# Patient Record
Sex: Female | Born: 1955
Health system: Southern US, Community
[De-identification: ages and names within clinical notes are randomized; demographics above are authoritative.]

## PROBLEM LIST (undated history)

## (undated) DIAGNOSIS — E785 Hyperlipidemia, unspecified: Secondary | ICD-10-CM

## (undated) DIAGNOSIS — Z860100 Personal history of colon polyps, unspecified: Secondary | ICD-10-CM

## (undated) DIAGNOSIS — K219 Gastro-esophageal reflux disease without esophagitis: Secondary | ICD-10-CM

## (undated) DIAGNOSIS — R3915 Urgency of urination: Secondary | ICD-10-CM

## (undated) DIAGNOSIS — F329 Major depressive disorder, single episode, unspecified: Secondary | ICD-10-CM

## (undated) DIAGNOSIS — C801 Malignant (primary) neoplasm, unspecified: Secondary | ICD-10-CM

## (undated) DIAGNOSIS — E079 Disorder of thyroid, unspecified: Secondary | ICD-10-CM

## (undated) DIAGNOSIS — G8929 Other chronic pain: Secondary | ICD-10-CM

## (undated) DIAGNOSIS — I10 Essential (primary) hypertension: Secondary | ICD-10-CM

## (undated) DIAGNOSIS — R351 Nocturia: Secondary | ICD-10-CM

## (undated) DIAGNOSIS — C3491 Malignant neoplasm of unspecified part of right bronchus or lung: Secondary | ICD-10-CM

## (undated) DIAGNOSIS — F32A Depression, unspecified: Secondary | ICD-10-CM

## (undated) DIAGNOSIS — G4700 Insomnia, unspecified: Secondary | ICD-10-CM

## (undated) DIAGNOSIS — R350 Frequency of micturition: Secondary | ICD-10-CM

## (undated) HISTORY — PX: ABDOMINAL HYSTERECTOMY: SHX81

## (undated) HISTORY — DX: Gastro-esophageal reflux disease without esophagitis: K21.9

## (undated) HISTORY — DX: Essential (primary) hypertension: I10

## (undated) HISTORY — PX: COLONOSCOPY: SHX174

## (undated) HISTORY — PX: HALLUX VALGUS CORRECTION: SUR315

## (undated) HISTORY — DX: Malignant neoplasm of unspecified part of right bronchus or lung: C34.91

## (undated) HISTORY — PX: ABDOMINAL SURGERY: SHX537

## (undated) HISTORY — PX: VAGINA RECONSTRUCTION SURGERY: SHX828

## (undated) HISTORY — PX: ESOPHAGOGASTRODUODENOSCOPY: SHX1529

## (undated) HISTORY — DX: Disorder of thyroid, unspecified: E07.9

## (undated) HISTORY — DX: Other chronic pain: G89.29

## (undated) HISTORY — DX: Hyperlipidemia, unspecified: E78.5

---

## 2011-11-07 ENCOUNTER — Other Ambulatory Visit (HOSPITAL_COMMUNITY): Payer: Self-pay | Admitting: Urology

## 2011-11-12 ENCOUNTER — Ambulatory Visit (HOSPITAL_COMMUNITY)
Admission: RE | Admit: 2011-11-12 | Discharge: 2011-11-12 | Disposition: A | Discharge status: Home / Self Care | Payer: Managed Care, Other (non HMO) | Source: Ambulatory Visit | Attending: Urology | Admitting: Urology

## 2011-11-12 ENCOUNTER — Encounter (HOSPITAL_COMMUNITY): Payer: Self-pay

## 2011-11-12 DIAGNOSIS — R319 Hematuria, unspecified: Secondary | ICD-10-CM | POA: Insufficient documentation

## 2011-11-12 DIAGNOSIS — R9389 Abnormal findings on diagnostic imaging of other specified body structures: Secondary | ICD-10-CM | POA: Insufficient documentation

## 2011-11-12 DIAGNOSIS — R109 Unspecified abdominal pain: Secondary | ICD-10-CM | POA: Insufficient documentation

## 2011-11-12 DIAGNOSIS — D3500 Benign neoplasm of unspecified adrenal gland: Secondary | ICD-10-CM | POA: Insufficient documentation

## 2011-11-12 MED ORDER — IOHEXOL 300 MG/ML  SOLN
125.0000 mL | Freq: Once | INTRAMUSCULAR | Status: AC | PRN
  Administered 2011-11-12: 125 mL via INTRAVENOUS

## 2011-11-19 ENCOUNTER — Encounter (HOSPITAL_COMMUNITY): Payer: Self-pay | Admitting: Pharmacy Technician

## 2011-11-19 NOTE — Patient Instructions (Addendum)
[name]  11/19/2011   Your procedure is scheduled on:   11/22/2011  Report to [facility] at  730  AM.  Call this number if you have problems the morning of surgery: [phone]   Remember:   Do not eat food:After Midnight.  May have clear liquids:until Midnight .  Clear liquids include soda, tea, black coffee, apple or grape juice, broth.  Take these medicines the morning of surgery with A SIP OF WATER: lisinopril   Do not wear jewelry, make-up or nail polish.  Do not wear lotions, powders, or perfumes. You may wear deodorant.  Do not shave 48 hours prior to surgery.  Do not bring valuables to the hospital.  Contacts, dentures or bridgework may not be worn into surgery.  Leave suitcase in the car. After surgery it may be brought to your room.  For patients admitted to the hospital, checkout time is 11:00 AM the day of discharge.   Patients discharged the day of surgery will not be allowed to drive home.  Name and phone number of your driver: family  Special Instructions: CHG Shower Use Special Wash: 1/2 bottle night before surgery and 1/2 bottle morning of surgery.   Please read over the following fact sheets that you were given: Coughing and Deep Breathing, MRSA Information, Surgical Site Infection Prevention, Anesthesia Post-op Instructions and Care and Recovery After Surgery PATIENT INSTRUCTIONS POST-ANESTHESIA  IMMEDIATELY FOLLOWING SURGERY:  Do not drive or operate machinery for the first twenty four hours after surgery.  Do not make any important decisions for twenty four hours after surgery or while taking narcotic pain medications or sedatives.  If you develop intractable nausea and vomiting or a severe headache please notify your doctor immediately.  FOLLOW-UP:  Please make an appointment with your surgeon as instructed. You do not need to follow up with anesthesia unless specifically instructed to do so.  WOUND CARE INSTRUCTIONS (if applicable):  Keep a dry clean  dressing on the anesthesia/puncture wound site if there is drainage.  Once the wound has quit draining you may leave it open to air.  Generally you should leave the bandage intact for twenty four hours unless there is drainage.  If the epidural site drains for more than 36-48 hours please call the anesthesia department.  QUESTIONS?:  Please feel free to call your physician or the hospital operator if you have any questions, and they will be happy to assist you.     [facility] Anesthesia Department [address] [place] [phone]

## 2011-11-20 ENCOUNTER — Encounter (HOSPITAL_COMMUNITY): Payer: Self-pay

## 2011-11-20 ENCOUNTER — Other Ambulatory Visit: Payer: Self-pay

## 2011-11-20 ENCOUNTER — Encounter (HOSPITAL_COMMUNITY)
Admission: RE | Admit: 2011-11-20 | Discharge: 2011-11-20 | Disposition: A | Discharge status: Home / Self Care | Payer: Managed Care, Other (non HMO) | Source: Ambulatory Visit | Attending: Urology | Admitting: Urology

## 2011-11-20 LAB — BASIC METABOLIC PANEL WITH GFR
BUN: 20 mg/dL (ref 6–23)
CO2: 25 meq/L (ref 19–32)
Calcium: 9.5 mg/dL (ref 8.4–10.5)
Chloride: 104 meq/L (ref 96–112)
Creatinine, Ser: 0.58 mg/dL (ref 0.50–1.10)
GFR calc Af Amer: >90 mL/min (ref >90)
GFR calc non Af Amer: >90 mL/min (ref >90)
Glucose, Bld: 156 mg/dL — ABNORMAL HIGH (ref 70–99)
Potassium: 4.2 meq/L (ref 3.5–5.1)
Sodium: 139 meq/L (ref 135–145)

## 2011-11-20 LAB — HEMOGLOBIN AND HEMATOCRIT, BLOOD
HCT: 40.8 % (ref 36.0–46.0)
Hemoglobin: 13.3 g/dL (ref 12.0–15.0)

## 2011-11-20 LAB — SURGICAL PCR SCREEN
MRSA, PCR: NEGATIVE
Staphylococcus aureus: NEGATIVE

## 2011-11-20 NOTE — Pre-Procedure Instructions (Signed)
Patient has letter from attorney stating how to process removed tissue for preservation. Letter shown to C. Annabell Howells RN, copy also in chart.

## 2011-11-22 ENCOUNTER — Inpatient Hospital Stay (HOSPITAL_COMMUNITY): Payer: Managed Care, Other (non HMO) | Admitting: Anesthesiology

## 2011-11-22 ENCOUNTER — Encounter (HOSPITAL_COMMUNITY)
Admission: RE | Disposition: A | Discharge status: Home / Self Care | Payer: Self-pay | Source: Ambulatory Visit | Attending: Urology

## 2011-11-22 ENCOUNTER — Ambulatory Visit (HOSPITAL_COMMUNITY)
Admission: RE | Admit: 2011-11-22 | Discharge: 2011-11-23 | Disposition: A | Discharge status: Home / Self Care | Payer: Managed Care, Other (non HMO) | Source: Ambulatory Visit | Attending: Urology | Admitting: Urology

## 2011-11-22 ENCOUNTER — Encounter (HOSPITAL_COMMUNITY): Payer: Self-pay | Admitting: Anesthesiology

## 2011-11-22 ENCOUNTER — Encounter (HOSPITAL_COMMUNITY): Payer: Self-pay | Admitting: *Deleted

## 2011-11-22 DIAGNOSIS — Z01812 Encounter for preprocedural laboratory examination: Secondary | ICD-10-CM | POA: Insufficient documentation

## 2011-11-22 DIAGNOSIS — Z79899 Other long term (current) drug therapy: Secondary | ICD-10-CM | POA: Insufficient documentation

## 2011-11-22 DIAGNOSIS — T8389XA Other specified complication of genitourinary prosthetic devices, implants and grafts, initial encounter: Secondary | ICD-10-CM | POA: Insufficient documentation

## 2011-11-22 DIAGNOSIS — N210 Calculus in bladder: Principal | ICD-10-CM | POA: Insufficient documentation

## 2011-11-22 DIAGNOSIS — I10 Essential (primary) hypertension: Secondary | ICD-10-CM | POA: Insufficient documentation

## 2011-11-22 DIAGNOSIS — E119 Type 2 diabetes mellitus without complications: Secondary | ICD-10-CM | POA: Insufficient documentation

## 2011-11-22 DIAGNOSIS — Y838 Other surgical procedures as the cause of abnormal reaction of the patient, or of later complication, without mention of misadventure at the time of the procedure: Secondary | ICD-10-CM | POA: Insufficient documentation

## 2011-11-22 DIAGNOSIS — Z01810 Encounter for preprocedural cardiovascular examination: Secondary | ICD-10-CM | POA: Insufficient documentation

## 2011-11-22 HISTORY — PX: CYSTOSTOMY: SHX155

## 2011-11-22 LAB — GLUCOSE, CAPILLARY
Glucose-Capillary: 151 mg/dL — ABNORMAL HIGH (ref 70–99)
Glucose-Capillary: 139 mg/dL — ABNORMAL HIGH (ref 70–99)
Glucose-Capillary: 176 mg/dL — ABNORMAL HIGH (ref 70–99)
Glucose-Capillary: 145 mg/dL — ABNORMAL HIGH (ref 70–99)

## 2011-11-22 SURGERY — TRANSVAGINAL TAPE (TVT) REMOVAL
Anesthesia: General | Site: Bladder | Wound class: Clean Contaminated

## 2011-11-22 MED ORDER — GLYCOPYRROLATE 0.2 MG/ML IJ SOLN
INTRAMUSCULAR | Status: AC
  Filled 2011-11-22: qty 1

## 2011-11-22 MED ORDER — STERILE WATER FOR IRRIGATION IR SOLN
Status: DC | PRN
  Administered 2011-11-22: 1000 mL

## 2011-11-22 MED ORDER — HYDROMORPHONE HCL PF 1 MG/ML IJ SOLN
1.0000 mg | INTRAMUSCULAR | Status: DC | PRN
  Administered 2011-11-22: 1 mg via INTRAVENOUS
  Filled 2011-11-22: qty 1

## 2011-11-22 MED ORDER — MIDAZOLAM HCL 2 MG/2ML IJ SOLN
1.0000 mg | INTRAMUSCULAR | Status: DC | PRN
  Administered 2011-11-22: 2 mg via INTRAVENOUS

## 2011-11-22 MED ORDER — CIPROFLOXACIN IN D5W 200 MG/100ML IV SOLN
INTRAVENOUS | Status: AC
  Filled 2011-11-22: qty 100

## 2011-11-22 MED ORDER — CIPROFLOXACIN HCL 250 MG PO TABS
250.0000 mg | ORAL_TABLET | Freq: Two times a day (BID) | ORAL | Status: DC
  Administered 2011-11-22 – 2011-11-23 (×2): 250 mg via ORAL
  Filled 2011-11-22 (×2): qty 1

## 2011-11-22 MED ORDER — 0.9 % SODIUM CHLORIDE (POUR BTL) OPTIME
TOPICAL | Status: DC | PRN
  Administered 2011-11-22: 1000 mL

## 2011-11-22 MED ORDER — METFORMIN HCL 500 MG PO TABS
500.0000 mg | ORAL_TABLET | Freq: Two times a day (BID) | ORAL | Status: DC
  Administered 2011-11-22 – 2011-11-23 (×3): 500 mg via ORAL
  Filled 2011-11-22 (×3): qty 1

## 2011-11-22 MED ORDER — ROCURONIUM BROMIDE 50 MG/5ML IV SOLN
INTRAVENOUS | Status: AC
  Filled 2011-11-22: qty 1

## 2011-11-22 MED ORDER — FENTANYL CITRATE 0.05 MG/ML IJ SOLN
INTRAMUSCULAR | Status: AC
  Filled 2011-11-22: qty 5

## 2011-11-22 MED ORDER — CIPROFLOXACIN IN D5W 200 MG/100ML IV SOLN
200.0000 mg | Freq: Two times a day (BID) | INTRAVENOUS | Status: AC
  Administered 2011-11-22: 200 mg via INTRAVENOUS
  Filled 2011-11-22: qty 100

## 2011-11-22 MED ORDER — BUPIVACAINE IN DEXTROSE 0.75-8.25 % IT SOLN
INTRATHECAL | Status: AC
  Filled 2011-11-22: qty 2

## 2011-11-22 MED ORDER — ONDANSETRON HCL 4 MG/2ML IJ SOLN
INTRAMUSCULAR | Status: AC
  Filled 2011-11-22: qty 2

## 2011-11-22 MED ORDER — LACTATED RINGERS IV SOLN
INTRAVENOUS | Status: DC
  Administered 2011-11-22 (×2): via INTRAVENOUS

## 2011-11-22 MED ORDER — LIDOCAINE HCL 1 % IJ SOLN
INTRAMUSCULAR | Status: DC | PRN
  Administered 2011-11-22: 40 mg via INTRADERMAL

## 2011-11-22 MED ORDER — SODIUM CHLORIDE 0.45 % IV SOLN
INTRAVENOUS | Status: DC
  Administered 2011-11-22 – 2011-11-23 (×3): via INTRAVENOUS

## 2011-11-22 MED ORDER — ONDANSETRON HCL 4 MG/2ML IJ SOLN: 4.0000 mg | Freq: Once | INTRAMUSCULAR | Status: DC | PRN

## 2011-11-22 MED ORDER — ONDANSETRON HCL 4 MG/2ML IJ SOLN
4.0000 mg | Freq: Once | INTRAMUSCULAR | Status: AC
  Administered 2011-11-22: 4 mg via INTRAVENOUS

## 2011-11-22 MED ORDER — CIPROFLOXACIN IN D5W 400 MG/200ML IV SOLN
INTRAVENOUS | Status: DC | PRN
  Administered 2011-11-22: 200 mg via INTRAVENOUS

## 2011-11-22 MED ORDER — BACITRACIN-NEOMYCIN-POLYMYXIN 400-5-5000 EX OINT
TOPICAL_OINTMENT | CUTANEOUS | Status: AC
  Filled 2011-11-22: qty 2

## 2011-11-22 MED ORDER — FENTANYL CITRATE 0.05 MG/ML IJ SOLN: 25.0000 ug | INTRAMUSCULAR | Status: DC | PRN

## 2011-11-22 MED ORDER — FENTANYL CITRATE 0.05 MG/ML IJ SOLN
INTRAMUSCULAR | Status: DC | PRN
  Administered 2011-11-22: 50 ug via INTRAVENOUS
  Administered 2011-11-22: 100 ug via INTRAVENOUS
  Administered 2011-11-22 (×2): 50 ug via INTRAVENOUS
  Administered 2011-11-22 (×2): 100 ug via INTRAVENOUS
  Administered 2011-11-22 (×3): 50 ug via INTRAVENOUS

## 2011-11-22 MED ORDER — NEOSTIGMINE METHYLSULFATE 1 MG/ML IJ SOLN
INTRAMUSCULAR | Status: DC | PRN
  Administered 2011-11-22: 3 mg via INTRAVENOUS

## 2011-11-22 MED ORDER — PROPOFOL 10 MG/ML IV BOLUS
INTRAVENOUS | Status: DC | PRN
  Administered 2011-11-22: 160 mg via INTRAVENOUS

## 2011-11-22 MED ORDER — MIDAZOLAM HCL 5 MG/5ML IJ SOLN
INTRAMUSCULAR | Status: DC | PRN
  Administered 2011-11-22: 2 mg via INTRAVENOUS

## 2011-11-22 MED ORDER — MIDAZOLAM HCL 2 MG/2ML IJ SOLN
INTRAMUSCULAR | Status: AC
  Filled 2011-11-22: qty 2

## 2011-11-22 MED ORDER — LISINOPRIL 5 MG PO TABS
5.0000 mg | ORAL_TABLET | Freq: Every day | ORAL | Status: DC
  Administered 2011-11-22 – 2011-11-23 (×2): 5 mg via ORAL
  Filled 2011-11-22 (×2): qty 1

## 2011-11-22 MED ORDER — PROPOFOL 10 MG/ML IV EMUL
INTRAVENOUS | Status: AC
  Filled 2011-11-22: qty 20

## 2011-11-22 MED ORDER — GLYCOPYRROLATE 0.2 MG/ML IJ SOLN
INTRAMUSCULAR | Status: DC | PRN
  Administered 2011-11-22: 0.6 mg via INTRAVENOUS

## 2011-11-22 MED ORDER — ROCURONIUM BROMIDE 100 MG/10ML IV SOLN
INTRAVENOUS | Status: DC | PRN
  Administered 2011-11-22 (×2): 10 mg via INTRAVENOUS
  Administered 2011-11-22: 20 mg via INTRAVENOUS
  Administered 2011-11-22: 40 mg via INTRAVENOUS
  Administered 2011-11-22: 10 mg via INTRAVENOUS

## 2011-11-22 MED ORDER — SODIUM CHLORIDE 0.9 % IR SOLN
Status: DC | PRN
  Administered 2011-11-22: 3000 mL

## 2011-11-22 MED ORDER — BIOTENE DRY MOUTH MT LIQD
15.0000 mL | Freq: Two times a day (BID) | OROMUCOSAL | Status: DC
  Administered 2011-11-22: 15 mL via OROMUCOSAL

## 2011-11-22 MED ORDER — ONDANSETRON HCL 4 MG/2ML IJ SOLN
4.0000 mg | Freq: Four times a day (QID) | INTRAMUSCULAR | Status: DC | PRN
  Administered 2011-11-22: 4 mg via INTRAVENOUS
  Filled 2011-11-22: qty 2

## 2011-11-22 MED ORDER — FENTANYL CITRATE 0.05 MG/ML IJ SOLN
INTRAMUSCULAR | Status: AC
  Filled 2011-11-22: qty 2

## 2011-11-22 MED ORDER — CIPROFLOXACIN IN D5W 200 MG/100ML IV SOLN: 200.0000 mg | Freq: Once | INTRAVENOUS | Status: DC

## 2011-11-22 SURGICAL SUPPLY — 59 items
BAG DRAIN URO TABLE W/ADPT NS (DRAPE) IMPLANT
BAG URINE DRAINAGE (UROLOGICAL SUPPLIES) ×2 IMPLANT
BLADE SURG SZ10 CARB STEEL (BLADE) ×2 IMPLANT
CATH FOLEY 2WAY SLVR  5CC 18FR (CATHETERS) ×1
CATH FOLEY 2WAY SLVR  5CC 20FR (CATHETERS)
CATH FOLEY 2WAY SLVR 5CC 18FR (CATHETERS) ×1 IMPLANT
CATH FOLEY 2WAY SLVR 5CC 20FR (CATHETERS) IMPLANT
CLOTH BEACON ORANGE TIMEOUT ST (SAFETY) ×2 IMPLANT
CONNECTOR 5 IN 1 STRAIGHT STRL (MISCELLANEOUS) ×4 IMPLANT
COVER SURGICAL LIGHT HANDLE (MISCELLANEOUS) ×4 IMPLANT
DRAIN WOUND SHIRLEY 16F DUAL (DRAIN) ×2 IMPLANT
DRAPE PROXIMA HALF (DRAPES) ×2 IMPLANT
ELECT BLADE 6 FLAT ULTRCLN (ELECTRODE) ×2 IMPLANT
ELECT REM PT RETURN 9FT ADLT (ELECTROSURGICAL) ×2
ELECTRODE REM PT RTRN 9FT ADLT (ELECTROSURGICAL) ×1 IMPLANT
GLOVE BIO SURGEON STRL SZ7 (GLOVE) ×2 IMPLANT
GLOVE BIO SURGEON STRL SZ7.5 (GLOVE) ×2 IMPLANT
GLOVE BIOGEL PI IND STRL 7.0 (GLOVE) ×2 IMPLANT
GLOVE BIOGEL PI IND STRL 7.5 (GLOVE) ×1 IMPLANT
GLOVE BIOGEL PI INDICATOR 7.0 (GLOVE) ×2
GLOVE BIOGEL PI INDICATOR 7.5 (GLOVE) ×1
GLOVE ECLIPSE 6.5 STRL STRAW (GLOVE) ×8 IMPLANT
GLOVE ECLIPSE 7.0 STRL STRAW (GLOVE) ×4 IMPLANT
GLOVE EXAM NITRILE MD LF STRL (GLOVE) ×2 IMPLANT
GOWN STRL REIN XL XLG (GOWN DISPOSABLE) ×8 IMPLANT
INST SET MINOR GENERAL (KITS) ×2 IMPLANT
IV NS IRRIG 3000ML ARTHROMATIC (IV SOLUTION) ×2 IMPLANT
KIT ROOM TURNOVER AP CYSTO (KITS) ×2 IMPLANT
LUBRICANT JELLY 4.5OZ STERILE (MISCELLANEOUS) ×2 IMPLANT
MANIFOLD NEPTUNE II (INSTRUMENTS) ×2 IMPLANT
NEEDLE HYPO 18GX1.5 BLUNT FILL (NEEDLE) ×2 IMPLANT
NEEDLE HYPO 22GX1.5 SAFETY (NEEDLE) IMPLANT
NS IRRIG 1000ML POUR BTL (IV SOLUTION) ×2 IMPLANT
PACK PERI GYN (CUSTOM PROCEDURE TRAY) ×2 IMPLANT
PAD ARMBOARD 7.5X6 YLW CONV (MISCELLANEOUS) ×2 IMPLANT
SET BASIN LINEN APH (SET/KITS/TRAYS/PACK) ×2 IMPLANT
SET IRRIG Y TYPE TUR BLADDER L (SET/KITS/TRAYS/PACK) ×2 IMPLANT
SPONGE GAUZE 4X4 12PLY (GAUZE/BANDAGES/DRESSINGS) ×2 IMPLANT
SPONGE INTESTINAL PEANUT (DISPOSABLE) ×2 IMPLANT
SPONGE LAP 18X18 X RAY DECT (DISPOSABLE) ×6 IMPLANT
STAPLER VISISTAT (STAPLE) ×2 IMPLANT
STAPLER VISISTAT 35W (STAPLE) ×2 IMPLANT
SUT CHROMIC 3 0 SH 27 (SUTURE) ×2 IMPLANT
SUT PLAIN 2 0 XLH (SUTURE) ×2 IMPLANT
SUT SILK 0 FSL (SUTURE) ×2 IMPLANT
SUT VIC AB 0 CT1 27 (SUTURE) ×2
SUT VIC AB 0 CT1 27XCR 8 STRN (SUTURE) ×2 IMPLANT
SUT VIC AB 2-0 CT1 27 (SUTURE) ×6
SUT VIC AB 2-0 CT1 TAPERPNT 27 (SUTURE) ×6 IMPLANT
SUT VIC AB 2-0 CT2 27 (SUTURE) ×12 IMPLANT
SUT VIC AB 3-0 SH 27 (SUTURE) ×2
SUT VIC AB 3-0 SH 27X BRD (SUTURE) ×2 IMPLANT
SYR BULB IRRIGATION 50ML (SYRINGE) ×2 IMPLANT
SYR CONTROL 10ML LL (SYRINGE) IMPLANT
SYRINGE 10CC LL (SYRINGE) ×2 IMPLANT
TAPE CLOTH SURG 4X10 WHT LF (GAUZE/BANDAGES/DRESSINGS) ×2 IMPLANT
WATER STERILE IRR 1000ML POUR (IV SOLUTION) ×2 IMPLANT
YANKAUER SUCT 12FT TUBE ARGYLE (SUCTIONS) ×2 IMPLANT
YANKAUER SUCT BULB TIP NO VENT (SUCTIONS) ×2 IMPLANT

## 2011-11-22 NOTE — H&P (Signed)
[name], [name]              ACCOUNT NO.:  [id number]  MEDICAL RECORD NO.:  [id number]  LOCATION:                                 FACILITY:  PHYSICIAN:  Ky Barban, M.D.DATE OF BIRTH:  02/08/1955  DATE OF ADMISSION:  11/22/2011 DATE OF DISCHARGE:  LH                             HISTORY & PHYSICAL   CHIEF COMPLAINT:  Bladder stone.  HISTORY:  A 56 year old female had tension-free vaginal tape done in [place] in 2006 and it was done for stress incontinence.  She does not have any stress incontinence anymore.  She has been seeing gross blood in her urine and having severe dysuria, feels discomfort in the back and the vaginal area on and off.  It used to be on and off, now it is continuous.  For the last couple of months, she is very miserable.  She feels a lot of pressure in the pelvic.  Her gynecologist have checked her a couple of years ago and that was her last examination.  He told her to see a urologist.  She does not have any stress incontinence anymore.  No fever or chills.  I did a cystoscopy in the office.  There is a stone sticking to her left bladder wall, and I supposed that left limb of the tension-free tape has eroded into the bladder and there is a calcium deposit.  It is a rather large stone.  It is almost 2 cm.  So I have advised her to undergo removal of this side of the limb of the tension-free vaginal tape along with the stone.  Procedure, its limitation, complications are discussed in detail.  I told her that I am going to remove just the side of the tape most likely.  It should not interfere with her in stress incontinence, but there is no guarantee that it will keep working.  Also I have to open the bladder to remove this thing and there can be prolonged leakage of urine from the cystotomy site.  She understands and want me to go ahead and proceed. She is coming as outpatient to have it done under anesthesia, and I will keep her overnight in the  hospital.  PAST MEDICAL HISTORY:  Otherwise negative.  She had a hysterectomy for benign disease done in 2001.  She does have noninsulin-dependent diabetes and hypertension.  PERSONAL HISTORY:  She does not smoke or drink, use illicit drugs.  REVIEW OF SYSTEMS:  Denies any chest pain, orthopnea, PND, nausea, vomiting, fever, chills.  PHYSICAL EXAMINATION:  GENERAL:  Moderately obese female, not in acute distress. VITAL SIGNS:  Blood pressure 130/80, temperature is normal. CENTRAL NERVOUS SYSTEM:  No gross neurological deficit. HEAD, NECK, EYES, ENT:  Negative. CHEST:  Symmetrical.  Normal breath sounds. HEART:  Regular sinus rhythm.  No murmur heard. ABDOMEN:  Soft, flat.  Liver, spleen, kidneys are not palpable.  No CVA tenderness.  PELVIC:  Painful.  IMPRESSION:  Bladder stone with most likely erosion of the tension-free vaginal tape into the bladder with stone formation.  PLAN:  Removal of the left limb of the tape along with the bladder stone.     Ky Barban, M.D.  MIJ/MEDQ  D:  11/21/2011  T:  11/21/2011  Job:  [id number]

## 2011-11-22 NOTE — Transfer of Care (Signed)
Immediate Anesthesia Transfer of Care Note  Patient: [name]  Procedure(s) Performed: Procedure(s) (LRB): TRANSVAGINAL TAPE (TVT) REMOVAL (N/A) CYSTOSTOMY SUPRAPUBIC (N/A)  Patient Location: PACU and NICU  Anesthesia Type: General  Level of Consciousness: awake and patient cooperative  Airway & Oxygen Therapy: Patient Spontanous Breathing and Patient connected to face mask oxygen  Post-op Assessment: Report given to PACU RN, Post -op Vital signs reviewed and stable and Patient moving all extremities  Post vital signs: Reviewed and stable  Complications: No apparent anesthesia complications

## 2011-11-22 NOTE — OR Nursing (Signed)
Pt. Toe rings removed .  She had 2 gold toe rings,  Given to friend Greggory Stallion

## 2011-11-22 NOTE — Op Note (Signed)
NAMELAKEITHIA, RASOR              ACCOUNT NO.:  [id number]  MEDICAL RECORD NO.:  [id number]  LOCATION:  A327                          FACILITY:  [facility]  PHYSICIAN:  Ky Barban, M.D.DATE OF BIRTH:  02/08/1955  DATE OF PROCEDURE: DATE OF DISCHARGE:                              OPERATIVE REPORT   PREOPERATIVE DIAGNOSIS:  Foreign body bladder with stone formation.  PROCEDURE:  Excision of left limb of TVT with cystotomy and removal of the stone along with TVT, left limb.  ANESTHESIA:  General.  DESCRIPTION OF PROCEDURE:  The patient under general endotracheal anesthesia, in supine position, usual prep and drape.  A suprapubic midline incision was made about 3 inches long, carried down through the subcutaneous tissue, and the rectus sheath incised in the line of the incision, recti separated in the midline, and the bladder was filled up with 300 mL of saline through already placed Foley catheter.  After identifying the bladder, the superior part of the perivesical tissue was pushed superiorly to push the peritoneum away from the bladder.  Then with blunt dissection, I was able to feel the left limb when it comes near the back of the pubic symphysis and I held it with a long hemostat and cystotomy was made in the line of that incision between two 2-0 Vicryl stitches with the help of the cautery.  Once the bladder was opened up, inside of the bladder was inspected.  Then I can see there is a large stone sitting.  It is stuck to the left bladder wall.  I carried the cystotomy towards the stone and I had already divided the attachment of the left limb of the TVT to the bladder and it was being held by [facility] clamp.  With blunt and sharp dissection going around the area where the stone is, cystotomy incision was carried around the TVT where it is entering into the bladder.  Once the incision was completed, the stone along the TVT was removed and easily I can see the stone is  formed on the TVT.  At this point, the kidney cystotomy site was closed with and will put interrupted sutures of 2-0 Vicryl, starting from the most inferior part of the cystotomy.  It was carefully closed.  The cystotomy was closed with interrupted 2-0 Vicryl stitches and at the end few stitches, I had to run the stitch to close the cystotomy and the bladder was filled up again to see if there is any leak. There was no major leak from the cystotomy site.  There was some oozing here and there which was closed by the putting some more additional stitches to close it water tight.  Once the water tight closure was obtained, the perivesical tissue was approximated with couple of 3 stitches of 2-0 Vicryl and bladder was drained completely and it was irrigated.  The retropubic space was irrigated with saline.  There was no bleeding and there was no leakage of urine from the bladder.  The Foley catheter was draining fine.  Then I closed the rectus sheath with interrupted sutures of 2-0 Vicryl.  Before closing it, I had placed a Shirley sump in the retropubic space next to  the bladder coming out through a separate stab wound and stabilized to the skin with a silk stitch.  Rectus sheath was closed with interrupted sutures of 2-0 Vicryl.  Subcutaneous tissue was closed with 3-0 plain interrupted sutures.  The wound was thoroughly irrigated.  The subcutaneous tissue was irrigated with saline before closing it.  Skin was approximated with staples.  Sterile gauze dressing applied.  I had applied Neosporin to the incision.  The patient was not bleeding.  The Foley catheter was draining.  Shirley sump was working fine.  All the instrument, needle sponge count was correct.  At this time, sterile gauze dressing was applied.  The patient left the operating room in satisfactory condition.     Ky Barban, M.D.     MIJ/MEDQ  D:  11/22/2011  T:  11/22/2011  Job:  [id number]

## 2011-11-22 NOTE — Anesthesia Postprocedure Evaluation (Signed)
  Anesthesia Post-op Note  Patient: [name]  Procedure(s) Performed: Procedure(s) (LRB): TRANSVAGINAL TAPE (TVT) REMOVAL (N/A) CYSTOSTOMY SUPRAPUBIC (N/A)  Patient Location: PACU  Anesthesia Type: General  Level of Consciousness: awake, alert , oriented and patient cooperative  Airway and Oxygen Therapy: Patient Spontanous Breathing  Post-op Pain: 3 /10, mild  Post-op Assessment: Post-op Vital signs reviewed, Patient's Cardiovascular Status Stable, Respiratory Function Stable, Patent Airway, No signs of Nausea or vomiting and Pain level controlled  Post-op Vital Signs: Reviewed and stable  Complications: No apparent anesthesia complications

## 2011-11-22 NOTE — Progress Notes (Signed)
No change in H&P on reexamination. 

## 2011-11-22 NOTE — Anesthesia Preprocedure Evaluation (Addendum)
Anesthesia Evaluation  Patient identified by MRN, date of birth, ID band Patient awake    Reviewed: Allergy & Precautions, H&P , NPO status , Patient's Chart, lab work & pertinent test results  History of Anesthesia Complications Negative for: history of anesthetic complications  Airway Mallampati: III TM Distance: >3 FB Neck ROM: Full    Dental  (+) Teeth Intact   Pulmonary neg pulmonary ROS, former smoker breath sounds clear to auscultation        Cardiovascular hypertension, Pt. on medications Rhythm:Regular     Neuro/Psych    GI/Hepatic   Endo/Other  Diabetes mellitus-, Well Controlled, Type 2, Oral Hypoglycemic Agents  Renal/GU      Musculoskeletal   Abdominal   Peds  Hematology   Anesthesia Other Findings   Reproductive/Obstetrics                           Anesthesia Physical Anesthesia Plan  ASA: II  Anesthesia Plan: General   Post-op Pain Management:    Induction: Intravenous  Airway Management Planned: Oral ETT  Additional Equipment:   Intra-op Plan:   Post-operative Plan: Extubation in OR  Informed Consent: I have reviewed the patients History and Physical, chart, labs and discussed the procedure including the risks, benefits and alternatives for the proposed anesthesia with the patient or authorized representative who has indicated his/her understanding and acceptance.     Plan Discussed with:   Anesthesia Plan Comments:         Anesthesia Quick Evaluation

## 2011-11-22 NOTE — OR Nursing (Signed)
Procedure comment: Exploration & Removal of stone formation & left limb of Transvaginal tape per MD. Specimen sent to Valene Bors in lab; Lab/[facility] is sending specimen in 10% formalin per instructions from attorney's office to:  [employer] RE: Sherryle Lis [facility] [address] [place] [address]

## 2011-11-22 NOTE — Brief Op Note (Signed)
11/22/2011  11:47 AM  PATIENT:  [name]  56 y.o. female  PRE-OPERATIVE DIAGNOSIS:  erosion TVT   POST-OPERATIVE DIAGNOSIS:  * No post-op diagnosis entered *  PROCEDURE:  Procedure(s) (LRB): TRANSVAGINAL TAPE (TVT) REMOVAL (N/A) CYSTOSTOMY SUPRAPUBIC (N/A)  SURGEON:  Surgeon(s) and Role:    * Ky Barban, MD - Primary  PHYSICIAN ASSISTANT:   ASSISTANTS: none   ANESTHESIA:   general  EBL:  Total I/O In: 1500 [I.V.:1500] Out: -   BLOOD ADMINISTERED:none  DRAINS: Urinary Catheter (Foley)   LOCAL MEDICATIONS USED:  NONE  SPECIMEN:  Source of Specimen:  l limb of tvt with stone  DISPOSITION OF SPECIMEN:  PATHOLOGY  COUNTS:  YES  TOURNIQUET:  * No tourniquets in log *  DICTATION: .Other Dictation: Dictation Number [id number]  PLAN OF CARE: Admit for overnight observation  PATIENT DISPOSITION:  PACU - hemodynamically stable.   Delay start of Pharmacological VTE agent (>24hrs) due to surgical blood loss or risk of bleeding:

## 2011-11-22 NOTE — Anesthesia Procedure Notes (Signed)
Procedure Name: Intubation Date/Time: 11/22/2011 9:24 AM Performed by: Despina Hidden Pre-anesthesia Checklist: Suction available, Patient being monitored, Emergency Drugs available, Patient identified and Timeout performed Patient Re-evaluated:Patient Re-evaluated prior to inductionOxygen Delivery Method: Circle system utilized Preoxygenation: Pre-oxygenation with 100% oxygen Intubation Type: IV induction and Cricoid Pressure applied Ventilation: Mask ventilation without difficulty Laryngoscope Size: Mac and 3 Grade View: Grade II Tube type: Oral Tube size: 7.0 mm Number of attempts: 1 Airway Equipment and Method: Stylet and Patient positioned with wedge pillow Placement Confirmation: ETT inserted through vocal cords under direct vision,  positive ETCO2 and breath sounds checked- equal and bilateral Secured at: 20 cm Tube secured with: Tape Dental Injury: Teeth and Oropharynx as per pre-operative assessment

## 2011-11-23 ENCOUNTER — Encounter (HOSPITAL_COMMUNITY): Payer: Self-pay | Admitting: Urology

## 2011-11-23 LAB — GLUCOSE, CAPILLARY
Glucose-Capillary: 154 mg/dL — ABNORMAL HIGH (ref 70–99)
Glucose-Capillary: 154 mg/dL — ABNORMAL HIGH (ref 70–99)

## 2011-11-23 LAB — BASIC METABOLIC PANEL
BUN: 11 mg/dL (ref 6–23)
CO2: 28 mEq/L (ref 19–32)
Calcium: 9.2 mg/dL (ref 8.4–10.5)
Chloride: 104 mEq/L (ref 96–112)
Creatinine, Ser: 0.71 mg/dL (ref 0.50–1.10)
GFR calc Af Amer: >90 mL/min (ref >90)
GFR calc non Af Amer: >90 mL/min (ref >90)
Glucose, Bld: 145 mg/dL — ABNORMAL HIGH (ref 70–99)
Potassium: 4.2 mEq/L (ref 3.5–5.1)
Sodium: 139 mEq/L (ref 135–145)

## 2011-11-23 LAB — CBC
HCT: 40.5 % (ref 36.0–46.0)
Hemoglobin: 13.1 g/dL (ref 12.0–15.0)
MCH: 26 pg (ref 26.0–34.0)
MCHC: 32.3 g/dL (ref 30.0–36.0)
MCV: 80.5 fL (ref 78.0–100.0)
Platelets: 215 10*3/uL (ref 150–400)
RBC: 5.03 MIL/uL (ref 3.87–5.11)
RDW: 14.2 % (ref 11.5–15.5)
WBC: 10.5 10*3/uL (ref 4.0–10.5)

## 2011-11-23 NOTE — Progress Notes (Signed)
IV removed, site WNL.  Pt given d/c instructions and new prescription.  Discussed home care with patient and discussed home medications, patient verbalizes understanding. F/U appointment in place with Dr Jerre Simon, pt states they will keep appointment. Discharge instructions given about foley catheter care, switching from leg bag to gravity drainage bag, emptying bags, peri care.  Also discussed incision care and care of drain site. Pt is able to return demonstration and discuss proper foley care etc. Teachback done. Pt is stable at this time. Pt taken to main entrance in wheelchair by staff member.

## 2011-11-29 ENCOUNTER — Emergency Department (HOSPITAL_COMMUNITY)
Admission: EM | Admit: 2011-11-29 | Discharge: 2011-11-29 | Disposition: A | Discharge status: Home / Self Care | Payer: Managed Care, Other (non HMO) | Attending: Emergency Medicine | Admitting: Emergency Medicine

## 2011-11-29 ENCOUNTER — Other Ambulatory Visit (HOSPITAL_COMMUNITY): Payer: Self-pay | Admitting: Urology

## 2011-11-29 ENCOUNTER — Encounter (HOSPITAL_COMMUNITY): Payer: Self-pay

## 2011-11-29 DIAGNOSIS — IMO0001 Reserved for inherently not codable concepts without codable children: Secondary | ICD-10-CM

## 2011-11-29 DIAGNOSIS — Z90710 Acquired absence of both cervix and uterus: Secondary | ICD-10-CM | POA: Insufficient documentation

## 2011-11-29 DIAGNOSIS — E119 Type 2 diabetes mellitus without complications: Secondary | ICD-10-CM | POA: Insufficient documentation

## 2011-11-29 DIAGNOSIS — T8389XA Other specified complication of genitourinary prosthetic devices, implants and grafts, initial encounter: Secondary | ICD-10-CM

## 2011-11-29 DIAGNOSIS — Z87891 Personal history of nicotine dependence: Secondary | ICD-10-CM | POA: Insufficient documentation

## 2011-11-29 DIAGNOSIS — Z4801 Encounter for change or removal of surgical wound dressing: Secondary | ICD-10-CM | POA: Insufficient documentation

## 2011-11-29 DIAGNOSIS — Z87442 Personal history of urinary calculi: Secondary | ICD-10-CM | POA: Insufficient documentation

## 2011-11-29 NOTE — Discharge Instructions (Signed)
How to Change Your Dressing Follow your doctor's instructions for how often to change the bandage (dressing). HOME CARE Before you begin:  Wash your hands with soap and water. Dry your hands with a clean towel.   Get your supplies together. Things you may need include:   Salt solution (saline).   Flexible gauze bandage.   Medicated cream.   Tape.   Gloves.   Belly (abdominal) pads.   Gauze squares.   Plastic bags.   Take pain medicine 30 minutes before the bandage change if you need it.   Take a shower before you do the first bandage change of the day.  Changing your bandage:   Take off the old bandage. Put it in a plastic bag.   Wash your hands. If someone else takes off your old bandage, they should wash their hands.   Open the supplies.   Take the cap off the salt solution.   Open the gauze. Put the gauze on the inside of the package.   Put on your gloves.   Clean your wound as told by your doctor.   If you have been told to keep your wound dry, follow those instructions.   Your doctor may tell you to do one or more of the following:   Pick up the gauze. Pour the salt solution over the gauze. Squeeze out the extra salt solution.   Put medicated cream or other medicine on your wound if you have been told to do so.   Put solution soaked gauze only in your wound, not on the skin around it.   Pack your wound loosely as told by your doctor.   Put dry gauze on your wound.   Put belly pads over the dry gauze if your bandages soak through.   Take your gloves off. Put them in the plastic bag with the old bandage. Tie the bag shut and throw it away.   Tape the bandage in place so it will not fall off. Do not wrap the tape completely around your arm or leg.   Wrap the bandage with the flexibe gauze bandage as told by your doctor.   Keep the bandage clean and dry until your next bandage change.  GET HELP RIGHT AWAY IF:   Your wound looks red.   Your wound  feels more tender or sore.   You see yellowish white fluid (pus) in the wound.   The wound smells bad.   You have a fever of 102 F (38.9 C) or higher.   The skin around your wound has a red rash that itches and burns.   You see black or yellow skin in your wound that was not there before.   You feel sick to your stomach (nausea), throw up (vomit), and feel very tired.  Document Released: 10/12/2008 Document Revised: 07/05/2011 Document Reviewed: 05/27/2011 [facility] Patient Information 2012 [place], [place].Wound Check Your wound appears healthy today. Your wound will heal gradually over time. Eventually a scar will form that will fade with time. FACTORS THAT AFFECT SCAR FORMATION:  People differ in the severity in which they scar.   Scar severity varies according to location, size, and the traits you inherited from your parents (genetic predisposition).   Irritation to the wound from infection, rubbing, or chemical exposure will increase the amount of scar formation.  HOME CARE INSTRUCTIONS   If you were given a dressing, you should change it at least once a day or as instructed by your  caregiver. If the bandage sticks, soak it off with a solution of hydrogen peroxide.   If the bandage becomes wet, dirty, or develops a bad smell, change it as soon as possible.   Look for signs of infection.   Only take over-the-counter or prescription medicines for pain, discomfort, or fever as directed by your caregiver.  SEEK IMMEDIATE MEDICAL CARE IF:   You have redness, swelling, or increasing pain in the wound.   You notice pus coming from the wound.   You have a fever.   You notice a bad smell coming from the wound or dressing.  Document Released: 04/21/2004 Document Revised: 07/05/2011 Document Reviewed: 07/16/2005 [facility] Patient Information 2012 [place], [place].

## 2011-11-29 NOTE — ED Provider Notes (Signed)
History     CSN: [id number]  Arrival date & time 11/29/11  2440   First MD Initiated Contact with Patient 11/29/11 1955      Chief Complaint  Patient presents with  . Wound Check    (Consider location/radiation/quality/duration/timing/severity/associated sxs/prior treatment) HPI Comments: Patient comes to emergency department with complaint of bleeding from a surgical incision earlier this evening. She states that she had surgery on 11/22/2011 to remove a defective bladder mesh and a stone that was attached to the bladder wall. She states she was seen by her urologist, Dr. Jerre Simon, earlier today and had the staples removed. This evening, she states she noticed a brief episode of bleeding along the incision after having a bowel movement. She states the bleeding stopped prior to arrival in the ER . She denies fever, abdominal pain, or persistent bleeding. Patient also has a Foley catheter in place but denies any difficulty with urination or leaking from her catheter.  Patient is a 56 y.o. female presenting with wound check. The history is provided by the patient.  Wound Check     Past Medical History  Diagnosis Date  . Diabetes mellitus     Past Surgical History  Procedure Date  . Abdominal hysterectomy   . Vagina reconstruction surgery     tvt 2006- [place]  . Hallux valgus correction     bilateral foot surgery for bone repairs-multiple  . Cystostomy 11/22/2011    Procedure: CYSTOSTOMY SUPRAPUBIC;  Surgeon: Ky Barban, MD;  Location: [facility];  Service: Urology;  Laterality: N/A;  . Abdominal surgery     Family History  Problem Relation Age of Onset  . Anesthesia problems Neg Hx   . Hypotension Neg Hx   . Malignant hyperthermia Neg Hx   . Pseudochol deficiency Neg Hx     History  Substance Use Topics  . Smoking status: Former Smoker -- 1.0 packs/day for 20 years    Types: Cigarettes    Quit date: 11/19/2001  . Smokeless tobacco: Former Neurosurgeon    Quit date:  10/17/2011  . Alcohol Use: No     rarely    OB History    Grav Para Term Preterm Abortions TAB SAB Ect Mult Living                  Review of Systems  Constitutional: Negative for fever and chills.  Gastrointestinal: Negative for vomiting, abdominal pain, blood in stool and abdominal distention.  Genitourinary: Negative for dysuria, hematuria and difficulty urinating.  Skin: Positive for wound.       Bleeding from the surgical wound   Neurological: Negative for weakness.  All other systems reviewed and are negative.    Allergies  Synthroid and Iodine  Home Medications   Current Outpatient Rx  Name Route Sig Dispense Refill  . LISINOPRIL 5 MG PO TABS Oral Take 5 mg by mouth every evening.     Marland Kitchen METFORMIN HCL 500 MG PO TABS Oral Take 500 mg by mouth 2 (two) times daily.    Marland Kitchen CIPROFLOXACIN HCL 250 MG PO TABS Oral Take 250 mg by mouth 2 (two) times daily.      BP 156/64  Pulse 75  Temp(Src) 97.8 F (36.6 C) (Oral)  Resp 20  Ht 5' 6.5" (1.689 m)  Wt 220 lb (99.791 kg)  BMI 34.98 kg/m2  SpO2 97%  Physical Exam  Nursing note and vitals reviewed. Constitutional: She is oriented to person, place, and time. She appears well-developed  and well-nourished. No distress.  HENT:  Head: Normocephalic and atraumatic.  Cardiovascular: Normal rate, regular rhythm and normal heart sounds.   Pulmonary/Chest: Breath sounds normal.  Abdominal: Soft. Bowel sounds are normal. She exhibits no distension. There is no tenderness. There is no rebound and no guarding.         Transverse surgical incision to the lower abdomen/suprapubic region.  Steri-Strips in place. Small , localized area of blood present at the lower portion of the incision. No active bleeding at this time. Incision appears to be healing well, no surrounding erythema, drainage, or odor. The wound does not appear to be open.  Musculoskeletal: Normal range of motion. She exhibits no edema.  Neurological: She is alert and  oriented to person, place, and time.  Skin: Skin is dry.       See abdominal exam    ED Course  Procedures (including critical care time)       MDM    The incision was cleaned with saline by me and additional Steri-Strips were applied to the wound. Bleeding is controlled. No surrounding erythema or drainage. Patient has an appointment to followup with Dr. Jerre Simon on Monday. She agrees to return to the ER for any worsening symptoms.    Patient / Family / Caregiver understand and agree with initial ED impression and plan with expectations set for ED visit. Pt stable in ED with no significant deterioration in condition. Pt feels improved after observation and/or treatment in ED.      Taha Dimond L. [place], [place] 11/29/11 2204

## 2011-11-29 NOTE — ED Provider Notes (Signed)
Medical screening examination/treatment/procedure(s) were performed by non-physician practitioner and as supervising physician I was immediately available for consultation/collaboration.  Donnetta Hutching, MD 11/29/11 2241

## 2011-11-29 NOTE — ED Notes (Signed)
Pt with complaints of bleeding in abd after surgery. Today she had staples removed and now bleeding present. Small amount of bleeding noted controlled with 4x4 held by patient.

## 2011-11-29 NOTE — ED Notes (Signed)
Pt seen and evaluated by EDPa for initial assessment. 

## 2011-12-03 ENCOUNTER — Ambulatory Visit (HOSPITAL_COMMUNITY)
Admission: RE | Admit: 2011-12-03 | Discharge: 2011-12-03 | Disposition: A | Discharge status: Home / Self Care | Payer: Managed Care, Other (non HMO) | Source: Ambulatory Visit | Attending: Urology | Admitting: Urology

## 2011-12-03 DIAGNOSIS — Z09 Encounter for follow-up examination after completed treatment for conditions other than malignant neoplasm: Secondary | ICD-10-CM | POA: Insufficient documentation

## 2011-12-03 DIAGNOSIS — T8389XA Other specified complication of genitourinary prosthetic devices, implants and grafts, initial encounter: Secondary | ICD-10-CM

## 2011-12-03 DIAGNOSIS — Z87448 Personal history of other diseases of urinary system: Secondary | ICD-10-CM | POA: Insufficient documentation

## 2012-04-03 ENCOUNTER — Ambulatory Visit (INDEPENDENT_AMBULATORY_CARE_PROVIDER_SITE_OTHER): Payer: Managed Care, Other (non HMO) | Admitting: Otolaryngology

## 2012-04-03 DIAGNOSIS — R040 Epistaxis: Secondary | ICD-10-CM

## 2012-05-01 ENCOUNTER — Ambulatory Visit (INDEPENDENT_AMBULATORY_CARE_PROVIDER_SITE_OTHER): Payer: Managed Care, Other (non HMO) | Admitting: Otolaryngology

## 2012-05-01 DIAGNOSIS — J310 Chronic rhinitis: Secondary | ICD-10-CM

## 2012-05-01 DIAGNOSIS — R0982 Postnasal drip: Secondary | ICD-10-CM

## 2012-05-01 DIAGNOSIS — R040 Epistaxis: Secondary | ICD-10-CM

## 2012-05-29 ENCOUNTER — Ambulatory Visit (INDEPENDENT_AMBULATORY_CARE_PROVIDER_SITE_OTHER): Payer: Managed Care, Other (non HMO) | Admitting: Otolaryngology

## 2012-05-29 DIAGNOSIS — R040 Epistaxis: Secondary | ICD-10-CM

## 2012-06-09 ENCOUNTER — Other Ambulatory Visit (HOSPITAL_COMMUNITY): Payer: Self-pay | Admitting: Urology

## 2012-06-09 DIAGNOSIS — R310 Gross hematuria: Secondary | ICD-10-CM

## 2012-06-10 ENCOUNTER — Ambulatory Visit (HOSPITAL_COMMUNITY)
Admission: RE | Admit: 2012-06-10 | Discharge: 2012-06-10 | Disposition: A | Discharge status: Home / Self Care | Payer: Managed Care, Other (non HMO) | Source: Ambulatory Visit | Attending: Urology | Admitting: Urology

## 2012-06-10 DIAGNOSIS — R310 Gross hematuria: Secondary | ICD-10-CM

## 2012-06-10 DIAGNOSIS — K7689 Other specified diseases of liver: Secondary | ICD-10-CM | POA: Insufficient documentation

## 2012-06-10 MED ORDER — IOHEXOL 300 MG/ML  SOLN
125.0000 mL | Freq: Once | INTRAMUSCULAR | Status: AC | PRN
  Administered 2012-06-10: 125 mL via INTRAVENOUS

## 2012-08-07 ENCOUNTER — Emergency Department (HOSPITAL_COMMUNITY)
Admission: EM | Admit: 2012-08-07 | Discharge: 2012-08-07 | Disposition: A | Discharge status: Home / Self Care | Payer: Managed Care, Other (non HMO) | Attending: Emergency Medicine | Admitting: Emergency Medicine

## 2012-08-07 ENCOUNTER — Emergency Department (HOSPITAL_COMMUNITY): Payer: Managed Care, Other (non HMO)

## 2012-08-07 ENCOUNTER — Encounter (HOSPITAL_COMMUNITY): Payer: Self-pay | Admitting: *Deleted

## 2012-08-07 DIAGNOSIS — Z79899 Other long term (current) drug therapy: Secondary | ICD-10-CM | POA: Insufficient documentation

## 2012-08-07 DIAGNOSIS — S6980XA Other specified injuries of unspecified wrist, hand and finger(s), initial encounter: Secondary | ICD-10-CM | POA: Insufficient documentation

## 2012-08-07 DIAGNOSIS — IMO0002 Reserved for concepts with insufficient information to code with codable children: Secondary | ICD-10-CM | POA: Insufficient documentation

## 2012-08-07 DIAGNOSIS — Z87891 Personal history of nicotine dependence: Secondary | ICD-10-CM | POA: Insufficient documentation

## 2012-08-07 DIAGNOSIS — Y9301 Activity, walking, marching and hiking: Secondary | ICD-10-CM | POA: Insufficient documentation

## 2012-08-07 DIAGNOSIS — M20019 Mallet finger of unspecified finger(s): Secondary | ICD-10-CM | POA: Insufficient documentation

## 2012-08-07 DIAGNOSIS — M20011 Mallet finger of right finger(s): Secondary | ICD-10-CM

## 2012-08-07 DIAGNOSIS — W010XXA Fall on same level from slipping, tripping and stumbling without subsequent striking against object, initial encounter: Secondary | ICD-10-CM | POA: Insufficient documentation

## 2012-08-07 DIAGNOSIS — E119 Type 2 diabetes mellitus without complications: Secondary | ICD-10-CM | POA: Insufficient documentation

## 2012-08-07 DIAGNOSIS — S6990XA Unspecified injury of unspecified wrist, hand and finger(s), initial encounter: Secondary | ICD-10-CM | POA: Insufficient documentation

## 2012-08-07 DIAGNOSIS — S8392XA Sprain of unspecified site of left knee, initial encounter: Secondary | ICD-10-CM

## 2012-08-07 DIAGNOSIS — Y929 Unspecified place or not applicable: Secondary | ICD-10-CM | POA: Insufficient documentation

## 2012-08-07 NOTE — ED Notes (Signed)
Advised of the importance of follow up for finger

## 2012-08-07 NOTE — ED Notes (Signed)
Pain and deformity of RMF and pain lt knee after fall. On ice.

## 2012-08-07 NOTE — ED Provider Notes (Signed)
History     CSN: [id number]  Arrival date & time 08/07/12  1654   First MD Initiated Contact with Patient 08/07/12 1840      Chief Complaint  Patient presents with  . Fall    (Consider location/radiation/quality/duration/timing/severity/associated sxs/prior treatment) HPI Comments: Pt  Slipped in mud and fell.  Pain to L knee and to R middle finger.  Patient is a 57 y.o. female presenting with fall. The history is provided by the patient. No language interpreter was used.  Fall The accident occurred 3 to 5 hours ago. The fall occurred while walking. She landed on dirt. The point of impact was the left knee. Pain location: R 3rd digit. She was ambulatory at the scene. There was no entrapment after the fall. There was no drug use involved in the accident. There was no alcohol use involved in the accident. Pertinent negatives include no fever and no numbness. The symptoms are aggravated by standing. She has tried nothing for the symptoms.    Past Medical History  Diagnosis Date  . Diabetes mellitus     Past Surgical History  Procedure Date  . Abdominal hysterectomy   . Vagina reconstruction surgery     tvt 2006- [place]  . Hallux valgus correction     bilateral foot surgery for bone repairs-multiple  . Cystostomy 11/22/2011    Procedure: CYSTOSTOMY SUPRAPUBIC;  Surgeon: Ky Barban, MD;  Location: [facility];  Service: Urology;  Laterality: N/A;  . Abdominal surgery     Family History  Problem Relation Age of Onset  . Anesthesia problems Neg Hx   . Hypotension Neg Hx   . Malignant hyperthermia Neg Hx   . Pseudochol deficiency Neg Hx     History  Substance Use Topics  . Smoking status: Former Smoker -- 1.0 packs/day for 20 years    Types: Cigarettes    Quit date: 11/19/2001  . Smokeless tobacco: Former Neurosurgeon    Quit date: 10/17/2011  . Alcohol Use: No     Comment: rarely    OB History    Grav Para Term Preterm Abortions TAB SAB Ect Mult Living        Review of Systems  Constitutional: Negative for fever and chills.  Musculoskeletal: Negative for joint swelling.       Knee and finger injuries.  Neurological: Negative for numbness.  All other systems reviewed and are negative.    Allergies  Synthroid and Iodine  Home Medications   Current Outpatient Rx  Name  Route  Sig  Dispense  Refill  . CIPROFLOXACIN HCL 250 MG PO TABS   Oral   Take 250 mg by mouth 2 (two) times daily.         Marland Kitchen LISINOPRIL 5 MG PO TABS   Oral   Take 5 mg by mouth every evening.          Marland Kitchen METFORMIN HCL 500 MG PO TABS   Oral   Take 500 mg by mouth 2 (two) times daily.           BP 132/51  Pulse 105  Temp 98.2 F (36.8 C) (Oral)  Resp 18  Ht 5\' 7"  (1.702 m)  Wt 220 lb (99.791 kg)  BMI 34.46 kg/m2  SpO2 98%  Physical Exam  Nursing note and vitals reviewed. Constitutional: She is oriented to person, place, and time. She appears well-developed and well-nourished. No distress.  HENT:  Head: Normocephalic and atraumatic.  Eyes: EOM are normal.  Neck: Normal range of motion.  Cardiovascular: Normal rate and regular rhythm.   Pulmonary/Chest: Effort normal.  Abdominal: Soft. She exhibits no distension. There is no tenderness.  Musculoskeletal: She exhibits tenderness.       Left knee: She exhibits decreased range of motion. She exhibits no swelling, no effusion, no ecchymosis, no deformity and no laceration. tenderness found. Lateral joint line tenderness noted.       Right hand: She exhibits decreased range of motion and tenderness. She exhibits no bony tenderness, no deformity, no laceration and no swelling. normal sensation noted. Normal strength noted.       Hands:      Legs: Neurological: She is alert and oriented to person, place, and time.  Skin: Skin is warm and dry.  Psychiatric: She has a normal mood and affect. Judgment normal.    ED Course  Procedures (including critical care time)  Labs Reviewed - No data to  display Dg Knee Complete 4 Views Left  08/07/2012  *RADIOLOGY REPORT*  Clinical Data: Pain post fall  LEFT KNEE - COMPLETE 4+ VIEW  Comparison: None.  Findings: Four views of the left knee submitted.  No acute fracture or subluxation.  Minimal narrowing of medial joint compartment. Mild narrowing of patellofemoral joint space.  Small joint effusion.  IMPRESSION: No acute fracture or subluxation.  Minimal degenerative changes.   Original Report Authenticated By: Natasha Mead, M.D.    Dg Finger Middle Right  08/07/2012  *RADIOLOGY REPORT*  Clinical Data: Fall.  Long finger pain.  Inability to extend the finger.  RIGHT MIDDLE FINGER 2+V  Comparison: None.  Findings: There are calcific fragments along the dorsal aspect of the DIP joint of the long finger.  This suggests extensor mechanism injury.  Overlying soft tissue swelling is present.  There is flexion deformity of the DIP joint.  The PIP joint appears within normal limits.  IMPRESSION: Mallet finger compatible with distal extensor tendon injury.   Original Report Authenticated By: Andreas Newport, M.D.      1. Mallet finger of right hand   2. Left knee sprain       MDM  Finger splinted in hyperextension. Pt refuses knee immobilizer.  Prefers an ace wrap. Ice, ibuprofen F/u with dr. Romeo Apple re knee F/u with dr. Merlyn Lot re finger.        Evalina Field, [place] 08/07/12 1904

## 2012-08-09 NOTE — ED Provider Notes (Signed)
Medical screening examination/treatment/procedure(s) were performed by non-physician practitioner and as supervising physician I was immediately available for consultation/collaboration. Rease Swinson, MD, FACEP   Cassandra Mcmanaman L Patryck Kilgore, MD 08/09/12 0709 

## 2012-08-12 ENCOUNTER — Ambulatory Visit (INDEPENDENT_AMBULATORY_CARE_PROVIDER_SITE_OTHER): Payer: Managed Care, Other (non HMO) | Admitting: Orthopedic Surgery

## 2012-08-12 ENCOUNTER — Ambulatory Visit (INDEPENDENT_AMBULATORY_CARE_PROVIDER_SITE_OTHER): Payer: Managed Care, Other (non HMO)

## 2012-08-12 ENCOUNTER — Encounter: Payer: Self-pay | Admitting: Orthopedic Surgery

## 2012-08-12 VITALS — BP 170/90 | Ht 67.0 in | Wt 257.0 lb

## 2012-08-12 DIAGNOSIS — S8263XA Displaced fracture of lateral malleolus of unspecified fibula, initial encounter for closed fracture: Secondary | ICD-10-CM

## 2012-08-12 DIAGNOSIS — M25473 Effusion, unspecified ankle: Secondary | ICD-10-CM

## 2012-08-12 DIAGNOSIS — S8392XA Sprain of unspecified site of left knee, initial encounter: Secondary | ICD-10-CM

## 2012-08-12 DIAGNOSIS — M25579 Pain in unspecified ankle and joints of unspecified foot: Secondary | ICD-10-CM

## 2012-08-12 DIAGNOSIS — IMO0002 Reserved for concepts with insufficient information to code with codable children: Secondary | ICD-10-CM

## 2012-08-12 MED ORDER — HYDROCODONE-ACETAMINOPHEN 5-325 MG PO TABS: 1.0000 | ORAL_TABLET | ORAL | Status: DC | PRN

## 2012-08-12 MED ORDER — DICLOFENAC SODIUM 50 MG PO TBEC: 50.0000 mg | DELAYED_RELEASE_TABLET | Freq: Two times a day (BID) | ORAL | Status: DC

## 2012-08-12 NOTE — Progress Notes (Signed)
Patient ID: JMYA ULIANO, female   DOB: 02/08/1955, 57 y.o.   MRN: [id number] Chief Complaint  Patient presents with  . Knee Pain    Left knee pain d/t fall 08/07/12 and pt fell again 08/11/12    57 year-old female fell and injured her left knee on January 9. She fell again last night injured her left ankle. She says that the knee was starting to feel a little bit better despite her medial joint line pain which is 6/10 described as sharp dull coming and going. She has pain when she twists or turns but denies locking or catching. Just lateral ankle tenderness with swelling bruising and difficulty weightbearing and decreased range of motion  Review of systems seasonal allergies all other systems were normal  Medical history as recorded  BP 170/90  Ht 5\' 7"  (1.702 m)  Wt 257 lb (116.574 kg)  BMI 40.25 kg/m2 General appearance is normal except for slightly overweight She is oriented x3 Her mood and affect are normal She is angulating with a limp because of the ankle but no assistive devices  Upper extremity exam  The right and left upper extremity:   Inspection and palpation revealed no abnormalities in the upper extremities.   Range of motion is full without contracture.  Motor exam is normal with grade 5 strength.  The joints are fully reduced without subluxation.  There is no atrophy or tremor and muscle tone is normal.  All joints are stable.   Right lower extremity inspection is normal range of motion is full stability tests are normal strength is grade 5 skin is intact and normal  Left lower extremity hip exam normal left knee medial joint line tenderness no effusion pain with McMurray's maneuver ligaments are stable strength and muscle tone grade 5 skin is normal Left ankle tenderness swelling lateral collateral ligament and lateral fibula no medial tenderness Achilles tendon palpable and intact ankle joint stable strength assessment deferred because of pain skin is  intact  Distal pulses and temperature are normal without edema swelling related to ankle sprain most likely Lymph system is negative Sensory exam is normal There are no pathologic reflexes And balance is excellent  Knee x-rays are negative for acute process  Office film left ankle to rule out fracture  Impression  The ankle x-ray shows a nondisplaced fracture of the lateral malleolus  She's placed in a air cast full weightbearing x-ray in 6 weeks   In terms of the knee she most likely has a sprained knee although medial meniscal tear is also possible. Recommend anti-inflammatories for 6 weeks and Norco for pain if no improvement then I recommend MRI left knee. She's also placed in the left knee economy hinged stabilizer brace

## 2012-09-23 ENCOUNTER — Encounter: Payer: Self-pay | Admitting: Orthopedic Surgery

## 2012-09-23 ENCOUNTER — Ambulatory Visit (INDEPENDENT_AMBULATORY_CARE_PROVIDER_SITE_OTHER): Payer: Managed Care, Other (non HMO) | Admitting: Orthopedic Surgery

## 2012-09-23 ENCOUNTER — Ambulatory Visit (INDEPENDENT_AMBULATORY_CARE_PROVIDER_SITE_OTHER): Payer: Managed Care, Other (non HMO)

## 2012-09-23 VITALS — Ht 67.0 in | Wt 257.0 lb

## 2012-09-23 DIAGNOSIS — S82892D Other fracture of left lower leg, subsequent encounter for closed fracture with routine healing: Secondary | ICD-10-CM

## 2012-09-23 DIAGNOSIS — S8262XD Displaced fracture of lateral malleolus of left fibula, subsequent encounter for closed fracture with routine healing: Secondary | ICD-10-CM

## 2012-09-23 DIAGNOSIS — S8290XD Unspecified fracture of unspecified lower leg, subsequent encounter for closed fracture with routine healing: Secondary | ICD-10-CM

## 2012-09-23 DIAGNOSIS — Z5189 Encounter for other specified aftercare: Secondary | ICD-10-CM

## 2012-09-23 DIAGNOSIS — S8392XD Sprain of unspecified site of left knee, subsequent encounter: Secondary | ICD-10-CM

## 2012-09-23 NOTE — Patient Instructions (Addendum)
Wear ankle brace x 2 more weeks    Ankle Fracture with Rehab Two bones in the ankle, the shinbone (tibia) and the bone of the outer ankle and lower leg (fibula), are susceptible to being fractured. The fracture may be a complete or an incomplete break of the bone. It is also common for the ligaments of the ankle joint to be injured at the same time as a fracture. SYMPTOMS   Severe pain in the ankle at the time of injury and/or when trying to move the ankle.  Feeling of popping or tearing in the inner or outer part of the ankle, sometimes as if the ankle joint was temporarily dislocated and popped back into place.  Cracking or other sounds may be heard at the time of fracture.  Severe tenderness in the ankle.  Swelling in the ankle and foot  Blisters around the ankle (uncommon).  Bleeding and bruising (contusion) in the ankle and foot.  Inability to stand or bear weight on the injured foot.  Visible deformity if the fracture is complete and the bone fragments separate enough to distort normal leg contours.  Numbness and coldness in the foot if the blood supply is impaired. CAUSES  Bones break when subjected to a force that is greater than the their strength. Most fractures are due to direct trauma, such as being hit with an object or falling.  Fractured may also be caused by indirect stress, such as twisting, pivoting, or violent muscle contraction . RISK INCREASES WITH:  Sports that require quick changes in direction (football, soccer, or skiing).  Sports that require jumping (basketball, volleyball, distance jumping, or high jumping).  Walking or running on uneven or rough surfaces.  Shoes with inadequate support to prevent the foot and ankle from rolling over when stress occurs.  Bony abnormalities (osteoporosis or bone tumors).  Metabolic disorders, hormone problems, and nutritional deficiencies and disorders.  Poor strength and flexibility  Previous ankle  injury. PREVENTION   Warm up and stretch properly before activity.  Maintain physical fitness:  Leg and ankle strength.  Flexibility and endurance.  Cardiovascular fitness.  Wear properly fitted protective equipment (high-top shoes or when appropriate and ankle bracing, taping, or splinting), especially for the first 12 months after an ankle injury. PROGNOSIS  If treated properly, ankle fractures typically heal well. RELATED COMPLICATIONS   Failure to heal (nonunion).  Healing in poor position (malunion).  Arrest of normal bone growth in children.  Proneness to repeated ankle injury.  Stiff ankle.  Unstable or arthritic ankle.  Infected skin blisters.  Prolonged healing time if activity is resumed too quickly. Risks of surgery, including infection, bleeding, injury to nerves (numbness, weakness, paralysis), and need for further surgery. TREATMENT  Treatment initially consists of ice and medication to help reduce pain and inflammation. The joint must be immobilized to allow for healing. If the fracture is where the bones are out of alignment (displaced), then surgery may be necessary to realign (reduce) them. Surgery usually involves placing pins and screws in the bones to hold them in place while the fracture heals. After surgery the joint is immobilized. Bone growth stimulators may be used to promote bone growth, but this is uncommon, Strengthening and stretching exercises are usually necessary after immobilization in order to regain strength and a full range of motion. These exercises may be completed at home or with a therapist. If pins and screws are placed in the bone, they are not usually removed unless they become a source  of pain. MEDICATION   If pain medication is necessary, then nonsteroidal anti-inflammatory medications, such as aspirin and ibuprofen, or other minor pain relievers, such as acetaminophen, are often recommended.  Do not take pain medication within 7  days before surgery.  Prescription pain relievers may be prescribed if deemed necessary by your caregiver. Use only as directed and only as much as you need. SEEK MEDICAL CARE IF:   Symptoms get worse or do not improve in 2 weeks despite treatment.  The following occur after immobilization or surgery:  Swelling above or below the fracture site.  Severe, persistent pain.  Blue or gray skin below the fracture site, especially under the nails, or numbness or loss of feeling below the fracture site. Report any of these signs immediately.  New, unexplained symptoms develop (drugs used in treatment may produce side effects). EXERCISES  RANGE OF MOTION (ROM) AND STRETCHING EXERCISES - Ankle Fracture These exercises may help you when beginning to rehabilitate your injury. Your symptoms may resolve with or without further involvement from your physician, physical therapist or athletic trainer. While completing these exercises, remember:   Restoring tissue flexibility helps normal motion to return to the joints. This allows healthier, less painful movement and activity.  An effective stretch should be held for at least 30 seconds.  A stretch should never be painful. You should only feel a gentle lengthening or release in the stretched tissue. RANGE OF MOTION - Dorsi/Plantar Flexion  While sitting with your right / left knee straight, draw the top of your foot upwards by flexing your ankle. Then reverse the motion, pointing your toes downward.  Hold each position for __________ seconds.  After completing your first set of exercises, repeat this exercise with your knee bent. Repeat __________ times. Complete this exercise __________ times per day.  RANGE OF MOTION- Ankle Plantar Flexion   Sit with your right / left leg crossed over your opposite knee.  Use your opposite hand to pull the top of your foot and toes toward you.  You should feel a gentle stretch on the top of your foot/ankle.  Hold this position for __________ seconds. Repeat __________ times. Complete __________ times per day.  RANGE OF MOTION - Ankle Eversion  Sit with your right / left ankle crossed over your opposite knee.  Grip your foot with your opposite hand, placing your thumb on the top of your foot and your fingers across the bottom of your foot.  Gently push your foot downward with a slight rotation so your littlest toes rise slightly  You should feel a gentle stretch on the inside of your ankle. Hold the stretch for __________ seconds. Repeat __________ times. Complete this exercise __________ times per day.  RANGE OF MOTION - Ankle Inversion  Sit with your right / left ankle crossed over your opposite knee.  Grip your foot with your opposite hand, placing your thumb on the bottom of your foot and your fingers across the top of your foot.  Gently pull your foot so the smallest toe comes toward you and your thumb pushes the inside of the ball of your foot away from you.  You should feel a gentle stretch on the outside of your ankle. Hold the stretch for __________ seconds. Repeat __________ times. Complete this exercise __________ times per day.  RANGE OF MOTION - Ankle Alphabet  Imagine your right / left big toe is a pen.  Keeping your hip and knee still, write out the entire alphabet with  your "pen." Make the letters as large as you can without increasing any discomfort. Repeat __________ times. Complete this exercise __________ times per day.  RANGE OF MOTION - Ankle Dorsiflexion, Active Assisted   Remove shoes and sit on a chair that is preferably not on a carpeted surface.  Place right / left foot under knee. Extend your opposite leg for support.  Keeping your heel down, slide your right / left foot back toward the chair until you feel a stretch at your ankle or calf. If you do not feel a stretch, slide your bottom forward to the edge of the chair, while still keeping your heel  down.  Hold this stretch for __________ seconds. Repeat __________ times. Complete this stretch __________ times per day.  STRETCH - Gastrocsoleus   Sit with your right / left leg extended. Holding onto both ends of a belt or towel, loop it around the ball of your foot.  Keeping your right / left ankle and foot relaxed and your knee straight, pull your foot and ankle toward you using the belt/towel.  You should feel a gentle stretch behind your calf or knee. Hold this position for __________ seconds. Repeat __________ times. Complete this stretch __________ times per day.  STRENGTHENING EXERCISES - Ankle Fracture These exercises may help you when beginning to rehabilitate your injury. They may resolve your symptoms with or without further involvement from your physician, physical therapist or athletic trainer. While completing these exercises, remember:   Muscles can gain both the endurance and the strength needed for everyday activities through controlled exercises.  Complete these exercises as instructed by your physician, physical therapist or athletic trainer. Progress the resistance and repetitions only as guided.  You may experience muscle soreness or fatigue, but the pain or discomfort you are trying to eliminate should never worsen during these exercises. If this pain does worsen, stop and make certain you are following the directions exactly. If the pain is still present after adjustments, discontinue the exercise until you can discuss the trouble with your clinician. STRENGTH - Dorsiflexors  Secure a rubber exercise band/tubing to a fixed object (table, pole) and loop the other end around your right / left foot.  Sit on the floor facing the fixed object. The band/tubing should be slightly tense when your foot is relaxed.  Slowly draw your foot back toward you using your ankle and toes.  Hold this position for __________ seconds. Slowly release the tension in the band and return  your foot to the starting position. Repeat __________ times. Complete this exercise __________ times per day.  STRENGTH - Plantar-flexors  Sit with your right / left leg extended. Holding onto both ends of a rubber exercise band/tubing, loop it around the ball of your foot. Keep a slight tension in the band.  Slowly push your toes away from you, pointing them downward.  Hold this position for __________ seconds. Return slowly, controlling the tension in the band/tubing. Repeat __________ times. Complete this exercise __________ times per day.  STRENGTH - Ankle Eversion  Secure one end of a rubber exercise band/tubing to a fixed object (table, pole). Loop the other end around your foot just before your toes.  Place your fists between your knees. This will focus your strengthening at your ankle.  Drawing the band/tubing across your opposite foot, slowly, pull your little toe out and up. Make sure the band/tubing is positioned to resist the entire motion.  Hold this position for __________ seconds.  Have your  muscles resist the band/tubing as it slowly pulls your foot back to the starting position. Repeat __________ times. Complete this exercise __________ times per day.  STRENGTH - Ankle Inversion  Secure one end of a rubber exercise band/tubing to a fixed object (table, pole). Loop the other end around your foot just before your toes.  Place your fists between your knees. This will focus your strengthening at your ankle.  Slowly, pull your big toe up and in, making sure the band/tubing is positioned to resist the entire motion.  Hold this position for __________ seconds.  Have your muscles resist the band/tubing as it slowly pulls your foot back to the starting position. Repeat __________ times. Complete this exercises __________ times per day.  STRENGTH - Towel Curls  Sit in a chair positioned on a non-carpeted surface.  Place your foot on a towel, keeping your heel on the  floor.  Pull the towel toward your heel by only curling your toes. Keep your heel on the floor.  If instructed by your physician, physical therapist or athletic trainer, add weight at the end of the towel. Repeat __________ times. Complete this exercise __________ times per day. STRENGTH - Plantar-flexors, Standing   Stand with your feet shoulder width apart. Steady yourself with a wall or table using as little support as needed.  Keeping your weight evenly spread over the width of your feet, rise up on your toes.*  Hold this position for __________ seconds. Repeat __________ times. Complete this exercise __________ times per day.  *If this is too easy, shift your weight toward your right / left leg until you feel challenged. Ultimately, you may be asked to do this exercise with your right / left foot only. Document Released: 02/14/2005 Document Revised: 10/08/2011 Document Reviewed: 10/28/2008 [facility] Patient Information 2013 [place], [place]. Ankle Fracture with Rehab Two bones in the ankle, the shinbone (tibia) and the bone of the outer ankle and lower leg (fibula), are susceptible to being fractured. The fracture may be a complete or an incomplete break of the bone. It is also common for the ligaments of the ankle joint to be injured at the same time as a fracture. SYMPTOMS   Severe pain in the ankle at the time of injury and/or when trying to move the ankle.  Feeling of popping or tearing in the inner or outer part of the ankle, sometimes as if the ankle joint was temporarily dislocated and popped back into place.  Cracking or other sounds may be heard at the time of fracture.  Severe tenderness in the ankle.  Swelling in the ankle and foot  Blisters around the ankle (uncommon).  Bleeding and bruising (contusion) in the ankle and foot.  Inability to stand or bear weight on the injured foot.  Visible deformity if the fracture is complete and the bone fragments separate  enough to distort normal leg contours.  Numbness and coldness in the foot if the blood supply is impaired. CAUSES  Bones break when subjected to a force that is greater than the their strength. Most fractures are due to direct trauma, such as being hit with an object or falling.  Fractured may also be caused by indirect stress, such as twisting, pivoting, or violent muscle contraction . RISK INCREASES WITH:  Sports that require quick changes in direction (football, soccer, or skiing).  Sports that require jumping (basketball, volleyball, distance jumping, or high jumping).  Walking or running on uneven or rough surfaces.  Shoes with inadequate support  to prevent the foot and ankle from rolling over when stress occurs.  Bony abnormalities (osteoporosis or bone tumors).  Metabolic disorders, hormone problems, and nutritional deficiencies and disorders.  Poor strength and flexibility  Previous ankle injury. PREVENTION   Warm up and stretch properly before activity.  Maintain physical fitness:  Leg and ankle strength.  Flexibility and endurance.  Cardiovascular fitness.  Wear properly fitted protective equipment (high-top shoes or when appropriate and ankle bracing, taping, or splinting), especially for the first 12 months after an ankle injury. PROGNOSIS  If treated properly, ankle fractures typically heal well. RELATED COMPLICATIONS   Failure to heal (nonunion).  Healing in poor position (malunion).  Arrest of normal bone growth in children.  Proneness to repeated ankle injury.  Stiff ankle.  Unstable or arthritic ankle.  Infected skin blisters.  Prolonged healing time if activity is resumed too quickly. Risks of surgery, including infection, bleeding, injury to nerves (numbness, weakness, paralysis), and need for further surgery. TREATMENT  Treatment initially consists of ice and medication to help reduce pain and inflammation. The joint must be immobilized  to allow for healing. If the fracture is where the bones are out of alignment (displaced), then surgery may be necessary to realign (reduce) them. Surgery usually involves placing pins and screws in the bones to hold them in place while the fracture heals. After surgery the joint is immobilized. Bone growth stimulators may be used to promote bone growth, but this is uncommon, Strengthening and stretching exercises are usually necessary after immobilization in order to regain strength and a full range of motion. These exercises may be completed at home or with a therapist. If pins and screws are placed in the bone, they are not usually removed unless they become a source of pain. MEDICATION   If pain medication is necessary, then nonsteroidal anti-inflammatory medications, such as aspirin and ibuprofen, or other minor pain relievers, such as acetaminophen, are often recommended.  Do not take pain medication within 7 days before surgery.  Prescription pain relievers may be prescribed if deemed necessary by your caregiver. Use only as directed and only as much as you need. SEEK MEDICAL CARE IF:   Symptoms get worse or do not improve in 2 weeks despite treatment.  The following occur after immobilization or surgery:  Swelling above or below the fracture site.  Severe, persistent pain.  Blue or gray skin below the fracture site, especially under the nails, or numbness or loss of feeling below the fracture site. Report any of these signs immediately.  New, unexplained symptoms develop (drugs used in treatment may produce side effects). EXERCISES  RANGE OF MOTION (ROM) AND STRETCHING EXERCISES - Ankle Fracture These exercises may help you when beginning to rehabilitate your injury. Your symptoms may resolve with or without further involvement from your physician, physical therapist or athletic trainer. While completing these exercises, remember:   Restoring tissue flexibility helps normal motion to  return to the joints. This allows healthier, less painful movement and activity.  An effective stretch should be held for at least 30 seconds.  A stretch should never be painful. You should only feel a gentle lengthening or release in the stretched tissue. RANGE OF MOTION - Dorsi/Plantar Flexion  While sitting with your right / left knee straight, draw the top of your foot upwards by flexing your ankle. Then reverse the motion, pointing your toes downward.  Hold each position for _____2_____ seconds.  After completing your first set of exercises, repeat this exercise  with your knee bent. Repeat ____15______ times. Complete this exercise ____1______ times per day. Follow same for rest of the exercises / bungee cord can be obtained from [facility] hardware or [place] from [employer]   Ankle sleeve after air cast  RANGE OF MOTION- Ankle Plantar Flexion   Sit with your right / left leg crossed over your opposite knee.  Use your opposite hand to pull the top of your foot and toes toward you.  You should feel a gentle stretch on the top of your foot/ankle. Hold this position for __________ seconds. Repeat __________ times. Complete __________ times per day.  RANGE OF MOTION - Ankle Eversion  Sit with your right / left ankle crossed over your opposite knee.  Grip your foot with your opposite hand, placing your thumb on the top of your foot and your fingers across the bottom of your foot.  Gently push your foot downward with a slight rotation so your littlest toes rise slightly  You should feel a gentle stretch on the inside of your ankle. Hold the stretch for __________ seconds. Repeat __________ times. Complete this exercise __________ times per day.  RANGE OF MOTION - Ankle Inversion  Sit with your right / left ankle crossed over your opposite knee.  Grip your foot with your opposite hand, placing your thumb on the bottom of your foot and your fingers across the top of your  foot.  Gently pull your foot so the smallest toe comes toward you and your thumb pushes the inside of the ball of your foot away from you.  You should feel a gentle stretch on the outside of your ankle. Hold the stretch for __________ seconds. Repeat __________ times. Complete this exercise __________ times per day.  RANGE OF MOTION - Ankle Alphabet  Imagine your right / left big toe is a pen.  Keeping your hip and knee still, write out the entire alphabet with your "pen." Make the letters as large as you can without increasing any discomfort. Repeat __________ times. Complete this exercise __________ times per day.  RANGE OF MOTION - Ankle Dorsiflexion, Active Assisted   Remove shoes and sit on a chair that is preferably not on a carpeted surface.  Place right / left foot under knee. Extend your opposite leg for support.  Keeping your heel down, slide your right / left foot back toward the chair until you feel a stretch at your ankle or calf. If you do not feel a stretch, slide your bottom forward to the edge of the chair, while still keeping your heel down.  Hold this stretch for __________ seconds. Repeat __________ times. Complete this stretch __________ times per day.  STRETCH - Gastrocsoleus   Sit with your right / left leg extended. Holding onto both ends of a belt or towel, loop it around the ball of your foot.  Keeping your right / left ankle and foot relaxed and your knee straight, pull your foot and ankle toward you using the belt/towel.  You should feel a gentle stretch behind your calf or knee. Hold this position for __________ seconds. Repeat __________ times. Complete this stretch __________ times per day.  STRENGTHENING EXERCISES - Ankle Fracture These exercises may help you when beginning to rehabilitate your injury. They may resolve your symptoms with or without further involvement from your physician, physical therapist or athletic trainer. While completing these  exercises, remember:   Muscles can gain both the endurance and the strength needed for everyday activities through  controlled exercises.  Complete these exercises as instructed by your physician, physical therapist or athletic trainer. Progress the resistance and repetitions only as guided.  You may experience muscle soreness or fatigue, but the pain or discomfort you are trying to eliminate should never worsen during these exercises. If this pain does worsen, stop and make certain you are following the directions exactly. If the pain is still present after adjustments, discontinue the exercise until you can discuss the trouble with your clinician. STRENGTH - Dorsiflexors  Secure a rubber exercise band/tubing to a fixed object (table, pole) and loop the other end around your right / left foot.  Sit on the floor facing the fixed object. The band/tubing should be slightly tense when your foot is relaxed.  Slowly draw your foot back toward you using your ankle and toes.  Hold this position for __________ seconds. Slowly release the tension in the band and return your foot to the starting position. Repeat __________ times. Complete this exercise __________ times per day.  STRENGTH - Plantar-flexors  Sit with your right / left leg extended. Holding onto both ends of a rubber exercise band/tubing, loop it around the ball of your foot. Keep a slight tension in the band.  Slowly push your toes away from you, pointing them downward.  Hold this position for __________ seconds. Return slowly, controlling the tension in the band/tubing. Repeat __________ times. Complete this exercise __________ times per day.  STRENGTH - Ankle Eversion  Secure one end of a rubber exercise band/tubing to a fixed object (table, pole). Loop the other end around your foot just before your toes.  Place your fists between your knees. This will focus your strengthening at your ankle.  Drawing the band/tubing across your  opposite foot, slowly, pull your little toe out and up. Make sure the band/tubing is positioned to resist the entire motion.  Hold this position for __________ seconds.  Have your muscles resist the band/tubing as it slowly pulls your foot back to the starting position. Repeat __________ times. Complete this exercise __________ times per day.  STRENGTH - Ankle Inversion  Secure one end of a rubber exercise band/tubing to a fixed object (table, pole). Loop the other end around your foot just before your toes.  Place your fists between your knees. This will focus your strengthening at your ankle.  Slowly, pull your big toe up and in, making sure the band/tubing is positioned to resist the entire motion.  Hold this position for __________ seconds.  Have your muscles resist the band/tubing as it slowly pulls your foot back to the starting position. Repeat __________ times. Complete this exercises __________ times per day.  STRENGTH - Towel Curls  Sit in a chair positioned on a non-carpeted surface.  Place your foot on a towel, keeping your heel on the floor.  Pull the towel toward your heel by only curling your toes. Keep your heel on the floor.  If instructed by your physician, physical therapist or athletic trainer, add weight at the end of the towel. Repeat __________ times. Complete this exercise __________ times per day. STRENGTH - Plantar-flexors, Standing   Stand with your feet shoulder width apart. Steady yourself with a wall or table using as little support as needed.  Keeping your weight evenly spread over the width of your feet, rise up on your toes.*  Hold this position for __________ seconds. Repeat __________ times. Complete this exercise __________ times per day.  *If this is too easy, shift your  weight toward your right / left leg until you feel challenged. Ultimately, you may be asked to do this exercise with your right / left foot only. Document Released: 02/14/2005  Document Revised: 10/08/2011 Document Reviewed: 10/28/2008 [facility] Patient Information 2013 [place], [place].

## 2012-09-23 NOTE — Progress Notes (Signed)
Patient ID: [name], female   DOB: 02/08/1955, 57 y.o.   MRN: [id number] Chief Complaint  Patient presents with  . Follow-up    6 week recheck left knee and xray left ankle.    Ht 5\' 7"  (1.702 m)  Wt 257 lb (116.574 kg)  BMI 40.24 kg/m2  Treatment the patient had a left ankle fracture lateral malleolus and a sprain right knee she was treated with an Aircast and a hinged knee brace  She says her knee feels good her ankle still feels sore and she's concerned that when she returns worse after walking on unlevel ground  Review of systems no numbness tingling or vascular symptoms  Exam of the left ankle shows that she does have some soreness and tenderness around the fibula which on x-ray has healed with an intact mortise shows a stable ankle joint normal plantar flexion 15 of dorsiflexion skin is intact pulses good sensation is normal  X-ray shows healed fracture  Impression Ankle fracture, left, closed, with routine healing, subsequent encounter - Plan: DG Ankle Complete Left  Left knee sprain, subsequent encounter  Fractured lateral malleolus, left, closed, with routine healing, subsequent encounter   Plan continue air cast for 2 more weeks then switch to ankle sleeve  Brace on the knee can be removed followup with [place] as needed

## 2013-07-20 ENCOUNTER — Encounter (HOSPITAL_COMMUNITY): Payer: Self-pay | Admitting: Psychiatry

## 2013-07-20 ENCOUNTER — Ambulatory Visit (INDEPENDENT_AMBULATORY_CARE_PROVIDER_SITE_OTHER): Payer: Managed Care, Other (non HMO) | Admitting: Psychiatry

## 2013-07-20 VITALS — BP 160/84 | Ht 67.0 in | Wt 247.0 lb

## 2013-07-20 DIAGNOSIS — F329 Major depressive disorder, single episode, unspecified: Secondary | ICD-10-CM

## 2013-07-20 MED ORDER — ESCITALOPRAM OXALATE 10 MG PO TABS: 10.0000 mg | ORAL_TABLET | ORAL | Status: DC

## 2013-07-20 MED ORDER — TRAZODONE HCL 50 MG PO TABS: 50.0000 mg | ORAL_TABLET | Freq: Every day | ORAL | Status: DC
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
## 2013-08-12 ENCOUNTER — Ambulatory Visit (HOSPITAL_COMMUNITY): Payer: Self-pay | Admitting: Psychiatry

## 2013-08-17 ENCOUNTER — Ambulatory Visit (INDEPENDENT_AMBULATORY_CARE_PROVIDER_SITE_OTHER): Payer: Managed Care, Other (non HMO) | Admitting: Psychiatry

## 2013-08-17 ENCOUNTER — Encounter (HOSPITAL_COMMUNITY): Payer: Self-pay | Admitting: Psychiatry

## 2013-08-17 VITALS — BP 170/90 | Ht 67.0 in | Wt 243.0 lb

## 2013-08-17 DIAGNOSIS — F329 Major depressive disorder, single episode, unspecified: Secondary | ICD-10-CM

## 2013-08-17 MED ORDER — ESCITALOPRAM OXALATE 10 MG PO TABS: 10.0000 mg | ORAL_TABLET | ORAL | Status: DC

## 2013-08-17 MED ORDER — TRAZODONE HCL 50 MG PO TABS: 50.0000 mg | ORAL_TABLET | Freq: Every day | ORAL | Status: DC
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
## 2013-09-02 ENCOUNTER — Ambulatory Visit (INDEPENDENT_AMBULATORY_CARE_PROVIDER_SITE_OTHER): Payer: Managed Care, Other (non HMO) | Admitting: Psychiatry

## 2013-09-02 DIAGNOSIS — F329 Major depressive disorder, single episode, unspecified: Secondary | ICD-10-CM
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
## 2013-09-03 NOTE — Patient Instructions (Signed)
Discussed orally 

## 2013-09-18 ENCOUNTER — Ambulatory Visit (INDEPENDENT_AMBULATORY_CARE_PROVIDER_SITE_OTHER): Payer: Managed Care, Other (non HMO) | Admitting: Psychiatry

## 2013-09-18 DIAGNOSIS — F329 Major depressive disorder, single episode, unspecified: Secondary | ICD-10-CM
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
## 2013-09-21 NOTE — Patient Instructions (Signed)
Discussed orally 

## 2013-10-02 ENCOUNTER — Ambulatory Visit (INDEPENDENT_AMBULATORY_CARE_PROVIDER_SITE_OTHER): Payer: Managed Care, Other (non HMO) | Admitting: Psychiatry

## 2013-10-02 DIAGNOSIS — F329 Major depressive disorder, single episode, unspecified: Secondary | ICD-10-CM

## 2013-10-02 NOTE — Patient Instructions (Signed)
Discussed orally 
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
## 2013-10-13 ENCOUNTER — Ambulatory Visit (INDEPENDENT_AMBULATORY_CARE_PROVIDER_SITE_OTHER): Payer: Managed Care, Other (non HMO) | Admitting: Psychiatry

## 2013-10-13 ENCOUNTER — Encounter (HOSPITAL_COMMUNITY): Payer: Self-pay | Admitting: Psychiatry

## 2013-10-13 VITALS — BP 160/90 | Ht 67.0 in | Wt 239.0 lb

## 2013-10-13 DIAGNOSIS — F32A Depression, unspecified: Secondary | ICD-10-CM

## 2013-10-13 DIAGNOSIS — F329 Major depressive disorder, single episode, unspecified: Secondary | ICD-10-CM

## 2013-10-13 MED ORDER — TRAZODONE HCL 100 MG PO TABS: 100.0000 mg | ORAL_TABLET | Freq: Every day | ORAL | Status: DC

## 2013-10-13 MED ORDER — ESCITALOPRAM OXALATE 10 MG PO TABS: 10.0000 mg | ORAL_TABLET | ORAL | Status: DC
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
## 2013-10-15 ENCOUNTER — Ambulatory Visit (INDEPENDENT_AMBULATORY_CARE_PROVIDER_SITE_OTHER): Payer: Managed Care, Other (non HMO) | Admitting: Psychiatry

## 2013-10-15 DIAGNOSIS — F329 Major depressive disorder, single episode, unspecified: Secondary | ICD-10-CM

## 2013-10-16 NOTE — Patient Instructions (Signed)
Discussed orally 
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
## 2013-11-02 ENCOUNTER — Ambulatory Visit (HOSPITAL_COMMUNITY): Payer: Self-pay | Admitting: Psychiatry

## 2013-11-20 ENCOUNTER — Ambulatory Visit (INDEPENDENT_AMBULATORY_CARE_PROVIDER_SITE_OTHER): Payer: Managed Care, Other (non HMO) | Admitting: Psychiatry

## 2013-11-20 DIAGNOSIS — F329 Major depressive disorder, single episode, unspecified: Secondary | ICD-10-CM
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
## 2013-11-23 NOTE — Patient Instructions (Signed)
Discussed orally 

## 2013-12-09 ENCOUNTER — Ambulatory Visit (HOSPITAL_COMMUNITY): Payer: Self-pay | Admitting: Psychiatry

## 2013-12-11 ENCOUNTER — Ambulatory Visit (INDEPENDENT_AMBULATORY_CARE_PROVIDER_SITE_OTHER): Payer: Managed Care, Other (non HMO) | Admitting: Psychiatry

## 2013-12-11 ENCOUNTER — Encounter (HOSPITAL_COMMUNITY): Payer: Self-pay | Admitting: Psychiatry

## 2013-12-11 VITALS — BP 140/80 | Ht 67.0 in | Wt 231.0 lb

## 2013-12-11 DIAGNOSIS — F329 Major depressive disorder, single episode, unspecified: Secondary | ICD-10-CM

## 2013-12-11 MED ORDER — TRAZODONE HCL 100 MG PO TABS: 100.0000 mg | ORAL_TABLET | Freq: Every day | ORAL | Status: DC

## 2013-12-11 MED ORDER — ESCITALOPRAM OXALATE 10 MG PO TABS: 10.0000 mg | ORAL_TABLET | ORAL | Status: DC
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
## 2014-01-05 ENCOUNTER — Ambulatory Visit (INDEPENDENT_AMBULATORY_CARE_PROVIDER_SITE_OTHER): Payer: Managed Care, Other (non HMO) | Admitting: Psychiatry

## 2014-01-05 DIAGNOSIS — F329 Major depressive disorder, single episode, unspecified: Secondary | ICD-10-CM

## 2014-01-05 NOTE — Patient Instructions (Signed)
Discussed orally 
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
## 2014-01-08 ENCOUNTER — Other Ambulatory Visit (HOSPITAL_COMMUNITY): Payer: Self-pay | Admitting: Psychiatry

## 2014-01-22 ENCOUNTER — Ambulatory Visit (INDEPENDENT_AMBULATORY_CARE_PROVIDER_SITE_OTHER): Payer: Managed Care, Other (non HMO) | Admitting: Psychiatry

## 2014-01-22 DIAGNOSIS — F320 Major depressive disorder, single episode, mild: Secondary | ICD-10-CM
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
## 2014-01-22 NOTE — Patient Instructions (Signed)
Discussed orally 

## 2014-01-26 ENCOUNTER — Other Ambulatory Visit (HOSPITAL_COMMUNITY): Payer: Self-pay | Admitting: Family Medicine

## 2014-01-26 DIAGNOSIS — Z1231 Encounter for screening mammogram for malignant neoplasm of breast: Secondary | ICD-10-CM

## 2014-02-01 ENCOUNTER — Ambulatory Visit (HOSPITAL_COMMUNITY)
Admission: RE | Admit: 2014-02-01 | Discharge: 2014-02-01 | Disposition: A | Discharge status: Home / Self Care | Payer: Managed Care, Other (non HMO) | Source: Ambulatory Visit | Attending: Family Medicine | Admitting: Family Medicine

## 2014-02-01 DIAGNOSIS — Z1231 Encounter for screening mammogram for malignant neoplasm of breast: Secondary | ICD-10-CM | POA: Insufficient documentation

## 2014-02-05 ENCOUNTER — Ambulatory Visit (INDEPENDENT_AMBULATORY_CARE_PROVIDER_SITE_OTHER): Payer: Managed Care, Other (non HMO) | Admitting: Psychiatry

## 2014-02-05 DIAGNOSIS — F320 Major depressive disorder, single episode, mild: Secondary | ICD-10-CM

## 2014-02-05 NOTE — Patient Instructions (Signed)
Discussed orally 
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
## 2014-02-23 ENCOUNTER — Ambulatory Visit (HOSPITAL_COMMUNITY): Payer: Self-pay | Admitting: Psychiatry

## 2014-03-03 ENCOUNTER — Encounter (INDEPENDENT_AMBULATORY_CARE_PROVIDER_SITE_OTHER): Payer: Self-pay

## 2014-03-03 ENCOUNTER — Other Ambulatory Visit: Payer: Self-pay | Admitting: Gastroenterology

## 2014-03-03 ENCOUNTER — Encounter (HOSPITAL_COMMUNITY): Payer: Self-pay | Admitting: Pharmacy Technician

## 2014-03-03 ENCOUNTER — Ambulatory Visit (INDEPENDENT_AMBULATORY_CARE_PROVIDER_SITE_OTHER): Payer: Managed Care, Other (non HMO) | Admitting: Gastroenterology

## 2014-03-03 ENCOUNTER — Encounter: Payer: Self-pay | Admitting: Gastroenterology

## 2014-03-03 VITALS — BP 133/80 | HR 69 | Temp 97.6°F | Ht 67.0 in | Wt 225.8 lb

## 2014-03-03 DIAGNOSIS — R194 Change in bowel habit: Secondary | ICD-10-CM

## 2014-03-03 DIAGNOSIS — R198 Other specified symptoms and signs involving the digestive system and abdomen: Secondary | ICD-10-CM

## 2014-03-03 DIAGNOSIS — K921 Melena: Secondary | ICD-10-CM

## 2014-03-03 MED ORDER — SOD PICOSULFATE-MAG OX-CIT ACD 10-3.5-12 MG-GM-GM PO PACK
1.0000 | PACK | ORAL | Status: DC
Start: 2014-03-03 — End: 2014-03-03

## 2014-03-03 NOTE — Progress Notes (Signed)
Subjective:    Patient ID: [name], female    DOB: 02/08/1955, 58 y.o.   MRN: [id number]  Maricela Curet, MD  HPI SX BEGAN ~4-5 MO AGO-OFF AND LOWER ABDOMINAL PAIN, RECTAL BLEEDING, AND AWAKENED WITH NAUSEA(SEVERE). THEN ~1 MO AGO WHEN HAS A BM THEN HAD RECTAL PAIN. FELT LIKE A TEARING IN HER ANUS. EATS LOTS OF FRUITS/VEGETABLES.  LOST 38 LBS SINCE SHE CHANGED HER DIET. CHANGE IN BOWEL HABITS: #4-->#7(ALL DAY). NO ASPIRIN, BC/GOODY POWDERS, IBUPROFEN/MOTRIN, OR NAPROXEN/ALEVE. RARE ETOH. BLACK STOOL WHEN SHE ATE BLUEBERRIES AND ? ONCE A WEEK. PAIN IN LEFT LOWER ABDOMEN AND IT DOESN'T STOP. SX LASTS: 2-3 DAYS AND DOESN'T GET BETTER AFTER BMs/PASSING GAS. 4-5 MOS AGO;ASTED 1 WEEK AND THIS TIME BEEN OFF AND ON FOR A MONTH. NO TRAVEL OR ABX. WELL WATER. HAD A COMPLICATED BLADDER TAPING. DR. Michela Pitcher FIXED IT.  PT DENIES FEVER, CHILLS, vomiting, CHEST PAIN, SHORTNESS OF BREATH,  constipation,  problems swallowing, problems with sedation, OR heartburn or indigestion.  Past Medical History  Diagnosis Date  . Diabetes mellitus   . Thyroid disease    Past Surgical History  Procedure Laterality Date  . Abdominal hysterectomy    . Vagina reconstruction surgery      tvt 2006- [place]  . Hallux valgus correction      bilateral foot surgery for bone repairs-multiple  . Cystostomy  11/22/2011    Procedure: CYSTOSTOMY SUPRAPUBIC;  Surgeon: Marissa Nestle, MD;  Location: [facility];  Service: Urology;  Laterality: N/A;  . Abdominal surgery     Allergies  Allergen Reactions  . Synthroid [Levothyroxine Sodium] Anaphylaxis  . Iodine Hives    Current Outpatient Prescriptions  Medication Sig Dispense Refill  . escitalopram (LEXAPRO) 10 MG tablet Take 1 tablet (10 mg total) by mouth every morning.    Marland Kitchen LEVEMIR FLEXTOUCH 100 UNIT/ML SOPN 28 UnitsAT BEDTIME    . lisinopril (PRINIVIL,ZESTRIL) 5 MG tablet Take 5 mg by mouth every evening.     . metFORMIN (GLUCOPHAGE) 500 MG tablet Take  500 mg by mouth 2 (two) times daily.    . NESINA 25 MG TABS  IN THE NIGHT    . pantoprazole (PROTONIX) 40 MG tablet Take 40 mg by mouth IN THE AM FOR ~1 YEAR   . traZODone (DESYREL) 100 MG tablet Take 1 tablet (100 mg total) by mouth at bedtime.    Marland Kitchen VICTOZA 18 MG/3ML SOPN 1.2 mg IN THE AM    .      .       Family History  Problem Relation Age of Onset  . Anesthesia problems Neg Hx   . Hypotension Neg Hx   . Malignant hyperthermia Neg Hx   . Pseudochol deficiency Neg Hx   . Colon polyps Neg Hx   . Colon cancer Neg Hx   . Celiac disease Neg Hx   . Pancreatic cancer Neg Hx   . Stomach cancer Neg Hx   . Ulcerative colitis Neg Hx   . Crohn's disease Neg Hx   . Asthma    . Diabetes    . Arthritis     History   Social History  . Marital Status: Single            . 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 Smoking status: Former Smoker -- 1.00 packs/day for 20 years    Types: Cigarettes    Quit date: 11/19/2001  . Smokeless  tobacco: Former Systems developer    Quit date: 10/17/2011  . Alcohol Use: No     Comment: rarely  . Drug Use: No  . Sexual Activity: Yes    Birth Control/ Protection: Surgical   Social History Narrative  . 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     Objective:   Physical Exam  Vitals reviewed. Constitutional: She is oriented to person, place, and time. She appears well-developed and well-nourished. No distress.  HENT:  Head: Normocephalic and atraumatic.  Mouth/Throat: Oropharynx is clear and moist. No oropharyngeal exudate.  Eyes: Pupils are equal, round, and reactive to light. No scleral icterus.  Neck: Normal range of motion. Neck supple.  Cardiovascular: Normal rate, regular rhythm and normal heart sounds.   Pulmonary/Chest: Effort normal and breath sounds normal. No respiratory distress.  Abdominal: Soft. Bowel sounds are normal. She  exhibits no distension. There is no tenderness.  Musculoskeletal: She exhibits no edema.  Lymphadenopathy:    She has no cervical adenopathy.  Neurological: She is alert and oriented to person, place, and time.  NO FOCAL DEFICITS   Psychiatric: She has a normal mood and affect.          Assessment & Plan:

## 2014-03-03 NOTE — Assessment & Plan Note (Addendum)
Since 2015 ASSOCIATED WITH [facility] ABDOMINAL PAIN/BLACK STOOLS. ETIOLOGY UNCLEAR-MILD FRUCTOSE INTOLERANCE/GASTRITIS/AVMs LIKELY IBD, MICROSCOPIC COLITIS, OR CELIAC SPRUE.  CHECK CBC IN PRE-OP ILEOTCS/EGD AUG 10-PREPOPIK.  DISCUSSED PROCEDURES BENEFITS, AND RISKS. BIOPSY COLON./DUODENUM/STOMACH.  OPV TBS AFTER ENDOSCOPY.

## 2014-03-03 NOTE — Patient Instructions (Addendum)
FULL LIQUID DIET AUG 9. SEE INFO BELOW  TAKE PREPOPIK AUG 9.  TAKE 1/2 LEVEMIR DOSE AT BEDTIME AUG 9.  HOLD NESINA, AND VICTOZA ON THE MORNING OF YOUR ENDOSCOPY. YOU MAY CONTINUE GLUCOPHAGE.  UPPER AND LOWER ENDOSCOPY AUG 10. I WILL CHECK YOU BLOOD COUNT IN PRE-OP.  I WILL SCHEDULE YOUR RETURN VISIT AFTER YOUR ENDOSCOPY.   Full Liquid Diet A high-calorie, high-protein supplement should be used to meet your nutritional requirements when the full liquid diet is continued for more than 2 or 3 days. If this diet is to be used for an extended period of time (more than 7 days), a multivitamin should be considered.  Breads and Starches  Allowed: None are allowed except crackers WHOLE OR pureed (made into a thick, smooth soup) in soup. Avoid: Any others.    Potatoes/Pasta/Rice  Allowed: ANY ITEM AS A SOUP OR SMALL PLATE OF MASHED POTATOES.      Vegetables  Allowed: Strained tomato or vegetable juice. Vegetables pureed in soup.   Avoid: Any others.    Fruit  Allowed: Any strained fruit juices and fruit drinks. Include 1 serving of citrus or vitamin C-enriched fruit juice daily.   Avoid: Any others.  Meat and Meat Substitutes  Allowed: Egg  Avoid: Any meat, fish, or fowl. All cheese.  Milk  Allowed: Milk beverages, including milk shakes and instant breakfast mixes. Smooth yogurt.   Avoid: Any others. Avoid dairy products if not tolerated.    Soups and Combination Foods  Allowed: Broth, strained cream soups. Strained, broth-based soups.   Avoid: Any others.    Desserts and Sweets  Allowed: flavored gelatin, tapioca, plain ice cream, sherbet, smooth pudding, junket, fruit ices, frozen ice pops, pudding pops,, frozen fudge pops, chocolate syrup. Sugar, honey, jelly, syrup.   Avoid: Any others.  Fats and Oils  Allowed: Margarine, butter, cream, sour cream, oils.   Avoid: Any others.  Beverages  Allowed: All.   Avoid: None.  Condiments  Allowed: Iodized  salt, pepper, spices, flavorings. Cocoa powder.   Avoid: Any others.    SAMPLE MEAL PLAN Breakfast   cup orange juice.   2 SCRAMBLED EGGD  1 cup  milk.   1 cup beverage (coffee or tea).   Cream or sugar, if desired.    Midmorning Snack  2 SCRAMBLED OR HARD BOILED EGG   Lunch  1 cup cream soup.    cup fruit juice.   1 cup milk.    cup custard.   1 cup beverage (coffee or tea).   Cream or sugar, if desired.    Midafternoon Snack  1 cup milk shake.  Dinner  1 cup cream soup.    cup fruit juice.   1 cup milk.    cup pudding.   1 cup beverage (coffee or tea).   Cream or sugar, if desired.  Evening Snack  1 cup supplement.  To increase calories, add sugar, cream, butter, or margarine if possible. Nutritional supplements will also increase the total calories.

## 2014-03-03 NOTE — Progress Notes (Signed)
Cc to PCP 

## 2014-03-04 ENCOUNTER — Telehealth: Payer: Self-pay

## 2014-03-04 MED ORDER — SOD PICOSULFATE-MAG OX-CIT ACD 10-3.5-12 MG-GM-GM PO PACK: 1.0000 | PACK | ORAL | Status: DC

## 2014-03-04 NOTE — Progress Notes (Signed)
Cc to PCP 

## 2014-03-04 NOTE — Telephone Encounter (Signed)
Pt called and spoke to Ginger and said her prescription for Prep-po-pik was not at the pharmacy. She has her instructions and is aware that the prescription will be around $100.00. I Sent the prescription to the pharmacy for her. Ginger just told her to check with pharmacy in a short time.

## 2014-03-08 ENCOUNTER — Ambulatory Visit (HOSPITAL_COMMUNITY)
Admission: RE | Admit: 2014-03-08 | Discharge: 2014-03-08 | Disposition: A | Discharge status: Home / Self Care | Payer: Managed Care, Other (non HMO) | Source: Ambulatory Visit | Attending: Gastroenterology | Admitting: Gastroenterology

## 2014-03-08 ENCOUNTER — Encounter (HOSPITAL_COMMUNITY)
Admission: RE | Disposition: A | Discharge status: Home / Self Care | Payer: Self-pay | Source: Ambulatory Visit | Attending: Gastroenterology

## 2014-03-08 DIAGNOSIS — R1032 Left lower quadrant pain: Secondary | ICD-10-CM | POA: Diagnosis not present

## 2014-03-08 DIAGNOSIS — K2970 Gastritis, unspecified, without bleeding: Secondary | ICD-10-CM | POA: Diagnosis not present

## 2014-03-08 DIAGNOSIS — K2980 Duodenitis without bleeding: Secondary | ICD-10-CM | POA: Diagnosis not present

## 2014-03-08 DIAGNOSIS — D129 Benign neoplasm of anus and anal canal: Secondary | ICD-10-CM

## 2014-03-08 DIAGNOSIS — D131 Benign neoplasm of stomach: Secondary | ICD-10-CM | POA: Insufficient documentation

## 2014-03-08 DIAGNOSIS — E119 Type 2 diabetes mellitus without complications: Secondary | ICD-10-CM | POA: Diagnosis not present

## 2014-03-08 DIAGNOSIS — K648 Other hemorrhoids: Secondary | ICD-10-CM | POA: Insufficient documentation

## 2014-03-08 DIAGNOSIS — R110 Nausea: Secondary | ICD-10-CM | POA: Insufficient documentation

## 2014-03-08 DIAGNOSIS — D126 Benign neoplasm of colon, unspecified: Secondary | ICD-10-CM | POA: Diagnosis not present

## 2014-03-08 DIAGNOSIS — R198 Other specified symptoms and signs involving the digestive system and abdomen: Secondary | ICD-10-CM | POA: Insufficient documentation

## 2014-03-08 DIAGNOSIS — Z87891 Personal history of nicotine dependence: Secondary | ICD-10-CM | POA: Insufficient documentation

## 2014-03-08 DIAGNOSIS — K625 Hemorrhage of anus and rectum: Secondary | ICD-10-CM | POA: Diagnosis present

## 2014-03-08 DIAGNOSIS — R197 Diarrhea, unspecified: Secondary | ICD-10-CM | POA: Insufficient documentation

## 2014-03-08 DIAGNOSIS — Z79899 Other long term (current) drug therapy: Secondary | ICD-10-CM | POA: Diagnosis not present

## 2014-03-08 DIAGNOSIS — Z90710 Acquired absence of both cervix and uterus: Secondary | ICD-10-CM | POA: Insufficient documentation

## 2014-03-08 DIAGNOSIS — E079 Disorder of thyroid, unspecified: Secondary | ICD-10-CM | POA: Diagnosis not present

## 2014-03-08 DIAGNOSIS — K2990 Gastroduodenitis, unspecified, without bleeding: Secondary | ICD-10-CM

## 2014-03-08 DIAGNOSIS — K921 Melena: Secondary | ICD-10-CM

## 2014-03-08 DIAGNOSIS — D128 Benign neoplasm of rectum: Secondary | ICD-10-CM | POA: Diagnosis not present

## 2014-03-08 DIAGNOSIS — R194 Change in bowel habit: Secondary | ICD-10-CM

## 2014-03-08 DIAGNOSIS — Q438 Other specified congenital malformations of intestine: Secondary | ICD-10-CM | POA: Diagnosis not present

## 2014-03-08 HISTORY — PX: COLONOSCOPY: SHX5424

## 2014-03-08 HISTORY — PX: ESOPHAGOGASTRODUODENOSCOPY: SHX5428

## 2014-03-08 LAB — CBC
HCT: 40.7 % (ref 36.0–46.0)
Hemoglobin: 13.2 g/dL (ref 12.0–15.0)
MCH: 24.8 pg — ABNORMAL LOW (ref 26.0–34.0)
MCHC: 32.4 g/dL (ref 30.0–36.0)
MCV: 76.5 fL — ABNORMAL LOW (ref 78.0–100.0)
Platelets: 227 10*3/uL (ref 150–400)
RBC: 5.32 MIL/uL — ABNORMAL HIGH (ref 3.87–5.11)
RDW: 15.1 % (ref 11.5–15.5)
WBC: 8.5 10*3/uL (ref 4.0–10.5)

## 2014-03-08 LAB — GLUCOSE, CAPILLARY: Glucose-Capillary: 120 mg/dL — ABNORMAL HIGH (ref 70–99)

## 2014-03-08 SURGERY — COLONOSCOPY
Anesthesia: Moderate Sedation

## 2014-03-08 MED ORDER — MEPERIDINE HCL 100 MG/ML IJ SOLN
INTRAMUSCULAR | Status: AC
  Filled 2014-03-08: qty 2

## 2014-03-08 MED ORDER — MIDAZOLAM HCL 5 MG/5ML IJ SOLN
INTRAMUSCULAR | Status: AC
  Filled 2014-03-08: qty 10

## 2014-03-08 MED ORDER — MEPERIDINE HCL 100 MG/ML IJ SOLN
INTRAMUSCULAR | Status: DC | PRN
  Administered 2014-03-08: 50 mg via INTRAVENOUS
  Administered 2014-03-08 (×3): 25 mg via INTRAVENOUS

## 2014-03-08 MED ORDER — STERILE WATER FOR IRRIGATION IR SOLN
Status: DC | PRN
  Administered 2014-03-08: 10:00:00

## 2014-03-08 MED ORDER — LIDOCAINE VISCOUS 2 % MT SOLN
OROMUCOSAL | Status: DC | PRN
  Administered 2014-03-08: 3 mL via OROMUCOSAL

## 2014-03-08 MED ORDER — LIDOCAINE VISCOUS 2 % MT SOLN
OROMUCOSAL | Status: AC
  Filled 2014-03-08: qty 15

## 2014-03-08 MED ORDER — MIDAZOLAM HCL 5 MG/5ML IJ SOLN
INTRAMUSCULAR | Status: DC | PRN
  Administered 2014-03-08 (×5): 2 mg via INTRAVENOUS

## 2014-03-08 MED ORDER — HYOSCYAMINE SULFATE ER 0.375 MG PO TB12: ORAL_TABLET | ORAL | Status: DC

## 2014-03-08 MED ORDER — SODIUM CHLORIDE 0.9 % IV SOLN
INTRAVENOUS | Status: DC
  Administered 2014-03-08: 1000 mL via INTRAVENOUS

## 2014-03-08 NOTE — H&P (View-Only) (Signed)
Subjective:    Patient ID: [name], female    DOB: 02/08/1955, 58 y.o.   MRN: [id number]  Maricela Curet, MD  HPI SX BEGAN ~4-5 MO AGO-OFF AND LOWER ABDOMINAL PAIN, RECTAL BLEEDING, AND AWAKENED WITH NAUSEA(SEVERE). THEN ~1 MO AGO WHEN HAS A BM THEN HAD RECTAL PAIN. FELT LIKE A TEARING IN HER ANUS. EATS LOTS OF FRUITS/VEGETABLES.  LOST 38 LBS SINCE SHE CHANGED HER DIET. CHANGE IN BOWEL HABITS: #4-->#7(ALL DAY). NO ASPIRIN, BC/GOODY POWDERS, IBUPROFEN/MOTRIN, OR NAPROXEN/ALEVE. RARE ETOH. BLACK STOOL WHEN SHE ATE BLUEBERRIES AND ? ONCE A WEEK. PAIN IN LEFT LOWER ABDOMEN AND IT DOESN'T STOP. SX LASTS: 2-3 DAYS AND DOESN'T GET BETTER AFTER BMs/PASSING GAS. 4-5 MOS AGO;ASTED 1 WEEK AND THIS TIME BEEN OFF AND ON FOR A MONTH. NO TRAVEL OR ABX. WELL WATER. HAD A COMPLICATED BLADDER TAPING. DR. Michela Pitcher FIXED IT.  PT DENIES FEVER, CHILLS, vomiting, CHEST PAIN, SHORTNESS OF BREATH,  constipation,  problems swallowing, problems with sedation, OR heartburn or indigestion.  Past Medical History  Diagnosis Date  . Diabetes mellitus   . Thyroid disease    Past Surgical History  Procedure Laterality Date  . Abdominal hysterectomy    . Vagina reconstruction surgery      tvt 2006- [place]  . Hallux valgus correction      bilateral foot surgery for bone repairs-multiple  . Cystostomy  11/22/2011    Procedure: CYSTOSTOMY SUPRAPUBIC;  Surgeon: Marissa Nestle, MD;  Location: [facility];  Service: Urology;  Laterality: N/A;  . Abdominal surgery     Allergies  Allergen Reactions  . Synthroid [Levothyroxine Sodium] Anaphylaxis  . Iodine Hives    Current Outpatient Prescriptions  Medication Sig Dispense Refill  . escitalopram (LEXAPRO) 10 MG tablet Take 1 tablet (10 mg total) by mouth every morning.    Marland Kitchen LEVEMIR FLEXTOUCH 100 UNIT/ML SOPN 28 UnitsAT BEDTIME    . lisinopril (PRINIVIL,ZESTRIL) 5 MG tablet Take 5 mg by mouth every evening.     . metFORMIN (GLUCOPHAGE) 500 MG tablet Take  500 mg by mouth 2 (two) times daily.    . NESINA 25 MG TABS  IN THE NIGHT    . pantoprazole (PROTONIX) 40 MG tablet Take 40 mg by mouth IN THE AM FOR ~1 YEAR   . traZODone (DESYREL) 100 MG tablet Take 1 tablet (100 mg total) by mouth at bedtime.    Marland Kitchen VICTOZA 18 MG/3ML SOPN 1.2 mg IN THE AM    .      .       Family History  Problem Relation Age of Onset  . Anesthesia problems Neg Hx   . Hypotension Neg Hx   . Malignant hyperthermia Neg Hx   . Pseudochol deficiency Neg Hx   . Colon polyps Neg Hx   . Colon cancer Neg Hx   . Celiac disease Neg Hx   . Pancreatic cancer Neg Hx   . Stomach cancer Neg Hx   . Ulcerative colitis Neg Hx   . Crohn's disease Neg Hx   . Asthma    . Diabetes    . Arthritis     History   Social History  . Marital Status: Single            . 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 Smoking status: Former Smoker -- 1.00 packs/day for 20 years    Types: Cigarettes    Quit date: 11/19/2001  . Smokeless  tobacco: Former Systems developer    Quit date: 10/17/2011  . Alcohol Use: No     Comment: rarely  . Drug Use: No  . Sexual Activity: Yes    Birth Control/ Protection: Surgical   Social History Narrative  . 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     Objective:   Physical Exam  Vitals reviewed. Constitutional: She is oriented to person, place, and time. She appears well-developed and well-nourished. No distress.  HENT:  Head: Normocephalic and atraumatic.  Mouth/Throat: Oropharynx is clear and moist. No oropharyngeal exudate.  Eyes: Pupils are equal, round, and reactive to light. No scleral icterus.  Neck: Normal range of motion. Neck supple.  Cardiovascular: Normal rate, regular rhythm and normal heart sounds.   Pulmonary/Chest: Effort normal and breath sounds normal. No respiratory distress.  Abdominal: Soft. Bowel sounds are normal. She  exhibits no distension. There is no tenderness.  Musculoskeletal: She exhibits no edema.  Lymphadenopathy:    She has no cervical adenopathy.  Neurological: She is alert and oriented to person, place, and time.  NO FOCAL DEFICITS   Psychiatric: She has a normal mood and affect.          Assessment & Plan:

## 2014-03-08 NOTE — Op Note (Signed)
[facility] [address] [place], [address]   ENDOSCOPY PROCEDURE REPORT  PATIENT: [name], [name]  MR#: [id number] BIRTHDATE: 02/08/1955 , 59  yrs. old GENDER: Female  ENDOSCOPIST: Barney Drain, MD REFERRED UE:[id number] Cindie Laroche, M.D.  PROCEDURE DATE: 03/08/2014 PROCEDURE:   EGD w/ biopsy  INDICATIONS:Nausea.   Unexplained diarrhea. MEDICATIONS: TCS+ Demerol 25 mg IV and Versed 2 mg IV TOPICAL ANESTHETIC:   Viscous Xylocaine  DESCRIPTION OF PROCEDURE:     Physical exam was performed.  Informed consent was obtained from the patient after explaining the benefits, risks, and alternatives to the procedure.  The patient was connected to the monitor and placed in the left lateral position.  Continuous oxygen was provided by nasal cannula and IV medicine administered through an indwelling cannula.  After administration of sedation, the patients esophagus was intubated and the EG-[id number] ([id number])  endoscope was advanced under direct visualization to the second portion of the duodenum.  The scope was removed slowly by carefully examining the color, texture, anatomy, and integrity of the mucosa on the way out.  The patient was recovered in endoscopy and discharged home in satisfactory condition.   ESOPHAGUS: The mucosa of the esophagus appeared normal.   STOMACH: Multiple small sessile polyps were found in the gastric fundus and gastric body.  Multiple biopsies was performed using cold forceps. Mild non-erosive gastritis (inflammation) was found in the gastric antrum.  Multiple biopsies were performed using cold forceps. DUODENUM: Mild duodenal inflammation was found in the bulb and second portion of the duodenum.     COLD BIOPSIES OBTAINED IN BULB/2ND PART OF DUODENUM. COMPLICATIONS:   None  ENDOSCOPIC IMPRESSION: 1.   NO SOURCE FOR DIARRHEA/NAUSEA IDENTIFIED. 2.   Multiple small sessile polyps in the gastric fundus and gastric body 3.   MILD Non-erosive  gastritis AND DUODENITIS  RECOMMENDATIONS: ADD HYOSCYAMINE 30 MINS PRIOR TO BREAKFAST DAILY TO REDUCE LOOSE STOOLS. TAKE PROTONIX 30 MINUTES PRIOR TO MEALS DAILY. AVOID ITEMS THAT TRIGGER GASTRITIS. FOLLOW A HIGH FIBER/LOW FAT/DAIRY FREE DIET.  AVOID ITEMS THAT CAUSE BLOATING. BIOPSY RESULTS WILL BE BACK IN 7 DAYS. FOLLOW UP IN 3 MOS.  Next colonoscopy IN 3-5 YEARS. CONSIDER OVERTUBE.   REPEAT EXAM: _______________________________ Lorrin MaisBarney Drain, MD 03/08/2014 11:50 AM

## 2014-03-08 NOTE — Discharge Instructions (Signed)
NO DEFINITE SOURCE FOR YOUR DIARRHEA OR NAUSEA WAS IDENTIFIED. YOU HAD 6 POLYPS REMOVED. You have DIVERTICULOSIS IN YOUR RIGHT AND LEFT COLON AND SMALL internal hemorrhoids.  YOU HAVE MILD gastritis, & DUODENITIS  AND SMALL GASTRIC POLYPS. I biopsied your  stomach, SMALL BOWEL, & colon.     ADD HYOSCYAMINE 30 MINS PRIOR TO BREAKFAST DAILY TO REDUCE LOOSE STOOLS.  IT MAY CAUSE DROWSINESS, DRY EYES/MOUTH, BLURRY VISION, OR DIFFICULTY URINATING.   TAKE PROTONIX 30 MINUTES PRIOR TO MEALS DAILY.  AVOID ITEMS THAT TRIGGER GASTRITIS. SEE INFO BELOW.  FOLLOW A HIGH FIBER/LOW FAT/DAIRY FREE DIET. AVOID ITEMS THAT CAUSE BLOATING. SEE INFO BELOW.  YOUR BIOPSY RESULTS WILL BE BACK IN 7 DAYS.  FOLLOW UP IN 3 MOS.  Next colonoscopy IN 3-5 YEARS.   ENDOSCOPY Care After Read the instructions outlined below and refer to this sheet in the next week. These discharge instructions provide you with general information on caring for yourself after you leave the hospital. While your treatment has been planned according to the most current medical practices available, unavoidable complications occasionally occur. If you have any problems or questions after discharge, call DR. Kanon Novosel, [phone].  ACTIVITY  You may resume your regular activity, but move at a slower pace for the next 24 hours.   Take frequent rest periods for the next 24 hours.   Walking will help get rid of the air and reduce the bloated feeling in your belly (abdomen).   No driving for 24 hours (because of the medicine (anesthesia) used during the test).   You may shower.   Do not sign any important legal documents or operate any machinery for 24 hours (because of the anesthesia used during the test).    NUTRITION  Drink plenty of fluids.   You may resume your normal diet as instructed by your doctor.   Begin with a light meal and progress to your normal diet. Heavy or fried foods are harder to digest and may make you feel sick  to your stomach (nauseated).   Avoid alcoholic beverages for 24 hours or as instructed.    MEDICATIONS  You may resume your normal medications.   WHAT YOU CAN EXPECT TODAY  Some feelings of bloating in the abdomen.   Passage of more gas than usual.   Spotting of blood in your stool or on the toilet paper  .  IF YOU HAD POLYPS REMOVED DURING THE ENDOSCOPY:  Eat a soft diet IF YOU HAVE NAUSEA, BLOATING, ABDOMINAL PAIN, OR VOMITING.    FINDING OUT THE RESULTS OF YOUR TEST Not all test results are available during your visit. DR. Oneida Alar WILL CALL YOU WITHIN 7 DAYS OF YOUR PROCEDUE WITH YOUR RESULTS. Do not assume everything is normal if you have not heard from DR. Reginald Weida IN ONE WEEK, CALL HER OFFICE AT [phone].  SEEK IMMEDIATE MEDICAL ATTENTION AND CALL THE OFFICE: [phone] IF:  You have more than a spotting of blood in your stool.   Your belly is swollen (abdominal distention).   You are nauseated or vomiting.   You have a temperature over 101F.   You have abdominal pain or discomfort that is severe or gets worse throughout the day.    Gastritis/DUODENITIS  Gastritis is an inflammation (the body's way of reacting to injury and/or infection) of the stomach. DUODENITIS is an inflammation (the body's way of reacting to injury and/or infection) of the FIRST PART OF THE SMALL INTESTINES. It is often caused by bacterial (germ)  infections. It can also be caused BY ASPIRIN, BC/GOODY POWDER'S, (IBUPROFEN) MOTRIN, OR ALEVE (NAPROXEN), chemicals (including alcohol), SPICY FOODS, and medications. This illness may be associated with generalized malaise (feeling tired, not well), UPPER ABDOMINAL STOMACH cramps, and fever. One common bacterial cause of gastritis is an organism known as H. Pylori. This can be treated with antibiotics.    High-Fiber Diet A high-fiber diet changes your normal diet to include more whole grains, legumes, fruits, and vegetables. Changes in the diet  involve replacing refined carbohydrates with unrefined foods. The calorie level of the diet is essentially unchanged. The Dietary Reference Intake (recommended amount) for adult males is 38 grams per day. For adult females, it is 25 grams per day. Pregnant and lactating women should consume 28 grams of fiber per day. Fiber is the intact part of a plant that is not broken down during digestion. Functional fiber is fiber that has been isolated from the plant to provide a beneficial effect in the body. PURPOSE  Increase stool bulk.   Ease and regulate bowel movements.   Lower cholesterol.  INDICATIONS THAT YOU NEED MORE FIBER  Constipation and hemorrhoids.   Uncomplicated diverticulosis (intestine condition) and irritable bowel syndrome.   Weight management.   As a protective measure against hardening of the arteries (atherosclerosis), diabetes, and cancer.   GUIDELINES FOR INCREASING FIBER IN THE DIET  Start adding fiber to the diet slowly. A gradual increase of about 5 more grams (2 slices of whole-wheat bread, 2 servings of most fruits or vegetables, or 1 bowl of high-fiber cereal) per day is best. Too rapid an increase in fiber may result in constipation, flatulence, and bloating.   Drink enough water and fluids to keep your urine clear or pale yellow. Water, juice, or caffeine-free drinks are recommended. Not drinking enough fluid may cause constipation.   Eat a variety of high-fiber foods rather than one type of fiber.   Try to increase your intake of fiber through using high-fiber foods rather than fiber pills or supplements that contain small amounts of fiber.   The goal is to change the types of food eaten. Do not supplement your present diet with high-fiber foods, but replace foods in your present diet.  INCLUDE A VARIETY OF FIBER SOURCES  Replace refined and processed grains with whole grains, canned fruits with fresh fruits, and incorporate other fiber sources. White rice,  white breads, and most bakery goods contain little or no fiber.   Brown whole-grain rice, buckwheat oats, and many fruits and vegetables are all good sources of fiber. These include: broccoli, Brussels sprouts, cabbage, cauliflower, beets, sweet potatoes, white potatoes (skin on), carrots, tomatoes, eggplant, squash, berries, fresh fruits, and dried fruits.   Cereals appear to be the richest source of fiber. Cereal fiber is found in whole grains and bran. Bran is the fiber-rich outer coat of cereal grain, which is largely removed in refining. In whole-grain cereals, the bran remains. In breakfast cereals, the largest amount of fiber is found in those with "bran" in their names. The fiber content is sometimes indicated on the label.   You may need to include additional fruits and vegetables each day.   In baking, for 1 cup white flour, you may use the following substitutions:   1 cup whole-wheat flour minus 2 tablespoons.   1/2 cup white flour plus 1/2 cup whole-wheat flour.    Lactose Free Diet Lactose is a carbohydrate that is found mainly in milk and milk products, as well  as in foods with added milk or whey. Lactose must be digested by the enzyme in order to be used by the body. Lactose intolerance occurs when there is a shortage of lactase. When your body is not able to digest lactose, you may feel sick to your stomach (nausea), bloating, cramping, gas and diarrhea.  There are many dairy products that may be tolerated better than milk by some people:  The use of cultured dairy products such as yogurt, buttermilk, cottage cheese, and sweet acidophilus milk (Kefir) for lactase-deficient individuals is usually well tolerated. This is because the healthy bacteria help digest lactose.   Lactose-hydrolyzed milk (Lactaid) contains 40-90% less lactose than milk and may also be well tolerated.    SPECIAL NOTES  Lactose is a carbohydrates. The major food source is dairy products. Reading food  labels is important. Many products contain lactose even when they are not made from milk. Look for the following words: whey, milk solids, dry milk solids, nonfat dry milk powder. Typical sources of lactose other than dairy products include breads, candies, cold cuts, prepared and processed foods, and commercial sauces and gravies.   All foods must be prepared without milk, cream, or other dairy foods.   Soy milk and lactose-free supplements (LACTASE) may be used as an alternative to milk.   FOOD GROUP ALLOWED/RECOMMENDED AVOID/USE SPARINGLY  BREADS / STARCHES 4 servings or more* Breads and rolls made without milk. [place], [place], or [place] bread. Breads and rolls that contain milk. Prepared mixes such as muffins, biscuits, waffles, pancakes. Sweet rolls, donuts, [place] toast (if made with milk or lactose).  Crackers: Soda crackers, graham crackers. Any crackers prepared without lactose. Zwieback crackers, corn curls, or any that contain lactose.  Cereals: Cooked or dry cereals prepared without lactose (read labels). Cooked or dry cereals prepared with lactose (read labels). Total, Cocoa Krispies. Special K.  Potatoes / Pasta / Rice: Any prepared without milk or lactose. Popcorn. Instant potatoes, frozen [place] fries, scalloped or au gratin potatoes.  VEGETABLES 2 servings or more Fresh, frozen, and canned vegetables. Creamed or breaded vegetables. Vegetables in a cheese sauce or with lactose-containing margarines.  FRUIT 2 servings or more All fresh, canned, or frozen fruits that are not processed with lactose. Any canned or frozen fruits processed with lactose.  MEAT & SUBSTITUTES 2 servings or more (4 to 6 oz. total per day) Plain beef, chicken, fish, [place], lamb, veal, pork, or ham. Kosher prepared meat products. Strained or junior meats that do not contain milk. Eggs, soy meat substitutes, nuts. Scrambled eggs, omelets, and souffles that contain milk. Creamed or breaded meat, fish, or  fowl. Sausage products such as wieners, liver sausage, or cold cuts that contain milk solids. Cheese, cottage cheese, or cheese spreads.  MILK None. (See BEVERAGES for milk substitutes. See DESSERTS for ice cream and frozen desserts.) Milk (whole, 2%, skim, or chocolate). Evaporated, powdered, or condensed milk; malted milk.  SOUPS & COMBINATION FOODS Bouillon, broth, vegetable soups, clear soups, consomms. Homemade soups made with allowed ingredients. Combination or prepared foods that do not contain milk or milk products (read labels). Cream soups, chowders, commercially prepared soups containing lactose. Macaroni and cheese, pizza. Combination or prepared foods that contain milk or milk products.  DESSERTS & SWEETS In moderation Water and fruit ices; gelatin; angel food cake. Homemade cookies, pies, or cakes made from allowed ingredients. Pudding (if made with water or a milk substitute). Lactose-free tofu desserts. Sugar, honey, corn syrup, jam, jelly; marmalade; molasses (beet sugar);  Pure sugar candy; marshmallows. Ice cream, ice milk, sherbet, custard, pudding, frozen yogurt. Commercial cake and cookie mixes. Desserts that contain chocolate. Pie crust made with milk-containing margarine; reduced-calorie desserts made with a sugar substitute that contains lactose. Toffee, peppermint, butterscotch, chocolate, caramels.  FATS & OILS In moderation Butter (as tolerated; contains very small amounts of lactose). Margarines and dressings that do not contain milk, Vegetable oils, shortening, Miracle Whip, mayonnaise, nondairy cream & whipped toppings without lactose or milk solids added (examples: Coffee Rich, Carnation Coffeemate, Rich's Whipped Topping, PolyRich). Berniece Salines. Margarines and salad dressings containing milk; cream, cream cheese; peanut butter with added milk solids, sour cream, chip dips, made with sour cream.  BEVERAGES Carbonated drinks; tea; coffee and freeze-dried coffee; some instant  coffees (check labels). Fruit drinks; fruit and vegetable juice; Rice or Soy milk. Ovaltine, hot chocolate. Some cocoas; some instant coffees; instant iced teas; powdered fruit drinks (read labels).   CONDIMENTS / MISCELLANEOUS Soy sauce, carob powder, olives, gravy made with water, baker's cocoa, pickles, pure seasonings and spices, wine, pure monosodium glutamate, catsup, mustard. Some chewing gums, chocolate, some cocoas. Certain antibiotics and vitamin / mineral preparations. Spice blends if they contain milk products. MSG extender. Artificial sweeteners that contain lactose such as Equal (Nutra-Sweet) and Sweet 'n Low. Some nondairy creamers (read labels).   SAMPLE MENU*  Breakfast   Orange Juice.  Banana.   Bran flakes.   Nondairy Creamer.  [place] Bread (toasted).   Butter or milk-free margarine.   Coffee or tea.    Noon Meal   Chicken Breast.  Rice.   Green beans.   Butter or milk-free margarine.  Fresh melon.   Coffee or tea.    Evening Meal   Roast Beef.  Baked potato.   Butter or milk-free margarine.   Broccoli.   Lettuce salad with vinegar and oil dressing.  [employer]   Coffee or tea.      Low-Fat Diet BREADS, CEREALS, PASTA, RICE, DRIED PEAS, AND BEANS These products are high in carbohydrates and most are low in fat. Therefore, they can be increased in the diet as substitutes for fatty foods. They too, however, contain calories and should not be eaten in excess. Cereals can be eaten for snacks as well as for breakfast.  Include foods that contain fiber (fruits, vegetables, whole grains, and legumes). Research shows that fiber may lower blood cholesterol levels, especially the water-soluble fiber found in fruits, vegetables, oat products, and legumes. FRUITS AND VEGETABLES It is good to eat fruits and vegetables. Besides being sources of fiber, both are rich in vitamins and some minerals. They help you get the daily allowances of these  nutrients. Fruits and vegetables can be used for snacks and desserts. MEATS Limit lean meat, chicken, [place], and fish to no more than 6 ounces per day. Beef, Pork, and Lamb Use lean cuts of beef, pork, and lamb. Lean cuts include:  Extra-lean ground beef.  Arm roast.  Sirloin tip.  Center-cut ham.  Round steak.  Loin chops.  Rump roast.  Tenderloin.  Trim all fat off the outside of meats before cooking. It is not necessary to severely decrease the intake of red meat, but lean choices should be made. Lean meat is rich in protein and contains a highly absorbable form of iron. Premenopausal women, in particular, should avoid reducing lean red meat because this could increase the risk for low red blood cells (iron-deficiency anemia). The organ meats, such as liver, sweetbreads, kidneys, and brain are  very rich in cholesterol. They should be limited. Chicken and [place] These are good sources of protein. The fat of poultry can be reduced by removing the skin and underlying fat layers before cooking. Chicken and [place] can be substituted for lean red meat in the diet. Poultry should not be fried or covered with high-fat sauces. Fish and Shellfish Fish is a good source of protein. Shellfish contain cholesterol, but they usually are low in saturated fatty acids. The preparation of fish is important. Like chicken and [place], they should not be fried or covered with high-fat sauces. EGGS Egg whites contain no fat or cholesterol. They can be eaten often. Try 1 to 2 egg whites instead of whole eggs in recipes or use egg substitutes that do not contain yolk. MILK AND DAIRY PRODUCTS Use skim or 1% milk instead of 2% or whole milk. Decrease whole milk, natural, and processed cheeses. Use nonfat or low-fat (2%) cottage cheese or low-fat cheeses made from vegetable oils. Choose nonfat or low-fat (1 to 2%) yogurt. Experiment with evaporated skim milk in recipes that call for heavy cream. Substitute low-fat  yogurt or low-fat cottage cheese for sour cream in dips and salad dressings. Have at least 2 servings of low-fat dairy products, such as 2 glasses of skim (or 1%) milk each day to help get your daily calcium intake.  FATS AND OILS Reduce the total intake of fats, especially saturated fat. Butterfat, lard, and beef fats are high in saturated fat and cholesterol. These should be avoided as much as possible. Vegetable fats do not contain cholesterol, but certain vegetable fats, such as coconut oil, palm oil, and palm kernel oil are very high in saturated fats. These should be limited. These fats are often used in bakery goods, processed foods, popcorn, oils, and nondairy creamers. Vegetable shortenings and some peanut butters contain hydrogenated oils, which are also saturated fats. Read the labels on these foods and check for saturated vegetable oils. Unsaturated vegetable oils and fats do not raise blood cholesterol. However, they should be limited because they are fats and are high in calories. Total fat should still be limited to 30% of your daily caloric intake. Desirable liquid vegetable oils are corn oil, cottonseed oil, olive oil, canola oil, safflower oil, soybean oil, and sunflower oil. Peanut oil is not as good, but small amounts are acceptable. Buy a heart-healthy tub margarine that has no partially hydrogenated oils in the ingredients. Mayonnaise and salad dressings often are made from unsaturated fats, but they should also be limited because of their high calorie and fat content. Seeds, nuts, peanut butter, olives, and avocados are high in fat, but the fat is mainly the unsaturated type. These foods should be limited mainly to avoid excess calories and fat. OTHER EATING TIPS Snacks  Most sweets should be limited as snacks. They tend to be rich in calories and fats, and their caloric content outweighs their nutritional value. Some good choices in snacks are graham crackers, melba toast, soda  crackers, bagels (no egg), English muffins, fruits, and vegetables. These snacks are preferable to snack crackers, [place] fries, and chips. Popcorn should be air-popped or cooked in small amounts of liquid vegetable oil. Desserts Eat fruit, low-fat yogurt, and fruit ices. AVOID pastries, cake, and cookies. Sherbet, angel food cake, gelatin dessert, frozen low-fat yogurt, or other frozen products that do not contain saturated fat (pure fruit juice bars, frozen ice pops) are also acceptable.  COOKING METHODS Choose those methods that use little or no  fat. They include: Poaching.  Braising.  Steaming.  Grilling.  Baking.  Stir-frying.  Broiling.  Microwaving.  Foods can be cooked in a nonstick pan without added fat, or use a nonfat cooking spray in regular cookware. Limit fried foods and avoid frying in saturated fat. Add moisture to lean meats by using water, broth, cooking wines, and other nonfat or low-fat sauces along with the cooking methods mentioned above. Soups and stews should be chilled after cooking. The fat that forms on top after a few hours in the refrigerator should be skimmed off. When preparing meals, avoid using excess salt. Salt can contribute to raising blood pressure in some people. EATING AWAY FROM HOME Order entres, potatoes, and vegetables without sauces or butter. When meat exceeds the size of a deck of cards (3 to 4 ounces), the rest can be taken home for another meal. Choose vegetable or fruit salads and ask for low-calorie salad dressings to be served on the side. Use dressings sparingly. Limit high-fat toppings, such as bacon, crumbled eggs, cheese, sunflower seeds, and olives. Ask for heart-healthy tub margarine instead of butter.  Hemorrhoids Hemorrhoids are dilated (enlarged) veins around the rectum. Sometimes clots will form in the veins. This makes them swollen and painful. These are called thrombosed hemorrhoids. Causes of hemorrhoids include:  Constipation.     Straining to have a bowel movement.   HEAVY LIFTING HOME CARE INSTRUCTIONS  Eat a well balanced diet and drink 6 to 8 glasses of water every day to avoid constipation. You may also use a bulk laxative.   Avoid straining to have bowel movements.   Keep anal area dry and clean.   Do not use a donut shaped pillow or sit on the toilet for long periods. This increases blood pooling and pain.   Move your bowels when your body has the urge; this will require less straining and will decrease pain and pressure.

## 2014-03-08 NOTE — Interval H&P Note (Signed)
History and Physical Interval Note:  03/08/2014 9:47 AM  [name]  has presented today for surgery, with the diagnosis of [place]  The various methods of treatment have been discussed with the patient and family. After consideration of risks, benefits and other options for treatment, the patient has consented to  Procedure(s) with comments: COLONOSCOPY (N/A) - 1015 ESOPHAGOGASTRODUODENOSCOPY (EGD) (N/A) as a surgical intervention .  The patient's history has been reviewed, patient examined, no change in status, stable for surgery.  I have reviewed the patient's chart and labs.  Questions were answered to the patient's satisfaction.     [employer]

## 2014-03-08 NOTE — Op Note (Signed)
[facility] [address] [place], [address]   COLONOSCOPY PROCEDURE REPORT  PATIENT: [name], [name]  MR#: [id number] BIRTHDATE: 02/08/1955 , 59  yrs. old GENDER: Female ENDOSCOPIST: Barney Drain, MD REFERRED [id number] Cindie Laroche, M.D. PROCEDURE DATE:  03/08/2014 PROCEDURE:   Colonoscopy with snare polypectomy, with cold biopsy polypectomy, and with biopsy INDICATIONS:Change in bowel habits. MEDICATIONS: Demerol 100 mg IV and Versed 8 mg IV  DESCRIPTION OF PROCEDURE:    Physical exam was performed.  Informed consent was obtained from the patient after explaining the benefits, risks, and alternatives to procedure.  The patient was connected to monitor and placed in left lateral position. Continuous oxygen was provided by nasal cannula and IV medicine administered through an indwelling cannula.  After administration of sedation and rectal exam, the patients rectum was intubated and the EC-[id number] ([id number])  colonoscope was advanced under direct visualization to the ileum.  The scope was removed slowly by carefully examining the color, texture, anatomy, and integrity mucosa on the way out.  The patient was recovered in endoscopy and discharged home in satisfactory condition.    COLON FINDINGS: The mucosa appeared normal in the terminal ileum.  , Three sessile polyps measuring 2-4 mm in size were found in the sigmoid colon and rectum.  A polypectomy was performed with cold forceps and with a cold snare.  The resection was complete and the polyp tissue was completely retrieved, A sessile polyp measuring 6 mm in size was found in the rectum.  A polypectomy was performed using snare cautery.  , POLYPOID LESION IN ASCENDING AND DESECENDING COLON.  COLD BIOPSIES OBTAINED, The LEFT colon IS SLIGHTLY redundant.  Manual abdominal counter-pressure was used to reach the cecum, and Small internal hemorrhoids were found. RANDOM BIOPSIES OBTAINED TO EVALUATE FOR MICROSCOPIC  COLITIS.  PREP QUALITY: good.  CECAL W/D TIME: 30 minutes     COMPLICATIONS: None  ENDOSCOPIC IMPRESSION: 1.   Normal mucosa in the terminal ileum 2.   SIX COLON POLYPS REMOVED 4.   POLYPOID LESION IN ASCENDING AND DESECENDING COLON MOST LIKELY INVERTED DIVERTICULA. 5.   The LEFT colon IS SLIGHTLY redundant 6.   Small internal hemorrhoids  RECOMMENDATIONS: ADD HYOSCYAMINE 30 MINS PRIOR TO BREAKFAST DAILY TO REDUCE LOOSE STOOLS. TAKE PROTONIX 30 MINUTES PRIOR TO MEALS DAILY. AVOID ITEMS THAT TRIGGER GASTRITIS. FOLLOW A HIGH FIBER/LOW FAT/DAIRY FREE DIET.  AVOID ITEMS THAT CAUSE BLOATING. BIOPSY RESULTS WILL BE BACK IN 7 DAYS. FOLLOW UP IN 3 MOS.  Next colonoscopy IN 3-5 YEARS. CONSIDER OVERTUBE.    _______________________________ Lorrin MaisBarney Drain, MD 03/08/2014 11:43 AM

## 2014-03-09 ENCOUNTER — Ambulatory Visit (INDEPENDENT_AMBULATORY_CARE_PROVIDER_SITE_OTHER): Payer: Managed Care, Other (non HMO) | Admitting: Psychiatry

## 2014-03-09 DIAGNOSIS — F320 Major depressive disorder, single episode, mild: Secondary | ICD-10-CM
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
## 2014-03-09 NOTE — Patient Instructions (Signed)
Discussed orally 

## 2014-03-12 ENCOUNTER — Ambulatory Visit (INDEPENDENT_AMBULATORY_CARE_PROVIDER_SITE_OTHER): Payer: Managed Care, Other (non HMO) | Admitting: Psychiatry

## 2014-03-12 ENCOUNTER — Encounter (HOSPITAL_COMMUNITY): Payer: Self-pay | Admitting: Psychiatry

## 2014-03-12 VITALS — BP 134/63 | HR 74 | Ht 67.0 in | Wt 222.2 lb

## 2014-03-12 DIAGNOSIS — F329 Major depressive disorder, single episode, unspecified: Secondary | ICD-10-CM

## 2014-03-12 DIAGNOSIS — F320 Major depressive disorder, single episode, mild: Secondary | ICD-10-CM

## 2014-03-12 MED ORDER — ESCITALOPRAM OXALATE 10 MG PO TABS: 10.0000 mg | ORAL_TABLET | ORAL | Status: DC

## 2014-03-12 MED ORDER — TRAZODONE HCL 100 MG PO TABS: ORAL_TABLET | ORAL | Status: DC
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
## 2014-03-17 ENCOUNTER — Ambulatory Visit: Payer: Self-pay | Admitting: Gastroenterology

## 2014-03-19 ENCOUNTER — Encounter (HOSPITAL_COMMUNITY): Payer: Self-pay | Admitting: Gastroenterology

## 2014-03-20 ENCOUNTER — Telehealth: Payer: Self-pay | Admitting: Gastroenterology

## 2014-03-20 NOTE — Telephone Encounter (Signed)
Please call pt. She had A SERRATED AND HYPERPLASTIC POLYPS removed. HER stomach Bx mild gastritis.  HER SMALL BOWEL BIOPSIES ARE NORMAL. HER BLOOD COUNT IS NORMAL.NO OBVIOUS SOURCE FOR HER DIARRHEA OR BLACK STOOL WAS IDENTIFIED. THE LOOSE STOOL ARE MOST LIKELY DUE TO A CHANGE IN HER DIET.   CONTINUE HER WEIGHT LOSS EFFORTS.  USE HYOSCYAMINE 30 MINS PRIOR TO BREAKFAST DAILY TO REDUCE LOOSE STOOLS.  IT MAY CAUSE DROWSINESS, DRY EYES/MOUTH, BLURRY VISION, OR DIFFICULTY URINATING.   TAKE PROTONIX 30 MINUTES PRIOR TO MEALS DAILY.  AVOID ITEMS THAT TRIGGER GASTRITIS.  FOLLOW A HIGH FIBER/LOW FAT/DAIRY FREE DIET. AVOID ITEMS THAT CAUSE BLOATING.   FOLLOW UP IN OCT 2015 E30 DIARRHEA/ABDOMINAL PAIN/MELENA. IF SHE SEES BLACK STOOL AGAIN SHE NEEDS TO CALL ME AND SHE WILL NEED A CAPSULE STUDY.   Next colonoscopy IN 5 YEARS BECAUSE SHE HAD A SERRATED ADENOMA REMOVED.

## 2014-03-22 NOTE — Telephone Encounter (Signed)
Pt is aware.  

## 2014-03-22 NOTE — Telephone Encounter (Signed)
PLEASE CALL PT. SHE CAN TAKE PROTONIX QD.

## 2014-03-22 NOTE — Telephone Encounter (Signed)
LMOM to call.

## 2014-03-22 NOTE — Telephone Encounter (Signed)
Pt called and was informed of results. She said she is only taking Protonix once a day ( it was given to her by PCP). She feels she is doing well on that, but if Dr. Oneida Alar wants her to take bid , please let her know.

## 2014-03-23 NOTE — Telephone Encounter (Signed)
Pt is aware of OV on 11/11 at 9 with SF and reminder in epic

## 2014-03-26 ENCOUNTER — Ambulatory Visit (INDEPENDENT_AMBULATORY_CARE_PROVIDER_SITE_OTHER): Payer: Managed Care, Other (non HMO) | Admitting: Psychiatry

## 2014-03-26 DIAGNOSIS — F320 Major depressive disorder, single episode, mild: Secondary | ICD-10-CM
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
## 2014-03-26 NOTE — Patient Instructions (Signed)
Discussed orally 

## 2014-04-04 ENCOUNTER — Other Ambulatory Visit (HOSPITAL_COMMUNITY): Payer: Self-pay | Admitting: Psychiatry

## 2014-04-14 ENCOUNTER — Ambulatory Visit (INDEPENDENT_AMBULATORY_CARE_PROVIDER_SITE_OTHER): Payer: Managed Care, Other (non HMO) | Admitting: Psychiatry

## 2014-04-14 DIAGNOSIS — F320 Major depressive disorder, single episode, mild: Secondary | ICD-10-CM

## 2014-04-14 NOTE — Patient Instructions (Signed)
Discussed orally 
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
## 2014-04-22 ENCOUNTER — Encounter: Payer: Self-pay | Admitting: Gastroenterology

## 2014-05-03 ENCOUNTER — Ambulatory Visit (INDEPENDENT_AMBULATORY_CARE_PROVIDER_SITE_OTHER): Payer: Managed Care, Other (non HMO) | Admitting: Psychiatry

## 2014-05-03 DIAGNOSIS — F320 Major depressive disorder, single episode, mild: Secondary | ICD-10-CM
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
## 2014-05-03 NOTE — Patient Instructions (Signed)
Discussed orally 

## 2014-05-24 ENCOUNTER — Ambulatory Visit (INDEPENDENT_AMBULATORY_CARE_PROVIDER_SITE_OTHER): Payer: Managed Care, Other (non HMO) | Admitting: Psychiatry

## 2014-05-24 DIAGNOSIS — F320 Major depressive disorder, single episode, mild: Secondary | ICD-10-CM

## 2014-05-24 NOTE — Patient Instructions (Signed)
Discussed orally 
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
## 2014-06-07 ENCOUNTER — Ambulatory Visit (INDEPENDENT_AMBULATORY_CARE_PROVIDER_SITE_OTHER): Payer: Managed Care, Other (non HMO) | Admitting: Psychiatry

## 2014-06-07 DIAGNOSIS — F320 Major depressive disorder, single episode, mild: Secondary | ICD-10-CM
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
## 2014-06-08 NOTE — Patient Instructions (Signed)
Discussed orally 

## 2014-06-09 ENCOUNTER — Ambulatory Visit: Payer: Self-pay | Admitting: Gastroenterology

## 2014-06-14 ENCOUNTER — Ambulatory Visit (INDEPENDENT_AMBULATORY_CARE_PROVIDER_SITE_OTHER): Payer: Managed Care, Other (non HMO) | Admitting: Psychiatry

## 2014-06-14 ENCOUNTER — Encounter (HOSPITAL_COMMUNITY): Payer: Self-pay | Admitting: Psychiatry

## 2014-06-14 VITALS — BP 126/77 | HR 95 | Ht 67.0 in | Wt 224.4 lb

## 2014-06-14 DIAGNOSIS — F331 Major depressive disorder, recurrent, moderate: Secondary | ICD-10-CM

## 2014-06-14 DIAGNOSIS — F329 Major depressive disorder, single episode, unspecified: Secondary | ICD-10-CM

## 2014-06-14 MED ORDER — TRAZODONE HCL 100 MG PO TABS: ORAL_TABLET | ORAL | Status: DC

## 2014-06-14 MED ORDER — ESCITALOPRAM OXALATE 10 MG PO TABS: 10.0000 mg | ORAL_TABLET | ORAL | Status: DC
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
## 2014-06-17 ENCOUNTER — Encounter: Payer: Self-pay | Admitting: Gastroenterology

## 2014-06-17 ENCOUNTER — Ambulatory Visit (INDEPENDENT_AMBULATORY_CARE_PROVIDER_SITE_OTHER): Payer: Managed Care, Other (non HMO) | Admitting: Gastroenterology

## 2014-06-17 VITALS — BP 142/84 | HR 67 | Temp 98.0°F | Ht 67.0 in | Wt 222.0 lb

## 2014-06-17 DIAGNOSIS — R194 Change in bowel habit: Secondary | ICD-10-CM

## 2014-06-17 MED ORDER — HYOSCYAMINE SULFATE ER 0.375 MG PO TB12: ORAL_TABLET | ORAL | Status: DC

## 2014-06-17 NOTE — Progress Notes (Signed)
Subjective:    Patient ID: [name], female    DOB: 02/08/1955, 58 y.o.   MRN: [id number]  HPI PT LAST SEEN AUG 2015 FOR LOOSE STOOLS/BRBPR/LLQ ABDOMINAL PAIN. HAD QUESTIONS ABOUT SEDATION-FELT AWAKE AT POINTS. THOUGHT SHE WANTED ME TO HURRY. BMs: 2 BOUTS OF EXCRUCIATING PAIN IN LEFT LOWER ABDOMEN. WATCHING DIET. HAS PERIODS OF TIME AT WORK AND LOWERS ACCESS TO WATER DUE TO INABILITY TO GO TO THE TOILET. EVER DAYS SHE HAS A GREEK SALAD AND ON THOSE DAYS NO ABDOMINAL PAIN.  MAY BE CONSTIPATED. ON THE WEEKEND WHEN SHE DOES FIBER IT GETS THINGS BACK TO NL. NOW USING COCONUT ALMOND MILK.  LOST 3 LBS SINCE LAST VISIT.   Past Medical History  Diagnosis Date  . Diabetes mellitus   . Thyroid disease    Past Surgical History  Procedure Laterality Date  . Abdominal hysterectomy    . Vagina reconstruction surgery      tvt 2006- [place]  . Hallux valgus correction      bilateral foot surgery for bone repairs-multiple  . Cystostomy  11/22/2011    Procedure: CYSTOSTOMY SUPRAPUBIC;  Surgeon: Marissa Nestle, MD;  Location: [facility];  Service: Urology;  Laterality: N/A;  . Abdominal surgery    . Colonoscopy  2005 [facility]    TICs, IH  . Colonoscopy N/A 03/08/2014    Procedure: COLONOSCOPY;  Surgeon: Danie Binder, MD;  Location: [facility];  Service: Endoscopy;  Laterality: N/A;  1015  . Esophagogastroduodenoscopy N/A 03/08/2014    Procedure: ESOPHAGOGASTRODUODENOSCOPY (EGD);  Surgeon: Danie Binder, MD;  Location: [facility];  Service: Endoscopy;  Laterality: N/A;   Allergies  Allergen Reactions  . Synthroid [Levothyroxine Sodium] Anaphylaxis  . Iodine Hives   Current Outpatient Prescriptions  Medication Sig Dispense Refill  . escitalopram (LEXAPRO) 10 MG tablet Take 1 tablet (10 mg total) by mouth every morning.    . hyoscyamine (LEVBID) 0.375 MG 12 hr tablet TAKE ONE 30 MINS PRIOR TO BREAKFAST    . LEVEMIR FLEXTOUCH 100 UNIT/ML SOPN Inject 28 Units into the skin daily.       Marland Kitchen lisinopril (PRINIVIL,ZESTRIL) 5 MG tablet Take 10 mg by mouth every evening.     . metFORMIN (GLUCOPHAGE) 500 MG tablet Take 500 mg by mouth 2 (two) times daily.    . NESINA 25 MG TABS Take 25 mg by mouth daily.     . pantoprazole (PROTONIX) 40 MG tablet Take 40 mg by mouth daily.    . traZODone (DESYREL) 100 MG tablet Take 2 at bedtime    . VICTOZA 18 MG/3ML SOPN Inject 1.2 mg into the skin daily.        Review of Systems     Objective:   Physical Exam  Constitutional: She is oriented to person, place, and time. She appears well-developed and well-nourished. No distress.  HENT:  Head: Normocephalic and atraumatic.  Mouth/Throat: Oropharynx is clear and moist. No oropharyngeal exudate.  Eyes: Pupils are equal, round, and reactive to light. No scleral icterus.  Neck: Normal range of motion. Neck supple.  Cardiovascular: Normal rate, regular rhythm and normal heart sounds.   Pulmonary/Chest: Effort normal and breath sounds normal. No respiratory distress.  Abdominal: Soft. Bowel sounds are normal. She exhibits no distension. There is no tenderness.  Musculoskeletal: She exhibits no edema.  Lymphadenopathy:    She has no cervical adenopathy.  Neurological: She is alert and oriented to person, place, and time.  NO FOCAL  DEFICITS   Psychiatric: She has a normal mood and affect.  Vitals reviewed.         Assessment & Plan:

## 2014-06-17 NOTE — Assessment & Plan Note (Signed)
MOST LIKELY DUE TO FOOD INTOLERANCE. SX IMPROVED WITH DIET MODIFICATION..  CONTINUE TO MONITOR SYMPTOMS. CONTINUE WEIGHT LOSS EFFORTS & DIET MODIFICATION LEVBID QAM AND MAY REPEAT IN AFTERNOON FOR ABDOMINAL PAIN OR CRAMPS MAY USE TYLENOL IF NEED FOR DISCOMFORT OR PAIN. PT WILL CALL WITH QUESTIONS OR CONCERNS.

## 2014-06-17 NOTE — Patient Instructions (Signed)
CONTINUE YOUR WEIGHT LOSS EFFORTS & DIET MODIFICATION  USE LEVBID BEFORE BREAKFAST. YOU MAY REPEAT IN AFTERNOON AS NEEDED FOR ABDOMINAL PAIN OR CRAMPS.  USE TYLENOL IF NEED FOR DISCOMFORT OR PAIN.  PLEASE CALL WITH QUESTIONS OR CONCERNS.

## 2014-06-17 NOTE — Progress Notes (Signed)
cc'ed to pcp °

## 2014-06-30 ENCOUNTER — Ambulatory Visit (INDEPENDENT_AMBULATORY_CARE_PROVIDER_SITE_OTHER): Payer: Managed Care, Other (non HMO) | Admitting: Psychiatry

## 2014-06-30 DIAGNOSIS — F320 Major depressive disorder, single episode, mild: Secondary | ICD-10-CM

## 2014-06-30 NOTE — Patient Instructions (Signed)
Discussed orally 
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
## 2014-07-16 DIAGNOSIS — R350 Frequency of micturition: Secondary | ICD-10-CM | POA: Insufficient documentation

## 2014-09-14 ENCOUNTER — Encounter (HOSPITAL_COMMUNITY): Payer: Self-pay | Admitting: Psychiatry

## 2014-09-14 ENCOUNTER — Ambulatory Visit (INDEPENDENT_AMBULATORY_CARE_PROVIDER_SITE_OTHER): Payer: Managed Care, Other (non HMO) | Admitting: Psychiatry

## 2014-09-14 VITALS — BP 167/88 | HR 69 | Ht 67.0 in | Wt 229.0 lb

## 2014-09-14 DIAGNOSIS — F331 Major depressive disorder, recurrent, moderate: Secondary | ICD-10-CM

## 2014-09-14 DIAGNOSIS — F329 Major depressive disorder, single episode, unspecified: Secondary | ICD-10-CM

## 2014-09-14 MED ORDER — ESCITALOPRAM OXALATE 10 MG PO TABS: 10.0000 mg | ORAL_TABLET | ORAL | Status: DC

## 2014-09-14 MED ORDER — TRAZODONE HCL 100 MG PO TABS
ORAL_TABLET | ORAL | Status: DC
Start: 2014-09-14 — End: 2014-12-13
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
## 2014-12-13 ENCOUNTER — Encounter (HOSPITAL_COMMUNITY): Payer: Self-pay | Admitting: Psychiatry

## 2014-12-13 ENCOUNTER — Ambulatory Visit (INDEPENDENT_AMBULATORY_CARE_PROVIDER_SITE_OTHER): Payer: Managed Care, Other (non HMO) | Admitting: Psychiatry

## 2014-12-13 VITALS — BP 154/85 | HR 81 | Wt 233.0 lb

## 2014-12-13 DIAGNOSIS — F329 Major depressive disorder, single episode, unspecified: Secondary | ICD-10-CM | POA: Diagnosis not present

## 2014-12-13 DIAGNOSIS — F331 Major depressive disorder, recurrent, moderate: Secondary | ICD-10-CM

## 2014-12-13 MED ORDER — ESCITALOPRAM OXALATE 10 MG PO TABS: 10.0000 mg | ORAL_TABLET | ORAL | Status: DC

## 2014-12-13 MED ORDER — TRAZODONE HCL 100 MG PO TABS: ORAL_TABLET | ORAL | Status: DC
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
## 2015-03-18 ENCOUNTER — Other Ambulatory Visit (HOSPITAL_COMMUNITY): Payer: Self-pay | Admitting: Psychiatry

## 2015-04-15 ENCOUNTER — Ambulatory Visit (INDEPENDENT_AMBULATORY_CARE_PROVIDER_SITE_OTHER): Payer: Managed Care, Other (non HMO) | Admitting: Psychiatry

## 2015-04-15 ENCOUNTER — Encounter (HOSPITAL_COMMUNITY): Payer: Self-pay | Admitting: Psychiatry

## 2015-04-15 VITALS — BP 157/92 | HR 80 | Ht 67.0 in | Wt 238.0 lb

## 2015-04-15 DIAGNOSIS — F329 Major depressive disorder, single episode, unspecified: Secondary | ICD-10-CM | POA: Diagnosis not present

## 2015-04-15 DIAGNOSIS — F331 Major depressive disorder, recurrent, moderate: Secondary | ICD-10-CM

## 2015-04-15 MED ORDER — ESCITALOPRAM OXALATE 10 MG PO TABS: 10.0000 mg | ORAL_TABLET | ORAL | Status: DC

## 2015-04-15 MED ORDER — TRAZODONE HCL 100 MG PO TABS: ORAL_TABLET | ORAL | Status: DC
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
## 2015-10-09 ENCOUNTER — Other Ambulatory Visit (HOSPITAL_COMMUNITY): Payer: Self-pay | Admitting: Psychiatry

## 2015-10-13 ENCOUNTER — Encounter (HOSPITAL_COMMUNITY): Payer: Self-pay | Admitting: Psychiatry

## 2015-10-13 ENCOUNTER — Ambulatory Visit (INDEPENDENT_AMBULATORY_CARE_PROVIDER_SITE_OTHER): Payer: Managed Care, Other (non HMO) | Admitting: Psychiatry

## 2015-10-13 VITALS — BP 156/93 | HR 80 | Ht 67.0 in | Wt 253.4 lb

## 2015-10-13 DIAGNOSIS — F331 Major depressive disorder, recurrent, moderate: Secondary | ICD-10-CM

## 2015-10-13 MED ORDER — TRAZODONE HCL 100 MG PO TABS: ORAL_TABLET | ORAL | Status: DC

## 2015-10-13 MED ORDER — ESCITALOPRAM OXALATE 10 MG PO TABS: 10.0000 mg | ORAL_TABLET | ORAL | Status: DC
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
## 2016-03-26 ENCOUNTER — Other Ambulatory Visit (HOSPITAL_COMMUNITY): Payer: Self-pay | Admitting: Psychiatry

## 2016-03-28 ENCOUNTER — Telehealth (HOSPITAL_COMMUNITY): Payer: Self-pay | Admitting: *Deleted

## 2016-03-28 NOTE — Telephone Encounter (Signed)
LEFT VOICE MESSAGE, PROVIDER OUT OF OFFICE ON 04/13/16.

## 2016-04-10 ENCOUNTER — Encounter: Payer: Self-pay | Admitting: Orthopedic Surgery

## 2016-04-10 ENCOUNTER — Ambulatory Visit (INDEPENDENT_AMBULATORY_CARE_PROVIDER_SITE_OTHER): Payer: Managed Care, Other (non HMO)

## 2016-04-10 ENCOUNTER — Ambulatory Visit (INDEPENDENT_AMBULATORY_CARE_PROVIDER_SITE_OTHER): Payer: Managed Care, Other (non HMO) | Admitting: Orthopedic Surgery

## 2016-04-10 VITALS — BP 159/86 | HR 99 | Ht 66.0 in | Wt 250.0 lb

## 2016-04-10 DIAGNOSIS — M25561 Pain in right knee: Secondary | ICD-10-CM

## 2016-04-10 DIAGNOSIS — M129 Arthropathy, unspecified: Secondary | ICD-10-CM

## 2016-04-10 DIAGNOSIS — M1711 Unilateral primary osteoarthritis, right knee: Secondary | ICD-10-CM

## 2016-04-10 NOTE — Progress Notes (Signed)
Chief Complaint  Patient presents with  . Knee Pain    right knee pain   HPI   Right knee pain  1/10 Stabbing Felt a pop And feels popping. No prior treatment Aching   Review of Systems  Endo/Heme/Allergies: Positive for environmental allergies.    Past Medical History:  Diagnosis Date  . Diabetes mellitus   . Thyroid disease     Past Surgical History:  Procedure Laterality Date  . ABDOMINAL HYSTERECTOMY    . ABDOMINAL SURGERY    . COLONOSCOPY  2005 MAC   TICs, IH  . COLONOSCOPY N/A 03/08/2014   Procedure: COLONOSCOPY;  Surgeon: Danie Binder, MD;  Location: [facility];  Service: Endoscopy;  Laterality: N/A;  1015  . CYSTOSTOMY  11/22/2011   Procedure: CYSTOSTOMY SUPRAPUBIC;  Surgeon: Marissa Nestle, MD;  Location: [facility];  Service: Urology;  Laterality: N/A;  . ESOPHAGOGASTRODUODENOSCOPY N/A 03/08/2014   Procedure: ESOPHAGOGASTRODUODENOSCOPY (EGD);  Surgeon: Danie Binder, MD;  Location: [facility];  Service: Endoscopy;  Laterality: N/A;  . HALLUX VALGUS CORRECTION     bilateral foot surgery for bone repairs-multiple  . VAGINA RECONSTRUCTION SURGERY     tvt 2006- [place]   Family History  Problem Relation Age of Onset  . Anesthesia problems Neg Hx   . Hypotension Neg Hx   . Malignant hyperthermia Neg Hx   . Pseudochol deficiency Neg Hx   . Colon polyps Neg Hx   . Colon cancer Neg Hx   . Celiac disease Neg Hx   . Pancreatic cancer Neg Hx   . Stomach cancer Neg Hx   . Ulcerative colitis Neg Hx   . Crohn's disease Neg Hx   . Asthma    . Diabetes    . Arthritis     Social History  Substance Use Topics  . Smoking status: Former Smoker    Packs/day: 1.00    Years: 20.00    Types: Cigarettes    Quit date: 11/19/2001  . Smokeless tobacco: Former Systems developer    Quit date: 10/17/2011  . Alcohol use No     Comment: rarely   Current Meds  Medication Sig  . lisinopril (PRINIVIL,ZESTRIL) 5 MG tablet Take 10 mg by mouth every evening.   . metFORMIN  (GLUCOPHAGE) 500 MG tablet Take 500 mg by mouth 2 (two) times daily.    BP (!) 159/86   Pulse 99   Ht '5\' 6"'$  (1.676 m)   Wt 250 lb (113.4 kg)   BMI 40.35 kg/m   Physical Exam  Constitutional: She is oriented to person, place, and time. She appears well-developed and well-nourished. No distress.  Cardiovascular: Normal rate and intact distal pulses.   Neurological: She is alert and oriented to person, place, and time. She has normal reflexes. She exhibits normal muscle tone. Coordination normal.  Skin: Skin is warm and dry. No rash noted. She is not diaphoretic. No erythema. No pallor.  Psychiatric: She has a normal mood and affect. Her behavior is normal. Judgment and thought content normal.    Right Knee Exam   Tenderness  The patient is experiencing tenderness in the medial joint line (Crepitation patellofemoral joint).  Range of Motion  Extension: normal  Flexion: normal   Muscle Strength   The patient has normal right knee strength.  Tests  McMurray:  Medial - positive Lateral - negative Drawer:       Anterior - negative    Posterior - negative Varus: negative Valgus:  negative  Other  Erythema: absent Scars: absent Sensation: normal Pulse: present Swelling: none   Left Knee Exam   Tenderness  The patient is experiencing no tenderness (Crepitation patellofemoral joint).     Range of Motion  Extension: normal  Flexion: normal   Muscle Strength   The patient has normal left knee strength.  Tests  McMurray:  Medial - negative Lateral - negative Drawer:       Anterior - negative     Posterior - negative Varus: negative Valgus: negative  Other  Erythema: absent Scars: absent Sensation: normal Pulse: present Swelling: none       ASSESSMENT: My personal interpretation of the images:  Mild arthritis right knee    PLAN Observation made aware of any meniscal signs that may develop  Arther Abbott, MD 04/10/2016 1:51 PM  .meds

## 2016-04-13 ENCOUNTER — Ambulatory Visit (HOSPITAL_COMMUNITY): Payer: Self-pay | Admitting: Psychiatry

## 2016-04-23 ENCOUNTER — Encounter (HOSPITAL_COMMUNITY): Payer: Self-pay | Admitting: Psychiatry

## 2016-04-23 ENCOUNTER — Ambulatory Visit (INDEPENDENT_AMBULATORY_CARE_PROVIDER_SITE_OTHER): Payer: Managed Care, Other (non HMO) | Admitting: Psychiatry

## 2016-04-23 VITALS — BP 162/96 | HR 83 | Ht 66.0 in | Wt 250.0 lb

## 2016-04-23 DIAGNOSIS — F331 Major depressive disorder, recurrent, moderate: Secondary | ICD-10-CM

## 2016-04-23 MED ORDER — TRAZODONE HCL 100 MG PO TABS: 200.0000 mg | ORAL_TABLET | Freq: Every day | ORAL | 5 refills | Status: DC

## 2016-04-23 MED ORDER — ESCITALOPRAM OXALATE 10 MG PO TABS: 10.0000 mg | ORAL_TABLET | Freq: Every morning | ORAL | 5 refills | Status: DC
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
## 2016-05-29 ENCOUNTER — Encounter: Payer: Self-pay | Admitting: Endocrinology

## 2016-05-29 ENCOUNTER — Ambulatory Visit (INDEPENDENT_AMBULATORY_CARE_PROVIDER_SITE_OTHER): Payer: Managed Care, Other (non HMO) | Admitting: Endocrinology

## 2016-05-29 DIAGNOSIS — I10 Essential (primary) hypertension: Secondary | ICD-10-CM | POA: Diagnosis not present

## 2016-05-29 MED ORDER — GLUCOSE BLOOD VI STRP: 1.0000 | ORAL_STRIP | Freq: Two times a day (BID) | 12 refills | Status: DC

## 2016-05-29 MED ORDER — INSULIN LISPRO 100 UNIT/ML (KWIKPEN): PEN_INJECTOR | SUBCUTANEOUS | 11 refills | Status: DC

## 2016-05-29 NOTE — Patient Instructions (Addendum)
good diet and exercise significantly improve the control of your diabetes.  please let me know if you wish to be referred to a dietician.  high blood sugar is very risky to your health.  you should see an eye doctor and dentist every year.  It is very important to get all recommended vaccinations.  Controlling your blood pressure and cholesterol drastically reduces the damage diabetes does to your body.  Those who smoke should quit.  Please discuss these with your doctor.  check your blood sugar twice a day.  vary the time of day when you check, between before the 3 meals, and at bedtime.  also check if you have symptoms of your blood sugar being too high or too low.  please keep a record of the readings and bring it to your next appointment here (or you can bring the meter itself).  You can write it on any piece of paper.  please call us sooner if your blood sugar goes below 70, or if you have a lot of readings over 200.   For now, Please continue the same metformin, and:  Change levemir to humalog 3 times a day (just before each meal) 05-02-09 units.  If you skip lunch, you should also skip the lunch shot.  Here are 2 new meters.  I have sent a prescription to your pharmacy, for strips.  Please come back for a follow-up appointment in 2-3 weeks/.

## 2016-05-29 NOTE — Progress Notes (Signed)
Subjective:    Patient ID: [name], female    DOB: 02/08/1955, 60 y.o.   MRN: [id number]  HPI pt states DM was dx'ed in 2016; she has mild if any neuropathy of the lower extremities; she is unaware of any associated chronic complications; she has been on insulin since soon after dx; pt says his diet and exercise are; she has never had GDM (G0), pancreatitis, severe hypoglycemia or DKA.  She takes levemir and metformin.  She has been increasing the levemir, based on cbg's.  She stopped victoza, due to lack of effect.  she brings a record of her cbg's which i have reviewed today.  It varies from 137-275.  There is no trend based on day of week.  It is highest at hs, and fasting.   Past Medical History:  Diagnosis Date  . Diabetes mellitus   . Thyroid disease     Past Surgical History:  Procedure Laterality Date  . ABDOMINAL HYSTERECTOMY    . ABDOMINAL SURGERY    . COLONOSCOPY  2005 MAC   TICs, IH  . COLONOSCOPY N/A 03/08/2014   Procedure: COLONOSCOPY;  Surgeon: Danie Binder, MD;  [facility];  Service: Endoscopy;  Laterality: N/A;  1015  . CYSTOSTOMY  11/22/2011   Procedure: CYSTOSTOMY SUPRAPUBIC;  Surgeon: Marissa Nestle, MD;  Location: [facility];  Service: Urology;  Laterality: N/A;  . ESOPHAGOGASTRODUODENOSCOPY N/A 03/08/2014   Procedure: ESOPHAGOGASTRODUODENOSCOPY (EGD);  Surgeon: Danie Binder, MD;  [facility];  Service: Endoscopy;  Laterality: N/A;  . HALLUX VALGUS CORRECTION     bilateral foot surgery for bone repairs-multiple  . VAGINA RECONSTRUCTION SURGERY     tvt 2006- [place] History   Social History  . Marital status: Single    Spouse name: N/A  . Number of children: N/A  . Years of education: N/A   Occupational History  . Not on file.   Social History Main Topics  . Smoking status: Former Smoker    Packs/day: 1.00    Years: 20.00    Types: Cigarettes    Quit date: 11/19/2001  . Smokeless tobacco: Former  Systems developer    Quit date: 10/17/2011  . Alcohol use No     Comment: rarely  . Drug use: No  . Sexual activity: Yes    Birth control/ protection: Surgical   Other Topics Concern  . Not on file   Social History Narrative  . No narrative on file    Current Outpatient Prescriptions on File Prior to Visit  Medication Sig Dispense Refill  . escitalopram (LEXAPRO) 10 MG tablet Take 1 tablet (10 mg total) by mouth every morning. 30 tablet 5  . lisinopril (PRINIVIL,ZESTRIL) 5 MG tablet Take 40 mg by mouth every evening.     . metFORMIN (GLUCOPHAGE) 500 MG tablet Take 1,000 mg by mouth 2 (two) times daily.     . NESINA 25 MG TABS Take 25 mg by mouth daily.     . pantoprazole (PROTONIX) 40 MG tablet Take 40 mg by mouth daily.    . traZODone (DESYREL) 100 MG tablet Take 2 tablets (200 mg total) by mouth at bedtime. 60 tablet 5   No current facility-administered medications on file prior to visit.     Allergies  Allergen Reactions  . Synthroid [Levothyroxine Sodium] Anaphylaxis  . Iodine Hives    Family History  Problem Relation Age of Onset  . Asthma    .  Diabetes    . Arthritis    . Anesthesia problems Neg Hx   . Hypotension Neg Hx   . Malignant hyperthermia Neg Hx   . Pseudochol deficiency Neg Hx   . Colon polyps Neg Hx   . Colon cancer Neg Hx   . Celiac disease Neg Hx   . Pancreatic cancer Neg Hx   . Stomach cancer Neg Hx   . Ulcerative colitis Neg Hx   . Crohn's disease Neg Hx     BP (!) 147/84   Pulse (!) 102   Ht '5\' 6"'$  (1.676 m)   Wt 249 lb (112.9 kg)   BMI 40.19 kg/m     Review of Systems denies blurry vision, chest pain, sob, n/v, excessive diaphoresis, and rhinorrhea.  She has intermittent headache, leg cramps, chronic weight gain, cold intolerance, easy bruising, and urinary frequency.  Depression is well-controlled.       Objective:   Physical Exam VS: see vs page GEN: no distress HEAD: head: no deformity eyes: no periorbital swelling, no  proptosis external nose and ears are normal mouth: no lesion seen NECK: supple, thyroid is not enlarged CHEST WALL: no deformity LUNGS: clear to auscultation CV: reg rate and rhythm, no murmur ABD: abdomen is soft, nontender.  no hepatosplenomegaly.  not distended.  no hernia MUSCULOSKELETAL: muscle bulk and strength are grossly normal.  no obvious joint swelling.  gait is normal and steady EXTEMITIES: no deformity.  no ulcer on the feet.  feet are of normal color and temp.  Trace bilat leg edema PULSES: dorsalis pedis intact bilat.  no carotid bruit NEURO:  cn 2-12 grossly intact.   readily moves all 4's.  sensation is intact to touch on the feet SKIN:  Normal texture and temperature.  No rash or suspicious lesion is visible.   NODES:  None palpable at the neck PSYCH: alert, well-oriented.  Does not appear anxious nor depressed.   A1c=9.9%  I have reviewed outside records, and summarized: Pt was noted to have elevated a1c, and referred here.  Pt was changed from victoza to nesina, but pt did not give reason.  Nutritional goals were addressed.     Assessment & Plan:  Insulin-requiring type 2 DM, new to me: she needs increased rx.  We discussed.  She agrees to take multiple daily injections   Patient is advised the following: Patient Instructions  good diet and exercise significantly improve the control of your diabetes.  please let me know if you wish to be referred to a dietician.  high blood sugar is very risky to your health.  you should see an eye doctor and dentist every year.  It is very important to get all recommended vaccinations.  Controlling your blood pressure and cholesterol drastically reduces the damage diabetes does to your body.  Those who smoke should quit.  Please discuss these with your doctor.  check your blood sugar twice a day.  vary the time of day when you check, between before the 3 meals, and at bedtime.  also check if you have symptoms of your blood sugar  being too high or too low.  please keep a record of the readings and bring it to your next appointment here (or you can bring the meter itself).  You can write it on any piece of paper.  please call us sooner if your blood sugar goes below 70, or if you have a lot of readings over 200.   For now, Please continue  the same metformin, and:  Change levemir to humalog 3 times a day (just before each meal) 05-02-09 units.  If you skip lunch, you should also skip the lunch shot.  Here are 2 new meters.  I have sent a prescription to your pharmacy, for strips.  Please come back for a follow-up appointment in 2-3 weeks/.

## 2016-05-30 ENCOUNTER — Telehealth: Payer: Self-pay | Admitting: Endocrinology

## 2016-05-30 MED ORDER — GLUCOSE BLOOD VI STRP: 1.0000 | ORAL_STRIP | Freq: Two times a day (BID) | 12 refills | Status: DC

## 2016-05-30 NOTE — Telephone Encounter (Signed)
Refill for test strips resubmitted to the [facility].

## 2016-05-30 NOTE — Telephone Encounter (Signed)
Patient stated she has the meter but didn't get the test strips call to the pharmacy  [facility]    [id number] [place] [phone](Phone) [phone](Fax)

## 2016-05-31 ENCOUNTER — Ambulatory Visit: Payer: Managed Care, Other (non HMO) | Attending: Family Medicine | Admitting: Physical Therapy

## 2016-05-31 DIAGNOSIS — M62838 Other muscle spasm: Secondary | ICD-10-CM | POA: Diagnosis present

## 2016-05-31 DIAGNOSIS — G8929 Other chronic pain: Secondary | ICD-10-CM

## 2016-05-31 DIAGNOSIS — I10 Essential (primary) hypertension: Secondary | ICD-10-CM | POA: Insufficient documentation

## 2016-05-31 DIAGNOSIS — M25651 Stiffness of right hip, not elsewhere classified: Secondary | ICD-10-CM | POA: Diagnosis present

## 2016-05-31 DIAGNOSIS — M5440 Lumbago with sciatica, unspecified side: Secondary | ICD-10-CM | POA: Insufficient documentation

## 2016-05-31 DIAGNOSIS — M25652 Stiffness of left hip, not elsewhere classified: Secondary | ICD-10-CM | POA: Diagnosis present

## 2016-05-31 NOTE — Patient Instructions (Addendum)
Perform all exercises below:  Hold _20___ seconds. Repeat _3___ times.  Do __3__ sessions per day. CAUTION: Movement should be gentle, steady and slow.  Knee to Chest  Lying supine, bend involved knee to chest. Perform with each leg.  Copyright  VHI. All rights reserved.  Double Knee to Chest (Flexion)   Gently pull both knees toward chest. Feel stretch in lower back or buttock area. Breathing deeply, Lumbar Rotation: Caudal - Bilateral (Supine)  Feet and knees together, arms outstretched, rotate knees left, turning head in opposite direction, until stretch is felt.      HIP: Hamstrings - Short Sitting   Rest leg on raised surface. Keep knee straight. Lift chest.   [facility] [address], [place], [place] [address] Phone # [phone] Fax [phone]

## 2016-05-31 NOTE — Therapy (Addendum)
[facility] [address], [place], [place], [address] Phone: [phone]   Fax:  [phone]  Physical Therapy Evaluation  Patient Details  Name: [name] MRN: [id number] Date of Birth: 02/08/1955 Referring Provider: Lujean Amel, MD  Encounter Date: 05/31/2016      PT End of Session - 05/31/16 0944    Visit Number 1   Date for PT Re-Evaluation 07/26/16   PT Start Time 0852   PT Stop Time 0928   PT Time Calculation (min) 36 min   Activity Tolerance Patient tolerated treatment well   Behavior During Therapy [facility] for tasks assessed/performed      Past Medical History:  Diagnosis Date  . Diabetes mellitus   . Thyroid disease     Past Surgical History:  Procedure Laterality Date  . ABDOMINAL HYSTERECTOMY    . ABDOMINAL SURGERY    . COLONOSCOPY  2005 MAC   TICs, IH  . COLONOSCOPY N/A 03/08/2014   Procedure: COLONOSCOPY;  Surgeon: Danie Binder, MD;  [facility];  Service: Endoscopy;  Laterality: N/A;  1015  . CYSTOSTOMY  11/22/2011   Procedure: CYSTOSTOMY SUPRAPUBIC;  Surgeon: Marissa Nestle, MD;  [facility];  Service: Urology;  Laterality: N/A;  . ESOPHAGOGASTRODUODENOSCOPY N/A 03/08/2014   Procedure: ESOPHAGOGASTRODUODENOSCOPY (EGD);  Surgeon: Danie Binder, MD;  [facility];  Service: Endoscopy;  Laterality: N/A;  . HALLUX VALGUS CORRECTION     bilateral foot surgery for bone repairs-multiple  . VAGINA RECONSTRUCTION SURGERY     tvt 2006- [place]    There were no vitals filed for this visit.       Subjective Assessment - 05/31/16 0857    Subjective Pt states has had sacroiliac issues for years, states she stretches all the time, but now she is getting sharp pain in "the middle back" and when that occurs the anterior thigh aches.  States this has been progressing over the past 6 months, denies any trauma.  Pain does not radiate past knee, it is a throbbing  ache on R thigh   Pertinent History Pt has had    Limitations Sitting;Standing   How long can you sit comfortably? sleeping face up   Currently in Pain? Yes   Pain Score 0-No pain  4/10 yesterday   Pain Location Back  R leg   Pain Orientation Right   Pain Descriptors / Indicators Aching;Throbbing   Pain Type Chronic pain   Pain Radiating Towards R knee down R thigh   Pain Onset More than a month ago   Pain Frequency Intermittent   Aggravating Factors  sitting, standing   Pain Relieving Factors moving, walking            [facility] PT Assessment - 05/31/16 0001      Assessment   Medical Diagnosis M54.5 Low back pain   Referring Provider Dibas Koirala, MD   Onset Date/Surgical Date 11/29/15  gradual onset   Prior Therapy none recent     Precautions   Precautions None     Restrictions   Weight Bearing Restrictions No     Balance Screen   Has the patient fallen in the past 6 months No   Has the patient had a decrease in activity level because of a fear of falling?  No   Is the patient reluctant to leave their home because of a fear of falling?  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     Cognition   Overall Cognitive Status Within Functional Limits for tasks assessed     Observation/Other Assessments   Observations No gait abnormalities, stiff when getting up from [employer] on Therapeutic Outcomes (FOTO)  29%     ROM / Strength   AROM / PROM / Strength Strength;PROM;AROM     AROM   Overall AROM Comments 50% R lumbar SB     PROM   Overall PROM Comments limited by 25-50% for hip IR/ER     Strength   Overall Strength Within functional limits for tasks performed   Overall Strength Comments 5/5 MMT BLE     Palpation   Spinal  mobility thoracic spine limited, no palpable tenderness in spine, R glutes TTP, palpable trigger points in piriformis and glute med     Special Tests   Lumbar Tests Slump Test     Slump test   Findings Negative   Side Right   Comment negative bilateral                           PT Education - 05/31/16 0922    Education provided Yes   Education Details lumbar/hip flexion exercises   Person(s) Educated Patient   Methods Explanation;Tactile cues;Verbal cues;Handout   Comprehension Verbalized understanding          PT Short Term Goals - 05/31/16 0956      PT SHORT TERM GOAL #1   Title independent with initial HEP   Time 2   Period Weeks   Status New           PT Long Term Goals - 05/31/16 1020      PT LONG TERM GOAL #1   Title independent with advanced HEP   Time 8   Period Weeks   Status New     PT LONG TERM GOAL #2   Title pain reduced to 0/10 throughout work day   Time 8   Period Weeks   Status New     PT LONG TERM GOAL #3   Title increased hip IR/ER to WNL for improved functional movement and decreased pain   Time 8   Period Weeks   Status New     PT LONG TERM GOAL #4   Title --               Plan - 05/31/16 0946    Clinical Impression Statement Pt presents to clinic with chronic low back pain that has recently flared causing radiating pain to anterior R thigh.  Patient was seen for low complexity PT eval today.  Pt has significant stiffness of bilateral hip roation as well as stiffness in lumbar and thoracic spine noted during AROM and palpation.  She also presents with trigger points in glutes and piriformis on R side.  Pt will benefit from skilled PT for techniques to increase joint mobility, lengthen short/tight muscle tissue and re-educate movements with greater ROM.    Rehab Potential Good   PT Frequency 2x / week   PT Duration 8 weeks   PT Treatment/Interventions Moist Heat;Therapeutic exercise;Therapeutic  activities;Functional mobility training;Stair training;Gait training;Neuromuscular re-education;Patient/family education;Manual techniques;Passive range of motion;Dry needling  PT Next Visit Plan PT will begin dry needling to glutes and review initial HEP, progress ROM as tolerated   PT Home Exercise Plan initial HEP given, will progress as tolerated   Consulted and Agree with Plan of Care Patient      Patient will benefit from skilled therapeutic intervention in order to improve the following deficits and impairments:  Pain, Postural dysfunction, Decreased mobility, Hypomobility, Decreased range of motion, Impaired flexibility  Visit Diagnosis: Chronic right-sided low back pain with sciatica, sciatica laterality unspecified - Plan: PT plan of care cert/re-cert  Stiffness of left hip, not elsewhere classified - Plan: PT plan of care cert/re-cert  Stiffness of right hip, not elsewhere classified - Plan: PT plan of care cert/re-cert  Other muscle spasm - Plan: PT plan of care cert/re-cert     Problem List Patient Active Problem List   Diagnosis Date Noted  . Change in bowel habits 03/03/2014  . Depression 10/13/2013  . Fractured lateral malleolus 08/12/2012  . Left knee sprain 08/12/2012   Lovett Calender, PT 05/31/16 10:22 AM Sigurd Sos, PT 05/31/16 10:22 AM  [facility] [address] [place], [place], [address] Phone: [phone]   Fax:  [phone]  Name: [name] MRN: [id number] Date of Birth: 02/08/1955

## 2016-06-06 ENCOUNTER — Ambulatory Visit: Payer: Managed Care, Other (non HMO) | Admitting: Physical Therapy

## 2016-06-06 ENCOUNTER — Encounter: Payer: Self-pay | Admitting: Physical Therapy

## 2016-06-06 DIAGNOSIS — G8929 Other chronic pain: Secondary | ICD-10-CM

## 2016-06-06 DIAGNOSIS — M62838 Other muscle spasm: Secondary | ICD-10-CM

## 2016-06-06 DIAGNOSIS — M25651 Stiffness of right hip, not elsewhere classified: Secondary | ICD-10-CM

## 2016-06-06 DIAGNOSIS — M25652 Stiffness of left hip, not elsewhere classified: Secondary | ICD-10-CM

## 2016-06-06 DIAGNOSIS — M5440 Lumbago with sciatica, unspecified side: Secondary | ICD-10-CM | POA: Diagnosis not present

## 2016-06-06 NOTE — Therapy (Signed)
[facility] [address], [place], [place], [address] Phone: [phone]   Fax:  [phone]  Physical Therapy Treatment  Patient Details  Name: [name] MRN: [id number] Date of Birth: 02/08/1955 Referring Provider: Lujean Amel, MD  Encounter Date: 06/06/2016      PT End of Session - 06/06/16 0856    Visit Number 2   Date for PT Re-Evaluation 07/26/16   PT Start Time 0847   PT Stop Time 0930   PT Time Calculation (min) 43 min   Activity Tolerance Patient tolerated treatment well   Behavior During Therapy [facility] for tasks assessed/performed      Past Medical History:  Diagnosis Date  . Diabetes mellitus   . Thyroid disease     Past Surgical History:  Procedure Laterality Date  . ABDOMINAL HYSTERECTOMY    . ABDOMINAL SURGERY    . COLONOSCOPY  2005 MAC   TICs, IH  . COLONOSCOPY N/A 03/08/2014   Procedure: COLONOSCOPY;  Surgeon: Danie Binder, MD;  [facility];  Service: Endoscopy;  Laterality: N/A;  1015  . CYSTOSTOMY  11/22/2011   Procedure: CYSTOSTOMY SUPRAPUBIC;  Surgeon: Marissa Nestle, MD;  [facility];  Service: Urology;  Laterality: N/A;  . ESOPHAGOGASTRODUODENOSCOPY N/A 03/08/2014   Procedure: ESOPHAGOGASTRODUODENOSCOPY (EGD);  Surgeon: Danie Binder, MD;  [facility];  Service: Endoscopy;  Laterality: N/A;  . HALLUX VALGUS CORRECTION     bilateral foot surgery for bone repairs-multiple  . VAGINA RECONSTRUCTION SURGERY     tvt 2006- [place]    There were no vitals filed for this visit.      Subjective Assessment - 06/06/16 0854    Subjective Doing my HEP and it makes me feel better.    Currently in Pain? No/denies  Just this AM when she woke up.   Multiple Pain Sites No                         [facility] Adult PT Treatment/Exercise - 06/06/16 0001      Therapeutic Activites    Therapeutic Activities --  verbal ed in sleeping  posistions & seated posture with props     Lumbar Exercises: Stretches   Active Hamstring Stretch 3 reps;20 seconds   Single Knee to Chest Stretch 3 reps;20 seconds   Single Knee to Chest Stretch Limitations Then knee to opposite shoulder 3x20 sec   Lower Trunk Rotation --  15 sec rocking, then static holds 3 breaths bil     Lumbar Exercises: Supine   Other Supine Lumbar Exercises Gluteal release with spikey ball in prep for DN                  PT Short Term Goals - 06/06/16 1324      PT SHORT TERM GOAL #1   Title independent with initial HEP   Time 2   Period Weeks   Status Achieved           PT Long Term Goals - 05/31/16 1020      PT LONG TERM GOAL #1   Title independent with advanced HEP   Time 8   Period Weeks   Status New     PT LONG TERM GOAL #2   Title pain reduced to 0/10 throughout work day   Time 8   Period Weeks   Status New     PT LONG TERM GOAL #  3   Title increased hip IR/ER to WNL for improved functional movement and decreased pain   Time 8   Period Weeks   Status New     PT LONG TERM GOAL #4   Title --               Plan - 06/06/16 0857    Clinical Impression Statement Pt independent in initial HEP and finds it is helping reduce her pain. Pt has excellent technique on her stretches, little cuing needed today on her form. Pt was educated in sleeping positions using pillows and hways to support the lumbar spine in sitting. Met STG today.     Rehab Potential Good   PT Frequency 2x / week   PT Duration 8 weeks   PT Treatment/Interventions Moist Heat;Therapeutic exercise;Therapeutic activities;Functional mobility training;Stair training;Gait training;Neuromuscular re-education;Patient/family education;Manual techniques;Passive range of motion;Dry needling   PT Next Visit Plan Flexibility exercises, dry needling next visit.    Consulted and Agree with Plan of Care Patient      Patient will benefit from skilled therapeutic  intervention in order to improve the following deficits and impairments:  Pain, Postural dysfunction, Decreased mobility, Hypomobility, Decreased range of motion, Impaired flexibility  Visit Diagnosis: Chronic right-sided low back pain with sciatica, sciatica laterality unspecified  Stiffness of left hip, not elsewhere classified  Stiffness of right hip, not elsewhere classified  Other muscle spasm     Problem List Patient Active Problem List   Diagnosis Date Noted  . HTN (hypertension) 05/31/2016  . Change in bowel habits 03/03/2014  . Depression 10/13/2013  . Fractured lateral malleolus 08/12/2012  . Left knee sprain 08/12/2012    Ashlley Booher, PTA 06/06/2016, 9:24 AM  [facility] [address], [place], [place], [address] Phone: [phone]   Fax:  [phone]  Name: [name] MRN: [id number] Date of Birth: 02/08/1955

## 2016-06-12 ENCOUNTER — Ambulatory Visit: Payer: Managed Care, Other (non HMO) | Admitting: Physical Therapy

## 2016-06-12 DIAGNOSIS — M5440 Lumbago with sciatica, unspecified side: Secondary | ICD-10-CM | POA: Diagnosis not present

## 2016-06-12 DIAGNOSIS — M25651 Stiffness of right hip, not elsewhere classified: Secondary | ICD-10-CM

## 2016-06-12 DIAGNOSIS — M62838 Other muscle spasm: Secondary | ICD-10-CM

## 2016-06-12 DIAGNOSIS — G8929 Other chronic pain: Secondary | ICD-10-CM

## 2016-06-12 NOTE — Therapy (Signed)
[facility] [address], [place], [place], [address] Phone: [phone]   Fax:  [phone]  Physical Therapy Treatment  Patient Details  Name: [name] MRN: [id number] Date of Birth: 02/08/1955 Referring Provider: Lujean Amel, MD  Encounter Date: 06/12/2016      PT End of Session - 06/12/16 1258    Visit Number 3   Date for PT Re-Evaluation 07/26/16   PT Start Time 1215   PT Stop Time 3810   PT Time Calculation (min) 50 min   Activity Tolerance Patient tolerated treatment well      Past Medical History:  Diagnosis Date  . Diabetes mellitus   . Thyroid disease     Past Surgical History:  Procedure Laterality Date  . ABDOMINAL HYSTERECTOMY    . ABDOMINAL SURGERY    . COLONOSCOPY  2005 MAC   TICs, IH  . COLONOSCOPY N/A 03/08/2014   Procedure: COLONOSCOPY;  Surgeon: Danie Binder, MD;  [facility];  Service: Endoscopy;  Laterality: N/A;  1015  . CYSTOSTOMY  11/22/2011   Procedure: CYSTOSTOMY SUPRAPUBIC;  Surgeon: Marissa Nestle, MD;  Location: [facility];  Service: Urology;  Laterality: N/A;  . ESOPHAGOGASTRODUODENOSCOPY N/A 03/08/2014   Procedure: ESOPHAGOGASTRODUODENOSCOPY (EGD);  Surgeon: Danie Binder, MD;  [facility];  Service: Endoscopy;  Laterality: N/A;  . HALLUX VALGUS CORRECTION     bilateral foot surgery for bone repairs-multiple  . VAGINA RECONSTRUCTION SURGERY     tvt 2006- [place]    There were no vitals filed for this visit.      Subjective Assessment - 06/12/16 1215    Subjective (P)  I'm a little bit better.  The last session we talked about adding a seat cushion to my truck and a spiky ball.     Currently in Pain? (P)  No/denies   Pain Score (P)  0-No pain   Pain Location (P)  Hip   Pain Orientation (P)  Right   Pain Type (P)  Chronic pain   Pain Onset (P)  More than a month ago   Pain Frequency (P)  Intermittent                          [facility] Adult PT Treatment/Exercise - 06/12/16 0001      Knee/Hip Exercises: Stretches   Piriformis Stretch Right;1 rep;20 seconds   Other Knee/Hip Stretches review home stretches:  SKTC, lumbar rotation     Knee/Hip Exercises: Sidelying   Clams 15x right      Modalities   Modalities Moist Heat     Moist Heat Therapy   Number Minutes Moist Heat 10 Minutes   Moist Heat Location Hip     Manual Therapy   Joint Mobilization right hip long axis distraction, inferior, AP in IR; sidelying neutral gapping, pelvic distraction 3x 20 sec each grade 3   Soft tissue mobilization right gluteals, piriformis   Muscle Energy Technique right piriformis contract/relax 3x 5 sec hold          Trigger Point Dry Needling - 06/12/16 1258    Consent Given? Yes   Education Handout Provided Yes   Muscles Treated Lower Body Gluteus minimus;Gluteus maximus;Piriformis   Gluteus Maximus Response Palpable increased muscle length   Gluteus Minimus Response Palpable increased muscle length   Piriformis Response Palpable increased muscle length      Right side only.  PT Education - 06/12/16 1254    Education provided Yes   Education Details clam;  dry needling after care   Person(s) Educated Patient   Methods Explanation;Demonstration;Handout   Comprehension Verbalized understanding;Returned demonstration          PT Short Term Goals - 06/12/16 1303      PT SHORT TERM GOAL #1   Title independent with initial HEP   Status Achieved           PT Long Term Goals - 06/12/16 1303      PT LONG TERM GOAL #1   Title independent with advanced HEP   Time 8   Period Weeks   Status On-going     PT LONG TERM GOAL #2   Title pain reduced to 0/10 throughout work day   Time 8   Period Weeks   Status On-going     PT LONG TERM GOAL #3   Title increased hip IR/ER to WNL for improved functional movement and decreased pain   Time 8   Period Weeks    Status On-going               Plan - 06/12/16 1259    Clinical Impression Statement The patient has tender points in gluteals and piriformis on right and limited hip external rotation.  She is receptive to dry needling and following this and manual techniques she has much improved muscle length and external rotation ROM.  Difficulty activating glut medius muscle, tends to compensate with lumbar rotation.   Therapist closely montoring response with all interventions.     PT Next Visit Plan assess response to DN #1 to right hip only (may need DN to lumbar multifidi and left hip PRN);  progress glut strengthening ex's;  manual techniques as needed;  moist heat       Patient will benefit from skilled therapeutic intervention in order to improve the following deficits and impairments:     Visit Diagnosis: Chronic right-sided low back pain with sciatica, sciatica laterality unspecified  Stiffness of right hip, not elsewhere classified  Other muscle spasm     Problem List Patient Active Problem List   Diagnosis Date Noted  . HTN (hypertension) 05/31/2016  . Change in bowel habits 03/03/2014  . Depression 10/13/2013  . Fractured lateral malleolus 08/12/2012  . Left knee sprain 08/12/2012   Ruben Im, PT 06/12/16 1:06 PM Phone: [phone] Fax: [phone]  Alvera Singh 06/12/2016, 1:05 PM  [facility] [address], [place], [place], [address] Phone: [phone]   Fax:  [phone]  Name: [name] MRN: [id number] Date of Birth: 02/08/1955

## 2016-06-12 NOTE — Patient Instructions (Signed)
Abduction: Clam (Eccentric) - Side-Lying   Lie on side with knees bent.  Tip forward, like you are reaching across the bed.  Lengthen your leg outward then lift top knee, keeping feet together. Keep trunk steady and don't lift your knee too high.   Slowly lower for 3-5 seconds. _10-15_ reps per set, __1_ sets per day, __7_ days per week. Add theraband around thighs when easy.    Copyright  VHI. All rights reserved.    Trigger Point Dry Needling  . What is Trigger Point Dry Needling (DN)? o DN is a physical therapy technique used to treat muscle pain and dysfunction. Specifically, DN helps deactivate muscle trigger points (muscle knots).  o A thin filiform needle is used to penetrate the skin and stimulate the underlying trigger point. The goal is for a local twitch response (LTR) to occur and for the trigger point to relax. No medication of any kind is injected during the procedure.   . What Does Trigger Point Dry Needling Feel Like?  o The procedure feels different for each individual patient. Some patients report that they do not actually feel the needle enter the skin and overall the process is not painful. Very mild bleeding may occur. However, many patients feel a deep cramping in the muscle in which the needle was inserted. This is the local twitch response.   Marland Kitchen How Will I feel after the treatment? o Soreness is normal, and the onset of soreness may not occur for a few hours. Typically this soreness does not last longer than two days.  o Bruising is uncommon, however; ice can be used to decrease any possible bruising.  o In rare cases feeling tired or nauseous after the treatment is normal. In addition, your symptoms may get worse before they get better, this period will typically not last longer than 24 hours.   . What Can I do After My Treatment? o Increase your hydration by drinking more water for the next 24 hours. o You may place ice or heat on the areas treated that have become  sore, however, do not use heat on inflamed or bruised areas. Heat often brings more relief post needling. o You can continue your regular activities, but vigorous activity is not recommended initially after the treatment for 24 hours. o DN is best combined with other physical therapy such as strengthening, stretching, and other therapies.    Ruben Im PT [facility] [address], [place], [place] [address] Phone # [phone] Fax [phone]

## 2016-06-14 ENCOUNTER — Encounter: Payer: Self-pay | Admitting: Physical Therapy

## 2016-06-14 ENCOUNTER — Ambulatory Visit (INDEPENDENT_AMBULATORY_CARE_PROVIDER_SITE_OTHER): Payer: Managed Care, Other (non HMO) | Admitting: Endocrinology

## 2016-06-14 ENCOUNTER — Ambulatory Visit: Payer: Managed Care, Other (non HMO) | Admitting: Physical Therapy

## 2016-06-14 ENCOUNTER — Encounter: Payer: Self-pay | Admitting: Endocrinology

## 2016-06-14 DIAGNOSIS — M62838 Other muscle spasm: Secondary | ICD-10-CM

## 2016-06-14 DIAGNOSIS — M25651 Stiffness of right hip, not elsewhere classified: Secondary | ICD-10-CM

## 2016-06-14 DIAGNOSIS — G8929 Other chronic pain: Secondary | ICD-10-CM

## 2016-06-14 DIAGNOSIS — E119 Type 2 diabetes mellitus without complications: Secondary | ICD-10-CM

## 2016-06-14 DIAGNOSIS — M5440 Lumbago with sciatica, unspecified side: Secondary | ICD-10-CM | POA: Diagnosis not present

## 2016-06-14 DIAGNOSIS — M25652 Stiffness of left hip, not elsewhere classified: Secondary | ICD-10-CM

## 2016-06-14 DIAGNOSIS — Z794 Long term (current) use of insulin: Secondary | ICD-10-CM

## 2016-06-14 MED ORDER — INSULIN DETEMIR 100 UNIT/ML FLEXPEN: 4.0000 [IU] | PEN_INJECTOR | Freq: Every day | SUBCUTANEOUS | 11 refills | Status: DC

## 2016-06-14 MED ORDER — INSULIN LISPRO 100 UNIT/ML (KWIKPEN): PEN_INJECTOR | SUBCUTANEOUS | 11 refills | Status: DC

## 2016-06-14 NOTE — Therapy (Signed)
[facility] [address] [place], [place], [address] Phone: [phone]   Fax:  [phone]  Physical Therapy Treatment  Patient Details  Name: [name] MRN: [id number] Date of Birth: 02/08/1955 Referring Provider: Lujean Amel, MD  Encounter Date: 06/14/2016      PT End of Session - 06/14/16 0837    Visit Number 4   Date for PT Re-Evaluation 07/26/16   PT Start Time 0837   PT Stop Time 0926   PT Time Calculation (min) 49 min   Activity Tolerance Patient tolerated treatment well   Behavior During Therapy [facility] for tasks assessed/performed      Past Medical History:  Diagnosis Date  . Diabetes mellitus   . Thyroid disease     Past Surgical History:  Procedure Laterality Date  . ABDOMINAL HYSTERECTOMY    . ABDOMINAL SURGERY    . COLONOSCOPY  2005 MAC   TICs, IH  . COLONOSCOPY N/A 03/08/2014   Procedure: COLONOSCOPY;  Surgeon: Danie Binder, MD;  [facility];  Service: Endoscopy;  Laterality: N/A;  1015  . CYSTOSTOMY  11/22/2011   Procedure: CYSTOSTOMY SUPRAPUBIC;  Surgeon: Marissa Nestle, MD;  Location: [facility];  Service: Urology;  Laterality: N/A;  . ESOPHAGOGASTRODUODENOSCOPY N/A 03/08/2014   Procedure: ESOPHAGOGASTRODUODENOSCOPY (EGD);  Surgeon: Danie Binder, MD;  [facility];  Service: Endoscopy;  Laterality: N/A;  . HALLUX VALGUS CORRECTION     bilateral foot surgery for bone repairs-multiple  . VAGINA RECONSTRUCTION SURGERY     tvt 2006- [place]    There were no vitals filed for this visit.      Subjective Assessment - 06/14/16 0838    Subjective Pt states she did not have any pain or soreness after dry needling.  Reports she has not had any pain since Tuesday.  States she got a cushion to take pressure off coccyx and has been using it in her truck.   Currently in Pain? No/denies                         [facility] Adult PT  Treatment/Exercise - 06/14/16 0001      Lumbar Exercises: Stretches   Active Hamstring Stretch 3 reps;20 seconds   Single Knee to Chest Stretch 3 reps;20 seconds   Single Knee to Chest Stretch Limitations Then knee to opposite shoulder 3x20 sec   Lower Trunk Rotation --  15 sec rocking, then static holds 3 breaths bil on red ball     Lumbar Exercises: Aerobic   Stationary Bike Level 1; 6 minute     Lumbar Exercises: Supine   Ab Set 10 reps  pelvic tilt   Heel Slides 10 reps  both LE simultaneous on red physioball   Bridge 10 reps  mini bridge with pelvic tilt     Knee/Hip Exercises: Standing   Walking with Sports Cord side step - 20x each way yellow band     Knee/Hip Exercises: Sidelying   Clams 15x right - yellow band     Manual Therapy   Joint Mobilization pelvic distraction, posterior tilt to sacrum 3x20sec grade III   Soft tissue mobilization right gluteals, piriformis, Rt hip rotators attachment to greater trochanter, multifidus palpated with no trigger points located                  PT Short Term Goals - 06/12/16 1303      PT  SHORT TERM GOAL #1   Title independent with initial HEP   Status Achieved           PT Long Term Goals - 06/12/16 1303      PT LONG TERM GOAL #1   Title independent with advanced HEP   Time 8   Period Weeks   Status On-going     PT LONG TERM GOAL #2   Title pain reduced to 0/10 throughout work day   Time 8   Period Weeks   Status On-going     PT LONG TERM GOAL #3   Title increased hip IR/ER to WNL for improved functional movement and decreased pain   Time 8   Period Weeks   Status On-going               Plan - 06/14/16 0912    Clinical Impression Statement Pt had tender points on Rt gluteals and piriformis that were released with trigger point release techniques.  She will continue to use spiky ball for self massage to glutes.  Pt has band and will add to clamshells and add side stepping to HEP.  Pt did  well with new exercises; needed cues to contract lower abdominals and avoid overactivation of lumbar erectors.  Pt will continue to need skilled PT to progress core stability and gluteal strength in order to improve stability in functional movements and avoid recurrence of muscle spasms.   Rehab Potential Good   PT Frequency 2x / week   PT Duration 8 weeks   PT Treatment/Interventions Moist Heat;Therapeutic exercise;Therapeutic activities;Functional mobility training;Stair training;Gait training;Neuromuscular re-education;Patient/family education;Manual techniques;Passive range of motion;Dry needling   PT Next Visit Plan   progress gluteal strengthening ex's;  manual techniques as needed;  moist heat    PT Home Exercise Plan initial HEP given, added clamshells and side stepping with band, will progress as tolerated   Consulted and Agree with Plan of Care Patient      Patient will benefit from skilled therapeutic intervention in order to improve the following deficits and impairments:  Pain, Postural dysfunction, Decreased mobility, Hypomobility, Decreased range of motion, Impaired flexibility  Visit Diagnosis: Chronic right-sided low back pain with sciatica, sciatica laterality unspecified  Stiffness of right hip, not elsewhere classified  Other muscle spasm  Stiffness of left hip, not elsewhere classified     Problem List Patient Active Problem List   Diagnosis Date Noted  . HTN (hypertension) 05/31/2016  . Change in bowel habits 03/03/2014  . Depression 10/13/2013  . Fractured lateral malleolus 08/12/2012  . Left knee sprain 08/12/2012    Zannie Cove, PT 06/14/2016, 11:30 AM  [facility] [address] [place], [place], [address] Phone: [phone]   Fax:  [phone]  Name: [name] MRN: [id number] Date of Birth: 02/08/1955

## 2016-06-14 NOTE — Patient Instructions (Addendum)
check your blood sugar twice a day.  vary the time of day when you check, between before the 3 meals, and at bedtime.  also check if you have symptoms of your blood sugar being too high or too low.  please keep a record of the readings and bring it to your next appointment here (or you can bring the meter itself).  You can write it on any piece of paper.  please call us sooner if your blood sugar goes below 70, or if you have a lot of readings over 200.   Please resume the levemir, at 4 units at bedtime, and: increase humalog 3 times a day (just before each meal) 07-04-09 units.  If you skip lunch, you should also skip the lunch shot.  Please come back for a follow-up appointment in 2 months.

## 2016-06-14 NOTE — Progress Notes (Signed)
Subjective:    Patient ID: [name], female    DOB: 02/08/1955, 60 y.o.   MRN: [id number]  HPI Pt returns for f/u of diabetes mellitus: DM type: Insulin-requiring type 2 Dx'ed: 8546 Complications: none Therapy: insulin since soon after dx GDM: never (G0) DKA: never Severe hypoglycemia: never Pancreatitis: never Other: she takes multiple daily injections; she stopped victoza, due to lack of effect Interval history: she brings a record of her cbg's which i have reviewed today.  It varies from 126 (HS) to 200's (other times).  She says it is higher fasting than at HS, despite not eating at HS.  Past Medical History:  Diagnosis Date  . Diabetes mellitus   . Thyroid disease     Past Surgical History:  Procedure Laterality Date  . ABDOMINAL HYSTERECTOMY    . ABDOMINAL SURGERY    . COLONOSCOPY  2005 MAC   TICs, IH  . COLONOSCOPY N/A 03/08/2014   Procedure: COLONOSCOPY;  Surgeon: Danie Binder, MD;  [facility];  Service: Endoscopy;  Laterality: N/A;  1015  . CYSTOSTOMY  11/22/2011   Procedure: CYSTOSTOMY SUPRAPUBIC;  Surgeon: Marissa Nestle, MD;  Location: [facility];  Service: Urology;  Laterality: N/A;  . ESOPHAGOGASTRODUODENOSCOPY N/A 03/08/2014   Procedure: ESOPHAGOGASTRODUODENOSCOPY (EGD);  Surgeon: Danie Binder, MD;  [facility];  Service: Endoscopy;  Laterality: N/A;  . HALLUX VALGUS CORRECTION     bilateral foot surgery for bone repairs-multiple  . VAGINA RECONSTRUCTION SURGERY     tvt 2006- [place] History   Social History  . Marital status: Single    Spouse name: N/A  . Number of children: N/A  . Years of education: N/A   Occupational History  . Not on file.   Social History Main Topics  . Smoking status: Former Smoker    Packs/day: 1.00    Years: 20.00    Types: Cigarettes    Quit date: 11/19/2001  . Smokeless tobacco: Former Systems developer    Quit date: 10/17/2011  . Alcohol use No     Comment: rarely  . Drug use:  No  . Sexual activity: Yes    Birth control/ protection: Surgical   Other Topics Concern  . Not on file   Social History Narrative  . No narrative on file    Current Outpatient Prescriptions on File Prior to Visit  Medication Sig Dispense Refill  . amLODipine (NORVASC) 5 MG tablet Take 5 mg by mouth daily.    Marland Kitchen escitalopram (LEXAPRO) 10 MG tablet Take 1 tablet (10 mg total) by mouth every morning. 30 tablet 5  . glucose blood (ONETOUCH VERIO) test strip 1 each by Other route 2 (two) times daily. And lancets 2/day 100 each 12  . lisinopril (PRINIVIL,ZESTRIL) 5 MG tablet Take 40 mg by mouth every evening.     . metFORMIN (GLUCOPHAGE) 500 MG tablet Take 1,000 mg by mouth 2 (two) times daily.     . pantoprazole (PROTONIX) 40 MG tablet Take 40 mg by mouth daily.    . traZODone (DESYREL) 100 MG tablet Take 2 tablets (200 mg total) by mouth at bedtime. 60 tablet 5   No current facility-administered medications on file prior to visit.     Allergies  Allergen Reactions  . Synthroid [Levothyroxine Sodium] Anaphylaxis  . Iodine Hives  . Povidone-Iodine Itching and Rash    Family History  Problem Relation Age of Onset  . Asthma    .  Diabetes    . Arthritis    . Anesthesia problems Neg Hx   . Hypotension Neg Hx   . Malignant hyperthermia Neg Hx   . Pseudochol deficiency Neg Hx   . Colon polyps Neg Hx   . Colon cancer Neg Hx   . Celiac disease Neg Hx   . Pancreatic cancer Neg Hx   . Stomach cancer Neg Hx   . Ulcerative colitis Neg Hx   . Crohn's disease Neg Hx     BP 114/78   Pulse (!) 108   Ht '5\' 6"'$  (1.676 m)   Wt 250 lb 8 oz (113.6 kg)   SpO2 97%   BMI 40.43 kg/m   Review of Systems she denies hypoglycemia    Objective:   Physical Exam VITAL SIGNS:  See vs page GENERAL: no distress SKIN:  Insulin injection sites at the triceps areas are normal.        Assessment & Plan:  Insulin-requiring type 2 DM: Based on the pattern of her cbg's, she needs HS long-acting  insulin.   Patient is advised the following: Patient Instructions  check your blood sugar twice a day.  vary the time of day when you check, between before the 3 meals, and at bedtime.  also check if you have symptoms of your blood sugar being too high or too low.  please keep a record of the readings and bring it to your next appointment here (or you can bring the meter itself).  You can write it on any piece of paper.  please call us sooner if your blood sugar goes below 70, or if you have a lot of readings over 200.   Please resume the levemir, at 4 units at bedtime, and: increase humalog 3 times a day (just before each meal) 07-04-09 units.  If you skip lunch, you should also skip the lunch shot.  Please come back for a follow-up appointment in 2 months.

## 2016-06-16 DIAGNOSIS — E119 Type 2 diabetes mellitus without complications: Secondary | ICD-10-CM | POA: Insufficient documentation

## 2016-06-18 ENCOUNTER — Ambulatory Visit: Payer: Managed Care, Other (non HMO) | Admitting: Physical Therapy

## 2016-06-18 ENCOUNTER — Encounter: Payer: Self-pay | Admitting: Physical Therapy

## 2016-06-18 DIAGNOSIS — M25651 Stiffness of right hip, not elsewhere classified: Secondary | ICD-10-CM

## 2016-06-18 DIAGNOSIS — M62838 Other muscle spasm: Secondary | ICD-10-CM

## 2016-06-18 DIAGNOSIS — M25652 Stiffness of left hip, not elsewhere classified: Secondary | ICD-10-CM

## 2016-06-18 DIAGNOSIS — M5440 Lumbago with sciatica, unspecified side: Secondary | ICD-10-CM | POA: Diagnosis not present

## 2016-06-18 DIAGNOSIS — G8929 Other chronic pain: Secondary | ICD-10-CM

## 2016-06-18 NOTE — Therapy (Signed)
[facility] [address], [address] , [place], [address] Phone: [phone]   Fax:  [phone]  Physical Therapy Treatment  Patient Details  Name: [name] MRN: [id number] Date of Birth: 02/08/1955 Referring Provider: Dibas Koirala, MD  Encounter Date: 06/18/2016      PT End of Session - 06/18/16 0830    Visit Number 5   Date for PT Re-Evaluation 07/26/16   PT Start Time 0825   PT Stop Time 0905   PT Time Calculation (min) 40 min   Activity Tolerance Patient tolerated treatment well   Behavior During Therapy [facility] for tasks assessed/performed      Past Medical History:  Diagnosis Date  . Diabetes mellitus   . Thyroid disease     Past Surgical History:  Procedure Laterality Date  . ABDOMINAL HYSTERECTOMY    . ABDOMINAL SURGERY    . COLONOSCOPY  2005 MAC   TICs, IH  . COLONOSCOPY N/A 03/08/2014   Procedure: COLONOSCOPY;  Surgeon: Sandi L Fields, MD;  [facility];  Service: Endoscopy;  Laterality: N/A;  1015  . CYSTOSTOMY  11/22/2011   Procedure: CYSTOSTOMY SUPRAPUBIC;  Surgeon: Mohammad I Javaid, MD;  Location: [facility];  Service: Urology;  Laterality: N/A;  . ESOPHAGOGASTRODUODENOSCOPY N/A 03/08/2014   Procedure: ESOPHAGOGASTRODUODENOSCOPY (EGD);  Surgeon: Sandi L Fields, MD;  [facility];  Service: Endoscopy;  Laterality: N/A;  . HALLUX VALGUS CORRECTION     bilateral foot surgery for bone repairs-multiple  . VAGINA RECONSTRUCTION SURGERY     tvt 2006- [place]    There were no vitals filed for this visit.      Subjective Assessment - 06/18/16 0826    Subjective I am doing well. Overall I am 60% improved since the beginning.    Currently in Pain? No/denies   Multiple Pain Sites No                         [facility] Adult PT Treatment/Exercise - 06/18/16 0001      Lumbar Exercises: Stretches   Active Hamstring Stretch 3 reps;20 seconds   Single Knee to  Chest Stretch 3 reps;20 seconds   Single Knee to Chest Stretch Limitations Then knee to opposite shoulder 3x20 sec   Piriformis Stretch --  Release with ball prior to strength.     Lumbar Exercises: Aerobic   Stationary Bike Nustep L1 x 10 min     Lumbar Exercises: Quadruped   Opposite Arm/Leg Raise Limitations Donkey kick RT 2x 10     Knee/Hip Exercises: Stretches   Piriformis Stretch Right;1 rep;20 seconds     Knee/Hip Exercises: Sidelying   Clams 2 x15 red                 PT Education - 06/18/16 0906    Education provided Yes   Education Details red band clams, donkey kicks, and resisted standing side steps with squat for HEP advancement   Person(s) Educated Patient   Methods Explanation;Demonstration;Tactile cues;Verbal cues   Comprehension Verbalized understanding;Returned demonstration          PT Short Term Goals - 06/12/16 1303      PT SHORT TERM GOAL #1   Title independent with initial HEP   Status Achieved           PT Long Term Goals - 06/18/16 0831      PT LONG TERM GOAL #1   Title independent   with advanced HEP   Time 8   Period Weeks   Status On-going     PT LONG TERM GOAL #2   Title pain reduced to 0/10 throughout work day   Time 8   Period Weeks   Status Partially Met  Depends on how much sitting I do     PT LONG TERM GOAL #3   Title increased hip IR/ER to WNL for improved functional movement and decreased pain   Time 8   Period Weeks   Status On-going               Plan - 06/18/16 0830    Clinical Impression Statement Pt reports she feels at least 60% improved since eval. Her evening pain/ache is essentially abolished at this time, the only limitations is alot of sitting can increase piriformis pain.  Pt had excellent techniqu when performing her gluteal strengthening exercises.    Rehab Potential Good   PT Frequency 2x / week   PT Duration 8 weeks   PT Next Visit Plan   progress glut strengthening ex's;  flexibility  for hips, manual if needed.   Consulted and Agree with Plan of Care Patient      Patient will benefit from skilled therapeutic intervention in order to improve the following deficits and impairments:  Pain, Postural dysfunction, Decreased mobility, Hypomobility, Decreased range of motion, Impaired flexibility  Visit Diagnosis: Chronic right-sided low back pain with sciatica, sciatica laterality unspecified  Stiffness of right hip, not elsewhere classified  Other muscle spasm  Stiffness of left hip, not elsewhere classified     Problem List Patient Active Problem List   Diagnosis Date Noted  . Diabetes ([place]) 06/16/2016  . HTN (hypertension) 05/31/2016  . Change in bowel habits 03/03/2014  . Depression 10/13/2013  . Fractured lateral malleolus 08/12/2012  . Left knee sprain 08/12/2012    Karrissa Parchment, PTA 06/18/2016, 9:07 AM  [facility] [address] [place], [place], [address] Phone: [phone]   Fax:  [phone]  Name: [name] MRN: [id number] Date of Birth: 02/08/1955

## 2016-06-20 ENCOUNTER — Ambulatory Visit: Payer: Managed Care, Other (non HMO)

## 2016-06-20 DIAGNOSIS — G8929 Other chronic pain: Secondary | ICD-10-CM

## 2016-06-20 DIAGNOSIS — M5440 Lumbago with sciatica, unspecified side: Secondary | ICD-10-CM

## 2016-06-20 DIAGNOSIS — M25651 Stiffness of right hip, not elsewhere classified: Secondary | ICD-10-CM

## 2016-06-20 DIAGNOSIS — M62838 Other muscle spasm: Secondary | ICD-10-CM

## 2016-06-20 DIAGNOSIS — M25652 Stiffness of left hip, not elsewhere classified: Secondary | ICD-10-CM

## 2016-06-20 NOTE — Therapy (Signed)
[facility] [address] [place], [place], [address] Phone: [phone]   Fax:  [phone]  Physical Therapy Treatment  Patient Details  Name: [name] MRN: [id number] Date of Birth: 02/08/1955 Referring Provider: Lujean Amel, MD  Encounter Date: 06/20/2016      PT End of Session - 06/20/16 0922    Visit Number 6   Date for PT Re-Evaluation 07/26/16   PT Start Time 0838   PT Stop Time 0922   PT Time Calculation (min) 44 min   Activity Tolerance Patient tolerated treatment well   Behavior During Therapy [facility] for tasks assessed/performed      Past Medical History:  Diagnosis Date  . Diabetes mellitus   . Thyroid disease     Past Surgical History:  Procedure Laterality Date  . ABDOMINAL HYSTERECTOMY    . ABDOMINAL SURGERY    . COLONOSCOPY  2005 MAC   TICs, IH  . COLONOSCOPY N/A 03/08/2014   Procedure: COLONOSCOPY;  Surgeon: Danie Binder, MD;  [facility];  Service: Endoscopy;  Laterality: N/A;  1015  . CYSTOSTOMY  11/22/2011   Procedure: CYSTOSTOMY SUPRAPUBIC;  Surgeon: Marissa Nestle, MD;  Location: [facility];  Service: Urology;  Laterality: N/A;  . ESOPHAGOGASTRODUODENOSCOPY N/A 03/08/2014   Procedure: ESOPHAGOGASTRODUODENOSCOPY (EGD);  Surgeon: Danie Binder, MD;  [facility];  Service: Endoscopy;  Laterality: N/A;  . HALLUX VALGUS CORRECTION     bilateral foot surgery for bone repairs-multiple  . VAGINA RECONSTRUCTION SURGERY     tvt 2006- [place]    There were no vitals filed for this visit.      Subjective Assessment - 06/20/16 0844    Subjective Doing really good.     Currently in Pain? Yes   Pain Score 1    Pain Location Back   Pain Orientation Right   Pain Descriptors / Indicators Aching;Throbbing   Pain Type Chronic pain   Pain Onset More than a month ago   Pain Frequency Intermittent   Aggravating Factors  sitting, standing   Pain Relieving  Factors moving, walking                         [facility] Adult PT Treatment/Exercise - 06/20/16 0001      Lumbar Exercises: Stretches   Active Hamstring Stretch 3 reps;20 seconds   Single Knee to Chest Stretch 3 reps;20 seconds   Single Knee to Chest Stretch Limitations  knee to opposite shoulder 3x20 sec   Piriformis Stretch 3 reps;20 seconds  seated figure 4     Lumbar Exercises: Aerobic   Stationary Bike Nustep L2 x 10 min     Lumbar Exercises: Supine   Ab Set 10 reps  pelvic tilt     Knee/Hip Exercises: Sidelying   Clams 2 x15 red      Manual Therapy   Manual Therapy Soft tissue mobilization;Myofascial release   Manual therapy comments soft tissue elongation and trigger point release to Rt gluteal with trigger point release in Lt sidelying;          Trigger Point Dry Needling - 06/20/16 0851    Consent Given? Yes   Muscles Treated Lower Body Gluteus minimus;Gluteus maximus;Piriformis  Rt only   Gluteus Maximus Response Palpable increased muscle length   Gluteus Minimus Response Palpable increased muscle length   Piriformis Response Twitch response elicited;Palpable increased muscle length  PT Short Term Goals - 06/12/16 1303      PT SHORT TERM GOAL #1   Title independent with initial HEP   Status Achieved           PT Long Term Goals - 06/18/16 0831      PT LONG TERM GOAL #1   Title independent with advanced HEP   Time 8   Period Weeks   Status On-going     PT LONG TERM GOAL #2   Title pain reduced to 0/10 throughout work day   Time 8   Period Weeks   Status Partially Met  Depends on how much sitting I do     PT LONG TERM GOAL #3   Title increased hip IR/ER to WNL for improved functional movement and decreased pain   Time 8   Period Weeks   Status On-going               Plan - 06/20/16 0845    Clinical Impression Statement Pt reports 60% overall improvement since the start of care.  Pt with  tension and trigger points in Rt gluteals and demonstrated improved mobiltiy and reduced tension after dry needling today.  Pt with good technique and compliance with gluteal and abdominal strengthening.  Pt will continue to benefit from skilled PT for trigger point release to gluteals, strength and flexiblity exercises.     Rehab Potential Good   PT Frequency 2x / week   PT Duration 8 weeks   PT Treatment/Interventions Moist Heat;Therapeutic exercise;Therapeutic activities;Functional mobility training;Stair training;Gait training;Neuromuscular re-education;Patient/family education;Manual techniques;Passive range of motion;Dry needling   PT Next Visit Plan Assess response to dry needling, progress gluteal strengthening ex's;  flexibility for hips, manual if needed.   Consulted and Agree with Plan of Care Patient      Patient will benefit from skilled therapeutic intervention in order to improve the following deficits and impairments:  Pain, Postural dysfunction, Decreased mobility, Hypomobility, Decreased range of motion, Impaired flexibility  Visit Diagnosis: Chronic right-sided low back pain with sciatica, sciatica laterality unspecified  Stiffness of right hip, not elsewhere classified  Other muscle spasm  Stiffness of left hip, not elsewhere classified     Problem List Patient Active Problem List   Diagnosis Date Noted  . Diabetes ([place]) 06/16/2016  . HTN (hypertension) 05/31/2016  . Change in bowel habits 03/03/2014  . Depression 10/13/2013  . Fractured lateral malleolus 08/12/2012  . Left knee sprain 08/12/2012     Sigurd Sos, PT 06/20/16 9:26 AM  [facility] [address] [place], [place], [address] Phone: [phone]   Fax:  [phone]  Name: [name] MRN: [id number] Date of Birth: 02/08/1955

## 2016-07-02 ENCOUNTER — Ambulatory Visit: Payer: Managed Care, Other (non HMO) | Attending: Family Medicine

## 2016-07-02 DIAGNOSIS — M25651 Stiffness of right hip, not elsewhere classified: Secondary | ICD-10-CM

## 2016-07-02 DIAGNOSIS — G8929 Other chronic pain: Secondary | ICD-10-CM | POA: Diagnosis present

## 2016-07-02 DIAGNOSIS — M62838 Other muscle spasm: Secondary | ICD-10-CM | POA: Diagnosis present

## 2016-07-02 DIAGNOSIS — M25652 Stiffness of left hip, not elsewhere classified: Secondary | ICD-10-CM

## 2016-07-02 DIAGNOSIS — M5440 Lumbago with sciatica, unspecified side: Secondary | ICD-10-CM | POA: Diagnosis present

## 2016-07-02 NOTE — Patient Instructions (Addendum)
    ABDUCTION: Standing (Active)   Stand, feet flat. Lift right leg out to side. Use _0__ lbs. Complete __2x10_ repetitions. Perform __2_ sessions per day.     EXTENSION: Standing (Active)  Stand, both feet flat. Draw right leg behind body as far as possible. Use 0___ lbs. Complete 2x 10 repetitions. Perform __2_ sessions per day.  Copyright  VHI. All rights reserved.   [place] [address], [place], [place] [address] Phone # [phone] Fax [phone]

## 2016-07-02 NOTE — Therapy (Signed)
[facility] [address] [place], [place], [address] Phone: [phone]   Fax:  [phone]  Physical Therapy Treatment  Patient Details  Name: [name] MRN: [id number] Date of Birth: 02/08/1955 Referring Provider: Lujean Amel, MD  Encounter Date: 07/02/2016      PT End of Session - 07/02/16 0920    Visit Number 7   Date for PT Re-Evaluation 07/26/16   PT Start Time 0845   PT Stop Time 0923   PT Time Calculation (min) 38 min   Activity Tolerance Patient tolerated treatment well      Past Medical History:  Diagnosis Date  . Diabetes mellitus   . Thyroid disease     Past Surgical History:  Procedure Laterality Date  . ABDOMINAL HYSTERECTOMY    . ABDOMINAL SURGERY    . COLONOSCOPY  2005 MAC   TICs, IH  . COLONOSCOPY N/A 03/08/2014   Procedure: COLONOSCOPY;  Surgeon: Danie Binder, MD;  [facility];  Service: Endoscopy;  Laterality: N/A;  1015  . CYSTOSTOMY  11/22/2011   Procedure: CYSTOSTOMY SUPRAPUBIC;  Surgeon: Marissa Nestle, MD;  Location: [facility];  Service: Urology;  Laterality: N/A;  . ESOPHAGOGASTRODUODENOSCOPY N/A 03/08/2014   Procedure: ESOPHAGOGASTRODUODENOSCOPY (EGD);  Surgeon: Danie Binder, MD;  [facility];  Service: Endoscopy;  Laterality: N/A;  . HALLUX VALGUS CORRECTION     bilateral foot surgery for bone repairs-multiple  . VAGINA RECONSTRUCTION SURGERY     tvt 2006- [place]    There were no vitals filed for this visit.      Subjective Assessment - 07/02/16 0848    Subjective 75% overall improvement in symptoms since the start of care.     Currently in Pain? Yes   Pain Score 0-No pain   Pain Location Back   Pain Orientation Right   Pain Descriptors / Indicators Aching;Throbbing   Pain Type Chronic pain   Pain Onset More than a month ago                         [facility] Adult PT Treatment/Exercise - 07/02/16 0001      Lumbar Exercises: Stretches   Active Hamstring Stretch 3 reps;20 seconds   Piriformis Stretch 3 reps;20 seconds  seated figure 4     Lumbar Exercises: Aerobic   Stationary Bike Nustep L2 x 10 min     Lumbar Exercises: Supine   Ab Set 10 reps  pelvic tilt     Knee/Hip Exercises: Standing   Hip Abduction Stengthening;Both;2 sets;10 reps   Hip Extension Stengthening;Both;2 sets;10 reps     Knee/Hip Exercises: Sidelying   Clams 2 x15 red      Manual Therapy   Manual Therapy --                PT Education - 07/02/16 0905    Education provided Yes   Education Details standing hip extension and abduction   Person(s) Educated Patient   Methods Explanation;Demonstration;Handout   Comprehension Verbalized understanding;Returned demonstration          PT Short Term Goals - 06/12/16 1303      PT SHORT TERM GOAL #1   Title independent with initial HEP   Status Achieved           PT Long Term Goals - 07/02/16 1517      PT LONG TERM GOAL #1   Title independent with  advanced HEP   Time 8   Period Weeks   Status On-going     PT LONG TERM GOAL #2   Title pain reduced to 0/10 throughout work day   Status Achieved     PT LONG TERM GOAL #3   Title increased hip IR/ER to WNL for improved functional movement and decreased pain   Time 8   Period Weeks   Status On-going               Plan - 07/02/16 5520    Clinical Impression Statement Pt reports 75% overall improvement in symptoms since the start of care.  Pt without pain today.  Pt is consistent and independent in HEP for flexiblity and strength.  Pt will attend 1 more session for advancement of HEP.  Likely put on hold next session until 07/18/16.     Rehab Potential Good   PT Frequency 2x / week   PT Duration 8 weeks   PT Treatment/Interventions Moist Heat;Therapeutic exercise;Therapeutic activities;Functional mobility training;Stair training;Gait training;Neuromuscular re-education;Patient/family  education;Manual techniques;Passive range of motion;Dry needling   PT Next Visit Plan advance HEP next session, give FOTO, measure hip IR/ER for goal assessment.  Place on hold until 07/18/16 after next session.     Consulted and Agree with Plan of Care Patient      Patient will benefit from skilled therapeutic intervention in order to improve the following deficits and impairments:  Pain, Postural dysfunction, Decreased mobility, Hypomobility, Decreased range of motion, Impaired flexibility  Visit Diagnosis: Chronic right-sided low back pain with sciatica, sciatica laterality unspecified  Stiffness of right hip, not elsewhere classified  Other muscle spasm  Stiffness of left hip, not elsewhere classified     Problem List Patient Active Problem List   Diagnosis Date Noted  . Diabetes ([place]) 06/16/2016  . HTN (hypertension) 05/31/2016  . Change in bowel habits 03/03/2014  . Depression 10/13/2013  . Fractured lateral malleolus 08/12/2012  . Left knee sprain 08/12/2012     Sigurd Sos, PT 07/02/16 9:23 AM  [facility] [address], [place], [place], [address] Phone: [phone]   Fax:  [phone]  Name: [name] MRN: [id number] Date of Birth: 02/08/1955

## 2016-07-04 ENCOUNTER — Ambulatory Visit: Payer: Managed Care, Other (non HMO) | Admitting: Physical Therapy

## 2016-07-04 ENCOUNTER — Encounter: Payer: Self-pay | Admitting: Physical Therapy

## 2016-07-04 DIAGNOSIS — M25651 Stiffness of right hip, not elsewhere classified: Secondary | ICD-10-CM

## 2016-07-04 DIAGNOSIS — M5440 Lumbago with sciatica, unspecified side: Secondary | ICD-10-CM | POA: Diagnosis not present

## 2016-07-04 DIAGNOSIS — G8929 Other chronic pain: Secondary | ICD-10-CM

## 2016-07-04 NOTE — Therapy (Addendum)
[facility] [address], [place], [place], [address] Phone: [phone]   Fax:  [phone]  Physical Therapy Treatment  Patient Details  Name: [name] MRN: [id number] Date of Birth: 02/08/1955 Referring Provider: Lujean Amel, MD  Encounter Date: 07/04/2016      PT End of Session - 07/04/16 0854    Visit Number 8   Date for PT Re-Evaluation 07/26/16   PT Start Time 0840   PT Stop Time 0915   PT Time Calculation (min) 35 min   Activity Tolerance Patient tolerated treatment well   Behavior During Therapy [facility] for tasks assessed/performed      Past Medical History:  Diagnosis Date  . Diabetes mellitus   . Thyroid disease     Past Surgical History:  Procedure Laterality Date  . ABDOMINAL HYSTERECTOMY    . ABDOMINAL SURGERY    . COLONOSCOPY  2005 MAC   TICs, IH  . COLONOSCOPY N/A 03/08/2014   Procedure: COLONOSCOPY;  Surgeon: Danie Binder, MD;  [facility];  Service: Endoscopy;  Laterality: N/A;  1015  . CYSTOSTOMY  11/22/2011   Procedure: CYSTOSTOMY SUPRAPUBIC;  Surgeon: Marissa Nestle, MD;  Location: [facility];  Service: Urology;  Laterality: N/A;  . ESOPHAGOGASTRODUODENOSCOPY N/A 03/08/2014   Procedure: ESOPHAGOGASTRODUODENOSCOPY (EGD);  Surgeon: Danie Binder, MD;  [facility];  Service: Endoscopy;  Laterality: N/A;  . HALLUX VALGUS CORRECTION     bilateral foot surgery for bone repairs-multiple  . VAGINA RECONSTRUCTION SURGERY     tvt 2006- [place]    There were no vitals filed for this visit.      Subjective Assessment - 07/04/16 0855    Subjective Today is potentially my last day, but I feel closer to 80% better today.    Currently in Pain? No/denies   Multiple Pain Sites No            [facility] PT Assessment - 07/04/16 0001      Observation/Other Assessments   Focus on Therapeutic Outcomes (FOTO)  6% limited     AROM   Overall AROM Comments  Sidebend Bil WNL     PROM   Overall PROM Comments Bil IR/ER 25% limited                     [facility] Adult PT Treatment/Exercise - 07/04/16 0001      Lumbar Exercises: Aerobic   Stationary Bike Nustep L2 x 10 min     Lumbar Exercises: Quadruped   [place] 5 reps     Knee/Hip Exercises: Stretches   Active Hamstring Stretch Both;3 reps;20 seconds  Seated and supine   Piriformis Stretch Right;1 rep;20 seconds                PT Education - 07/04/16 0913    Education provided Yes   Education Details Quadruped cat/camel   Person(s) Educated Patient   Methods Explanation;Demonstration;Tactile cues;Verbal cues   Comprehension Returned demonstration          PT Short Term Goals - 06/12/16 1303      PT SHORT TERM GOAL #1   Title independent with initial HEP   Status Achieved           PT Long Term Goals - 07/04/16 0856      PT LONG TERM GOAL #1   Title independent with advanced HEP   Time 8   Period Weeks   Status  Achieved     PT LONG TERM GOAL #2   Title pain reduced to 0/10 throughout work day   Time 8   Status Achieved     PT LONG TERM GOAL #3   Title increased hip IR/ER to WNL for improved functional movement and decreased pain   Time 8   Period Weeks   Status Achieved               Plan - 07/04/16 0916    Clinical Impression Statement Pt feels she is anywhere from 80%-90% improved since the eval.  She wants to be put on hold until she sees the MD after the first of the year. She is independent in her final HEP and signifiicantly reduced her FOTO score. Pt's hip and spine AROM has also improved since eval.    Rehab Potential Good   PT Frequency 2x / week   PT Duration 8 weeks   PT Treatment/Interventions Moist Heat;Therapeutic exercise;Therapeutic activities;Functional mobility training;Stair training;Gait training;Neuromuscular re-education;Patient/family education;Manual techniques;Passive range of motion;Dry needling    PT Next Visit Plan Pt on hold at this time.    Consulted and Agree with Plan of Care Patient      Patient will benefit from skilled therapeutic intervention in order to improve the following deficits and impairments:  Pain, Postural dysfunction, Decreased mobility, Hypomobility, Decreased range of motion, Impaired flexibility  Visit Diagnosis: Chronic right-sided low back pain with sciatica, sciatica laterality unspecified  Stiffness of right hip, not elsewhere classified     Problem List Patient Active Problem List   Diagnosis Date Noted  . Diabetes ([place]) 06/16/2016  . HTN (hypertension) 05/31/2016  . Change in bowel habits 03/03/2014  . Depression 10/13/2013  . Fractured lateral malleolus 08/12/2012  . Left knee sprain 08/12/2012    Jarrad Mclees, PTA 07/04/2016, 9:25 AM PHYSICAL THERAPY DISCHARGE SUMMARY  Visits from Start of Care: 8  Current functional level related to goals / functional outcomes: See above for current status.  All goals met.     Remaining deficits: See above for most current status.  Pt has HEP for continued gains.   Education / Equipment: HEP, posture/body mechanics  Plan: Patient agrees to discharge.  Patient goals were met. Patient is being discharged due to meeting the stated rehab goals.  ?????        Sigurd Sos, PT 07/25/16 10:59 AM   [facility] [address], [place], [place], [address] Phone: [phone]   Fax:  [phone]  Name: [name] MRN: [id number] Date of Birth: 02/08/1955

## 2016-07-11 ENCOUNTER — Ambulatory Visit (INDEPENDENT_AMBULATORY_CARE_PROVIDER_SITE_OTHER): Payer: Managed Care, Other (non HMO) | Admitting: Gastroenterology

## 2016-07-11 ENCOUNTER — Encounter: Payer: Self-pay | Admitting: Gastroenterology

## 2016-07-11 DIAGNOSIS — R194 Change in bowel habit: Secondary | ICD-10-CM

## 2016-07-11 NOTE — Progress Notes (Signed)
CC'ED TO PCP 

## 2016-07-11 NOTE — Progress Notes (Signed)
Subjective:    Patient ID: [name], female    DOB: 02/08/1955, 60 y.o.   MRN: [id number]  Lujean Amel, MD  HPI Bowels been running off since beginning of NOV. HAPPENED ALL OF A SUDDEN. HAVING AT LEAST 7 X/DAY(SOFT , LOOSE, WATERY). RARE NOCTURNAL STOOLS. MILK: RARE. ICE CREAM: NEVER. CHEESE: EVERY DAY. APPETITE: GOOD. WEIGHT: 222 LBS 2015 AND 249 LBS IN 2017. LAST TSH: ~ 1 YEAR AGO. FLU AND PNA SHOT UP TO DATE. NO SICK CONTACTS. STRESS: LESS. ON LEXAPRO FOR A COUPLE OF YEARS. METFORMIN: 3 YEARS UP IT TO 1000 MG BID 3 WEEKS AGO-NO CHANGE IN DIARRHEA. PROTONIX FOR 5 YEARS. TRAVEL: NO WELL WATER: YES, FILTER SINCE THIS SUMMER, LIVED THERE 5 YRS. ABX: NO. NH/HOSPITAL VISITS: NO. COMMUNITY BATHROOM: YES.   PT DENIES FEVER, CHILLS, HEMATOCHEZIA, HEMATEMESIS, nausea, vomiting, melena, CHEST PAIN, SHORTNESS OF BREATH,  CHANGE IN BOWEL IN HABITS, constipation, abdominal pain, problems swallowing, OR heartburn or indigestion.  Past Medical History:  Diagnosis Date  . Diabetes mellitus   . Thyroid disease    Past Surgical History:  Procedure Laterality Date  . ABDOMINAL HYSTERECTOMY    . ABDOMINAL SURGERY    . COLONOSCOPY  2005 MAC   TICs, IH  . COLONOSCOPY N/A 03/08/2014   Procedure: COLONOSCOPY;  Surgeon: Danie Binder, MD;  [facility];  Service: Endoscopy;  Laterality: N/A;  1015  . CYSTOSTOMY  11/22/2011   Procedure: CYSTOSTOMY SUPRAPUBIC;  Surgeon: Marissa Nestle, MD;  Location: [facility];  Service: Urology;  Laterality: N/A;  . ESOPHAGOGASTRODUODENOSCOPY N/A 03/08/2014   Procedure: ESOPHAGOGASTRODUODENOSCOPY (EGD);  Surgeon: Danie Binder, MD;  [facility];  Service: Endoscopy;  Laterality: N/A;  . HALLUX VALGUS CORRECTION     bilateral foot surgery for bone repairs-multiple  . VAGINA RECONSTRUCTION SURGERY     tvt 2006- [place]   Allergies  Allergen Reactions  . Synthroid [Levothyroxine Sodium] Anaphylaxis  . Iodine Hives  .  Povidone-Iodine Itching and Rash   Current Outpatient Prescriptions on File Prior to Visit  Medication Sig Dispense Refill  . amLODipine (NORVASC) 5 MG tablet Take 5 mg by mouth daily.    Marland Kitchen escitalopram (LEXAPRO) 10 MG tablet Take 1 tablet (10 mg total) by mouth every morning.    Marland Kitchen glucose blood (ONETOUCH VERIO) test strip 1 each by Other route 2 (two) times daily. And lancets 2/day    . insulin lispro (HUMALOG KWIKPEN) 100 UNIT/ML KiwkPen 3 times a day (just before each meal) 07-04-09 units, and pen needles 4/day    . lisinopril (PRINIVIL,ZESTRIL) 5 MG tablet Take 40 mg by mouth every evening.     . metFORMIN (GLUCOPHAGE) 500 MG tablet Take 1,000 mg by mouth 2 (two) times daily.     . pantoprazole (PROTONIX) 40 MG tablet Take 40 mg by mouth daily.    . traZODone (DESYREL) 100 MG tablet Take 2 tablets (200 mg total) by mouth at bedtime.    . Insulin Detemir (LEVEMIR FLEXTOUCH) 100 UNIT/ML Pen Inject 4 Units into the skin at bedtime. (Patient not taking: Reported on 07/11/2016)     Review of Systems PER HPI OTHERWISE ALL SYSTEMS ARE NEGATIVE.    Objective:   Physical Exam  Constitutional: She is oriented to person, place, and time. She appears well-developed and well-nourished. No distress.  HENT:  Head: Normocephalic and atraumatic.  Mouth/Throat: Oropharynx is clear and moist. No oropharyngeal exudate.  Eyes: Pupils are equal, round, and reactive to  light. No scleral icterus.  Neck: Normal range of motion. Neck supple.  Cardiovascular: Normal rate, regular rhythm and normal heart sounds.   Pulmonary/Chest: Effort normal and breath sounds normal. No respiratory distress.  Abdominal: Soft. Bowel sounds are normal. She exhibits no distension. There is no tenderness.  Musculoskeletal: She exhibits no edema.  Lymphadenopathy:    She has no cervical adenopathy.  Neurological: She is alert and oriented to person, place, and time.  NO FOCAL DEFICITS  Psychiatric: She has a normal mood and  affect.  Vitals reviewed.     Assessment & Plan:

## 2016-07-11 NOTE — Patient Instructions (Addendum)
DRINK WATER TO KEEP YOUR URINE LIGHT YELLOW.  TAKE 3 LACTASE PILLS WITH MEALS UP TO THREE TIMES A DAY.  Avoid lactose. See INFO BELOW.  USE IMODIUM 30 MINS BEFORE BREAKFAST AND LUNCH.  SUBMIT BLOOD SAMPLE AND STOOL STUDIES.  PLEASE CALL ME DEC 21 IF YOUR STOOLS ARE NOT IMPROVED.  FOLLOW UP IN 2 MOS.    Lactose Free Diet Lactose is a carbohydrate that is found mainly in milk and milk products, as well as in foods with added milk or whey. Lactose must be digested by the enzyme in order to be used by the body. Lactose intolerance occurs when there is a shortage of lactase. When your body is not able to digest lactose, you may feel sick to your stomach (nausea), bloating, cramping, gas and diarrhea.  There are many dairy products that may be tolerated better than milk by some people:  The use of cultured dairy products such as yogurt, buttermilk, cottage cheese, and sweet acidophilus milk (Kefir) for lactase-deficient individuals is usually well tolerated. This is because the healthy bacteria help digest lactose.   Lactose-hydrolyzed milk (Lactaid) contains 40-90% less lactose than milk and may also be well tolerated.    SPECIAL NOTES  Lactose is a carbohydrates. The major food source is dairy products. Reading food labels is important. Many products contain lactose even when they are not made from milk. Look for the following words: whey, milk solids, dry milk solids, nonfat dry milk powder. Typical sources of lactose other than dairy products include breads, candies, cold cuts, prepared and processed foods, and commercial sauces and gravies.   All foods must be prepared without milk, cream, or other dairy foods.   Soy milk and lactose-free supplements (LACTASE) may be used as an alternative to milk.   FOOD GROUP ALLOWED/RECOMMENDED AVOID/USE SPARINGLY  BREADS / STARCHES 4 servings or more* Breads and rolls made without milk. [place], [place], or [place] bread. Breads and rolls that  contain milk. Prepared mixes such as muffins, biscuits, waffles, pancakes. Sweet rolls, donuts, [place] toast (if made with milk or lactose).  Crackers: Soda crackers, graham crackers. Any crackers prepared without lactose. Zwieback crackers, corn curls, or any that contain lactose.  Cereals: Cooked or dry cereals prepared without lactose (read labels). Cooked or dry cereals prepared with lactose (read labels). Total, Cocoa Krispies. Special K.  Potatoes / Pasta / Rice: Any prepared without milk or lactose. Popcorn. Instant potatoes, frozen [place] fries, scalloped or au gratin potatoes.  VEGETABLES 2 servings or more Fresh, frozen, and canned vegetables. Creamed or breaded vegetables. Vegetables in a cheese sauce or with lactose-containing margarines.  FRUIT 2 servings or more All fresh, canned, or frozen fruits that are not processed with lactose. Any canned or frozen fruits processed with lactose.  MEAT & SUBSTITUTES 2 servings or more (4 to 6 oz. total per day) Plain beef, chicken, fish, [place], lamb, veal, pork, or ham. Kosher prepared meat products. Strained or junior meats that do not contain milk. Eggs, soy meat substitutes, nuts. Scrambled eggs, omelets, and souffles that contain milk. Creamed or breaded meat, fish, or fowl. Sausage products such as wieners, liver sausage, or cold cuts that contain milk solids. Cheese, cottage cheese, or cheese spreads.  MILK None. (See "BEVERAGES" for milk substitutes. See "DESSERTS" for ice cream and frozen desserts.) Milk (whole, 2%, skim, or chocolate). Evaporated, powdered, or condensed milk; malted milk.  SOUPS & COMBINATION FOODS Bouillon, broth, vegetable soups, clear soups, consomms. Homemade soups made with allowed ingredients.  Combination or prepared foods that do not contain milk or milk products (read labels). Cream soups, chowders, commercially prepared soups containing lactose. Macaroni and cheese, pizza. Combination or prepared foods that  contain milk or milk products.  DESSERTS & SWEETS In moderation Water and fruit ices; gelatin; angel food cake. Homemade cookies, pies, or cakes made from allowed ingredients. Pudding (if made with water or a milk substitute). Lactose-free tofu desserts. Sugar, honey, corn syrup, jam, jelly; marmalade; molasses (beet sugar); Pure sugar candy; marshmallows. Ice cream, ice milk, sherbet, custard, pudding, frozen yogurt. Commercial cake and cookie mixes. Desserts that contain chocolate. Pie crust made with milk-containing margarine; reduced-calorie desserts made with a sugar substitute that contains lactose. Toffee, peppermint, butterscotch, chocolate, caramels.  FATS & OILS In moderation Butter (as tolerated; contains very small amounts of lactose). Margarines and dressings that do not contain milk, Vegetable oils, shortening, Miracle Whip, mayonnaise, nondairy cream & whipped toppings without lactose or milk solids added (examples: Coffee Rich, Carnation Coffeemate, Rich's Whipped Topping, PolyRich). Berniece Salines. Margarines and salad dressings containing milk; cream, cream cheese; peanut butter with added milk solids, sour cream, chip dips, made with sour cream.  BEVERAGES Carbonated drinks; tea; coffee and freeze-dried coffee; some instant coffees (check labels). Fruit drinks; fruit and vegetable juice; Rice or Soy milk. Ovaltine, hot chocolate. Some cocoas; some instant coffees; instant iced teas; powdered fruit drinks (read labels).   CONDIMENTS / MISCELLANEOUS Soy sauce, carob powder, olives, gravy made with water, baker's cocoa, pickles, pure seasonings and spices, wine, pure monosodium glutamate, catsup, mustard. Some chewing gums, chocolate, some cocoas. Certain antibiotics and vitamin / mineral preparations. Spice blends if they contain milk products. MSG extender. Artificial sweeteners that contain lactose such as Equal (Nutra-Sweet) and Sweet 'n Low. Some nondairy creamers (read labels).   SAMPLE  MENU*  Breakfast   Orange Juice.  Banana.   Bran flakes.   Nondairy Creamer.  [place] Bread (toasted).   Butter or milk-free margarine.   Coffee or tea.    Noon Meal   Chicken Breast.  Rice.   Green beans.   Butter or milk-free margarine.  Fresh melon.   Coffee or tea.    Evening Meal   Roast Beef.  Baked potato.   Butter or milk-free margarine.   Broccoli.   Lettuce salad with vinegar and oil dressing.  [employer]   Coffee or tea.

## 2016-07-11 NOTE — Assessment & Plan Note (Addendum)
SYMPTOMS NOT CONTROLLED. DIFFERENTIAL DIAGNOSIS INCLUDES: LACTOSE INTOLERANCE, GIARDIASIS, C DIFF COLITIS, SMALL INTESTINE BACTERIAL OVERGROWTH, MEDS (METFORMIN, LEXAPRO, OR PROTONIX), THYROID DISTURBANCE, LESS LIKELY  MICROSCOPIC COLITIS, OR IBD. LAST TCS 2015 REVIEWED: NL COLON AND DUODENAL Bx.  DRINK WATER TO KEEP YOUR URINE LIGHT YELLOW. TAKE 3 LACTASE PILLS WITH MEALS UP TO THREE TIMES A DAY. Avoid lactose.  HANDOUT GIVEN. USE IMODIUM 30 MINS BEFORE BREAKFAST AND LUNCH. SUBMIT BLOOD SAMPLE AND STOOL STUDIES. CALL ON DEC 21 IF  STOOLS ARE NOT IMPROVED THEN WILL NEED 24 HR STOOL COLLECTION OF OSMOLALITY AND NA/K.  FOLLOW UP IN 2 MOS.

## 2016-07-12 LAB — TSH: TSH: 2.93 mIU/L

## 2016-07-12 NOTE — Progress Notes (Signed)
On recall  °

## 2016-07-12 NOTE — Progress Notes (Signed)
cc'ed to pcp °

## 2016-07-13 LAB — TISSUE TRANSGLUTAMINASE, IGA: Tissue Transglutaminase Ab, IgA: 1 U/mL (ref <4)

## 2016-07-25 ENCOUNTER — Encounter: Payer: Self-pay | Admitting: Gastroenterology

## 2016-08-13 NOTE — Progress Notes (Signed)
Subjective:    Patient ID: [name], female    DOB: 02/08/1955, 61 y.o.   MRN: [id number]  HPI Pt returns for f/u of diabetes mellitus: DM type: Insulin-requiring type 2 Dx'ed: 4650 Complications: none Therapy: insulin since soon after dx GDM: never (G0) DKA: never Severe hypoglycemia: never Pancreatitis: never Other: she takes multiple daily injections; she stopped victoza, due to lack of effect.   Interval history: no cbg record, but states it varies from 89-200's.  She says it is still higher fasting than at HS, despite not eating at HS.  It is lowest with exertion.  She says she misses approx 4-5 doses per week.  She did not start levemir.   Past Medical History:  Diagnosis Date  . Diabetes mellitus   . Thyroid disease     Past Surgical History:  Procedure Laterality Date  . ABDOMINAL HYSTERECTOMY    . ABDOMINAL SURGERY    . COLONOSCOPY  2005 MAC   TICs, IH  . COLONOSCOPY N/A 03/08/2014   Procedure: COLONOSCOPY;  Surgeon: Danie Binder, MD;  Location: [facility];  Service: Endoscopy;  Laterality: N/A;  1015  . CYSTOSTOMY  11/22/2011   Procedure: CYSTOSTOMY SUPRAPUBIC;  Surgeon: Marissa Nestle, MD;  Location: [facility];  Service: Urology;  Laterality: N/A;  . ESOPHAGOGASTRODUODENOSCOPY N/A 03/08/2014   Procedure: ESOPHAGOGASTRODUODENOSCOPY (EGD);  Surgeon: Danie Binder, MD;  Location: [facility];  Service: Endoscopy;  Laterality: N/A;  . HALLUX VALGUS CORRECTION     bilateral foot surgery for bone repairs-multiple  . VAGINA RECONSTRUCTION SURGERY     tvt 2006- [place] History   Social History  . Marital status: Single    Spouse name: N/A  . Number of children: N/A  . Years of education: N/A   Occupational History  . Not on file.   Social History Main Topics  . Smoking status: Former Smoker    Packs/day: 1.00    Years: 20.00    Types: Cigarettes    Quit date: 11/19/2001  . Smokeless tobacco: Former Systems developer    Quit date:  10/17/2011  . Alcohol use No     Comment: rarely  . Drug use: No  . Sexual activity: Yes    Birth control/ protection: Surgical   Other Topics Concern  . Not on file   Social History Narrative  . No narrative on file    Current Outpatient Prescriptions on File Prior to Visit  Medication Sig Dispense Refill  . amLODipine (NORVASC) 5 MG tablet Take 5 mg by mouth daily.    Marland Kitchen escitalopram (LEXAPRO) 10 MG tablet Take 1 tablet (10 mg total) by mouth every morning. 30 tablet 5  . glucose blood (ONETOUCH VERIO) test strip 1 each by Other route 2 (two) times daily. And lancets 2/day 100 each 12  . Insulin Detemir (LEVEMIR FLEXTOUCH) 100 UNIT/ML Pen Inject 4 Units into the skin at bedtime. 15 mL 11  . insulin lispro (HUMALOG KWIKPEN) 100 UNIT/ML KiwkPen 3 times a day (just before each meal) 07-04-09 units, and pen needles 4/day 15 mL 11  . lisinopril (PRINIVIL,ZESTRIL) 5 MG tablet Take 40 mg by mouth every evening.     . metFORMIN (GLUCOPHAGE) 500 MG tablet Take 1,000 mg by mouth 2 (two) times daily.     . pantoprazole (PROTONIX) 40 MG tablet Take 40 mg by mouth daily.    . traZODone (DESYREL) 100 MG tablet Take 2 tablets (200 mg total)  by mouth at bedtime. 60 tablet 5   No current facility-administered medications on file prior to visit.     Allergies  Allergen Reactions  . Synthroid [Levothyroxine Sodium] Anaphylaxis  . Iodine Hives  . Povidone-Iodine Itching and Rash    Family History  Problem Relation Age of Onset  . Asthma    . Diabetes    . Arthritis    . Anesthesia problems Neg Hx   . Hypotension Neg Hx   . Malignant hyperthermia Neg Hx   . Pseudochol deficiency Neg Hx   . Colon polyps Neg Hx   . Colon cancer Neg Hx   . Celiac disease Neg Hx   . Pancreatic cancer Neg Hx   . Stomach cancer Neg Hx   . Ulcerative colitis Neg Hx   . Crohn's disease Neg Hx     BP (!) 142/86   Pulse 92   Ht 5' 6"  (1.676 m)   Wt 248 lb (112.5 kg)   SpO2 95%   BMI 40.03 kg/m     Review of Systems She denies hypoglycemia.     Objective:   Physical Exam VITAL SIGNS:  See vs page GENERAL: no distress Pulses: dorsalis pedis intact bilat.   MSK: no deformity of the feet CV: trace bilat leg edema Skin:  no ulcer on the feet.  normal color and temp on the feet. Neuro: sensation is intact to touch on the feet  A1c=8.4%     Assessment & Plan:  Insulin-requiring type 2 DM: Based on the pattern of her cbg's, she needs HS long-acting insulin.  She may do better with  V-GO device.   Patient is advised the following: Patient Instructions  Please continue the same insulin for now. I have sent a prescription to your pharmacy, for the "V-GO."  If you use this, you will need to get a humalog prescription in a bottle, which I'll send for you check your blood sugar twice a day.  vary the time of day when you check, between before the 3 meals, and at bedtime.  also check if you have symptoms of your blood sugar being too high or too low.  please keep a record of the readings and bring it to your next appointment here (or you can bring the meter itself).  You can write it on any piece of paper.  please call us sooner if your blood sugar goes below 70, or if you have a lot of readings over 200.   Please come back for a follow-up appointment in 1-2 weeks.  If you decide no to do the V-GO, please start taking the levemir at night.

## 2016-08-14 ENCOUNTER — Ambulatory Visit (INDEPENDENT_AMBULATORY_CARE_PROVIDER_SITE_OTHER): Payer: Managed Care, Other (non HMO) | Admitting: Endocrinology

## 2016-08-14 ENCOUNTER — Encounter: Payer: Self-pay | Admitting: Endocrinology

## 2016-08-14 VITALS — BP 142/86 | HR 92 | Ht 66.0 in | Wt 248.0 lb

## 2016-08-14 DIAGNOSIS — Z794 Long term (current) use of insulin: Secondary | ICD-10-CM

## 2016-08-14 DIAGNOSIS — E119 Type 2 diabetes mellitus without complications: Secondary | ICD-10-CM | POA: Diagnosis not present

## 2016-08-14 LAB — POCT GLYCOSYLATED HEMOGLOBIN (HGB A1C): Hemoglobin A1C: 8.4

## 2016-08-14 MED ORDER — V-GO 20 KIT: 1.0000 | PACK | Freq: Every day | 11 refills | Status: DC

## 2016-08-14 NOTE — Patient Instructions (Addendum)
Please continue the same insulin for now. I have sent a prescription to your pharmacy, for the "V-GO."  If you use this, you will need to get a humalog prescription in a bottle, which I'll send for you check your blood sugar twice a day.  vary the time of day when you check, between before the 3 meals, and at bedtime.  also check if you have symptoms of your blood sugar being too high or too low.  please keep a record of the readings and bring it to your next appointment here (or you can bring the meter itself).  You can write it on any piece of paper.  please call us sooner if your blood sugar goes below 70, or if you have a lot of readings over 200.   Please come back for a follow-up appointment in 1-2 weeks.  If you decide no to do the V-GO, please start taking the levemir at night.

## 2016-08-22 ENCOUNTER — Encounter: Payer: Managed Care, Other (non HMO) | Attending: Endocrinology | Admitting: Nutrition

## 2016-08-22 DIAGNOSIS — E119 Type 2 diabetes mellitus without complications: Secondary | ICD-10-CM | POA: Insufficient documentation

## 2016-08-22 DIAGNOSIS — Z794 Long term (current) use of insulin: Secondary | ICD-10-CM | POA: Insufficient documentation

## 2016-08-23 NOTE — Progress Notes (Signed)
Pt. Is here to start the V-go, but reports that she has heard nothing from [facility] concerning her cost.  They were called, and they reported that they did not receive any information from [place] about her.  Information was given over the phone, and they promised to call her in 1-2 days.   She was trained on how to fill/apply and use the V-go-20.  She filled it with Humalog insulin as directed, and applied it to her upper mid abdomen with out any difficulty.  She reports that she has not taken her long acting insulin.  She tested her blood sugar, and it was 268 fasting today.  She did one button press for the high readings. She reports taking 12u of Humlog acB, 6-10u acL and 10-16u acS.  She was told to take 2 button presses (4u ac meals, until she returns to see Dr. Loanne Drilling in 7 days.   She was given a starter kit of V-Go 20s with one extra one in it, since she is not returning for 7 days. The starter kit contains directions for use, and the 800 telephone number to call if questions. .   She was given a log sheet to record her blood sugars, and told to test ac and HS, and to bring the sheet with her on her return in 1 week.  She agreed to do this.  She was also told to call if blood sugars drop low, before 1 week return.   She had no final questions

## 2016-08-23 NOTE — Patient Instructions (Addendum)
Fill and appy a new V-Go with Humalog insulin every 24 hours. Stop Levemir insulin. Test blood sugar before each meal and at bedtime for one week. Bring readings to visit next week.

## 2016-08-28 ENCOUNTER — Telehealth: Payer: Self-pay | Admitting: Endocrinology

## 2016-08-28 ENCOUNTER — Encounter: Payer: Self-pay | Admitting: Endocrinology

## 2016-08-28 ENCOUNTER — Ambulatory Visit (INDEPENDENT_AMBULATORY_CARE_PROVIDER_SITE_OTHER): Payer: Managed Care, Other (non HMO) | Admitting: Endocrinology

## 2016-08-28 VITALS — BP 146/84 | HR 92 | Ht 66.0 in | Wt 247.0 lb

## 2016-08-28 DIAGNOSIS — Z794 Long term (current) use of insulin: Secondary | ICD-10-CM

## 2016-08-28 DIAGNOSIS — E119 Type 2 diabetes mellitus without complications: Secondary | ICD-10-CM | POA: Diagnosis not present

## 2016-08-28 MED ORDER — INSULIN LISPRO 100 UNIT/ML ~~LOC~~ SOLN: SUBCUTANEOUS | 11 refills | Status: DC

## 2016-08-28 MED ORDER — V-GO 20 KIT: 1.0000 | PACK | Freq: Every day | 11 refills | Status: DC

## 2016-08-28 NOTE — Patient Instructions (Addendum)
Please take 1 click with breakfast, 2 with lunch, and 5 with supper.  check your blood sugar twice a day.  vary the time of day when you check, between before the 3 meals, and at bedtime.  also check if you have symptoms of your blood sugar being too high or too low.  please keep a record of the readings and bring it to your next appointment here (or you can bring the meter itself).  You can write it on any piece of paper.  please call us sooner if your blood sugar goes below 70, or if you have a lot of readings over 200.   Please come back for a follow-up appointment in 2 weeks.

## 2016-08-28 NOTE — Progress Notes (Signed)
Subjective:    Patient ID: [name], female    DOB: 02/08/1955, 61 y.o.   MRN: [id number]  HPI Pt returns for f/u of diabetes mellitus: DM type: Insulin-requiring type 2 Dx'ed: 4128 Complications: none Therapy: insulin since soon after dx.  GDM: never (G0) DKA: never Severe hypoglycemia: never Pancreatitis: never Other: she takes multiple daily injections; she stopped victoza, due to lack of effect.   Interval history: She has been on V-GO-20 x 1 week.  She takes 1-4 clicks per meal.  she brings a record of her cbg's which i have reviewed today.  It varies from 126-203.  It is lowest at lunch, and highest at HS. pt states she feels well in general.   Past Medical History:  Diagnosis Date  . Diabetes mellitus   . Thyroid disease     Past Surgical History:  Procedure Laterality Date  . ABDOMINAL HYSTERECTOMY    . ABDOMINAL SURGERY    . COLONOSCOPY  2005 MAC   TICs, IH  . COLONOSCOPY N/A 03/08/2014   Procedure: COLONOSCOPY;  Surgeon: Danie Binder, MD;  [facility];  Service: Endoscopy;  Laterality: N/A;  1015  . CYSTOSTOMY  11/22/2011   Procedure: CYSTOSTOMY SUPRAPUBIC;  Surgeon: Marissa Nestle, MD;  Location: [facility];  Service: Urology;  Laterality: N/A;  . ESOPHAGOGASTRODUODENOSCOPY N/A 03/08/2014   Procedure: ESOPHAGOGASTRODUODENOSCOPY (EGD);  Surgeon: Danie Binder, MD;  [facility]: [facility];  Service: Endoscopy;  Laterality: N/A;  . HALLUX VALGUS CORRECTION     bilateral foot surgery for bone repairs-multiple  . VAGINA RECONSTRUCTION SURGERY     tvt 2006- [place] History   Social History  . Marital status: Single    Spouse name: N/A  . Number of children: N/A  . Years of education: N/A   Occupational History  . Not on file.   Social History Main Topics  . Smoking status: Former Smoker    Packs/day: 1.00    Years: 20.00    Types: Cigarettes    Quit date: 11/19/2001  . Smokeless tobacco: Former Systems developer    Quit date:  10/17/2011  . Alcohol use No     Comment: rarely  . Drug use: No  . Sexual activity: Yes    Birth control/ protection: Surgical   Other Topics Concern  . Not on file   Social History Narrative  . No narrative on file    Current Outpatient Prescriptions on File Prior to Visit  Medication Sig Dispense Refill  . amLODipine (NORVASC) 5 MG tablet Take 5 mg by mouth daily.    Marland Kitchen atorvastatin (LIPITOR) 10 MG tablet     . escitalopram (LEXAPRO) 10 MG tablet Take 1 tablet (10 mg total) by mouth every morning. 30 tablet 5  . glucose blood (ONETOUCH VERIO) test strip 1 each by Other route 2 (two) times daily. And lancets 2/day 100 each 12  . lisinopril (PRINIVIL,ZESTRIL) 5 MG tablet Take 40 mg by mouth every evening.     . metFORMIN (GLUCOPHAGE) 500 MG tablet Take 1,000 mg by mouth 2 (two) times daily.     . pantoprazole (PROTONIX) 40 MG tablet Take 40 mg by mouth daily.    . traZODone (DESYREL) 100 MG tablet Take 2 tablets (200 mg total) by mouth at bedtime. 60 tablet 5   No current facility-administered medications on file prior to visit.     Allergies  Allergen Reactions  . Synthroid [Levothyroxine Sodium] Anaphylaxis  .  Iodine Hives  . Povidone-Iodine Itching and Rash    Family History  Problem Relation Age of Onset  . Asthma    . Diabetes    . Arthritis    . Anesthesia problems Neg Hx   . Hypotension Neg Hx   . Malignant hyperthermia Neg Hx   . Pseudochol deficiency Neg Hx   . Colon polyps Neg Hx   . Colon cancer Neg Hx   . Celiac disease Neg Hx   . Pancreatic cancer Neg Hx   . Stomach cancer Neg Hx   . Ulcerative colitis Neg Hx   . Crohn's disease Neg Hx     BP (!) 146/84   Pulse 92   Ht _0  (1.676 m)   Wt 247 lb (112 kg)   SpO2 93%   BMI 39.87 kg/m   Review of Systems She denies hypoglycemia.      Objective:   Physical Exam VITAL SIGNS:  See vs page GENERAL: no distress Pulses: dorsalis pedis intact bilat.   MSK: no deformity of the feet CV: trace  bilat leg edema Skin:  no ulcer on the feet.  normal color and temp on the feet. Neuro: sensation is intact to touch on the feet.        Assessment & Plan:  Insulin-requiring type 2 DM: good progress on V-GO so far.   Patient is advised the following: Patient Instructions  Please take 1 click with breakfast, 2 with lunch, and 5 with supper.  check your blood sugar twice a day.  vary the time of day when you check, between before the 3 meals, and at bedtime.  also check if you have symptoms of your blood sugar being too high or too low.  please keep a record of the readings and bring it to your next appointment here (or you can bring the meter itself).  You can write it on any piece of paper.  please call us sooner if your blood sugar goes below 70, or if you have a lot of readings over 200.   Please come back for a follow-up appointment in 2 weeks.

## 2016-08-30 ENCOUNTER — Other Ambulatory Visit: Payer: Self-pay | Admitting: Acute Care

## 2016-08-30 DIAGNOSIS — Z87891 Personal history of nicotine dependence: Secondary | ICD-10-CM

## 2016-09-07 ENCOUNTER — Encounter: Payer: Self-pay | Admitting: Acute Care

## 2016-09-07 ENCOUNTER — Ambulatory Visit (HOSPITAL_COMMUNITY): Payer: Managed Care, Other (non HMO)

## 2016-09-07 ENCOUNTER — Telehealth: Payer: Self-pay | Admitting: Acute Care

## 2016-09-07 ENCOUNTER — Telehealth: Payer: Self-pay | Admitting: Gastroenterology

## 2016-09-07 NOTE — Telephone Encounter (Signed)
Pt called to see what she could take or do with the problems she's been having with vomiting, diarrhea. Please advise and call [phone]

## 2016-09-07 NOTE — Telephone Encounter (Signed)
Pt said she had been doing well since her visit here with Dr. Oneida Alar in January. She got a little sick on last Sunday with headache and later started having diarrhea. She has had it all week with at least 12 episodes daily, just watery.  She has not been eating much at all, but has kept hydrated with liquids. She has not had any fever or chills and no vomiting, just some nausea. Please advise!

## 2016-09-08 ENCOUNTER — Emergency Department (HOSPITAL_COMMUNITY)
Admission: EM | Admit: 2016-09-08 | Discharge: 2016-09-08 | Disposition: A | Discharge status: Home / Self Care | Payer: Managed Care, Other (non HMO) | Attending: Emergency Medicine | Admitting: Emergency Medicine

## 2016-09-08 ENCOUNTER — Encounter (HOSPITAL_COMMUNITY): Payer: Self-pay | Admitting: Emergency Medicine

## 2016-09-08 DIAGNOSIS — E119 Type 2 diabetes mellitus without complications: Secondary | ICD-10-CM | POA: Insufficient documentation

## 2016-09-08 DIAGNOSIS — I10 Essential (primary) hypertension: Secondary | ICD-10-CM | POA: Diagnosis not present

## 2016-09-08 DIAGNOSIS — A09 Infectious gastroenteritis and colitis, unspecified: Secondary | ICD-10-CM | POA: Diagnosis not present

## 2016-09-08 DIAGNOSIS — Z794 Long term (current) use of insulin: Secondary | ICD-10-CM | POA: Insufficient documentation

## 2016-09-08 DIAGNOSIS — Z87891 Personal history of nicotine dependence: Secondary | ICD-10-CM | POA: Diagnosis not present

## 2016-09-08 DIAGNOSIS — R197 Diarrhea, unspecified: Secondary | ICD-10-CM | POA: Diagnosis present

## 2016-09-08 DIAGNOSIS — Z79899 Other long term (current) drug therapy: Secondary | ICD-10-CM | POA: Diagnosis not present

## 2016-09-08 LAB — COMPREHENSIVE METABOLIC PANEL
ALT: 19 U/L (ref 14–54)
AST: 17 U/L (ref 15–41)
Albumin: 3.2 g/dL — ABNORMAL LOW (ref 3.5–5.0)
Alkaline Phosphatase: 71 U/L (ref 38–126)
Anion gap: 8 (ref 5–15)
BUN: 10 mg/dL (ref 6–20)
CO2: 27 mmol/L (ref 22–32)
Calcium: 8.9 mg/dL (ref 8.9–10.3)
Chloride: 103 mmol/L (ref 101–111)
Creatinine, Ser: 0.83 mg/dL (ref 0.44–1.00)
GFR calc Af Amer: >60 mL/min (ref >60)
GFR calc non Af Amer: >60 mL/min (ref >60)
Glucose, Bld: 182 mg/dL — ABNORMAL HIGH (ref 65–99)
Potassium: 4 mmol/L (ref 3.5–5.1)
Sodium: 138 mmol/L (ref 135–145)
Total Bilirubin: 0.4 mg/dL (ref 0.3–1.2)
Total Protein: 6.6 g/dL (ref 6.5–8.1)

## 2016-09-08 LAB — C DIFFICILE QUICK SCREEN W PCR REFLEX
C Diff antigen: NEGATIVE
C Diff interpretation: NOT DETECTED
C Diff toxin: NEGATIVE

## 2016-09-08 LAB — URINALYSIS, ROUTINE W REFLEX MICROSCOPIC
Bilirubin Urine: NEGATIVE
Glucose, UA: NEGATIVE mg/dL
Hgb urine dipstick: NEGATIVE
Ketones, ur: NEGATIVE mg/dL
Leukocytes, UA: NEGATIVE
Nitrite: NEGATIVE
Protein, ur: NEGATIVE mg/dL
Specific Gravity, Urine: 1.02 (ref 1.005–1.030)
pH: 5 (ref 5.0–8.0)

## 2016-09-08 LAB — CBC
HCT: 35.9 % — ABNORMAL LOW (ref 36.0–46.0)
Hemoglobin: 11.6 g/dL — ABNORMAL LOW (ref 12.0–15.0)
MCH: 24.7 pg — ABNORMAL LOW (ref 26.0–34.0)
MCHC: 32.3 g/dL (ref 30.0–36.0)
MCV: 76.4 fL — ABNORMAL LOW (ref 78.0–100.0)
Platelets: 276 10*3/uL (ref 150–400)
RBC: 4.7 MIL/uL (ref 3.87–5.11)
RDW: 13.8 % (ref 11.5–15.5)
WBC: 8.4 10*3/uL (ref 4.0–10.5)

## 2016-09-08 LAB — LIPASE, BLOOD: Lipase: 12 U/L (ref 11–51)

## 2016-09-08 MED ORDER — SODIUM CHLORIDE 0.9 % IV BOLUS (SEPSIS)
1000.0000 mL | Freq: Once | INTRAVENOUS | Status: AC
  Administered 2016-09-08: 1000 mL via INTRAVENOUS

## 2016-09-08 MED ORDER — DIPHENOXYLATE-ATROPINE 2.5-0.025 MG PO TABS
2.0000 | ORAL_TABLET | Freq: Once | ORAL | Status: AC
  Administered 2016-09-08: 2 via ORAL
  Filled 2016-09-08: qty 2

## 2016-09-08 MED ORDER — DIPHENOXYLATE-ATROPINE 2.5-0.025 MG PO TABS: 1.0000 | ORAL_TABLET | Freq: Four times a day (QID) | ORAL | 0 refills | Status: DC | PRN

## 2016-09-08 NOTE — Discharge Instructions (Signed)
Follow up with dr. Oneida Alar if not improving

## 2016-09-08 NOTE — ED Triage Notes (Signed)
Patient c/o diarrhea x7 days. Denies any other symptoms. Per patient watery stools. x14 stools in past 24 hours.

## 2016-09-08 NOTE — Telephone Encounter (Signed)
PT SEEN IN ED SAT FEB 10. WILL AWAIT EVALUATION AND CALL PT WITH RECOMMENDATIONS.

## 2016-09-08 NOTE — ED Provider Notes (Signed)
[facility] DEPT Provider Note   CSN: [id number] Arrival date & time: 09/08/16  6578   By signing my name below, I, Hilbert Odor, attest that this documentation has been prepared under the direction and in the presence of Milton Ferguson, MD. Electronically Signed: Hilbert Odor, Scribe. 09/08/16. 1:13 PM. History   Chief Complaint Chief Complaint  Patient presents with  . Diarrhea    HPI Comments: [name] is a 61 y.o. female who presents to the Emergency Department complaining of diarrhea for the past 7 days. She also reports having an associated headache which began 6 days ago and lasted for about 3 days. She denies any headaches currently. She does report some abdominal cramping. She states that she has not used any antibiotics in the past month. She denies any vomiting. She also denies any sick contact recently. She reports no alleviating factors.      The history is provided by the patient. No language interpreter was used.  Diarrhea   This is a new problem. The current episode started more than 2 days ago. The problem occurs 5 to 10 times per day. The problem has been gradually worsening. The stool consistency is described as watery. Associated symptoms include headaches. Pertinent negatives include no vomiting.    Past Medical History:  Diagnosis Date  . Diabetes mellitus   . Thyroid disease     Patient Active Problem List   Diagnosis Date Noted  . Diabetes ([place]) 06/16/2016  . HTN (hypertension) 05/31/2016  . Change in bowel habits 03/03/2014  . Depression 10/13/2013  . Fractured lateral malleolus 08/12/2012  . Left knee sprain 08/12/2012    Past Surgical History:  Procedure Laterality Date  . ABDOMINAL HYSTERECTOMY    . ABDOMINAL SURGERY    . COLONOSCOPY  2005 MAC   TICs, IH  . COLONOSCOPY N/A 03/08/2014   Procedure: COLONOSCOPY;  Surgeon: Danie Binder, MD;  [facility];  Service: Endoscopy;  Laterality: N/A;  1015  . CYSTOSTOMY   11/22/2011   Procedure: CYSTOSTOMY SUPRAPUBIC;  Surgeon: Marissa Nestle, MD;  Location: [facility];  Service: Urology;  Laterality: N/A;  . ESOPHAGOGASTRODUODENOSCOPY N/A 03/08/2014   Procedure: ESOPHAGOGASTRODUODENOSCOPY (EGD);  Surgeon: Danie Binder, MD;  Location: [facility];  Service: Endoscopy;  Laterality: N/A;  . HALLUX VALGUS CORRECTION     bilateral foot surgery for bone repairs-multiple  . VAGINA RECONSTRUCTION SURGERY     tvt 2006- [place]    OB History    Gravida Para Term Preterm AB Living             0   SAB TAB Ectopic Multiple Live Births                   Home Medications    Prior to Admission medications   Medication Sig Start Date End Date Taking? Authorizing Provider  amLODipine (NORVASC) 5 MG tablet Take 5 mg by mouth daily.    Historical Provider, MD  atorvastatin (LIPITOR) 10 MG tablet  07/31/16   Historical Provider, MD  escitalopram (LEXAPRO) 10 MG tablet Take 1 tablet (10 mg total) by mouth every morning. 04/23/16   Cloria Spring, MD  glucose blood (ONETOUCH VERIO) test strip 1 each by Other route 2 (two) times daily. And lancets 2/day 05/30/16   Renato Shin, MD  Insulin Disposable Pump (V-GO 20) KIT 1 Device by Does not apply route daily. 08/28/16   Renato Shin, MD  insulin lispro (HUMALOG) 100  UNIT/ML injection For use in pump, total of 60 units per day 08/28/16   Renato Shin, MD  lisinopril (PRINIVIL,ZESTRIL) 5 MG tablet Take 40 mg by mouth every evening.     Historical Provider, MD  metFORMIN (GLUCOPHAGE) 500 MG tablet Take 1,000 mg by mouth 2 (two) times daily.     Historical Provider, MD  pantoprazole (PROTONIX) 40 MG tablet Take 40 mg by mouth daily.    Historical Provider, MD  traZODone (DESYREL) 100 MG tablet Take 2 tablets (200 mg total) by mouth at bedtime. 04/23/16   Cloria Spring, MD    Family History Family History  Problem Relation Age of Onset  . Asthma    . Diabetes    . Arthritis    . Anesthesia problems Neg Hx   .  Hypotension Neg Hx   . Malignant hyperthermia Neg Hx   . Pseudochol deficiency Neg Hx   . Colon polyps Neg Hx   . Colon cancer Neg Hx   . Celiac disease Neg Hx   . Pancreatic cancer Neg Hx   . Stomach cancer Neg Hx   . Ulcerative colitis Neg Hx   . Crohn's disease Neg Hx     Social History Social History  Substance Use Topics  . Smoking status: Former Smoker    Packs/day: 1.00    Years: 20.00    Types: Cigarettes    Quit date: 11/19/2001  . Smokeless tobacco: Former Systems developer    Quit date: 10/17/2011  . Alcohol use No     Comment: rarely     Allergies   Synthroid [levothyroxine sodium]; Iodine; and Povidone-iodine   Review of Systems Review of Systems  Constitutional: Negative for appetite change and fatigue.  HENT: Negative for congestion, ear discharge and sinus pressure.   Eyes: Negative for discharge.  Cardiovascular: Negative for chest pain.  Gastrointestinal: Positive for diarrhea. Negative for vomiting.  Genitourinary: Negative for frequency and hematuria.  Musculoskeletal: Negative for back pain.  Skin: Negative for rash.  Neurological: Positive for headaches. Negative for seizures.  Psychiatric/Behavioral: Negative for hallucinations.  All other systems reviewed and are negative.    Physical Exam Updated Vital Signs BP 132/66 (BP Location: Right Arm)   Pulse 80   Temp 97.7 F (36.5 C) (Oral)   Resp 18   Ht 5' 6"  (1.676 m)   Wt 245 lb (111.1 kg)   SpO2 98%   BMI 39.54 kg/m   Physical Exam  Constitutional: She is oriented to person, place, and time. She appears well-developed.  HENT:  Head: Normocephalic.  Eyes: Conjunctivae and EOM are normal. No scleral icterus.  Neck: Neck supple. No thyromegaly present.  Cardiovascular: Normal rate and regular rhythm.  Exam reveals no gallop and no friction rub.   No murmur heard. Pulmonary/Chest: No stridor. She has no wheezes. She has no rales. She exhibits no tenderness.  Abdominal: She exhibits no  distension. There is no tenderness. There is no rebound.  Musculoskeletal: Normal range of motion. She exhibits no edema.  Lymphadenopathy:    She has no cervical adenopathy.  Neurological: She is oriented to person, place, and time. She exhibits normal muscle tone. Coordination normal.  Skin: No rash noted. No erythema.  Psychiatric: She has a normal mood and affect. Her behavior is normal.  Nursing note and vitals reviewed.    ED Treatments / Results  DIAGNOSTIC STUDIES: Oxygen Saturation is 98% on RA, normal by my interpretation.    COORDINATION OF CARE: 1:03 PM Discussed  treatment plan with pt at bedside and pt agreed to plan. I will give the patient IV fluids and check her labs.  Labs (all labs ordered are listed, but only abnormal results are displayed) Labs Reviewed  COMPREHENSIVE METABOLIC PANEL - Abnormal; Notable for the following:       Result Value   Glucose, Bld 182 (*)    Albumin 3.2 (*)    All other components within normal limits  CBC - Abnormal; Notable for the following:    Hemoglobin 11.6 (*)    HCT 35.9 (*)    MCV 76.4 (*)    MCH 24.7 (*)    All other components within normal limits  C DIFFICILE QUICK SCREEN W PCR REFLEX  LIPASE, BLOOD  URINALYSIS, ROUTINE W REFLEX MICROSCOPIC    EKG  EKG Interpretation None       Radiology No results found.  Procedures Procedures (including critical care time)  Medications Ordered in ED Medications  sodium chloride 0.9 % bolus 1,000 mL (not administered)     Initial Impression / Assessment and Plan / ED Course  I have reviewed the triage vital signs and the nursing notes.  Pertinent labs & imaging results that were available during my care of the patient were reviewed by me and considered in my medical decision making (see chart for details).     Patient with diarrhea. C. difficile negative. Patient is instructed to drink plenty of fluids and she is given some Lomotil and will follow-up with her GI  doctor next week if not improving  Final Clinical Impressions(s) / ED Diagnoses   Final diagnoses:  None    New Prescriptions New Prescriptions   No medications on file   The chart was scribed for me under my direct supervision.  I personally performed the history, physical, and medical decision making and all procedures in the evaluation of this patient.Milton Ferguson, MD 09/08/16 [phone]

## 2016-09-11 ENCOUNTER — Ambulatory Visit: Payer: Self-pay | Admitting: Endocrinology

## 2016-09-11 ENCOUNTER — Encounter: Payer: Self-pay | Admitting: Gastroenterology

## 2016-09-11 NOTE — Telephone Encounter (Addendum)
Called patient TO DISCUSS CONCERNS. WENT LACTOSE FREE AND THAT WORKED. SYMPTOMS FLARED FEB 4. WOKE UP WITH A HEADACHE.  Friday HAVING VERY LOOSE STOOLS & SAT WAS IN ED 7 HRS AND GOT IV STUFF. STOOL AND LABS UNREMARKBLE. WAS SPENDING 7 HRS A DAY IN THE BATHROOM. STILL TAKING LOMOTIL BID BUT CAN BE UP TO QID. TRIED PEDIALYTE BUT RAN SUGAR UP. NO RESPIRATORY Sx.   ON LEXAPRO A LONG TIME AND IT WORKS. METFORMIN MAY BE CULPRIT. PT AWARE.

## 2016-09-11 NOTE — Telephone Encounter (Signed)
Will route to lung nodule pool

## 2016-09-12 NOTE — Telephone Encounter (Signed)
Per referral notes 09/11/16 pt was re scheduled for [facility] and CT on 09/14/16. Nothing further needed.

## 2016-09-12 NOTE — Telephone Encounter (Signed)
Noted  

## 2016-09-13 ENCOUNTER — Ambulatory Visit (INDEPENDENT_AMBULATORY_CARE_PROVIDER_SITE_OTHER): Payer: Managed Care, Other (non HMO) | Admitting: Gastroenterology

## 2016-09-13 ENCOUNTER — Encounter: Payer: Self-pay | Admitting: Gastroenterology

## 2016-09-13 ENCOUNTER — Telehealth: Payer: Self-pay

## 2016-09-13 DIAGNOSIS — R194 Change in bowel habit: Secondary | ICD-10-CM | POA: Diagnosis not present

## 2016-09-13 MED ORDER — DIPHENOXYLATE-ATROPINE 2.5-0.025 MG PO TABS: 1.0000 | ORAL_TABLET | Freq: Four times a day (QID) | ORAL | 0 refills | Status: DC | PRN

## 2016-09-13 NOTE — Progress Notes (Signed)
ON RECALL  °

## 2016-09-13 NOTE — Addendum Note (Signed)
Addended by: Danie Binder on: 09/13/2016 10:38 AM   Modules accepted: Orders

## 2016-09-13 NOTE — Telephone Encounter (Signed)
  FYI: Dr. Oneida Alar from [facility] called to give an update on the patient. Patient recently had to go to the ER due to diarrhea. Dr. Oneida Alar stated the diarrhea could have been coming from the Metformin 1000 mg bid. Due to this episode Dr. Oneida Alar has decreased the metformin down to 500 mg bid. Patient has an appointment on 09/18/2016 to discuss further.

## 2016-09-13 NOTE — Assessment & Plan Note (Addendum)
MOST LIKELY DUE TO INCREASE IN METFORMIN. CLINICALLY IMPROVED AFTER REDUCING LACTOSE AND METFORMIN DOSE.  REDUCE METFORMIN TO 500 MG TWICE DAILY. SEE DR. ELLISON NEXT WEEK. I PERSONALLY CONTACTED DR. ELLISON. HE'S OUT SICK. SPOKE TO MEGAN AD SHE IS WARE OF DOSE ADJUSTMENT. CONTINUE LACTOSE FREE DIET FOR THE NEXT 3 MOS. USE LOMOTIL IF NEED TO CONTROL LOOSE STOOLS/DIARRHEA. CALL WITH QUESTIONS OR CONCERNS OR USE MY CHART TO SEND ME A MESSAGE DIRECTLY.  FOLLOW UP IN 4 MOS.   GREATER THAN 50% WAS SPENT IN COUNSELING & COORDINATION OF CARE WITH THE PATIENT: DISCUSSED DIFFERENTIAL DIAGNOSIS, MEDICATIONS/SIDE EFFECTS, BENEFITS, AND MANAGEMENT OF DIARRHEA/LOOSE STOOLS. TOTAL ENCOUNTER TIME: 25 MINS.

## 2016-09-13 NOTE — Progress Notes (Signed)
   Subjective:    Patient ID: [name], female    DOB: 02/08/1955, 61 y.o.   MRN: [id number] HPI Felt better after SHE CUT BACK ON lactose. SEEN IN ED ON SAT DUE TO SEVERE LOWER ABDOMINAL PAIN AND RSEVRE DIARRHEA. GIVEN LOMOTIL AND IT HELPS.NEEDS A REFILL.STOPPED METFORMIN AND NOW HAVING #6 STOOL AND FEELING LIKE IT'S GETTING BACK TO #5.  INSULIN PUMP HELPING HER CONTROL HER BLOOD SUGARS NOW 100-150 INSTEAD OF 300s.   NO CHILDREN. HOST OF NIECES AND NEPHEWS. LIKES TO GO TO [place] AND [place] TO PLAY SLOTS/3 CARD POKER. PT DENIES FEVER, CHILLS, HEMATOCHEZIA, HEMATEMESIS, nausea, vomiting, melena, diarrhea, CHEST PAIN, SHORTNESS OF BREATH,  CHANGE IN BOWEL IN HABITS, constipation, abdominal pain, problems swallowing, OR  heartburn or indigestion.  Past Medical History:  Diagnosis Date  . Diabetes mellitus   . Thyroid disease    Past Surgical History:  Procedure Laterality Date  . ABDOMINAL HYSTERECTOMY    . ABDOMINAL SURGERY    . COLONOSCOPY  2005 MAC   TICs, IH  . COLONOSCOPY N/A 03/08/2014     . CYSTOSTOMY  11/22/2011     . ESOPHAGOGASTRODUODENOSCOPY N/A 03/08/2014     . HALLUX VALGUS CORRECTION     bilateral foot surgery for bone repairs-multiple  . VAGINA RECONSTRUCTION SURGERY     tvt 2006- [place]   Allergies  Allergen Reactions  . Synthroid [Levothyroxine Sodium] Anaphylaxis  . Iodine Hives  . Povidone-Iodine Itching and Rash   Current Outpatient Prescriptions  Medication Sig Dispense Refill  . amLODipine (NORVASC) 5 MG tablet Take 5 mg by mouth daily.    Marland Kitchen atorvastatin (LIPITOR) 10 MG tablet Take 10 mg by mouth daily at 6 PM.     . LOMOTIL 2.5-0.025 MG tablet Take 1 tablet BID for diarrhea or loose stools. bid   . escitalopram (LEXAPRO) 10 MG tablet Take 1 tablet QAM.    .      .      . insulin lispro (HUMALOG) 100 UNIT/ML injection For use in pump, total of 60 units per day    . lisinopril (PRINIVIL,ZESTRIL) 40 MG tablet Take 40 mg by mouth daily.    .  pantoprazole (PROTONIX) 40 MG tablet Take 40 mg by mouth daily.    . traZODone (DESYREL) 100 MG tablet Take 2 tablets (200 mg total) by mouth at bedtime.    .       Review of Systems PER HPI OTHERWISE ALL SYSTEMS ARE NEGATIVE.    Objective:   Physical Exam  Constitutional: She is oriented to person, place, and time. She appears well-developed and well-nourished. No distress.  HENT:  Head: Normocephalic and atraumatic.  Mouth/Throat: Oropharynx is clear and moist. No oropharyngeal exudate.  Eyes: Pupils are equal, round, and reactive to light. No scleral icterus.  Neck: Normal range of motion. Neck supple.  Cardiovascular: Normal rate, regular rhythm and normal heart sounds.   Pulmonary/Chest: Effort normal and breath sounds normal. No respiratory distress.  Abdominal: Soft. Bowel sounds are normal. She exhibits no distension. There is no tenderness.  Musculoskeletal: She exhibits no edema.  INSULIN PUMP ON R ARM  Lymphadenopathy:    She has no cervical adenopathy.  Neurological: She is alert and oriented to person, place, and time.  Psychiatric: She has a normal mood and affect.  Vitals reviewed.     Assessment & Plan:

## 2016-09-13 NOTE — Progress Notes (Signed)
cc'ed to pcp °

## 2016-09-13 NOTE — Patient Instructions (Addendum)
REDUCE METFORMIN TO 500 MG TWICE DAILY.   CONTINUE LACTOSE FREE DIET FOR THE NEXT 3 MOS.  USE LOMOTIL IF NEED TO CONTROL LOOSE STOOLS/DIARRHEA.  PLEASE CALL WITH QUESTIONS OR CONCERNS. YOU CAN USE MY CHART TO SEND ME A MESSAGE DIRECTLY.  FOLLOW UP IN 4 MOS.

## 2016-09-14 ENCOUNTER — Encounter: Payer: Self-pay | Admitting: Acute Care

## 2016-09-14 ENCOUNTER — Ambulatory Visit (INDEPENDENT_AMBULATORY_CARE_PROVIDER_SITE_OTHER)
Admission: RE | Admit: 2016-09-14 | Discharge: 2016-09-14 | Disposition: A | Discharge status: Home / Self Care | Payer: Managed Care, Other (non HMO) | Source: Ambulatory Visit | Attending: Acute Care | Admitting: Acute Care

## 2016-09-14 ENCOUNTER — Ambulatory Visit (INDEPENDENT_AMBULATORY_CARE_PROVIDER_SITE_OTHER): Payer: Managed Care, Other (non HMO) | Admitting: Acute Care

## 2016-09-14 DIAGNOSIS — Z87891 Personal history of nicotine dependence: Secondary | ICD-10-CM

## 2016-09-14 NOTE — Progress Notes (Signed)
Shared Decision Making Visit Lung Cancer Screening Program [phone])   Eligibility:  Age 61 y.o.  Pack Years Smoking History Calculation 70-pack-year smoking history (# packs/per year x # years smoked)  Recent History of coughing up blood  no  Unexplained weight loss? no ( >Than 15 pounds within the last 6 months )  Prior History Lung / other cancer no (Diagnosis within the last 5 years already requiring surveillance chest CT Scans).  Smoking Status Former Smoker  Former Smokers: Years since quit: 86 years  Quit Date:2004  Visit Components:  Discussion included one or more decision making aids. yes  Discussion included risk/benefits of screening. yes  Discussion included potential follow up diagnostic testing for abnormal scans. yes  Discussion included meaning and risk of over diagnosis. yes  Discussion included meaning and risk of False Positives. yes  Discussion included meaning of total radiation exposure. yes  Counseling Included:  Importance of adherence to annual lung cancer LDCT screening. yes  Impact of comorbidities on ability to participate in the program. yes  Ability and willingness to under diagnostic treatment. yes  Smoking Cessation Counseling:  Current Smokers:   Discussed importance of smoking cessation. NA  Information about tobacco cessation classes and interventions provided to patient. yes  Patient provided with "ticket" for LDCT Scan. yes  Symptomatic Patient. no  Counseling  Diagnosis Code: Tobacco Use Z72.0  Asymptomatic Patient yes  Counseling (Intermediate counseling: > three minutes counseling) Z1696  Former Smokers:   Discussed the importance of maintaining cigarette abstinence. yes  Diagnosis Code: Personal History of Nicotine Dependence. V89.381  Information about tobacco cessation classes and interventions provided to patient. Yes  Patient provided with "ticket" for LDCT Scan. yes  Written Order for Lung Cancer  Screening with LDCT placed in Epic. Yes (CT Chest Lung Cancer Screening Low Dose W/O CM) [id number] Z12.2-Screening of respiratory organs Z87.891-Personal history of nicotine dependence  I spent 25 minutes of face to face time with Ms. Rowen discussing the risks and benefits of lung cancer screening. We viewed a power point together that explained in detail the above noted topics. We took the time to pause the power point at intervals to allow for questions to be asked and answered to ensure understanding. We discussed that she had taken the single most powerful action possible to decrease her risk of developing lung cancer when she quit smoking. I counseled her to remain smoke free, and to contact me if she ever had the desire to smoke again so that I can provide resources and tools to help support the effort to remain smoke free. We discussed the time and location of the scan, and that either [facility] or I will call with the results within  24-48 hours of receiving them. She has my card and contact information in the event she needs to speak with me, in addition to a copy of the power point we reviewed as a resource. Ms. Hal Morales verbalized understanding of all of the above and had no further questions upon leaving the office.   We discussed that there is a high incidence of the finding of coronary artery disease on these scans. I explained that this is a non-gated exam and therefore degree or severity cannot be determined. She is currently taking statins. She verbalized understanding of the above and had no further questions.   I spent between 4 and 5 minutes counseling patients on remaining smoke free.   Magdalen Spatz, NP 09/14/2016

## 2016-09-18 ENCOUNTER — Telehealth: Payer: Self-pay | Admitting: Acute Care

## 2016-09-18 ENCOUNTER — Ambulatory Visit (INDEPENDENT_AMBULATORY_CARE_PROVIDER_SITE_OTHER): Payer: Managed Care, Other (non HMO) | Admitting: Endocrinology

## 2016-09-18 ENCOUNTER — Encounter: Payer: Self-pay | Admitting: Endocrinology

## 2016-09-18 VITALS — BP 132/82 | HR 94 | Ht 66.0 in | Wt 245.0 lb

## 2016-09-18 DIAGNOSIS — E119 Type 2 diabetes mellitus without complications: Secondary | ICD-10-CM | POA: Diagnosis not present

## 2016-09-18 DIAGNOSIS — Z794 Long term (current) use of insulin: Secondary | ICD-10-CM | POA: Diagnosis not present

## 2016-09-18 DIAGNOSIS — R9389 Abnormal findings on diagnostic imaging of other specified body structures: Secondary | ICD-10-CM

## 2016-09-18 MED ORDER — METFORMIN HCL ER 500 MG PO TB24: 500.0000 mg | ORAL_TABLET | Freq: Two times a day (BID) | ORAL | 3 refills | Status: DC

## 2016-09-18 NOTE — Progress Notes (Signed)
Subjective:    Patient ID: [name], female    DOB: 02/08/1955, 61 y.o.   MRN: [id number]  HPI Pt returns for f/u of diabetes mellitus: DM type: Insulin-requiring type 2 Dx'ed: 6789 Complications: none Therapy: insulin since soon after dx.  GDM: never (G0) DKA: never Severe hypoglycemia: never Pancreatitis: never Other: she takes multiple daily injections; she stopped victoza, due to lack of effect; she uses V-GO-20.  Interval history: She reduced metformin, due to nausea.  no cbg record, but states cbg's vary from 115-200's.  It is highest at hs.  She takes take 1 click with breakfast, 2 with lunch, and 5 with supper. Past Medical History:  Diagnosis Date  . Diabetes mellitus   . Thyroid disease     Past Surgical History:  Procedure Laterality Date  . ABDOMINAL HYSTERECTOMY    . ABDOMINAL SURGERY    . COLONOSCOPY  2005 MAC   TICs, IH  . COLONOSCOPY N/A 03/08/2014   Procedure: COLONOSCOPY;  Surgeon: Danie Binder, MD;  [facility];  Service: Endoscopy;  Laterality: N/A;  1015  . CYSTOSTOMY  11/22/2011   Procedure: CYSTOSTOMY SUPRAPUBIC;  Surgeon: Marissa Nestle, MD;  Location: [facility];  Service: Urology;  Laterality: N/A;  . ESOPHAGOGASTRODUODENOSCOPY N/A 03/08/2014   Procedure: ESOPHAGOGASTRODUODENOSCOPY (EGD);  Surgeon: Danie Binder, MD;  [facility];  Service: Endoscopy;  Laterality: N/A;  . HALLUX VALGUS CORRECTION     bilateral foot surgery for bone repairs-multiple  . VAGINA RECONSTRUCTION SURGERY     tvt 2006- [place] History   Social History  . Marital status: Single    Spouse name: N/A  . Number of children: N/A  . Years of education: N/A   Occupational History  . Not on file.   Social History Main Topics  . Smoking status: Former Smoker    Packs/day: 1.00    Years: 20.00    Types: Cigarettes    Quit date: 11/19/2001  . Smokeless tobacco: Former Systems developer    Quit date: 10/17/2011  . Alcohol use No   Comment: rarely  . Drug use: No  . Sexual activity: Yes    Birth control/ protection: Surgical   Other Topics Concern  . Not on file   Social History Narrative  . No narrative on file    Current Outpatient Prescriptions on File Prior to Visit  Medication Sig Dispense Refill  . amLODipine (NORVASC) 5 MG tablet Take 5 mg by mouth daily.    Marland Kitchen atorvastatin (LIPITOR) 10 MG tablet Take 10 mg by mouth daily at 6 PM.     . diphenoxylate-atropine (LOMOTIL) 2.5-0.025 MG tablet Take 1 tablet by mouth 4 (four) times daily as needed for diarrhea or loose stools. 30 tablet 0  . escitalopram (LEXAPRO) 10 MG tablet Take 1 tablet (10 mg total) by mouth every morning. 30 tablet 5  . glucose blood (ONETOUCH VERIO) test strip 1 each by Other route 2 (two) times daily. And lancets 2/day 100 each 12  . Insulin Disposable Pump (V-GO 20) KIT 1 Device by Does not apply route daily. 30 kit 11  . insulin lispro (HUMALOG) 100 UNIT/ML injection For use in pump, total of 60 units per day 20 mL 11  . lisinopril (PRINIVIL,ZESTRIL) 40 MG tablet Take 40 mg by mouth daily.    . pantoprazole (PROTONIX) 40 MG tablet Take 40 mg by mouth daily.    . traZODone (DESYREL) 100 MG tablet  Take 2 tablets (200 mg total) by mouth at bedtime. 60 tablet 5   No current facility-administered medications on file prior to visit.     Allergies  Allergen Reactions  . Synthroid [Levothyroxine Sodium] Anaphylaxis  . Iodine Hives  . Povidone-Iodine Itching and Rash    Family History  Problem Relation Age of Onset  . Asthma    . Diabetes    . Arthritis    . Anesthesia problems Neg Hx   . Hypotension Neg Hx   . Malignant hyperthermia Neg Hx   . Pseudochol deficiency Neg Hx   . Colon polyps Neg Hx   . Colon cancer Neg Hx   . Celiac disease Neg Hx   . Pancreatic cancer Neg Hx   . Stomach cancer Neg Hx   . Ulcerative colitis Neg Hx   . Crohn's disease Neg Hx     BP 132/82   Pulse 94   Ht _0  (1.676 m)   Wt 245 lb (111.1  kg)   SpO2 94%   BMI 39.54 kg/m    Review of Systems She denies hypoglycemia    Objective:   Physical Exam VITAL SIGNS:  See vs page GENERAL: no distress Pulses: dorsalis pedis intact bilat.   MSK: no deformity of the feet CV: no leg edema Skin:  no ulcer on the feet.  normal color and temp on the feet. Neuro: sensation is intact to touch on the feet.        Assessment & Plan:  Nausea, new, prob due to metformin Insulin-requiring type 2 DM: she needs increased rx, especially at supper.   Patient is advised the following: Patient Instructions  I have sent a prescription to your pharmacy, to change metformin to extended-release. Please take 1 click with breakfast, 2 with lunch, and 7 with supper.  check your blood sugar twice a day.  vary the time of day when you check, between before the 3 meals, and at bedtime.  also check if you have symptoms of your blood sugar being too high or too low.  please keep a record of the readings and bring it to your next appointment here (or you can bring the meter itself).  You can write it on any piece of paper.  please call us sooner if your blood sugar goes below 70, or if you have a lot of readings over 200.   Please come back for a follow-up appointment in 2 months.

## 2016-09-18 NOTE — Telephone Encounter (Signed)
I have called [name] with the results of her low-dose screening CT. Her scan was read as a lung RADS 4B, Lung RADS 4 B indicates suspicious findings for which additional diagnostic testing and or tissue sampling is recommended. I explained to her that the next step would be to order a PET scan. We had discussed this in the shared decision-making visit, and she verbalized understanding. I explained to her that I had discussed her scan with Dr. Baltazar Apo, and he agrees that a PET scan is the next best step for her care. I will order the PET scan, and I told her I would call her with results as soon as I have them. I also let her know that I would send a copy of this report to her primary care provider Dr. Renato Shin. She verbalized understanding of the above and had no further questions at completion of the call. She does have my contact information in the event she has questions in the future.

## 2016-09-18 NOTE — Patient Instructions (Addendum)
I have sent a prescription to your pharmacy, to change metformin to extended-release. Please take 1 click with breakfast, 2 with lunch, and 7 with supper.  check your blood sugar twice a day.  vary the time of day when you check, between before the 3 meals, and at bedtime.  also check if you have symptoms of your blood sugar being too high or too low.  please keep a record of the readings and bring it to your next appointment here (or you can bring the meter itself).  You can write it on any piece of paper.  please call us sooner if your blood sugar goes below 70, or if you have a lot of readings over 200.   Please come back for a follow-up appointment in 2 months.

## 2016-09-24 ENCOUNTER — Other Ambulatory Visit: Payer: Self-pay | Admitting: *Deleted

## 2016-09-28 ENCOUNTER — Ambulatory Visit (HOSPITAL_COMMUNITY)
Admission: RE | Admit: 2016-09-28 | Discharge: 2016-09-28 | Disposition: A | Discharge status: Home / Self Care | Payer: Managed Care, Other (non HMO) | Source: Ambulatory Visit | Attending: Acute Care | Admitting: Acute Care

## 2016-09-28 DIAGNOSIS — R911 Solitary pulmonary nodule: Secondary | ICD-10-CM | POA: Diagnosis not present

## 2016-09-28 DIAGNOSIS — R938 Abnormal findings on diagnostic imaging of other specified body structures: Secondary | ICD-10-CM | POA: Diagnosis present

## 2016-09-28 DIAGNOSIS — E279 Disorder of adrenal gland, unspecified: Secondary | ICD-10-CM | POA: Insufficient documentation

## 2016-09-28 DIAGNOSIS — R9389 Abnormal findings on diagnostic imaging of other specified body structures: Secondary | ICD-10-CM

## 2016-09-28 LAB — GLUCOSE, CAPILLARY: Glucose-Capillary: 244 mg/dL — ABNORMAL HIGH (ref 65–99)

## 2016-09-28 MED ORDER — FLUDEOXYGLUCOSE F - 18 (FDG) INJECTION: 12.5000 | Freq: Once | INTRAVENOUS | Status: DC | PRN

## 2016-10-02 ENCOUNTER — Encounter: Payer: Self-pay | Admitting: Thoracic Surgery (Cardiothoracic Vascular Surgery)

## 2016-10-02 ENCOUNTER — Other Ambulatory Visit: Payer: Self-pay | Admitting: *Deleted

## 2016-10-02 ENCOUNTER — Institutional Professional Consult (permissible substitution) (INDEPENDENT_AMBULATORY_CARE_PROVIDER_SITE_OTHER): Payer: Managed Care, Other (non HMO) | Admitting: Thoracic Surgery (Cardiothoracic Vascular Surgery)

## 2016-10-02 VITALS — BP 175/89 | Resp 16 | Ht 66.0 in | Wt 250.0 lb

## 2016-10-02 DIAGNOSIS — R911 Solitary pulmonary nodule: Secondary | ICD-10-CM

## 2016-10-02 DIAGNOSIS — E785 Hyperlipidemia, unspecified: Secondary | ICD-10-CM | POA: Insufficient documentation

## 2016-10-02 DIAGNOSIS — E782 Mixed hyperlipidemia: Secondary | ICD-10-CM | POA: Insufficient documentation

## 2016-10-02 DIAGNOSIS — D381 Neoplasm of uncertain behavior of trachea, bronchus and lung: Secondary | ICD-10-CM | POA: Diagnosis not present

## 2016-10-02 DIAGNOSIS — D3501 Benign neoplasm of right adrenal gland: Secondary | ICD-10-CM | POA: Insufficient documentation

## 2016-10-02 NOTE — Progress Notes (Signed)
[address]       [place] [address]             [phone]      PCP is Lujean Amel, MD Referring Provider is Lujean Amel, MD  Chief Complaint  Patient presents with  . Lung Lesion    RULOBE...detected on [facility] CT CHEST 09/17/16    HPI: [name] is a 61 year old woman sent for consultation regarding a right upper lobe nodule.  [name] is a 61 year old woman with a history of tobacco abuse (quit in 2004), insulin-dependent diabetes, hypertension, hyperlipidemia, gastroesophageal reflux, and thyroid disease. She started smoking at about 61 years old and smoked about a pack a day until age 55. She has no history of COPD, asthma or wheezing.  She recently saw Dr. Dorthy Cooler for an annual visit and asked about a low-dose screening CT. She met criteria and was referred. That was done on 09/14/2016. It showed a 1.5 cm spiculated nodule in the right upper lobe. A PET CT showed the nodule is mildly hypermetabolic with an SUV of 3. There was no hypermetabolic adenopathy or evidence of distant metastases. She did have an adrenal nodule consistent with a benign adenoma.  She works as a Recruitment consultant. She lives by herself. She quit smoking 14 years ago. She denies chest pain, pressure, or tightness. She denies shortness of breath, cough, hemoptysis, and wheezing. She has not had a recent change in appetite or weight loss. She denies any unusual headaches or visual changes.  Zubrod Score: At the time of surgery this patient's most appropriate activity status/level should be described as: _0     0    Normal activity, no symptoms _1     1    Restricted in physical strenuous activity but ambulatory, able to do out light work _2     2    Ambulatory and capable of self care, unable to do work activities, up and about >50 % of waking hours                              _3     3    Only limited self care, in bed greater than 50% of waking hours _4     4    Completely  disabled, no self care, confined to bed or chair _5     5    Moribund  Past Medical History:  Diagnosis Date  . Chronic pain   . Diabetes mellitus   . GERD (gastroesophageal reflux disease)   . Hyperlipidemia   . Hypertension   . Thyroid disease     Past Surgical History:  Procedure Laterality Date  . ABDOMINAL HYSTERECTOMY    . ABDOMINAL SURGERY    . COLONOSCOPY  2005 MAC   TICs, IH  . COLONOSCOPY N/A 03/08/2014   Procedure: COLONOSCOPY;  Surgeon: Danie Binder, MD;  Location[facility];  Service: Endoscopy;  Laterality: N/A;  1015  . CYSTOSTOMY  11/22/2011   Procedure: CYSTOSTOMY SUPRAPUBIC;  Surgeon: Marissa Nestle, MD;  Location: [facility];  Service: Urology;  Laterality: N/A;  . ESOPHAGOGASTRODUODENOSCOPY N/A 03/08/2014   Procedure: ESOPHAGOGASTRODUODENOSCOPY (EGD);  Surgeon: Danie Binder, MD;  Location: [facility];  Service: Endoscopy;  Laterality: N/A;  . HALLUX VALGUS CORRECTION     bilateral foot surgery for bone repairs-multiple  . VAGINA RECONSTRUCTION SURGERY     tvt 2006- [place]  Family History  Problem Relation Age of Onset  . Asthma    . Diabetes    . Arthritis    . Anesthesia problems Neg Hx   . Hypotension Neg Hx   . Malignant hyperthermia Neg Hx   . Pseudochol deficiency Neg Hx   . Colon polyps Neg Hx   . Colon cancer Neg Hx   . Celiac disease Neg Hx   . Pancreatic cancer Neg Hx   . Stomach cancer Neg Hx   . Ulcerative colitis Neg Hx   . Crohn's disease Neg Hx     Social History Social History  Substance Use Topics  . Smoking status: Former Smoker    Packs/day: 1.00    Years: 20.00    Types: Cigarettes    Quit date: 11/19/2001  . Smokeless tobacco: Former Systems developer    Quit date: 10/17/2011  . Alcohol use No     Comment: rarely    Current Outpatient Prescriptions  Medication Sig Dispense Refill  . amLODipine (NORVASC) 5 MG tablet Take 5 mg by mouth daily.    Marland Kitchen atorvastatin (LIPITOR) 10 MG tablet Take 10 mg by mouth daily at 6  PM.     . diphenoxylate-atropine (LOMOTIL) 2.5-0.025 MG tablet Take 1 tablet by mouth 4 (four) times daily as needed for diarrhea or loose stools. 30 tablet 0  . escitalopram (LEXAPRO) 10 MG tablet Take 1 tablet (10 mg total) by mouth every morning. 30 tablet 5  . glucose blood (ONETOUCH VERIO) test strip 1 each by Other route 2 (two) times daily. And lancets 2/day 100 each 12  . Insulin Disposable Pump (V-GO 20) KIT 1 Device by Does not apply route daily. 30 kit 11  . insulin lispro (HUMALOG) 100 UNIT/ML injection For use in pump, total of 60 units per day 20 mL 11  . lisinopril (PRINIVIL,ZESTRIL) 40 MG tablet Take 40 mg by mouth daily.    . metFORMIN (GLUCOPHAGE-XR) 500 MG 24 hr tablet Take 1 tablet (500 mg total) by mouth 2 (two) times daily. 180 tablet 3  . pantoprazole (PROTONIX) 40 MG tablet Take 40 mg by mouth daily.    . traZODone (DESYREL) 100 MG tablet Take 2 tablets (200 mg total) by mouth at bedtime. 60 tablet 5   No current facility-administered medications for this visit.    Facility-Administered Medications Ordered in Other Visits  Medication Dose Route Frequency Provider Last Rate Last Dose  . fludeoxyglucose F - 18 (FDG) injection 85.2 millicurie  77.8 millicurie Intravenous Once PRN Rolm Baptise, MD        Allergies  Allergen Reactions  . Synthroid [Levothyroxine Sodium] Anaphylaxis  . Iodine Hives  . Povidone-Iodine Itching and Rash    Review of Systems  Constitutional: Negative for activity change, appetite change, chills, fever and unexpected weight change.  HENT: Negative for dental problem, trouble swallowing and voice change.   Eyes: Negative for visual disturbance.  Respiratory: Negative for cough, shortness of breath and wheezing.   Cardiovascular: Negative for chest pain.  Gastrointestinal: Negative for abdominal pain and blood in stool.  Endocrine:       Uses insulin pump  Genitourinary: Negative for difficulty urinating and dysuria.  Musculoskeletal:  Positive for arthralgias and back pain. Negative for gait problem.  Neurological: Negative for syncope, weakness and headaches.  Hematological: Negative for adenopathy. Does not bruise/bleed easily.  All other systems reviewed and are negative.   BP (!) 175/89 (BP Location: Left Arm, Patient Position: Sitting, Cuff Size: Large)  Resp 16   Ht _0  (1.676 m)   Wt 250 lb (113.4 kg)   SpO2 95% Comment: ON RA  BMI 40.35 kg/m  Physical Exam  Constitutional: She is oriented to person, place, and time. She appears well-developed and well-nourished. No distress.  Obese  HENT:  Head: Normocephalic and atraumatic.  Mouth/Throat: No oropharyngeal exudate.  Eyes: Conjunctivae and EOM are normal. No scleral icterus.  Neck: Neck supple. No thyromegaly present.  Cardiovascular: Normal rate, regular rhythm, normal heart sounds and intact distal pulses.  Exam reveals no gallop and no friction rub.   No murmur heard. Pulmonary/Chest: Effort normal and breath sounds normal. No respiratory distress. She has no wheezes. She has no rales.  Abdominal: Soft. She exhibits no distension. There is no tenderness.  Musculoskeletal: Normal range of motion. She exhibits no edema.  Lymphadenopathy:    She has no cervical adenopathy.  Neurological: She is alert and oriented to person, place, and time. No cranial nerve deficit.  No focal motor deficit  Skin: Skin is warm and dry.  Vitals reviewed.    Diagnostic Tests: CT CHEST WITHOUT CONTRAST LOW-DOSE FOR LUNG CANCER SCREENING  TECHNIQUE: Multidetector CT imaging of the chest was performed following the standard protocol without IV contrast.  COMPARISON:  Partial comparison to [facility] abdomen/pelvis dated 06/10/2012  FINDINGS: Cardiovascular: The heart is normal in size. No pericardial effusion.  Mild atherosclerotic calcifications of the aortic arch.  Mediastinum/Nodes: Small mediastinal lymph nodes, including a 10 mm short axis node  in the right paratracheal region (series 2/ image 20).  Bilateral hilar regions are poorly evaluated in the absence of intravenous contrast administration, although a 9 mm right hilar node may be present (series 2/ image 23), equivocal.  Visualized thyroid is unremarkable.  Lungs/Pleura: Evaluation lung parenchyma is mildly constrained by respiratory motion.  15.1 mm (volumetric mean) spiculated nodule in the posterior right upper lobe (series 3/ image 117). Linear pleural tail.  Mild subpleural atelectasis in the lingula.  No focal consolidation.  Mild centrilobular emphysematous changes.  No pleural effusion or pneumothorax.  Upper Abdomen: 1.6 cm low-density right adrenal nodule (series 2/ image 71), unchanged since 2013, likely reflecting a benign adrenal adenoma.  Musculoskeletal: Mild degenerative changes of the visualized thoracolumbar spine.  IMPRESSION: 15.1 mm spiculated posterior right upper lobe nodule, suspicious for primary bronchogenic neoplasm. Lung-RADS Category 4B, suspicious. Additional imaging evaluation or consultation with pulmonary medicine or thoracic surgery recommended. Discussion at multidisciplinary tumor board is suggested for consideration of sampling, surgical intervention, and/or PET-CT.  Possible mildly prominent right hilar and right paratracheal nodes, indeterminate, poorly evaluated on unenhanced CT.  1.6 cm right adrenal nodule is unchanged since 2013, compatible with a benign adrenal adenoma.   Electronically Signed   By: Julian Hy M.D.   On: 09/17/2016 08:29 NUCLEAR MEDICINE PET SKULL BASE TO THIGH  TECHNIQUE: 12.5 mCi F-18 FDG was injected intravenously. Full-ring PET imaging was performed from the skull base to thigh after the radiotracer. CT data was obtained and used for attenuation correction and anatomic localization.  FASTING BLOOD GLUCOSE:  Value: 244 mg/dl  COMPARISON:  CT chest  09/14/2016  FINDINGS: NECK  No hypermetabolic lymph nodes in the neck.  CHEST  The 15 mm spiculated posterior right upper lobe pulmonary nodule is hypermetabolic with SUV max = 3. No evidence for hypermetabolic activity and mediastinal or hilar lymph nodes. No evidence for lymphadenopathy in the mediastinum or right hilum by CT imaging.  Atherosclerotic calcification noted  in the thoracic aorta without aneurysm.  ABDOMEN/PELVIS  No abnormal hypermetabolic activity within the liver, pancreas, adrenal glands, or spleen. No hypermetabolic lymph nodes in the abdomen or pelvis.  16 mm right adrenal nodule, reportedly stable since 2013 shows no evidence for hypermetabolism on PET imaging. Imaging features remain compatible with adenoma.  Atherosclerotic calcification noted abdominal aorta without aneurysm. Diverticular changes are noted diffusely in the colon without diverticulitis. Uterus surgically absent.  SKELETON  No focal hypermetabolic activity to suggest skeletal metastasis.  IMPRESSION: 15 mm spiculated posterior right upper lobe pulmonary nodule is hypermetabolic, consistent with primary bronchogenic neoplasm. No evidence for metastatic disease in the chest, abdomen, or pelvis.  No evidence for FDG accumulation in the 16 mm right adrenal nodule. This is been stable since 2013, by report, and remains compatible with adenoma.   Electronically Signed   By: Misty Stanley M.D.   On: 09/28/2016 09:34 I personally reviewed the CT and PET CT and concur with the findings noted above.  Impression: Ms. Shankle is a 61 year old former smoker who was found to have a 1.5 cm spiculated nodule in the right upper lobe on a low-dose screening CT. PET/CT shows no evidence of regional or distant metastases. The differential diagnosis includes new primary bronchogenic carcinoma, granulomatous disease, and inflammatory disease. Of these, a new primary bronchogenic  carcinoma is most likely. However she does work around Architect sites and could have been exposed to a fungus or Mycobacterium as well.  We discussed 3 options. One was continued radiographic follow-up. I discouraged that and she has no interest in that. The second option would be to attempt to biopsy bronchoscopically or with a CT-guided biopsy. Either one of those would be subject to a pretty high rate of a false negative due to the relatively small size of the lesion and its location. I would not believe a negative biopsy with either approach. The final option was to proceed with surgical resection for definitive diagnosis and treatment at the same setting.  I described the proposed operation of right VATS, wedge resection and possible right upper lobectomy to Ms. Liwanag and her friend/boss. She understands be done in the operating room under general anesthesia, the incisions to be used, the use of drainage tubes postoperatively, and the expected hospital stay and overall recovery. I informed her the indications, risks, benefits, and alternatives. She understands the risk include, but are not limited to death, MI, DVT, PE, bleeding, possible need for transfusion, infection, prolonged air leak, cardiac arrhythmias, as well as the possibility of other unforeseeable complications.  She accepts the risks and wishes to proceed.  Plan: Pulmonary function testing  Right VATS, wedge resection, possible right upper lobectomy on Monday, 10/15/2016.  [name], MD [facility] [phone]

## 2016-10-04 ENCOUNTER — Encounter (HOSPITAL_COMMUNITY): Payer: Self-pay | Admitting: Psychiatry

## 2016-10-04 ENCOUNTER — Ambulatory Visit (INDEPENDENT_AMBULATORY_CARE_PROVIDER_SITE_OTHER): Payer: Managed Care, Other (non HMO) | Admitting: Psychiatry

## 2016-10-04 VITALS — BP 150/73 | HR 80 | Ht 66.0 in | Wt 247.0 lb

## 2016-10-04 DIAGNOSIS — Z888 Allergy status to other drugs, medicaments and biological substances status: Secondary | ICD-10-CM

## 2016-10-04 DIAGNOSIS — F331 Major depressive disorder, recurrent, moderate: Secondary | ICD-10-CM

## 2016-10-04 DIAGNOSIS — Z79899 Other long term (current) drug therapy: Secondary | ICD-10-CM | POA: Diagnosis not present

## 2016-10-04 MED ORDER — TRAZODONE HCL 100 MG PO TABS: 200.0000 mg | ORAL_TABLET | Freq: Every day | ORAL | 5 refills | Status: DC

## 2016-10-04 MED ORDER — ESCITALOPRAM OXALATE 10 MG PO TABS: 10.0000 mg | ORAL_TABLET | Freq: Every morning | ORAL | 5 refills | Status: DC
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
## 2016-10-11 ENCOUNTER — Encounter (HOSPITAL_COMMUNITY)
Admission: RE | Admit: 2016-10-11 | Discharge: 2016-10-11 | Disposition: A | Discharge status: Home / Self Care | Payer: Managed Care, Other (non HMO) | Source: Ambulatory Visit | Attending: Thoracic Surgery (Cardiothoracic Vascular Surgery) | Admitting: Thoracic Surgery (Cardiothoracic Vascular Surgery)

## 2016-10-11 ENCOUNTER — Encounter (HOSPITAL_COMMUNITY): Payer: Self-pay

## 2016-10-11 DIAGNOSIS — R911 Solitary pulmonary nodule: Secondary | ICD-10-CM

## 2016-10-11 HISTORY — DX: Personal history of colonic polyps: Z86.010

## 2016-10-11 HISTORY — DX: Personal history of colon polyps, unspecified: Z86.0100

## 2016-10-11 HISTORY — DX: Depression, unspecified: F32.A

## 2016-10-11 HISTORY — DX: Nocturia: R35.1

## 2016-10-11 HISTORY — DX: Urgency of urination: R39.15

## 2016-10-11 HISTORY — DX: Insomnia, unspecified: G47.00

## 2016-10-11 HISTORY — DX: Major depressive disorder, single episode, unspecified: F32.9

## 2016-10-11 HISTORY — DX: Frequency of micturition: R35.0

## 2016-10-11 LAB — COMPREHENSIVE METABOLIC PANEL
ALT: 17 U/L (ref 14–54)
AST: 27 U/L (ref 15–41)
Albumin: 3.6 g/dL (ref 3.5–5.0)
Alkaline Phosphatase: 80 U/L (ref 38–126)
Anion gap: 11 (ref 5–15)
BUN: 20 mg/dL (ref 6–20)
CO2: 21 mmol/L — ABNORMAL LOW (ref 22–32)
Calcium: 8.8 mg/dL — ABNORMAL LOW (ref 8.9–10.3)
Chloride: 103 mmol/L (ref 101–111)
Creatinine, Ser: 0.71 mg/dL (ref 0.44–1.00)
GFR calc Af Amer: >60 mL/min (ref >60)
GFR calc non Af Amer: >60 mL/min (ref >60)
Glucose, Bld: 251 mg/dL — ABNORMAL HIGH (ref 65–99)
Potassium: 4.3 mmol/L (ref 3.5–5.1)
Sodium: 135 mmol/L (ref 135–145)
Total Bilirubin: 0.3 mg/dL (ref 0.3–1.2)
Total Protein: 6.5 g/dL (ref 6.5–8.1)

## 2016-10-11 LAB — PROTIME-INR
INR: 1.02
Prothrombin Time: 13.4 seconds (ref 11.4–15.2)

## 2016-10-11 LAB — TYPE AND SCREEN
ABO/RH(D): A NEG
Antibody Screen: NEGATIVE
Unit division: 0
Unit division: 0

## 2016-10-11 LAB — BLOOD GAS, ARTERIAL
Acid-Base Excess: 0.8 mmol/L (ref 0.0–2.0)
Bicarbonate: 24.7 mmol/L (ref 20.0–28.0)
Drawn by: 470591
FIO2: 0.21
O2 Saturation: 96.4 %
Patient temperature: 98.6
pCO2 arterial: 38 mmHg (ref 32.0–48.0)
pH, Arterial: 7.428 (ref 7.350–7.450)
pO2, Arterial: 85 mmHg (ref 83.0–108.0)

## 2016-10-11 LAB — BPAM RBC
Blood Product Expiration Date: 201803222359
Blood Product Expiration Date: 201803222359
Unit Type and Rh: 600
Unit Type and Rh: 600

## 2016-10-11 LAB — GLUCOSE, CAPILLARY: Glucose-Capillary: 277 mg/dL — ABNORMAL HIGH (ref 65–99)

## 2016-10-11 LAB — CBC
HCT: 35.8 % — ABNORMAL LOW (ref 36.0–46.0)
Hemoglobin: 11.2 g/dL — ABNORMAL LOW (ref 12.0–15.0)
MCH: 24 pg — ABNORMAL LOW (ref 26.0–34.0)
MCHC: 31.3 g/dL (ref 30.0–36.0)
MCV: 76.7 fL — ABNORMAL LOW (ref 78.0–100.0)
Platelets: 203 10*3/uL (ref 150–400)
RBC: 4.67 MIL/uL (ref 3.87–5.11)
RDW: 14.7 % (ref 11.5–15.5)
WBC: 9.5 10*3/uL (ref 4.0–10.5)

## 2016-10-11 LAB — APTT: aPTT: 31 seconds (ref 24–36)

## 2016-10-11 LAB — ABO/RH: ABO/RH(D): A NEG

## 2016-10-11 LAB — SURGICAL PCR SCREEN
MRSA, PCR: NEGATIVE
Staphylococcus aureus: POSITIVE — AB

## 2016-10-11 NOTE — Progress Notes (Signed)
Mupirocin Ointment Rx called into [facility] in [place] for positive PCR of Staph. Left message on pt's voicemail informing her of results and need to pick up Rx.

## 2016-10-11 NOTE — Progress Notes (Signed)
Cardiologist denies  Medical Md is Dr.Dibas Koirala  Echo denies  Stress test around 2000,was done d/t being on Synthroid   Heart cath denies  EKG denies in past yr

## 2016-10-11 NOTE — Progress Notes (Signed)
Lab called stating that urine was contaminated. Pt had put the wipe in the cup. Will need new sample DOS.

## 2016-10-11 NOTE — Pre-Procedure Instructions (Signed)
[name]  10/11/2016      [id number] [address] [address]Phone: [phone]: [phone]   Your procedure is scheduled on Mon, Mar 19 @ 7:30 AM  [facility]  Call this number if you have problems the morning of surgery:  [phone]  Remember:  Do not eat food or drink liquids after midnight.  Take these medicines the morning of surgery with A SIP OF WATER Lexapro(Escitalopram) and Pantoprazole(Protonix)            Stop taking your Fish Oil along with any Vitamins or Herbal Medications. No Goody's,BC's,Aleve,Advil,Motrin,or Ibuprofen.        How to Manage Your Diabetes Before and After Surgery  Why is it important to control my blood sugar before and after surgery? . Improving blood sugar levels before and after surgery helps healing and can limit problems. . A way of improving blood sugar control is eating a healthy diet by: o  Eating less sugar and carbohydrates o  Increasing activity/exercise o  Talking with your doctor about reaching your blood sugar goals . High blood sugars (greater than 180 mg/dL) can raise your risk of infections and slow your recovery, so you will need to focus on controlling your diabetes during the weeks before surgery. . Make sure that the doctor who takes care of your diabetes knows about your planned surgery including the date and location.  How do I manage my blood sugar before surgery? . Check your blood sugar at least 4 times a day, starting 2 days before surgery, to make sure that the level is not too high or low. o Check your blood sugar the morning of your surgery when you wake up and every 2 hours until you get to the Short Stay unit. . If your blood sugar is less than 70 mg/dL, you will need to treat for low blood sugar: o Do not take insulin. o Treat a low blood sugar (less than 70 mg/dL) with   cup of clear juice (cranberry or apple), 4 glucose tablets, OR glucose gel. o Recheck blood sugar in 15 minutes after treatment (to make sure it is greater than 70 mg/dL). If your blood sugar is not greater than 70 mg/dL on recheck, call [phone] further instructions. . Report your blood sugar to the short stay nurse when you get to Short Stay.  . If you are admitted to the hospital after surgery: o Your blood sugar will be checked by the staff and you will probably be given insulin after surgery (instead of oral diabetes medicines) to make sure you have good blood sugar levels. o The goal for blood sugar control after surgery is 80-180 mg/dL.              WHAT DO I DO ABOUT MY DIABETES MEDICATION?   .Marland KitchenDo not take oral diabetes medicines (pills) the morning of surgery.   Reviewed and Endorsed by [facility], August 2015   Do not wear jewelry, make-up or nail polish.  Do not wear lotions, powders, perfumes, or deoderant.  Do not shave 48 hours prior to surgery.    Do not bring valuables to the hospital.  [facility] belongings or [facility].  Contacts, dentures or bridgework may not be worn into surgery.  Leave your suitcase in  the car.  After surgery it may be brought to your room.  For patients admitted to the hospital, discharge time will be determined by your treatment team.  Patients discharged the day of surgery will not be allowed to drive home.    [facility] - Preparing for [facility]  Before surgery, you can play an important role.  Because skin is not sterile, your skin needs to be as free of germs as possible.  You can reduce the number of germs on you skin by washing with CHG (chlorahexidine gluconate) soap before surgery.  CHG is an antiseptic cleaner which kills germs and bonds with the skin to continue killing germs even after washing.  Please DO NOT use if you have an allergy to CHG  or antibacterial soaps.  If your skin becomes reddened/irritated stop using the CHG and inform your nurse when you arrive at Short Stay.  Do not shave (including legs and underarms) for at least 48 hours prior to the first CHG shower.  You may shave your face.  Please follow these instructions carefully:   1.  Shower with CHG Soap the night before surgery and the                                morning of Surgery.  2.  If you choose to wash your hair, wash your hair first as usual with your       normal shampoo.  3.  After you shampoo, rinse your hair and body thoroughly to remove the                      Shampoo.  4.  Use CHG as you would any other liquid soap.  You can apply chg directly       to the skin and wash gently with scrungie or a clean washcloth.  5.  Apply the CHG Soap to your body ONLY FROM THE NECK DOWN.        Do not use on open wounds or open sores.  Avoid contact with your eyes,       ears, mouth and genitals (private parts).  Wash genitals (private parts)       with your normal soap.  6.  Wash thoroughly, paying special attention to the area where your surgery        will be performed.  7.  Thoroughly rinse your body with warm water from the neck down.  8.  DO NOT shower/wash with your normal soap after using and rinsing off       the CHG Soap.  9.  Pat yourself dry with a clean towel.            10.  Wear clean pajamas.            11.  Place clean sheets on your bed the night of your first shower and do not        sleep with pets.  Day of Surgery  Do not apply any lotions/deoderants the morning of surgery.  Please wear clean clothes to the hospital/surgery center.    Please read over the following fact sheets that you were given. Pain Booklet, Coughing and Deep Breathing, MRSA Information and Surgical Site Infection Prevention

## 2016-10-11 NOTE — Progress Notes (Signed)
   10/11/16 1430  OBSTRUCTIVE SLEEP APNEA  Have you ever been diagnosed with sleep apnea through a sleep study? No  Do you snore loudly (loud enough to be heard through closed doors)?  1  Do you often feel tired, fatigued, or sleepy during the daytime (such as falling asleep during driving or talking to someone)? 0  Has anyone observed you stop breathing during your sleep? 0  Do you have, or are you being treated for high blood pressure? 1  BMI more than 35 kg/m2? 1  Age > 50 (1-yes) 1  Neck circumference greater than:Female 16 inches or larger, Female 17inches or larger? 1 (71)  Female Gender (Yes=1) 0  Obstructive Sleep Apnea Score 5  Score 5 or greater  Results sent to PCP

## 2016-10-12 ENCOUNTER — Ambulatory Visit (HOSPITAL_COMMUNITY)
Admission: RE | Admit: 2016-10-12 | Discharge: 2016-10-12 | Disposition: A | Discharge status: Home / Self Care | Payer: Managed Care, Other (non HMO) | Source: Ambulatory Visit | Attending: Thoracic Surgery (Cardiothoracic Vascular Surgery) | Admitting: Thoracic Surgery (Cardiothoracic Vascular Surgery)

## 2016-10-12 ENCOUNTER — Encounter (HOSPITAL_COMMUNITY): Payer: Self-pay

## 2016-10-12 DIAGNOSIS — F329 Major depressive disorder, single episode, unspecified: Secondary | ICD-10-CM

## 2016-10-12 DIAGNOSIS — Z87891 Personal history of nicotine dependence: Secondary | ICD-10-CM | POA: Insufficient documentation

## 2016-10-12 DIAGNOSIS — K219 Gastro-esophageal reflux disease without esophagitis: Secondary | ICD-10-CM

## 2016-10-12 DIAGNOSIS — R911 Solitary pulmonary nodule: Secondary | ICD-10-CM

## 2016-10-12 DIAGNOSIS — G8929 Other chronic pain: Secondary | ICD-10-CM | POA: Insufficient documentation

## 2016-10-12 DIAGNOSIS — Z01818 Encounter for other preprocedural examination: Secondary | ICD-10-CM | POA: Insufficient documentation

## 2016-10-12 DIAGNOSIS — Z0183 Encounter for blood typing: Secondary | ICD-10-CM

## 2016-10-12 DIAGNOSIS — G4700 Insomnia, unspecified: Secondary | ICD-10-CM

## 2016-10-12 DIAGNOSIS — E785 Hyperlipidemia, unspecified: Secondary | ICD-10-CM

## 2016-10-12 DIAGNOSIS — E079 Disorder of thyroid, unspecified: Secondary | ICD-10-CM

## 2016-10-12 DIAGNOSIS — Z9641 Presence of insulin pump (external) (internal): Secondary | ICD-10-CM

## 2016-10-12 DIAGNOSIS — Z79899 Other long term (current) drug therapy: Secondary | ICD-10-CM

## 2016-10-12 DIAGNOSIS — E119 Type 2 diabetes mellitus without complications: Secondary | ICD-10-CM | POA: Insufficient documentation

## 2016-10-12 DIAGNOSIS — Z01812 Encounter for preprocedural laboratory examination: Secondary | ICD-10-CM | POA: Insufficient documentation

## 2016-10-12 DIAGNOSIS — I10 Essential (primary) hypertension: Secondary | ICD-10-CM

## 2016-10-12 DIAGNOSIS — Z794 Long term (current) use of insulin: Secondary | ICD-10-CM | POA: Insufficient documentation

## 2016-10-12 LAB — PULMONARY FUNCTION TEST
DL/VA % pred: 87 %
DL/VA: 4.42 ml/min/mmHg/L
DLCO unc % pred: 71 %
DLCO unc: 19.35 ml/min/mmHg
FEF 25-75 Post: 1.35 L/sec
FEF 25-75 Pre: 1.95 L/sec
FEF2575-%Change-Post: -30 %
FEF2575-%Pred-Post: 58 %
FEF2575-%Pred-Pre: 84 %
FEV1-%Change-Post: -9 %
FEV1-%Pred-Post: 68 %
FEV1-%Pred-Pre: 75 %
FEV1-Post: 1.83 L
FEV1-Pre: 2.01 L
FEV1FVC-%Change-Post: -11 %
FEV1FVC-%Pred-Pre: 102 %
FEV6-%Change-Post: 3 %
FEV6-%Pred-Post: 79 %
FEV6-%Pred-Pre: 76 %
FEV6-Post: 2.63 L
FEV6-Pre: 2.55 L
FEV6FVC-%Pred-Post: 104 %
FEV6FVC-%Pred-Pre: 104 %
FVC-%Change-Post: 3 %
FVC-%Pred-Post: 76 %
FVC-%Pred-Pre: 74 %
FVC-Post: 2.63 L
FVC-Pre: 2.55 L
Post FEV1/FVC ratio: 69 %
Post FEV6/FVC ratio: 100 %
Pre FEV1/FVC ratio: 79 %
Pre FEV6/FVC Ratio: 100 %
RV % pred: 104 %
RV: 2.25 L
TLC % pred: 93 %
TLC: 4.99 L

## 2016-10-12 LAB — HEMOGLOBIN A1C
Hgb A1c MFr Bld: 8.6 % — ABNORMAL HIGH (ref 4.8–5.6)
Mean Plasma Glucose: 200 mg/dL

## 2016-10-12 MED ORDER — ALBUTEROL SULFATE (2.5 MG/3ML) 0.083% IN NEBU
2.5000 mg | INHALATION_SOLUTION | Freq: Once | RESPIRATORY_TRACT | Status: AC
  Administered 2016-10-12: 2.5 mg via RESPIRATORY_TRACT

## 2016-10-12 NOTE — Progress Notes (Signed)
Anesthesia Chart Review: Patient is a 61 year old female scheduled for right VATS, wedge resection, possible lobectomy on 10/15/2016 by Dr. Roxan Hockey. Diagnosis: Right upper lobe nodule (hypermetabolic on PET scan). (Patient goes by "[name].")  History includes former smoker, hyperlipidemia, depression, chronic pain, GERD, thyroid disease (not specified, but has been on Synthroid in the past but developed an anaphylactic reaction; normal TSH 07/11/16) insomnia, hypertension, diabetes mellitus type 2, hysterectomy. BMI is consistent with morbid obesity. OSA screening score was 5.   PCP is Dr. Lujean Amel.  Endocrinologist is Dr. Renato Shin.   Meds include alogliptin benzoate, amlodipine, atorvastatin, Lexapro, insulin pump (V-Go 20; Humalog), lisinopril, metformin, fish oil, pantoprazole, trazodone.   Pulse 81   Temp 36.7 C   Resp (P) 18   Ht (P) '5\' 6"'$  (1.676 m)   Wt (P) 249 lb 9.6 oz (113.2 kg)   SpO2 (P) 95%   BMI (P) 40.29 kg/m  BP was not recorded from PAT, although it was elevated at her visit at [facility] on 10[facility]/16 (175/89). BP was 132/82 at 09/18/16 endocrinology visit.  EKG 10/11/16: SR with occasional PVCs. Possible left atrial enlargement. She reported a stress test > 15 years ago.  CT Chest (lung cancer screen) 09/14/16: IMPRESSION: - 15.1 mm spiculated posterior right upper lobe nodule, suspicious for primary bronchogenic neoplasm. Lung-RADS Category 4B, suspicious. Additional imaging evaluation or consultation with pulmonary medicine or thoracic surgery recommended. Discussion at multidisciplinary tumor board is suggested for consideration of sampling, surgical intervention, and/or PET-CT. - Possible mildly prominent right hilar and right paratracheal nodes, indeterminate, poorly evaluated on unenhanced CT. - 1.6 cm right adrenal nodule is unchanged since 2013, compatible with a benign adrenal adenoma.  Preoperative labs noted. Cr 0.71. H/H 11.2/35.8. PT/PTT. Glucose 251.  A1c 8.6, consistent with mean plasma glucose of 200. T&S done. UA needs to be recollected as it was contaminated (patient left wipe in specimen cup). She will need CXR as well on the day of surgery.  If BP and glucose results acceptable on the day of surgery and otherwise no acute changes then I would anticipate that she can proceed as planned.  George Hugh Shands [facility] Short Stay Center/Anesthesiology Phone [phone] 10/12/2016 10:18 AM

## 2016-10-15 ENCOUNTER — Inpatient Hospital Stay (HOSPITAL_COMMUNITY): Payer: Managed Care, Other (non HMO)

## 2016-10-15 ENCOUNTER — Encounter (HOSPITAL_COMMUNITY)
Admission: RE | Disposition: A | Discharge status: Home / Self Care | Payer: Self-pay | Source: Ambulatory Visit | Attending: Thoracic Surgery (Cardiothoracic Vascular Surgery)

## 2016-10-15 ENCOUNTER — Inpatient Hospital Stay (HOSPITAL_COMMUNITY): Payer: Managed Care, Other (non HMO) | Admitting: Certified Registered Nurse Anesthetist

## 2016-10-15 ENCOUNTER — Encounter (HOSPITAL_COMMUNITY): Payer: Self-pay | Admitting: *Deleted

## 2016-10-15 ENCOUNTER — Inpatient Hospital Stay (HOSPITAL_COMMUNITY): Payer: Managed Care, Other (non HMO) | Admitting: Vascular Surgery

## 2016-10-15 ENCOUNTER — Inpatient Hospital Stay (HOSPITAL_COMMUNITY)
Admission: RE | Admit: 2016-10-15 | Discharge: 2016-10-19 | DRG: 164 | Disposition: A | Discharge status: Home / Self Care | Payer: Managed Care, Other (non HMO) | Source: Ambulatory Visit | Attending: Thoracic Surgery (Cardiothoracic Vascular Surgery) | Admitting: Thoracic Surgery (Cardiothoracic Vascular Surgery)

## 2016-10-15 DIAGNOSIS — Z0183 Encounter for blood typing: Secondary | ICD-10-CM | POA: Diagnosis not present

## 2016-10-15 DIAGNOSIS — Z794 Long term (current) use of insulin: Secondary | ICD-10-CM

## 2016-10-15 DIAGNOSIS — Z01812 Encounter for preprocedural laboratory examination: Secondary | ICD-10-CM

## 2016-10-15 DIAGNOSIS — D62 Acute posthemorrhagic anemia: Secondary | ICD-10-CM | POA: Diagnosis not present

## 2016-10-15 DIAGNOSIS — F329 Major depressive disorder, single episode, unspecified: Secondary | ICD-10-CM | POA: Diagnosis present

## 2016-10-15 DIAGNOSIS — Z902 Acquired absence of lung [part of]: Secondary | ICD-10-CM

## 2016-10-15 DIAGNOSIS — Z9641 Presence of insulin pump (external) (internal): Secondary | ICD-10-CM | POA: Diagnosis present

## 2016-10-15 DIAGNOSIS — Z87891 Personal history of nicotine dependence: Secondary | ICD-10-CM | POA: Diagnosis not present

## 2016-10-15 DIAGNOSIS — Z01811 Encounter for preprocedural respiratory examination: Secondary | ICD-10-CM

## 2016-10-15 DIAGNOSIS — R911 Solitary pulmonary nodule: Secondary | ICD-10-CM | POA: Diagnosis not present

## 2016-10-15 DIAGNOSIS — I10 Essential (primary) hypertension: Secondary | ICD-10-CM | POA: Diagnosis present

## 2016-10-15 DIAGNOSIS — K219 Gastro-esophageal reflux disease without esophagitis: Secondary | ICD-10-CM | POA: Diagnosis present

## 2016-10-15 DIAGNOSIS — E119 Type 2 diabetes mellitus without complications: Secondary | ICD-10-CM | POA: Diagnosis present

## 2016-10-15 DIAGNOSIS — Z888 Allergy status to other drugs, medicaments and biological substances status: Secondary | ICD-10-CM

## 2016-10-15 DIAGNOSIS — E785 Hyperlipidemia, unspecified: Secondary | ICD-10-CM | POA: Diagnosis present

## 2016-10-15 DIAGNOSIS — Z01818 Encounter for other preprocedural examination: Secondary | ICD-10-CM

## 2016-10-15 DIAGNOSIS — Z6841 Body Mass Index (BMI) 40.0 and over, adult: Secondary | ICD-10-CM | POA: Diagnosis not present

## 2016-10-15 DIAGNOSIS — Z4682 Encounter for fitting and adjustment of non-vascular catheter: Secondary | ICD-10-CM

## 2016-10-15 DIAGNOSIS — C3411 Malignant neoplasm of upper lobe, right bronchus or lung: Principal | ICD-10-CM | POA: Diagnosis present

## 2016-10-15 HISTORY — PX: VIDEO ASSISTED THORACOSCOPY (VATS)/WEDGE RESECTION: SHX6174

## 2016-10-15 HISTORY — PX: LYMPH NODE DISSECTION: SHX5087

## 2016-10-15 HISTORY — PX: LOBECTOMY: SHX5089

## 2016-10-15 LAB — GLUCOSE, CAPILLARY
Glucose-Capillary: 221 mg/dL — ABNORMAL HIGH (ref 65–99)
Glucose-Capillary: 244 mg/dL — ABNORMAL HIGH (ref 65–99)
Glucose-Capillary: 284 mg/dL — ABNORMAL HIGH (ref 65–99)
Glucose-Capillary: 265 mg/dL — ABNORMAL HIGH (ref 65–99)

## 2016-10-15 SURGERY — VIDEO ASSISTED THORACOSCOPY (VATS)/WEDGE RESECTION
Anesthesia: General | Site: Chest | Laterality: Right

## 2016-10-15 MED ORDER — SUGAMMADEX SODIUM 500 MG/5ML IV SOLN
INTRAVENOUS | Status: AC
  Filled 2016-10-15: qty 5

## 2016-10-15 MED ORDER — SODIUM CHLORIDE 0.9 % IJ SOLN
INTRAMUSCULAR | Status: AC
  Filled 2016-10-15: qty 10

## 2016-10-15 MED ORDER — VECURONIUM BROMIDE 10 MG IV SOLR
INTRAVENOUS | Status: AC
  Filled 2016-10-15: qty 10

## 2016-10-15 MED ORDER — LEVALBUTEROL HCL 0.63 MG/3ML IN NEBU
0.6300 mg | INHALATION_SOLUTION | Freq: Four times a day (QID) | RESPIRATORY_TRACT | Status: DC
  Administered 2016-10-15 – 2016-10-16 (×4): 0.63 mg via RESPIRATORY_TRACT
  Filled 2016-10-15 (×4): qty 3

## 2016-10-15 MED ORDER — ROCURONIUM BROMIDE 100 MG/10ML IV SOLN
INTRAVENOUS | Status: DC | PRN
  Administered 2016-10-15: 50 mg via INTRAVENOUS

## 2016-10-15 MED ORDER — AMLODIPINE BESYLATE 5 MG PO TABS
5.0000 mg | ORAL_TABLET | Freq: Every day | ORAL | Status: DC
  Administered 2016-10-16 – 2016-10-18 (×3): 5 mg via ORAL
  Filled 2016-10-15 (×3): qty 1

## 2016-10-15 MED ORDER — FENTANYL CITRATE (PF) 100 MCG/2ML IJ SOLN
INTRAMUSCULAR | Status: AC
  Filled 2016-10-15: qty 2

## 2016-10-15 MED ORDER — MIDAZOLAM HCL 2 MG/2ML IJ SOLN
INTRAMUSCULAR | Status: AC
  Filled 2016-10-15: qty 2

## 2016-10-15 MED ORDER — ROCURONIUM BROMIDE 50 MG/5ML IV SOSY
PREFILLED_SYRINGE | INTRAVENOUS | Status: AC
  Filled 2016-10-15: qty 5

## 2016-10-15 MED ORDER — BUPIVACAINE HCL (PF) 0.5 % IJ SOLN
INTRAMUSCULAR | Status: DC | PRN
  Administered 2016-10-15: 10 mL

## 2016-10-15 MED ORDER — HEMOSTATIC AGENTS (NO CHARGE) OPTIME
TOPICAL | Status: DC | PRN
Start: 2016-10-15 — End: 2016-10-15
  Administered 2016-10-15: 1 via TOPICAL

## 2016-10-15 MED ORDER — PROPOFOL 10 MG/ML IV BOLUS
INTRAVENOUS | Status: DC | PRN
  Administered 2016-10-15: 160 mg via INTRAVENOUS
  Administered 2016-10-15: 40 mg via INTRAVENOUS
  Administered 2016-10-15: 50 mg via INTRAVENOUS
  Administered 2016-10-15: 100 mg via INTRAVENOUS

## 2016-10-15 MED ORDER — SUGAMMADEX SODIUM 500 MG/5ML IV SOLN
INTRAVENOUS | Status: DC | PRN
  Administered 2016-10-15: 300 mg via INTRAVENOUS

## 2016-10-15 MED ORDER — BUPIVACAINE ON-Q PAIN PUMP (FOR ORDER SET NO CHG)
INJECTION | Status: DC
  Filled 2016-10-15: qty 1

## 2016-10-15 MED ORDER — SODIUM CHLORIDE 0.9% FLUSH: 9.0000 mL | INTRAVENOUS | Status: DC | PRN

## 2016-10-15 MED ORDER — DEXTROSE 5 % IV SOLN
1.5000 g | Freq: Two times a day (BID) | INTRAVENOUS | Status: AC
  Administered 2016-10-15 – 2016-10-16 (×2): 1.5 g via INTRAVENOUS
  Filled 2016-10-15 (×3): qty 1.5

## 2016-10-15 MED ORDER — DEXTROSE-NACL 5-0.45 % IV SOLN
INTRAVENOUS | Status: DC
  Administered 2016-10-15 – 2016-10-16 (×2): via INTRAVENOUS

## 2016-10-15 MED ORDER — NALOXONE HCL 0.4 MG/ML IJ SOLN: 0.4000 mg | INTRAMUSCULAR | Status: DC | PRN

## 2016-10-15 MED ORDER — SODIUM CHLORIDE 0.9 % IV SOLN
30.0000 meq | Freq: Every day | INTRAVENOUS | Status: DC | PRN
  Filled 2016-10-15: qty 15

## 2016-10-15 MED ORDER — LIDOCAINE HCL (CARDIAC) 20 MG/ML IV SOLN
INTRAVENOUS | Status: DC | PRN
  Administered 2016-10-15: 80 mg via INTRAVENOUS

## 2016-10-15 MED ORDER — LACTATED RINGERS IV SOLN
INTRAVENOUS | Status: DC | PRN
  Administered 2016-10-15 (×2): via INTRAVENOUS

## 2016-10-15 MED ORDER — ALOGLIPTIN BENZOATE 25 MG PO TABS: 25.0000 mg | ORAL_TABLET | Freq: Every day | ORAL | Status: DC

## 2016-10-15 MED ORDER — PROPOFOL 10 MG/ML IV BOLUS
INTRAVENOUS | Status: AC
  Filled 2016-10-15: qty 40

## 2016-10-15 MED ORDER — OXYCODONE HCL 5 MG/5ML PO SOLN: 5.0000 mg | Freq: Once | ORAL | Status: DC | PRN

## 2016-10-15 MED ORDER — HYDROMORPHONE HCL 1 MG/ML IJ SOLN
0.2500 mg | INTRAMUSCULAR | Status: DC | PRN
  Administered 2016-10-15 (×2): 0.25 mg via INTRAVENOUS

## 2016-10-15 MED ORDER — PHENYLEPHRINE HCL 10 MG/ML IJ SOLN
INTRAMUSCULAR | Status: DC | PRN
  Administered 2016-10-15: 40 ug via INTRAVENOUS
  Administered 2016-10-15: 80 ug via INTRAVENOUS
  Administered 2016-10-15 (×2): 40 ug via INTRAVENOUS

## 2016-10-15 MED ORDER — SODIUM CHLORIDE 0.9 % IV SOLN: INTRAVENOUS | Status: DC

## 2016-10-15 MED ORDER — BUPIVACAINE HCL (PF) 0.5 % IJ SOLN
INTRAMUSCULAR | Status: AC
  Filled 2016-10-15: qty 10

## 2016-10-15 MED ORDER — INSULIN REGULAR BOLUS VIA INFUSION
0.0000 [IU] | Freq: Three times a day (TID) | INTRAVENOUS | Status: DC
Start: 2016-10-15 — End: 2016-10-15
  Filled 2016-10-15: qty 10

## 2016-10-15 MED ORDER — INSULIN ASPART 100 UNIT/ML ~~LOC~~ SOLN
0.0000 [IU] | SUBCUTANEOUS | Status: DC
  Administered 2016-10-15 (×2): 12 [IU] via SUBCUTANEOUS
  Administered 2016-10-16: 4 [IU] via SUBCUTANEOUS
  Administered 2016-10-16: 8 [IU] via SUBCUTANEOUS

## 2016-10-15 MED ORDER — PANTOPRAZOLE SODIUM 40 MG PO TBEC
40.0000 mg | DELAYED_RELEASE_TABLET | Freq: Every day | ORAL | Status: DC
  Administered 2016-10-16 – 2016-10-19 (×4): 40 mg via ORAL
  Filled 2016-10-15 (×5): qty 1

## 2016-10-15 MED ORDER — TRAMADOL HCL 50 MG PO TABS: 50.0000 mg | ORAL_TABLET | Freq: Four times a day (QID) | ORAL | Status: DC | PRN

## 2016-10-15 MED ORDER — ACETAMINOPHEN 160 MG/5ML PO SOLN: 1000.0000 mg | Freq: Four times a day (QID) | ORAL | Status: DC

## 2016-10-15 MED ORDER — VECURONIUM BROMIDE 10 MG IV SOLR
INTRAVENOUS | Status: DC | PRN
  Administered 2016-10-15: 1 mg via INTRAVENOUS
  Administered 2016-10-15 (×2): 2 mg via INTRAVENOUS
  Administered 2016-10-15: 1 mg via INTRAVENOUS
  Administered 2016-10-15 (×2): 2 mg via INTRAVENOUS
  Administered 2016-10-15: 4 mg via INTRAVENOUS
  Administered 2016-10-15: 1 mg via INTRAVENOUS

## 2016-10-15 MED ORDER — TRAZODONE HCL 100 MG PO TABS
200.0000 mg | ORAL_TABLET | Freq: Every day | ORAL | Status: DC
  Administered 2016-10-16 – 2016-10-18 (×3): 200 mg via ORAL
  Filled 2016-10-15: qty 2
  Filled 2016-10-15 (×2): qty 1

## 2016-10-15 MED ORDER — BUPIVACAINE 0.5 % ON-Q PUMP SINGLE CATH 400 ML
400.0000 mL | INJECTION | Status: DC
  Filled 2016-10-15: qty 400

## 2016-10-15 MED ORDER — SENNOSIDES-DOCUSATE SODIUM 8.6-50 MG PO TABS
1.0000 | ORAL_TABLET | Freq: Every day | ORAL | Status: DC
  Administered 2016-10-15 – 2016-10-18 (×4): 1 via ORAL
  Filled 2016-10-15 (×4): qty 1

## 2016-10-15 MED ORDER — MIDAZOLAM HCL 5 MG/5ML IJ SOLN
INTRAMUSCULAR | Status: DC | PRN
  Administered 2016-10-15: 2 mg via INTRAVENOUS

## 2016-10-15 MED ORDER — FENTANYL 40 MCG/ML IV SOLN
INTRAVENOUS | Status: DC
  Administered 2016-10-15: 1000 ug via INTRAVENOUS
  Administered 2016-10-15: 60 ug via INTRAVENOUS
  Administered 2016-10-15: 120 ug via INTRAVENOUS
  Administered 2016-10-16: 45 ug via INTRAVENOUS
  Administered 2016-10-16: 60 ug via INTRAVENOUS
  Administered 2016-10-16: 90 ug via INTRAVENOUS
  Administered 2016-10-16: 45 ug via INTRAVENOUS
  Administered 2016-10-16: 90 ug via INTRAVENOUS
  Administered 2016-10-16: 75 ug via INTRAVENOUS
  Administered 2016-10-17: 105 ug via INTRAVENOUS
  Administered 2016-10-17: 45 ug via INTRAVENOUS
  Administered 2016-10-17: 75 ug via INTRAVENOUS
  Administered 2016-10-17: 1000 ug via INTRAVENOUS
  Administered 2016-10-17 (×2): 45 ug via INTRAVENOUS
  Administered 2016-10-18: 90 ug via INTRAVENOUS
  Administered 2016-10-18: 45 ug via INTRAVENOUS
  Administered 2016-10-18: 150 ug via INTRAVENOUS
  Filled 2016-10-15 (×2): qty 25

## 2016-10-15 MED ORDER — METFORMIN HCL ER 500 MG PO TB24
500.0000 mg | ORAL_TABLET | Freq: Two times a day (BID) | ORAL | Status: DC
  Administered 2016-10-15 – 2016-10-19 (×8): 500 mg via ORAL
  Filled 2016-10-15 (×8): qty 1

## 2016-10-15 MED ORDER — FENTANYL CITRATE (PF) 100 MCG/2ML IJ SOLN
INTRAMUSCULAR | Status: AC
  Filled 2016-10-15: qty 4

## 2016-10-15 MED ORDER — ONDANSETRON HCL 4 MG/2ML IJ SOLN
INTRAMUSCULAR | Status: AC
  Filled 2016-10-15: qty 2

## 2016-10-15 MED ORDER — ONDANSETRON HCL 4 MG/2ML IJ SOLN: 4.0000 mg | Freq: Four times a day (QID) | INTRAMUSCULAR | Status: DC | PRN

## 2016-10-15 MED ORDER — ONDANSETRON HCL 4 MG/2ML IJ SOLN
INTRAMUSCULAR | Status: DC | PRN
Start: 2016-10-15 — End: 2016-10-15
  Administered 2016-10-15: 4 mg via INTRAVENOUS

## 2016-10-15 MED ORDER — OXYCODONE HCL 5 MG PO TABS: 5.0000 mg | ORAL_TABLET | Freq: Once | ORAL | Status: DC | PRN

## 2016-10-15 MED ORDER — LINAGLIPTIN 5 MG PO TABS
5.0000 mg | ORAL_TABLET | Freq: Every day | ORAL | Status: DC
  Administered 2016-10-16 – 2016-10-19 (×4): 5 mg via ORAL
  Filled 2016-10-15 (×4): qty 1

## 2016-10-15 MED ORDER — SODIUM CHLORIDE 0.9 % IV SOLN
INTRAVENOUS | Status: DC
  Filled 2016-10-15: qty 2.5

## 2016-10-15 MED ORDER — DIPHENHYDRAMINE HCL 12.5 MG/5ML PO ELIX: 12.5000 mg | ORAL_SOLUTION | Freq: Four times a day (QID) | ORAL | Status: DC | PRN

## 2016-10-15 MED ORDER — DEXTROSE 50 % IV SOLN: 25.0000 mL | INTRAVENOUS | Status: DC | PRN

## 2016-10-15 MED ORDER — BISACODYL 5 MG PO TBEC
10.0000 mg | DELAYED_RELEASE_TABLET | Freq: Every day | ORAL | Status: DC
  Administered 2016-10-16 – 2016-10-19 (×3): 10 mg via ORAL
  Filled 2016-10-15 (×3): qty 2

## 2016-10-15 MED ORDER — PHENYLEPHRINE 40 MCG/ML (10ML) SYRINGE FOR IV PUSH (FOR BLOOD PRESSURE SUPPORT)
PREFILLED_SYRINGE | INTRAVENOUS | Status: AC
  Filled 2016-10-15: qty 10

## 2016-10-15 MED ORDER — HYDROMORPHONE HCL 1 MG/ML IJ SOLN
INTRAMUSCULAR | Status: AC
  Administered 2016-10-15: 0.25 mg via INTRAVENOUS
  Filled 2016-10-15: qty 1

## 2016-10-15 MED ORDER — ACETAMINOPHEN 500 MG PO TABS
1000.0000 mg | ORAL_TABLET | Freq: Four times a day (QID) | ORAL | Status: DC
  Administered 2016-10-15 – 2016-10-19 (×13): 1000 mg via ORAL
  Filled 2016-10-15 (×13): qty 2

## 2016-10-15 MED ORDER — DIPHENHYDRAMINE HCL 50 MG/ML IJ SOLN: 12.5000 mg | Freq: Four times a day (QID) | INTRAMUSCULAR | Status: DC | PRN

## 2016-10-15 MED ORDER — ATORVASTATIN CALCIUM 10 MG PO TABS
10.0000 mg | ORAL_TABLET | Freq: Every day | ORAL | Status: DC
  Administered 2016-10-15 – 2016-10-18 (×4): 10 mg via ORAL
  Filled 2016-10-15 (×4): qty 1

## 2016-10-15 MED ORDER — LISINOPRIL 5 MG PO TABS
5.0000 mg | ORAL_TABLET | Freq: Every day | ORAL | Status: DC
  Administered 2016-10-16 – 2016-10-19 (×4): 5 mg via ORAL
  Filled 2016-10-15 (×5): qty 1

## 2016-10-15 MED ORDER — LIDOCAINE 2% (20 MG/ML) 5 ML SYRINGE
INTRAMUSCULAR | Status: AC
  Filled 2016-10-15: qty 5

## 2016-10-15 MED ORDER — BUPIVACAINE 0.5 % ON-Q PUMP SINGLE CATH 400 ML
INJECTION | Status: DC | PRN
  Administered 2016-10-15: 400 mL

## 2016-10-15 MED ORDER — LACTATED RINGERS IV SOLN
INTRAVENOUS | Status: DC | PRN
  Administered 2016-10-15: 08:00:00 via INTRAVENOUS

## 2016-10-15 MED ORDER — OXYCODONE HCL 5 MG PO TABS
5.0000 mg | ORAL_TABLET | ORAL | Status: DC | PRN
  Administered 2016-10-16: 5 mg via ORAL
  Administered 2016-10-17 – 2016-10-19 (×8): 10 mg via ORAL
  Filled 2016-10-15 (×4): qty 2
  Filled 2016-10-15: qty 1
  Filled 2016-10-15 (×4): qty 2

## 2016-10-15 MED ORDER — ALBUMIN HUMAN 5 % IV SOLN
INTRAVENOUS | Status: DC | PRN
  Administered 2016-10-15 (×2): via INTRAVENOUS

## 2016-10-15 MED ORDER — FENTANYL CITRATE (PF) 100 MCG/2ML IJ SOLN
INTRAMUSCULAR | Status: DC | PRN
  Administered 2016-10-15 (×2): 50 ug via INTRAVENOUS
  Administered 2016-10-15: 100 ug via INTRAVENOUS
  Administered 2016-10-15: 50 ug via INTRAVENOUS
  Administered 2016-10-15: 100 ug via INTRAVENOUS
  Administered 2016-10-15 (×3): 50 ug via INTRAVENOUS

## 2016-10-15 MED ORDER — OMEGA-3-ACID ETHYL ESTERS 1 G PO CAPS
1.0000 g | ORAL_CAPSULE | Freq: Every day | ORAL | Status: DC
  Administered 2016-10-16 – 2016-10-18 (×3): 1 g via ORAL
  Filled 2016-10-15 (×3): qty 1

## 2016-10-15 MED ORDER — DEXTROSE 5 % IV SOLN
1.5000 g | INTRAVENOUS | Status: AC
  Administered 2016-10-15: 1.5 g via INTRAVENOUS
  Filled 2016-10-15: qty 1.5

## 2016-10-15 MED ORDER — ESCITALOPRAM OXALATE 10 MG PO TABS
10.0000 mg | ORAL_TABLET | Freq: Every morning | ORAL | Status: DC
  Administered 2016-10-16 – 2016-10-19 (×4): 10 mg via ORAL
  Filled 2016-10-15 (×4): qty 1

## 2016-10-15 MED ORDER — PHENYLEPHRINE HCL 10 MG/ML IJ SOLN
INTRAVENOUS | Status: DC | PRN
  Administered 2016-10-15: 25 ug/min via INTRAVENOUS

## 2016-10-15 SURGICAL SUPPLY — 107 items
APPLICATOR COTTON TIP 6IN STRL (MISCELLANEOUS) ×2 IMPLANT
BENZOIN TINCTURE PRP APPL 2/3 (GAUZE/BANDAGES/DRESSINGS) ×2 IMPLANT
CANISTER SUCT 3000ML PPV (MISCELLANEOUS) ×4 IMPLANT
CATH KIT ON Q 5IN SLV (PAIN MANAGEMENT) IMPLANT
CATH KIT ON-Q SILVERSOAK 5IN (CATHETERS) ×2 IMPLANT
CATH THORACIC 28FR (CATHETERS) ×2 IMPLANT
CATH THORACIC 36FR (CATHETERS) IMPLANT
CATH THORACIC 36FR RT ANG (CATHETERS) IMPLANT
CLIP TI MEDIUM 6 (CLIP) ×2 IMPLANT
CLSR STERI-STRIP ANTIMIC 1/2X4 (GAUZE/BANDAGES/DRESSINGS) ×2 IMPLANT
CONN ST 1/4X3/8  BEN (MISCELLANEOUS)
CONN ST 1/4X3/8 BEN (MISCELLANEOUS) IMPLANT
CONN Y 3/8X3/8X3/8  BEN (MISCELLANEOUS)
CONN Y 3/8X3/8X3/8 BEN (MISCELLANEOUS) IMPLANT
CONT SPEC 4OZ CLIKSEAL STRL BL (MISCELLANEOUS) ×24 IMPLANT
COVER SURGICAL LIGHT HANDLE (MISCELLANEOUS) ×2 IMPLANT
CUTTER ECHEON FLEX ENDO 45 340 (ENDOMECHANICALS) ×2 IMPLANT
DERMABOND ADVANCED (GAUZE/BANDAGES/DRESSINGS) ×1
DERMABOND ADVANCED .7 DNX12 (GAUZE/BANDAGES/DRESSINGS) ×1 IMPLANT
DRAIN CHANNEL 28F RND 3/8 FF (WOUND CARE) IMPLANT
DRAIN CHANNEL 32F RND 10.7 FF (WOUND CARE) IMPLANT
DRAPE LAPAROSCOPIC ABDOMINAL (DRAPES) ×2 IMPLANT
DRAPE WARM FLUID 44X44 (DRAPE) ×2 IMPLANT
ELECT BLADE 6.5 EXT (BLADE) ×2 IMPLANT
ELECT REM PT RETURN 9FT ADLT (ELECTROSURGICAL) ×2
ELECTRODE REM PT RTRN 9FT ADLT (ELECTROSURGICAL) ×1 IMPLANT
GAUZE SPONGE 4X4 12PLY STRL (GAUZE/BANDAGES/DRESSINGS) ×2 IMPLANT
GAUZE SPONGE 4X4 12PLY STRL LF (GAUZE/BANDAGES/DRESSINGS) ×4 IMPLANT
GLOVE BIO SURGEON STRL SZ 6.5 (GLOVE) ×2 IMPLANT
GLOVE BIO SURGEON STRL SZ7 (GLOVE) ×4 IMPLANT
GLOVE BIOGEL M 8.0 STRL (GLOVE) ×4 IMPLANT
GLOVE BIOGEL M STER SZ 6 (GLOVE) ×2 IMPLANT
GLOVE BIOGEL PI IND STRL 6.5 (GLOVE) ×2 IMPLANT
GLOVE BIOGEL PI IND STRL 7.0 (GLOVE) ×2 IMPLANT
GLOVE BIOGEL PI INDICATOR 6.5 (GLOVE) ×2
GLOVE BIOGEL PI INDICATOR 7.0 (GLOVE) ×2
GLOVE ECLIPSE 6.5 STRL STRAW (GLOVE) ×2 IMPLANT
GLOVE SURG SIGNA 7.5 PF LTX (GLOVE) ×4 IMPLANT
GOWN STRL REUS W/ TWL LRG LVL3 (GOWN DISPOSABLE) ×5 IMPLANT
GOWN STRL REUS W/ TWL XL LVL3 (GOWN DISPOSABLE) ×2 IMPLANT
GOWN STRL REUS W/TWL LRG LVL3 (GOWN DISPOSABLE) ×5
GOWN STRL REUS W/TWL XL LVL3 (GOWN DISPOSABLE) ×2
HANDLE STAPLE ENDO GIA SHORT (STAPLE)
HEMOSTAT SURGICEL 2X14 (HEMOSTASIS) IMPLANT
KIT BASIN OR (CUSTOM PROCEDURE TRAY) ×2 IMPLANT
KIT ROOM TURNOVER OR (KITS) ×2 IMPLANT
KIT SUCTION CATH 14FR (SUCTIONS) ×2 IMPLANT
NS IRRIG 1000ML POUR BTL (IV SOLUTION) ×6 IMPLANT
PACK CHEST (CUSTOM PROCEDURE TRAY) ×2 IMPLANT
PAD ARMBOARD 7.5X6 YLW CONV (MISCELLANEOUS) ×4 IMPLANT
POUCH ENDO CATCH II 15MM (MISCELLANEOUS) ×2 IMPLANT
POUCH SPECIMEN RETRIEVAL 10MM (ENDOMECHANICALS) ×2 IMPLANT
RELOAD GREEN ECHELON 45 (STAPLE) ×2 IMPLANT
RELOAD STAPLER GOLD 60MM (STAPLE) ×7 IMPLANT
RELOAD STAPLER GREEN 60MM (STAPLE) ×2 IMPLANT
SCISSORS ENDO CVD 5DCS (MISCELLANEOUS) IMPLANT
SEALANT PROGEL (MISCELLANEOUS) IMPLANT
SEALANT SURG COSEAL 4ML (VASCULAR PRODUCTS) IMPLANT
SEALANT SURG COSEAL 8ML (VASCULAR PRODUCTS) IMPLANT
SHEARS HARMONIC HDI 36CM (ELECTROSURGICAL) ×2 IMPLANT
SOLUTION ANTI FOG 6CC (MISCELLANEOUS) ×2 IMPLANT
SPECIMEN JAR MEDIUM (MISCELLANEOUS) ×2 IMPLANT
SPONGE INTESTINAL PEANUT (DISPOSABLE) ×24 IMPLANT
SPONGE TONSIL 1 RF SGL (DISPOSABLE) ×2 IMPLANT
STAPLE ECHEON FLEX 60 POW ENDO (STAPLE) ×2 IMPLANT
STAPLE RELOAD 2.5MM WHITE (STAPLE) ×14 IMPLANT
STAPLER ENDO GIA 12MM SHORT (STAPLE) IMPLANT
STAPLER RELOAD GOLD 60MM (STAPLE) ×14
STAPLER RELOAD GREEN 60MM (STAPLE) ×4
STAPLER VASCULAR ECHELON 35 (CUTTER) ×2 IMPLANT
STOPCOCK 4 WAY LG BORE MALE ST (IV SETS) ×2 IMPLANT
SUT PROLENE 4 0 RB 1 (SUTURE) ×2
SUT PROLENE 4-0 RB1 .5 CRCL 36 (SUTURE) ×2 IMPLANT
SUT SILK  1 MH (SUTURE) ×2
SUT SILK 1 MH (SUTURE) ×2 IMPLANT
SUT SILK 1 TIES 10X30 (SUTURE) ×2 IMPLANT
SUT SILK 2 0 SH (SUTURE) ×2 IMPLANT
SUT SILK 2 0SH CR/8 30 (SUTURE) ×2 IMPLANT
SUT SILK 3 0 SH 30 (SUTURE) IMPLANT
SUT SILK 3 0SH CR/8 30 (SUTURE) IMPLANT
SUT VIC AB 0 CTX 27 (SUTURE) IMPLANT
SUT VIC AB 1 CTX 27 (SUTURE) IMPLANT
SUT VIC AB 2-0 CT1 27 (SUTURE)
SUT VIC AB 2-0 CT1 TAPERPNT 27 (SUTURE) IMPLANT
SUT VIC AB 2-0 CTX 36 (SUTURE) IMPLANT
SUT VIC AB 3-0 MH 27 (SUTURE) IMPLANT
SUT VIC AB 3-0 SH 27 (SUTURE)
SUT VIC AB 3-0 SH 27X BRD (SUTURE) IMPLANT
SUT VIC AB 3-0 X1 27 (SUTURE) ×2 IMPLANT
SUT VICRYL 0 UR6 27IN ABS (SUTURE) ×4 IMPLANT
SUT VICRYL 2 TP 1 (SUTURE) IMPLANT
SWAB COLLECTION DEVICE MRSA (MISCELLANEOUS) IMPLANT
SYR 10ML LL (SYRINGE) ×2 IMPLANT
SYSTEM SAHARA CHEST DRAIN ATS (WOUND CARE) ×2 IMPLANT
TAPE CLOTH SURG 6X10 WHT LF (GAUZE/BANDAGES/DRESSINGS) ×2 IMPLANT
TIP APPLICATOR SPRAY EXTEND 16 (VASCULAR PRODUCTS) IMPLANT
TOWEL GREEN STERILE (TOWEL DISPOSABLE) ×8 IMPLANT
TOWEL GREEN STERILE FF (TOWEL DISPOSABLE) ×4 IMPLANT
TOWEL OR 17X24 6PK STRL BLUE (TOWEL DISPOSABLE) ×2 IMPLANT
TOWEL OR 17X26 10 PK STRL BLUE (TOWEL DISPOSABLE) ×4 IMPLANT
TRAP SPECIMEN MUCOUS 40CC (MISCELLANEOUS) IMPLANT
TRAY FOLEY W/METER SILVER 16FR (SET/KITS/TRAYS/PACK) ×2 IMPLANT
TROCAR XCEL BLADELESS 5X75MML (TROCAR) ×2 IMPLANT
TROCAR XCEL NON-BLD 5MMX100MML (ENDOMECHANICALS) IMPLANT
TUBE ANAEROBIC SPECIMEN COL (MISCELLANEOUS) IMPLANT
TUNNELER SHEATH ON-Q 11GX8 DSP (PAIN MANAGEMENT) ×2 IMPLANT
WATER STERILE IRR 1000ML POUR (IV SOLUTION) ×4 IMPLANT

## 2016-10-15 NOTE — Anesthesia Procedure Notes (Signed)
Procedure Name: Intubation Date/Time: 10/15/2016 7:50 AM Performed by: Everlean Cherry A Pre-anesthesia Checklist: Patient identified, Emergency Drugs available, Suction available and Patient being monitored Patient Re-evaluated:Patient Re-evaluated prior to inductionOxygen Delivery Method: Circle system utilized Preoxygenation: Pre-oxygenation with 100% oxygen Intubation Type: IV induction Ventilation: Mask ventilation without difficulty Laryngoscope Size: Miller and 2 Grade View: Grade I Endobronchial tube: Left, Double lumen EBT, EBT position confirmed by auscultation and EBT position confirmed by fiberoptic bronchoscope and 37 Fr Number of attempts: 1 Airway Equipment and Method: Stylet and Fiberoptic brochoscope Placement Confirmation: ETT inserted through vocal cords under direct vision,  positive ETCO2 and breath sounds checked- equal and bilateral Secured at: 29 cm Tube secured with: Tape Dental Injury: Teeth and Oropharynx as per pre-operative assessment

## 2016-10-15 NOTE — Progress Notes (Signed)
EVENING ROUNDS NOTE :     [address]       [place] [address]             [phone]                 Day of Surgery Procedure(s) (LRB): RIGHT VIDEO ASSISTED THORACOSCOPY (VATS)/WEDGE RESECTION (Right) RIGHT UPPER LOBECTOMY (Right) LYMPH NODE DISSECTION (Right)  Total Length of Stay:  LOS: 0 days  BP 105/82   Pulse 72   Temp 98.9 F (37.2 C) (Oral)   Resp 17   Ht '5\' 6"'$  (1.676 m)   Wt 250 lb (113.4 kg)   SpO2 92%   BMI 40.35 kg/m   .Intake/Output      03/19 0701 - 03/20 0700   P.O. 620   I.V. (mL/kg) 2883.3 (25.4)   IV Piggyback 600   Total Intake(mL/kg) 4103.3 (36.2)   Urine (mL/kg/hr) 1055 (0.6)   Blood 500 (0.3)   Chest Tube 330 (0.2)   Total Output 1885   Net +2218.3         . sodium chloride Stopped (10/15/16 1404)  . bupivacaine ON-Q pain pump    . dextrose 5 % and 0.45% NaCl 100 mL/hr at 10/15/16 1900     Lab Results  Component Value Date   WBC 9.5 10/11/2016   HGB 11.2 (L) 10/11/2016   HCT 35.8 (L) 10/11/2016   PLT 203 10/11/2016   GLUCOSE 251 (H) 10/11/2016   ALT 17 10/11/2016   AST 27 10/11/2016   NA 135 10/11/2016   K 4.3 10/11/2016   CL 103 10/11/2016   CREATININE 0.71 10/11/2016   BUN 20 10/11/2016   CO2 21 (L) 10/11/2016   TSH 2.93 07/11/2016   INR 1.02 10/11/2016   HGBA1C 8.6 (H) 10/11/2016   Stable after lobectomy  Grace Isaac MD  Beeper [phone] Office [phone] 10/15/2016 11:02 PM

## 2016-10-15 NOTE — Plan of Care (Signed)
Cannot maintain patient's sats on nasal cannula, switched to 50% face mask

## 2016-10-15 NOTE — Brief Op Note (Addendum)
10/15/2016  11:36 AM  PATIENT:  [name]  61 y.o. female  PRE-OPERATIVE DIAGNOSIS:  Right Upper Lobe lung NODULE  POST-OPERATIVE DIAGNOSIS:  Right Upper Lobe lung NODULE  PROCEDURE:  Procedure(s):  RIGHT VIDEO ASSISTED THORACOSCOPY  -Wedge Resection Right Upper Lobe -Thoracoscopic Right Upper Lobectomy -Mediastinal Lymph Node Dissection -On-Q Local Anesthetic catheter placement   SURGEON:  Surgeon(s) and Role:    * [name], MD - Primary  PHYSICIAN ASSISTANT: Ellwood Handler PA-C  ANESTHESIA:   general  EBL:  Total I/O In: 1500 [I.V.:1000; IV Piggyback:500] Out: 8335 [Urine:565; Blood:500]  BLOOD ADMINISTERED:none  DRAINS: 28 Straight Chest Tube   LOCAL MEDICATIONS USED:  MARCAINE     SPECIMEN:  Source of Specimen:  Wedge Resection RUL, Completion of RUL, Lymph Nodes  DISPOSITION OF SPECIMEN:  PATHOLOGY  COUNTS:  YES   PLAN OF CARE: Admit to inpatient   PATIENT DISPOSITION:  PACU - hemodynamically stable.   Delay start of Pharmacological VTE agent (>24hrs) due to surgical blood loss or risk of bleeding: yes  FINDINGS: 1.5 cm nodule at lateral aspect of minor fissure with invagination of the visceral pleura. Frozen showed adenocarcinoma

## 2016-10-15 NOTE — Op Note (Signed)
NAMESLYVIA, LARTIGUE              ACCOUNT NO.:  [id number]  MEDICAL RECORD NO.:  [id number]  LOCATION:  [facility]                         FACILITY:  [facility]  PHYSICIAN:  Revonda Standard. Roxan Hockey, M.D.DATE OF BIRTH:  02/08/1955  DATE OF PROCEDURE:  10/15/2016 DATE OF DISCHARGE:                              OPERATIVE REPORT   PREOPERATIVE DIAGNOSIS:  Right upper lobe nodule.  POSTOPERATIVE DIAGNOSIS:  Adenocarcinoma, right upper lobe.  PROCEDURE: 1. Right video-assisted thoracoscopy. 2. Wedge resection, right upper lobe nodule. 3. Thoracoscopic right upper lobectomy. 4. Mediastinal lymph node dissection. 5. On-Q local anesthetic catheter placement.  SURGEON:  Revonda Standard. Roxan Hockey, M.D.  ASSISTANT:  Ellwood Handler, PA.  ANESTHESIA:  General.  FINDINGS:  A 1.5 cm nodule in the right upper lobe at the lateral aspect of the minor fissure with some adhesions to the middle lobe, small portion of middle lobe taken en bloc with wedge resection to ensure adequate margin.  Frozen section revealed adenocarcinoma.  CLINICAL NOTE:  [name] is a 61 year old woman with a remote history of tobacco abuse, who recently had a low-dose screening CT which showed a 1.5-cm spiculated nodule in the right upper lobe.  This was mildly hypermetabolic with an SUV of 3 on PET-CT.  She was offered several options as to how to proceed with a suggestion of proceeding with right VATS for wedge resection for definitive diagnosis, to be followed by lobectomy if the frozen section was positive.  She wished to be aggressive and have surgery.  The indications, risks, benefits, and alternatives were discussed in detail with her.  She understood and accepted the risks and agreed to proceed.  OPERATIVE NOTE:  [name] was brought to the preoperative holding area on October 15, 2016.  Anesthesia placed an arterial blood pressure monitoring line and established intravenous access.  She was taken to the operating room,  anesthetized, and intubated with a double-lumen endotracheal tube.  Intravenous antibiotics were administered.  A Foley catheter was placed.  Sequential compression devices were placed on the calves for DVT prophylaxis.  She was placed in a left lateral decubitus position and the right chest was prepped and draped in the usual sterile fashion.  Single lung ventilation of the left lung was initiated.  An incision was made in the seventh interspace in the midaxillary line.  A 5 mm port was inserted into the right pleural space.  There was good isolation of the right lung.  A working incision was made in the fourth interspace anterolaterally.  This was 5 cm in length and no rib spreading was performed during the procedure.  The nodule was easily visible on the lateral aspect of the inferior margin of the right upper lobe right at the confluence of the minor fissure.  A wedge resection was performed using an Echelon powered stapler.  Gold cartridges were used.  A 2 cm gross margin was maintained.  There was good hemostasis at the staple lines.  The specimen was placed into an endoscopic retrieval bag, removed and sent for frozen section.  While awaiting the results of the frozen section, a stab incision was made posteriorly and an On-Q local anesthetic catheter was tunneled into a  subpleural location.  It was secured to the skin with a 3-0 silk and primed with 5 mL of 0.5% Marcaine.  Frozen section returned showing adenocarcinoma, and decision was made to proceed with a right upper lobectomy as discussed with the patient preoperatively.  The inferior ligament was divided.  Two level 9 nodes were identified.  All lymph nodes that were encountered during the dissection were removed and sent for permanent pathology as separate specimens.  The pleural reflection was divided at the hilum posteriorly. Moving back anteriorly, the pleural reflection was divided at the hilum anteriorly.  The fissure  then was inspected.  The fissure between the superior segment and the upper lobe was relatively complete.  The minor fissure was incomplete.  The pulmonary artery was identified and dissected out in the major fissure.  The fissure then was completed working anteriorly to posteriorly.  Additional nodes were removed during this portion of the procedure.  The posterior ascending branch of the pulmonary artery was identified.  It was encircled and divided with the endoscopic vascular stapler.  Next, the dissection was carried anteriorly.  Again, the middle lobe vein was identified and preserved. The upper lobe vein branches were encircled and divided with the endoscopic vascular stapler.  The anterior and apical branches to the upper lobe arose from the main pulmonary artery as a common trunk. There were several nodes in this area that were dissected off.  The arteries then were encircled and divided with the endoscopic vascular stapler.  The minor fissure was completed with sequential firings of the Echelon stapler using green cartridges.  The stapler then was placed across the base of the right upper lobe bronchus again with a green cartridge and closed.  A test inflation showed good aeration of the lower and middle lobes.  The stapler was fired transecting the bronchus. The right upper lobe was placed into an endoscopic retrieval bag, removed, and sent for frozen section of the bronchial margin which returned negative.  The azygos vein then was elevated and dissection was begun on the right paratracheal nodes.  During this, there was bleeding from the azygos vein, which was controlled with direct pressure.  The azygos vein was divided with a vascular stapler which controlled the bleeding.  The right paratracheal nodes were removed using a Harmonic Scalpel.  These nodes were enlarged, but otherwise relatively benign in appearance.  The chest was copiously irrigated with warm saline.  A  test inflation to 30 cm of water revealed no leakage from the bronchial stump.  The middle lobe was tacked to the lower lobe using the yellow cartridges for the Echelon stapler.  A 28-French chest tube was placed through one of the port incisions.  The second port incision was closed in standard fashion.  The working incision was closed in 3 layers.  The chest tube was placed to suction.  The patient was placed back in a supine position.  She was extubated in the operating room and taken to postanesthetic care unit in good condition.     Revonda Standard Roxan Hockey, M.D.     SCH/MEDQ  D:  10/15/2016  T:  10/15/2016  Job:  [id number]

## 2016-10-15 NOTE — Anesthesia Preprocedure Evaluation (Addendum)
Anesthesia Evaluation  Patient identified by MRN, date of birth, ID band Patient awake    Reviewed: Allergy & Precautions, H&P , NPO status , Patient's Chart, lab work & pertinent test results  History of Anesthesia Complications Negative for: history of anesthetic complications  Airway Mallampati: I  TM Distance: >3 FB Neck ROM: full    Dental  (+) Teeth Intact, Dental Advidsory Given   Pulmonary former smoker,    breath sounds clear to auscultation       Cardiovascular hypertension, Pt. on medications (-) angina(-) Past MI and (-) CHF  Rhythm:Regular     Neuro/Psych PSYCHIATRIC DISORDERS Depression    GI/Hepatic Neg liver ROS, GERD  Medicated,  Endo/Other  diabetes, Well Controlled, Type 2, Oral Hypoglycemic Agents, Insulin DependentMorbid obesity  Renal/GU negative Renal ROS     Musculoskeletal   Abdominal   Peds  Hematology  (+) anemia ,   Anesthesia Other Findings   Reproductive/Obstetrics                           Anesthesia Physical Anesthesia Plan  ASA: III  Anesthesia Plan: General ETT   Post-op Pain Management:    Induction: Intravenous  Airway Management Planned: Double Lumen EBT  Additional Equipment: Arterial line  Intra-op Plan:   Post-operative Plan: Extubation in OR  Informed Consent: I have reviewed the patients History and Physical, chart, labs and discussed the procedure including the risks, benefits and alternatives for the proposed anesthesia with the patient or authorized representative who has indicated his/her understanding and acceptance.   Dental advisory given  Plan Discussed with: CRNA, Anesthesiologist and Surgeon  Anesthesia Plan Comments:         Anesthesia Quick Evaluation

## 2016-10-15 NOTE — Transfer of Care (Signed)
Immediate Anesthesia Transfer of Care Note  Patient: [name]  Procedure(s) Performed: Procedure(s): RIGHT VIDEO ASSISTED THORACOSCOPY (VATS)/WEDGE RESECTION (Right) RIGHT UPPER LOBECTOMY (Right) LYMPH NODE DISSECTION (Right)  Patient Location: PACU  Anesthesia Type:General  Level of Consciousness: awake, alert , oriented and patient cooperative  Airway & Oxygen Therapy: Patient Spontanous Breathing and Patient connected to nasal cannula oxygen  Post-op Assessment: Report given to RN, Post -op Vital signs reviewed and stable and Patient moving all extremities X 4  Post vital signs: Reviewed and stable  Last Vitals:  Vitals:   10/15/16 0555 10/15/16 1211  BP: 140/69   Pulse: 73   Resp: 16   Temp: 36.7 C (P) 36.8 C    Last Pain:  Vitals:   10/15/16 1211  TempSrc:   PainSc: (P) 6       Patients Stated Pain Goal: 2 (91/22/58 3462)  Complications: No apparent anesthesia complications

## 2016-10-15 NOTE — Interval H&P Note (Signed)
History and Physical Interval Note:  10/15/2016 7:27 AM  [name]  has presented today for surgery, with the diagnosis of RUL NODULE  The various methods of treatment have been discussed with the patient and family. After consideration of risks, benefits and other options for treatment, the patient has consented to  Procedure(s): VIDEO ASSISTED THORACOSCOPY (VATS)/WEDGE RESECTION (Right) POSSIBLE LOBECTOMY (Right) as a surgical intervention .  The patient's history has been reviewed, patient examined, no change in status, stable for surgery.  I have reviewed the patient's chart and labs.  Questions were answered to the patient's satisfaction.     [name]

## 2016-10-15 NOTE — H&P (View-Only) (Signed)
    [address]       [place] [address]             [phone]      PCP is KOIRALA,DIBAS, MD Referring Provider is Koirala, Dibas, MD  Chief Complaint  Patient presents with  . Lung Lesion    RULOBE...detected on [facility] CT CHEST 09/17/16    HPI: [name] is a 61-year-old woman sent for consultation regarding a right upper lobe nodule.  [name] is a 61-year-old woman with a history of tobacco abuse (quit in 2004), insulin-dependent diabetes, hypertension, hyperlipidemia, gastroesophageal reflux, and thyroid disease. She started smoking at about 61 years old and smoked about a pack a day until age 48. She has no history of COPD, asthma or wheezing.  She recently saw Dr. Koirala for an annual visit and asked about a low-dose screening CT. She met criteria and was referred. That was done on 09/14/2016. It showed a 1.5 cm spiculated nodule in the right upper lobe. A PET CT showed the nodule is mildly hypermetabolic with an SUV of 3. There was no hypermetabolic adenopathy or evidence of distant metastases. She did have an adrenal nodule consistent with a benign adenoma.  She works as a construction inspector. She lives by herself. She quit smoking 14 years ago. She denies chest pain, pressure, or tightness. She denies shortness of breath, cough, hemoptysis, and wheezing. She has not had a recent change in appetite or weight loss. She denies any unusual headaches or visual changes.  Zubrod Score: At the time of surgery this patient's most appropriate activity status/level should be described as: [x]    0    Normal activity, no symptoms []    1    Restricted in physical strenuous activity but ambulatory, able to do out light work []    2    Ambulatory and capable of self care, unable to do work activities, up and about >50 % of waking hours                              []    3    Only limited self care, in bed greater than 50% of waking hours []    4    Completely  disabled, no self care, confined to bed or chair []    5    Moribund  Past Medical History:  Diagnosis Date  . Chronic pain   . Diabetes mellitus   . GERD (gastroesophageal reflux disease)   . Hyperlipidemia   . Hypertension   . Thyroid disease     Past Surgical History:  Procedure Laterality Date  . ABDOMINAL HYSTERECTOMY    . ABDOMINAL SURGERY    . COLONOSCOPY  2005 MAC   TICs, IH  . COLONOSCOPY N/A 03/08/2014   Procedure: COLONOSCOPY;  Surgeon: Sandi L Fields, MD;  Location: [facility];  Service: Endoscopy;  Laterality: N/A;  1015  . CYSTOSTOMY  11/22/2011   Procedure: CYSTOSTOMY SUPRAPUBIC;  Surgeon: Mohammad I Javaid, MD;  Location: [facility];  Service: Urology;  Laterality: N/A;  . ESOPHAGOGASTRODUODENOSCOPY N/A 03/08/2014   Procedure: ESOPHAGOGASTRODUODENOSCOPY (EGD);  Surgeon: Sandi L Fields, MD;  Location: [facility];  Service: Endoscopy;  Laterality: N/A;  . HALLUX VALGUS CORRECTION     bilateral foot surgery for bone repairs-multiple  . VAGINA RECONSTRUCTION SURGERY     tvt 2006-       Family History  Problem Relation Age of Onset  . Asthma    . Diabetes    . Arthritis    . Anesthesia problems Neg Hx   . Hypotension Neg Hx   . Malignant hyperthermia Neg Hx   . Pseudochol deficiency Neg Hx   . Colon polyps Neg Hx   . Colon cancer Neg Hx   . Celiac disease Neg Hx   . Pancreatic cancer Neg Hx   . Stomach cancer Neg Hx   . Ulcerative colitis Neg Hx   . Crohn's disease Neg Hx     Social History Social History  Substance Use Topics  . Smoking status: Former Smoker    Packs/day: 1.00    Years: 20.00    Types: Cigarettes    Quit date: 11/19/2001  . Smokeless tobacco: Former Systems developer    Quit date: 10/17/2011  . Alcohol use No     Comment: rarely    Current Outpatient Prescriptions  Medication Sig Dispense Refill  . amLODipine (NORVASC) 5 MG tablet Take 5 mg by mouth daily.    Marland Kitchen atorvastatin (LIPITOR) 10 MG tablet Take 10 mg by mouth daily at 6  PM.     . diphenoxylate-atropine (LOMOTIL) 2.5-0.025 MG tablet Take 1 tablet by mouth 4 (four) times daily as needed for diarrhea or loose stools. 30 tablet 0  . escitalopram (LEXAPRO) 10 MG tablet Take 1 tablet (10 mg total) by mouth every morning. 30 tablet 5  . glucose blood (ONETOUCH VERIO) test strip 1 each by Other route 2 (two) times daily. And lancets 2/day 100 each 12  . Insulin Disposable Pump (V-GO 20) KIT 1 Device by Does not apply route daily. 30 kit 11  . insulin lispro (HUMALOG) 100 UNIT/ML injection For use in pump, total of 60 units per day 20 mL 11  . lisinopril (PRINIVIL,ZESTRIL) 40 MG tablet Take 40 mg by mouth daily.    . metFORMIN (GLUCOPHAGE-XR) 500 MG 24 hr tablet Take 1 tablet (500 mg total) by mouth 2 (two) times daily. 180 tablet 3  . pantoprazole (PROTONIX) 40 MG tablet Take 40 mg by mouth daily.    . traZODone (DESYREL) 100 MG tablet Take 2 tablets (200 mg total) by mouth at bedtime. 60 tablet 5   No current facility-administered medications for this visit.    Facility-Administered Medications Ordered in Other Visits  Medication Dose Route Frequency Provider Last Rate Last Dose  . fludeoxyglucose F - 18 (FDG) injection 85.2 millicurie  77.8 millicurie Intravenous Once PRN Rolm Baptise, MD        Allergies  Allergen Reactions  . Synthroid [Levothyroxine Sodium] Anaphylaxis  . Iodine Hives  . Povidone-Iodine Itching and Rash    Review of Systems  Constitutional: Negative for activity change, appetite change, chills, fever and unexpected weight change.  HENT: Negative for dental problem, trouble swallowing and voice change.   Eyes: Negative for visual disturbance.  Respiratory: Negative for cough, shortness of breath and wheezing.   Cardiovascular: Negative for chest pain.  Gastrointestinal: Negative for abdominal pain and blood in stool.  Endocrine:       Uses insulin pump  Genitourinary: Negative for difficulty urinating and dysuria.  Musculoskeletal:  Positive for arthralgias and back pain. Negative for gait problem.  Neurological: Negative for syncope, weakness and headaches.  Hematological: Negative for adenopathy. Does not bruise/bleed easily.  All other systems reviewed and are negative.   BP (!) 175/89 (BP Location: Left Arm, Patient Position: Sitting, Cuff Size: Large)  Resp 16   Ht 5' 6" (1.676 m)   Wt 250 lb (113.4 kg)   SpO2 95% Comment: ON RA  BMI 40.35 kg/m  Physical Exam  Constitutional: She is oriented to person, place, and time. She appears well-developed and well-nourished. No distress.  Obese  HENT:  Head: Normocephalic and atraumatic.  Mouth/Throat: No oropharyngeal exudate.  Eyes: Conjunctivae and EOM are normal. No scleral icterus.  Neck: Neck supple. No thyromegaly present.  Cardiovascular: Normal rate, regular rhythm, normal heart sounds and intact distal pulses.  Exam reveals no gallop and no friction rub.   No murmur heard. Pulmonary/Chest: Effort normal and breath sounds normal. No respiratory distress. She has no wheezes. She has no rales.  Abdominal: Soft. She exhibits no distension. There is no tenderness.  Musculoskeletal: Normal range of motion. She exhibits no edema.  Lymphadenopathy:    She has no cervical adenopathy.  Neurological: She is alert and oriented to person, place, and time. No cranial nerve deficit.  No focal motor deficit  Skin: Skin is warm and dry.  Vitals reviewed.    Diagnostic Tests: CT CHEST WITHOUT CONTRAST LOW-DOSE FOR LUNG CANCER SCREENING  TECHNIQUE: Multidetector CT imaging of the chest was performed following the standard protocol without IV contrast.  COMPARISON:  Partial comparison to [place] CT abdomen/pelvis dated 06/10/2012  FINDINGS: Cardiovascular: The heart is normal in size. No pericardial effusion.  Mild atherosclerotic calcifications of the aortic arch.  Mediastinum/Nodes: Small mediastinal lymph nodes, including a 10 mm short axis node  in the right paratracheal region (series 2/ image 20).  Bilateral hilar regions are poorly evaluated in the absence of intravenous contrast administration, although a 9 mm right hilar node may be present (series 2/ image 23), equivocal.  Visualized thyroid is unremarkable.  Lungs/Pleura: Evaluation lung parenchyma is mildly constrained by respiratory motion.  15.1 mm (volumetric mean) spiculated nodule in the posterior right upper lobe (series 3/ image 117). Linear pleural tail.  Mild subpleural atelectasis in the lingula.  No focal consolidation.  Mild centrilobular emphysematous changes.  No pleural effusion or pneumothorax.  Upper Abdomen: 1.6 cm low-density right adrenal nodule (series 2/ image 58), unchanged since 2013, likely reflecting a benign adrenal adenoma.  Musculoskeletal: Mild degenerative changes of the visualized thoracolumbar spine.  IMPRESSION: 15.1 mm spiculated posterior right upper lobe nodule, suspicious for primary bronchogenic neoplasm. Lung-RADS Category 4B, suspicious. Additional imaging evaluation or consultation with pulmonary medicine or thoracic surgery recommended. Discussion at multidisciplinary tumor board is suggested for consideration of sampling, surgical intervention, and/or PET-CT.  Possible mildly prominent right hilar and right paratracheal nodes, indeterminate, poorly evaluated on unenhanced CT.  1.6 cm right adrenal nodule is unchanged since 2013, compatible with a benign adrenal adenoma.   Electronically Signed   By: Sriyesh  Krishnan M.D.   On: 09/17/2016 08:29 NUCLEAR MEDICINE PET SKULL BASE TO THIGH  TECHNIQUE: 12.5 mCi F-18 FDG was injected intravenously. Full-ring PET imaging was performed from the skull base to thigh after the radiotracer. CT data was obtained and used for attenuation correction and anatomic localization.  FASTING BLOOD GLUCOSE:  Value: 244 mg/dl  COMPARISON:  CT chest  09/14/2016  FINDINGS: NECK  No hypermetabolic lymph nodes in the neck.  CHEST  The 15 mm spiculated posterior right upper lobe pulmonary nodule is hypermetabolic with SUV max = 3. No evidence for hypermetabolic activity and mediastinal or hilar lymph nodes. No evidence for lymphadenopathy in the mediastinum or right hilum by CT imaging.  Atherosclerotic calcification noted   in the thoracic aorta without aneurysm.  ABDOMEN/PELVIS  No abnormal hypermetabolic activity within the liver, pancreas, adrenal glands, or spleen. No hypermetabolic lymph nodes in the abdomen or pelvis.  16 mm right adrenal nodule, reportedly stable since 2013 shows no evidence for hypermetabolism on PET imaging. Imaging features remain compatible with adenoma.  Atherosclerotic calcification noted abdominal aorta without aneurysm. Diverticular changes are noted diffusely in the colon without diverticulitis. Uterus surgically absent.  SKELETON  No focal hypermetabolic activity to suggest skeletal metastasis.  IMPRESSION: 15 mm spiculated posterior right upper lobe pulmonary nodule is hypermetabolic, consistent with primary bronchogenic neoplasm. No evidence for metastatic disease in the chest, abdomen, or pelvis.  No evidence for FDG accumulation in the 16 mm right adrenal nodule. This is been stable since 2013, by report, and remains compatible with adenoma.   Electronically Signed   By: Eric  Mansell M.D.   On: 09/28/2016 09:34 I personally reviewed the CT and PET CT and concur with the findings noted above.  Impression: [name] is a 61-year-old former smoker who was found to have a 1.5 cm spiculated nodule in the right upper lobe on a low-dose screening CT. PET/CT shows no evidence of regional or distant metastases. The differential diagnosis includes new primary bronchogenic carcinoma, granulomatous disease, and inflammatory disease. Of these, a new primary bronchogenic  carcinoma is most likely. However she does work around construction sites and could have been exposed to a fungus or Mycobacterium as well.  We discussed 3 options. One was continued radiographic follow-up. I discouraged that and she has no interest in that. The second option would be to attempt to biopsy bronchoscopically or with a CT-guided biopsy. Either one of those would be subject to a pretty high rate of a false negative due to the relatively small size of the lesion and its location. I would not believe a negative biopsy with either approach. The final option was to proceed with surgical resection for definitive diagnosis and treatment at the same setting.  I described the proposed operation of right VATS, wedge resection and possible right upper lobectomy to [name] and her friend/boss. She understands be done in the operating room under general anesthesia, the incisions to be used, the use of drainage tubes postoperatively, and the expected hospital stay and overall recovery. I informed her the indications, risks, benefits, and alternatives. She understands the risk include, but are not limited to death, MI, DVT, PE, bleeding, possible need for transfusion, infection, prolonged air leak, cardiac arrhythmias, as well as the possibility of other unforeseeable complications.  She accepts the risks and wishes to proceed.  Plan: Pulmonary function testing  Right VATS, wedge resection, possible right upper lobectomy on Monday, 10/15/2016.  Husna Krone C Geovanie Winnett, MD [facility] [phone] 

## 2016-10-16 ENCOUNTER — Encounter (HOSPITAL_COMMUNITY): Payer: Self-pay | Admitting: Thoracic Surgery (Cardiothoracic Vascular Surgery)

## 2016-10-16 ENCOUNTER — Inpatient Hospital Stay (HOSPITAL_COMMUNITY): Payer: Managed Care, Other (non HMO)

## 2016-10-16 LAB — BASIC METABOLIC PANEL
Anion gap: 11 (ref 5–15)
BUN: 11 mg/dL (ref 6–20)
CO2: 26 mmol/L (ref 22–32)
Calcium: 8.8 mg/dL — ABNORMAL LOW (ref 8.9–10.3)
Chloride: 99 mmol/L — ABNORMAL LOW (ref 101–111)
Creatinine, Ser: 0.72 mg/dL (ref 0.44–1.00)
GFR calc Af Amer: >60 mL/min (ref >60)
GFR calc non Af Amer: >60 mL/min (ref >60)
Glucose, Bld: 190 mg/dL — ABNORMAL HIGH (ref 65–99)
Potassium: 4.2 mmol/L (ref 3.5–5.1)
Sodium: 136 mmol/L (ref 135–145)

## 2016-10-16 LAB — GLUCOSE, CAPILLARY
Glucose-Capillary: 140 mg/dL — ABNORMAL HIGH (ref 65–99)
Glucose-Capillary: 175 mg/dL — ABNORMAL HIGH (ref 65–99)
Glucose-Capillary: 234 mg/dL — ABNORMAL HIGH (ref 65–99)
Glucose-Capillary: 248 mg/dL — ABNORMAL HIGH (ref 65–99)
Glucose-Capillary: 248 mg/dL — ABNORMAL HIGH (ref 65–99)
Glucose-Capillary: 253 mg/dL — ABNORMAL HIGH (ref 65–99)

## 2016-10-16 LAB — CBC
HCT: 31.7 % — ABNORMAL LOW (ref 36.0–46.0)
Hemoglobin: 9.8 g/dL — ABNORMAL LOW (ref 12.0–15.0)
MCH: 24.1 pg — ABNORMAL LOW (ref 26.0–34.0)
MCHC: 30.9 g/dL (ref 30.0–36.0)
MCV: 78.1 fL (ref 78.0–100.0)
Platelets: 211 10*3/uL (ref 150–400)
RBC: 4.06 MIL/uL (ref 3.87–5.11)
RDW: 15.5 % (ref 11.5–15.5)
WBC: 14.2 10*3/uL — ABNORMAL HIGH (ref 4.0–10.5)

## 2016-10-16 LAB — POCT I-STAT 3, ART BLOOD GAS (G3+)
Acid-Base Excess: 1 mmol/L (ref 0.0–2.0)
Bicarbonate: 26.6 mmol/L (ref 20.0–28.0)
O2 Saturation: 95 %
TCO2: 28 mmol/L (ref 0–100)
pCO2 arterial: 45.2 mmHg (ref 32.0–48.0)
pH, Arterial: 7.377 (ref 7.350–7.450)
pO2, Arterial: 75 mmHg — ABNORMAL LOW (ref 83.0–108.0)

## 2016-10-16 MED ORDER — LEVALBUTEROL HCL 0.63 MG/3ML IN NEBU: 0.6300 mg | INHALATION_SOLUTION | Freq: Four times a day (QID) | RESPIRATORY_TRACT | Status: DC | PRN

## 2016-10-16 MED ORDER — MUPIROCIN 2 % EX OINT
1.0000 "application " | TOPICAL_OINTMENT | Freq: Two times a day (BID) | CUTANEOUS | Status: DC
  Administered 2016-10-16 – 2016-10-19 (×7): 1 via NASAL
  Filled 2016-10-16: qty 22

## 2016-10-16 MED ORDER — ENOXAPARIN SODIUM 40 MG/0.4ML ~~LOC~~ SOLN
40.0000 mg | SUBCUTANEOUS | Status: DC
  Administered 2016-10-16 – 2016-10-18 (×3): 40 mg via SUBCUTANEOUS
  Filled 2016-10-16 (×4): qty 0.4

## 2016-10-16 MED ORDER — INSULIN DETEMIR 100 UNIT/ML ~~LOC~~ SOLN
30.0000 [IU] | Freq: Two times a day (BID) | SUBCUTANEOUS | Status: DC
  Administered 2016-10-16 – 2016-10-19 (×7): 30 [IU] via SUBCUTANEOUS
  Filled 2016-10-16 (×8): qty 0.3

## 2016-10-16 MED ORDER — INSULIN ASPART 100 UNIT/ML ~~LOC~~ SOLN
0.0000 [IU] | Freq: Three times a day (TID) | SUBCUTANEOUS | Status: DC
  Administered 2016-10-16: 8 [IU] via SUBCUTANEOUS
  Administered 2016-10-16 (×2): 5 [IU] via SUBCUTANEOUS
  Administered 2016-10-17 (×3): 2 [IU] via SUBCUTANEOUS
  Administered 2016-10-18: 4 [IU] via SUBCUTANEOUS

## 2016-10-16 MED ORDER — CHLORHEXIDINE GLUCONATE CLOTH 2 % EX PADS
6.0000 | MEDICATED_PAD | Freq: Every day | CUTANEOUS | Status: DC
  Administered 2016-10-16 – 2016-10-18 (×3): 6 via TOPICAL

## 2016-10-16 NOTE — Progress Notes (Signed)
1 Day Post-Op Procedure(s) (LRB): RIGHT VIDEO ASSISTED THORACOSCOPY (VATS)/WEDGE RESECTION (Right) RIGHT UPPER LOBECTOMY (Right) LYMPH NODE DISSECTION (Right) Subjective: Feels well, some pain, denies nausea  Objective: Vital signs in last 24 hours: Temp:  [98 F (36.7 C)-99.3 F (37.4 C)] 98.4 F (36.9 C) (03/20 0400) Pulse Rate:  [58-72] 67 (03/20 0700) Cardiac Rhythm: Normal sinus rhythm (03/20 0400) Resp:  [11-22] 18 (03/20 0745) BP: (98-165)/(55-82) 117/57 (03/20 0700) SpO2:  [91 %-98 %] 92 % (03/20 0745) Arterial Line BP: (76-174)/(46-136) 76/68 (03/20 0500) FiO2 (%):  [50 %] 50 % (03/19 1600)  Hemodynamic parameters for last 24 hours:    Intake/Output from previous day: 03/19 0701 - 03/20 0700 In: 5053.3 [P.O.:620; I.V.:3783.3; IV Piggyback:650] Out: 1594 [Urine:1350; Blood:500; Chest Tube:580] Intake/Output this shift: No intake/output data recorded.  General appearance: alert, cooperative and no distress Neurologic: intact Heart: regular rate and rhythm Lungs: diminished breath sounds bibasilar Abdomen: normal findings: soft, non-tender some tidal movement but no air leak  Lab Results:  Recent Labs  10/16/16 0411  WBC 14.2*  HGB 9.8*  HCT 31.7*  PLT 211   BMET:  Recent Labs  10/16/16 0411  NA 136  K 4.2  CL 99*  CO2 26  GLUCOSE 190*  BUN 11  CREATININE 0.72  CALCIUM 8.8*    PT/INR: No results for input(s): LABPROT, INR in the last 72 hours. ABG    Component Value Date/Time   PHART 7.377 10/16/2016 0415   HCO3 26.6 10/16/2016 0415   TCO2 28 10/16/2016 0415   O2SAT 95.0 10/16/2016 0415   CBG (last 3)   Recent Labs  10/15/16 2001 10/16/16 0003 10/16/16 0400  GLUCAP 265* 248* 175*    Assessment/Plan: S/P Procedure(s) (LRB): RIGHT VIDEO ASSISTED THORACOSCOPY (VATS)/WEDGE RESECTION (Right) RIGHT UPPER LOBECTOMY (Right) LYMPH NODE DISSECTION (Right) Plan for transfer to step-down: see transfer orders  CV- stable  RESP- s/p  lobectomy  IS hourly, nebs PRN  Keep CT to suction today  RENAL- creatinine and lytes OK  GI- advance diet  ENDO- CBG markedly elevated last night- insulin drip was never started  Will start levemir, SSI AC and HS  Restart tradjenta and metformin  Anemia secondary to ABL- mild, follow  SCD + enoxaparin for DVT prophylaxis   LOS: 1 day    [name] 10/16/2016

## 2016-10-16 NOTE — Care Management Note (Addendum)
Case Management Note  Patient Details  Name: CHERESA SIERS MRN: [id number] Date of Birth: 02/08/1955  Subjective/Objective:    POD 1 Vats wedge resection, lobectomy, chest tube to suction, will advance diet, she is up ambulating, she lives alone, she states she will be staying with her Fredrich Birks and his wife at discharge and then on the 26th she will be staying with her girlfriend.  She has medication coverage and she has PCP - Dr. Raliegh Ip at [facility].  NCM will cont to follow for dc needs.    3/22 [place], BSN - dc chest tube today, dc pca, transition to po meds, weaing oxygen, NCM will cont to follow patient's progress.               Action/Plan:   Expected Discharge Date:                  Expected Discharge Plan:  Home/Self Care  In-House Referral:     Discharge planning Services  CM Consult  Post Acute Care Choice:    Choice offered to:     DME Arranged:    DME Agency:     HH Arranged:    HH Agency:     Status of Service:  Completed, signed off  If discussed at [employer] of Stay Meetings, dates discussed:    Additional Comments:  Zenon Mayo, RN 10/16/2016, 12:44 PM

## 2016-10-16 NOTE — Progress Notes (Signed)
[facility] EVENING ICU PROGRESS NOTE                   [address]            [place] [address]          [phone]   1 Day Post-Op Procedure(s) (LRB): RIGHT VIDEO ASSISTED THORACOSCOPY (VATS)/WEDGE RESECTION (Right) RIGHT UPPER LOBECTOMY (Right) LYMPH NODE DISSECTION (Right)  Total Length of Stay:  LOS: 1 day   Subjective: Feels good, did some walking earlier  Objective: Vital signs in last 24 hours: Temp:  [98.1 F (36.7 C)-99.1 F (37.3 C)] 98.5 F (36.9 C) (03/20 1935) Pulse Rate:  [56-77] 77 (03/20 2100) Cardiac Rhythm: Normal sinus rhythm (03/20 2000) Resp:  [11-21] 11 (03/20 2100) BP: (105-164)/(55-82) 139/68 (03/20 2000) SpO2:  [89 %-96 %] 94 % (03/20 2100) Arterial Line BP: (76-159)/(67-100) 76/68 (03/20 0500)  Filed Weights   10/15/16 0555  Weight: 113.4 kg (250 lb)    Weight change:    Hemodynamic parameters for last 24 hours:    Intake/Output from previous day: 03/19 0701 - 03/20 0700 In: 5053.3 [P.O.:620; I.V.:3783.3; IV Piggyback:650] Out: 7026 [Urine:1350; Blood:500; Chest Tube:580]  Intake/Output this shift: Total I/O In: 140 [P.O.:120; I.V.:20] Out: 275 [Urine:75; Chest Tube:200]  Current Meds: Scheduled Meds: . acetaminophen  1,000 mg Oral Q6H   Or  . acetaminophen (TYLENOL) oral liquid 160 mg/5 mL  1,000 mg Oral Q6H  . amLODipine  5 mg Oral q1800  . atorvastatin  10 mg Oral q1800  . bisacodyl  10 mg Oral Daily  . Chlorhexidine Gluconate Cloth  6 each Topical Daily  . enoxaparin (LOVENOX) injection  40 mg Subcutaneous Q24H  . escitalopram  10 mg Oral q morning - 10a  . fentaNYL   Intravenous Q4H  . insulin aspart  0-15 Units Subcutaneous TID WC  . insulin detemir  30 Units Subcutaneous BID  . linagliptin  5 mg Oral Daily  . lisinopril  5 mg Oral Daily  . metFORMIN  500 mg Oral BID WC  . mupirocin ointment  1 application Nasal BID  . omega-3 acid ethyl esters  1 g Oral QHS  . pantoprazole  40 mg Oral Daily  .  senna-docusate  1 tablet Oral QHS  . traZODone  200 mg Oral QHS   Continuous Infusions: . sodium chloride 10 mL/hr (10/16/16 2000)  . bupivacaine ON-Q pain pump     PRN Meds:.dextrose, diphenhydrAMINE **OR** diphenhydrAMINE, levalbuterol, naloxone **AND** sodium chloride flush, ondansetron (ZOFRAN) IV, oxyCODONE, potassium chloride (KCL MULTIRUN) 30 mEq in 265 mL IVPB, traMADol   Lab Results: CBC: Recent Labs  10/16/16 0411  WBC 14.2*  HGB 9.8*  HCT 31.7*  PLT 211   BMET:  Recent Labs  10/16/16 0411  NA 136  K 4.2  CL 99*  CO2 26  GLUCOSE 190*  BUN 11  CREATININE 0.72  CALCIUM 8.8*    CMET: Lab Results  Component Value Date   WBC 14.2 (H) 10/16/2016   HGB 9.8 (L) 10/16/2016   HCT 31.7 (L) 10/16/2016   PLT 211 10/16/2016   GLUCOSE 190 (H) 10/16/2016   ALT 17 10/11/2016   AST 27 10/11/2016   NA 136 10/16/2016   K 4.2 10/16/2016   CL 99 (L) 10/16/2016   CREATININE 0.72 10/16/2016   BUN 11 10/16/2016   CO2 26 10/16/2016   TSH 2.93 07/11/2016   INR 1.02 10/11/2016   HGBA1C 8.6 (H) 10/11/2016  PT/INR: No results for input(s): LABPROT, INR in the last 72 hours. Radiology: Dg Chest Port 1 View  Result Date: 10/16/2016 CLINICAL DATA:  Status post right lobectomy EXAM: PORTABLE CHEST 1 VIEW COMPARISON:  10/15/2016 FINDINGS: Cardiac shadow is stable. Postsurgical changes are noted on the right with prominence of the right hilar region. Right chest tube is noted in place but slightly withdrawn when compared with the prior exam. No pneumothorax is seen. Mild left basilar atelectasis is noted new from the prior exam. IMPRESSION: Postsurgical changes on the right.  No pneumothorax is seen. New left basilar atelectasis. Electronically Signed   By: Inez Catalina M.D.   On: 10/16/2016 08:05     Assessment/Plan: S/P Procedure(s) (LRB): RIGHT VIDEO ASSISTED THORACOSCOPY (VATS)/WEDGE RESECTION (Right) RIGHT UPPER LOBECTOMY (Right) LYMPH NODE DISSECTION  (Right)   Stable s/p lobectomy. No drips. Tolerating 4L [place]. H and H 9.8/31.7. HR NSR in the 60s, stable BP.     [name] 10/16/2016 9:56 PM

## 2016-10-16 NOTE — Anesthesia Postprocedure Evaluation (Addendum)
Anesthesia Post Note  Patient: CARIANNA LAGUE  Procedure(s) Performed: Procedure(s) (LRB): RIGHT VIDEO ASSISTED THORACOSCOPY (VATS)/WEDGE RESECTION (Right) RIGHT UPPER LOBECTOMY (Right) LYMPH NODE DISSECTION (Right)  Patient location during evaluation: PACU Anesthesia Type: General Level of consciousness: awake and alert Pain management: pain level controlled Vital Signs Assessment: post-procedure vital signs reviewed and stable Respiratory status: spontaneous breathing, nonlabored ventilation, respiratory function stable and patient connected to nasal cannula oxygen Cardiovascular status: blood pressure returned to baseline and stable Postop Assessment: no signs of nausea or vomiting Anesthetic complications: no       Last Vitals:  Vitals:   10/16/16 1100 10/16/16 1204  BP: 128/67   Pulse: 69   Resp: 20   Temp:  36.7 C    Last Pain:  Vitals:   10/16/16 1204  TempSrc: Oral  PainSc:                  Janielle Mittelstadt

## 2016-10-17 ENCOUNTER — Inpatient Hospital Stay (HOSPITAL_COMMUNITY): Payer: Managed Care, Other (non HMO)

## 2016-10-17 LAB — COMPREHENSIVE METABOLIC PANEL
ALT: 17 U/L (ref 14–54)
AST: 15 U/L (ref 15–41)
Albumin: 3.2 g/dL — ABNORMAL LOW (ref 3.5–5.0)
Alkaline Phosphatase: 66 U/L (ref 38–126)
Anion gap: 6 (ref 5–15)
BUN: 10 mg/dL (ref 6–20)
CO2: 30 mmol/L (ref 22–32)
Calcium: 8.9 mg/dL (ref 8.9–10.3)
Chloride: 102 mmol/L (ref 101–111)
Creatinine, Ser: 0.59 mg/dL (ref 0.44–1.00)
GFR calc Af Amer: >60 mL/min (ref >60)
GFR calc non Af Amer: >60 mL/min (ref >60)
Glucose, Bld: 108 mg/dL — ABNORMAL HIGH (ref 65–99)
Potassium: 4.1 mmol/L (ref 3.5–5.1)
Sodium: 138 mmol/L (ref 135–145)
Total Bilirubin: 0.4 mg/dL (ref 0.3–1.2)
Total Protein: 6.2 g/dL — ABNORMAL LOW (ref 6.5–8.1)

## 2016-10-17 LAB — GLUCOSE, CAPILLARY
Glucose-Capillary: 123 mg/dL — ABNORMAL HIGH (ref 65–99)
Glucose-Capillary: 149 mg/dL — ABNORMAL HIGH (ref 65–99)
Glucose-Capillary: 138 mg/dL — ABNORMAL HIGH (ref 65–99)
Glucose-Capillary: 130 mg/dL — ABNORMAL HIGH (ref 65–99)

## 2016-10-17 LAB — CBC
HCT: 32 % — ABNORMAL LOW (ref 36.0–46.0)
Hemoglobin: 9.7 g/dL — ABNORMAL LOW (ref 12.0–15.0)
MCH: 23.9 pg — ABNORMAL LOW (ref 26.0–34.0)
MCHC: 30.3 g/dL (ref 30.0–36.0)
MCV: 78.8 fL (ref 78.0–100.0)
Platelets: 214 10*3/uL (ref 150–400)
RBC: 4.06 MIL/uL (ref 3.87–5.11)
RDW: 15 % (ref 11.5–15.5)
WBC: 12.8 10*3/uL — ABNORMAL HIGH (ref 4.0–10.5)

## 2016-10-17 MED ORDER — INSULIN ASPART 100 UNIT/ML ~~LOC~~ SOLN
4.0000 [IU] | Freq: Three times a day (TID) | SUBCUTANEOUS | Status: DC
  Administered 2016-10-17 (×2): 4 [IU] via SUBCUTANEOUS

## 2016-10-17 MED ORDER — ORAL CARE MOUTH RINSE: 15.0000 mL | Freq: Two times a day (BID) | OROMUCOSAL | Status: DC

## 2016-10-17 NOTE — Progress Notes (Signed)
Patient ID: [name], female   DOB: 02/08/1955, 61 y.o.   MRN: [id number] EVENING ROUNDS NOTE :     [address]       [place] [address]             [phone]                 2 Days Post-Op Procedure(s) (LRB): RIGHT VIDEO ASSISTED THORACOSCOPY (VATS)/WEDGE RESECTION (Right) RIGHT UPPER LOBECTOMY (Right) LYMPH NODE DISSECTION (Right)  Total Length of Stay:  LOS: 2 days  BP (!) 101/56   Pulse 63   Temp 97.8 F (36.6 C) (Oral)   Resp (!) 21   Ht '5\' 6"'$  (1.676 m)   Wt 250 lb (113.4 kg)   SpO2 97%   BMI 40.35 kg/m   .Intake/Output      03/20 0701 - 03/21 0700 03/21 0701 - 03/22 0700   P.O. 660    I.V. (mL/kg) 210 (1.9) 40 (0.4)   IV Piggyback     Total Intake(mL/kg) 870 (7.7) 40 (0.4)   Urine (mL/kg/hr) 1335 (0.5) 180 (0.1)   Blood     Chest Tube 330 (0.1) 240 (0.2)   Total Output 1665 420   Net -795 -380          . sodium chloride 10 mL/hr at 10/17/16 1100  . bupivacaine ON-Q pain pump       Lab Results  Component Value Date   WBC 12.8 (H) 10/17/2016   HGB 9.7 (L) 10/17/2016   HCT 32.0 (L) 10/17/2016   PLT 214 10/17/2016   GLUCOSE 108 (H) 10/17/2016   ALT 17 10/17/2016   AST 15 10/17/2016   NA 138 10/17/2016   K 4.1 10/17/2016   CL 102 10/17/2016   CREATININE 0.59 10/17/2016   BUN 10 10/17/2016   CO2 30 10/17/2016   TSH 2.93 07/11/2016   INR 1.02 10/11/2016   HGBA1C 8.6 (H) 10/11/2016   Stable, waiting for step down bed No air leak   Grace Isaac MD  Beeper [phone] Office [phone] 10/17/2016 6:03 PM

## 2016-10-17 NOTE — Progress Notes (Signed)
2 Days Post-Op Procedure(s) (LRB): RIGHT VIDEO ASSISTED THORACOSCOPY (VATS)/WEDGE RESECTION (Right) RIGHT UPPER LOBECTOMY (Right) LYMPH NODE DISSECTION (Right) Subjective: Some pain this AM  Objective: Vital signs in last 24 hours: Temp:  [97.3 F (36.3 C)-98.7 F (37.1 C)] 98.7 F (37.1 C) (03/21 0826) Pulse Rate:  [52-77] 56 (03/21 0700) Cardiac Rhythm: Sinus bradycardia (03/21 0400) Resp:  [10-21] 19 (03/21 0842) BP: (104-164)/(59-76) 149/75 (03/21 0600) SpO2:  [89 %-97 %] 91 % (03/21 0842)  Hemodynamic parameters for last 24 hours:    Intake/Output from previous day: 03/20 0701 - 03/21 0700 In: 870 [P.O.:660; I.V.:210] Out: 1665 [Urine:1335; Chest Tube:330] Intake/Output this shift: No intake/output data recorded.  General appearance: alert, cooperative and no distress Neurologic: intact Heart: regular rate and rhythm Lungs: clear to auscultation bilaterally no air leak, serous drainage  Lab Results:  Recent Labs  10/16/16 0411 10/17/16 0330  WBC 14.2* 12.8*  HGB 9.8* 9.7*  HCT 31.7* 32.0*  PLT 211 214   BMET:  Recent Labs  10/16/16 0411 10/17/16 0330  NA 136 138  K 4.2 4.1  CL 99* 102  CO2 26 30  GLUCOSE 190* 108*  BUN 11 10  CREATININE 0.72 0.59  CALCIUM 8.8* 8.9    PT/INR: No results for input(s): LABPROT, INR in the last 72 hours. ABG    Component Value Date/Time   PHART 7.377 10/16/2016 0415   HCO3 26.6 10/16/2016 0415   TCO2 28 10/16/2016 0415   O2SAT 95.0 10/16/2016 0415   CBG (last 3)   Recent Labs  10/16/16 1640 10/16/16 2143 10/17/16 0820  GLUCAP 234* 140* 123*    Assessment/Plan: S/P Procedure(s) (LRB): RIGHT VIDEO ASSISTED THORACOSCOPY (VATS)/WEDGE RESECTION (Right) RIGHT UPPER LOBECTOMY (Right) LYMPH NODE DISSECTION (Right) Plan for transfer to step-down: see transfer orders  Awaiting bed on step down CV stable REAP-- no air leak, CT to water seal, continue IS RENAL- lytes and creatinine OK ENDO- CBG better this  AM, will add meal coberage SCD + enoxaparin when bed available Continue ambulation   LOS: 2 days    [name] 10/17/2016

## 2016-10-18 ENCOUNTER — Inpatient Hospital Stay (HOSPITAL_COMMUNITY): Payer: Managed Care, Other (non HMO)

## 2016-10-18 LAB — GLUCOSE, CAPILLARY
Glucose-Capillary: 112 mg/dL — ABNORMAL HIGH (ref 65–99)
Glucose-Capillary: 64 mg/dL — ABNORMAL LOW (ref 65–99)
Glucose-Capillary: 80 mg/dL (ref 65–99)
Glucose-Capillary: 117 mg/dL — ABNORMAL HIGH (ref 65–99)
Glucose-Capillary: 139 mg/dL — ABNORMAL HIGH (ref 65–99)

## 2016-10-18 NOTE — Plan of Care (Signed)
Wasted 16 cc fentanyl in sink witnessed by [employer]

## 2016-10-18 NOTE — Progress Notes (Signed)
Transferred to 2W 07 via [place] and monitor and O2, placed in bed, SCD's with pt, RN to receive in room.

## 2016-10-18 NOTE — Progress Notes (Signed)
Report to 2 W RN 

## 2016-10-18 NOTE — Plan of Care (Signed)
Hypoglycemic Event  CBG: 65  Treatment: crackers and juice  Symptoms: none   Follow-up CBG: Time:1240 CBG Result:80  Possible Reasons for Event:   Comments/MD notified:  no    [name] P

## 2016-10-18 NOTE — Progress Notes (Signed)
3 Days Post-Op Procedure(s) (LRB): RIGHT VIDEO ASSISTED THORACOSCOPY (VATS)/WEDGE RESECTION (Right) RIGHT UPPER LOBECTOMY (Right) LYMPH NODE DISSECTION (Right) Subjective: Some discomfort from tube  Objective: Vital signs in last 24 hours: Temp:  [97.5 F (36.4 C)-98.6 F (37 C)] 98.1 F (36.7 C) (03/22 0823) Pulse Rate:  [50-80] 59 (03/22 0800) Cardiac Rhythm: Sinus bradycardia (03/22 0754) Resp:  [8-29] 23 (03/22 0800) BP: (101-129)/(54-72) 124/54 (03/22 0800) SpO2:  [86 %-100 %] 97 % (03/22 0800) Weight:  [249 lb 12.5 oz (113.3 kg)] 249 lb 12.5 oz (113.3 kg) (03/22 0500)  Hemodynamic parameters for last 24 hours:    Intake/Output from previous day: 03/21 0701 - 03/22 0700 In: 1380 [P.O.:1140; I.V.:240] Out: 1390 [Urine:1080; Chest Tube:310] Intake/Output this shift: Total I/O In: 250 [P.O.:240; I.V.:10] Out: -   General appearance: alert, cooperative and no distress Neurologic: intact Heart: regular rate and rhythm Lungs: clear to auscultation bilaterally Abdomen: normal findings: soft, non-tender no air leak, serous drainage from tube  Lab Results:  Recent Labs  10/16/16 0411 10/17/16 0330  WBC 14.2* 12.8*  HGB 9.8* 9.7*  HCT 31.7* 32.0*  PLT 211 214   BMET:  Recent Labs  10/16/16 0411 10/17/16 0330  NA 136 138  K 4.2 4.1  CL 99* 102  CO2 26 30  GLUCOSE 190* 108*  BUN 11 10  CREATININE 0.72 0.59  CALCIUM 8.8* 8.9    PT/INR: No results for input(s): LABPROT, INR in the last 72 hours. ABG    Component Value Date/Time   PHART 7.377 10/16/2016 0415   HCO3 26.6 10/16/2016 0415   TCO2 28 10/16/2016 0415   O2SAT 95.0 10/16/2016 0415   CBG (last 3)   Recent Labs  10/17/16 1621 10/17/16 2142 10/18/16 0821  GLUCAP 138* 130* 112*    Assessment/Plan: S/P Procedure(s) (LRB): RIGHT VIDEO ASSISTED THORACOSCOPY (VATS)/WEDGE RESECTION (Right) RIGHT UPPER LOBECTOMY (Right) LYMPH NODE DISSECTION (Right) -  No air leak and drainage from chest  tube dropped off overnight- will dc chest tube Dc PCA- use PO pain meds Continue to wean O2 Continue IS Continue ambulation SCD + enoxaparin for DVT prophylaxis Awaiting bed on stepdown    LOS: 3 days    [name] 10/18/2016

## 2016-10-19 ENCOUNTER — Inpatient Hospital Stay (HOSPITAL_COMMUNITY): Payer: Managed Care, Other (non HMO)

## 2016-10-19 ENCOUNTER — Ambulatory Visit (HOSPITAL_COMMUNITY): Payer: Self-pay | Admitting: Psychiatry

## 2016-10-19 LAB — GLUCOSE, CAPILLARY
Glucose-Capillary: 137 mg/dL — ABNORMAL HIGH (ref 65–99)
Glucose-Capillary: 108 mg/dL — ABNORMAL HIGH (ref 65–99)

## 2016-10-19 MED ORDER — OXYCODONE HCL 5 MG PO TABS: 5.0000 mg | ORAL_TABLET | Freq: Four times a day (QID) | ORAL | 0 refills | Status: DC | PRN

## 2016-10-19 NOTE — Discharge Instructions (Signed)
Video-Assisted Thoracic Surgery, Care After °This sheet gives you information about how to care for yourself after your procedure. Your doctor may also give you more specific instructions. If you have problems or questions, contact your doctor. °What can I expect after the procedure? °After the procedure, it is common to have: °· Some pain and soreness in your chest. °· Pain when you breathe in (inhale) and cough. °· Trouble pooping (constipation). °· Tiredness (fatigue). °· Trouble sleeping. ° °Follow these instructions at home: °Preventing lung infection ( °pneumonia) °· Take deep breaths or do breathing exercises as told by your doctor. °· Cough often. Coughing is important to clear thick spit (phlegm) and open your lungs. °· You can make coughing hurt less if you try supporting (splinting) your chest. Try one of these when you cough: °? Hold a pillow against your chest. °? Place both hands flat on top of your cut. °· Use an incentive spirometer as told by your doctor. This is a tool that measures how well you can fill your lungs with each breath. °· Do lung therapy (pulmonary rehabilitation) as told. °Medicines °· Take over-the-counter or prescription medicines only as told by your doctor. °· If you have pain, take pain-relieving medicine before your pain gets very bad. Doing this will help you breathe and cough more comfortably. °· If you were prescribed an antibiotic medicine, take it as told by your doctor. Do not stop taking the antibiotic even if you start to feel better. °Activity °· Ask your doctor what activities are safe for you. °· Avoid activities that use your chest muscles for 3-4 weeks or longer. °· Do not lift anything that is heavier than 10 lb (4.5 kg), or the limit that your doctor tells you, until he or she says that it is safe. °Cut ( °incision) care °· Follow instructions from your doctor about how to take care of your cut(s) from surgery. Make sure you: °? Wash your hands with soap and  water before you change your bandage (dressing). If you cannot use soap and water, use hand sanitizer. °? Change your bandage as told by your doctor. °? Leave stitches (sutures), skin glue, or skin tape (adhesive) strips in place. They may need to stay in place for 2 weeks or longer. If tape strips get loose and curl up, you may trim the loose edges. Do not remove tape strips completely unless your doctor says it is okay. °· Keep your bandage dry until it has been removed. °· Every day, check the area around your cut(s) for signs of infection. Check for: °? Redness, swelling, or pain. °? Fluid or blood. °? Warmth. °? Pus or a bad smell. °Bathing °· Do not take baths, swim, or use a hot tub until your doctor approves. You may take showers. °· After your bandage is removed, use soap and water to gently wash the your cut(s) from surgery. Do not use anything else to clean your cut(s) unless your doctor tells you to do this. °Driving °· Do not drive until your doctor approves. °· Do not drive or use heavy machinery while taking prescription pain medicine. °Eating and drinking °· Eat a healthy diet as told by your doctor. A healthy diet includes: °? Lots of fresh fruits and vegetables. °? Whole grains. °? Low-fat (lean) proteins. °· Limit foods that are high in fat and processed sugars. These include fried and sweet foods. °· Drink enough fluid to keep your pee (urine) clear or light yellow. °General instructions °·   To prevent or treat trouble pooping while you are taking prescription pain medicine, your doctor may recommend that you: °? Take over-the-counter or prescription medicines. °? Eat foods that have a lot of fiber. These include beans, fresh fruits and vegetables, and whole grains. °· Do not use any products that contain nicotine or tobacco. These include cigarettes and e-cigarettes. If you need help quitting, ask your doctor. °· Avoid being where people are smoking (avoid secondhand smoke). °· Wear compression  stockings as told by your doctor. These stockings help you: °? Not get blood clots in your legs. °? Have less swelling in your legs. °· If you have a chest tube, care for it as told by your doctor. °· Do not travel by airplane during the 2 weeks after your chest tube is removed, or until your doctor says that this is safe. °· Keep all follow-up visits as told by your doctor. This is important. °Contact a doctor if: °· You have redness, swelling, or pain around a cut from surgery. °· You have fluid or blood coming from a cut from surgery. °· Your cut(s) from surgery feel warm to the touch. °· You have pus or a bad smell coming from a cut from surgery. °· You have a fever or chills. °· You feel sick to your stomach (nauseous). °· You throw up (vomit). °· You have pain that does not get better with medicine. °Get help right away if: °· You have chest pain. °· Your heart is fluttering or beating fast. °· You start to have a rash. °· You have shortness of breath. °· You have trouble breathing. °· You are confused. °· You have trouble talking or understanding. °· You feel weak, light-headed, or dizzy. °· You faint. °Summary °· To help prevent lung infection (pneumonia), take deep breaths or do breathing exercises as told by your doctor. °· Cough often. This is important for clearing chest fluid (phlegm). You can make coughing hurt less if you hold a pillow to your chest or put your hands flat on the cut(s) when you cough (do splinting). °· Do not drive until your doctor approves. °· Every day, check your cut(s) for signs of infection. These signs can be redness, swelling, pain, fluid, blood, warmth, pus, or a bad smell. °· Eat a healthy diet. This includes lots of fresh fruits and vegetables, whole grains, and low-fat (lean) proteins. °This information is not intended to replace advice given to you by your health care provider. Make sure you discuss any questions you have with your health care provider. °Document  Released: 11/10/2012 Document Revised: 06/25/2016 Document Reviewed: 06/25/2016 °Elsevier Interactive Patient Education © 2017 [employer] °Lung Cancer °Lung cancer occurs when abnormal cells in the lung grow out of control and form a mass (tumor). There are several types of lung cancer. The two most common types are: °· Non-small cell. In this type of lung cancer, abnormal cells are larger and grow more slowly than those of small cell lung cancer. °· Small cell. In this type of lung cancer, abnormal cells are smaller than those of non-small cell lung cancer. Small cell lung cancer gets worse faster than non-small cell lung cancer. ° °What are the causes? °The leading cause of lung cancer is smoking tobacco. The second leading cause is radon exposure. °What increases the risk? °· Smoking tobacco. °· Exposure to secondhand tobacco smoke. °· Exposure to radon gas. °· Exposure to asbestos. °· Exposure to arsenic in drinking water. °· Air pollution. °·   Family or personal history of lung cancer. °· Lung radiation therapy. °· Being older than 65 years. °What are the signs or symptoms? °In the early stages, symptoms may not be present. As the cancer progresses, symptoms may include: °· A lasting cough, possibly with blood. °· Fatigue. °· Unexplained weight loss. °· Shortness of breath. °· Wheezing. °· Chest pain. °· Loss of appetite. ° °Symptoms of advanced lung cancer include: °· Hoarseness. °· Bone or joint pain. °· Weakness. °· Nail problems. °· Face or arm swelling. °· Paralysis of the face. °· Drooping eyelids. ° °How is this diagnosed? °Lung cancer can be identified with a physical exam and with tests such as: °· A chest X-ray. °· A CT scan. °· Blood tests. °· A biopsy. ° °After a diagnosis is made, you will have more tests to determine the stage of the cancer. The stages of non-small cell lung cancer are: °· Stage 0, also called carcinoma in situ. At this stage, abnormal cells are found in the inner lining of your  lung or lungs. °· Stage I. At this stage, abnormal cells have grown into a tumor that is no larger than 5 cm across. The cancer has entered the deeper lung tissue but has not yet entered the lymph nodes or other parts of the body. °· Stage II. At this stage, the tumor is 7 cm across or smaller and has entered nearby lymph nodes. Or, the tumor is 5 cm across or smaller and has invaded surrounding tissue but is not found in nearby lymph nodes. There may be more than one tumor present. °· Stage III. At this stage, the tumor may be any size. There may be more than one tumor in the lungs. The cancer cells have spread to the lymph nodes and possibly to other organs. °· Stage IV. At this stage, there are tumors in both lungs and the cancer has spread to other areas of the body. ° °The stages of small cell lung cancer are: °· Limited. At this stage, the cancer is found only on one side of the chest. °· Extensive. At this stage, the cancer is in the lungs and in tissues on the other side of the chest. The cancer has spread to other organs or is found in the fluid between the layers of your lungs. ° °How is this treated? °Depending on the type and stage of your lung cancer, you may be treated with: °· Surgery. This is done to remove a tumor. °· Radiation therapy. This treatment destroys cancer cells using X-rays or other types of radiation. °· Chemotherapy. This treatment uses medicines to destroy cancer cells. °· Targeted therapy. This treatment aims to destroy only cancer cells instead of all cells as other therapies do. ° °You may also have a combination of treatments. °Follow these instructions at home: °· Do not use any tobacco products. This includes cigarettes, chewing tobacco, and electronic cigarettes. If you need help quitting, ask your health care provider. °· Take medicines only as directed by your health care provider. °· Eat a healthy diet. Work with a dietitian to make sure you are getting the nutrition you  need. °· Consider joining a support group or seeking counseling to help you cope with the stress of having lung cancer. °· Let your cancer specialist (oncologist) know if you are admitted to the hospital. °· Keep all follow-up visits as directed by your health care provider. This is important. °Contact a health care provider if: °· You lose weight   without trying. °· You have a persistent cough and wheezing. °· You feel short of breath. °· You tire easily. °· You experience bone or joint pain. °· You have difficulty swallowing. °· You feel hoarse or notice your voice changing. °· Your pain medicine is not helping. °Get help right away if: °· You cough up blood. °· You have new breathing problems. °· You develop chest pain. °· You develop swelling in: °? One or both ankles or legs. °? Your face, neck, or arms. °· You are confused. °· You experience paralysis in your face or a drooping eyelid. °This information is not intended to replace advice given to you by your health care provider. Make sure you discuss any questions you have with your health care provider. °Document Released: 10/22/2000 Document Revised: 12/22/2015 Document Reviewed: 11/19/2013 °Elsevier Interactive Patient Education © 2017 [employer] ° °

## 2016-10-19 NOTE — Care Management Note (Signed)
Case Management Note Previous CM note initiated by Zenon Mayo, RN--10/16/2016, 12:44 PM   Patient Details  Name: [name] MRN: [id number] Date of Birth: 02/08/1955  Subjective/Objective:    POD 1 Vats wedge resection, lobectomy, chest tube to suction, will advance diet, she is up ambulating, she lives alone, she states she will be staying with her Fredrich Birks and his wife at discharge and then on the 26th she will be staying with her girlfriend.  She has medication coverage and she has PCP - Dr. Raliegh Ip at [facility].  NCM will cont to follow for dc needs.    3/22 [place], BSN - dc chest tube today, dc pca, transition to po meds, weaing oxygen, NCM will cont to follow patient's progress.               Action/Plan: 10/19/16- pt tx from 2S to 2W on 10/18/16-   Expected Discharge Date:  10/19/16               Expected Discharge Plan:  Home/Self Care  In-House Referral:     Discharge planning Services  CM Consult  Post Acute Care Choice:    Choice offered to:     DME Arranged:    DME Agency:     HH Arranged:    [place] Agency:     Status of Service:  Completed, signed off  If discussed at [employer] of Stay Meetings, dates discussed:    Discharge Disposition: home/self care   Additional Comments:  10/19/16- 1030- Danyah Guastella RN, CM- pt for d/c home today- no CM needs noted for discharge.   Dahlia Client Damar, RN 10/19/2016, 10:29 AM [phone]

## 2016-10-19 NOTE — Progress Notes (Addendum)
[address]       [place] [address]             [phone]      4 Days Post-Op Procedure(s) (LRB): RIGHT VIDEO ASSISTED THORACOSCOPY (VATS)/WEDGE RESECTION (Right) RIGHT UPPER LOBECTOMY (Right) LYMPH NODE DISSECTION (Right) Subjective: Looks and feels well   Objective: Vital signs in last 24 hours: Temp:  [97.4 F (36.3 C)-98.1 F (36.7 C)] 97.9 F (36.6 C) (03/23 0605) Pulse Rate:  [59-72] 72 (03/23 0605) Cardiac Rhythm: Sinus bradycardia (03/22 2130) Resp:  [15-23] 18 (03/23 0605) BP: (104-155)/(52-96) 142/71 (03/23 0605) SpO2:  [87 %-97 %] 95 % (03/23 0605)  Hemodynamic parameters for last 24 hours:    Intake/Output from previous day: 03/22 0701 - 03/23 0700 In: 970 [P.O.:960; I.V.:10] Out: 250 [Urine:250] Intake/Output this shift: No intake/output data recorded.  General appearance: alert, cooperative and no distress Heart: regular rate and rhythm Lungs: clear to auscultation bilaterally Abdomen: benign Extremities: no edema Wound: incis healing well  Lab Results:  Recent Labs  10/17/16 0330  WBC 12.8*  HGB 9.7*  HCT 32.0*  PLT 214   BMET:  Recent Labs  10/17/16 0330  NA 138  K 4.1  CL 102  CO2 30  GLUCOSE 108*  BUN 10  CREATININE 0.59  CALCIUM 8.9    PT/INR: No results for input(s): LABPROT, INR in the last 72 hours. ABG    Component Value Date/Time   PHART 7.377 10/16/2016 0415   HCO3 26.6 10/16/2016 0415   TCO2 28 10/16/2016 0415   O2SAT 95.0 10/16/2016 0415   CBG (last 3)   Recent Labs  10/18/16 1645 10/18/16 2126 10/19/16 0608  GLUCAP 117* 139* 137*    Meds Scheduled Meds: . acetaminophen  1,000 mg Oral Q6H   Or  . acetaminophen (TYLENOL) oral liquid 160 mg/5 mL  1,000 mg Oral Q6H  . amLODipine  5 mg Oral q1800  . atorvastatin  10 mg Oral q1800  . bisacodyl  10 mg Oral Daily  . Chlorhexidine Gluconate Cloth  6 each Topical Daily  . enoxaparin (LOVENOX) injection  40 mg Subcutaneous Q24H    . escitalopram  10 mg Oral q morning - 10a  . insulin aspart  0-15 Units Subcutaneous TID WC  . insulin aspart  4 Units Subcutaneous TID WC  . insulin detemir  30 Units Subcutaneous BID  . linagliptin  5 mg Oral Daily  . lisinopril  5 mg Oral Daily  . metFORMIN  500 mg Oral BID WC  . mupirocin ointment  1 application Nasal BID  . omega-3 acid ethyl esters  1 g Oral QHS  . pantoprazole  40 mg Oral Daily  . senna-docusate  1 tablet Oral QHS  . traZODone  200 mg Oral QHS   Continuous Infusions: . sodium chloride Stopped (10/18/16 0942)   PRN Meds:.dextrose, levalbuterol, ondansetron (ZOFRAN) IV, oxyCODONE, potassium chloride (KCL MULTIRUN) 30 mEq in 265 mL IVPB, traMADol  Xrays Dg Chest Port 1 View  Result Date: 10/18/2016 CLINICAL DATA:  Status post lobectomy.  Chest tube removal. EXAM: PORTABLE CHEST 1 VIEW COMPARISON:  10/18/2016 FINDINGS: Interval removal of right chest tube. No pneumothorax. Postoperative changes on the right. There is volume loss on the right with elevation of the right hemidiaphragm. Right perihilar and right basilar opacities, likely atelectasis or postoperative changes. No confluent opacity on the left. Heart is borderline in size. IMPRESSION: Interval removal of right chest tube. No visible  right pneumothorax. Volume loss on the right with increasing perihilar and right base atelectasis or postoperative changes. Electronically Signed   By: Rolm Baptise M.D.   On: 10/18/2016 12:32   Dg Chest Port 1 View  Result Date: 10/18/2016 CLINICAL DATA:  Follow-up chest tube EXAM: PORTABLE CHEST 1 VIEW COMPARISON:  10/17/2016 FINDINGS: Cardiac shadow is stable. Postsurgical changes are noted on the right. Right-sided chest tube is again identified and stable. No definitive pneumothorax is seen. Improved aeration is noted in the left base. No new focal abnormality is seen. IMPRESSION: Stable right chest tube. Improved aeration in the left base. Electronically Signed   By: Inez Catalina M.D.   On: 10/18/2016 07:17    Assessment/Plan: S/P Procedure(s) (LRB): RIGHT VIDEO ASSISTED THORACOSCOPY (VATS)/WEDGE RESECTION (Right) RIGHT UPPER LOBECTOMY (Right) LYMPH NODE DISSECTION (Right)  1 doing well, appears stable for discharge today   LOS: 4 days    GOLD,WAYNE E 10/19/2016 Patient seen and examined, agree with above  Remo Lipps C. Roxan Hockey, MD [facility] [phone]

## 2016-10-19 NOTE — Discharge Summary (Signed)
Physician Discharge Summary  Patient ID: [name] MRN: [id number] DOB/AGE: 63/13/1956 63 y.o.  Admit date: 10/15/2016 Discharge date: 10/19/2016  Admission Diagnoses: Right lung lesion  Discharge Diagnoses:  Adenocarcinoma right upper lobe. Clinical and pathologic stage IA  Patient Active Problem List   Diagnosis Date Noted   S/P lobectomy of lung 10/15/2016   Hyperlipidemia 10/02/2016   Lung nodule 10/02/2016   Adrenal adenoma, right 10/02/2016   Diabetes ([place]) 06/16/2016   HTN (hypertension) 05/31/2016   Change in bowel habits 03/03/2014   Depression 10/13/2013   Fractured lateral malleolus 08/12/2012   Left knee sprain 08/12/2012    HPI: [name] is a 61 year old woman sent for consultation regarding a right upper lobe nodule.  [name] is a 61 year old woman with a history of tobacco abuse (quit in 2004), insulin-dependent diabetes, hypertension, hyperlipidemia, gastroesophageal reflux, and thyroid disease. She started smoking at about 61 years old and smoked about a pack a day until age 20. She has no history of COPD, asthma or wheezing.  She recently saw Dr. Dorthy Cooler for an annual visit and asked about a low-dose screening CT. She met criteria and was referred. That was done on 09/14/2016. It showed a 1.5 cm spiculated nodule in the right upper lobe. A PET CT showed the nodule is mildly hypermetabolic with an SUV of 3. There was no hypermetabolic adenopathy or evidence of distant metastases. She did have an adrenal nodule consistent with a benign adenoma.  She works as a Recruitment consultant. She lives by herself. She quit smoking 14 years ago. She denies chest pain, pressure, or tightness. She denies shortness of breath, cough, hemoptysis, and wheezing. She has not had a recent change in appetite or weight loss. She denies any unusual headaches or visual changes.  She was admitted for resection  Discharged Condition: good  Hospital Course: The  patient was admitted electively for the procedure and on 10/15/2016 she was taken to the operating room where she underwent resection as described below. Initial pathology was adenocarcinoma and the full pathology report confirmed this as described below. Following the procedure she was taken to the postanesthesia care unit in stable condition.  Postoperative hospital course:   The patient has done quite well. Chest tubes were removed in a routine manner following usual protocols and serial chest x-ray examinations. Oxygen has been weaned and she maintains good saturations on room air. Blood sugars have been under adequate control after initially being somewhat high with medication adjustment. She does have an expected acute blood loss anemia and values have stabilized with most recent hematocrit 32. Incisions are noted to be healing well without evidence of infection. She is tolerating diet. She is tolerating gradually increasing activities using standard protocols and is ambulating quite well in the halls. At time of discharge she is felt to be quite stable.  Consults: None  Significant Diagnostic Studies: routine CXR/Labs  Pathology:  Visit #: [id number].[place] Chart #: Phone:  Fax: CC: REPORT OF SURGICAL PATHOLOGY FINAL DIAGNOSIS Diagnosis 1. Lung, wedge biopsy/resection, Right upper - INVASIVE ADENOCARCINOMA, WELL DIFFERENTIATED, SPANNING 1.6 CM. - THE SURGICAL RESECTION MARGINS ARE NEGATIVE FOR CARCINOMA. - SEE ONCOLOGY TABLE BELOW. 2. Lung, resection (segmental or lobe), Right upper - BENIGN LUNG PARENCHYMA WITH INTRAALVEOLAR HEMORRHAGE. - THERE IS NO EVIDENCE OF CARCINOMA IN 3 OF 3 LYMPH NODES (0/3). - THERE IS NO EVIDENCE OF MALIGNANCY. 3. Lymph node, biopsy, #9 RUL - THERE IS NO EVIDENCE OF CARCINOM IN 1 OF 1 LYMPH NODE (0/1).  4. Lymph node, biopsy, 9 #2 RUL - THERE IS NO EVIDENCE OF CARCINOM IN 1 OF 1 LYMPH NODE (0/1). 5. Lymph node, biopsy, 12 RUL - THERE IS NO EVIDENCE  OF CARCINOM IN 1 OF 1 LYMPH NODE (0/1). 6. Lymph node, biopsy, 12 RUL #2 - THERE IS NO EVIDENCE OF CARCINOM IN 1 OF 1 LYMPH NODE (0/1). 7. Lymph node, biopsy, 12 RUL #3 - THERE IS NO EVIDENCE OF CARCINOM IN 1 OF 1 LYMPH NODE (0/1). 8. Lymph node, biopsy, 11 RUL - THERE IS NO EVIDENCE OF CARCINOM IN 1 OF 1 LYMPH NODE (0/1). 9. Lymph node, biopsy, 10 RUL - THERE IS NO EVIDENCE OF CARCINOM IN 1 OF 1 LYMPH NODE (0/1). 10. Lymph node, biopsy, 11 RUL #2 - THERE IS NO EVIDENCE OF CARCINOM IN 1 OF 1 LYMPH NODE (0/1). 11. Lymph node, biopsy, 4 RUL - THERE IS NO EVIDENCE OF CARCINOM IN 1 OF 1 LYMPH NODE (0/1). - SEE COMMENT. 1 of 4 Duplicate copy FINAL for [name], [name] ([id number]) Microscopic Comment 1. LUNG Specimen, including laterality: Right upper lobe. Procedure: Lobectomy. Specimen integrity (intact/disrupted): Intact with staple lines. Tumor site: Right upper lobe. Tumor focality: Unifocal Maximum tumor size (cm): 1.6 cm (gross measurement). Histologic type: Adenocarcinoma. Grade: Well differentiated. Margins: Negative for carcinoma. Distance to closest margin (cm): At least 2.6 cm to the vascular and bronchial margins of the completion lobectomy specimen (specimen # 2). Visceral pleura invasion: Not identified. Tumor extension: Confined to lung parenchyma. Treatment effect (if treated with neoadjuvant therapy): N/A Lymph -Vascular invasion: Not identified. Lymph nodes: Number examined - At least 12; Number N1 nodes positive - 0; Number N2 nodes positive - 0 TNM code: pT1a, pN0, pMX Ancillary studies: Can be performed upon clinician request. Non-neoplastic lung: No significant findings. (JBK:gt, 10/16/16) 11. The specimen was received as a 4.9 cm aggregate of soft tissue. There may in fact be more than one lymph node present in this tissue, although that is difficult to determine. Nonetheless, metastatic carcinoma is not identified. (JBK:gt, 10/16/16) Enid Cutter  MD Pathologist, Electronic Signature (Case signed 10/16/2016) Intraoperative Diagnosis 1. FROZEN SECTION: RIGHT UPPER LOBE NODULE: ADENOCARCINOMA (JBK) 2. FROZEN SECTION: RIGHT UPPER LOBE: MARGIN NEGATIVE FOR CARCINOMA. (JBK) Specimen Gross and Clinical Information Specimen(s) Obtained: 1. Lung, wedge biopsy/resection, Right upper 2. Lung, resection (segmental or lobe), Right upper 3. Lymph node, biopsy, #9 RUL 4. Lymph node, biopsy, 9 #2 RUL 5. Lymph node, biopsy, 12 RUL 6. Lymph node, biopsy, 12 RUL #2 7. Lymph node, biopsy, 12 RUL #3 8. Lymph node, biopsy, 11 RUL 9. Lymph node, biopsy, 10 RUL 10. Lymph node, biopsy, 11 RUL #2 11. Lymph node, biopsy, 4 RUL 2 of 4 Duplicate copy FINAL for [name], [name] ([id number]) Specimen Clinical Information 1. Right upper lobe lung nodule (tl) Gross 1. The specimen is received fresh for rapid intraoperative consultation and consists of a 10 gram, 6.5 x 3.4 x 2.0 cm wedge of lung parenchyma, with a stapled resection line. The pleural surface is tan pink to purple with mild anthracosis, and there is a 1.2 x 0.8 cm area of puckering noted. The surface overlying the puckered area is inked black, and the specimen is serially sectioned. Sectioning reveals tan pink spongy parenchyma, with a 1.6 x 1.5 x 1.2 cm tan gray, firm, ill defined lesion identified. The lesion abuts the inked, puckered pleural surface and measures approximately 0.6 cm from the stapled resection line. A representative section of the lesion is  submitted for frozen section analysis. Representative sections are submitted in four cassettes. A = frozen section remnant B, C = additional sections of lesion D = grossly uninvolved parenchyma. 2. Specimen: Received fresh for rapid intraoperative consultation labeled right upper lobe Specimen integrity (intact/incised/disrupted): Intact, with multiple staple lines Size, weight: 16.2 x 7.5 x 2.6 cm and 120 grams Pleura: Pink purple,  smooth, with mild anthracosis. Parenchyma: Removing the staple line along the length of the specimen reveals an 11.0 cm area of red brown, hemorrhagic parenchyma at the staple site suggesting previous wedge biopsy site. Sectioning reveals spongy red brown parenchyma, and the lesion is not grossly identified. Margin(s): The hemorrhagic tissue measures approximately 2.5 cm from the bronchial resection margins. The bronchial margins are submitted for frozen section analysis. Hilar vessels: Grossly unremarkable Lymph nodes, level 12: Within the hilum, two tan gray possible lymph nodes are identified measuring 0.3 and 0.4 cm in greatest dimension. Block Summary: Twelve blocks submitted A = bronchial margin, frozen section remnant B = vascular resection margins C = section of hilum closest to hemorrhagic tissue D-I = representative sections of hemorrhagic tissue taken along staple line J = section of hilum with one possible lymph node K = grossly uninvolved parenchyma L = one possible lymph node 3. The specimen is received in saline and consists of a 0.6 x 0.5 x 0.3 cm tan gray, anthracotic possible lymph node. The specimen is entirely submitted in one cassette. 4. The specimen is received fresh and consists of a 0.6 x 0.5 x 0.3 cm tan gray, anthracotic possible lymph node. The specimen is entirely submitted in one cassette. 5. The specimen is received fresh and consists of a 1.0 x 0.9 x 0.3 cm disrupted piece of tan gray, anthracotic soft tissue. The specimen is entirely submitted in one cassette. 6. The specimen is received in saline and consists of a 0.9 x 0.6 x 0.3 cm piece of tan gray, anthracotic soft tissue. The specimen is entirely submitted in one cassette. 7. The specimen is received fresh and consists of a 1.1 x 0.9 x 0.3 cm tan gray, anthracotic possible lymph node. The specimen is entirely submitted in one cassette. 8. The specimen is received in saline and consists of a 1.2 x 0.8 x  0.4 cm tan gray, anthracotic possible lymph node. The specimen is entirely submitted in one cassette. 9. The specimen is received in saline and consists of a 0.6 x 0.5 x 0.2 cm piece of tan gray, anthracotic soft tissue. The specimen is entirely submitted in one cassette. 10. The specimen is received in saline and consists of a 1.1 x 0.6 x 0.3 cm aggregate of tan gray, anthracotic soft tissue. The specimen is entirely submitted in one cassette. 3 of 4 Duplicate copy FINAL for [name], [name] ([id number]) Gross(continued) 11. The specimen is received in saline and consists of a 4.9 x 3.4 x 1.0 cm aggregate of tan yellow adipose tissue and disrupted tan gray, anthracotic soft tissue. The specimen is sectioned, and entirely submitted in nine cassettes. (KL:kh 10-16-16) Report signed out from the following location(s) Technical component and interpretation was performed at [facility], [place], [place] [address]. CLIA #: [id number], 4 of   Treatments: surgery:  PHYSICIAN:  Revonda Standard. Roxan Hockey, M.D.DATE OF BIRTH:  02/08/1955  DATE OF PROCEDURE:  10/15/2016 DATE OF DISCHARGE:  OPERATIVE REPORT   PREOPERATIVE DIAGNOSIS:  Right upper lobe nodule.  POSTOPERATIVE DIAGNOSIS:  Adenocarcinoma, right upper lobe.  PROCEDURE: 1. Right video-assisted thoracoscopy. 2. Wedge resection, right upper lobe nodule. 3. Thoracoscopic right upper lobectomy. 4. Mediastinal lymph node dissection. 5. On-Q local anesthetic catheter placement.  SURGEON:  Revonda Standard. Roxan Hockey, M.D.  ASSISTANT:  Ellwood Handler, PA.  ANESTHESIA:  General.  FINDINGS:  A 1.5 cm nodule in the right upper lobe at the lateral aspect of the minor fissure with some adhesions to the middle lobe, small portion of middle lobe taken en bloc with wedge resection to ensure adequate margin.  Frozen section revealed adenocarcinoma. Discharge Exam: Blood pressure  (!) 142/71, pulse 72, temperature 97.9 F (36.6 C), temperature source Oral, resp. rate 18, height '5\' 6"'  (1.676 m), weight 249 lb 12.5 oz (113.3 kg), SpO2 95 %.   General appearance: alert, cooperative and no distress Heart: regular rate and rhythm Lungs: clear to auscultation bilaterally Abdomen: benign Extremities: no edema Wound: incis healing well   Disposition: 01-Home or Self Care  Discharge Instructions    Discharge patient    Complete by:  As directed    Discharge disposition:  01-Home or Self Care   Discharge patient date:  10/19/2016     Allergies as of 10/19/2016      Reactions   Synthroid [levothyroxine Sodium] Anaphylaxis   Iodine Hives   Povidone-iodine Itching, Rash      Medication List    STOP taking these medications   diphenoxylate-atropine 2.5-0.025 MG tablet Commonly known as:  LOMOTIL   glucose blood test strip Commonly known as:  ONETOUCH VERIO     TAKE these medications   Alogliptin Benzoate 25 MG Tabs Take 25 mg by mouth daily.   amLODipine 5 MG tablet Commonly known as:  NORVASC Take 5 mg by mouth daily at 6 PM.   atorvastatin 10 MG tablet Commonly known as:  LIPITOR Take 10 mg by mouth daily at 6 PM.   CALCIUM 600+D3 PO Take 1 tablet by mouth 2 (two) times a week.   escitalopram 10 MG tablet Commonly known as:  LEXAPRO Take 1 tablet (10 mg total) by mouth every morning.   Fish Oil 600 MG Caps Take 1,200 mg by mouth at bedtime.   insulin lispro 100 UNIT/ML injection Commonly known as:  HUMALOG For use in pump, total of 60 units per day   lisinopril 40 MG tablet Commonly known as:  PRINIVIL,ZESTRIL Take 40 mg by mouth daily.   metFORMIN 500 MG 24 hr tablet Commonly known as:  GLUCOPHAGE-XR Take 1 tablet (500 mg total) by mouth 2 (two) times daily.   oxyCODONE 5 MG immediate release tablet Commonly known as:  Oxy IR/ROXICODONE Take 1-2 tablets (5-10 mg total) by mouth every 6 (six) hours as needed for severe pain.    pantoprazole 40 MG tablet Commonly known as:  PROTONIX Take 40 mg by mouth daily.   traZODone 100 MG tablet Commonly known as:  DESYREL Take 2 tablets (200 mg total) by mouth at bedtime.   V-GO 20 Kit 1 Device by Does not apply route daily. What changed:  additional instructions   YUVAFEM 10 MCG Tabs vaginal tablet Generic drug:  Estradiol Place 10 mcg vaginally See admin instructions. Twice a week as needed for vaginal dryness      Follow-up Information    [name], MD Follow up.   Specialty:  Cardiothoracic Surgery Why:  Appointment to see Dr. Roxan Hockey on  April 10 at 10:30 AM with a chest x-ray from [facility] imaging at 10:00 which is located in the same office complex. Also, suture removal on March 29 at 11 AM. Contact information: [address] [place] [address] [phone]           Signed: John Giovanni 10/19/2016, 10:46 AM

## 2016-10-25 ENCOUNTER — Encounter (INDEPENDENT_AMBULATORY_CARE_PROVIDER_SITE_OTHER): Payer: Self-pay

## 2016-10-25 DIAGNOSIS — Z902 Acquired absence of lung [part of]: Secondary | ICD-10-CM

## 2016-10-25 DIAGNOSIS — Z4802 Encounter for removal of sutures: Secondary | ICD-10-CM

## 2016-10-30 ENCOUNTER — Encounter: Payer: Self-pay | Admitting: Gastroenterology

## 2016-11-02 ENCOUNTER — Other Ambulatory Visit: Payer: Self-pay | Admitting: Thoracic Surgery (Cardiothoracic Vascular Surgery)

## 2016-11-02 DIAGNOSIS — Z902 Acquired absence of lung [part of]: Secondary | ICD-10-CM

## 2016-11-06 ENCOUNTER — Ambulatory Visit
Admission: RE | Admit: 2016-11-06 | Discharge: 2016-11-06 | Disposition: A | Discharge status: Home / Self Care | Payer: 59 | Source: Ambulatory Visit | Attending: Thoracic Surgery (Cardiothoracic Vascular Surgery) | Admitting: Thoracic Surgery (Cardiothoracic Vascular Surgery)

## 2016-11-06 ENCOUNTER — Ambulatory Visit (INDEPENDENT_AMBULATORY_CARE_PROVIDER_SITE_OTHER): Payer: Self-pay | Admitting: Thoracic Surgery (Cardiothoracic Vascular Surgery)

## 2016-11-06 ENCOUNTER — Encounter: Payer: Self-pay | Admitting: Thoracic Surgery (Cardiothoracic Vascular Surgery)

## 2016-11-06 ENCOUNTER — Other Ambulatory Visit: Payer: Self-pay | Admitting: Family Medicine

## 2016-11-06 VITALS — BP 148/85 | HR 86 | Resp 18 | Ht 66.0 in | Wt 249.0 lb

## 2016-11-06 DIAGNOSIS — Z1231 Encounter for screening mammogram for malignant neoplasm of breast: Secondary | ICD-10-CM

## 2016-11-06 DIAGNOSIS — Z902 Acquired absence of lung [part of]: Secondary | ICD-10-CM

## 2016-11-06 DIAGNOSIS — C3411 Malignant neoplasm of upper lobe, right bronchus or lung: Secondary | ICD-10-CM

## 2016-11-06 NOTE — Progress Notes (Signed)
[address]       [place] [address]             [phone]       HPI: [name] returns for a scheduled postoperative follow-up visit  [name] is a 61 year old former smoker who was found to have a right upper lobe nodule on a low-dose screening CT. It was hypermetabolic by PET CT. She underwent a right VATS, wedge resection, and right upper lobectomy for stage IA non-small cell carcinoma on 10/15/2016.  Her postoperative course was uncomplicated and she went home on postoperative day #4.  Since discharge she has continued to do well. She does still have some incisional pain, but it is not severe. She has not taking narcotics. She has not had any problems with her breathing. She has been walking on a regular basis.  Past Medical History:  Diagnosis Date  . Chronic pain   . Depression    takes Lexapro daily  . Diabetes mellitus    takes Metformin daily and has an insulin pump.Fasting blood sugar runs250  . GERD (gastroesophageal reflux disease)    takes Pantoprazole daily  . History of colon polyps    benign  . Hyperlipidemia    takes Atorvastatin daily  . Hypertension    takes Amlodipine and Lisinopril daily  . Insomnia    takes Trazodone nightly  . Nocturia   . Thyroid disease   . Urinary frequency   . Urinary urgency     Current Outpatient Prescriptions  Medication Sig Dispense Refill  . Alogliptin Benzoate 25 MG TABS Take 25 mg by mouth daily.  3  . amLODipine (NORVASC) 5 MG tablet Take 5 mg by mouth daily at 6 PM.     . atorvastatin (LIPITOR) 10 MG tablet Take 10 mg by mouth daily at 6 PM.     . Calcium Carb-Cholecalciferol (CALCIUM 600+D3 PO) Take 1 tablet by mouth 2 (two) times a week.    . escitalopram (LEXAPRO) 10 MG tablet Take 1 tablet (10 mg total) by mouth every morning. 30 tablet 5  . Insulin Disposable Pump (V-GO 20) KIT 1 Device by Does not apply route daily. (Patient taking differently: 1 Device by Does not apply route  daily. humalog) 30 kit 11  . insulin lispro (HUMALOG) 100 UNIT/ML injection For use in pump, total of 60 units per day 20 mL 11  . lisinopril (PRINIVIL,ZESTRIL) 40 MG tablet Take 40 mg by mouth daily.    . metFORMIN (GLUCOPHAGE-XR) 500 MG 24 hr tablet Take 1 tablet (500 mg total) by mouth 2 (two) times daily. 180 tablet 3  . Omega-3 Fatty Acids (FISH OIL) 600 MG CAPS Take 1,200 mg by mouth at bedtime.    . pantoprazole (PROTONIX) 40 MG tablet Take 40 mg by mouth daily.    . traZODone (DESYREL) 100 MG tablet Take 2 tablets (200 mg total) by mouth at bedtime. 60 tablet 5  . YUVAFEM 10 MCG TABS vaginal tablet Place 10 mcg vaginally See admin instructions. Twice a week as needed for vaginal dryness  11  . oxyCODONE (OXY IR/ROXICODONE) 5 MG immediate release tablet Take 1-2 tablets (5-10 mg total) by mouth every 6 (six) hours as needed for severe pain. (Patient not taking: Reported on 11/06/2016) 30 tablet 0   No current facility-administered medications for this visit.     Physical Exam BP (!) 148/85 (BP Location: Right Arm, Patient Position: Sitting, Cuff Size: Large)  Pulse 86   Resp 18   Ht 5' 6" (1.676 m)   Wt 249 lb (112.9 kg)   SpO2 96% Comment: RA  BMI 40.19 kg/m  [name] in no acute distress Alert and oriented 3 no focal deficits Diminished breath sounds right base, otherwise clear Incisions healing well No peripheral edema  Diagnostic Tests: CHEST  2 VIEW  COMPARISON:  10/19/2016  FINDINGS: Cardiomediastinal silhouette is stable. Again noted volume loss and elevation of the right hemidiaphragm post right upper lobectomy. Stable postsurgical changes right hilum. No infiltrate or pulmonary edema. Stable mild pleural scarring right base. Bony thorax is unremarkable.  IMPRESSION: Again noted volume loss and elevation of the right hemidiaphragm post right upper lobectomy. Stable postsurgical changes right hilum. No infiltrate or pulmonary edema. Stable  mild pleural scarring right base.   Electronically Signed   By: Liviu  Pop M.D.   On: 11/06/2016 10:08 I personally reviewed the chest x-ray and concur with the findings noted above  Impression: 61-year-old woman who is about 3 weeks out from a thoracoscopic right upper lobectomy for stage IA non-small cell carcinoma. She is doing extremely well at this time with only mild postoperative discomfort and is not requiring any narcotic pain medication. She has not had any respiratory issues.  She has stage IA disease and will not need adjuvant chemotherapy. She does need a referral to oncology for long-term follow-up. We will get her in for multidisciplinary thoracic oncology clinic to see Dr. Mohamed in the next week or 2.  She may begin driving. Appropriate precautions were discussed. There are no other restrictions on her activities, but she was cautioned to build into new activities gradually to avoid undue discomfort.  She quit smoking about 14 years ago.  Plan: I will see her back in 2 months to check on her progress.  Referral to [facility] to see Dr. Mohamed.  Steven C Hendrickson, MD [facility] [phone]    

## 2016-11-08 ENCOUNTER — Ambulatory Visit
Admission: RE | Admit: 2016-11-08 | Discharge: 2016-11-08 | Disposition: A | Discharge status: Home / Self Care | Payer: 59 | Source: Ambulatory Visit | Attending: Family Medicine | Admitting: Family Medicine

## 2016-11-08 ENCOUNTER — Telehealth: Payer: Self-pay | Admitting: *Deleted

## 2016-11-08 DIAGNOSIS — Z1231 Encounter for screening mammogram for malignant neoplasm of breast: Secondary | ICD-10-CM

## 2016-11-08 DIAGNOSIS — R911 Solitary pulmonary nodule: Secondary | ICD-10-CM

## 2016-11-08 HISTORY — DX: Malignant (primary) neoplasm, unspecified: C80.1

## 2016-11-08 NOTE — Telephone Encounter (Signed)
Oncology Nurse Navigator Documentation  Oncology Nurse Navigator Flowsheets 11/08/2016  Navigator Location [facility]  Referral date to RadOnc/MedOnc 11/06/2016  Navigator Encounter Type Telephone/I received referral on [name].  I called and spoke with her today.  I gave her an appt to be seen on 11/29/16 arrive at 12:30.  She verbalized understanding of appt time and place.   Telephone Outgoing Call  Treatment Phase Post-Tx Follow-up  Barriers/Navigation Needs Coordination of Care  Interventions Coordination of Care  Coordination of Care Appts  Acuity Level 2  Acuity Level 2 Assistance expediting appointments  Time Spent with Patient 15

## 2016-11-16 ENCOUNTER — Ambulatory Visit (INDEPENDENT_AMBULATORY_CARE_PROVIDER_SITE_OTHER): Payer: 59 | Admitting: Endocrinology

## 2016-11-16 ENCOUNTER — Encounter: Payer: Self-pay | Admitting: Endocrinology

## 2016-11-16 VITALS — BP 140/82 | HR 94 | Temp 97.9°F | Ht 66.0 in | Wt 244.0 lb

## 2016-11-16 DIAGNOSIS — E119 Type 2 diabetes mellitus without complications: Secondary | ICD-10-CM

## 2016-11-16 DIAGNOSIS — Z794 Long term (current) use of insulin: Secondary | ICD-10-CM | POA: Diagnosis not present

## 2016-11-16 MED ORDER — INSULIN LISPRO 100 UNIT/ML ~~LOC~~ SOLN: SUBCUTANEOUS | 11 refills | Status: DC

## 2016-11-16 NOTE — Patient Instructions (Addendum)
Please continue the same metformin Please take 1 click with breakfast, 2 with lunch, and 8 with supper.  Please do a blood test in 2-3 weeks, here at the office.   check your blood sugar twice a day.  vary the time of day when you check, between before the 3 meals, and at bedtime.  also check if you have symptoms of your blood sugar being too high or too low.  please keep a record of the readings and bring it to your next appointment here (or you can bring the meter itself).  You can write it on any piece of paper.  please call us sooner if your blood sugar goes below 70, or if you have a lot of readings over 200.   Please come back for a follow-up appointment in 3 months.

## 2016-11-16 NOTE — Progress Notes (Signed)
Subjective:    Patient ID: [name], female    DOB: 02/08/1955, 61 y.o.   MRN: [id number]  HPI Pt returns for f/u of diabetes mellitus: DM type: Insulin-requiring type 2 Dx'ed: 8338 Complications: none Therapy: insulin since soon after dx.  GDM: never (G0) DKA: never Severe hypoglycemia: never Pancreatitis: never Other: she takes multiple daily injections; she stopped victoza, due to lack of effect; she uses V-GO-20; she  Did not tolerat  Interval history: She reduced metformin, due to nausea.  no cbg record, but states when she is on the V-GO device, cbg's are in the low to mid-100's.  It is still highest at hs.  She takes take 1 click with breakfast, 2 with lunch, and 7 with supper.  Pt says the V-Go device is using more than the prescribed insulin, so she has been skipping it some days, due to running out.   Past Medical History:  Diagnosis Date  . Cancer ([place])   . Chronic pain   . Depression    takes Lexapro daily  . Diabetes mellitus    takes Metformin daily and has an insulin pump.Fasting blood sugar runs250  . GERD (gastroesophageal reflux disease)    takes Pantoprazole daily  . History of colon polyps    benign  . Hyperlipidemia    takes Atorvastatin daily  . Hypertension    takes Amlodipine and Lisinopril daily  . Insomnia    takes Trazodone nightly  . Nocturia   . Thyroid disease   . Urinary frequency   . Urinary urgency     Past Surgical History:  Procedure Laterality Date  . ABDOMINAL HYSTERECTOMY    . ABDOMINAL SURGERY    . COLONOSCOPY  2005 MAC   TICs, IH  . COLONOSCOPY N/A 03/08/2014   Procedure: COLONOSCOPY;  Surgeon: Danie Binder, MD;  [facility];  Service: Endoscopy;  Laterality: N/A;  1015  . CYSTOSTOMY  11/22/2011   Procedure: CYSTOSTOMY SUPRAPUBIC;  Surgeon: Marissa Nestle, MD;  [facility];  Service: Urology;  Laterality: N/A;  . ESOPHAGOGASTRODUODENOSCOPY N/A 03/08/2014   Procedure: ESOPHAGOGASTRODUODENOSCOPY  (EGD);  Surgeon: Danie Binder, MD;  [facility];  Service: Endoscopy;  Laterality: N/A;  . ESOPHAGOGASTRODUODENOSCOPY    . HALLUX VALGUS CORRECTION     bilateral foot surgery for bone repairs-multiple  . LOBECTOMY Right 10/15/2016   Procedure: RIGHT UPPER LOBECTOMY;  Surgeon: [name], MD;  Location: [facility];  Service: Thoracic;  Laterality: Right;  . LYMPH NODE DISSECTION Right 10/15/2016   Procedure: LYMPH NODE DISSECTION;  Surgeon: [name], MD;  Location: [place];  Service: Thoracic;  Laterality: Right;  Marland Kitchen VAGINA RECONSTRUCTION SURGERY     tvt 2006- [place]  . VIDEO ASSISTED THORACOSCOPY (VATS)/WEDGE RESECTION Right 10/15/2016   Procedure: RIGHT VIDEO ASSISTED THORACOSCOPY (VATS)/WEDGE RESECTION;  Surgeon: [name], MD;  Location: [facility];  Service: Thoracic;  Laterality: Right;    Social History   Social History  . Marital status: Single    Spouse name: N/A  . Number of children: N/A  . Years of education: N/A   Occupational History  . Not on file.   Social History Main Topics  . Smoking status: Former Smoker    Packs/day: 1.00    Years: 32.00    Types: Cigarettes  . Smokeless tobacco: Never Used     Comment: quit smoking 15 yrs ago  . Alcohol use No  Comment: rarely  . Drug use: No  . Sexual activity: Not Currently    Birth control/ protection: Surgical   Other Topics Concern  . Not on file   Social History Narrative  . No narrative on file    Current Outpatient Prescriptions on File Prior to Visit  Medication Sig Dispense Refill  . Alogliptin Benzoate 25 MG TABS Take 25 mg by mouth daily.  3  . amLODipine (NORVASC) 5 MG tablet Take 5 mg by mouth daily at 6 PM.     . atorvastatin (LIPITOR) 10 MG tablet Take 10 mg by mouth daily at 6 PM.     . Calcium Carb-Cholecalciferol (CALCIUM 600+D3 PO) Take 1 tablet by mouth 2 (two) times a week.    . escitalopram (LEXAPRO) 10 MG tablet Take 1 tablet (10 mg total) by  mouth every morning. 30 tablet 5  . Insulin Disposable Pump (V-GO 20) KIT 1 Device by Does not apply route daily. (Patient taking differently: 1 Device by Does not apply route daily. humalog) 30 kit 11  . lisinopril (PRINIVIL,ZESTRIL) 40 MG tablet Take 40 mg by mouth daily.    . metFORMIN (GLUCOPHAGE-XR) 500 MG 24 hr tablet Take 1 tablet (500 mg total) by mouth 2 (two) times daily. 180 tablet 3  . Omega-3 Fatty Acids (FISH OIL) 600 MG CAPS Take 1,200 mg by mouth at bedtime.    Marland Kitchen oxyCODONE (OXY IR/ROXICODONE) 5 MG immediate release tablet Take 1-2 tablets (5-10 mg total) by mouth every 6 (six) hours as needed for severe pain. (Patient not taking: Reported on 11/06/2016) 30 tablet 0  . pantoprazole (PROTONIX) 40 MG tablet Take 40 mg by mouth daily.    . traZODone (DESYREL) 100 MG tablet Take 2 tablets (200 mg total) by mouth at bedtime. 60 tablet 5  . YUVAFEM 10 MCG TABS vaginal tablet Place 10 mcg vaginally See admin instructions. Twice a week as needed for vaginal dryness  11   No current facility-administered medications on file prior to visit.     Allergies  Allergen Reactions  . Synthroid [Levothyroxine Sodium] Anaphylaxis  . Iodine Hives  . Povidone-Iodine Itching and Rash    Family History  Problem Relation Age of Onset  . Asthma    . Diabetes    . Arthritis    . Anesthesia problems Neg Hx   . Hypotension Neg Hx   . Malignant hyperthermia Neg Hx   . Pseudochol deficiency Neg Hx   . Colon polyps Neg Hx   . Colon cancer Neg Hx   . Celiac disease Neg Hx   . Pancreatic cancer Neg Hx   . Stomach cancer Neg Hx   . Ulcerative colitis Neg Hx   . Crohn's disease Neg Hx     BP 140/82 (BP Location: Left Arm, Patient Position: Sitting, Cuff Size: Large)   Pulse 94   Temp 97.9 F (36.6 C) (Oral)   Ht 5' 6"  (1.676 m)   Wt 244 lb (110.7 kg)   SpO2 95%   BMI 39.38 kg/m   Review of Systems No weight change.      Objective:   Physical Exam VITAL SIGNS:  See vs page GENERAL: no  distress Pulses: dorsalis pedis intact bilat.   MSK: no deformity of the feet CV: no leg edema Skin:  no ulcer on the feet.  normal color and temp on the feet. Neuro: sensation is intact to touch on the feet.   Lab Results  Component Value Date  HGBA1C 8.6 (H) 10/11/2016      Assessment & Plan:  Insulin-requiring type 2 DM: she needs increased rx.  I have sent a prescription to your pharmacy, to increase the insulin quantity.   Patient Instructions  Please continue the same metformin Please take 1 click with breakfast, 2 with lunch, and 8 with supper.  Please do a blood test in 2-3 weeks, here at the office.   check your blood sugar twice a day.  vary the time of day when you check, between before the 3 meals, and at bedtime.  also check if you have symptoms of your blood sugar being too high or too low.  please keep a record of the readings and bring it to your next appointment here (or you can bring the meter itself).  You can write it on any piece of paper.  please call us sooner if your blood sugar goes below 70, or if you have a lot of readings over 200.   Please come back for a follow-up appointment in 3 months.

## 2016-11-22 ENCOUNTER — Encounter: Payer: Self-pay | Admitting: *Deleted

## 2016-11-22 NOTE — Progress Notes (Signed)
Oncology Nurse Navigator Documentation  Oncology Nurse Navigator Flowsheets 11/22/2016  Navigator Location [facility]  Navigator Encounter Type Letter/Fax/Email/MTOC letter mailed   Barriers/Navigation Needs Education  Education Other  Interventions Education  Education Method Written  Acuity Level 1  Time Spent with Patient 15

## 2016-11-29 ENCOUNTER — Encounter: Payer: Self-pay | Admitting: *Deleted

## 2016-11-29 ENCOUNTER — Ambulatory Visit: Payer: 59 | Attending: Internal Medicine | Admitting: Physical Therapy

## 2016-11-29 ENCOUNTER — Telehealth: Payer: Self-pay | Admitting: Internal Medicine

## 2016-11-29 ENCOUNTER — Ambulatory Visit (HOSPITAL_BASED_OUTPATIENT_CLINIC_OR_DEPARTMENT_OTHER): Payer: 59 | Admitting: Internal Medicine

## 2016-11-29 ENCOUNTER — Other Ambulatory Visit (HOSPITAL_BASED_OUTPATIENT_CLINIC_OR_DEPARTMENT_OTHER): Payer: 59

## 2016-11-29 ENCOUNTER — Other Ambulatory Visit: Payer: Self-pay | Admitting: *Deleted

## 2016-11-29 ENCOUNTER — Encounter: Payer: Self-pay | Admitting: Internal Medicine

## 2016-11-29 DIAGNOSIS — M25611 Stiffness of right shoulder, not elsewhere classified: Secondary | ICD-10-CM | POA: Diagnosis not present

## 2016-11-29 DIAGNOSIS — M25511 Pain in right shoulder: Secondary | ICD-10-CM | POA: Diagnosis present

## 2016-11-29 DIAGNOSIS — C3411 Malignant neoplasm of upper lobe, right bronchus or lung: Secondary | ICD-10-CM

## 2016-11-29 DIAGNOSIS — R911 Solitary pulmonary nodule: Secondary | ICD-10-CM

## 2016-11-29 DIAGNOSIS — Z902 Acquired absence of lung [part of]: Secondary | ICD-10-CM

## 2016-11-29 DIAGNOSIS — C3491 Malignant neoplasm of unspecified part of right bronchus or lung: Secondary | ICD-10-CM | POA: Insufficient documentation

## 2016-11-29 HISTORY — DX: Malignant neoplasm of unspecified part of right bronchus or lung: C34.91

## 2016-11-29 LAB — CBC WITH DIFFERENTIAL/PLATELET
BASO%: 0.2 % (ref 0.0–2.0)
Basophils Absolute: 0 10*3/uL (ref 0.0–0.1)
EOS%: 3.9 % (ref 0.0–7.0)
Eosinophils Absolute: 0.4 10*3/uL (ref 0.0–0.5)
HCT: 34.5 % — ABNORMAL LOW (ref 34.8–46.6)
HGB: 11 g/dL — ABNORMAL LOW (ref 11.6–15.9)
LYMPH%: 21.3 % (ref 14.0–49.7)
MCH: 23.4 pg — ABNORMAL LOW (ref 25.1–34.0)
MCHC: 31.9 g/dL (ref 31.5–36.0)
MCV: 73.4 fL — ABNORMAL LOW (ref 79.5–101.0)
MONO#: 0.5 10*3/uL (ref 0.1–0.9)
MONO%: 4.8 % (ref 0.0–14.0)
NEUT#: 7 10*3/uL — ABNORMAL HIGH (ref 1.5–6.5)
NEUT%: 69.8 % (ref 38.4–76.8)
Platelets: 242 10*3/uL (ref 145–400)
RBC: 4.7 10*6/uL (ref 3.70–5.45)
RDW: 14.7 % — ABNORMAL HIGH (ref 11.2–14.5)
WBC: 10 10*3/uL (ref 3.9–10.3)
lymph#: 2.1 10*3/uL (ref 0.9–3.3)

## 2016-11-29 LAB — COMPREHENSIVE METABOLIC PANEL
ALT: 15 U/L (ref 0–55)
AST: 14 U/L (ref 5–34)
Albumin: 3.6 g/dL (ref 3.5–5.0)
Alkaline Phosphatase: 105 U/L (ref 40–150)
Anion Gap: 8 mEq/L (ref 3–11)
BUN: 20 mg/dL (ref 7.0–26.0)
CO2: 28 mEq/L (ref 22–29)
Calcium: 9.5 mg/dL (ref 8.4–10.4)
Chloride: 103 mEq/L (ref 98–109)
Creatinine: 0.8 mg/dL (ref 0.6–1.1)
EGFR: 80 mL/min/{1.73_m2} — ABNORMAL LOW (ref >90)
Glucose: 183 mg/dl — ABNORMAL HIGH (ref 70–140)
Potassium: 4.3 mEq/L (ref 3.5–5.1)
Sodium: 139 mEq/L (ref 136–145)
Total Bilirubin: 0.28 mg/dL (ref 0.20–1.20)
Total Protein: 7.1 g/dL (ref 6.4–8.3)

## 2016-11-29 NOTE — Progress Notes (Signed)
[facility] Clinical Social Work  Clinical Social Work met with patient and Futures trader at [employer] appointment to offer support and assess for psychosocial needs.  Medical oncologist reviewed patient's diagnosis and recommended plan for 6 month scan/follow up.  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  The patient has no concerns after surgery.  She shared she has a good support system including many close friends.  Clinical Social Work briefly discussed Clinical Social Work role and [employer] support programs/services.  Clinical Social Work encouraged patient to call with any additional questions or concerns.   Maryjean Morn, MSW, LCSW, OSW-C Clinical Social Worker [facility] [phone]

## 2016-11-29 NOTE — Progress Notes (Signed)
Oncology Nurse Navigator Documentation  Oncology Nurse Navigator Flowsheets 11/29/2016  Navigator Location [facility]  Navigator Encounter Type Clinic/MDC/spoke with patient today at thoracic clinic.  She is doing well after surgery and without complaints today.  I gave and explained information on lung cancer, resources and next steps.    Patient Visit Type MedOnc  Treatment Phase Follow-up  Barriers/Navigation Needs Education  Education Other  Interventions Education  Education Method Verbal;Written  Support Groups/Services Other  Acuity Level 2  Acuity Level 2 Educational needs  Time Spent with Patient 30

## 2016-11-29 NOTE — Progress Notes (Signed)
[place] Telephone:(336) [phone]   Fax:(336) [phone] Multidisciplinary thoracic oncology clinic  CONSULT NOTE  REFERRING PHYSICIAN: Dr. Modesto Charon.  REASON FOR CONSULTATION:  61 years old white female recently diagnosed with lung cancer.  HPI [name] is a 60 y.o. female was past medical history significant for hypertension, diabetes mellitus, GERD, depression and long history of smoking but quit 14 years ago. The patient saw a commercial about CT scan for lung cancer and she was seeing her primary care physician for routine evaluation and requested a screening scan. The scan was performed on 09/14/2016 and it showed 1.51 cm spiculated posterior right upper lobe nodule suspicious for primary bronchogenic neoplasm. This was followed by a PET scan on 09/28/2016 and it showed hypermetabolic 1.5 cm spiculated posterior right upper lobe pulmonary nodule consistent with primary bronchogenic neoplasm. There was no evidence for metastatic disease in the chest, abdomen or pelvis. There was also no evidence for FDG accumulation in the 1.6 cm right adrenal nodule. This has been stable since 2013 and compatible with adenoma. The patient was referred to Dr. Roxan Hockey and on 10/15/2016 she underwent right VATS with right upper lobectomy and mediastinal lymph node dissection. The final pathology ([id number]) showed well-differentiated invasive adenocarcinoma spanning 1.6 cm. The resection margin and dissected lymph nodes were negative for malignancy. There was no evidence for involvement of the visceral pleura or lymphovascular invasion. Dr. Roxan Hockey kindly referred the patient to me today for evaluation and recommendation regarding her condition. When seen today the patient is feeling fine but continues to have mild soreness in the right side of the chest at the surgical scar. She denied having any cough or hemoptysis but has shortness breath with exertion. She denied  having any weight loss or night sweats. She has no nausea, vomiting, diarrhea or constipation. She denied having any headache or visual changes. Family history significant for mother with brain cancer and father died from old age at age 36. The patient is single and has no children. She worked on roads and Nurse, mental health. She is originally from [place] but she is an [place] for work for several months. She has a history of smoking more than one pack per day for around 35 years but quit 14 years ago. She also drinks alcohol occasionally and no history of drug abuse.   HPI  Past Medical History:  Diagnosis Date  . Cancer ([place])   . Chronic pain   . Depression    takes Lexapro daily  . Diabetes mellitus    takes Metformin daily and has an insulin pump.Fasting blood sugar runs250  . GERD (gastroesophageal reflux disease)    takes Pantoprazole daily  . History of colon polyps    benign  . Hyperlipidemia    takes Atorvastatin daily  . Hypertension    takes Amlodipine and Lisinopril daily  . Insomnia    takes Trazodone nightly  . Nocturia   . Thyroid disease   . Urinary frequency   . Urinary urgency     Past Surgical History:  Procedure Laterality Date  . ABDOMINAL HYSTERECTOMY    . ABDOMINAL SURGERY    . COLONOSCOPY  2005 MAC   TICs, IH  . COLONOSCOPY N/A 03/08/2014   Procedure: COLONOSCOPY;  Surgeon: Danie Binder, MD;  [facility];  Service: Endoscopy;  Laterality: N/A;  1015  . CYSTOSTOMY  11/22/2011   Procedure: CYSTOSTOMY SUPRAPUBIC;  Surgeon: Marissa Nestle, MD;  [facility];  Service: Urology;  Laterality: N/A;  . ESOPHAGOGASTRODUODENOSCOPY N/A 03/08/2014   Procedure: ESOPHAGOGASTRODUODENOSCOPY (EGD);  Surgeon: Danie Binder, MD;  [facility];  Service: Endoscopy;  Laterality: N/A;  . ESOPHAGOGASTRODUODENOSCOPY    . HALLUX VALGUS CORRECTION     bilateral foot surgery for bone repairs-multiple  . LOBECTOMY Right 10/15/2016    Procedure: RIGHT UPPER LOBECTOMY;  Surgeon: [name], MD;  Location: [facility];  Service: Thoracic;  Laterality: Right;  . LYMPH NODE DISSECTION Right 10/15/2016   Procedure: LYMPH NODE DISSECTION;  Surgeon: [name], MD;  Location: [facility];  Service: Thoracic;  Laterality: Right;  Marland Kitchen VAGINA RECONSTRUCTION SURGERY     tvt 2006- [place]  . VIDEO ASSISTED THORACOSCOPY (VATS)/WEDGE RESECTION Right 10/15/2016   Procedure: RIGHT VIDEO ASSISTED THORACOSCOPY (VATS)/WEDGE RESECTION;  Surgeon: [name], MD;  Location: [facility];  Service: Thoracic;  Laterality: Right;    Family History  Problem Relation Age of Onset  . Asthma    . Diabetes    . Arthritis    . Anesthesia problems Neg Hx   . Hypotension Neg Hx   . Malignant hyperthermia Neg Hx   . Pseudochol deficiency Neg Hx   . Colon polyps Neg Hx   . Colon cancer Neg Hx   . Celiac disease Neg Hx   . Pancreatic cancer Neg Hx   . Stomach cancer Neg Hx   . Ulcerative colitis Neg Hx   . Crohn's disease Neg Hx     Social History Social History  Substance Use Topics  . Smoking status: Former Smoker    Packs/day: 1.00    Years: 32.00    Types: Cigarettes  . Smokeless tobacco: Never Used     Comment: quit smoking 15 yrs ago  . Alcohol use No     Comment: rarely    Allergies  Allergen Reactions  . Synthroid [Levothyroxine Sodium] Anaphylaxis  . Iodine Hives  . Povidone-Iodine Itching and Rash    Current Outpatient Prescriptions  Medication Sig Dispense Refill  . Alogliptin Benzoate 25 MG TABS Take 25 mg by mouth daily.  3  . amLODipine (NORVASC) 5 MG tablet Take 5 mg by mouth daily at 6 PM.     . atorvastatin (LIPITOR) 10 MG tablet Take 10 mg by mouth daily at 6 PM.     . Calcium Carb-Cholecalciferol (CALCIUM 600+D3 PO) Take 1 tablet by mouth 2 (two) times a week.    . escitalopram (LEXAPRO) 10 MG tablet Take 1 tablet (10 mg total) by mouth every morning. 30 tablet 5  . Insulin Disposable Pump (V-GO  20) KIT 1 Device by Does not apply route daily. (Patient taking differently: 1 Device by Does not apply route daily. humalog) 30 kit 11  . insulin lispro (HUMALOG) 100 UNIT/ML injection For use in pump, total of 70 units per day 30 mL 11  . lisinopril (PRINIVIL,ZESTRIL) 40 MG tablet Take 40 mg by mouth daily.    . metFORMIN (GLUCOPHAGE-XR) 500 MG 24 hr tablet Take 1 tablet (500 mg total) by mouth 2 (two) times daily. 180 tablet 3  . Omega-3 Fatty Acids (FISH OIL) 600 MG CAPS Take 1,200 mg by mouth at bedtime.    Marland Kitchen oxyCODONE (OXY IR/ROXICODONE) 5 MG immediate release tablet Take 1-2 tablets (5-10 mg total) by mouth every 6 (six) hours as needed for severe pain. (Patient not taking: Reported on 11/06/2016) 30 tablet 0  . pantoprazole (PROTONIX) 40 MG tablet Take 40 mg  by mouth daily.    . traZODone (DESYREL) 100 MG tablet Take 2 tablets (200 mg total) by mouth at bedtime. 60 tablet 5  . YUVAFEM 10 MCG TABS vaginal tablet Place 10 mcg vaginally See admin instructions. Twice a week as needed for vaginal dryness  11   No current facility-administered medications for this visit.     Review of Systems  Constitutional: negative Eyes: negative Ears, nose, mouth, throat, and face: negative Respiratory: positive for dyspnea on exertion and pleurisy/chest pain Cardiovascular: negative Gastrointestinal: negative Genitourinary:negative Integument/breast: negative Hematologic/lymphatic: negative Musculoskeletal:negative Neurological: negative Behavioral/Psych: negative Endocrine: negative Allergic/Immunologic: negative  Physical Exam  HOZ:YYQMG, healthy, no distress, well nourished and well developed SKIN: skin color, texture, turgor are normal, no rashes or significant lesions HEAD: Normocephalic, No masses, lesions, tenderness or abnormalities EYES: normal, PERRLA, Conjunctiva are pink and non-injected EARS: External ears normal, Canals clear OROPHARYNX:no exudate, no erythema and lips, buccal  mucosa, and tongue normal  NECK: supple, no adenopathy, no JVD LYMPH:  no palpable lymphadenopathy BREAST:not examined LUNGS: clear to auscultation , and palpation HEART: regular rate & rhythm, no murmurs and no gallops ABDOMEN:abdomen soft, non-tender, obese, normal bowel sounds and no masses or organomegaly BACK: Back symmetric, no curvature., No CVA tenderness EXTREMITIES:no joint deformities, effusion, or inflammation, no edema, no skin discoloration  NEURO: alert & oriented x 3 with fluent speech, no focal motor/sensory deficits  PERFORMANCE STATUS: ECOG 1  LABORATORY DATA: Lab Results  Component Value Date   WBC 10.0 11/29/2016   HGB 11.0 (L) 11/29/2016   HCT 34.5 (L) 11/29/2016   MCV 73.4 (L) 11/29/2016   PLT 242 11/29/2016      Chemistry      Component Value Date/Time   NA 139 11/29/2016 1233   K 4.3 11/29/2016 1233   CL 102 10/17/2016 0330   CO2 28 11/29/2016 1233   BUN 20.0 11/29/2016 1233   CREATININE 0.8 11/29/2016 1233      Component Value Date/Time   CALCIUM 9.5 11/29/2016 1233   ALKPHOS 105 11/29/2016 1233   AST 14 11/29/2016 1233   ALT 15 11/29/2016 1233   BILITOT 0.28 11/29/2016 1233       RADIOGRAPHIC STUDIES: Dg Chest 2 View  Result Date: 11/06/2016 CLINICAL DATA:  Post VATS and right upper lobectomy EXAM: CHEST  2 VIEW COMPARISON:  10/19/2016 FINDINGS: Cardiomediastinal silhouette is stable. Again noted volume loss and elevation of the right hemidiaphragm post right upper lobectomy. Stable postsurgical changes right hilum. No infiltrate or pulmonary edema. Stable mild pleural scarring right base. Bony thorax is unremarkable. IMPRESSION: Again noted volume loss and elevation of the right hemidiaphragm post right upper lobectomy. Stable postsurgical changes right hilum. No infiltrate or pulmonary edema. Stable mild pleural scarring right base. Electronically Signed   By: Lahoma Crocker M.D.   On: 11/06/2016 10:08   Mm Screening Breast Tomo  Bilateral  Result Date: 11/08/2016 CLINICAL DATA:  Screening. EXAM: 2D DIGITAL SCREENING BILATERAL MAMMOGRAM WITH CAD AND ADJUNCT TOMO COMPARISON:  Previous exam(s). ACR Breast Density Category b: There are scattered areas of fibroglandular density. FINDINGS: There are no findings suspicious for malignancy. Images were processed with CAD. IMPRESSION: No mammographic evidence of malignancy. A result letter of this screening mammogram will be mailed directly to the patient. RECOMMENDATION: Screening mammogram in one year. (Code:SM-B-01Y) BI-RADS CATEGORY  1: Negative. Electronically Signed   By: Abelardo Diesel M.D.   On: 11/08/2016 15:20    ASSESSMENT: This is a very pleasant 61 years  old white female recently diagnosed with a stage IA (T1a, N0, M0) non-small cell lung cancer, adenocarcinoma presented with right upper lobe pulmonary nodule is status post right upper lobectomy with lymph node dissection under the care of Dr. Roxan Hockey on 10/15/2016.  PLAN: I had a lengthy discussion with the patient today about her current disease stage, prognosis and treatment options. I explained to the patient that there is no survival benefit for adjuvant systemic chemotherapy for patient with a stage IA non-small cell lung cancer. I also explained to the patient that the median 5 year survival is around 80% for stage IA non-small cell lung cancer. I recommended for the patient to continue on observation and close monitoring. I will arrange for the patient to come back for follow-up visit in 6 months for reevaluation with repeat CT scan of the chest. The patient was seen during the multidisciplinary thoracic oncology clinic today by medical oncology, thoracic navigator, social worker and physical therapist. For Hypertension the patient will continue his current treatment with Norvasc and lisinopril. For diabetes mellitus she is currently on metformin as well as insulin. She was advised to call immediately if she has  any concerning symptoms in the interval. The patient voices understanding of current disease status and treatment options and is in agreement with the current care plan. All questions were answered. The patient knows to call the clinic with any problems, questions or concerns. We can certainly see the patient much sooner if necessary. Thank you so much for allowing me to participate in the care of [name]. I will continue to follow up the patient with you and assist in her care. I spent 40 minutes counseling the patient face to face. The total time spent in the appointment was 60 minutes.  Disclaimer: This note was dictated with voice recognition software. Similar sounding words can inadvertently be transcribed and may not be corrected upon review.   [name] K. Nov 29, 2016, 1:22 PM

## 2016-11-29 NOTE — Telephone Encounter (Signed)
Appointments scheduled per 11/29/16 los. Patient was given a copy of the AVS report and appointment schedule, per 11/29/16 los.

## 2016-11-29 NOTE — Therapy (Signed)
[place], [place], [address] Phone: [phone]   Fax:  [phone]  Physical Therapy Evaluation  Patient Details  Name: [name] MRN: [id number] Date of Birth: 02/08/1955 Referring Provider: Dr. Curt Bears  Encounter Date: 11/29/2016      PT End of Session - 11/29/16 1410    Visit Number 1   Number of Visits 1   PT Start Time 6962   PT Stop Time 1352   PT Time Calculation (min) 15 min   Activity Tolerance Patient tolerated treatment well   Behavior During Therapy [facility] for tasks assessed/performed      Past Medical History:  Diagnosis Date  . Adenocarcinoma of right lung, stage 1 ([place]) 11/29/2016  . Cancer ([place])   . Chronic pain   . Depression    takes Lexapro daily  . Diabetes mellitus    takes Metformin daily and has an insulin pump.Fasting blood sugar runs250  . GERD (gastroesophageal reflux disease)    takes Pantoprazole daily  . History of colon polyps    benign  . Hyperlipidemia    takes Atorvastatin daily  . Hypertension    takes Amlodipine and Lisinopril daily  . Insomnia    takes Trazodone nightly  . Nocturia   . Thyroid disease   . Urinary frequency   . Urinary urgency     Past Surgical History:  Procedure Laterality Date  . ABDOMINAL HYSTERECTOMY    . ABDOMINAL SURGERY    . COLONOSCOPY  2005 MAC   TICs, IH  . COLONOSCOPY N/A 03/08/2014   Procedure: COLONOSCOPY;  Surgeon: Danie Binder, MD;  [facility];  Service: Endoscopy;  Laterality: N/A;  1015  . CYSTOSTOMY  11/22/2011   Procedure: CYSTOSTOMY SUPRAPUBIC;  Surgeon: Marissa Nestle, MD;  Location: [facility];  Service: Urology;  Laterality: N/A;  . ESOPHAGOGASTRODUODENOSCOPY N/A 03/08/2014   Procedure: ESOPHAGOGASTRODUODENOSCOPY (EGD);  Surgeon: Danie Binder, MD;  [facility];  Service: Endoscopy;  Laterality: N/A;  . ESOPHAGOGASTRODUODENOSCOPY    . HALLUX VALGUS CORRECTION     bilateral foot surgery for bone repairs-multiple  . LOBECTOMY Right 10/15/2016   Procedure: RIGHT UPPER LOBECTOMY;  Surgeon: [name], MD;  Location: [place];  Service: Thoracic;  Laterality: Right;  . LYMPH NODE DISSECTION Right 10/15/2016   Procedure: LYMPH NODE DISSECTION;  Surgeon: [name], MD;  Location: [place];  Service: Thoracic;  Laterality: Right;  Marland Kitchen VAGINA RECONSTRUCTION SURGERY     tvt 2006- [place]  . VIDEO ASSISTED THORACOSCOPY (VATS)/WEDGE RESECTION Right 10/15/2016   Procedure: RIGHT VIDEO ASSISTED THORACOSCOPY (VATS)/WEDGE RESECTION;  Surgeon: [name], MD;  Location: [place];  Service: Thoracic;  Laterality: Right;    There were no vitals filed for this visit.       Subjective Assessment - 11/29/16 1400    Subjective "Something just kept telling me to get the low dose CT screening, because I had been a smoker a long time ago."   Pertinent History Abnormal findings on low dose CT screening 09/17/16; had PET and then VATS lobectomy 10/15/16.  Ex-smoker (32 pack-years).  DM and on insulin; HTN.   Patient Stated Goals get info from all lung clinic providers   Currently in Pain? No/denies            [facility] PT Assessment - 11/29/16 0001      Assessment   Medical Diagnosis right upper lobe invasive adenocarcinoma, 1.6  cm. s/p VATS lobectomy   Referring Provider Dr. Curt Bears   Onset Date/Surgical Date 10/15/16   Prior Therapy none     Precautions   Precautions Other (comment);None     Restrictions   Weight Bearing Restrictions No     Balance Screen   Has the patient fallen in the past 6 months No   Has the patient had a decrease in activity level because of a fear of falling?  No   Is the patient reluctant to leave their home because of a fear of falling?  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; she plans to wear a mask   Leisure has been doing treadmill, rowing machine, stationary bike x 45 minutes 3 days a week; also does some upper body machines     Cognition   Overall Cognitive Status Within Functional Limits for tasks assessed     Observation/Other Assessments   Observations pleasant, gregarious woman in no distress     Coordination   Gross Motor Movements are Fluid and Coordinated Yes     Functional Tests   Functional tests Sit to Stand     Sit to Stand   Comments 12 times in 30 seconds, at average for her age     Posture/Postural Control   Posture/Postural Control No significant limitations     ROM / Strength   AROM / PROM / Strength AROM     AROM   Overall AROM Comments trunk AROM in standing is WFL in all directions; for right shoulder, function has been limited since her surgery and she has difficulty reaching her hair   AROM Assessment Site Shoulder   Right/Left Shoulder Right   Right Shoulder Flexion 140 Degrees  approx.; with some difficulty and pain; compensates   Right Shoulder ABduction 140 Degrees  approx.; pain felt at upper flank incision--dull ache   Right Shoulder Internal Rotation --  slightly limited   Right Shoulder External Rotation --  slightly limited     Ambulation/Gait   Ambulation/Gait Yes   Ambulation/Gait Assistance 7: Independent     Balance   Balance Assessed Yes     Dynamic Standing Balance   Dynamic Standing - Comments reaches 11 inches forward in standing, below average for her age                           PT Education - 11/29/16 1409    Education provided Yes   Education Details walking or other cardio exercise, Cure article on staying active, posture, breathing exercise   Person(s) Educated  Patient   Methods Explanation;Handout   Comprehension Verbalized understanding               Lung Clinic Goals - 11/29/16 1420      Patient will be able to verbalize understanding of the benefit of exercise to decrease fatigue.   Status Achieved     Patient will be able to verbalize the importance of posture.   Status Achieved     Patient will be able to demonstrate diaphragmatic breathing for improved lung function.  Status Achieved     Patient will be able to verbalize understanding of the role of physical therapy to prevent functional decline and who to contact if physical therapy is needed.   Status Achieved             Plan - 11/29/16 1410    Clinical Impression Statement This is a very pleasant and gregarious woman who looks slightly younger than her age.  She had a VATS right upper lobectomy about six weeks ago and has a well-healed four-inch long incision at right upper flank.  She has had some impaired function of her right shoulder since surgery making it difficult for her to reach on top of her head.  Her active shoulder flexion and abduction are a bit limited, painful, and difficult for her to reach up normally.  She has been trying to work this out herself by doing upper body machines at the gym.  Eval is moderate with comorbidities of IDDM and HTN. Status is evolving with recent surgery and currently limited right shoulder function from it.  She is an ex-smoker.   Rehab Potential Good   PT Frequency One time visit   PT Treatment/Interventions Patient/family education   PT Next Visit Plan None planned at this time, but patient does have impaired function of her right UE.  She is working to strengthen this herself, but may benefit from therapy if she does not satisfactorily improve.   PT Home Exercise Plan walking or other cardio exercise, breathing exercise, UE strengthening   Consulted and Agree with Plan of Care Patient      Patient will benefit from  skilled therapeutic intervention in order to improve the following deficits and impairments:  Impaired UE functional use, Decreased range of motion, Decreased strength, Pain  Visit Diagnosis: Stiffness of right shoulder, not elsewhere classified - Plan: PT plan of care cert/re-cert  Acute pain of right shoulder - Plan: PT plan of care cert/re-cert     Problem List Patient Active Problem List   Diagnosis Date Noted  . Adenocarcinoma of right lung, stage 1 ([place]) 11/29/2016  . S/P lobectomy of lung 10/15/2016  . Hyperlipidemia 10/02/2016  . Lung nodule 10/02/2016  . Adrenal adenoma, right 10/02/2016  . Diabetes ([place]) 06/16/2016  . HTN (hypertension) 05/31/2016  . Change in bowel habits 03/03/2014  . Depression 10/13/2013  . Fractured lateral malleolus 08/12/2012  . Left knee sprain 08/12/2012    [name] 11/29/2016, 2:22 PM  [place], [place], [address] Phone: [phone]   Fax:  [phone]  Name: [name] MRN: [id number] Date of Birth: 02/08/1955  Serafina Royals, PT 11/29/16 2:22 PM

## 2016-11-30 ENCOUNTER — Other Ambulatory Visit: Payer: 59

## 2016-11-30 DIAGNOSIS — E119 Type 2 diabetes mellitus without complications: Secondary | ICD-10-CM

## 2016-11-30 DIAGNOSIS — Z794 Long term (current) use of insulin: Principal | ICD-10-CM

## 2016-12-03 ENCOUNTER — Other Ambulatory Visit: Payer: Self-pay | Admitting: Nutrition

## 2016-12-03 LAB — FRUCTOSAMINE: Fructosamine: 263 umol/L (ref 190–270)

## 2016-12-04 ENCOUNTER — Other Ambulatory Visit: Payer: Self-pay

## 2016-12-04 MED ORDER — V-GO 20 KIT: PACK | 2 refills | Status: DC

## 2016-12-04 MED ORDER — INSULIN LISPRO 100 UNIT/ML ~~LOC~~ SOLN: SUBCUTANEOUS | 3 refills | Status: DC

## 2016-12-05 ENCOUNTER — Telehealth: Payer: Self-pay | Admitting: *Deleted

## 2016-12-05 NOTE — Telephone Encounter (Signed)
Oncology Nurse Navigator Documentation  Oncology Nurse Navigator Flowsheets 12/05/2016  Navigator Location [facility]  Navigator Encounter Type Telephone/I called to follow up on [name] today.  She stated she was doing well without complaints.  I listened as she explained.  I updated her on upcoming appts.  I asked that she call me if she had any questions or concerns.  She was thankful for the call.   Telephone Outgoing Call  Treatment Phase Follow-up  Barriers/Navigation Needs Education  Education Other  Interventions Education  Education Method Verbal  Acuity Level 1  Time Spent with Patient 30

## 2016-12-11 ENCOUNTER — Telehealth: Payer: Self-pay | Admitting: Endocrinology

## 2016-12-19 ENCOUNTER — Ambulatory Visit: Payer: 59 | Admitting: Gastroenterology

## 2016-12-26 ENCOUNTER — Encounter: Payer: Self-pay | Admitting: *Deleted

## 2016-12-26 ENCOUNTER — Telehealth: Payer: Self-pay | Admitting: *Deleted

## 2016-12-26 NOTE — Progress Notes (Signed)
Patient called back. I asked how she was feeling and she states great.  I listened as she explained. She has gone back to work and is feeling better everyday.  It was great to hear.  I asked that she call me if needed. She stated she would.

## 2016-12-26 NOTE — Telephone Encounter (Signed)
Oncology Nurse Navigator Documentation  Oncology Nurse Navigator Flowsheets 12/26/2016  Navigator Location [facility]  Navigator Encounter Type Telephone/I called today to check on [name] to see how she was doing.  I was unable to reach her but left vm message to call if needed.   Telephone Outgoing Call  Abnormal Finding Date 09/17/2016  Confirmed Diagnosis Date 10/15/2016  Surgery Date 10/15/2016  Multidisiplinary Clinic Date 11/29/2016  Treatment Initiated Date 10/15/2016  Treatment Phase Follow-up  Barriers/Navigation Needs Education  Education Other  Interventions Education  Education Method Verbal  Acuity Level 1  Acuity Level 1 Minimal follow up required  Time Spent with Patient 15

## 2016-12-31 NOTE — Addendum Note (Signed)
Addendum  created 12/31/16 1223 by Oleta Mouse, MD   Sign clinical note

## 2017-01-04 ENCOUNTER — Ambulatory Visit (INDEPENDENT_AMBULATORY_CARE_PROVIDER_SITE_OTHER): Payer: 59 | Admitting: Psychiatry

## 2017-01-04 ENCOUNTER — Encounter (HOSPITAL_COMMUNITY): Payer: Self-pay | Admitting: Psychiatry

## 2017-01-04 VITALS — BP 146/88 | HR 88 | Ht 66.0 in | Wt 254.0 lb

## 2017-01-04 DIAGNOSIS — F331 Major depressive disorder, recurrent, moderate: Secondary | ICD-10-CM | POA: Diagnosis not present

## 2017-01-04 MED ORDER — ESCITALOPRAM OXALATE 10 MG PO TABS: 10.0000 mg | ORAL_TABLET | Freq: Every morning | ORAL | 5 refills | Status: DC

## 2017-01-04 MED ORDER — TRAZODONE HCL 100 MG PO TABS: 200.0000 mg | ORAL_TABLET | Freq: Every day | ORAL | 5 refills | Status: DC
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
## 2017-01-07 ENCOUNTER — Other Ambulatory Visit: Payer: Self-pay | Admitting: Thoracic Surgery (Cardiothoracic Vascular Surgery)

## 2017-01-07 DIAGNOSIS — C3490 Malignant neoplasm of unspecified part of unspecified bronchus or lung: Secondary | ICD-10-CM

## 2017-01-08 ENCOUNTER — Ambulatory Visit (INDEPENDENT_AMBULATORY_CARE_PROVIDER_SITE_OTHER): Payer: Self-pay | Admitting: Thoracic Surgery (Cardiothoracic Vascular Surgery)

## 2017-01-08 ENCOUNTER — Encounter: Payer: Self-pay | Admitting: Thoracic Surgery (Cardiothoracic Vascular Surgery)

## 2017-01-08 ENCOUNTER — Ambulatory Visit
Admission: RE | Admit: 2017-01-08 | Discharge: 2017-01-08 | Disposition: A | Discharge status: Home / Self Care | Payer: 59 | Source: Ambulatory Visit | Attending: Thoracic Surgery (Cardiothoracic Vascular Surgery) | Admitting: Thoracic Surgery (Cardiothoracic Vascular Surgery)

## 2017-01-08 VITALS — BP 134/76 | HR 93 | Resp 16 | Ht 66.0 in | Wt 244.0 lb

## 2017-01-08 DIAGNOSIS — C3411 Malignant neoplasm of upper lobe, right bronchus or lung: Secondary | ICD-10-CM

## 2017-01-08 DIAGNOSIS — Z902 Acquired absence of lung [part of]: Secondary | ICD-10-CM

## 2017-01-08 DIAGNOSIS — C3490 Malignant neoplasm of unspecified part of unspecified bronchus or lung: Secondary | ICD-10-CM

## 2017-01-08 NOTE — Progress Notes (Signed)
[address]       [place] [address]             [phone]       HPI: Mrs. Santino returns for a scheduled postoperative follow-up visit  She is a 61 year old former smoker (quit 15 years ago) was found to have a right upper lobe nodule on a low-dose screening CT. She underwent right VATS, wedge resection and right upper lobectomy for stage IA non-small cell carcinoma on 10/15/2016. She went home on postoperative day #4.  I last saw in the office on 11/06/2016. She was doing well at that time with no pain.  She saw Dr. Julien Nordmann. She does not need adjuvant therapy.  She continues to do well. She does not have any incisional pain. She has resumed her normal activities. She does not have any significant shortness of breath.  Past Medical History:  Diagnosis Date  . Adenocarcinoma of right lung, stage 1 ([place]) 11/29/2016  . Cancer ([place])   . Chronic pain   . Depression    takes Lexapro daily  . Diabetes mellitus    takes Metformin daily and has an insulin pump.Fasting blood sugar runs250  . GERD (gastroesophageal reflux disease)    takes Pantoprazole daily  . History of colon polyps    benign  . Hyperlipidemia    takes Atorvastatin daily  . Hypertension    takes Amlodipine and Lisinopril daily  . Insomnia    takes Trazodone nightly  . Nocturia   . Thyroid disease   . Urinary frequency   . Urinary urgency   '   Current Outpatient Prescriptions  Medication Sig Dispense Refill  . Alogliptin Benzoate 25 MG TABS Take 25 mg by mouth daily.  3  . amLODipine (NORVASC) 5 MG tablet Take 5 mg by mouth daily at 6 PM.     . atorvastatin (LIPITOR) 10 MG tablet Take 10 mg by mouth daily at 6 PM.     . Calcium Carb-Cholecalciferol (CALCIUM 600+D3 PO) Take 1 tablet by mouth 2 (two) times a week.    . chlorthalidone (HYGROTON) 25 MG tablet Take 25 mg by mouth as directed.    . escitalopram (LEXAPRO) 10 MG tablet Take 1 tablet (10 mg total) by mouth every morning. 30  tablet 5  . Insulin Disposable Pump (V-GO 20) KIT humalog 90 kit 2  . insulin lispro (HUMALOG) 100 UNIT/ML injection For use in pump, total of 70 units per day 90 mL 3  . lisinopril (PRINIVIL,ZESTRIL) 40 MG tablet Take 40 mg by mouth daily.    . metFORMIN (GLUCOPHAGE-XR) 500 MG 24 hr tablet Take 1 tablet (500 mg total) by mouth 2 (two) times daily. 180 tablet 3  . Omega-3 Fatty Acids (FISH OIL) 600 MG CAPS Take 1,200 mg by mouth at bedtime.    . pantoprazole (PROTONIX) 40 MG tablet Take 40 mg by mouth daily.    . traZODone (DESYREL) 100 MG tablet Take 2 tablets (200 mg total) by mouth at bedtime. 60 tablet 5  . YUVAFEM 10 MCG TABS vaginal tablet Place 10 mcg vaginally See admin instructions. Twice a week as needed for vaginal dryness  11   No current facility-administered medications for this visit.     Physical Exam BP 134/76 (BP Location: Left Arm, Patient Position: Sitting, Cuff Size: Large)   Pulse 93   Resp 16   Ht '5\' 6"'  (1.676 m)   Wt 244 lb (110.7 kg)  SpO2 96% Comment: RA  BMI 39.59 kg/m  61 year old woman in no acute distress Alert and oriented 3 with no focal deficits Lungs diminished right base, otherwise clear Cardiac regular rate and rhythm normal S1 and S2  Diagnostic Tests: CHEST  2 VIEW  COMPARISON:  11/06/2016  FINDINGS: Cardiac shadow is within normal limits. The lungs are well aerated bilaterally. Postsurgical changes on the right are again noted with mild volume loss. No acute infiltrate or sizable effusion is noted. No recurrent mass lesion is seen.  IMPRESSION: Postoperative changes on the right.  No acute abnormality seen.   Electronically Signed   By: Inez Catalina M.D.   On: 01/08/2017 09:37 I personally reviewed the chest x-ray and concur with the findings noted above  Impression: Ms. Shankland is a 61 year old woman who had a right upper lobectomy for a stage Ia non-small cell carcinoma in March 2018. She is now about 3 months out from  surgery and having no issues. She denies any pain or shortness of breath.  Her activities are unrestricted.  She will see Dr. Julien Nordmann every 6 months with a chest CT for the first 2 years and then annually after that. I will see her back in about 10 months for one year follow-up. I will just do a chest x-ray at that time.  Plan: Return in 10 months with chest x-ray  [name], MD [facility] [phone]

## 2017-02-15 ENCOUNTER — Encounter: Payer: Self-pay | Admitting: Endocrinology

## 2017-02-15 ENCOUNTER — Ambulatory Visit (INDEPENDENT_AMBULATORY_CARE_PROVIDER_SITE_OTHER): Payer: 59 | Admitting: Endocrinology

## 2017-02-15 VITALS — BP 144/86 | HR 90 | Ht 66.0 in | Wt 260.0 lb

## 2017-02-15 DIAGNOSIS — Z794 Long term (current) use of insulin: Secondary | ICD-10-CM

## 2017-02-15 DIAGNOSIS — E119 Type 2 diabetes mellitus without complications: Secondary | ICD-10-CM

## 2017-02-15 LAB — POCT GLYCOSYLATED HEMOGLOBIN (HGB A1C): Hemoglobin A1C: 10

## 2017-02-15 MED ORDER — V-GO 40 KIT: 1.0000 | PACK | Freq: Every day | 11 refills | Status: DC

## 2017-02-15 NOTE — Patient Instructions (Addendum)
Please continue the same metformin, and: Increase to a V-GO-40.  Please take 2 clicks with breakfast, 3 with lunch, and 8 with supper.   Please call or message [place] next week, to tell us how the blood sugar is doing.   We can add victoza or trulicity if necessary.   check your blood sugar twice a day.  vary the time of day when you check, between before the 3 meals, and at bedtime.  also check if you have symptoms of your blood sugar being too high or too low.  please keep a record of the readings and bring it to your next appointment here (or you can bring the meter itself).  You can write it on any piece of paper.  please call us sooner if your blood sugar goes below 70, or if you have a lot of readings over 200.   Please come back for a follow-up appointment in 2 months.

## 2017-02-15 NOTE — Telephone Encounter (Signed)
error 

## 2017-02-15 NOTE — Progress Notes (Signed)
Subjective:    Patient ID: [name], female    DOB: 02/08/1955, 61 y.o.   MRN: [id number]  HPI Pt returns for f/u of diabetes mellitus: DM type: Insulin-requiring type 2 Dx'ed: 1655 Complications: none Therapy: insulin since soon after dx.  GDM: never (G0) DKA: never Severe hypoglycemia: never Pancreatitis: never Other: she takes multiple daily injections; she stopped victoza, due to lack of effect; she uses V-GO-20; metformin dosage is limited by nausea.   Interval history: no cbg record, but states cbg's are in the 200's.  There is no trend throughout the day.  pt states she feels well in general. Past Medical History:  Diagnosis Date  . Adenocarcinoma of right lung, stage 1 ([place]) 11/29/2016  . Cancer ([place])   . Chronic pain   . Depression    takes Lexapro daily  . Diabetes mellitus    takes Metformin daily and has an insulin pump.Fasting blood sugar runs250  . GERD (gastroesophageal reflux disease)    takes Pantoprazole daily  . History of colon polyps    benign  . Hyperlipidemia    takes Atorvastatin daily  . Hypertension    takes Amlodipine and Lisinopril daily  . Insomnia    takes Trazodone nightly  . Nocturia   . Thyroid disease   . Urinary frequency   . Urinary urgency     Past Surgical History:  Procedure Laterality Date  . ABDOMINAL HYSTERECTOMY    . ABDOMINAL SURGERY    . COLONOSCOPY  2005 MAC   TICs, IH  . COLONOSCOPY N/A 03/08/2014   Procedure: COLONOSCOPY;  Surgeon: Danie Binder, MD;  [facility];  Service: Endoscopy;  Laterality: N/A;  1015  . CYSTOSTOMY  11/22/2011   Procedure: CYSTOSTOMY SUPRAPUBIC;  Surgeon: Marissa Nestle, MD;  [facility];  Service: Urology;  Laterality: N/A;  . ESOPHAGOGASTRODUODENOSCOPY N/A 03/08/2014   Procedure: ESOPHAGOGASTRODUODENOSCOPY (EGD);  Surgeon: Danie Binder, MD;  [facility];  Service: Endoscopy;  Laterality: N/A;  . ESOPHAGOGASTRODUODENOSCOPY    . HALLUX VALGUS CORRECTION      bilateral foot surgery for bone repairs-multiple  . LOBECTOMY Right 10/15/2016   Procedure: RIGHT UPPER LOBECTOMY;  Surgeon: [name], MD;  Location: [facility];  Service: Thoracic;  Laterality: Right;  . LYMPH NODE DISSECTION Right 10/15/2016   Procedure: LYMPH NODE DISSECTION;  Surgeon: [name], MD;  Location: [facility];  Service: Thoracic;  Laterality: Right;  Marland Kitchen VAGINA RECONSTRUCTION SURGERY     tvt 2006- [place]  . VIDEO ASSISTED THORACOSCOPY (VATS)/WEDGE RESECTION Right 10/15/2016   Procedure: RIGHT VIDEO ASSISTED THORACOSCOPY (VATS)/WEDGE RESECTION;  Surgeon: [name], MD;  Location: [facility];  Service: Thoracic;  Laterality: Right;    Social History   Social History  . Marital status: Single    Spouse name: N/A  . Number of children: N/A  . Years of education: N/A   Occupational History  . Not on file.   Social History Main Topics  . Smoking status: Former Smoker    Packs/day: 1.00    Years: 32.00    Types: Cigarettes  . Smokeless tobacco: Never Used     Comment: quit smoking 15 yrs ago  . Alcohol use No     Comment: rarely  . Drug use: No  . Sexual activity: Not Currently    Birth control/ protection: Surgical   Other Topics Concern  . Not on file   Social History Narrative  .  No narrative on file    Current Outpatient Prescriptions on File Prior to Visit  Medication Sig Dispense Refill  . Alogliptin Benzoate 25 MG TABS Take 25 mg by mouth daily.  3  . amLODipine (NORVASC) 5 MG tablet Take 5 mg by mouth daily at 6 PM.     . atorvastatin (LIPITOR) 10 MG tablet Take 10 mg by mouth daily at 6 PM.     . Calcium Carb-Cholecalciferol (CALCIUM 600+D3 PO) Take 1 tablet by mouth 2 (two) times a week.    . chlorthalidone (HYGROTON) 25 MG tablet Take 25 mg by mouth as directed.    . escitalopram (LEXAPRO) 10 MG tablet Take 1 tablet (10 mg total) by mouth every morning. 30 tablet 5  . insulin lispro (HUMALOG) 100 UNIT/ML injection For  use in pump, total of 70 units per day 90 mL 3  . lisinopril (PRINIVIL,ZESTRIL) 40 MG tablet Take 40 mg by mouth daily.    . metFORMIN (GLUCOPHAGE-XR) 500 MG 24 hr tablet Take 1 tablet (500 mg total) by mouth 2 (two) times daily. 180 tablet 3  . Omega-3 Fatty Acids (FISH OIL) 600 MG CAPS Take 1,200 mg by mouth at bedtime.    . pantoprazole (PROTONIX) 40 MG tablet Take 40 mg by mouth daily.    . traZODone (DESYREL) 100 MG tablet Take 2 tablets (200 mg total) by mouth at bedtime. 60 tablet 5  . YUVAFEM 10 MCG TABS vaginal tablet Place 10 mcg vaginally See admin instructions. Twice a week as needed for vaginal dryness  11   No current facility-administered medications on file prior to visit.     Allergies  Allergen Reactions  . Synthroid [Levothyroxine Sodium] Anaphylaxis  . Iodine Hives  . Povidone-Iodine Itching and Rash    Family History  Problem Relation Age of Onset  . Asthma Unknown   . Diabetes Unknown   . Arthritis Unknown   . Anesthesia problems Neg Hx   . Hypotension Neg Hx   . Malignant hyperthermia Neg Hx   . Pseudochol deficiency Neg Hx   . Colon polyps Neg Hx   . Colon cancer Neg Hx   . Celiac disease Neg Hx   . Pancreatic cancer Neg Hx   . Stomach cancer Neg Hx   . Ulcerative colitis Neg Hx   . Crohn's disease Neg Hx     BP (!) 144/86   Pulse 90   Ht _0  (1.676 m)   Wt 260 lb (117.9 kg)   SpO2 96%   BMI 41.97 kg/m   Review of Systems She denies hypoglycemia.  Nausea is resolved    Objective:   Physical Exam VITAL SIGNS:  See vs page GENERAL: no distress Pulses: foot pulses are intact bilaterally.   MSK: no deformity of the feet or ankles.  CV: no edema of the legs or ankles Skin:  no ulcer on the feet or ankles.  normal color and temp on the feet and ankles Neuro: sensation is intact to touch on the feet and ankles.     A1c=10.0%    Assessment & Plan:  Nausea, better.  We can add victoza if necessary. Insulin-requiring type 2 DM: worse.    Patient Instructions  Please continue the same metformin, and: Increase to a V-GO-40.  Please take 2 clicks with breakfast, 3 with lunch, and 8 with supper.   Please call or message [place] next week, to tell us how the blood sugar is doing.   We can  add victoza or trulicity if necessary.   check your blood sugar twice a day.  vary the time of day when you check, between before the 3 meals, and at bedtime.  also check if you have symptoms of your blood sugar being too high or too low.  please keep a record of the readings and bring it to your next appointment here (or you can bring the meter itself).  You can write it on any piece of paper.  please call us sooner if your blood sugar goes below 70, or if you have a lot of readings over 200.   Please come back for a follow-up appointment in 2 months.

## 2017-04-18 ENCOUNTER — Ambulatory Visit: Payer: Self-pay | Admitting: Endocrinology

## 2017-04-18 DIAGNOSIS — Z0289 Encounter for other administrative examinations: Secondary | ICD-10-CM

## 2017-04-22 ENCOUNTER — Encounter: Payer: Self-pay | Admitting: Orthopedic Surgery

## 2017-04-22 ENCOUNTER — Ambulatory Visit (INDEPENDENT_AMBULATORY_CARE_PROVIDER_SITE_OTHER): Payer: 59 | Admitting: Orthopedic Surgery

## 2017-04-22 ENCOUNTER — Ambulatory Visit (INDEPENDENT_AMBULATORY_CARE_PROVIDER_SITE_OTHER): Payer: 59

## 2017-04-22 VITALS — BP 130/80 | HR 87 | Ht 66.0 in | Wt 260.0 lb

## 2017-04-22 DIAGNOSIS — M25562 Pain in left knee: Secondary | ICD-10-CM

## 2017-04-22 NOTE — Progress Notes (Signed)
Patient ID: [name], female   DOB: 02/08/1955, 61 y.o.   MRN: [id number]  Chief Complaint  Patient presents with  . Knee Pain    left side    HPI [name] is a 61 y.o. female.  61 year old female seen by [place] last year was doing her normal 4-5 mile walk and later that night she noticed increased pain which is worsened next day. This was approximately 2-1/2 weeks ago. She has taken some over-the-counter medication use a knee sleeve which is helped  She now complains of pain over the medial joint line pain when she pivots or twists on her knee. She describes her pain is mild to moderate dull aching primarily of the medial compartment it's constant but increases and decreases depending on position of the knee and she has noticed some mild giving out symptoms  Review of Systems Review of Systems  Respiratory: Negative for shortness of breath.   Cardiovascular: Negative for chest pain.   (2 MINIMUM)  Past Medical History:  Diagnosis Date  . Adenocarcinoma of right lung, stage 1 ([place]) 11/29/2016  . Cancer ([place])   . Chronic pain   . Depression    takes Lexapro daily  . Diabetes mellitus    takes Metformin daily and has an insulin pump.Fasting blood sugar runs250  . GERD (gastroesophageal reflux disease)    takes Pantoprazole daily  . History of colon polyps    benign  . Hyperlipidemia    takes Atorvastatin daily  . Hypertension    takes Amlodipine and Lisinopril daily  . Insomnia    takes Trazodone nightly  . Nocturia   . Thyroid disease   . Urinary frequency   . Urinary urgency     Past Surgical History:  Procedure Laterality Date  . ABDOMINAL HYSTERECTOMY    . ABDOMINAL SURGERY    . COLONOSCOPY  2005 MAC   TICs, IH  . COLONOSCOPY N/A 03/08/2014   Procedure: COLONOSCOPY;  Surgeon: Danie Binder, MD;  [facility];  Service: Endoscopy;  Laterality: N/A;  1015  . CYSTOSTOMY  11/22/2011   Procedure: CYSTOSTOMY SUPRAPUBIC;  Surgeon: Marissa Nestle,  MD;  [facility];  Service: Urology;  Laterality: N/A;  . ESOPHAGOGASTRODUODENOSCOPY N/A 03/08/2014   Procedure: ESOPHAGOGASTRODUODENOSCOPY (EGD);  Surgeon: Danie Binder, MD;  [facility];  Service: Endoscopy;  Laterality: N/A;  . ESOPHAGOGASTRODUODENOSCOPY    . HALLUX VALGUS CORRECTION     bilateral foot surgery for bone repairs-multiple  . LOBECTOMY Right 10/15/2016   Procedure: RIGHT UPPER LOBECTOMY;  Surgeon: [name], MD;  Location: [facility];  Service: Thoracic;  Laterality: Right;  . LYMPH NODE DISSECTION Right 10/15/2016   Procedure: LYMPH NODE DISSECTION;  Surgeon: [name], MD;  Location: [facility];  Service: Thoracic;  Laterality: Right;  Marland Kitchen VAGINA RECONSTRUCTION SURGERY     tvt 2006- [place]  . VIDEO ASSISTED THORACOSCOPY (VATS)/WEDGE RESECTION Right 10/15/2016   Procedure: RIGHT VIDEO ASSISTED THORACOSCOPY (VATS)/WEDGE RESECTION;  Surgeon: [name], MD;  Location: [facility];  Service: Thoracic;  Laterality: Right;    Social History Social History  Substance Use Topics  . Smoking status: Former Smoker    Packs/day: 1.00    Years: 32.00    Types: Cigarettes  . Smokeless tobacco: Never Used     Comment: quit smoking 15 yrs ago  . Alcohol use No     Comment: rarely    Allergies  Allergen Reactions  .  Synthroid [Levothyroxine Sodium] Anaphylaxis  . Iodine Hives  . Povidone-Iodine Itching and Rash    Current Meds  Medication Sig  . amLODipine (NORVASC) 5 MG tablet Take 5 mg by mouth daily at 6 PM.   . atorvastatin (LIPITOR) 10 MG tablet Take 10 mg by mouth daily at 6 PM.   . Calcium Carb-Cholecalciferol (CALCIUM 600+D3 PO) Take 1 tablet by mouth 2 (two) times a week.  . chlorthalidone (HYGROTON) 25 MG tablet Take 25 mg by mouth as directed.  . escitalopram (LEXAPRO) 10 MG tablet Take 1 tablet (10 mg total) by mouth every morning.  . Insulin Disposable Pump (V-GO 40) KIT 1 Device by Does not apply route daily.  . Insulin  Disposable Pump (V-GO 40) KIT by Does not apply route.  . insulin lispro (HUMALOG) 100 UNIT/ML injection For use in pump, total of 70 units per day  . lisinopril (PRINIVIL,ZESTRIL) 40 MG tablet Take 40 mg by mouth daily.  . metFORMIN (GLUCOPHAGE-XR) 500 MG 24 hr tablet Take 1 tablet (500 mg total) by mouth 2 (two) times daily.  . Omega-3 Fatty Acids (FISH OIL) 600 MG CAPS Take 1,200 mg by mouth at bedtime.  . pantoprazole (PROTONIX) 40 MG tablet Take 40 mg by mouth daily.  . traZODone (DESYREL) 100 MG tablet Take 2 tablets (200 mg total) by mouth at bedtime.  Merril Abbe 10 MCG TABS vaginal tablet Place 10 mcg vaginally See admin instructions. Twice a week as needed for vaginal dryness  . [DISCONTINUED] Alogliptin Benzoate 25 MG TABS Take 25 mg by mouth daily.      Physical Exam Physical Exam 1.BP 130/80   Pulse 87   Ht 5' 6"  (1.676 m)   Wt 260 lb (117.9 kg)   BMI 41.97 kg/m   2. Gen. appearance. The patient is well-developed and well-nourished, grooming and hygiene are normal. There are no gross congenital abnormalities  3. The patient is alert and oriented to person place and time  4. Mood and affect are normal  5. Ambulation remains fairly normal  Examination reveals the following: 6. On inspection we find left knee medial joint pain  7. With the range of motion of  125  8. Stability tests were normal  in all ligaments McMurray sign was positive 9. Strength tests revealed grade 5 motor strength  10. Skin we find no rash ulceration or erythema  11. Sensation remains intact  12 Vascular system shows no peripheral edema  Right knee normal range of motion stability and strength  MEDICAL DECISION MAKING:    Data Reviewed X-rays show arthritis no fracture see report  Assessment Encounter Diagnosis  Name Primary?  . Left knee pain, unspecified chronicity Yes    Plan Recommend 4 weeks of bracing Tylenol ibuprofen or Aleve relative rest with gradual resumption to  normal activity follow-up in 4 weeks if no improvement consider MRI  Arther Abbott 04/22/2017, 11:33 AM

## 2017-04-23 ENCOUNTER — Ambulatory Visit: Payer: 59 | Admitting: Orthopedic Surgery

## 2017-04-30 ENCOUNTER — Ambulatory Visit: Payer: Self-pay | Admitting: Endocrinology

## 2017-05-13 ENCOUNTER — Encounter: Payer: Self-pay | Admitting: Endocrinology

## 2017-05-13 ENCOUNTER — Ambulatory Visit (INDEPENDENT_AMBULATORY_CARE_PROVIDER_SITE_OTHER): Payer: 59 | Admitting: Endocrinology

## 2017-05-13 VITALS — BP 160/90 | HR 89 | Wt 267.8 lb

## 2017-05-13 DIAGNOSIS — E119 Type 2 diabetes mellitus without complications: Secondary | ICD-10-CM

## 2017-05-13 DIAGNOSIS — Z794 Long term (current) use of insulin: Secondary | ICD-10-CM | POA: Diagnosis not present

## 2017-05-13 LAB — POCT GLYCOSYLATED HEMOGLOBIN (HGB A1C): Hemoglobin A1C: 9.6

## 2017-05-13 MED ORDER — DULAGLUTIDE 0.75 MG/0.5ML ~~LOC~~ SOAJ: 0.7500 [IU] | SUBCUTANEOUS | 11 refills | Status: DC

## 2017-05-13 NOTE — Patient Instructions (Addendum)
Please continue the same metformin, and V-GO-40.  Also, I have sent a prescription to your pharmacy, to add "trulicity." this is a half dose size.  The side effect you might have is nausea.   Please call or message [place] next week, to tell us how the blood sugar is doing.  check your blood sugar twice a day.  vary the time of day when you check, between before the 3 meals, and at bedtime.  also check if you have symptoms of your blood sugar being too high or too low.  please keep a record of the readings and bring it to your next appointment here (or you can bring the meter itself).  You can write it on any piece of paper.  please call us sooner if your blood sugar goes below 70, or if you have a lot of readings over 200.   Please come back for a follow-up appointment in 3 months.

## 2017-05-13 NOTE — Progress Notes (Signed)
Subjective:    Patient ID: [name], female    DOB: 02/08/1955, 61 y.o.   MRN: [id number]  HPI Pt returns for f/u of diabetes mellitus: DM type: Insulin-requiring type 2 Dx'ed: 7616 Complications: none Therapy: insulin since soon after dx.  GDM: never (G0) DKA: never Severe hypoglycemia: never.  Pancreatitis: never Other: she stopped victoza, due to lack of effect; she uses V-GO-40; metformin dosage is limited by nausea.   Interval history: no cbg record, but states cbg's vary from 156-250.  There is no trend throughout the day.  pt states she feels well in general.  She takes a total of 15 clicks per day, via the V-GO-40.    Past Medical History:  Diagnosis Date  . Adenocarcinoma of right lung, stage 1 ([place]) 11/29/2016  . Cancer ([place])   . Chronic pain   . Depression    takes Lexapro daily  . Diabetes mellitus    takes Metformin daily and has an insulin pump.Fasting blood sugar runs250  . GERD (gastroesophageal reflux disease)    takes Pantoprazole daily  . History of colon polyps    benign  . Hyperlipidemia    takes Atorvastatin daily  . Hypertension    takes Amlodipine and Lisinopril daily  . Insomnia    takes Trazodone nightly  . Nocturia   . Thyroid disease   . Urinary frequency   . Urinary urgency     Past Surgical History:  Procedure Laterality Date  . ABDOMINAL HYSTERECTOMY    . ABDOMINAL SURGERY    . COLONOSCOPY  2005 MAC   TICs, IH  . COLONOSCOPY N/A 03/08/2014   Procedure: COLONOSCOPY;  Surgeon: Danie Binder, MD;  [facility];  Service: Endoscopy;  Laterality: N/A;  1015  . CYSTOSTOMY  11/22/2011   Procedure: CYSTOSTOMY SUPRAPUBIC;  Surgeon: Marissa Nestle, MD;  Location: [facility];  Service: Urology;  Laterality: N/A;  . ESOPHAGOGASTRODUODENOSCOPY N/A 03/08/2014   Procedure: ESOPHAGOGASTRODUODENOSCOPY (EGD);  Surgeon: Danie Binder, MD;  [facility];  Service: Endoscopy;  Laterality: N/A;  . ESOPHAGOGASTRODUODENOSCOPY      . HALLUX VALGUS CORRECTION     bilateral foot surgery for bone repairs-multiple  . LOBECTOMY Right 10/15/2016   Procedure: RIGHT UPPER LOBECTOMY;  Surgeon: [name], MD;  Location: [facility];  Service: Thoracic;  Laterality: Right;  . LYMPH NODE DISSECTION Right 10/15/2016   Procedure: LYMPH NODE DISSECTION;  Surgeon: [name], MD;  Location: [facility];  Service: Thoracic;  Laterality: Right;  Marland Kitchen VAGINA RECONSTRUCTION SURGERY     tvt 2006- [place]  . VIDEO ASSISTED THORACOSCOPY (VATS)/WEDGE RESECTION Right 10/15/2016   Procedure: RIGHT VIDEO ASSISTED THORACOSCOPY (VATS)/WEDGE RESECTION;  Surgeon: [name], MD;  Location: [facility];  Service: Thoracic;  Laterality: Right;    Social History   Social History  . Marital status: Single    Spouse name: N/A  . Number of children: N/A  . Years of education: N/A   Occupational History  . Not on file.   Social History Main Topics  . Smoking status: Former Smoker    Packs/day: 1.00    Years: 32.00    Types: Cigarettes  . Smokeless tobacco: Never Used     Comment: quit smoking 15 yrs ago  . Alcohol use No     Comment: rarely  . Drug use: No  . Sexual activity: Not Currently    Birth control/ protection: Surgical   Other Topics Concern  .  Not on file   Social History Narrative  . No narrative on file    Current Outpatient Prescriptions on File Prior to Visit  Medication Sig Dispense Refill  . atorvastatin (LIPITOR) 10 MG tablet Take 10 mg by mouth daily at 6 PM.     . Calcium Carb-Cholecalciferol (CALCIUM 600+D3 PO) Take 1 tablet by mouth 2 (two) times a week.    . chlorthalidone (HYGROTON) 25 MG tablet Take 25 mg by mouth as directed. Takes 1/2 tablet daily    . escitalopram (LEXAPRO) 10 MG tablet Take 1 tablet (10 mg total) by mouth every morning. 30 tablet 5  . Insulin Disposable Pump (V-GO 40) KIT 1 Device by Does not apply route daily. 30 kit 11  . insulin lispro (HUMALOG) 100 UNIT/ML injection  For use in pump, total of 70 units per day 90 mL 3  . lisinopril (PRINIVIL,ZESTRIL) 40 MG tablet Take 40 mg by mouth daily.    . metFORMIN (GLUCOPHAGE-XR) 500 MG 24 hr tablet Take 1 tablet (500 mg total) by mouth 2 (two) times daily. 180 tablet 3  . Omega-3 Fatty Acids (FISH OIL) 600 MG CAPS Take 1,200 mg by mouth at bedtime.    . pantoprazole (PROTONIX) 40 MG tablet Take 40 mg by mouth daily.    . traZODone (DESYREL) 100 MG tablet Take 2 tablets (200 mg total) by mouth at bedtime. 60 tablet 5  . YUVAFEM 10 MCG TABS vaginal tablet Place 10 mcg vaginally See admin instructions. Twice a week as needed for vaginal dryness  11  . amLODipine (NORVASC) 5 MG tablet Take 5 mg by mouth daily at 6 PM.      No current facility-administered medications on file prior to visit.     Allergies  Allergen Reactions  . Synthroid [Levothyroxine Sodium] Anaphylaxis  . Iodine Hives  . Povidone-Iodine Itching and Rash    Family History  Problem Relation Age of Onset  . Asthma Unknown   . Diabetes Unknown   . Arthritis Unknown   . Cancer Mother   . Cancer Brother   . Anesthesia problems Neg Hx   . Hypotension Neg Hx   . Malignant hyperthermia Neg Hx   . Pseudochol deficiency Neg Hx   . Colon polyps Neg Hx   . Colon cancer Neg Hx   . Celiac disease Neg Hx   . Pancreatic cancer Neg Hx   . Stomach cancer Neg Hx   . Ulcerative colitis Neg Hx   . Crohn's disease Neg Hx     BP (!) 160/90   Pulse 89   Wt 267 lb 12.8 oz (121.5 kg)   SpO2 96%   BMI 43.22 kg/m    Review of Systems She denies hypoglycemia    Objective:   Physical Exam VITAL SIGNS:  See vs page GENERAL: no distress Pulses: foot pulses are intact bilaterally.   MSK: no deformity of the feet or ankles.  CV: 1+ bilat edema of the legs.  Skin:  no ulcer on the feet or ankles.  normal color and temp on the feet and ankles Neuro: sensation is intact to touch on the feet and ankles.     A1c=9.6%    Assessment & Plan:    Insulin-requiring type 2 DM: she needs increased rx   Patient Instructions  Please continue the same metformin, and V-GO-40.  Also, I have sent a prescription to your pharmacy, to add "trulicity." this is a half dose size.  The side effect you  might have is nausea.   Please call or message [place] next week, to tell us how the blood sugar is doing.  check your blood sugar twice a day.  vary the time of day when you check, between before the 3 meals, and at bedtime.  also check if you have symptoms of your blood sugar being too high or too low.  please keep a record of the readings and bring it to your next appointment here (or you can bring the meter itself).  You can write it on any piece of paper.  please call us sooner if your blood sugar goes below 70, or if you have a lot of readings over 200.   Please come back for a follow-up appointment in 3 months.

## 2017-05-20 ENCOUNTER — Ambulatory Visit (INDEPENDENT_AMBULATORY_CARE_PROVIDER_SITE_OTHER): Payer: 59 | Admitting: Orthopedic Surgery

## 2017-05-20 ENCOUNTER — Encounter: Payer: Self-pay | Admitting: Orthopedic Surgery

## 2017-05-20 VITALS — BP 138/75 | HR 106 | Ht 66.0 in | Wt 268.0 lb

## 2017-05-20 DIAGNOSIS — M25562 Pain in left knee: Secondary | ICD-10-CM

## 2017-05-20 DIAGNOSIS — M23322 Other meniscus derangements, posterior horn of medial meniscus, left knee: Secondary | ICD-10-CM | POA: Diagnosis not present

## 2017-05-20 NOTE — Addendum Note (Signed)
Addended byCandice Camp on: 05/20/2017 05:09 PM   Modules accepted: Orders

## 2017-05-20 NOTE — Patient Instructions (Addendum)
Meniscus Tear A meniscus tear is a knee injury in which a piece of the meniscus is torn. The meniscus is a thick, rubbery, wedge-shaped cartilage in the knee. Two menisci are located in each knee. They sit between the upper bone (femur) and lower bone (tibia) that make up the knee joint. Each meniscus acts as a shock absorber for the knee. A torn meniscus is one of the most common types of knee injuries. This injury can range from mild to severe. Surgery may be needed for a severe tear. What are the causes? This injury may be caused by any squatting, twisting, or pivoting movement. Sports-related injuries are the most common cause. These often occur from:  Running and stopping suddenly.  Changing direction.  Being tackled or knocked off your feet.  As people get older, their meniscus gets thinner and weaker. In these people, tears can happen more easily, such as from climbing stairs. What increases the risk? This injury is more likely to happen to:  People who play contact sports.  Males.  People who are 30?63 years of age.  What are the signs or symptoms? Symptoms of this injury include:  Knee pain, especially at the side of the knee joint. You may feel pain when the injury occurs, or you may only hear a pop and feel pain later.  A feeling that your knee is clicking, catching, locking, or giving way.  Not being able to fully bend or extend your knee.  Bruising or swelling in your knee.  How is this diagnosed? This injury may be diagnosed based on your symptoms and a physical exam. The physical exam may include:  Moving your knee in different ways.  Feeling for tenderness.  Listening for a clicking sound.  Checking if your knee locks or catches.  You may also have tests, such as:  X-rays.  MRI.  A procedure to look inside your knee with a narrow surgical telescope (arthroscopy).  You may be referred to a knee specialist (orthopedic surgeon). How is this  treated? Treatment for this injury depends on the severity of the tear. Treatment for a mild tear may include:  Rest.  Medicine to reduce pain and swelling. This is usually a nonsteroidal anti-inflammatory drug (NSAID).  A knee brace or an elastic sleeve or wrap.  Using crutches or a walker to keep weight off your knee and to help you walk.  Exercises to strengthen your knee (physical therapy).  You may need surgery if you have a severe tear or if other treatments are not working. Follow these instructions at home: Managing pain and swelling  Take over-the-counter and prescription medicines only as told by your health care provider.  If directed, apply ice to the injured area: ? Put ice in a plastic bag. ? Place a towel between your skin and the bag. ? Leave the ice on for 20 minutes, 2-3 times per day.  Raise (elevate) the injured area above the level of your heart while you are sitting or lying down. Activity  Do not use the injured limb to support your body weight until your health care provider says that you can. Use crutches or a walker as told by your health care provider.  Return to your normal activities as told by your health care provider. Ask your health care provider what activities are safe for you.  Perform range-of-motion exercises only as told by your health care provider.  Begin doing exercises to strengthen your knee and leg muscles only   as told by your health care provider. After you recover, your health care provider may recommend these exercises to help prevent another injury. General instructions  Use a knee brace or elastic wrap as told by your health care provider.  Keep all follow-up visits as told by your health care provider. This is important. Contact a health care provider if:  You have a fever.  Your knee becomes red, tender, or swollen.  Your pain medicine is not helping.  Your symptoms get worse or do not improve after 2 weeks of home  care. This information is not intended to replace advice given to you by your health care provider. Make sure you discuss any questions you have with your health care provider. Document Released: 10/06/2002 Document Revised: 12/22/2015 Document Reviewed: 11/08/2014 Elsevier Interactive Patient Education  2018 Elsevier Inc.  

## 2017-05-20 NOTE — Progress Notes (Signed)
Progress Note   Patient ID: ESSA WENK, female   DOB: 02/08/1955, 61 y.o.   MRN: [id number]  Chief Complaint  Patient presents with  . Knee Pain    posterior knee pain at times/ states good days/ bad days     61 year old female seen by [place] last year was doing her normal 4-5 mile walk and later that night she noticed increased pain which is worsened next day. This was approximately 2-1/2 weeks ago. She has taken some over-the-counter medication use a knee sleeve which is helped   She now complains of pain over the medial joint line pain when she pivots or twists on her knee. She describes her pain is mild to moderate dull aching primarily of the medial compartment it's constant but increases and decreases depending on position of the knee and she has noticed some mild giving out symptoms  Initial history as noted above.  Patient still complains of popping and locking and catching. She has now had aOne-month course of anti-inflammatory medication with Advil and has no major improvement in her symptoms. The pain is medial she has occasional locking episodes no giving way but does have intermittent swelling     Review of Systems  Musculoskeletal: Negative for back pain.  Skin: Negative for rash.  Neurological: Negative for tingling and sensory change.   Current Meds  Medication Sig  . amLODipine (NORVASC) 5 MG tablet Take 5 mg by mouth daily at 6 PM.   . atorvastatin (LIPITOR) 20 MG tablet Take 20 mg by mouth daily.  . Calcium Carb-Cholecalciferol (CALCIUM 600+D3 PO) Take 1 tablet by mouth 2 (two) times a week.  . chlorthalidone (HYGROTON) 25 MG tablet Take 25 mg by mouth as directed. Takes 1/2 tablet daily  . Dulaglutide (TRULICITY) 6.64 QI/3.4VQ SOPN Inject 0.75 Units into the skin once a week.  . escitalopram (LEXAPRO) 10 MG tablet Take 1 tablet (10 mg total) by mouth every morning.  . Insulin Disposable Pump (V-GO 40) KIT 1 Device by Does not apply route daily.  . insulin lispro  (HUMALOG) 100 UNIT/ML injection For use in pump, total of 70 units per day  . lisinopril (PRINIVIL,ZESTRIL) 40 MG tablet Take 40 mg by mouth daily.  . metFORMIN (GLUCOPHAGE-XR) 500 MG 24 hr tablet Take 1 tablet (500 mg total) by mouth 2 (two) times daily.  . Omega-3 Fatty Acids (FISH OIL) 600 MG CAPS Take 1,200 mg by mouth at bedtime.  . pantoprazole (PROTONIX) 40 MG tablet Take 40 mg by mouth daily.  . traZODone (DESYREL) 100 MG tablet Take 2 tablets (200 mg total) by mouth at bedtime.  Merril Abbe 10 MCG TABS vaginal tablet Place 10 mcg vaginally See admin instructions. Twice a week as needed for vaginal dryness    Allergies  Allergen Reactions  . Synthroid [Levothyroxine Sodium] Anaphylaxis  . Iodine Hives  . Povidone-Iodine Itching and Rash     BP 138/75   Pulse (!) 106   Ht '5\' 6"'  (1.676 m)   Wt 268 lb (121.6 kg)   BMI 43.26 kg/m   Physical Exam   Gen. appearance the patient's appearance is normal with normal grooming and  hygiene The patient is oriented to person place and time Mood and affect are normal  BP 138/75   Pulse (!) 106   Ht '5\' 6"'  (1.676 m)   Wt 268 lb (121.6 kg)   BMI 43.26 kg/m  Ortho Exam   Ambulatory status is normal with no assistive devices  Repeat examination reveals tenderness in the popliteal fossa with a fullness suggesting posterior joint effusion with possible popliteal cyst, medial joint line tenderness with positive McMurray sign. Ligament stable and the coronal and sagittal plane motor exam normal. Sensation is intact in skin shows no lesions.  Medical decision-making Encounter Diagnoses  Name Primary?  . Left knee pain, unspecified chronicity   . Derangement of posterior horn of medial meniscus of left knee Yes   Recommend MRI left knee and she is not improved after 4 week course of anti-inflammatories and home exercises  Probable meniscal tear medial meniscus and will need surgical intervention   Meds ordered this encounter   Medications  . atorvastatin (LIPITOR) 20 MG tablet    Sig: Take 20 mg by mouth daily.    Refill:  [place], MD 05/20/2017 9:21 AM

## 2017-05-21 ENCOUNTER — Telehealth: Payer: Self-pay | Admitting: Radiology

## 2017-05-21 NOTE — Telephone Encounter (Signed)
I spoke to patient today regarding her MRI scan it has been approved by [facility] to be done at [facility] imaging, she will get a call to schedule, if she does not get a call she will let me know. She will also be in touch to make a follow up visit with [place] after the scan

## 2017-05-29 ENCOUNTER — Other Ambulatory Visit: Payer: Self-pay

## 2017-05-29 ENCOUNTER — Other Ambulatory Visit (HOSPITAL_BASED_OUTPATIENT_CLINIC_OR_DEPARTMENT_OTHER): Payer: 59

## 2017-05-29 ENCOUNTER — Ambulatory Visit (HOSPITAL_COMMUNITY)
Admission: RE | Admit: 2017-05-29 | Discharge: 2017-05-29 | Disposition: A | Discharge status: Home / Self Care | Payer: 59 | Source: Ambulatory Visit | Attending: Internal Medicine | Admitting: Internal Medicine

## 2017-05-29 ENCOUNTER — Encounter (HOSPITAL_COMMUNITY): Payer: Self-pay

## 2017-05-29 DIAGNOSIS — C3491 Malignant neoplasm of unspecified part of right bronchus or lung: Secondary | ICD-10-CM

## 2017-05-29 DIAGNOSIS — I700 Atherosclerosis of aorta: Secondary | ICD-10-CM | POA: Diagnosis not present

## 2017-05-29 DIAGNOSIS — Z902 Acquired absence of lung [part of]: Secondary | ICD-10-CM | POA: Insufficient documentation

## 2017-05-29 DIAGNOSIS — C3411 Malignant neoplasm of upper lobe, right bronchus or lung: Secondary | ICD-10-CM

## 2017-05-29 LAB — COMPREHENSIVE METABOLIC PANEL
ALT: 20 U/L (ref 0–55)
AST: 17 U/L (ref 5–34)
Albumin: 3.6 g/dL (ref 3.5–5.0)
Alkaline Phosphatase: 104 U/L (ref 40–150)
Anion Gap: 10 mEq/L (ref 3–11)
BUN: 13.5 mg/dL (ref 7.0–26.0)
CO2: 28 mEq/L (ref 22–29)
Calcium: 9.5 mg/dL (ref 8.4–10.4)
Chloride: 102 mEq/L (ref 98–109)
Creatinine: 0.8 mg/dL (ref 0.6–1.1)
EGFR: >60 mL/min/{1.73_m2} (ref >60)
Glucose: 184 mg/dl — ABNORMAL HIGH (ref 70–140)
Potassium: 4.8 mEq/L (ref 3.5–5.1)
Sodium: 141 mEq/L (ref 136–145)
Total Bilirubin: 0.4 mg/dL (ref 0.20–1.20)
Total Protein: 7.1 g/dL (ref 6.4–8.3)

## 2017-05-29 LAB — CBC WITH DIFFERENTIAL/PLATELET
BASO%: 0.3 % (ref 0.0–2.0)
Basophils Absolute: 0 10*3/uL (ref 0.0–0.1)
EOS%: 4.2 % (ref 0.0–7.0)
Eosinophils Absolute: 0.4 10*3/uL (ref 0.0–0.5)
HCT: 40.4 % (ref 34.8–46.6)
HGB: 12.8 g/dL (ref 11.6–15.9)
LYMPH%: 20.3 % (ref 14.0–49.7)
MCH: 24.7 pg — ABNORMAL LOW (ref 25.1–34.0)
MCHC: 31.7 g/dL (ref 31.5–36.0)
MCV: 78 fL — ABNORMAL LOW (ref 79.5–101.0)
MONO#: 0.5 10*3/uL (ref 0.1–0.9)
MONO%: 4.8 % (ref 0.0–14.0)
NEUT#: 6.6 10*3/uL — ABNORMAL HIGH (ref 1.5–6.5)
NEUT%: 70.4 % (ref 38.4–76.8)
Platelets: 236 10*3/uL (ref 145–400)
RBC: 5.18 10*6/uL (ref 3.70–5.45)
RDW: 14.5 % (ref 11.2–14.5)
WBC: 9.4 10*3/uL (ref 3.9–10.3)
lymph#: 1.9 10*3/uL (ref 0.9–3.3)

## 2017-05-29 MED ORDER — IOPAMIDOL (ISOVUE-300) INJECTION 61%
75.0000 mL | Freq: Once | INTRAVENOUS | Status: AC | PRN
  Administered 2017-05-29: 75 mL via INTRAVENOUS

## 2017-05-29 MED ORDER — IOPAMIDOL (ISOVUE-300) INJECTION 61%
INTRAVENOUS | Status: AC
  Filled 2017-05-29: qty 75

## 2017-06-03 ENCOUNTER — Telehealth: Payer: Self-pay | Admitting: Internal Medicine

## 2017-06-03 ENCOUNTER — Ambulatory Visit: Payer: 59 | Admitting: Internal Medicine

## 2017-06-03 ENCOUNTER — Encounter: Payer: Self-pay | Admitting: Internal Medicine

## 2017-06-03 VITALS — BP 140/66 | HR 88 | Temp 97.8°F | Resp 20 | Ht 66.0 in | Wt 269.5 lb

## 2017-06-03 DIAGNOSIS — I10 Essential (primary) hypertension: Secondary | ICD-10-CM

## 2017-06-03 DIAGNOSIS — R413 Other amnesia: Secondary | ICD-10-CM | POA: Diagnosis not present

## 2017-06-03 DIAGNOSIS — H538 Other visual disturbances: Secondary | ICD-10-CM

## 2017-06-03 DIAGNOSIS — C3411 Malignant neoplasm of upper lobe, right bronchus or lung: Secondary | ICD-10-CM | POA: Diagnosis not present

## 2017-06-03 DIAGNOSIS — C3490 Malignant neoplasm of unspecified part of unspecified bronchus or lung: Secondary | ICD-10-CM

## 2017-06-03 NOTE — Progress Notes (Signed)
[facility] [phone]   Fax:([phone]) [phone]  OFFICE PROGRESS NOTE  Koirala, Dibas, MD [place] [address] [place] [address]  DIAGNOSIS: Stage IA (T1a, N0, M0) non-small cell lung cancer, adenocarcinoma presented with right upper lobe pulmonary nodule   PRIOR THERAPY: S/P right VATS with right upper lobectomy and mediastinal lymph node dissection under the care of Dr. Roxan Hockey on October 15, 2016.  CURRENT THERAPY: Observation.  INTERVAL HISTORY: [name] 61 y.o. female returns to the clinic today for six-month follow-up visit.  The patient is feeling fine except for memory loss that has been getting worse recently.  She also has blurry vision and she is concerned about the possibility of brain metastasis.  She denied having any current chest pain, shortness of breath, cough or hemoptysis.  She has no nausea, vomiting, diarrhea or constipation.  The patient denied having any significant weight loss or night sweats.  She had repeat CT scan of the chest performed recently and she is here for evaluation and discussion of her scan results.  MEDICAL HISTORY: Past Medical History:  Diagnosis Date  . Adenocarcinoma of right lung, stage 1 ([place]) 11/29/2016  . Cancer ([place])   . Chronic pain   . Depression    takes Lexapro daily  . Diabetes mellitus    takes Metformin daily and has an insulin pump.Fasting blood sugar runs250  . GERD (gastroesophageal reflux disease)    takes Pantoprazole daily  . History of colon polyps    benign  . Hyperlipidemia    takes Atorvastatin daily  . Hypertension    takes Amlodipine and Lisinopril daily  . Insomnia    takes Trazodone nightly  . Nocturia   . Thyroid disease   . Urinary frequency   . Urinary urgency     ALLERGIES:  is allergic to synthroid [levothyroxine sodium]; iodine; and povidone-iodine.  MEDICATIONS:  Current Outpatient Medications  Medication Sig Dispense Refill  . amLODipine  (NORVASC) 5 MG tablet Take 5 mg by mouth daily at 6 PM.     . atorvastatin (LIPITOR) 20 MG tablet Take 20 mg by mouth daily.  11  . Calcium Carb-Cholecalciferol (CALCIUM 600+D3 PO) Take 1 tablet by mouth 2 (two) times a week.    . chlorthalidone (HYGROTON) 25 MG tablet Take 25 mg by mouth as directed. Takes 1/2 tablet daily    . Dulaglutide (TRULICITY) 1.66 AY/3.0ZS SOPN Inject 0.75 Units into the skin once a week. 1 pen 11  . escitalopram (LEXAPRO) 10 MG tablet Take 1 tablet (10 mg total) by mouth every morning. 30 tablet 5  . Insulin Disposable Pump (V-GO 40) KIT 1 Device by Does not apply route daily. 30 kit 11  . insulin lispro (HUMALOG) 100 UNIT/ML injection For use in pump, total of 70 units per day 90 mL 3  . lisinopril (PRINIVIL,ZESTRIL) 40 MG tablet Take 40 mg by mouth daily.    . metFORMIN (GLUCOPHAGE-XR) 500 MG 24 hr tablet Take 1 tablet (500 mg total) by mouth 2 (two) times daily. 180 tablet 3  . Omega-3 Fatty Acids (FISH OIL) 600 MG CAPS Take 1,200 mg by mouth at bedtime.    Glory Rosebush VERIO test strip USE TO TEST BLOOD SUGAR TWICE A DAY  12  . pantoprazole (PROTONIX) 40 MG tablet Take 40 mg by mouth daily.    . traZODone (DESYREL) 100 MG tablet Take 2 tablets (200 mg total) by mouth at bedtime. 60 tablet  5  . YUVAFEM 10 MCG TABS vaginal tablet Place 10 mcg vaginally See admin instructions. Twice a week as needed for vaginal dryness  11   No current facility-administered medications for this visit.     SURGICAL HISTORY:  Past Surgical History:  Procedure Laterality Date  . ABDOMINAL HYSTERECTOMY    . ABDOMINAL SURGERY    . COLONOSCOPY  2005 MAC   TICs, IH  . ESOPHAGOGASTRODUODENOSCOPY    . HALLUX VALGUS CORRECTION     bilateral foot surgery for bone repairs-multiple  . VAGINA RECONSTRUCTION SURGERY     tvt 2006- [place]    REVIEW OF SYSTEMS:  A comprehensive review of systems was negative except for: Neurological: positive for memory problems   PHYSICAL  EXAMINATION: General appearance: alert, cooperative and no distress Head: Normocephalic, without obvious abnormality, atraumatic Neck: no adenopathy, no JVD, supple, symmetrical, trachea midline and thyroid not enlarged, symmetric, no tenderness/mass/nodules Lymph nodes: Cervical, supraclavicular, and axillary nodes normal. Resp: clear to auscultation bilaterally Back: symmetric, no curvature. ROM normal. No CVA tenderness. Cardio: regular rate and rhythm, S1, S2 normal, no murmur, click, rub or gallop GI: soft, non-tender; bowel sounds normal; no masses,  no organomegaly Extremities: extremities normal, atraumatic, no cyanosis or edema  ECOG PERFORMANCE STATUS: 1 - Symptomatic but completely ambulatory  Blood pressure 140/66, pulse 88, temperature 97.8 F (36.6 C), temperature source Oral, resp. rate 20, height _0  (1.676 m), weight 269 lb 8 oz (122.2 kg), SpO2 98 %.  LABORATORY DATA: Lab Results  Component Value Date   WBC 9.4 05/29/2017   HGB 12.8 05/29/2017   HCT 40.4 05/29/2017   MCV 78.0 (L) 05/29/2017   PLT 236 05/29/2017      Chemistry      Component Value Date/Time   NA 141 05/29/2017 0921   K 4.8 05/29/2017 0921   CL 102 10/17/2016 0330   CO2 28 05/29/2017 0921   BUN 13.5 05/29/2017 0921   CREATININE 0.8 05/29/2017 0921      Component Value Date/Time   CALCIUM 9.5 05/29/2017 0921   ALKPHOS 104 05/29/2017 0921   AST 17 05/29/2017 0921   ALT 20 05/29/2017 0921   BILITOT 0.40 05/29/2017 0921       RADIOGRAPHIC STUDIES: Ct Chest W Contrast  Result Date: 05/29/2017 CLINICAL DATA:  Followup lung cancer. EXAM: CT CHEST WITH CONTRAST TECHNIQUE: Multidetector CT imaging of the chest was performed during intravenous contrast administration. CONTRAST:  33m ISOVUE-300 IOPAMIDOL (ISOVUE-300) INJECTION 61% COMPARISON:  None. FINDINGS: Cardiovascular: The heart size appears within normal limits. There is no pericardial effusion. Aortic atherosclerosis noted.  Mediastinum/Nodes: The trachea appears patent and is midline. Normal appearance of the esophagus. No enlarged mediastinal or hilar adenopathy. No axillary or supraclavicular adenopathy. Lungs/Pleura: Postoperative changes from right upper lobectomy identified. No complications. No findings to suggest local tumor recurrence. Upper Abdomen: Stable 1.5 cm right adrenal nodule. Left adrenal nodule measures 1.4 cm and is also unchanged in the interval. No acute abnormality identified. Musculoskeletal: No aggressive lytic or sclerotic bone lesions. IMPRESSION: 1. Status post right upper lobectomy. No evidence for recurrence of tumor or metastatic disease within the chest. 2.  Aortic Atherosclerosis (ICD10-I70.0). Electronically Signed   By: TKerby MoorsM.D.   On: 05/29/2017 14:41    ASSESSMENT AND PLAN: This is a very pleasant 61years old white female with stage Ia non-small cell lung cancer status post right upper lobectomy with lymph node dissection and currently on observation. The patient is feeling  fine today with no specific complaints except for intermittent confusion and memory loss as well as blurry vision. She had repeat CT scan of the chest performed recently that showed no evidence for disease recurrence. I discussed the scan results with the patient today and recommended for her to continue on observation with repeat CT scan of the chest in 6 months. For the memory loss and visual changes, I would order MRI of the brain to be performed next week to rule out the possibility of metastatic disease to the brain. The patient was advised to call immediately if she has any concerning symptoms in the interval. The patient voices understanding of current disease status and treatment options and is in agreement with the current care plan.  All questions were answered. The patient knows to call the clinic with any problems, questions or concerns. We can certainly see the patient much sooner if  necessary.  I spent 10 minutes counseling the patient face to face. The total time spent in the appointment was 15 minutes.  Disclaimer: This note was dictated with voice recognition software. Similar sounding words can inadvertently be transcribed and may not be corrected upon review.

## 2017-06-03 NOTE — Telephone Encounter (Signed)
Gave avs and calendar for may 2019

## 2017-06-05 ENCOUNTER — Ambulatory Visit
Admission: RE | Admit: 2017-06-05 | Discharge: 2017-06-05 | Disposition: A | Discharge status: Home / Self Care | Payer: 59 | Source: Ambulatory Visit | Attending: Orthopedic Surgery | Admitting: Orthopedic Surgery

## 2017-06-05 DIAGNOSIS — M25562 Pain in left knee: Secondary | ICD-10-CM

## 2017-06-05 DIAGNOSIS — M23322 Other meniscus derangements, posterior horn of medial meniscus, left knee: Secondary | ICD-10-CM

## 2017-06-10 ENCOUNTER — Ambulatory Visit (HOSPITAL_COMMUNITY)
Admission: RE | Admit: 2017-06-10 | Discharge: 2017-06-10 | Disposition: A | Discharge status: Home / Self Care | Payer: 59 | Source: Ambulatory Visit | Attending: Internal Medicine | Admitting: Internal Medicine

## 2017-06-10 DIAGNOSIS — C3490 Malignant neoplasm of unspecified part of unspecified bronchus or lung: Secondary | ICD-10-CM

## 2017-06-10 MED ORDER — GADOBENATE DIMEGLUMINE 529 MG/ML IV SOLN
20.0000 mL | Freq: Once | INTRAVENOUS | Status: AC | PRN
  Administered 2017-06-10: 20 mL via INTRAVENOUS

## 2017-06-12 ENCOUNTER — Telehealth: Payer: Self-pay | Admitting: Medical Oncology

## 2017-06-12 NOTE — Telephone Encounter (Signed)
Wants MRI results 

## 2017-06-12 NOTE — Telephone Encounter (Signed)
Per [facility] MRI good , no spread to her brain. Pt notified.

## 2017-06-12 NOTE — Telephone Encounter (Signed)
It was good.  No spread of the cancer to her brain.

## 2017-06-25 ENCOUNTER — Ambulatory Visit (HOSPITAL_COMMUNITY): Payer: 59 | Admitting: Psychiatry

## 2017-06-25 ENCOUNTER — Encounter (HOSPITAL_COMMUNITY): Payer: Self-pay | Admitting: Psychiatry

## 2017-06-25 VITALS — BP 136/74 | HR 100 | Ht 66.0 in | Wt 263.0 lb

## 2017-06-25 DIAGNOSIS — F419 Anxiety disorder, unspecified: Secondary | ICD-10-CM

## 2017-06-25 DIAGNOSIS — Z87891 Personal history of nicotine dependence: Secondary | ICD-10-CM | POA: Diagnosis not present

## 2017-06-25 DIAGNOSIS — F331 Major depressive disorder, recurrent, moderate: Secondary | ICD-10-CM

## 2017-06-25 DIAGNOSIS — M2550 Pain in unspecified joint: Secondary | ICD-10-CM

## 2017-06-25 MED ORDER — ESCITALOPRAM OXALATE 10 MG PO TABS: 10.0000 mg | ORAL_TABLET | Freq: Every morning | ORAL | 5 refills | Status: DC

## 2017-06-25 MED ORDER — TRAZODONE HCL 100 MG PO TABS: 200.0000 mg | ORAL_TABLET | Freq: Every day | ORAL | 5 refills | Status: DC
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
## 2017-06-28 ENCOUNTER — Ambulatory Visit: Payer: 59 | Admitting: Orthopedic Surgery

## 2017-06-28 VITALS — BP 147/90 | HR 90 | Ht 66.0 in | Wt 268.0 lb

## 2017-06-28 DIAGNOSIS — M1711 Unilateral primary osteoarthritis, right knee: Secondary | ICD-10-CM

## 2017-06-28 DIAGNOSIS — M23322 Other meniscus derangements, posterior horn of medial meniscus, left knee: Secondary | ICD-10-CM

## 2017-06-28 NOTE — Progress Notes (Signed)
Progress Note   Patient ID: [name], female   DOB: 02/08/1955, 61 y.o.   MRN: [id number]  Chief Complaint  Patient presents with  . Follow-up    MRI results of left knee.    61 year old female presents for reevaluation of left knee with MRI.  She continues to complain of left knee pain no improvement     Review of Systems  Musculoskeletal:       Exostosis noted on MRI with contrast patient with history of cancer.  She will see her oncologist to discuss further it did show non-enhancement indicating benign exostosis   No outpatient medications have been marked as taking for the 06/28/17 encounter (Office Visit) with Carole Civil, MD.    Allergies  Allergen Reactions  . Synthroid [Levothyroxine Sodium] Anaphylaxis  . Iodine Hives  . Povidone-Iodine Itching and Rash     BP (!) 147/90   Pulse 90   Ht 5\' 6"  (1.676 m)   Wt 268 lb (121.6 kg)   BMI 43.26 kg/m   Physical Exam  Constitutional: She is oriented to person, place, and time. She appears well-developed and well-nourished.  Neurological: She is alert and oriented to person, place, and time.  Psychiatric: She has a normal mood and affect. Judgment normal.  Vitals reviewed.  Physical exam findings include positive McMurray sign medial joint line tenderness with no ligamentous instability and her neurovascular exam is intact  Medical decision-making Encounter Diagnoses  Name Primary?  . Derangement of posterior horn of medial meniscus of left knee Yes  . Arthritis of right knee     I read her MRI she has multiple areas of osteoarthritis including patellofemoral joint with end-stage disease there a moderate amount of chondral thinning on the medial side with an oblique tear of the posterior horn the medial meniscus   The MRI report was read as follows IMPRESSION:  Patellofemoral: Essentially full-thickness loss of articular cartilage along portions of the posterior patellar ridge and adjacent  medial patellar facet, but with preservation of the lateral patellar facet articular cartilage. Mild chondral thinning in the femoral trochlear groove.   Medial: Moderate degenerative chondral thinning with marginal spurring and mild chondral irregularity.   Lateral: Partial thickness focal chondral defect centrally along the lateral femoral condyles on images 12-13 series 5.  1. Rate 3 oblique tear posterior horn medial meniscus with inferior surface extension. 2. Variable degrees of degenerative chondral thinning and chondral defects, as detailed above, with associated marginal spurring. 3. Small knee effusion with thickened medial plica. 4. Thickened MCL possibly from an old injury. 5. Minimal semimembranosus - tibial collateral ligament bursitis.     Electronically Signed   By: Van Clines M.D.   On: 06/05/2017 16:32   Recommend surgical arthroscopy medial meniscectomy.  I pulled her x-ray she does have cartilage left in between the bone so I think that a total knee can be held for later date and I indicated to her that this may be an issue.  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

## 2017-07-05 ENCOUNTER — Ambulatory Visit (HOSPITAL_COMMUNITY): Payer: Self-pay | Admitting: Psychiatry

## 2017-08-07 ENCOUNTER — Other Ambulatory Visit: Payer: Self-pay

## 2017-08-07 ENCOUNTER — Emergency Department (HOSPITAL_COMMUNITY)
Admission: EM | Admit: 2017-08-07 | Discharge: 2017-08-07 | Disposition: A | Discharge status: Home / Self Care | Payer: 59 | Attending: Emergency Medicine | Admitting: Emergency Medicine

## 2017-08-07 ENCOUNTER — Encounter (HOSPITAL_COMMUNITY): Payer: Self-pay | Admitting: Emergency Medicine

## 2017-08-07 DIAGNOSIS — M7918 Myalgia, other site: Secondary | ICD-10-CM | POA: Insufficient documentation

## 2017-08-07 DIAGNOSIS — J029 Acute pharyngitis, unspecified: Secondary | ICD-10-CM | POA: Diagnosis not present

## 2017-08-07 DIAGNOSIS — E119 Type 2 diabetes mellitus without complications: Secondary | ICD-10-CM | POA: Insufficient documentation

## 2017-08-07 DIAGNOSIS — R6889 Other general symptoms and signs: Secondary | ICD-10-CM

## 2017-08-07 DIAGNOSIS — Z79899 Other long term (current) drug therapy: Secondary | ICD-10-CM | POA: Diagnosis not present

## 2017-08-07 DIAGNOSIS — Z20818 Contact with and (suspected) exposure to other bacterial communicable diseases: Secondary | ICD-10-CM | POA: Insufficient documentation

## 2017-08-07 DIAGNOSIS — Z794 Long term (current) use of insulin: Secondary | ICD-10-CM | POA: Diagnosis not present

## 2017-08-07 DIAGNOSIS — R05 Cough: Secondary | ICD-10-CM | POA: Insufficient documentation

## 2017-08-07 DIAGNOSIS — R51 Headache: Secondary | ICD-10-CM | POA: Diagnosis not present

## 2017-08-07 DIAGNOSIS — I10 Essential (primary) hypertension: Secondary | ICD-10-CM | POA: Diagnosis not present

## 2017-08-07 DIAGNOSIS — Z85118 Personal history of other malignant neoplasm of bronchus and lung: Secondary | ICD-10-CM | POA: Diagnosis not present

## 2017-08-07 DIAGNOSIS — Z87891 Personal history of nicotine dependence: Secondary | ICD-10-CM | POA: Insufficient documentation

## 2017-08-07 DIAGNOSIS — R6883 Chills (without fever): Secondary | ICD-10-CM | POA: Diagnosis not present

## 2017-08-07 LAB — RAPID STREP SCREEN (MED CTR MEBANE ONLY): Streptococcus, Group A Screen (Direct): NEGATIVE

## 2017-08-07 MED ORDER — ACETAMINOPHEN 500 MG PO TABS: 500.0000 mg | ORAL_TABLET | Freq: Four times a day (QID) | ORAL | 0 refills | Status: DC | PRN

## 2017-08-07 NOTE — ED Provider Notes (Signed)
[facility] EMERGENCY DEPARTMENT Provider Note   CSN: [id number] Arrival date & time: 08/07/17  0455     History   Chief Complaint Chief Complaint  Patient presents with  . Sore Throat    HPI [name] is a 62 y.o. female.  HPI  This is a 62 year old female with a history of lung cancer, diabetes who presents with generalized body aches and sore throat.  Onset of symptoms yesterday morning.  Patient reports that she woke up with pain with swallowing.  She reports exposure to strep throat at work.  She reports chills without documented fevers.  Reports headache.  No neck pain or stiffness.  Reports dry cough.  No nausea, vomiting, chest pain, shortness of breath, abdominal pain.  She did receive a flu shot this year.  Past Medical History:  Diagnosis Date  . Adenocarcinoma of right lung, stage 1 ([place]) 11/29/2016  . Cancer ([place])   . Chronic pain   . Depression    takes Lexapro daily  . Diabetes mellitus    takes Metformin daily and has an insulin pump.Fasting blood sugar runs250  . GERD (gastroesophageal reflux disease)    takes Pantoprazole daily  . History of colon polyps    benign  . Hyperlipidemia    takes Atorvastatin daily  . Hypertension    takes Amlodipine and Lisinopril daily  . Insomnia    takes Trazodone nightly  . Nocturia   . Thyroid disease   . Urinary frequency   . Urinary urgency     Patient Active Problem List   Diagnosis Date Noted  . Adenocarcinoma of right lung, stage 1 ([place]) 11/29/2016  . S/P lobectomy of lung 10/15/2016  . Hyperlipidemia 10/02/2016  . Lung nodule 10/02/2016  . Adrenal adenoma, right 10/02/2016  . Diabetes ([place]) 06/16/2016  . HTN (hypertension) 05/31/2016  . Change in bowel habits 03/03/2014  . Depression 10/13/2013  . Fractured lateral malleolus 08/12/2012  . Left knee sprain 08/12/2012    Past Surgical History:  Procedure Laterality Date  . ABDOMINAL HYSTERECTOMY    . ABDOMINAL SURGERY    . COLONOSCOPY  2005  [facility]   TICs, IH  . COLONOSCOPY N/A 03/08/2014   Procedure: COLONOSCOPY;  Surgeon: Danie Binder, MD;  [facility];  Service: Endoscopy;  Laterality: N/A;  1015  . CYSTOSTOMY  11/22/2011   Procedure: CYSTOSTOMY SUPRAPUBIC;  Surgeon: Marissa Nestle, MD;  Location: [facility];  Service: Urology;  Laterality: N/A;  . ESOPHAGOGASTRODUODENOSCOPY N/A 03/08/2014   Procedure: ESOPHAGOGASTRODUODENOSCOPY (EGD);  Surgeon: Danie Binder, MD;  [facility];  Service: Endoscopy;  Laterality: N/A;  . ESOPHAGOGASTRODUODENOSCOPY    . HALLUX VALGUS CORRECTION     bilateral foot surgery for bone repairs-multiple  . LOBECTOMY Right 10/15/2016   Procedure: RIGHT UPPER LOBECTOMY;  Surgeon: [name], MD;  Location: [facility];  Service: Thoracic;  Laterality: Right;  . LYMPH NODE DISSECTION Right 10/15/2016   Procedure: LYMPH NODE DISSECTION;  Surgeon: [name], MD;  Location: [place];  Service: Thoracic;  Laterality: Right;  Marland Kitchen VAGINA RECONSTRUCTION SURGERY     tvt 2006- [place]  . VIDEO ASSISTED THORACOSCOPY (VATS)/WEDGE RESECTION Right 10/15/2016   Procedure: RIGHT VIDEO ASSISTED THORACOSCOPY (VATS)/WEDGE RESECTION;  Surgeon: [name], MD;  Location: [facility];  Service: Thoracic;  Laterality: Right;    OB History    Gravida Para Term Preterm AB Living  0   SAB TAB Ectopic Multiple Live Births                   Home Medications    Prior to Admission medications   Medication Sig Start Date End Date Taking? Authorizing Provider  amLODipine (NORVASC) 5 MG tablet Take 5 mg by mouth daily at 6 PM.    Yes [provider]  atorvastatin (LIPITOR) 20 MG tablet Take 20 mg by mouth daily. 04/27/17  Yes [provider]  Calcium Carb-Cholecalciferol (CALCIUM 600+D3 PO) Take 1 tablet by mouth 2 (two) times a week.   Yes [provider]  chlorthalidone (HYGROTON) 25 MG tablet Take 25 mg by mouth as directed. Takes 1/2 tablet daily  11/26/16  Yes [provider]  Dulaglutide (TRULICITY) 0.24 OX/7.3ZH SOPN Inject 0.75 Units into the skin once a week. 05/13/17  Yes Renato Shin, MD  escitalopram (LEXAPRO) 10 MG tablet Take 1 tablet (10 mg total) by mouth every morning. 06/25/17  Yes Cloria Spring, MD  Insulin Disposable Pump (V-GO 40) KIT 1 Device by Does not apply route daily. 02/15/17  Yes Renato Shin, MD  insulin lispro (HUMALOG) 100 UNIT/ML injection For use in pump, total of 70 units per day 12/04/16  Yes Renato Shin, MD  lisinopril (PRINIVIL,ZESTRIL) 40 MG tablet Take 40 mg by mouth daily.   Yes [provider]  metFORMIN (GLUCOPHAGE-XR) 500 MG 24 hr tablet Take 1 tablet (500 mg total) by mouth 2 (two) times daily. 09/18/16  Yes Renato Shin, MD  Omega-3 Fatty Acids (FISH OIL) 600 MG CAPS Take 1,200 mg by mouth at bedtime.   Yes [provider]  ONETOUCH VERIO test strip USE TO TEST BLOOD SUGAR TWICE A DAY 05/01/17  Yes [provider]  pantoprazole (PROTONIX) 40 MG tablet Take 40 mg by mouth daily.   Yes [provider]  traZODone (DESYREL) 100 MG tablet Take 2 tablets (200 mg total) by mouth at bedtime. 06/25/17  Yes Cloria Spring, MD  YUVAFEM 10 MCG TABS vaginal tablet Place 10 mcg vaginally See admin instructions. Twice a week as needed for vaginal dryness 09/29/16  Yes [provider]  acetaminophen (TYLENOL) 500 MG tablet Take 1 tablet (500 mg total) by mouth every 6 (six) hours as needed. 08/07/17   Kenyan Karnes, Barbette Hair, MD    Family History Family History  Problem Relation Age of Onset  . Asthma Unknown   . Diabetes Unknown   . Arthritis Unknown   . Cancer Mother   . Cancer Brother   . Anesthesia problems Neg Hx   . Hypotension Neg Hx   . Malignant hyperthermia Neg Hx   . Pseudochol deficiency Neg Hx   . Colon polyps Neg Hx   . Colon cancer Neg Hx   . Celiac disease Neg Hx   . Pancreatic cancer Neg Hx   . Stomach cancer Neg Hx   . Ulcerative colitis  Neg Hx   . Crohn's disease Neg Hx     Social History Social History   Tobacco Use  . Smoking status: Former Smoker    Packs/day: 1.00    Years: 32.00    Pack years: 32.00    Types: Cigarettes  . Smokeless tobacco: Never Used  . Tobacco comment: quit smoking 15 yrs ago  Substance Use Topics  . Alcohol use: No    Alcohol/week: 0.0 oz    Comment: rarely  . Drug use: No     Allergies  Synthroid [levothyroxine sodium]; Iodine; and Povidone-iodine   Review of Systems Review of Systems  Constitutional: Positive for chills. Negative for fever.  HENT: Positive for sore throat. Negative for congestion and trouble swallowing.   Respiratory: Positive for cough. Negative for shortness of breath.   Cardiovascular: Negative for chest pain.  Gastrointestinal: Negative for abdominal pain, nausea and vomiting.  All other systems reviewed and are negative.    Physical Exam Updated Vital Signs BP 124/76 (BP Location: Left Arm)   Pulse 98   Temp 98.7 F (37.1 C) (Oral)   Resp 18   Ht _0  (1.702 m)   Wt 117.9 kg (260 lb)   SpO2 97%   BMI 40.72 kg/m   Physical Exam  Constitutional: She is oriented to person, place, and time. She appears well-developed and well-nourished.  Overweight, no acute distress  HENT:  Head: Normocephalic and atraumatic.  Mouth/Throat: Mucous membranes are normal. No tonsillar exudate.  Uvula midline, no significant tonsillar swelling, no tonsillar exudate, mild erythema  Eyes: Pupils are equal, round, and reactive to light.  Neck: Neck supple.  Cardiovascular: Normal rate, regular rhythm and normal heart sounds.  Pulmonary/Chest: Effort normal. No respiratory distress. She has no wheezes.  Abdominal: Soft. Bowel sounds are normal.  Lymphadenopathy:    She has no cervical adenopathy.  Neurological: She is alert and oriented to person, place, and time.  Skin: Skin is warm and dry.  Psychiatric: She has a normal mood and affect.  Nursing note and  vitals reviewed.    ED Treatments / Results  Labs (all labs ordered are listed, but only abnormal results are displayed) Labs Reviewed  RAPID STREP SCREEN (NOT AT [facility])  CULTURE, GROUP A STREP [facility])    EKG  EKG Interpretation None       Radiology No results found.  Procedures Procedures (including critical care time)  Medications Ordered in ED Medications - No data to display   Initial Impression / Assessment and Plan / ED Course  I have reviewed the triage vital signs and the nursing notes.  Pertinent labs & imaging results that were available during my care of the patient were reviewed by me and considered in my medical decision making (see chart for details).     Patient presents with 1 day history of sore throat and chills.  She is overall nontoxic appearing.  Vital signs initially notable for tachycardia; however, on my evaluation, she has a normal heart rate.  Recheck of heart rate is 95.  She is afebrile.  Center criteria 1/4.  Strep screen is negative.  Breath sounds are clear.  Would not get imaging at this time as she is clinically well-appearing.  Discussed with patient that this is likely viral in nature.  Supportive measures recommended including hydration, Tylenol for body aches or pain, and salt water gargles.  Patient stated understanding.  After history, exam, and medical workup I feel the patient has been appropriately medically screened and is safe for discharge home. Pertinent diagnoses were discussed with the patient. Patient was given return precautions.   Final Clinical Impressions(s) / ED Diagnoses   Final diagnoses:  Sore throat  Flu-like symptoms    ED Discharge Orders        Ordered    acetaminophen (TYLENOL) 500 MG tablet  Every 6 hours PRN     08/07/17 0539       Merryl Hacker, MD 08/07/17 [phone]

## 2017-08-07 NOTE — ED Triage Notes (Signed)
Patient has generalized body, headache and sore throat starting Tuesday am.

## 2017-08-07 NOTE — Discharge Instructions (Signed)
You were seen today for sore throat and flulike symptoms.  Your strep screen is negative.  Make sure to stay hydrated.  Tylenol as needed for body aches or fevers.  If you develop worsening cough, respiratory symptoms, nausea, vomiting or cannot stay hydrated, you need to be reevaluated.

## 2017-08-09 LAB — CULTURE, GROUP A STREP (THRC)

## 2017-08-13 ENCOUNTER — Ambulatory Visit: Payer: 59 | Admitting: Endocrinology

## 2017-08-13 ENCOUNTER — Encounter: Payer: Self-pay | Admitting: Endocrinology

## 2017-08-13 VITALS — BP 160/86 | HR 101 | Wt 262.8 lb

## 2017-08-13 DIAGNOSIS — Z794 Long term (current) use of insulin: Secondary | ICD-10-CM

## 2017-08-13 DIAGNOSIS — E119 Type 2 diabetes mellitus without complications: Secondary | ICD-10-CM

## 2017-08-13 LAB — POCT GLYCOSYLATED HEMOGLOBIN (HGB A1C): Hemoglobin A1C: 9.1

## 2017-08-13 MED ORDER — DULAGLUTIDE 1.5 MG/0.5ML ~~LOC~~ SOAJ: 1.5000 mg | SUBCUTANEOUS | 11 refills | Status: DC

## 2017-08-13 MED ORDER — DAPAGLIFLOZIN PROPANEDIOL 5 MG PO TABS: 5.0000 mg | ORAL_TABLET | Freq: Every day | ORAL | 11 refills | Status: DC

## 2017-08-13 NOTE — Patient Instructions (Addendum)
Please continue the same metformin, and: increase the X-NA-35 to 6 clicks with each meal, and:  Increase the trulicity, and: Add "farxiga."  check your blood sugar twice a day.  vary the time of day when you check, between before the 3 meals, and at bedtime.  also check if you have symptoms of your blood sugar being too high or too low.  please keep a record of the readings and bring it to your next appointment here (or you can bring the meter itself).  You can write it on any piece of paper.  please call us sooner if your blood sugar goes below 70, or if you have a lot of readings over 200.   Please come back for a follow-up appointment in 2 months.

## 2017-08-13 NOTE — Progress Notes (Signed)
   Subjective:    Patient ID: [name], female    DOB: 02/08/1955, 62 y.o.   MRN: [id number]  HPI Pt returns for f/u of diabetes mellitus: DM type: Insulin-requiring type 2 Dx'ed: 4650 Complications: none Therapy: insulin since soon after dx, metformin, and trulicity.   GDM: never (G0) DKA: never Severe hypoglycemia: never.  Pancreatitis: never Other: she stopped victoza, due to lack of effect; she uses V-GO-40; metformin dosage is limited by nausea.   Interval history: She takes a total of 15 clicks per day, via the V-GO-40.  no cbg record, but states cbg's are over 200, since URI started 8 days ago.  There is no trend throughout the day.    Review of Systems She denies hypoglycemia.      Objective:   Physical Exam VITAL SIGNS:  See vs page GENERAL: no distress Pulses: foot pulses are intact bilaterally.   MSK: no deformity of the feet or ankles.  CV: trace bilat edema of the legs.  Skin:  no ulcer on the feet or ankles.  normal color and temp on the feet and ankles Neuro: sensation is intact to touch on the feet and ankles.   Lab Results  Component Value Date   HGBA1C 9.1 08/13/2017   Lab Results  Component Value Date   CREATININE 0.8 05/29/2017   BUN 13.5 05/29/2017   NA 141 05/29/2017   K 4.8 05/29/2017   CL 102 10/17/2016   CO2 28 05/29/2017       Assessment & Plan:  Insulin-requiring type 2 DM: she needs increased rx, but she is near max dosage of V-GO-40   Patient Instructions  Please continue the same metformin, and: increase the P-TW-65 to 6 clicks with each meal, and:  Increase the trulicity, and: Add "farxiga."  check your blood sugar twice a day.  vary the time of day when you check, between before the 3 meals, and at bedtime.  also check if you have symptoms of your blood sugar being too high or too low.  please keep a record of the readings and bring it to your next appointment here (or you can bring the meter itself).  You can write it on  any piece of paper.  please call us sooner if your blood sugar goes below 70, or if you have a lot of readings over 200.   Please come back for a follow-up appointment in 2 months.

## 2017-08-20 ENCOUNTER — Other Ambulatory Visit: Payer: Self-pay | Admitting: Endocrinology

## 2017-08-27 ENCOUNTER — Other Ambulatory Visit: Payer: Self-pay | Admitting: Endocrinology

## 2017-10-01 ENCOUNTER — Other Ambulatory Visit: Payer: Self-pay | Admitting: Endocrinology

## 2017-10-01 MED ORDER — V-GO 40 KIT: 1.0000 | PACK | Freq: Every day | 3 refills | Status: DC

## 2017-10-11 ENCOUNTER — Ambulatory Visit: Payer: Self-pay | Admitting: Endocrinology

## 2017-10-14 ENCOUNTER — Encounter: Payer: Self-pay | Admitting: Endocrinology

## 2017-10-14 ENCOUNTER — Ambulatory Visit: Payer: 59 | Admitting: Endocrinology

## 2017-10-14 VITALS — BP 136/84 | HR 91 | Ht 66.0 in | Wt 261.0 lb

## 2017-10-14 DIAGNOSIS — E119 Type 2 diabetes mellitus without complications: Secondary | ICD-10-CM

## 2017-10-14 DIAGNOSIS — Z794 Long term (current) use of insulin: Secondary | ICD-10-CM

## 2017-10-14 LAB — HEMOGLOBIN A1C: Hgb A1c MFr Bld: 8.3 % — ABNORMAL HIGH (ref 4.6–6.5)

## 2017-10-14 LAB — BASIC METABOLIC PANEL
BUN: 24 mg/dL — ABNORMAL HIGH (ref 6–23)
CO2: 28 mEq/L (ref 19–32)
Calcium: 9.6 mg/dL (ref 8.4–10.5)
Chloride: 101 mEq/L (ref 96–112)
Creatinine, Ser: 0.88 mg/dL (ref 0.40–1.20)
GFR: 68.61 mL/min (ref >60.00)
Glucose, Bld: 145 mg/dL — ABNORMAL HIGH (ref 70–99)
Potassium: 4.3 mEq/L (ref 3.5–5.1)
Sodium: 137 mEq/L (ref 135–145)

## 2017-10-14 MED ORDER — BROMOCRIPTINE MESYLATE 2.5 MG PO TABS: ORAL_TABLET | ORAL | 3 refills | Status: DC

## 2017-10-14 NOTE — Patient Instructions (Addendum)
Please continue the same medications blood tests are requested for you today.  We'll let you know about the results. check your blood sugar twice a day.  vary the time of day when you check, between before the 3 meals, and at bedtime.  also check if you have symptoms of your blood sugar being too high or too low.  please keep a record of the readings and bring it to your next appointment here (or you can bring the meter itself).  You can write it on any piece of paper.  please call us sooner if your blood sugar goes below 70, or if you have a lot of readings over 200.   Please come back for a follow-up appointment in 2 months.

## 2017-10-14 NOTE — Progress Notes (Signed)
Subjective:    Patient ID: [name], female    DOB: 02/08/1955, 62 y.o.   MRN: [id number]  HPI Pt returns for f/u of diabetes mellitus: DM type: Insulin-requiring type 2.   Dx'ed: 9741 Complications: none Therapy: insulin since soon after dx, metformin, farxiga, and trulicity.     GDM: never (G0) DKA: never Severe hypoglycemia: never.  Pancreatitis: never.  Other: she stopped victoza, due to lack of effect; she uses V-GO-40; metformin dosage has been limited by nausea.  Interval history: She takes a total of 18 clicks per day, via the V-GO-40.  no cbg record, but states cbg's are well-controlled. There is no trend throughout the day.  Nausea is much better recently Past Medical History:  Diagnosis Date  . Adenocarcinoma of right lung, stage 1 ([place]) 11/29/2016  . Cancer ([place])   . Chronic pain   . Depression    takes Lexapro daily  . Diabetes mellitus    takes Metformin daily and has an insulin pump.Fasting blood sugar runs250  . GERD (gastroesophageal reflux disease)    takes Pantoprazole daily  . History of colon polyps    benign  . Hyperlipidemia    takes Atorvastatin daily  . Hypertension    takes Amlodipine and Lisinopril daily  . Insomnia    takes Trazodone nightly  . Nocturia   . Thyroid disease   . Urinary frequency   . Urinary urgency     Past Surgical History:  Procedure Laterality Date  . ABDOMINAL HYSTERECTOMY    . ABDOMINAL SURGERY    . COLONOSCOPY  2005 MAC   TICs, IH  . COLONOSCOPY N/A 03/08/2014   Procedure: COLONOSCOPY;  Surgeon: Danie Binder, MD;  [facility];  Service: Endoscopy;  Laterality: N/A;  1015  . CYSTOSTOMY  11/22/2011   Procedure: CYSTOSTOMY SUPRAPUBIC;  Surgeon: Marissa Nestle, MD;  Location: [facility];  Service: Urology;  Laterality: N/A;  . ESOPHAGOGASTRODUODENOSCOPY N/A 03/08/2014   Procedure: ESOPHAGOGASTRODUODENOSCOPY (EGD);  Surgeon: Danie Binder, MD;  [facility];  Service: Endoscopy;  Laterality:  N/A;  . ESOPHAGOGASTRODUODENOSCOPY    . HALLUX VALGUS CORRECTION     bilateral foot surgery for bone repairs-multiple  . LOBECTOMY Right 10/15/2016   Procedure: RIGHT UPPER LOBECTOMY;  Surgeon: [name], MD;  Location: [facility];  Service: Thoracic;  Laterality: Right;  . LYMPH NODE DISSECTION Right 10/15/2016   Procedure: LYMPH NODE DISSECTION;  Surgeon: [name], MD;  Location: [facility];  Service: Thoracic;  Laterality: Right;  Marland Kitchen VAGINA RECONSTRUCTION SURGERY     tvt 2006- [place]  . VIDEO ASSISTED THORACOSCOPY (VATS)/WEDGE RESECTION Right 10/15/2016   Procedure: RIGHT VIDEO ASSISTED THORACOSCOPY (VATS)/WEDGE RESECTION;  Surgeon: [name], MD;  Location: [facility];  Service: Thoracic;  Laterality: Right;    Social History   Socioeconomic History  . Marital status: Single    Spouse name: Not on file  . Number of children: Not on file  . Years of education: Not on file  . Highest education level: Not on file  Social Needs  . Financial resource strain: Not on file  . Food insecurity - worry: Not on file  . Food insecurity - inability: Not on file  . Transportation needs - medical: Not on file  . Transportation needs - non-medical: Not on file  Occupational History  . Not on file  Tobacco Use  . Smoking status: Former Smoker    Packs/day: 1.00  Years: 32.00    Pack years: 32.00    Types: Cigarettes  . Smokeless tobacco: Never Used  . Tobacco comment: quit smoking 15 yrs ago  Substance and Sexual Activity  . Alcohol use: No    Alcohol/week: 0.0 oz    Comment: rarely  . Drug use: No  . Sexual activity: Not Currently    Birth control/protection: Surgical  Other Topics Concern  . Not on file  Social History Narrative  . Not on file    Current Outpatient Medications on File Prior to Visit  Medication Sig Dispense Refill  . acetaminophen (TYLENOL) 500 MG tablet Take 1 tablet (500 mg total) by mouth every 6 (six) hours as needed. 30 tablet 0    . amLODipine (NORVASC) 5 MG tablet Take 5 mg by mouth daily at 6 PM.     . atorvastatin (LIPITOR) 20 MG tablet Take 20 mg by mouth daily.  11  . Calcium Carb-Cholecalciferol (CALCIUM 600+D3 PO) Take 1 tablet by mouth 2 (two) times a week.    . chlorthalidone (HYGROTON) 25 MG tablet Take 25 mg by mouth as directed. Takes 1/2 tablet daily    . dapagliflozin propanediol (FARXIGA) 5 MG TABS tablet Take 5 mg by mouth daily. 30 tablet 11  . Dulaglutide (TRULICITY) 1.5 YC/1.4GY SOPN Inject 1.5 mg into the skin once a week. 4 pen 11  . escitalopram (LEXAPRO) 10 MG tablet Take 1 tablet (10 mg total) by mouth every morning. 30 tablet 5  . Insulin Disposable Pump (V-GO 40) KIT 1 Device by Does not apply route daily. 90 kit 3  . insulin lispro (HUMALOG) 100 UNIT/ML injection For use in pump, total of 70 units per day 90 mL 3  . lisinopril (PRINIVIL,ZESTRIL) 40 MG tablet Take 40 mg by mouth daily.    . metFORMIN (GLUCOPHAGE-XR) 500 MG 24 hr tablet TAKE 1 TABLET (500 MG TOTAL) BY MOUTH 2 (TWO) TIMES DAILY. 180 tablet 3  . Omega-3 Fatty Acids (FISH OIL) 600 MG CAPS Take 1,200 mg by mouth at bedtime.    Glory Rosebush VERIO test strip USE TO TEST BLOOD SUGAR TWICE A DAY  12  . ONETOUCH VERIO test strip USE TO TEST BLOOD SUGAR TWICE A DAY 100 each 5  . pantoprazole (PROTONIX) 40 MG tablet Take 40 mg by mouth daily.    . traZODone (DESYREL) 100 MG tablet Take 2 tablets (200 mg total) by mouth at bedtime. 60 tablet 5  . YUVAFEM 10 MCG TABS vaginal tablet Place 10 mcg vaginally See admin instructions. Twice a week as needed for vaginal dryness  11   No current facility-administered medications on file prior to visit.     Allergies  Allergen Reactions  . Synthroid [Levothyroxine Sodium] Anaphylaxis  . Iodine Hives  . Povidone-Iodine Itching and Rash    Family History  Problem Relation Age of Onset  . Asthma Unknown   . Diabetes Unknown   . Arthritis Unknown   . Cancer Mother   . Cancer Brother   .  Anesthesia problems Neg Hx   . Hypotension Neg Hx   . Malignant hyperthermia Neg Hx   . Pseudochol deficiency Neg Hx   . Colon polyps Neg Hx   . Colon cancer Neg Hx   . Celiac disease Neg Hx   . Pancreatic cancer Neg Hx   . Stomach cancer Neg Hx   . Ulcerative colitis Neg Hx   . Crohn's disease Neg Hx     BP 136/84  Pulse 91   Ht 5' 6"  (1.676 m)   Wt 261 lb (118.4 kg)   SpO2 94%   BMI 42.13 kg/m    Review of Systems She denies hypoglycemia    Objective:   Physical Exam VITAL SIGNS:  See vs page GENERAL: no distress Pulses: dorsalis pedis intact bilat.   MSK: no deformity of the feet CV: trace bilat leg edema Skin:  no ulcer on the feet.  normal color and temp on the feet. Neuro: sensation is intact to touch on the feet.     Lab Results  Component Value Date   HGBA1C 8.3 (H) 10/14/2017       Assessment & Plan:  Insulin-requiring type 2 DM: she needs increased rx.  I have sent a prescription to your pharmacy, to add bromocriptine

## 2017-11-04 ENCOUNTER — Other Ambulatory Visit: Payer: Self-pay | Admitting: Thoracic Surgery (Cardiothoracic Vascular Surgery)

## 2017-11-04 DIAGNOSIS — C3491 Malignant neoplasm of unspecified part of right bronchus or lung: Secondary | ICD-10-CM

## 2017-11-05 ENCOUNTER — Ambulatory Visit: Payer: 59 | Admitting: Thoracic Surgery (Cardiothoracic Vascular Surgery)

## 2017-11-05 ENCOUNTER — Encounter: Payer: Self-pay | Admitting: Thoracic Surgery (Cardiothoracic Vascular Surgery)

## 2017-11-05 ENCOUNTER — Other Ambulatory Visit: Payer: Self-pay

## 2017-11-05 ENCOUNTER — Ambulatory Visit
Admission: RE | Admit: 2017-11-05 | Discharge: 2017-11-05 | Disposition: A | Discharge status: Home / Self Care | Payer: 59 | Source: Ambulatory Visit | Attending: Thoracic Surgery (Cardiothoracic Vascular Surgery) | Admitting: Thoracic Surgery (Cardiothoracic Vascular Surgery)

## 2017-11-05 VITALS — BP 112/68 | HR 104 | Ht 66.0 in | Wt 261.0 lb

## 2017-11-05 DIAGNOSIS — C3491 Malignant neoplasm of unspecified part of right bronchus or lung: Secondary | ICD-10-CM

## 2017-11-05 DIAGNOSIS — C3411 Malignant neoplasm of upper lobe, right bronchus or lung: Secondary | ICD-10-CM

## 2017-11-05 DIAGNOSIS — Z902 Acquired absence of lung [part of]: Secondary | ICD-10-CM

## 2017-11-05 NOTE — Progress Notes (Signed)
[name] [address]       [name]     HPI: [name] returns for a scheduled one-year follow-up visit  [name] is a 62 year old former smoker with a history of remote tobacco abuse (quit in 2004), obesity, hypertension, hyperlipidemia, insulin dependent diabetes, reflux, and thyroid disease.  She had a low-dose screening scan in February 2018 which showed a 1.5 cm spiculated nodule in the right upper lobe.  On PET CT the nodule was hypermetabolic.  She underwent a thoracoscopic right upper lobectomy and node dissection on 10/15/2016.  It was a stage IA non-small cell carcinoma.  She did well postoperatively.  She saw Dr. Julien Hodge and did not require any adjuvant therapy.  He did a CT in October which showed no evidence of recurrent disease.  She is scheduled to have another CT in May.  She is doing well.  She does not have any pain.  She has not had any respiratory issues.  Appetite is good.  No significant weight loss.  Past Medical History:  Diagnosis Date  . Adenocarcinoma of right lung, stage 1 ([place]) 11/29/2016  . Cancer ([place])   . Chronic pain   . Depression    takes Lexapro daily  . Diabetes mellitus    takes Metformin daily and has an insulin pump.Fasting blood sugar runs250  . GERD (gastroesophageal reflux disease)    takes Pantoprazole daily  . History of colon polyps    benign  . Hyperlipidemia    takes Atorvastatin daily  . Hypertension    takes Amlodipine and Lisinopril daily  . Insomnia    takes Trazodone nightly  . Nocturia   . Thyroid disease   . Urinary frequency   . Urinary urgency     Current Outpatient Medications  Medication Sig Dispense Refill  . acetaminophen (TYLENOL) 500 MG tablet Take 1 tablet (500 mg total) by mouth every 6 (six) hours as needed. 30 tablet 0  . amLODipine (NORVASC) 5 MG tablet Take 5 mg by mouth daily at 6 PM.     . atorvastatin (LIPITOR) 20 MG tablet Take 20 mg by mouth daily.   11  . bromocriptine (PARLODEL) 2.5 MG tablet 1/4 tab daily 25 tablet 3  . Calcium Carb-Cholecalciferol (CALCIUM 600+D3 PO) Take 1 tablet by mouth 2 (two) times a week.    . chlorthalidone (HYGROTON) 25 MG tablet Take 25 mg by mouth as directed. Takes 1/2 tablet daily    . dapagliflozin propanediol (FARXIGA) 5 MG TABS tablet Take 5 mg by mouth daily. 30 tablet 11  . Dulaglutide (TRULICITY) 1.5 HB/7.1IR SOPN Inject 1.5 mg into the skin once a week. 4 pen 11  . escitalopram (LEXAPRO) 10 MG tablet Take 1 tablet (10 mg total) by mouth every morning. 30 tablet 5  . Insulin Disposable Pump (V-GO 40) KIT 1 Device by Does not apply route daily. 90 kit 3  . insulin lispro (HUMALOG) 100 UNIT/ML injection For use in pump, total of 70 units per day 90 mL 3  . lisinopril (PRINIVIL,ZESTRIL) 40 MG tablet Take 40 mg by mouth daily.    . metFORMIN (GLUCOPHAGE-XR) 500 MG 24 hr tablet TAKE 1 TABLET (500 MG TOTAL) BY MOUTH 2 (TWO) TIMES DAILY. 180 tablet 3  . Omega-3 Fatty Acids (FISH OIL) 600 MG CAPS Take 1,200 mg by mouth at bedtime.    [name] VERIO test  strip USE TO TEST BLOOD SUGAR TWICE A DAY 100 each 5  . pantoprazole (PROTONIX) 40 MG tablet Take 40 mg by mouth daily.    . traZODone (DESYREL) 100 MG tablet Take 2 tablets (200 mg total) by mouth at bedtime. 60 tablet 5  . YUVAFEM 10 MCG TABS vaginal tablet Place 10 mcg vaginally See admin instructions. Twice a week as needed for vaginal dryness  11   No current facility-administered medications for this visit.     Physical Exam BP 112/68 (BP Location: Right Arm, Patient Position: Sitting, Cuff Size: Large)   Pulse (!) 104   Ht '5\' 6"'  (1.676 m)   Wt 261 lb (118.4 kg)   SpO2 97% Comment: ON RA  BMI 42.48 kg/m  62 year old woman in no acute distress Alert and oriented x3 with no focal deficits Lungs clear with equal breath sounds bilaterally Incisions well-healed Cardiac regular rate and rhythm normal S1-S2 No cervical or supraclavicular  adenopathy  Diagnostic Tests: CHEST - 2 VIEW  COMPARISON:  CT chest of 05/29/2017 and chest x-ray 01/08/2017  FINDINGS: Changes of prior right upper lobectomy again are noted. The lungs are relatively well aerated. No evidence of metastatic involvement of the lungs is seen and no recurrence is noted. Mediastinal and hilar contours are unremarkable. The heart is within normal limits in size. No acute bony abnormality is seen.  IMPRESSION: 1. No evidence of recurrence of lung carcinoma. 2. The lungs appear well aerated. Changes of prior right upper lobectomy are noted.   Electronically Signed   By: [name] M.D.   On: 11/05/2017 10:00 I personally reviewed the chest x-ray images and concur with the findings noted above  Impression: [name] is a 62 year old former smoker who had a thoracoscopic right upper lobectomy for a stage IA non-small cell carcinoma about a year ago.  He did not require adjuvant therapy.  She is doing well with no evidence of recurrent disease.  She quit smoking back in 2004.  Plan: Follow-up as scheduled with Dr. Julien Hodge.  I would be happy to see [name] back at anytime in the future if I can be of any further assistance with her care  Molly Nakayama, MD [facility] [phone]

## 2017-11-29 ENCOUNTER — Ambulatory Visit (HOSPITAL_COMMUNITY)
Admission: RE | Admit: 2017-11-29 | Discharge: 2017-11-29 | Disposition: A | Discharge status: Home / Self Care | Payer: 59 | Source: Ambulatory Visit | Attending: Internal Medicine | Admitting: Internal Medicine

## 2017-11-29 ENCOUNTER — Inpatient Hospital Stay: Payer: 59 | Attending: Internal Medicine

## 2017-11-29 DIAGNOSIS — E079 Disorder of thyroid, unspecified: Secondary | ICD-10-CM | POA: Insufficient documentation

## 2017-11-29 DIAGNOSIS — C3490 Malignant neoplasm of unspecified part of unspecified bronchus or lung: Secondary | ICD-10-CM

## 2017-11-29 DIAGNOSIS — E119 Type 2 diabetes mellitus without complications: Secondary | ICD-10-CM | POA: Diagnosis not present

## 2017-11-29 DIAGNOSIS — I10 Essential (primary) hypertension: Secondary | ICD-10-CM | POA: Diagnosis not present

## 2017-11-29 DIAGNOSIS — F329 Major depressive disorder, single episode, unspecified: Secondary | ICD-10-CM | POA: Insufficient documentation

## 2017-11-29 DIAGNOSIS — Z79899 Other long term (current) drug therapy: Secondary | ICD-10-CM | POA: Diagnosis not present

## 2017-11-29 DIAGNOSIS — K219 Gastro-esophageal reflux disease without esophagitis: Secondary | ICD-10-CM | POA: Insufficient documentation

## 2017-11-29 DIAGNOSIS — Z902 Acquired absence of lung [part of]: Secondary | ICD-10-CM | POA: Diagnosis not present

## 2017-11-29 DIAGNOSIS — Z86010 Personal history of colonic polyps: Secondary | ICD-10-CM | POA: Insufficient documentation

## 2017-11-29 DIAGNOSIS — G8929 Other chronic pain: Secondary | ICD-10-CM | POA: Insufficient documentation

## 2017-11-29 DIAGNOSIS — Z87891 Personal history of nicotine dependence: Secondary | ICD-10-CM | POA: Insufficient documentation

## 2017-11-29 DIAGNOSIS — I700 Atherosclerosis of aorta: Secondary | ICD-10-CM | POA: Insufficient documentation

## 2017-11-29 DIAGNOSIS — E785 Hyperlipidemia, unspecified: Secondary | ICD-10-CM | POA: Insufficient documentation

## 2017-11-29 DIAGNOSIS — Z794 Long term (current) use of insulin: Secondary | ICD-10-CM | POA: Insufficient documentation

## 2017-11-29 DIAGNOSIS — Z85118 Personal history of other malignant neoplasm of bronchus and lung: Secondary | ICD-10-CM | POA: Insufficient documentation

## 2017-11-29 DIAGNOSIS — G4700 Insomnia, unspecified: Secondary | ICD-10-CM | POA: Diagnosis not present

## 2017-11-29 LAB — CBC WITH DIFFERENTIAL/PLATELET
Basophils Absolute: 0.1 10*3/uL (ref 0.0–0.1)
Basophils Relative: 1 %
Eosinophils Absolute: 0.5 10*3/uL (ref 0.0–0.5)
Eosinophils Relative: 6 %
HCT: 40.2 % (ref 34.8–46.6)
Hemoglobin: 13 g/dL (ref 11.6–15.9)
Lymphocytes Relative: 18 %
Lymphs Abs: 1.7 10*3/uL (ref 0.9–3.3)
MCH: 25.1 pg (ref 25.1–34.0)
MCHC: 32.5 g/dL (ref 31.5–36.0)
MCV: 77.3 fL — ABNORMAL LOW (ref 79.5–101.0)
Monocytes Absolute: 0.4 10*3/uL (ref 0.1–0.9)
Monocytes Relative: 4 %
Neutro Abs: 6.6 10*3/uL — ABNORMAL HIGH (ref 1.5–6.5)
Neutrophils Relative %: 71 %
Platelets: 224 10*3/uL (ref 145–400)
RBC: 5.2 MIL/uL (ref 3.70–5.45)
RDW: 14.9 % — ABNORMAL HIGH (ref 11.2–14.5)
WBC: 9.2 10*3/uL (ref 3.9–10.3)

## 2017-11-29 LAB — COMPREHENSIVE METABOLIC PANEL
ALT: 27 U/L (ref 0–55)
AST: 18 U/L (ref 5–34)
Albumin: 3.7 g/dL (ref 3.5–5.0)
Alkaline Phosphatase: 97 U/L (ref 40–150)
Anion gap: 7 (ref 3–11)
BUN: 21 mg/dL (ref 7–26)
CO2: 26 mmol/L (ref 22–29)
Calcium: 9.6 mg/dL (ref 8.4–10.4)
Chloride: 106 mmol/L (ref 98–109)
Creatinine, Ser: 0.84 mg/dL (ref 0.60–1.10)
GFR calc Af Amer: >60 mL/min (ref >60)
GFR calc non Af Amer: >60 mL/min (ref >60)
Glucose, Bld: 127 mg/dL (ref 70–140)
Potassium: 4.3 mmol/L (ref 3.5–5.1)
Sodium: 139 mmol/L (ref 136–145)
Total Bilirubin: 0.3 mg/dL (ref 0.2–1.2)
Total Protein: 7 g/dL (ref 6.4–8.3)

## 2017-11-29 MED ORDER — IOHEXOL 300 MG/ML  SOLN
75.0000 mL | Freq: Once | INTRAMUSCULAR | Status: AC | PRN
  Administered 2017-11-29: 75 mL via INTRAVENOUS

## 2017-11-30 ENCOUNTER — Other Ambulatory Visit: Payer: Self-pay | Admitting: Endocrinology

## 2017-12-03 ENCOUNTER — Other Ambulatory Visit: Payer: Self-pay | Admitting: Endocrinology

## 2017-12-05 ENCOUNTER — Encounter: Payer: Self-pay | Admitting: Internal Medicine

## 2017-12-05 ENCOUNTER — Inpatient Hospital Stay: Payer: 59 | Admitting: Internal Medicine

## 2017-12-05 ENCOUNTER — Telehealth: Payer: Self-pay | Admitting: Internal Medicine

## 2017-12-05 DIAGNOSIS — I10 Essential (primary) hypertension: Secondary | ICD-10-CM

## 2017-12-05 DIAGNOSIS — F329 Major depressive disorder, single episode, unspecified: Secondary | ICD-10-CM | POA: Diagnosis not present

## 2017-12-05 DIAGNOSIS — K219 Gastro-esophageal reflux disease without esophagitis: Secondary | ICD-10-CM

## 2017-12-05 DIAGNOSIS — Z87891 Personal history of nicotine dependence: Secondary | ICD-10-CM

## 2017-12-05 DIAGNOSIS — Z86010 Personal history of colonic polyps: Secondary | ICD-10-CM | POA: Diagnosis not present

## 2017-12-05 DIAGNOSIS — Z794 Long term (current) use of insulin: Secondary | ICD-10-CM | POA: Diagnosis not present

## 2017-12-05 DIAGNOSIS — E119 Type 2 diabetes mellitus without complications: Secondary | ICD-10-CM | POA: Diagnosis not present

## 2017-12-05 DIAGNOSIS — G4700 Insomnia, unspecified: Secondary | ICD-10-CM | POA: Diagnosis not present

## 2017-12-05 DIAGNOSIS — E785 Hyperlipidemia, unspecified: Secondary | ICD-10-CM | POA: Diagnosis not present

## 2017-12-05 DIAGNOSIS — E079 Disorder of thyroid, unspecified: Secondary | ICD-10-CM | POA: Diagnosis not present

## 2017-12-05 DIAGNOSIS — G8929 Other chronic pain: Secondary | ICD-10-CM | POA: Diagnosis not present

## 2017-12-05 DIAGNOSIS — Z85118 Personal history of other malignant neoplasm of bronchus and lung: Secondary | ICD-10-CM | POA: Diagnosis not present

## 2017-12-05 DIAGNOSIS — Z79899 Other long term (current) drug therapy: Secondary | ICD-10-CM

## 2017-12-05 DIAGNOSIS — I700 Atherosclerosis of aorta: Secondary | ICD-10-CM

## 2017-12-05 DIAGNOSIS — C3490 Malignant neoplasm of unspecified part of unspecified bronchus or lung: Secondary | ICD-10-CM

## 2017-12-05 NOTE — Telephone Encounter (Signed)
Appts scheduled AVS/Calendar printed per 5/9 los

## 2017-12-05 NOTE — Progress Notes (Signed)
[place] [phone]) [phone]   Fax:(336) [phone]  OFFICE PROGRESS NOTE  [place]oir[place], D[place], MD [address] [place] [address]  DIAGNOSIS: Stage IA (T1a, N0, M0) non-small cell lung cancer, adenocarcinoma presented with right upper lobe pulmonary nodule   PRIOR THERAPY: S/P right VATS with right upper lobectomy and mediastinal lymph node dissection under the care of Dr. Roxan Hockey on October 15, 2016.  CURRENT THERAPY: Observation.  INTERVAL HISTORY: [name] 62 y.o. female returns to the clinic today for six-month follow-up visit.  The patient has no complaints.  She denied having any chest pain, shortness of breath, cough or hemoptysis.  She denied having any fever or chills.  She has no nausea, vomiting, diarrhea or constipation.  She had a repeat CT scan of the chest performed recently and she is here for evaluation and discussion of her scan results.  MEDICAL HISTORY: Past Medical History:  Diagnosis Date  . Adenocarcinoma of right lung, stage 1 ([place]) 11/29/2016  . Cancer ([place])   . Chronic pain   . Depression    takes Lexapro daily  . Diabetes mellitus    takes Metformin daily and has an insulin pump.Fasting blood sugar runs250  . GERD (gastroesophageal reflux disease)    takes Pantoprazole daily  . History of colon polyps    benign  . Hyperlipidemia    takes Atorvastatin daily  . Hypertension    takes Amlodipine and Lisinopril daily  . Insomnia    takes Trazodone nightly  . Nocturia   . Thyroid disease   . Urinary frequency   . Urinary urgency     ALLERGIES:  is allergic to synthroid [levothyroxine sodium]; iodine; and povidone-iodine.  MEDICATIONS:  Current Outpatient Medications  Medication Sig Dispense Refill  . acetaminophen (TYLENOL) 500 MG tablet Take 1 tablet (500 mg total) by mouth every 6 (six) hours as needed. 30 tablet 0  . amLODipine (NORVASC) 5 MG tablet Take 5 mg by mouth daily at 6 PM.     .  atorvastatin (LIPITOR) 20 MG tablet Take 20 mg by mouth daily.  11  . bromocriptine (PARLODEL) 2.5 MG tablet 1/4 tab daily 25 tablet 3  . Calcium Carb-Cholecalciferol (CALCIUM 600+D3 PO) Take 1 tablet by mouth 2 (two) times a week.    . chlorthalidone (HYGROTON) 25 MG tablet Take 25 mg by mouth as directed. Takes 1/2 tablet daily    . dapagliflozin propanediol (FARXIGA) 5 MG TABS tablet Take 5 mg by mouth daily. 30 tablet 11  . Dulaglutide (TRULICITY) 1.5 RK/2.7CW SOPN Inject 1.5 mg into the skin once a week. 4 pen 11  . escitalopram (LEXAPRO) 10 MG tablet Take 1 tablet (10 mg total) by mouth every morning. 30 tablet 5  . Insulin Disposable Pump (V-GO 40) KIT 1 Device by Does not apply route daily. 90 kit 3  . insulin lispro (HUMALOG) 100 UNIT/ML injection For use in pump, total of 70 units per day 90 mL 3  . insulin lispro (HUMALOG) 100 UNIT/ML injection FOR USE IN PUMP, TOTAL OF 70 UNITS PER DAY 30 mL 7  . lisinopril (PRINIVIL,ZESTRIL) 40 MG tablet Take 40 mg by mouth daily.    . metFORMIN (GLUCOPHAGE-XR) 500 MG 24 hr tablet TAKE 1 TABLET (500 MG TOTAL) BY MOUTH 2 (TWO) TIMES DAILY. 180 tablet 3  . Omega-3 Fatty Acids (FISH OIL) 600 MG CAPS Take 1,200 mg by mouth at bedtime.    Glory Rosebush VERIO  test strip USE TO TEST BLOOD SUGAR TWICE A DAY 100 each 5  . pantoprazole (PROTONIX) 40 MG tablet Take 40 mg by mouth daily.    . traZODone (DESYREL) 100 MG tablet Take 2 tablets (200 mg total) by mouth at bedtime. 60 tablet 5  . YUVAFEM 10 MCG TABS vaginal tablet Place 10 mcg vaginally See admin instructions. Twice a week as needed for vaginal dryness  11   No current facility-administered medications for this visit.     SURGICAL HISTORY:  Past Surgical History:  Procedure Laterality Date  . ABDOMINAL HYSTERECTOMY    . ABDOMINAL SURGERY    . COLONOSCOPY  2005 MAC   TICs, IH  . COLONOSCOPY N/A 03/08/2014   Procedure: COLONOSCOPY;  Surgeon: Danie Binder, MD;  [facility];  Service:  Endoscopy;  Laterality: N/A;  1015  . CYSTOSTOMY  11/22/2011   Procedure: CYSTOSTOMY SUPRAPUBIC;  Surgeon: Marissa Nestle, MD;  Location: [facility];  Service: Urology;  Laterality: N/A;  . ESOPHAGOGASTRODUODENOSCOPY N/A 03/08/2014   Procedure: ESOPHAGOGASTRODUODENOSCOPY (EGD);  Surgeon: Danie Binder, MD;  [facility];  Service: Endoscopy;  Laterality: N/A;  . ESOPHAGOGASTRODUODENOSCOPY    . HALLUX VALGUS CORRECTION     bilateral foot surgery for bone repairs-multiple  . LOBECTOMY Right 10/15/2016   Procedure: RIGHT UPPER LOBECTOMY;  Surgeon: [name], MD;  Location: [facility];  Service: Thoracic;  Laterality: Right;  . LYMPH NODE DISSECTION Right 10/15/2016   Procedure: LYMPH NODE DISSECTION;  Surgeon: [name], MD;  Location: [facility];  Service: Thoracic;  Laterality: Right;  Marland Kitchen VAGINA RECONSTRUCTION SURGERY     tvt 2006- [place]  . VIDEO ASSISTED THORACOSCOPY (VATS)/WEDGE RESECTION Right 10/15/2016   Procedure: RIGHT VIDEO ASSISTED THORACOSCOPY (VATS)/WEDGE RESECTION;  Surgeon: [name], MD;  Location: [facility];  Service: Thoracic;  Laterality: Right;    REVIEW OF SYSTEMS:  A comprehensive review of systems was negative.   PHYSICAL EXAMINATION: General appearance: alert, cooperative and no distress Head: Normocephalic, without obvious abnormality, atraumatic Neck: no adenopathy, no JVD, supple, symmetrical, trachea midline and thyroid not enlarged, symmetric, no tenderness/mass/nodules Lymph nodes: Cervical, supraclavicular, and axillary nodes normal. Resp: clear to auscultation bilaterally Back: symmetric, no curvature. ROM normal. No CVA tenderness. Cardio: regular rate and rhythm, S1, S2 normal, no murmur, click, rub or gallop GI: soft, non-tender; bowel sounds normal; no masses,  no organomegaly Extremities: extremities normal, atraumatic, no cyanosis or edema  ECOG PERFORMANCE STATUS: 1 - Symptomatic but completely ambulatory  Blood  pressure 140/88, pulse (!) 49, temperature 98 F (36.7 C), temperature source Oral, resp. rate 18, height 5' 6"  (1.676 m), weight 257 lb (116.6 kg), SpO2 96 %.  LABORATORY DATA: Lab Results  Component Value Date   WBC 9.2 11/29/2017   HGB 13.0 11/29/2017   HCT 40.2 11/29/2017   MCV 77.3 (L) 11/29/2017   PLT 224 11/29/2017      Chemistry      Component Value Date/Time   NA 139 11/29/2017 0745   NA 141 05/29/2017 0921   K 4.3 11/29/2017 0745   K 4.8 05/29/2017 0921   CL 106 11/29/2017 0745   CO2 26 11/29/2017 0745   CO2 28 05/29/2017 0921   BUN 21 11/29/2017 0745   BUN 13.5 05/29/2017 0921   CREATININE 0.84 11/29/2017 0745   CREATININE 0.8 05/29/2017 0921      Component Value Date/Time   CALCIUM 9.6 11/29/2017 0745   CALCIUM 9.5  05/29/2017 0921   ALKPHOS 97 11/29/2017 0745   ALKPHOS 104 05/29/2017 0921   AST 18 11/29/2017 0745   AST 17 05/29/2017 0921   ALT 27 11/29/2017 0745   ALT 20 05/29/2017 0921   BILITOT 0.3 11/29/2017 0745   BILITOT 0.40 05/29/2017 0921       RADIOGRAPHIC STUDIES: Dg Chest 2 View  Result Date: 11/05/2017 CLINICAL DATA:  History of VATS for lung carcinoma, former smoking history EXAM: CHEST - 2 VIEW COMPARISON:  CT chest of 05/29/2017 and chest x-ray 01/08/2017 FINDINGS: Changes of prior right upper lobectomy again are noted. The lungs are relatively well aerated. No evidence of metastatic involvement of the lungs is seen and no recurrence is noted. Mediastinal and hilar contours are unremarkable. The heart is within normal limits in size. No acute bony abnormality is seen. IMPRESSION: 1. No evidence of recurrence of lung carcinoma. 2. The lungs appear well aerated. Changes of prior right upper lobectomy are noted. Electronically Signed   By: Ivar Drape M.D.   On: 11/05/2017 10:00   Ct Chest W Contrast  Result Date: 11/29/2017 CLINICAL DATA:  Followup lung cancer. EXAM: CT CHEST WITH CONTRAST TECHNIQUE: Multidetector CT imaging of the chest was  performed during intravenous contrast administration. CONTRAST:  76m OMNIPAQUE IOHEXOL 300 MG/ML  SOLN COMPARISON:  05/29/2017 FINDINGS: Cardiovascular: The heart size appears within normal limits. No pericardial effusion. Aortic atherosclerosis. Mediastinum/Nodes: Normal appearance of the thyroid gland. The trachea appears patent and is midline. Normal appearance of the esophagus. No enlarged mediastinal or hilar lymph nodes. Lungs/Pleura: No pleural effusion. Status post right upper lobectomy. No findings to suggest local tumor recurrence within the right lung. No suspicious pulmonary nodules or masses identified. Stable 3 mm left upper lobe lung nodule, image 35/5. Upper Abdomen: No acute abnormality identified. The index left adrenal nodule is unchanged measuring 1.5 cm. The right adrenal nodule is stable measuring 1.8 cm. Musculoskeletal: No suspicious bone lesions identified. IMPRESSION: 1. Stable CT of the chest status post right upper lobectomy. No evidence for recurrence of tumor or metastatic disease within the chest. 2.  Aortic Atherosclerosis (ICD10-I70.0). Electronically Signed   By: TKerby MoorsM.D.   On: 11/29/2017 12:39    ASSESSMENT AND PLAN: This is a very pleasant 62years old white female with stage Ia non-small cell lung cancer status post right upper lobectomy with lymph node dissection and currently on observation. The patient continues to do well with no concerning complaints. Repeat CT scan of the chest showed no concerning findings for disease recurrence or metastasis. I discussed the scan results with the patient and recommended for her to continue on observation with repeat CT scan of the chest in 6 months. She was advised to call immediately if she has any concerning symptoms in the interval. The patient voices understanding of current disease status and treatment options and is in agreement with the current care plan. All questions were answered. The patient knows to call  the clinic with any problems, questions or concerns. We can certainly see the patient much sooner if necessary.  I spent 10 minutes counseling the patient face to face. The total time spent in the appointment was 15 minutes.  Disclaimer: This note was dictated with voice recognition software. Similar sounding words can inadvertently be transcribed and may not be corrected upon review.

## 2017-12-09 ENCOUNTER — Telehealth: Payer: Self-pay | Admitting: Medical Oncology

## 2017-12-09 NOTE — Telephone Encounter (Signed)
Read CT scan . Concerned about finding " Stable 3 mm left upper lobe lung nodule, image 35/5."

## 2017-12-09 NOTE — Telephone Encounter (Signed)
It is a tiny nodule that did not change from the previous scan.  Nothing need to be done at this point just monitoring.

## 2017-12-13 ENCOUNTER — Encounter: Payer: Self-pay | Admitting: Endocrinology

## 2017-12-13 ENCOUNTER — Ambulatory Visit: Payer: 59 | Admitting: Endocrinology

## 2017-12-13 VITALS — BP 132/88 | HR 86 | Wt 259.4 lb

## 2017-12-13 DIAGNOSIS — E119 Type 2 diabetes mellitus without complications: Secondary | ICD-10-CM

## 2017-12-13 DIAGNOSIS — Z794 Long term (current) use of insulin: Secondary | ICD-10-CM

## 2017-12-13 LAB — POCT GLYCOSYLATED HEMOGLOBIN (HGB A1C): Hemoglobin A1C: 7.5

## 2017-12-13 MED ORDER — DAPAGLIFLOZIN PROPANEDIOL 10 MG PO TABS: 10.0000 mg | ORAL_TABLET | Freq: Every day | ORAL | 11 refills | Status: DC

## 2017-12-13 NOTE — Progress Notes (Signed)
Subjective:    Patient ID: [name], female    DOB: 1955-08-03, 62 y.o.   MRN: [id number]  HPI Pt returns for f/u of diabetes mellitus: DM type: Insulin-requiring type 2.   Dx'ed: 7591 Complications: none Therapy: insulin since soon after dx, metformin, farxiga, bromocriptine, and trulicity.     GDM: never (G0) DKA: never Severe hypoglycemia: never.  Pancreatitis: never.  Other: she stopped victoza, due to lack of effect; she uses V-GO-40; metformin dosage has been limited by nausea.  Interval history: She takes a total of 18 clicks per day, via the V-GO-40.  no cbg record, but states cbg's vary from 140-200. There is no trend throughout the day.  pt states she feels well in general, except bromocriptine causes drowsiness.   Past Medical History:  Diagnosis Date  . Adenocarcinoma of right lung, stage 1 ([facility]) 11/29/2016  . Cancer ([facility])   . Chronic pain   . Depression    takes Lexapro daily  . Diabetes mellitus    takes Metformin daily and has an insulin pump.Fasting blood sugar runs250  . GERD (gastroesophageal reflux disease)    takes Pantoprazole daily  . History of colon polyps    benign  . Hyperlipidemia    takes Atorvastatin daily  . Hypertension    takes Amlodipine and Lisinopril daily  . Insomnia    takes Trazodone nightly  . Nocturia   . Thyroid disease   . Urinary frequency   . Urinary urgency     Past Surgical History:  Procedure Laterality Date  . ABDOMINAL HYSTERECTOMY    . ABDOMINAL SURGERY    . COLONOSCOPY  2005 MAC   TICs, IH  . COLONOSCOPY N/A 03/08/2014   Procedure: COLONOSCOPY;  Surgeon: Danie Binder, MD;  [facility];  Service: Endoscopy;  Laterality: N/A;  1015  . CYSTOSTOMY  11/22/2011   Procedure: CYSTOSTOMY SUPRAPUBIC;  Surgeon: Marissa Nestle, MD;  [facility];  Service: Urology;  Laterality: N/A;  . ESOPHAGOGASTRODUODENOSCOPY N/A 03/08/2014   Procedure: ESOPHAGOGASTRODUODENOSCOPY (EGD);  Surgeon: Danie Binder,  MD;  [facility];  Service: Endoscopy;  Laterality: N/A;  . ESOPHAGOGASTRODUODENOSCOPY    . HALLUX VALGUS CORRECTION     bilateral foot surgery for bone repairs-multiple  . LOBECTOMY Right 10/15/2016   Procedure: RIGHT UPPER LOBECTOMY;  Surgeon: [name], MD;  Location: [facility];  Service: Thoracic;  Laterality: Right;  . LYMPH NODE DISSECTION Right 10/15/2016   Procedure: LYMPH NODE DISSECTION;  Surgeon: [name], MD;  Location: [facility];  Service: Thoracic;  Laterality: Right;  Marland Kitchen VAGINA RECONSTRUCTION SURGERY     tvt 2006- [place]  . VIDEO ASSISTED THORACOSCOPY (VATS)/WEDGE RESECTION Right 10/15/2016   Procedure: RIGHT VIDEO ASSISTED THORACOSCOPY (VATS)/WEDGE RESECTION;  Surgeon: [name], MD;  Location: [facility];  Service: Thoracic;  Laterality: Right;    Social History   Socioeconomic History  . Marital status: Single    Spouse name: Not on file  . Number of children: Not on file  . Years of education: Not on file  . Highest education level: Not on file  Occupational History  . Not on file  Social Needs  . Financial resource strain: Not on file  . Food insecurity:    Worry: Not on file    Inability: Not on file  . Transportation needs:    Medical: Not on file    Non-medical: Not on file  Tobacco Use  .  Smoking status: Former Smoker    Packs/day: 1.00    Years: 32.00    Pack years: 32.00    Types: Cigarettes  . Smokeless tobacco: Never Used  . Tobacco comment: quit smoking 15 yrs ago  Substance and Sexual Activity  . Alcohol use: No    Alcohol/week: 0.0 oz    Comment: rarely  . Drug use: No  . Sexual activity: Not Currently    Birth control/protection: Surgical  Lifestyle  . Physical activity:    Days per week: Not on file    Minutes per session: Not on file  . Stress: Not on file  Relationships  . Social connections:    Talks on phone: Not on file    Gets together: Not on file    Attends religious service: Not on  file    Active member of club or organization: Not on file    Attends meetings of clubs or organizations: Not on file    Relationship status: Not on file  . Intimate partner violence:    Fear of current or ex partner: Not on file    Emotionally abused: Not on file    Physically abused: Not on file    Forced sexual activity: Not on file  Other Topics Concern  . Not on file  Social History Narrative  . Not on file    Current Outpatient Medications on File Prior to Visit  Medication Sig Dispense Refill  . acetaminophen (TYLENOL) 500 MG tablet Take 1 tablet (500 mg total) by mouth every 6 (six) hours as needed. 30 tablet 0  . amLODipine (NORVASC) 5 MG tablet Take 5 mg by mouth daily at 6 PM.     . atorvastatin (LIPITOR) 20 MG tablet Take 20 mg by mouth daily.  11  . bromocriptine (PARLODEL) 2.5 MG tablet 1/4 tab daily 25 tablet 3  . Calcium Carb-Cholecalciferol (CALCIUM 600+D3 PO) Take 1 tablet by mouth 2 (two) times a week.    . chlorthalidone (HYGROTON) 25 MG tablet Take 25 mg by mouth as directed. Takes 1/2 tablet daily    . Dulaglutide (TRULICITY) 1.5 SN/0.5LZ SOPN Inject 1.5 mg into the skin once a week. 4 pen 11  . escitalopram (LEXAPRO) 10 MG tablet Take 1 tablet (10 mg total) by mouth every morning. 30 tablet 5  . Insulin Disposable Pump (V-GO 40) KIT 1 Device by Does not apply route daily. 90 kit 3  . insulin lispro (HUMALOG) 100 UNIT/ML injection For use in pump, total of 70 units per day 90 mL 3  . lisinopril (PRINIVIL,ZESTRIL) 40 MG tablet Take 40 mg by mouth daily.    . metFORMIN (GLUCOPHAGE-XR) 500 MG 24 hr tablet TAKE 1 TABLET (500 MG TOTAL) BY MOUTH 2 (TWO) TIMES DAILY. 180 tablet 3  . Omega-3 Fatty Acids (FISH OIL) 600 MG CAPS Take 1,200 mg by mouth at bedtime.    Glory Rosebush VERIO test strip USE TO TEST BLOOD SUGAR TWICE A DAY 100 each 5  . pantoprazole (PROTONIX) 40 MG tablet Take 40 mg by mouth daily.    . traZODone (DESYREL) 100 MG tablet Take 2 tablets (200 mg total)  by mouth at bedtime. 60 tablet 5  . YUVAFEM 10 MCG TABS vaginal tablet Place 10 mcg vaginally See admin instructions. Twice a week as needed for vaginal dryness  11   No current facility-administered medications on file prior to visit.     Allergies  Allergen Reactions  . Synthroid [Levothyroxine Sodium] Anaphylaxis  .  Iodine Hives  . Povidone-Iodine Itching and Rash    Family History  Problem Relation Age of Onset  . Asthma Unknown   . Diabetes Unknown   . Arthritis Unknown   . Cancer Mother   . Cancer Brother   . Anesthesia problems Neg Hx   . Hypotension Neg Hx   . Malignant hyperthermia Neg Hx   . Pseudochol deficiency Neg Hx   . Colon polyps Neg Hx   . Colon cancer Neg Hx   . Celiac disease Neg Hx   . Pancreatic cancer Neg Hx   . Stomach cancer Neg Hx   . Ulcerative colitis Neg Hx   . Crohn's disease Neg Hx     BP 132/88   Pulse 86   Wt 259 lb 6.4 oz (117.7 kg)   SpO2 96%   BMI 41.87 kg/m    Review of Systems She denies hypoglycemia.      Objective:   Physical Exam VITAL SIGNS:  See vs page.  GENERAL: no distress. Pulses: dorsalis pedis intact bilat.   MSK: no deformity of the feet.  CV: trace bilat leg edema.  Skin:  no ulcer on the feet.  normal color and temp on the feet.  Neuro: sensation is intact to touch on the feet.    Lab Results  Component Value Date   CREATININE 0.84 11/29/2017   BUN 21 11/29/2017   NA 139 11/29/2017   K 4.3 11/29/2017   CL 106 11/29/2017   CO2 26 11/29/2017   A1c=7.5%    Assessment & Plan:  Type 2 DM: she is at the max dosage for V-GO, so we'll increase non-insulin rx.  Drowsiness, new.  This limits rx options.   Edema: this also limits rx options.   Patient Instructions  I have sent a prescription to your pharmacy, to double the [place]. Please continue the same other diabetes medications blood tests are requested for you today.  We'll let you know about the results. check your blood sugar twice a day.  vary  the time of day when you check, between before the 3 meals, and at bedtime.  also check if you have symptoms of your blood sugar being too high or too low.  please keep a record of the readings and bring it to your next appointment here (or you can bring the meter itself).  You can write it on any piece of paper.  please call us sooner if your blood sugar goes below 70, or if you have a lot of readings over 200.   Please come back for a follow-up appointment in 3 months.

## 2017-12-13 NOTE — Patient Instructions (Addendum)
I have sent a prescription to your pharmacy, to double the [place]. Please continue the same other diabetes medications blood tests are requested for you today.  We'll let you know about the results. check your blood sugar twice a day.  vary the time of day when you check, between before the 3 meals, and at bedtime.  also check if you have symptoms of your blood sugar being too high or too low.  please keep a record of the readings and bring it to your next appointment here (or you can bring the meter itself).  You can write it on any piece of paper.  please call us sooner if your blood sugar goes below 70, or if you have a lot of readings over 200.   Please come back for a follow-up appointment in 3 months.

## 2017-12-20 ENCOUNTER — Encounter (HOSPITAL_COMMUNITY): Payer: Self-pay | Admitting: Psychiatry

## 2017-12-20 ENCOUNTER — Ambulatory Visit (INDEPENDENT_AMBULATORY_CARE_PROVIDER_SITE_OTHER): Payer: 59 | Admitting: Psychiatry

## 2017-12-20 VITALS — BP 116/83 | HR 82 | Ht 66.0 in | Wt 259.0 lb

## 2017-12-20 DIAGNOSIS — F331 Major depressive disorder, recurrent, moderate: Secondary | ICD-10-CM | POA: Diagnosis not present

## 2017-12-20 DIAGNOSIS — Z87891 Personal history of nicotine dependence: Secondary | ICD-10-CM | POA: Diagnosis not present

## 2017-12-20 MED ORDER — TRAZODONE HCL 100 MG PO TABS: 200.0000 mg | ORAL_TABLET | Freq: Every day | ORAL | 3 refills | Status: DC

## 2017-12-20 MED ORDER — TRAZODONE HCL 100 MG PO TABS
200.0000 mg | ORAL_TABLET | Freq: Every day | ORAL | 3 refills | Status: DC
Start: 2017-12-20 — End: 2017-12-20

## 2017-12-20 MED ORDER — ESCITALOPRAM OXALATE 10 MG PO TABS: 10.0000 mg | ORAL_TABLET | Freq: Every morning | ORAL | 3 refills | Status: DC
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
## 2018-01-07 ENCOUNTER — Encounter: Payer: Self-pay | Admitting: Thoracic Surgery (Cardiothoracic Vascular Surgery)

## 2018-02-21 ENCOUNTER — Other Ambulatory Visit: Payer: Self-pay

## 2018-02-21 ENCOUNTER — Emergency Department (HOSPITAL_COMMUNITY)
Admission: EM | Admit: 2018-02-21 | Discharge: 2018-02-21 | Disposition: A | Discharge status: Home / Self Care | Payer: 59 | Attending: Emergency Medicine | Admitting: Emergency Medicine

## 2018-02-21 ENCOUNTER — Emergency Department (HOSPITAL_COMMUNITY): Payer: 59

## 2018-02-21 ENCOUNTER — Encounter (HOSPITAL_COMMUNITY): Payer: Self-pay | Admitting: Emergency Medicine

## 2018-02-21 DIAGNOSIS — F329 Major depressive disorder, single episode, unspecified: Secondary | ICD-10-CM | POA: Diagnosis not present

## 2018-02-21 DIAGNOSIS — J40 Bronchitis, not specified as acute or chronic: Secondary | ICD-10-CM

## 2018-02-21 DIAGNOSIS — R05 Cough: Secondary | ICD-10-CM | POA: Diagnosis present

## 2018-02-21 DIAGNOSIS — I10 Essential (primary) hypertension: Secondary | ICD-10-CM | POA: Insufficient documentation

## 2018-02-21 DIAGNOSIS — Z794 Long term (current) use of insulin: Secondary | ICD-10-CM | POA: Insufficient documentation

## 2018-02-21 DIAGNOSIS — Z79899 Other long term (current) drug therapy: Secondary | ICD-10-CM | POA: Diagnosis not present

## 2018-02-21 DIAGNOSIS — E119 Type 2 diabetes mellitus without complications: Secondary | ICD-10-CM | POA: Diagnosis not present

## 2018-02-21 DIAGNOSIS — Z85118 Personal history of other malignant neoplasm of bronchus and lung: Secondary | ICD-10-CM | POA: Insufficient documentation

## 2018-02-21 DIAGNOSIS — Z87891 Personal history of nicotine dependence: Secondary | ICD-10-CM | POA: Diagnosis not present

## 2018-02-21 DIAGNOSIS — H6593 Unspecified nonsuppurative otitis media, bilateral: Secondary | ICD-10-CM | POA: Diagnosis not present

## 2018-02-21 MED ORDER — AZITHROMYCIN 250 MG PO TABS: ORAL_TABLET | ORAL | 0 refills | Status: DC

## 2018-02-21 MED ORDER — DEXAMETHASONE SODIUM PHOSPHATE 10 MG/ML IJ SOLN
10.0000 mg | Freq: Once | INTRAMUSCULAR | Status: AC
  Administered 2018-02-21: 10 mg via INTRAMUSCULAR
  Filled 2018-02-21: qty 1

## 2018-02-21 MED ORDER — AZITHROMYCIN 250 MG PO TABS
500.0000 mg | ORAL_TABLET | Freq: Once | ORAL | Status: AC
  Administered 2018-02-21: 500 mg via ORAL
  Filled 2018-02-21: qty 2

## 2018-02-21 MED ORDER — HYDROCODONE-HOMATROPINE 5-1.5 MG/5ML PO SYRP: 5.0000 mL | ORAL_SOLUTION | Freq: Four times a day (QID) | ORAL | 0 refills | Status: DC | PRN

## 2018-02-21 MED ORDER — AMOXICILLIN 250 MG PO CAPS
500.0000 mg | ORAL_CAPSULE | Freq: Once | ORAL | Status: AC
  Administered 2018-02-21: 500 mg via ORAL
  Filled 2018-02-21: qty 2

## 2018-02-21 MED ORDER — ONDANSETRON HCL 4 MG PO TABS
4.0000 mg | ORAL_TABLET | Freq: Once | ORAL | Status: AC
  Administered 2018-02-21: 4 mg via ORAL
  Filled 2018-02-21: qty 1

## 2018-02-21 MED ORDER — PSEUDOEPHEDRINE HCL 60 MG PO TABS
60.0000 mg | ORAL_TABLET | Freq: Once | ORAL | Status: AC
  Administered 2018-02-21: 60 mg via ORAL
  Filled 2018-02-21: qty 1

## 2018-02-21 MED ORDER — IBUPROFEN 400 MG PO TABS
400.0000 mg | ORAL_TABLET | Freq: Once | ORAL | Status: AC
  Administered 2018-02-21: 400 mg via ORAL
  Filled 2018-02-21: qty 1

## 2018-02-21 MED ORDER — DEXAMETHASONE 4 MG PO TABS: 4.0000 mg | ORAL_TABLET | Freq: Two times a day (BID) | ORAL | 0 refills | Status: DC

## 2018-02-21 NOTE — Discharge Instructions (Addendum)
Your oxygen level is 98% on room air.  Your temperature is within normal limits.  Your examination suggest bronchitis and bilateral ear infection.  Please use Zithromax daily with food until all taken.  Use Decadron 2 times daily with a meal.  Use Sudafed or the decongestant of your choice.  Use Hycodan every 6 hours as needed for cough. This medication may cause drowsiness. Please do not drink, drive, or participate in activity that requires concentration while taking this medication.  Please see Dr. Dorthy Cooler this week for recheck of your bronchitis and ear infections.  Please increase fluids.  Use Tylenol every 4 hours, or ibuprofen every 6 hours for fever or aching.

## 2018-02-21 NOTE — ED Notes (Signed)
Pt out of room   States she has been here for 3 hours and has yet to be seen  Apologies for wait, explained due to number of patients, it is taking longer to be evaluated

## 2018-02-21 NOTE — ED Triage Notes (Signed)
Patient complaining of cough and generalized body aches x 5 days.

## 2018-02-21 NOTE — ED Provider Notes (Signed)
[facility] EMERGENCY DEPARTMENT Provider Note   CSN: [id number] Arrival date & time: 02/21/18  1650     History   Chief Complaint Chief Complaint  Patient presents with  . Cough    HPI [name] is a 62 y.o. female.  Patient is a 62 year old female who presents to the emergency department with a complaint of cough and congestion.  The patient states that this is been going on for a week.  She has been trying numerous over-the-counter medications.  3 days ago she thought she was getting better, but today at work she was worse.  The patient states that she has a lot of mucus in her head, she has pain going from the right ear down toward her throat.  She has pain in both ears.  She denies hemoptysis.  She has had some chills, but is not sure about fever.  She presents now because she says she is taking everything she knows to take and still sick.  The history is provided by the patient.  Cough  Associated symptoms include chills, ear pain, myalgias and shortness of breath. Pertinent negatives include no chest pain and no wheezing.    Past Medical History:  Diagnosis Date  . Adenocarcinoma of right lung, stage 1 ([place]) 11/29/2016  . Cancer ([place])   . Chronic pain   . Depression    takes Lexapro daily  . Diabetes mellitus    takes Metformin daily and has an insulin pump.Fasting blood sugar runs250  . GERD (gastroesophageal reflux disease)    takes Pantoprazole daily  . History of colon polyps    benign  . Hyperlipidemia    takes Atorvastatin daily  . Hypertension    takes Amlodipine and Lisinopril daily  . Insomnia    takes Trazodone nightly  . Nocturia   . Thyroid disease   . Urinary frequency   . Urinary urgency     Patient Active Problem List   Diagnosis Date Noted  . Adenocarcinoma of right lung, stage 1 ([place]) 11/29/2016  . S/P lobectomy of lung 10/15/2016  . Hyperlipidemia 10/02/2016  . Lung nodule 10/02/2016  . Adrenal adenoma, right 10/02/2016  .  Diabetes ([place]) 06/16/2016  . HTN (hypertension) 05/31/2016  . Change in bowel habits 03/03/2014  . Depression 10/13/2013  . Fractured lateral malleolus 08/12/2012  . Left knee sprain 08/12/2012    Past Surgical History:  Procedure Laterality Date  . ABDOMINAL HYSTERECTOMY    . ABDOMINAL SURGERY    . COLONOSCOPY  2005 MAC   TICs, IH  . COLONOSCOPY N/A 03/08/2014   Procedure: COLONOSCOPY;  Surgeon: Danie Binder, MD;  [facility];  Service: Endoscopy;  Laterality: N/A;  1015  . CYSTOSTOMY  11/22/2011   Procedure: CYSTOSTOMY SUPRAPUBIC;  Surgeon: Marissa Nestle, MD;  Location[facility];  Service: Urology;  Laterality: N/A;  . ESOPHAGOGASTRODUODENOSCOPY N/A 03/08/2014   Procedure: ESOPHAGOGASTRODUODENOSCOPY (EGD);  Surgeon: Danie Binder, MD;  [facility];  Service: Endoscopy;  Laterality: N/A;  . ESOPHAGOGASTRODUODENOSCOPY    . HALLUX VALGUS CORRECTION     bilateral foot surgery for bone repairs-multiple  . LOBECTOMY Right 10/15/2016   Procedure: RIGHT UPPER LOBECTOMY;  Surgeon: [name], MD;  Location: [facility];  Service: Thoracic;  Laterality: Right;  . LYMPH NODE DISSECTION Right 10/15/2016   Procedure: LYMPH NODE DISSECTION;  Surgeon: [name], MD;  Location: [facility];  Service: Thoracic;  Laterality: Right;  Marland Kitchen VAGINA RECONSTRUCTION SURGERY  [facility]  . VIDEO ASSISTED THORACOSCOPY (VATS)/WEDGE RESECTION Right 10/15/2016   Procedure: RIGHT VIDEO ASSISTED THORACOSCOPY (VATS)/WEDGE RESECTION;  Surgeon: [name], MD;  [facility];  Service: Thoracic;  Laterality: Right;     OB History    Gravida      Para      Term      Preterm      AB      Living  0     SAB      TAB      Ectopic      Multiple      Live Births               Home Medications    Prior to Admission medications   Medication Sig Start Date End Date Taking? Authorizing Provider  acetaminophen (TYLENOL) 500 MG tablet  Take 1 tablet (500 mg total) by mouth every 6 (six) hours as needed. 08/07/17   Horton, Barbette Hair, MD  amLODipine (NORVASC) 5 MG tablet Take 5 mg by mouth daily at 6 PM.     [provider]  atorvastatin (LIPITOR) 20 MG tablet Take 20 mg by mouth daily. 04/27/17   [provider]  bromocriptine (PARLODEL) 2.5 MG tablet 1/4 tab daily 10/14/17   Renato Shin, MD  Calcium Carb-Cholecalciferol (CALCIUM 600+D3 PO) Take 1 tablet by mouth 2 (two) times a week.    [provider]  chlorthalidone (HYGROTON) 25 MG tablet Take 25 mg by mouth as directed. Takes 1/2 tablet daily 11/26/16   [provider]  dapagliflozin propanediol (FARXIGA) 10 MG TABS tablet Take 10 mg by mouth daily. 12/13/17   Renato Shin, MD  Dulaglutide (TRULICITY) 1.5 WU/1.3KG SOPN Inject 1.5 mg into the skin once a week. 08/13/17   Renato Shin, MD  escitalopram (LEXAPRO) 10 MG tablet Take 1 tablet (10 mg total) by mouth every morning. 12/20/17   Cloria Spring, MD  Insulin Disposable Pump (V-GO 40) KIT 1 Device by Does not apply route daily. 10/01/17   Renato Shin, MD  insulin lispro (HUMALOG) 100 UNIT/ML injection For use in pump, total of 70 units per day 12/04/16   Renato Shin, MD  lisinopril (PRINIVIL,ZESTRIL) 40 MG tablet Take 40 mg by mouth daily.    [provider]  metFORMIN (GLUCOPHAGE-XR) 500 MG 24 hr tablet TAKE 1 TABLET (500 MG TOTAL) BY MOUTH 2 (TWO) TIMES DAILY. 08/27/17   Renato Shin, MD  Omega-3 Fatty Acids (FISH OIL) 600 MG CAPS Take 1,200 mg by mouth at bedtime.    [provider]  [facility] VERIO test strip USE TO TEST BLOOD SUGAR TWICE A DAY 08/21/17   Renato Shin, MD  pantoprazole (PROTONIX) 40 MG tablet Take 40 mg by mouth daily.    [provider]  traZODone (DESYREL) 100 MG tablet Take 2 tablets (200 mg total) by mouth at bedtime. 12/20/17   Cloria Spring, MD  YUVAFEM 10 MCG TABS vaginal tablet Place 10 mcg vaginally See admin instructions. Twice a  week as needed for vaginal dryness 09/29/16   [provider]    Family History Family History  Problem Relation Age of Onset  . Asthma Unknown   . Diabetes Unknown   . Arthritis Unknown   . Cancer Mother   . Cancer Brother   . Anesthesia problems Neg Hx   . Hypotension Neg Hx   . Malignant hyperthermia Neg Hx   . Pseudochol deficiency Neg Hx   .  Colon polyps Neg Hx   . Colon cancer Neg Hx   . Celiac disease Neg Hx   . Pancreatic cancer Neg Hx   . Stomach cancer Neg Hx   . Ulcerative colitis Neg Hx   . Crohn's disease Neg Hx     Social History Social History   Tobacco Use  . Smoking status: Former Smoker    Packs/day: 1.00    Years: 32.00    Pack years: 32.00    Types: Cigarettes  . Smokeless tobacco: Never Used  . Tobacco comment: quit smoking 15 yrs ago  Substance Use Topics  . Alcohol use: No    Comment: rarely  . Drug use: No     Allergies   Synthroid [levothyroxine sodium]; Iodine; and Povidone-iodine   Review of Systems Review of Systems  Constitutional: Positive for chills and fatigue. Negative for activity change.       All ROS Neg except as noted in HPI  HENT: Positive for congestion, ear pain, postnasal drip and sinus pressure. Negative for nosebleeds and voice change.   Eyes: Negative for photophobia and discharge.  Respiratory: Positive for cough and shortness of breath. Negative for wheezing.   Cardiovascular: Negative for chest pain and palpitations.  Gastrointestinal: Negative for abdominal pain and blood in stool.  Genitourinary: Negative for dysuria, frequency and hematuria.  Musculoskeletal: Positive for myalgias. Negative for arthralgias, back pain and neck pain.  Skin: Negative.   Neurological: Negative for dizziness, seizures and speech difficulty.  Psychiatric/Behavioral: Negative for confusion and hallucinations.     Physical Exam Updated Vital Signs BP (!) 156/86 (BP Location: Right Arm)   Pulse 100   Temp 98.8 F (37.1  C) (Temporal)   Resp 20   SpO2 98%   Physical Exam  Constitutional: She is oriented to person, place, and time. She appears well-developed and well-nourished.  Non-toxic appearance.  HENT:  Head: Normocephalic.  Right Ear: External ear and ear canal normal. Tympanic membrane is erythematous. Tympanic membrane is not bulging.  Left Ear: External ear and ear canal normal. Tympanic membrane is erythematous and bulging.  Nasal congestion present.  Airway is patent.  Uvula is enlarged.  There is no mastoid involvement.  Eyes: Pupils are equal, round, and reactive to light. EOM and lids are normal.  Neck: Normal range of motion. Neck supple. Carotid bruit is not present.  Cardiovascular: Normal rate, regular rhythm, normal heart sounds, intact distal pulses and normal pulses.  Pulmonary/Chest: Breath sounds normal. No respiratory distress.  Coarse breath sounds with few scattered rhonchi.  There is symmetrical rise and fall of the chest.  The patient speaks in complete sentences without problem.  Abdominal: Soft. Bowel sounds are normal. There is no tenderness. There is no guarding.  Musculoskeletal: Normal range of motion.  Lymphadenopathy:       Head (right side): No submandibular adenopathy present.       Head (left side): No submandibular adenopathy present.    She has no cervical adenopathy.  Neurological: She is alert and oriented to person, place, and time. She has normal strength. No cranial nerve deficit or sensory deficit.  Skin: Skin is warm and dry.  Psychiatric: She has a normal mood and affect. Her speech is normal.  Nursing note and vitals reviewed.    ED Treatments / Results  Labs (all labs ordered are listed, but only abnormal results are displayed) Labs Reviewed - No data to display  EKG None  Radiology Dg Chest 2 View  Result  Date: 02/21/2018 CLINICAL DATA:  Mildly productive cough EXAM: CHEST - 2 VIEW COMPARISON:  11/05/2017 FINDINGS: Chronic linear scarring  in the postoperative right lung. Normal heart size and mediastinal contours. There is no edema, consolidation, effusion, or pneumothorax. IMPRESSION: 1. No acute finding. 2. Right-sided lobectomy. Electronically Signed   By: Monte Fantasia M.D.   On: 02/21/2018 17:30    Procedures Procedures (including critical care time)  Medications Ordered in ED Medications - No data to display   Initial Impression / Assessment and Plan / ED Course  I have reviewed the triage vital signs and the nursing notes.  Pertinent labs & imaging results that were available during my care of the patient were reviewed by me and considered in my medical decision making (see chart for details).       Final Clinical Impressions(s) / ED Diagnoses MDM  Vital signs reviewed.  On examination patient has congestion in the nasal passages and soreness over the sinuses.  There is evidence of bilateral ear infection.  There is coarse breath sounds with bilateral rhonchi present.  The chest x-ray is negative for pneumonia, pneumothorax, or other emergent changes.  The patient will be treated with Zithromax, Decadron, and Hycodan.  I have asked the patient to use the decongesting medication of her choice.  I have asked her to increase fluids, and to see Dr.Koirala in the office next week for follow-up and recheck.   Final diagnoses:  Bilateral non-suppurative otitis media  Bronchitis    ED Discharge Orders        Ordered    azithromycin (ZITHROMAX) 250 MG tablet     02/21/18 2039    HYDROcodone-homatropine (HYCODAN) 5-1.5 MG/5ML syrup  Every 6 hours PRN     02/21/18 2039    dexamethasone (DECADRON) 4 MG tablet  2 times daily with meals     02/21/18 2039       Lily Kocher, PA-C 02/21/18 2054    Mesner, Corene Cornea, MD 02/22/18 [id number]

## 2018-02-21 NOTE — ED Notes (Signed)
Cough since last Sunday  Felt better WEds, went back to work Worse yesterday   Has taken musinex without relief and other OTC meds

## 2018-03-17 ENCOUNTER — Ambulatory Visit: Payer: 59 | Admitting: Endocrinology

## 2018-03-17 ENCOUNTER — Encounter: Payer: Self-pay | Admitting: Endocrinology

## 2018-03-17 VITALS — BP 134/90 | HR 85 | Temp 97.8°F | Ht 66.0 in | Wt 259.2 lb

## 2018-03-17 DIAGNOSIS — E119 Type 2 diabetes mellitus without complications: Secondary | ICD-10-CM | POA: Diagnosis not present

## 2018-03-17 DIAGNOSIS — Z794 Long term (current) use of insulin: Secondary | ICD-10-CM

## 2018-03-17 LAB — POCT GLYCOSYLATED HEMOGLOBIN (HGB A1C): Hemoglobin A1C: 8.1 % — AB (ref 4.0–5.6)

## 2018-03-17 MED ORDER — INSULIN LISPRO 100 UNIT/ML ~~LOC~~ SOLN: SUBCUTANEOUS | 3 refills | Status: DC

## 2018-03-17 NOTE — Progress Notes (Signed)
Subjective:    Patient ID: [name], female    DOB: 1956/03/13, 62 y.o.   MRN: [id number]  HPI Pt returns for f/u of diabetes mellitus: DM type: Insulin-requiring type 2.   Dx'ed: 4650 Complications: none Therapy: insulin since soon after dx, metformin, farxiga, bromocriptine, and trulicity.     GDM: never (G0) DKA: never Severe hypoglycemia: never.  Pancreatitis: never.  Other: she stopped victoza, due to lack of effect; she uses V-GO-40; metformin dosage has been limited by nausea.  Interval history: She took steroids a few weeks ago, for AB.  She takes  A total of 18 clicks per day, via th V-GO-40no cbg record, but states cbg's were well-controlled, except for the time she was on steroids Past Medical History:  Diagnosis Date  . Adenocarcinoma of right lung, stage 1 ([place]) 11/29/2016  . Cancer ([place])   . Chronic pain   . Depression    takes Lexapro daily  . Diabetes mellitus    takes Metformin daily and has an insulin pump.Fasting blood sugar runs250  . GERD (gastroesophageal reflux disease)    takes Pantoprazole daily  . History of colon polyps    benign  . Hyperlipidemia    takes Atorvastatin daily  . Hypertension    takes Amlodipine and Lisinopril daily  . Insomnia    takes Trazodone nightly  . Nocturia   . Thyroid disease   . Urinary frequency   . Urinary urgency     Past Surgical History:  Procedure Laterality Date  . ABDOMINAL HYSTERECTOMY    . ABDOMINAL SURGERY    . COLONOSCOPY  2005 MAC   TICs, IH  . COLONOSCOPY N/A 03/08/2014   Procedure: COLONOSCOPY;  Surgeon: Danie Binder, MD;  [facility];  Service: Endoscopy;  Laterality: N/A;  1015  . CYSTOSTOMY  11/22/2011   Procedure: CYSTOSTOMY SUPRAPUBIC;  Surgeon: Marissa Nestle, MD;  Location: [facility];  Service: Urology;  Laterality: N/A;  . ESOPHAGOGASTRODUODENOSCOPY N/A 03/08/2014   Procedure: ESOPHAGOGASTRODUODENOSCOPY (EGD);  Surgeon: Danie Binder, MD;  [facility];   Service: Endoscopy;  Laterality: N/A;  . ESOPHAGOGASTRODUODENOSCOPY    . HALLUX VALGUS CORRECTION     bilateral foot surgery for bone repairs-multiple  . LOBECTOMY Right 10/15/2016   Procedure: RIGHT UPPER LOBECTOMY;  Surgeon: [name], MD;  Location: [facility];  Service: Thoracic;  Laterality: Right;  . LYMPH NODE DISSECTION Right 10/15/2016   Procedure: LYMPH NODE DISSECTION;  Surgeon: [name], MD;  Location: [facility];  Service: Thoracic;  Laterality: Right;  Marland Kitchen VAGINA RECONSTRUCTION SURGERY     tvt 2006- [place]  . VIDEO ASSISTED THORACOSCOPY (VATS)/WEDGE RESECTION Right 10/15/2016   Procedure: RIGHT VIDEO ASSISTED THORACOSCOPY (VATS)/WEDGE RESECTION;  Surgeon: [name], MD;  Location: [facility];  Service: Thoracic;  Laterality: Right;    Social History   Socioeconomic History  . Marital status: Single    Spouse name: Not on file  . Number of children: Not on file  . Years of education: Not on file  . Highest education level: Not on file  Occupational History  . Not on file  Social Needs  . Financial resource strain: Not on file  . Food insecurity:    Worry: Not on file    Inability: Not on file  . Transportation needs:    Medical: Not on file    Non-medical: Not on file  Tobacco Use  . Smoking status: Former Smoker  Packs/day: 1.00    Years: 32.00    Pack years: 32.00    Types: Cigarettes  . Smokeless tobacco: Never Used  . Tobacco comment: quit smoking 15 yrs ago  Substance and Sexual Activity  . Alcohol use: No    Comment: rarely  . Drug use: No  . Sexual activity: Not Currently    Birth control/protection: Surgical  Lifestyle  . Physical activity:    Days per week: Not on file    Minutes per session: Not on file  . Stress: Not on file  Relationships  . Social connections:    Talks on phone: Not on file    Gets together: Not on file    Attends religious service: Not on file    Active member of club or organization: Not on  file    Attends meetings of clubs or organizations: Not on file    Relationship status: Not on file  . Intimate partner violence:    Fear of current or ex partner: Not on file    Emotionally abused: Not on file    Physically abused: Not on file    Forced sexual activity: Not on file  Other Topics Concern  . Not on file  Social History Narrative  . Not on file    Current Outpatient Medications on File Prior to Visit  Medication Sig Dispense Refill  . acetaminophen (TYLENOL) 500 MG tablet Take 1 tablet (500 mg total) by mouth every 6 (six) hours as needed. 30 tablet 0  . amLODipine (NORVASC) 5 MG tablet Take 5 mg by mouth daily at 6 PM.     . atorvastatin (LIPITOR) 20 MG tablet Take 20 mg by mouth daily.  11  . bromocriptine (PARLODEL) 2.5 MG tablet 1/4 tab daily 25 tablet 3  . Calcium Carb-Cholecalciferol (CALCIUM 600+D3 PO) Take 1 tablet by mouth 2 (two) times a week.    . chlorthalidone (HYGROTON) 25 MG tablet Take 25 mg by mouth as directed. Takes 1/2 tablet daily    . dapagliflozin propanediol (FARXIGA) 10 MG TABS tablet Take 10 mg by mouth daily. 30 tablet 11  . Dulaglutide (TRULICITY) 1.5 LS/9.3TD SOPN Inject 1.5 mg into the skin once a week. 4 pen 11  . escitalopram (LEXAPRO) 10 MG tablet Take 1 tablet (10 mg total) by mouth every morning. 90 tablet 3  . Insulin Disposable Pump (V-GO 40) KIT 1 Device by Does not apply route daily. 90 kit 3  . liraglutide (VICTOZA) 18 MG/3ML SOPN INJECT 1.2 MG SUBCUTANEOUSLY DAILY    . lisinopril (PRINIVIL,ZESTRIL) 40 MG tablet Take 40 mg by mouth daily.    . metFORMIN (GLUCOPHAGE-XR) 500 MG 24 hr tablet TAKE 1 TABLET (500 MG TOTAL) BY MOUTH 2 (TWO) TIMES DAILY. 180 tablet 3  . Omega-3 Fatty Acids (FISH OIL) 600 MG CAPS Take 1,200 mg by mouth at bedtime.    Glory Rosebush VERIO test strip USE TO TEST BLOOD SUGAR TWICE A DAY 100 each 5  . pantoprazole (PROTONIX) 40 MG tablet Take 40 mg by mouth daily.    . traZODone (DESYREL) 100 MG tablet Take 2  tablets (200 mg total) by mouth at bedtime. 180 tablet 3  . YUVAFEM 10 MCG TABS vaginal tablet Place 10 mcg vaginally See admin instructions. Twice a week as needed for vaginal dryness  11   No current facility-administered medications on file prior to visit.     Allergies  Allergen Reactions  . Synthroid [Levothyroxine Sodium] Anaphylaxis  . Iodine  Hives  . Povidone-Iodine Itching and Rash    Family History  Problem Relation Age of Onset  . Asthma Unknown   . Diabetes Unknown   . Arthritis Unknown   . Cancer Mother   . Cancer Brother   . Anesthesia problems Neg Hx   . Hypotension Neg Hx   . Malignant hyperthermia Neg Hx   . Pseudochol deficiency Neg Hx   . Colon polyps Neg Hx   . Colon cancer Neg Hx   . Celiac disease Neg Hx   . Pancreatic cancer Neg Hx   . Stomach cancer Neg Hx   . Ulcerative colitis Neg Hx   . Crohn's disease Neg Hx     BP 134/90 (BP Location: Left Arm, Patient Position: Sitting, Cuff Size: Normal)   Pulse 85   Temp 97.8 F (36.6 C) (Oral)   Ht _0  (1.676 m)   Wt 259 lb 3.2 oz (117.6 kg)   SpO2 96%   BMI 41.84 kg/m   Review of Systems She denies hypoglycemia    Objective:   Physical Exam VITAL SIGNS:  See vs page GENERAL: no distress Pulses: dorsalis pedis intact bilat.   MSK: no deformity of the feet CV: no leg edema Skin:  no ulcer on the feet.  normal color and temp on the feet. Neuro: sensation is intact to touch on the feet.   Ext: There is bilateral onychomycosis of the toenails.   Lab Results  Component Value Date   HGBA1C 8.1 (A) 03/17/2018      Assessment & Plan:  Insulin-requiring type 2 DM: worse. AB: steroid rx is affecting a1c, so we'll continue the same V-GO-40   Patient Instructions  please continue the same diabetes medications.  check your blood sugar twice a day.  vary the time of day when you check, between before the 3 meals, and at bedtime.  also check if you have symptoms of your blood sugar being too  high or too low.  please keep a record of the readings and bring it to your next appointment here (or you can bring the meter itself).  You can write it on any piece of paper.  please call us sooner if your blood sugar goes below 70, or if you have a lot of readings over 200.   Please come back for a follow-up appointment in 3 months.

## 2018-03-17 NOTE — Patient Instructions (Addendum)
please continue the same diabetes medications.  check your blood sugar twice a day.  vary the time of day when you check, between before the 3 meals, and at bedtime.  also check if you have symptoms of your blood sugar being too high or too low.  please keep a record of the readings and bring it to your next appointment here (or you can bring the meter itself).  You can write it on any piece of paper.  please call us sooner if your blood sugar goes below 70, or if you have a lot of readings over 200.   Please come back for a follow-up appointment in 3 months.

## 2018-03-26 ENCOUNTER — Other Ambulatory Visit: Payer: Self-pay | Admitting: Emergency Medicine

## 2018-03-26 MED ORDER — INSULIN LISPRO 100 UNIT/ML ~~LOC~~ SOLN: SUBCUTANEOUS | 3 refills | Status: DC

## 2018-04-14 ENCOUNTER — Other Ambulatory Visit: Payer: Self-pay | Admitting: Family Medicine

## 2018-04-14 DIAGNOSIS — Z1231 Encounter for screening mammogram for malignant neoplasm of breast: Secondary | ICD-10-CM

## 2018-04-23 ENCOUNTER — Telehealth: Payer: Self-pay | Admitting: Endocrinology

## 2018-04-23 ENCOUNTER — Other Ambulatory Visit: Payer: Self-pay

## 2018-04-23 MED ORDER — INSULIN LISPRO 100 UNIT/ML ~~LOC~~ SOLN: SUBCUTANEOUS | 3 refills | Status: DC

## 2018-04-23 NOTE — Telephone Encounter (Signed)
I did send for 3 vials at a time, but it sounds like pharmacy filled for just 2.  I have resent.

## 2018-04-23 NOTE — Telephone Encounter (Signed)
[facility] customer care is calling about patients prescription . She is running out of insulin for her Vgo4 0 She needs 3 vials of insulin and Dr is only prescribing 2 vials    They would like to know if this could be corrected and sent into the pharmacy Once this is sent in please contact patient to let her know    Customer care did not give which pharmacy the patient is wanting to use

## 2018-04-23 NOTE — Telephone Encounter (Signed)
Please advise 

## 2018-04-24 ENCOUNTER — Other Ambulatory Visit: Payer: Self-pay

## 2018-04-24 MED ORDER — INSULIN LISPRO 100 UNIT/ML ~~LOC~~ SOLN: SUBCUTANEOUS | 3 refills | Status: DC

## 2018-04-24 NOTE — Telephone Encounter (Signed)
Called her mail order pharmacy and had the issue cleared up however they stated that a new shipment would not be sent until Nov so pt stated that she would check with her local pharmacy to see if she could get vials to cover her until then and I informed pt that if she could not  to call back and we will get her samples

## 2018-05-12 ENCOUNTER — Ambulatory Visit
Admission: RE | Admit: 2018-05-12 | Discharge: 2018-05-12 | Disposition: A | Discharge status: Home / Self Care | Payer: 59 | Source: Ambulatory Visit | Attending: Family Medicine | Admitting: Family Medicine

## 2018-05-12 DIAGNOSIS — Z1231 Encounter for screening mammogram for malignant neoplasm of breast: Secondary | ICD-10-CM

## 2018-05-16 ENCOUNTER — Telehealth: Payer: Self-pay | Admitting: Internal Medicine

## 2018-05-16 NOTE — Telephone Encounter (Signed)
MM PAL 11/14 - moved f/u to 11/12. Other appointments remain the same. Left message. Schedule mailed.

## 2018-06-06 ENCOUNTER — Inpatient Hospital Stay: Payer: 59 | Attending: Internal Medicine

## 2018-06-06 ENCOUNTER — Ambulatory Visit (HOSPITAL_COMMUNITY)
Admission: RE | Admit: 2018-06-06 | Discharge: 2018-06-06 | Disposition: A | Discharge status: Home / Self Care | Payer: 59 | Source: Ambulatory Visit | Attending: Internal Medicine | Admitting: Internal Medicine

## 2018-06-06 ENCOUNTER — Encounter (HOSPITAL_COMMUNITY): Payer: Self-pay

## 2018-06-06 DIAGNOSIS — Z902 Acquired absence of lung [part of]: Secondary | ICD-10-CM | POA: Insufficient documentation

## 2018-06-06 DIAGNOSIS — R599 Enlarged lymph nodes, unspecified: Secondary | ICD-10-CM | POA: Insufficient documentation

## 2018-06-06 DIAGNOSIS — C3411 Malignant neoplasm of upper lobe, right bronchus or lung: Secondary | ICD-10-CM | POA: Diagnosis not present

## 2018-06-06 DIAGNOSIS — I10 Essential (primary) hypertension: Secondary | ICD-10-CM | POA: Insufficient documentation

## 2018-06-06 DIAGNOSIS — E119 Type 2 diabetes mellitus without complications: Secondary | ICD-10-CM | POA: Insufficient documentation

## 2018-06-06 DIAGNOSIS — Z79899 Other long term (current) drug therapy: Secondary | ICD-10-CM | POA: Diagnosis not present

## 2018-06-06 DIAGNOSIS — C3490 Malignant neoplasm of unspecified part of unspecified bronchus or lung: Secondary | ICD-10-CM

## 2018-06-06 DIAGNOSIS — I700 Atherosclerosis of aorta: Secondary | ICD-10-CM | POA: Diagnosis not present

## 2018-06-06 DIAGNOSIS — Z794 Long term (current) use of insulin: Secondary | ICD-10-CM | POA: Insufficient documentation

## 2018-06-06 LAB — CMP (CANCER CENTER ONLY)
ALT: 20 U/L (ref 0–44)
AST: 17 U/L (ref 15–41)
Albumin: 3.5 g/dL (ref 3.5–5.0)
Alkaline Phosphatase: 103 U/L (ref 38–126)
Anion gap: 10 (ref 5–15)
BUN: 15 mg/dL (ref 8–23)
CO2: 26 mmol/L (ref 22–32)
Calcium: 9.4 mg/dL (ref 8.9–10.3)
Chloride: 104 mmol/L (ref 98–111)
Creatinine: 0.84 mg/dL (ref 0.44–1.00)
GFR, Est AFR Am: >60 mL/min (ref >60)
GFR, Estimated: >60 mL/min (ref >60)
Glucose, Bld: 141 mg/dL — ABNORMAL HIGH (ref 70–99)
Potassium: 4.3 mmol/L (ref 3.5–5.1)
Sodium: 140 mmol/L (ref 135–145)
Total Bilirubin: 0.4 mg/dL (ref 0.3–1.2)
Total Protein: 6.6 g/dL (ref 6.5–8.1)

## 2018-06-06 LAB — CBC WITH DIFFERENTIAL (CANCER CENTER ONLY)
Abs Immature Granulocytes: 0.05 10*3/uL (ref 0.00–0.07)
Basophils Absolute: 0.1 10*3/uL (ref 0.0–0.1)
Basophils Relative: 1 %
Eosinophils Absolute: 0.5 10*3/uL (ref 0.0–0.5)
Eosinophils Relative: 6 %
HCT: 42 % (ref 36.0–46.0)
Hemoglobin: 13.3 g/dL (ref 12.0–15.0)
Immature Granulocytes: 1 %
Lymphocytes Relative: 21 %
Lymphs Abs: 1.8 10*3/uL (ref 0.7–4.0)
MCH: 25.1 pg — ABNORMAL LOW (ref 26.0–34.0)
MCHC: 31.7 g/dL (ref 30.0–36.0)
MCV: 79.4 fL — ABNORMAL LOW (ref 80.0–100.0)
Monocytes Absolute: 0.5 10*3/uL (ref 0.1–1.0)
Monocytes Relative: 6 %
Neutro Abs: 5.5 10*3/uL (ref 1.7–7.7)
Neutrophils Relative %: 65 %
Platelet Count: 259 10*3/uL (ref 150–400)
RBC: 5.29 MIL/uL — ABNORMAL HIGH (ref 3.87–5.11)
RDW: 14 % (ref 11.5–15.5)
WBC Count: 8.4 10*3/uL (ref 4.0–10.5)
nRBC: 0 % (ref 0.0–0.2)

## 2018-06-06 MED ORDER — IOHEXOL 300 MG/ML  SOLN
75.0000 mL | Freq: Once | INTRAMUSCULAR | Status: AC | PRN
  Administered 2018-06-06: 75 mL via INTRAVENOUS

## 2018-06-06 MED ORDER — SODIUM CHLORIDE (PF) 0.9 % IJ SOLN
INTRAMUSCULAR | Status: AC
  Filled 2018-06-06: qty 50

## 2018-06-10 ENCOUNTER — Telehealth: Payer: Self-pay

## 2018-06-10 ENCOUNTER — Encounter: Payer: Self-pay | Admitting: Internal Medicine

## 2018-06-10 ENCOUNTER — Inpatient Hospital Stay: Payer: 59 | Admitting: Internal Medicine

## 2018-06-10 VITALS — Ht 66.0 in | Wt 262.2 lb

## 2018-06-10 DIAGNOSIS — Z79899 Other long term (current) drug therapy: Secondary | ICD-10-CM

## 2018-06-10 DIAGNOSIS — C3411 Malignant neoplasm of upper lobe, right bronchus or lung: Secondary | ICD-10-CM

## 2018-06-10 DIAGNOSIS — Z902 Acquired absence of lung [part of]: Secondary | ICD-10-CM | POA: Diagnosis not present

## 2018-06-10 DIAGNOSIS — E119 Type 2 diabetes mellitus without complications: Secondary | ICD-10-CM

## 2018-06-10 DIAGNOSIS — Z794 Long term (current) use of insulin: Secondary | ICD-10-CM

## 2018-06-10 DIAGNOSIS — I10 Essential (primary) hypertension: Secondary | ICD-10-CM

## 2018-06-10 DIAGNOSIS — C3490 Malignant neoplasm of unspecified part of unspecified bronchus or lung: Secondary | ICD-10-CM

## 2018-06-10 DIAGNOSIS — C3491 Malignant neoplasm of unspecified part of right bronchus or lung: Secondary | ICD-10-CM

## 2018-06-10 NOTE — Progress Notes (Signed)
[facility] [phone]   [phone]  OFFICE PROGRESS NOTE  Koir[place], [place], MD [address] [place] [address]  DIAGNOSIS: Stage IA (T1a, N0, M0) non-small cell lung cancer, adenocarcinoma presented with right upper lobe pulmonary nodule   PRIOR THERAPY: S/P right VATS with right upper lobectomy and mediastinal lymph node dissection under the care of Dr. Roxan Hockey on October 15, 2016.  CURRENT THERAPY: Observation.  INTERVAL HISTORY: [name] 62 y.o. female returns to the clinic today for six-month follow-up visit.  The patient is feeling fine today with no specific complaints except for intermittent central chest pain with no significant shortness of breath, cough, hemoptysis or diaphoresis.  She denied having any nausea, vomiting, diarrhea or constipation.  She denied having any recent weight loss or night sweats.  She had repeat CT scan of the chest performed recently and she is here for evaluation and discussion of her risk her results.  MEDICAL HISTORY: Past Medical History:  Diagnosis Date  . Adenocarcinoma of right lung, stage 1 ([place]) 11/29/2016  . Cancer ([place])   . Chronic pain   . Depression    takes Lexapro daily  . Diabetes mellitus    takes Metformin daily and has an insulin pump.Fasting blood sugar runs250  . GERD (gastroesophageal reflux disease)    takes Pantoprazole daily  . History of colon polyps    benign  . Hyperlipidemia    takes Atorvastatin daily  . Hypertension    takes Amlodipine and Lisinopril daily  . Insomnia    takes Trazodone nightly  . Nocturia   . Thyroid disease   . Urinary frequency   . Urinary urgency     ALLERGIES:  is allergic to synthroid [levothyroxine sodium]; iodine; and povidone-iodine.  MEDICATIONS:  Current Outpatient Medications  Medication Sig Dispense Refill  . acetaminophen (TYLENOL) 500 MG tablet Take 1 tablet (500 mg total) by mouth every 6 (six) hours  as needed. 30 tablet 0  . amLODipine (NORVASC) 5 MG tablet Take 5 mg by mouth daily at 6 PM.     . atorvastatin (LIPITOR) 20 MG tablet Take 20 mg by mouth daily.  11  . bromocriptine (PARLODEL) 2.5 MG tablet 1/4 tab daily 25 tablet 3  . Calcium Carb-Cholecalciferol (CALCIUM 600+D3 PO) Take 1 tablet by mouth 2 (two) times a week.    . chlorthalidone (HYGROTON) 25 MG tablet Take 25 mg by mouth as directed. Takes 1/2 tablet daily    . dapagliflozin propanediol (FARXIGA) 10 MG TABS tablet Take 10 mg by mouth daily. 30 tablet 11  . Dulaglutide (TRULICITY) 1.5 VH/8.4ON SOPN Inject 1.5 mg into the skin once a week. 4 pen 11  . escitalopram (LEXAPRO) 10 MG tablet Take 1 tablet (10 mg total) by mouth every morning. 90 tablet 3  . Insulin Disposable Pump (V-GO 40) KIT 1 Device by Does not apply route daily. 90 kit 3  . insulin lispro (HUMALOG) 100 UNIT/ML injection For use in pump, total of 70 units per day 90 mL 3  . liraglutide (VICTOZA) 18 MG/3ML SOPN INJECT 1.2 MG SUBCUTANEOUSLY DAILY    . lisinopril (PRINIVIL,ZESTRIL) 40 MG tablet Take 40 mg by mouth daily.    . metFORMIN (GLUCOPHAGE-XR) 500 MG 24 hr tablet TAKE 1 TABLET (500 MG TOTAL) BY MOUTH 2 (TWO) TIMES DAILY. 180 tablet 3  . Omega-3 Fatty Acids (FISH OIL) 600 MG CAPS Take 1,200 mg by mouth at bedtime.    Marland Kitchen  ONETOUCH VERIO test strip USE TO TEST BLOOD SUGAR TWICE A DAY 100 each 5  . pantoprazole (PROTONIX) 40 MG tablet Take 40 mg by mouth daily.    . traZODone (DESYREL) 100 MG tablet Take 2 tablets (200 mg total) by mouth at bedtime. 180 tablet 3  . YUVAFEM 10 MCG TABS vaginal tablet Place 10 mcg vaginally See admin instructions. Twice a week as needed for vaginal dryness  11   No current facility-administered medications for this visit.     SURGICAL HISTORY:  Past Surgical History:  Procedure Laterality Date  . ABDOMINAL HYSTERECTOMY    . ABDOMINAL SURGERY    . COLONOSCOPY  2005 MAC   TICs, IH  . COLONOSCOPY N/A 03/08/2014   Procedure:  COLONOSCOPY;  Surgeon: Danie Binder, MD;  [facility];  Service: Endoscopy;  Laterality: N/A;  1015  . CYSTOSTOMY  11/22/2011   Procedure: CYSTOSTOMY SUPRAPUBIC;  Surgeon: Marissa Nestle, MD;  [facility];  Service: Urology;  Laterality: N/A;  . ESOPHAGOGASTRODUODENOSCOPY N/A 03/08/2014   Procedure: ESOPHAGOGASTRODUODENOSCOPY (EGD);  Surgeon: Danie Binder, MD;  [facility];  Service: Endoscopy;  Laterality: N/A;  . ESOPHAGOGASTRODUODENOSCOPY    . HALLUX VALGUS CORRECTION     bilateral foot surgery for bone repairs-multiple  . LOBECTOMY Right 10/15/2016   Procedure: RIGHT UPPER LOBECTOMY;  Surgeon: [name], MD;  Location: [facility];  Service: Thoracic;  Laterality: Right;  . LYMPH NODE DISSECTION Right 10/15/2016   Procedure: LYMPH NODE DISSECTION;  Surgeon: [name], MD;  Location: [facility];  Service: Thoracic;  Laterality: Right;  Marland Kitchen VAGINA RECONSTRUCTION SURGERY     tvt 2006- [place]  . VIDEO ASSISTED THORACOSCOPY (VATS)/WEDGE RESECTION Right 10/15/2016   Procedure: RIGHT VIDEO ASSISTED THORACOSCOPY (VATS)/WEDGE RESECTION;  Surgeon: [name], MD;  Location: [facility];  Service: Thoracic;  Laterality: Right;    REVIEW OF SYSTEMS:  A comprehensive review of systems was negative except for: Respiratory: positive for pleurisy/chest pain   PHYSICAL EXAMINATION: General appearance: alert, cooperative and no distress Head: Normocephalic, without obvious abnormality, atraumatic Neck: no adenopathy, no JVD, supple, symmetrical, trachea midline and thyroid not enlarged, symmetric, no tenderness/mass/nodules Lymph nodes: Cervical, supraclavicular, and axillary nodes normal. Resp: clear to auscultation bilaterally Back: symmetric, no curvature. ROM normal. No CVA tenderness. Cardio: regular rate and rhythm, S1, S2 normal, no murmur, click, rub or gallop GI: soft, non-tender; bowel sounds normal; no masses,  no organomegaly Extremities:  extremities normal, atraumatic, no cyanosis or edema  ECOG PERFORMANCE STATUS: 1 - Symptomatic but completely ambulatory  Height 5' 6"  (1.676 m), weight 262 lb 3.2 oz (118.9 kg).  LABORATORY DATA: Lab Results  Component Value Date   WBC 8.4 06/06/2018   HGB 13.3 06/06/2018   HCT 42.0 06/06/2018   MCV 79.4 (L) 06/06/2018   PLT 259 06/06/2018      Chemistry      Component Value Date/Time   NA 140 06/06/2018 0748   NA 141 05/29/2017 0921   K 4.3 06/06/2018 0748   K 4.8 05/29/2017 0921   CL 104 06/06/2018 0748   CO2 26 06/06/2018 0748   CO2 28 05/29/2017 0921   BUN 15 06/06/2018 0748   BUN 13.5 05/29/2017 0921   CREATININE 0.84 06/06/2018 0748   CREATININE 0.8 05/29/2017 0921      Component Value Date/Time   CALCIUM 9.4 06/06/2018 0748   CALCIUM 9.5 05/29/2017 0921   ALKPHOS 103 06/06/2018 0748  ALKPHOS 104 05/29/2017 0921   AST 17 06/06/2018 0748   AST 17 05/29/2017 0921   ALT 20 06/06/2018 0748   ALT 20 05/29/2017 0921   BILITOT 0.4 06/06/2018 0748   BILITOT 0.40 05/29/2017 0921       RADIOGRAPHIC STUDIES: Ct Chest W Contrast  Result Date: 06/06/2018 CLINICAL DATA:  Follow-up lung cancer. Status post right upper lobectomy. EXAM: CT CHEST WITH CONTRAST TECHNIQUE: Multidetector CT imaging of the chest was performed during intravenous contrast administration. CONTRAST:  40m OMNIPAQUE IOHEXOL 300 MG/ML  SOLN COMPARISON:  11/29/2017 FINDINGS: Cardiovascular: Normal heart size. No pericardial effusion identified. Aortic atherosclerosis. Mediastinum/Nodes: Normal appearance of the thyroid gland. The trachea appears patent and is midline. Normal appearance of the esophagus. No mediastinal or hilar adenopathy identified. Prominent right axillary lymph nodes are identified. Index lymph node measures 1.1 cm short axis, image 29/2. Previously 0.9 cm. Adjacent right axillary node measures 1.1 cm, image 33/2. Previously 0.7 cm. No supraclavicular or left axillary lymph nodes.  Lungs/Pleura: Postoperative change from right upper lobectomy identified. No complications identified. Unchanged tiny nodule in superior segment of left lower lobe measuring 3 mm, image 58/5. Small perifissural nodule in the posterior left upper lobe measures 3 mm, image 36/5. Unchanged from previous exam. Upper Abdomen: No acute abnormality identified. Right adrenal nodule is stable measuring 1.7 cm, image 138/2. Left adrenal nodule is also unchanged measuring 1.6 cm, image 139/2. Musculoskeletal: Spondylosis identified within the thoracic spine. No aggressive lytic or sclerotic bone lesions. IMPRESSION: 1. Stable appearance of the lungs status post right upper lobectomy. No findings identified to suggest local tumor recurrence. 2. There are 2 mildly enlarged lymph nodes within the right axilla which appear increased in size when compared with 11/29/2017. Etiology indeterminate. No mediastinal, hilar or supraclavicular adenopathy identified. 3.  Aortic Atherosclerosis (ICD10-I70.0). 4. Stable bilateral adrenal nodules Electronically Signed   By: TKerby MoorsM.D.   On: 06/06/2018 15:12   Mm 3d Screen Breast Bilateral  Result Date: 05/12/2018 CLINICAL DATA:  Screening. EXAM: DIGITAL SCREENING BILATERAL MAMMOGRAM WITH TOMO AND CAD COMPARISON:  Previous exam(s). ACR Breast Density Category a: The breast tissue is almost entirely fatty. FINDINGS: There are no findings suspicious for malignancy. Images were processed with CAD. IMPRESSION: No mammographic evidence of malignancy. A result letter of this screening mammogram will be mailed directly to the patient. RECOMMENDATION: Screening mammogram in one year. (Code:SM-B-01Y) BI-RADS CATEGORY  1: Negative. Electronically Signed   By: SCurlene DolphinM.D.   On: 05/12/2018 15:44    ASSESSMENT AND PLAN: This is a very pleasant 62years old white female with stage Ia non-small cell lung cancer status post right upper lobectomy with lymph node dissection. The patient  is currently on observation and she is feeling fine. She had repeat CT scan of the chest performed recently.  I personally and independently reviewed the scan images and discussed the result and showed the images to the patient today.  Her scan showed no concerning findings for disease recurrence or progression but there was a slightly enlarged right axillary lymph nodes that need close observation on upcoming scan.  She had a recent mammogram that was unremarkable for malignancy. I recommended for the patient to continue on observation with repeat CT scan of the chest in 6 months. She was advised to call immediately if she has any concerning symptoms in the interval. The patient voices understanding of current disease status and treatment options and is in agreement with the current care plan.  All questions were answered. The patient knows to call the clinic with any problems, questions or concerns. We can certainly see the patient much sooner if necessary.  I spent 10 minutes counseling the patient face to face. The total time spent in the appointment was 15 minutes.  Disclaimer: This note was dictated with voice recognition software. Similar sounding words can inadvertently be transcribed and may not be corrected upon review.

## 2018-06-10 NOTE — Telephone Encounter (Signed)
Printed avs and calender of upcoming appointment. Per 11/12 los

## 2018-06-12 ENCOUNTER — Ambulatory Visit: Payer: Self-pay | Admitting: Internal Medicine

## 2018-06-17 ENCOUNTER — Encounter: Payer: Self-pay | Admitting: Endocrinology

## 2018-06-17 ENCOUNTER — Ambulatory Visit: Payer: 59 | Admitting: Endocrinology

## 2018-06-17 VITALS — BP 132/70 | HR 73 | Ht 66.0 in | Wt 263.2 lb

## 2018-06-17 DIAGNOSIS — E119 Type 2 diabetes mellitus without complications: Secondary | ICD-10-CM | POA: Diagnosis not present

## 2018-06-17 DIAGNOSIS — Z794 Long term (current) use of insulin: Secondary | ICD-10-CM | POA: Diagnosis not present

## 2018-06-17 LAB — POCT GLYCOSYLATED HEMOGLOBIN (HGB A1C): Hemoglobin A1C: 6.9 % — AB (ref 4.0–5.6)

## 2018-06-17 NOTE — Progress Notes (Signed)
Subjective:    Patient ID: [name], female    DOB: April 19, 1956, 62 y.o.   MRN: [id number]  HPI Pt returns for f/u of diabetes mellitus: DM type: Insulin-requiring type 2.   Dx'ed: 6301 Complications: none Therapy: insulin since soon after dx, metformin, farxiga, bromocriptine, and trulicity.     GDM: never (G0) DKA: never Severe hypoglycemia: never.  Pancreatitis: never.  Other: she stopped victoza, due to lack of effect; she uses V-GO-40; metformin dosage has been limited by nausea.   Interval history: no recent steroids.  no cbg record, but states cbg's vary from 89-170.   She takes a total of approx 15 clicks per day, via the V-GO-40.   Past Medical History:  Diagnosis Date  . Adenocarcinoma of right lung, stage 1 ([place]) 11/29/2016  . Cancer ([place])   . Chronic pain   . Depression    takes Lexapro daily  . Diabetes mellitus    takes Metformin daily and has an insulin pump.Fasting blood sugar runs250  . GERD (gastroesophageal reflux disease)    takes Pantoprazole daily  . History of colon polyps    benign  . Hyperlipidemia    takes Atorvastatin daily  . Hypertension    takes Amlodipine and Lisinopril daily  . Insomnia    takes Trazodone nightly  . Nocturia   . Thyroid disease   . Urinary frequency   . Urinary urgency     Past Surgical History:  Procedure Laterality Date  . ABDOMINAL HYSTERECTOMY    . ABDOMINAL SURGERY    . COLONOSCOPY  2005 MAC   TICs, IH  . COLONOSCOPY N/A 03/08/2014   Procedure: COLONOSCOPY;  Surgeon: Danie Binder, MD;  [facility];  Service: Endoscopy;  Laterality: N/A;  1015  . CYSTOSTOMY  11/22/2011   Procedure: CYSTOSTOMY SUPRAPUBIC;  Surgeon: Marissa Nestle, MD;  Location: [facility];  Service: Urology;  Laterality: N/A;  . ESOPHAGOGASTRODUODENOSCOPY N/A 03/08/2014   Procedure: ESOPHAGOGASTRODUODENOSCOPY (EGD);  Surgeon: Danie Binder, MD;  [facility];  Service: Endoscopy;  Laterality: N/A;  .  ESOPHAGOGASTRODUODENOSCOPY    . HALLUX VALGUS CORRECTION     bilateral foot surgery for bone repairs-multiple  . LOBECTOMY Right 10/15/2016   Procedure: RIGHT UPPER LOBECTOMY;  Surgeon: [name], MD;  Location: [facility];  Service: Thoracic;  Laterality: Right;  . LYMPH NODE DISSECTION Right 10/15/2016   Procedure: LYMPH NODE DISSECTION;  Surgeon: [name], MD;  Location: [facility];  Service: Thoracic;  Laterality: Right;  Marland Kitchen VAGINA RECONSTRUCTION SURGERY     tvt 2006- [place]  . VIDEO ASSISTED THORACOSCOPY (VATS)/WEDGE RESECTION Right 10/15/2016   Procedure: RIGHT VIDEO ASSISTED THORACOSCOPY (VATS)/WEDGE RESECTION;  Surgeon: [name], MD;  Location: [facility];  Service: Thoracic;  Laterality: Right;    Social History   Socioeconomic History  . Marital status: Single    Spouse name: Not on file  . Number of children: Not on file  . Years of education: Not on file  . Highest education level: Not on file  Occupational History  . Not on file  Social Needs  . Financial resource strain: Not on file  . Food insecurity:    Worry: Not on file    Inability: Not on file  . Transportation needs:    Medical: Not on file    Non-medical: Not on file  Tobacco Use  . Smoking status: Former Smoker    Packs/day: 1.00  Years: 32.00    Pack years: 32.00    Types: Cigarettes  . Smokeless tobacco: Never Used  . Tobacco comment: quit smoking 15 yrs ago  Substance and Sexual Activity  . Alcohol use: No    Comment: rarely  . Drug use: No  . Sexual activity: Not Currently    Birth control/protection: Surgical  Lifestyle  . Physical activity:    Days per week: Not on file    Minutes per session: Not on file  . Stress: Not on file  Relationships  . Social connections:    Talks on phone: Not on file    Gets together: Not on file    Attends religious service: Not on file    Active member of club or organization: Not on file    Attends meetings of clubs or  organizations: Not on file    Relationship status: Not on file  . Intimate partner violence:    Fear of current or ex partner: Not on file    Emotionally abused: Not on file    Physically abused: Not on file    Forced sexual activity: Not on file  Other Topics Concern  . Not on file  Social History Narrative  . Not on file    Current Outpatient Medications on File Prior to Visit  Medication Sig Dispense Refill  . acetaminophen (TYLENOL) 500 MG tablet Take 1 tablet (500 mg total) by mouth every 6 (six) hours as needed. 30 tablet 0  . amLODipine (NORVASC) 5 MG tablet Take 5 mg by mouth daily at 6 PM.     . atorvastatin (LIPITOR) 20 MG tablet Take 20 mg by mouth daily.  11  . bromocriptine (PARLODEL) 2.5 MG tablet 1/4 tab daily 25 tablet 3  . Calcium Carb-Cholecalciferol (CALCIUM 600+D3 PO) Take 1 tablet by mouth 2 (two) times a week.    . chlorthalidone (HYGROTON) 25 MG tablet Take 25 mg by mouth as directed. Takes 1/2 tablet daily    . dapagliflozin propanediol (FARXIGA) 10 MG TABS tablet Take 10 mg by mouth daily. 30 tablet 11  . Dulaglutide (TRULICITY) 1.5 JK/0.9FG SOPN Inject 1.5 mg into the skin once a week. 4 pen 11  . escitalopram (LEXAPRO) 10 MG tablet Take 1 tablet (10 mg total) by mouth every morning. 90 tablet 3  . Insulin Disposable Pump (V-GO 40) KIT 1 Device by Does not apply route daily. 90 kit 3  . insulin lispro (HUMALOG) 100 UNIT/ML injection For use in pump, total of 70 units per day 90 mL 3  . liraglutide (VICTOZA) 18 MG/3ML SOPN INJECT 1.2 MG SUBCUTANEOUSLY DAILY    . lisinopril (PRINIVIL,ZESTRIL) 40 MG tablet Take 40 mg by mouth daily.    . metFORMIN (GLUCOPHAGE-XR) 500 MG 24 hr tablet TAKE 1 TABLET (500 MG TOTAL) BY MOUTH 2 (TWO) TIMES DAILY. 180 tablet 3  . Omega-3 Fatty Acids (FISH OIL) 600 MG CAPS Take 1,200 mg by mouth at bedtime.    Glory Rosebush VERIO test strip USE TO TEST BLOOD SUGAR TWICE A DAY 100 each 5  . pantoprazole (PROTONIX) 40 MG tablet Take 40 mg by  mouth daily.    . traZODone (DESYREL) 100 MG tablet Take 2 tablets (200 mg total) by mouth at bedtime. 180 tablet 3  . YUVAFEM 10 MCG TABS vaginal tablet Place 10 mcg vaginally See admin instructions. Twice a week as needed for vaginal dryness  11   No current facility-administered medications on file prior to visit.  Allergies  Allergen Reactions  . Synthroid [Levothyroxine Sodium] Anaphylaxis  . Iodine Hives  . Povidone-Iodine Itching and Rash    Family History  Problem Relation Age of Onset  . Asthma Unknown   . Diabetes Unknown   . Arthritis Unknown   . Cancer Mother   . Cancer Brother   . Anesthesia problems Neg Hx   . Hypotension Neg Hx   . Malignant hyperthermia Neg Hx   . Pseudochol deficiency Neg Hx   . Colon polyps Neg Hx   . Colon cancer Neg Hx   . Celiac disease Neg Hx   . Pancreatic cancer Neg Hx   . Stomach cancer Neg Hx   . Ulcerative colitis Neg Hx   . Crohn's disease Neg Hx     BP 132/70 (BP Location: Left Arm, Patient Position: Sitting, Cuff Size: Large)   Pulse 73   Ht _0  (1.676 m)   Wt 263 lb 3.2 oz (119.4 kg)   SpO2 95%   BMI 42.48 kg/m    Review of Systems She denies hypoglycemia.      Objective:   Physical Exam VITAL SIGNS:  See vs page GENERAL: no distress Pulses: dorsalis pedis intact bilat.   MSK: no deformity of the feet CV: no leg edema Skin:  no ulcer on the feet, but the skin is dry.  normal color and temp on the feet. Neuro: sensation is intact to touch on the feet.   Ext: There is bilateral onychomycosis of the toenails.  A1c=6.9%     Assessment & Plan:  Insulin-requiring type 2 DM: well-controlled.   Nausea: this limits metformin dosage  Patient Instructions  please continue the same diabetes medications.  check your blood sugar twice a day.  vary the time of day when you check, between before the 3 meals, and at bedtime.  also check if you have symptoms of your blood sugar being too high or too low.  please keep  a record of the readings and bring it to your next appointment here (or you can bring the meter itself).  You can write it on any piece of paper.  please call us sooner if your blood sugar goes below 70, or if you have a lot of readings over 200.   Please come back for a follow-up appointment in 4 months.

## 2018-06-17 NOTE — Patient Instructions (Addendum)
please continue the same diabetes medications.  check your blood sugar twice a day.  vary the time of day when you check, between before the 3 meals, and at bedtime.  also check if you have symptoms of your blood sugar being too high or too low.  please keep a record of the readings and bring it to your next appointment here (or you can bring the meter itself).  You can write it on any piece of paper.  please call us sooner if your blood sugar goes below 70, or if you have a lot of readings over 200.   Please come back for a follow-up appointment in 4 months.

## 2018-06-23 ENCOUNTER — Ambulatory Visit (HOSPITAL_COMMUNITY): Payer: 59 | Admitting: Psychiatry

## 2018-06-23 ENCOUNTER — Encounter (HOSPITAL_COMMUNITY): Payer: Self-pay | Admitting: Psychiatry

## 2018-06-23 VITALS — BP 140/75 | HR 96 | Ht 66.0 in | Wt 266.0 lb

## 2018-06-23 DIAGNOSIS — F331 Major depressive disorder, recurrent, moderate: Secondary | ICD-10-CM

## 2018-06-23 MED ORDER — ESCITALOPRAM OXALATE 10 MG PO TABS: 10.0000 mg | ORAL_TABLET | Freq: Every morning | ORAL | 3 refills | Status: DC

## 2018-06-23 MED ORDER — TRAZODONE HCL 100 MG PO TABS: 200.0000 mg | ORAL_TABLET | Freq: Every day | ORAL | 3 refills | Status: DC
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
## 2018-06-25 ENCOUNTER — Other Ambulatory Visit: Payer: Self-pay | Admitting: Endocrinology

## 2018-06-28 IMAGING — CT NM PET TUM IMG INITIAL (PI) SKULL BASE T - THIGH
1 of 7 series · 1 of 25 positions shown · non-contrast
Comparison: CT chest 09/14/2016

CLINICAL DATA: Initial treatment strategy for lung nodule.

EXAM:
NUCLEAR MEDICINE PET SKULL BASE TO THIGH
TECHNIQUE: 12.5 mCi F-18 FDG was injected intravenously. Full-ring PET imaging
was performed from the skull base to thigh after the radiotracer. CT
data was obtained and used for attenuation correction and anatomic
localization.
FASTING BLOOD GLUCOSE:  Value: 244 mg/dl

[Series 4: ct sk_thigh 5.0 hd_fov · axial · 5.0mm · 1.13mm/px · 1 of 222 slices shown]
[im 222/222  brain]
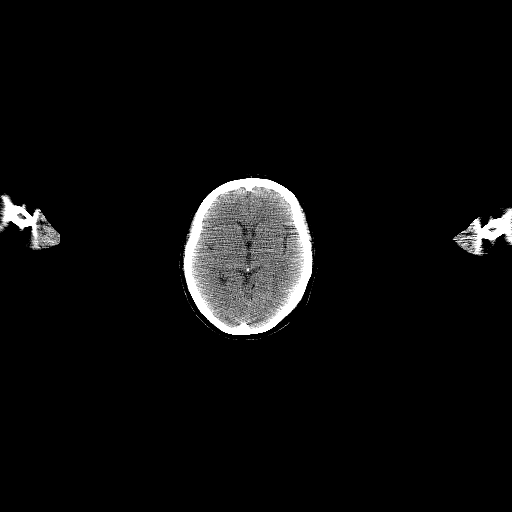

[1 of 25 positions shown; findings below may reference images not displayed]

FINDINGS: NECK

No hypermetabolic lymph nodes in the neck.

CHEST

The 15 mm spiculated posterior right upper lobe pulmonary nodule is
hypermetabolic with SUV max = 3. No evidence for hypermetabolic
activity and mediastinal or hilar lymph nodes. No evidence for
lymphadenopathy in the mediastinum or right hilum by CT imaging.

Atherosclerotic calcification noted in the thoracic aorta without
aneurysm.

ABDOMEN/PELVIS

No abnormal hypermetabolic activity within the liver, pancreas,
adrenal glands, or spleen. No hypermetabolic lymph nodes in the
abdomen or pelvis.

16 mm right adrenal nodule, reportedly stable since 5515 shows no
evidence for hypermetabolism on PET imaging. Imaging features remain
compatible with adenoma.

Atherosclerotic calcification noted abdominal aorta without
aneurysm. Diverticular changes are noted diffusely in the colon
without diverticulitis. Uterus surgically absent.

SKELETON

No focal hypermetabolic activity to suggest skeletal metastasis.
IMPRESSION: 15 mm spiculated posterior right upper lobe pulmonary nodule is
hypermetabolic, consistent with primary bronchogenic neoplasm. No
evidence for metastatic disease in the chest, abdomen, or pelvis.

No evidence for FDG accumulation in the 16 mm right adrenal nodule.
This is been stable since 5515, by report, and remains compatible
with adenoma.

## 2018-08-19 ENCOUNTER — Other Ambulatory Visit: Payer: Self-pay

## 2018-08-19 ENCOUNTER — Other Ambulatory Visit: Payer: Self-pay | Admitting: Endocrinology

## 2018-08-19 DIAGNOSIS — Z794 Long term (current) use of insulin: Principal | ICD-10-CM

## 2018-08-19 DIAGNOSIS — E119 Type 2 diabetes mellitus without complications: Secondary | ICD-10-CM

## 2018-08-19 MED ORDER — GLUCOSE BLOOD VI STRP: ORAL_STRIP | 12 refills | Status: DC

## 2018-08-19 MED ORDER — ACCU-CHEK AVIVA PLUS W/DEVICE KIT: 1.0000 | PACK | Freq: Two times a day (BID) | 0 refills | Status: DC

## 2018-08-19 NOTE — Telephone Encounter (Signed)
Per patient insurance is changing RX coverage and she needs to discuss the changes and how to proceed.  Requests a call back to [phone]

## 2018-08-19 NOTE — Telephone Encounter (Signed)
Returned pt call. States meter and strips are no long er covered. Switching to Accu check Aviva plus and compatible test strips. Rx's sent today as requested

## 2018-08-20 ENCOUNTER — Telehealth: Payer: Self-pay | Admitting: Endocrinology

## 2018-08-20 NOTE — Telephone Encounter (Signed)
[facility] indicated insulin will need to be changed to Insulin [facility] (covered by pt insurance). Per Dr. Loanne Drilling, okay to refill as needed. After review of pt medications, pt is taking Humalog via pump. Called to clarify if change required is for Novolog. LVM requesting returned call.

## 2018-08-20 NOTE — Telephone Encounter (Signed)
Patient called [facility] @ 12:58pm stated    "that she needed to leave a message for the nurse because she called the insurance company for her RX and she needs to type in that generic is allowed and the nurse needs to [facility] this [facility] for accu check device"

## 2018-08-20 NOTE — Telephone Encounter (Signed)
Pt returned call. Advised of letter and need for clarification re: change requested by insurance company. Advised she is CURRENTLY on Insulin Lispro (Humalog). Asked that she call her insurance company for clarification Novolog vs Humalog coverage. States she will call back with clarification.

## 2018-08-21 ENCOUNTER — Other Ambulatory Visit: Payer: Self-pay

## 2018-08-21 DIAGNOSIS — E119 Type 2 diabetes mellitus without complications: Secondary | ICD-10-CM

## 2018-08-21 DIAGNOSIS — Z794 Long term (current) use of insulin: Principal | ICD-10-CM

## 2018-08-21 MED ORDER — INSULIN LISPRO 100 UNIT/ML ~~LOC~~ SOLN: SUBCUTANEOUS | 3 refills | Status: DC

## 2018-08-21 MED ORDER — ACCU-CHEK SOFTCLIX LANCETS MISC: 12 refills | Status: DC

## 2018-08-21 MED ORDER — GLUCOSE CONTROL VI SOLN: 1.0000 | 1 refills | Status: DC | PRN

## 2018-08-21 NOTE — Telephone Encounter (Signed)
"  that she needed to leave a message for the nurse because she called the insurance company for her RX and she needs to type in that generic is allowed and the nurse [facility]" [id number] [place] [place] [facility] [address] lis[address] (HUMALOG) 100 UNIT/ML injection 90 mL 3 08/21/2018    Sig: For use in pump, total of 70 units per day; [facility] BE [facility] [place]: insulin lispro (HUMALOG) 100 UNIT/ML injection   E-Prescribing Status: Receipt confirmed by pharmacy (08/21/2018  9:44 AM EST)  Blood Glucose Monitoring Suppl (ACCU-CHEK AVIVA PLUS) w/Device KIT 1 kit 0 08/19/2018    Sig - Route: 1 each by Does not apply route 2 (two) times daily. Use to monitor glucose levels BID; E11.9 - Does not apply   Sent to pharmacy as: Blood Glucose Monitoring Suppl (ACCU-CHEK AVIVA PLUS) w/Device Kit   E-Prescribing Status: Receipt confirmed by pharmacy (08/19/2018 11:51 AM EST)    glucose blood (ACCU-CHEK AVIVA PLUS) test strip 100 each 12 08/19/2018    Sig: Use to monitor glucose levels BID; E11.9   Sent to pharmacy as: glucose blood (ACCU-CHEK AVIVA PLUS) test strip    E-Prescribing Status: Receipt confirmed by pharmacy (08/19/2018 11:51 AM EST)   ACCU-CHEK SOFTCLIX LANCETS lancets 100 each 12 08/21/2018    Sig: Use to monitor glucose levels BID; E11.9   Sent to pharmacy as: ACCU-CHEK SOFTCLIX LANCETS lancets   E-Prescribing Status: Receipt confirmed by pharmacy (08/21/2018  9:51 AM EST)

## 2018-08-21 NOTE — Telephone Encounter (Signed)
[id number] [place], [place] - [address]         insulin lispro (HUMALOG) 100 UNIT/ML injection 90 mL 3 08/21/2018      Sig: For use in pump, total of 70 units per day; MUST BE GENERIC INSULIN LIPRO PER [employer] PREFERRANCE     Sent to pharmacy as: insulin lispro (HUMALOG) 100 UNIT/ML injection     E-Prescribing Status: Receipt confirmed by pharmacy (08/21/2018 9:44 AM EST)  Blood Glucose Monitoring Suppl (ACCU-CHEK AVIVA PLUS) w/Device KIT 1 kit 0 08/19/2018    Sig - Route: 1 each by Does not apply route 2 (two) times daily. Use to monitor glucose levels BID; E11.9 - Does not apply   Sent to pharmacy as: Blood Glucose Monitoring Suppl (ACCU-CHEK AVIVA PLUS) w/Device Kit   E-Prescribing Status: Receipt confirmed by pharmacy (08/19/2018 11:51 AM EST)                glucose blood (ACCU-CHEK AVIVA PLUS) test strip 100 each 12 08/19/2018     Sig: Use to monitor glucose levels BID; E11.9    Sent to pharmacy as: glucose blood (ACCU-CHEK AVIVA PLUS) test strip     E-Prescribing Status: Receipt confirmed by pharmacy (08/19/2018 11:51 AM EST)    ACCU-CHEK SOFTCLIX LANCETS lancets 100 each 12 08/21/2018    Sig: Use to monitor glucose levels BID; E11.9   Sent to pharmacy as: ACCU-CHEK SOFTCLIX LANCETS lancets   E-Prescribing Status: Receipt confirmed by pharmacy (08/21/2018 9:51 AM EST)

## 2018-08-26 ENCOUNTER — Telehealth: Payer: Self-pay | Admitting: Endocrinology

## 2018-08-26 NOTE — Telephone Encounter (Signed)
[facility] to our office for [place] to send Another prescription for insulin.   She would like a call back to discuss this.   Please advise

## 2018-08-27 NOTE — Telephone Encounter (Signed)
insulin lispro (HUMALOG) 100 UNIT/ML injection 90 mL 3 08/21/2018    Sig: For use in pump, total of 70 units per day; MUST BE GENERIC INSULIN LISPRO PER [employer] PREFERENCE   Sent to pharmacy as: insulin lispro (HUMALOG) 100 UNIT/ML injection   E-Prescribing Status: Receipt confirmed by pharmacy (08/21/2018  2:19 PM EST)    [facility] [place] [place] [facility]  Based on above information, not certain about pt call and/or concern. No document has been [facility] from [facility] as pt had stated in her call. Uncertain if she is referring to refill of Trulicity. Called pt for clarification. LVM requesting returned call.

## 2018-08-28 ENCOUNTER — Other Ambulatory Visit: Payer: Self-pay | Admitting: Endocrinology

## 2018-08-28 ENCOUNTER — Other Ambulatory Visit: Payer: Self-pay

## 2018-08-28 DIAGNOSIS — E119 Type 2 diabetes mellitus without complications: Secondary | ICD-10-CM

## 2018-08-28 DIAGNOSIS — Z794 Long term (current) use of insulin: Principal | ICD-10-CM

## 2018-08-28 MED ORDER — INSULIN ASPART 100 UNIT/ML ~~LOC~~ SOLN: SUBCUTANEOUS | 99 refills | Status: DC

## 2018-08-28 NOTE — Telephone Encounter (Signed)
Pt returned call. States she received a call from [facility], apologizing for the confusion they had created with regards to refilling her insulin. States, upon further review of pt plan, Insulin Lispro/Humalog, while covered by her plan, pt out of pocket expense would be >$600.00. Supervisor further added, Novolog is the covered medication on her plan that would not require an out of pocket expense. Rx has been [facility] for Novolog, same dosage. Advised pt of confirmation and to contact [facility] re: receipt and status of this refill. Verbalized acceptance and understanding.

## 2018-09-05 ENCOUNTER — Other Ambulatory Visit: Payer: Self-pay | Admitting: Family Medicine

## 2018-09-05 DIAGNOSIS — N644 Mastodynia: Secondary | ICD-10-CM

## 2018-09-16 ENCOUNTER — Encounter: Payer: Self-pay | Admitting: Endocrinology

## 2018-10-16 ENCOUNTER — Encounter: Payer: Self-pay | Admitting: Endocrinology

## 2018-10-16 ENCOUNTER — Other Ambulatory Visit: Payer: Self-pay

## 2018-10-16 ENCOUNTER — Ambulatory Visit: Payer: 59 | Admitting: Endocrinology

## 2018-10-16 VITALS — BP 124/80 | HR 92 | Ht 66.0 in | Wt 258.6 lb

## 2018-10-16 DIAGNOSIS — Z794 Long term (current) use of insulin: Secondary | ICD-10-CM | POA: Diagnosis not present

## 2018-10-16 DIAGNOSIS — E119 Type 2 diabetes mellitus without complications: Secondary | ICD-10-CM | POA: Diagnosis not present

## 2018-10-16 LAB — POCT GLYCOSYLATED HEMOGLOBIN (HGB A1C): Hemoglobin A1C: 7.5 % — AB (ref 4.0–5.6)

## 2018-10-16 MED ORDER — BROMOCRIPTINE MESYLATE 2.5 MG PO TABS: 2.5000 mg | ORAL_TABLET | Freq: Every day | ORAL | 11 refills | Status: DC

## 2018-10-16 NOTE — Patient Instructions (Addendum)
I have sent a prescription to your pharmacy, to increase the bromocriptine. please continue the same other diabetes medications.  check your blood sugar twice a day.  vary the time of day when you check, between before the 3 meals, and at bedtime.  also check if you have symptoms of your blood sugar being too high or too low.  please keep a record of the readings and bring it to your next appointment here (or you can bring the meter itself).  You can write it on any piece of paper.  please call us sooner if your blood sugar goes below 70, or if you have a lot of readings over 200.   Please come back for a follow-up appointment in 3 months.

## 2018-10-16 NOTE — Progress Notes (Signed)
Subjective:    Patient ID: [name], female    DOB: 1955-12-12, 63 y.o.   MRN: [id number]  HPI Pt returns for f/u of diabetes mellitus: DM type: Insulin-requiring type 2.   Dx'ed: 7829 Complications: none Therapy: insulin since soon after dx, metformin, farxiga, bromocriptine, and trulicity.     GDM: never (G0) DKA: never Severe hypoglycemia: never.  Pancreatitis: never.  Other: she stopped victoza, due to lack of effect; she uses V-GO-40; metformin dosage is limited by nausea.   Interval history: no recent steroids.  Meter is downloaded today, and the printout is scanned into the record.  cbg's vary from 113-256.   It is in general higher as the day goes on, but not necessarily so.  She takes a total of approx 15 clicks per day, via the V-GO-40.   Past Medical History:  Diagnosis Date  . Adenocarcinoma of right lung, stage 1 ([place]) 11/29/2016  . Cancer ([place])   . Chronic pain   . Depression    takes Lexapro daily  . Diabetes mellitus    takes Metformin daily and has an insulin pump.Fasting blood sugar runs250  . GERD (gastroesophageal reflux disease)    takes Pantoprazole daily  . History of colon polyps    benign  . Hyperlipidemia    takes Atorvastatin daily  . Hypertension    takes Amlodipine and Lisinopril daily  . Insomnia    takes Trazodone nightly  . Nocturia   . Thyroid disease   . Urinary frequency   . Urinary urgency     Past Surgical History:  Procedure Laterality Date  . ABDOMINAL HYSTERECTOMY    . ABDOMINAL SURGERY    . COLONOSCOPY  2005 MAC   TICs, IH  . COLONOSCOPY N/A 03/08/2014   Procedure: COLONOSCOPY;  Surgeon: Danie Binder, MD;  [facility];  Service: Endoscopy;  Laterality: N/A;  1015  . CYSTOSTOMY  11/22/2011   Procedure: CYSTOSTOMY SUPRAPUBIC;  Surgeon: Marissa Nestle, MD;  Location: [facility];  Service: Urology;  Laterality: N/A;  . ESOPHAGOGASTRODUODENOSCOPY N/A 03/08/2014   Procedure: ESOPHAGOGASTRODUODENOSCOPY (EGD);   Surgeon: Danie Binder, MD;  [facility];  Service: Endoscopy;  Laterality: N/A;  . ESOPHAGOGASTRODUODENOSCOPY    . HALLUX VALGUS CORRECTION     bilateral foot surgery for bone repairs-multiple  . LOBECTOMY Right 10/15/2016   Procedure: RIGHT UPPER LOBECTOMY;  Surgeon: [name], MD;  Location: [facility];  Service: Thoracic;  Laterality: Right;  . LYMPH NODE DISSECTION Right 10/15/2016   Procedure: LYMPH NODE DISSECTION;  Surgeon: [name], MD;  Location: [facility];  Service: Thoracic;  Laterality: Right;  [facility] VAGINA RECONSTRUCTION SURGERY     tvt 2006- [place]  . VIDEO ASSISTED THORACOSCOPY (VATS)/WEDGE RESECTION Right 10/15/2016   Procedure: RIGHT VIDEO ASSISTED THORACOSCOPY (VATS)/WEDGE RESECTION;  Surgeon: [name], MD;  Location: [facility];  Service: Thoracic;  Laterality: Right;    Social History   Socioeconomic History  . Marital status: Single    Spouse name: Not on file  . Number of children: Not on file  . Years of education: Not on file  . Highest education level: Not on file  Occupational History  . Not on file  Social Needs  . Financial resource strain: Not on file  . Food insecurity:    Worry: Not on file    Inability: Not on file  . Transportation needs:    Medical: Not on file  Non-medical: Not on file  Tobacco Use  . Smoking status: Former Smoker    Packs/day: 1.00    Years: 32.00    Pack years: 32.00    Types: Cigarettes  . Smokeless tobacco: Never Used  . Tobacco comment: quit smoking 15 yrs ago  Substance and Sexual Activity  . Alcohol use: No    Comment: rarely  . Drug use: No  . Sexual activity: Not Currently    Birth control/protection: Surgical  Lifestyle  . Physical activity:    Days per week: Not on file    Minutes per session: Not on file  . Stress: Not on file  Relationships  . Social connections:    Talks on phone: Not on file    Gets together: Not on file    Attends religious service: Not on  file    Active member of club or organization: Not on file    Attends meetings of clubs or organizations: Not on file    Relationship status: Not on file  . Intimate partner violence:    Fear of current or ex partner: Not on file    Emotionally abused: Not on file    Physically abused: Not on file    Forced sexual activity: Not on file  Other Topics Concern  . Not on file  Social History Narrative  . Not on file    Current Outpatient Medications on File Prior to Visit  Medication Sig Dispense Refill  . ACCU-CHEK SOFTCLIX LANCETS lancets Use to monitor glucose levels BID; E11.9 100 each 12  . acetaminophen (TYLENOL) 500 MG tablet Take 1 tablet (500 mg total) by mouth every 6 (six) hours as needed. 30 tablet 0  . amLODipine (NORVASC) 5 MG tablet Take 5 mg by mouth daily at 6 PM.     . atorvastatin (LIPITOR) 20 MG tablet Take 20 mg by mouth daily.  11  . Blood Glucose Calibration (GLUCOSE CONTROL) SOLN 1 Bottle by In Vitro route as needed. Use to calibrate Accu-Chek Aviva Plus device prn; please provide control solution compatible with Accu-Chek Aviva Plus device. 1 each 1  . Blood Glucose Monitoring Suppl (ACCU-CHEK AVIVA PLUS) w/Device KIT 1 each by Does not apply route 2 (two) times daily. Use to monitor glucose levels BID; E11.9 1 kit 0  . Calcium Carb-Cholecalciferol (CALCIUM 600+D3 PO) Take 1 tablet by mouth 2 (two) times a week.    . chlorthalidone (HYGROTON) 25 MG tablet Take 25 mg by mouth as directed. Takes 1/2 tablet daily    . dapagliflozin propanediol (FARXIGA) 10 MG TABS tablet Take 10 mg by mouth daily. 30 tablet 11  . Dulaglutide 1.5 MG/0.5ML SOPN INJECT 1.5 MG INTO THE SKIN ONCE A WEEK. 2 pen 35  . escitalopram (LEXAPRO) 10 MG tablet Take 1 tablet (10 mg total) by mouth every morning. 90 tablet 3  . glucose blood (ACCU-CHEK AVIVA PLUS) test strip Use to monitor glucose levels BID; E11.9 100 each 12  . insulin aspart (NOVOLOG) 100 UNIT/ML injection For use in pump, total of  70 units per day 3 vial PRN  . Insulin Disposable Pump (V-GO 40) KIT 1 Device by Does not apply route daily. 90 kit 3  . lisinopril (PRINIVIL,ZESTRIL) 40 MG tablet Take 40 mg by mouth daily.    . metFORMIN (GLUCOPHAGE-XR) 500 MG 24 hr tablet TAKE 1 TABLET (500 MG TOTAL) BY MOUTH 2 (TWO) TIMES DAILY. 180 tablet 3  . Omega-3 Fatty Acids (FISH OIL) 600 MG CAPS Take  1,200 mg by mouth at bedtime.    . pantoprazole (PROTONIX) 40 MG tablet Take 40 mg by mouth daily.    . traZODone (DESYREL) 100 MG tablet Take 2 tablets (200 mg total) by mouth at bedtime. 180 tablet 3  . YUVAFEM 10 MCG TABS vaginal tablet Place 10 mcg vaginally See admin instructions. Twice a week as needed for vaginal dryness  11   No current facility-administered medications on file prior to visit.     Allergies  Allergen Reactions  . Synthroid [Levothyroxine Sodium] Anaphylaxis  . Iodine Hives  . Povidone-Iodine Itching and Rash    Family History  Problem Relation Age of Onset  . Asthma Unknown   . Diabetes Unknown   . Arthritis Unknown   . Cancer Mother   . Cancer Brother   . Anesthesia problems Neg Hx   . Hypotension Neg Hx   . Malignant hyperthermia Neg Hx   . Pseudochol deficiency Neg Hx   . Colon polyps Neg Hx   . Colon cancer Neg Hx   . Celiac disease Neg Hx   . Pancreatic cancer Neg Hx   . Stomach cancer Neg Hx   . Ulcerative colitis Neg Hx   . Crohn's disease Neg Hx     BP 124/80 (BP Location: Right Arm, Patient Position: Sitting, Cuff Size: Large)   Pulse 92   Ht _0  (1.676 m)   Wt 258 lb 9.6 oz (117.3 kg)   SpO2 94%   BMI 41.74 kg/m    Review of Systems She denies hypoglycemia.  She has lost a few lbs.     Objective:   Physical Exam VITAL SIGNS:  See vs page GENERAL: no distress Pulses: dorsalis pedis intact bilat.   MSK: no deformity of the feet CV: no leg edema Skin:  no ulcer on the feet, but the skin is dry.  normal color and temp on the feet. Neuro: sensation is intact to touch  on the feet.   Ext: There is bilateral onychomycosis of the toenails.  Lab Results  Component Value Date   HGBA1C 7.5 (A) 10/16/2018        Assessment & Plan:  Insulin-requiring type 2 DM: worse.  Nausea: this has limited metformin dosage--we'll follow on increased Bromocriptine.   Patient Instructions  I have sent a prescription to your pharmacy, to increase the bromocriptine. please continue the same other diabetes medications.  check your blood sugar twice a day.  vary the time of day when you check, between before the 3 meals, and at bedtime.  also check if you have symptoms of your blood sugar being too high or too low.  please keep a record of the readings and bring it to your next appointment here (or you can bring the meter itself).  You can write it on any piece of paper.  please call us sooner if your blood sugar goes below 70, or if you have a lot of readings over 200.   Please come back for a follow-up appointment in 3 months.

## 2018-11-26 ENCOUNTER — Telehealth: Payer: Self-pay | Admitting: Internal Medicine

## 2018-11-26 NOTE — Telephone Encounter (Signed)
Returned patient's phone call regarding rescheduling an appointment, per patient's request lab appointment has been rescheduled to the same day as CT scan.

## 2018-12-04 ENCOUNTER — Other Ambulatory Visit: Payer: Self-pay | Admitting: Endocrinology

## 2018-12-05 ENCOUNTER — Other Ambulatory Visit: Payer: Self-pay

## 2018-12-08 ENCOUNTER — Ambulatory Visit (HOSPITAL_COMMUNITY)
Admission: RE | Admit: 2018-12-08 | Discharge: 2018-12-08 | Disposition: A | Discharge status: Home / Self Care | Payer: 59 | Source: Ambulatory Visit | Attending: Internal Medicine | Admitting: Internal Medicine

## 2018-12-08 ENCOUNTER — Other Ambulatory Visit: Payer: Self-pay

## 2018-12-08 ENCOUNTER — Inpatient Hospital Stay: Payer: 59 | Attending: Internal Medicine

## 2018-12-08 ENCOUNTER — Encounter (HOSPITAL_COMMUNITY): Payer: Self-pay

## 2018-12-08 DIAGNOSIS — Z86010 Personal history of colonic polyps: Secondary | ICD-10-CM | POA: Diagnosis not present

## 2018-12-08 DIAGNOSIS — Z79899 Other long term (current) drug therapy: Secondary | ICD-10-CM | POA: Diagnosis not present

## 2018-12-08 DIAGNOSIS — F329 Major depressive disorder, single episode, unspecified: Secondary | ICD-10-CM | POA: Insufficient documentation

## 2018-12-08 DIAGNOSIS — R3915 Urgency of urination: Secondary | ICD-10-CM | POA: Insufficient documentation

## 2018-12-08 DIAGNOSIS — C3490 Malignant neoplasm of unspecified part of unspecified bronchus or lung: Secondary | ICD-10-CM | POA: Insufficient documentation

## 2018-12-08 DIAGNOSIS — I10 Essential (primary) hypertension: Secondary | ICD-10-CM | POA: Diagnosis not present

## 2018-12-08 DIAGNOSIS — G8929 Other chronic pain: Secondary | ICD-10-CM | POA: Insufficient documentation

## 2018-12-08 DIAGNOSIS — E785 Hyperlipidemia, unspecified: Secondary | ICD-10-CM | POA: Diagnosis not present

## 2018-12-08 DIAGNOSIS — R350 Frequency of micturition: Secondary | ICD-10-CM | POA: Diagnosis not present

## 2018-12-08 DIAGNOSIS — K219 Gastro-esophageal reflux disease without esophagitis: Secondary | ICD-10-CM | POA: Insufficient documentation

## 2018-12-08 DIAGNOSIS — C3411 Malignant neoplasm of upper lobe, right bronchus or lung: Secondary | ICD-10-CM | POA: Diagnosis not present

## 2018-12-08 DIAGNOSIS — G4700 Insomnia, unspecified: Secondary | ICD-10-CM | POA: Diagnosis not present

## 2018-12-08 DIAGNOSIS — E119 Type 2 diabetes mellitus without complications: Secondary | ICD-10-CM | POA: Diagnosis not present

## 2018-12-08 DIAGNOSIS — Z794 Long term (current) use of insulin: Secondary | ICD-10-CM | POA: Insufficient documentation

## 2018-12-08 LAB — CBC WITH DIFFERENTIAL (CANCER CENTER ONLY)
Abs Immature Granulocytes: 0.04 10*3/uL (ref 0.00–0.07)
Basophils Absolute: 0.1 10*3/uL (ref 0.0–0.1)
Basophils Relative: 1 %
Eosinophils Absolute: 0.5 10*3/uL (ref 0.0–0.5)
Eosinophils Relative: 5 %
HCT: 35.3 % — ABNORMAL LOW (ref 36.0–46.0)
Hemoglobin: 10.3 g/dL — ABNORMAL LOW (ref 12.0–15.0)
Immature Granulocytes: 0 %
Lymphocytes Relative: 25 %
Lymphs Abs: 2.3 10*3/uL (ref 0.7–4.0)
MCH: 21.4 pg — ABNORMAL LOW (ref 26.0–34.0)
MCHC: 29.2 g/dL — ABNORMAL LOW (ref 30.0–36.0)
MCV: 73.2 fL — ABNORMAL LOW (ref 80.0–100.0)
Monocytes Absolute: 0.5 10*3/uL (ref 0.1–1.0)
Monocytes Relative: 6 %
Neutro Abs: 5.7 10*3/uL (ref 1.7–7.7)
Neutrophils Relative %: 63 %
Platelet Count: 286 10*3/uL (ref 150–400)
RBC: 4.82 MIL/uL (ref 3.87–5.11)
RDW: 15.2 % (ref 11.5–15.5)
WBC Count: 9 10*3/uL (ref 4.0–10.5)
nRBC: 0 % (ref 0.0–0.2)

## 2018-12-08 LAB — CMP (CANCER CENTER ONLY)
ALT: 18 U/L (ref 0–44)
AST: 14 U/L — ABNORMAL LOW (ref 15–41)
Albumin: 3.6 g/dL (ref 3.5–5.0)
Alkaline Phosphatase: 96 U/L (ref 38–126)
Anion gap: 9 (ref 5–15)
BUN: 17 mg/dL (ref 8–23)
CO2: 26 mmol/L (ref 22–32)
Calcium: 9 mg/dL (ref 8.9–10.3)
Chloride: 105 mmol/L (ref 98–111)
Creatinine: 0.83 mg/dL (ref 0.44–1.00)
GFR, Est AFR Am: >60 mL/min (ref >60)
GFR, Est Non Af Am: >60 mL/min (ref >60)
Glucose, Bld: 144 mg/dL — ABNORMAL HIGH (ref 70–99)
Potassium: 4.2 mmol/L (ref 3.5–5.1)
Sodium: 140 mmol/L (ref 135–145)
Total Bilirubin: 0.4 mg/dL (ref 0.3–1.2)
Total Protein: 6.5 g/dL (ref 6.5–8.1)

## 2018-12-08 MED ORDER — SODIUM CHLORIDE (PF) 0.9 % IJ SOLN
INTRAMUSCULAR | Status: AC
  Filled 2018-12-08: qty 50

## 2018-12-08 MED ORDER — IOHEXOL 300 MG/ML  SOLN
75.0000 mL | Freq: Once | INTRAMUSCULAR | Status: AC | PRN
  Administered 2018-12-08: 14:00:00 75 mL via INTRAVENOUS

## 2018-12-09 ENCOUNTER — Inpatient Hospital Stay (HOSPITAL_BASED_OUTPATIENT_CLINIC_OR_DEPARTMENT_OTHER): Payer: 59 | Admitting: Internal Medicine

## 2018-12-09 ENCOUNTER — Other Ambulatory Visit: Payer: Self-pay

## 2018-12-09 ENCOUNTER — Encounter: Payer: Self-pay | Admitting: Internal Medicine

## 2018-12-09 VITALS — BP 123/60 | HR 72 | Temp 97.9°F | Resp 17 | Ht 66.0 in | Wt 262.0 lb

## 2018-12-09 DIAGNOSIS — F329 Major depressive disorder, single episode, unspecified: Secondary | ICD-10-CM

## 2018-12-09 DIAGNOSIS — G8929 Other chronic pain: Secondary | ICD-10-CM | POA: Diagnosis not present

## 2018-12-09 DIAGNOSIS — C3411 Malignant neoplasm of upper lobe, right bronchus or lung: Secondary | ICD-10-CM | POA: Diagnosis not present

## 2018-12-09 DIAGNOSIS — Z79899 Other long term (current) drug therapy: Secondary | ICD-10-CM

## 2018-12-09 DIAGNOSIS — C3491 Malignant neoplasm of unspecified part of right bronchus or lung: Secondary | ICD-10-CM

## 2018-12-09 DIAGNOSIS — C3490 Malignant neoplasm of unspecified part of unspecified bronchus or lung: Secondary | ICD-10-CM

## 2018-12-09 DIAGNOSIS — E785 Hyperlipidemia, unspecified: Secondary | ICD-10-CM

## 2018-12-09 DIAGNOSIS — I10 Essential (primary) hypertension: Secondary | ICD-10-CM

## 2018-12-09 DIAGNOSIS — E119 Type 2 diabetes mellitus without complications: Secondary | ICD-10-CM

## 2018-12-09 DIAGNOSIS — G4700 Insomnia, unspecified: Secondary | ICD-10-CM

## 2018-12-09 DIAGNOSIS — Z794 Long term (current) use of insulin: Secondary | ICD-10-CM

## 2018-12-09 DIAGNOSIS — Z86010 Personal history of colonic polyps: Secondary | ICD-10-CM

## 2018-12-09 DIAGNOSIS — R350 Frequency of micturition: Secondary | ICD-10-CM

## 2018-12-09 DIAGNOSIS — K219 Gastro-esophageal reflux disease without esophagitis: Secondary | ICD-10-CM

## 2018-12-09 DIAGNOSIS — R3915 Urgency of urination: Secondary | ICD-10-CM

## 2018-12-09 NOTE — Progress Notes (Signed)
[place] [phone]   [phone]  OFFICE PROGRESS NOTE  Koir[place], D[place], MD [facility] [address] [place] [address]  DIAGNOSIS: Stage IA (T1a, N0, M0) non-small cell lung cancer, adenocarcinoma presented with right upper lobe pulmonary nodule   PRIOR THERAPY: S/P right VATS with right upper lobectomy and mediastinal lymph node dissection under the care of Dr. Roxan Hockey on October 15, 2016.  CURRENT THERAPY: Observation.  INTERVAL HISTORY: [name] 63 y.o. female returns to the clinic today for 6 months follow-up visit.  The patient is feeling fine today with no concerning complaints.  She denied having any fatigue or weakness.  She donated 2 units of blood recently.  She denied having any chest pain, shortness of breath, cough or hemoptysis.  She denied having any weight loss or night sweats.  She has no nausea, vomiting, diarrhea or constipation.  She has no headache or visual changes.  The patient had repeat CT scan of the chest performed recently and she is here for evaluation and discussion of her scan results.   MEDICAL HISTORY: Past Medical History:  Diagnosis Date  . Adenocarcinoma of right lung, stage 1 ([place]) 11/29/2016  . Cancer ([place])   . Chronic pain   . Depression    takes Lexapro daily  . Diabetes mellitus    takes Metformin daily and has an insulin pump.Fasting blood sugar runs250  . GERD (gastroesophageal reflux disease)    takes Pantoprazole daily  . History of colon polyps    benign  . Hyperlipidemia    takes Atorvastatin daily  . Hypertension    takes Amlodipine and Lisinopril daily  . Insomnia    takes Trazodone nightly  . Nocturia   . Thyroid disease   . Urinary frequency   . Urinary urgency     ALLERGIES:  is allergic to synthroid [levothyroxine sodium]; iodine; and povidone-iodine.  MEDICATIONS:  Current Outpatient Medications  Medication Sig Dispense Refill  . ACCU-CHEK SOFTCLIX  LANCETS lancets Use to monitor glucose levels BID; E11.9 100 each 12  . acetaminophen (TYLENOL) 500 MG tablet Take 1 tablet (500 mg total) by mouth every 6 (six) hours as needed. 30 tablet 0  . amLODipine (NORVASC) 5 MG tablet Take 5 mg by mouth daily at 6 PM.     . atorvastatin (LIPITOR) 20 MG tablet Take 20 mg by mouth daily.  11  . Blood Glucose Calibration (GLUCOSE CONTROL) SOLN 1 Bottle by In Vitro route as needed. Use to calibrate Accu-Chek Aviva Plus device prn; please provide control solution compatible with Accu-Chek Aviva Plus device. 1 each 1  . Blood Glucose Monitoring Suppl (ACCU-CHEK AVIVA PLUS) w/Device KIT 1 each by Does not apply route 2 (two) times daily. Use to monitor glucose levels BID; E11.9 1 kit 0  . bromocriptine (PARLODEL) 2.5 MG tablet Take 1 tablet (2.5 mg total) by mouth daily. 15 tablet 11  . Calcium Carb-Cholecalciferol (CALCIUM 600+D3 PO) Take 1 tablet by mouth 2 (two) times a week.    . chlorthalidone (HYGROTON) 25 MG tablet Take 25 mg by mouth as directed. Takes 1/2 tablet daily    . Dulaglutide 1.5 MG/0.5ML SOPN INJECT 1.5 MG INTO THE SKIN ONCE A WEEK. 2 pen 35  . escitalopram (LEXAPRO) 10 MG tablet Take 1 tablet (10 mg total) by mouth every morning. 90 tablet 3  . FARXIGA 10 MG TABS tablet TAKE 1 TABLET BY MOUTH EVERY DAY 30 tablet 11  .  glucose blood (ACCU-CHEK AVIVA PLUS) test strip Use to monitor glucose levels BID; E11.9 100 each 12  . insulin aspart (NOVOLOG) 100 UNIT/ML injection For use in pump, total of 70 units per day 3 vial PRN  . Insulin Disposable Pump (V-GO 40) KIT 1 Device by Does not apply route daily. 90 kit 3  . lisinopril (PRINIVIL,ZESTRIL) 40 MG tablet Take 40 mg by mouth daily.    . metFORMIN (GLUCOPHAGE-XR) 500 MG 24 hr tablet TAKE 1 TABLET (500 MG TOTAL) BY MOUTH 2 (TWO) TIMES DAILY. 180 tablet 3  . Omega-3 Fatty Acids (FISH OIL) 600 MG CAPS Take 1,200 mg by mouth at bedtime.    . pantoprazole (PROTONIX) 40 MG tablet Take 40 mg by mouth  daily.    . traZODone (DESYREL) 100 MG tablet Take 2 tablets (200 mg total) by mouth at bedtime. 180 tablet 3  . YUVAFEM 10 MCG TABS vaginal tablet Place 10 mcg vaginally See admin instructions. Twice a week as needed for vaginal dryness  11   No current facility-administered medications for this visit.     SURGICAL HISTORY:  Past Surgical History:  Procedure Laterality Date  . ABDOMINAL HYSTERECTOMY    . ABDOMINAL SURGERY    . COLONOSCOPY  2005 MAC   TICs, IH  . COLONOSCOPY N/A 03/08/2014   Procedure: COLONOSCOPY;  Surgeon: Danie Binder, MD;  [facility];  Service: Endoscopy;  Laterality: N/A;  1015  . CYSTOSTOMY  11/22/2011   Procedure: CYSTOSTOMY SUPRAPUBIC;  Surgeon: Marissa Nestle, MD;  Location: [facility];  Service: Urology;  Laterality: N/A;  . ESOPHAGOGASTRODUODENOSCOPY N/A 03/08/2014   Procedure: ESOPHAGOGASTRODUODENOSCOPY (EGD);  Surgeon: Danie Binder, MD;  [facility];  Service: Endoscopy;  Laterality: N/A;  . ESOPHAGOGASTRODUODENOSCOPY    . HALLUX VALGUS CORRECTION     bilateral foot surgery for bone repairs-multiple  . LOBECTOMY Right 10/15/2016   Procedure: RIGHT UPPER LOBECTOMY;  Surgeon: [name], MD;  Location: [facility];  Service: Thoracic;  Laterality: Right;  . LYMPH NODE DISSECTION Right 10/15/2016   Procedure: LYMPH NODE DISSECTION;  Surgeon: [name], MD;  Location: [facility];  Service: Thoracic;  Laterality: Right;  Marland Kitchen VAGINA RECONSTRUCTION SURGERY     tvt 2006- [facility]  . VIDEO ASSISTED THORACOSCOPY (VATS)/WEDGE RESECTION Right 10/15/2016   Procedure: RIGHT VIDEO ASSISTED THORACOSCOPY (VATS)/WEDGE RESECTION;  Surgeon: [name], MD;  Location: [facility];  Service: Thoracic;  Laterality: Right;    REVIEW OF SYSTEMS:  A comprehensive review of systems was negative.   PHYSICAL EXAMINATION: General appearance: alert, cooperative and no distress Head: Normocephalic, without obvious abnormality, atraumatic  Neck: no adenopathy, no JVD, supple, symmetrical, trachea midline and thyroid not enlarged, symmetric, no tenderness/mass/nodules Lymph nodes: Cervical, supraclavicular, and axillary nodes normal. Resp: clear to auscultation bilaterally Back: symmetric, no curvature. ROM normal. No CVA tenderness. Cardio: regular rate and rhythm, S1, S2 normal, no murmur, click, rub or gallop GI: soft, non-tender; bowel sounds normal; no masses,  no organomegaly Extremities: extremities normal, atraumatic, no cyanosis or edema  ECOG PERFORMANCE STATUS: 1 - Symptomatic but completely ambulatory  Blood pressure 123/60, pulse 72, temperature 97.9 F (36.6 C), temperature source Oral, resp. rate 17, height _0  (1.676 m), weight 262 lb (118.8 kg), SpO2 100 %.  LABORATORY DATA: Lab Results  Component Value Date   WBC 9.0 12/08/2018   HGB 10.3 (L) 12/08/2018   HCT 35.3 (L) 12/08/2018   MCV 73.2 (L) 12/08/2018  PLT 286 12/08/2018      Chemistry      Component Value Date/Time   NA 140 12/08/2018 1155   NA 141 05/29/2017 0921   K 4.2 12/08/2018 1155   K 4.8 05/29/2017 0921   CL 105 12/08/2018 1155   CO2 26 12/08/2018 1155   CO2 28 05/29/2017 0921   BUN 17 12/08/2018 1155   BUN 13.5 05/29/2017 0921   CREATININE 0.83 12/08/2018 1155   CREATININE 0.8 05/29/2017 0921      Component Value Date/Time   CALCIUM 9.0 12/08/2018 1155   CALCIUM 9.5 05/29/2017 0921   ALKPHOS 96 12/08/2018 1155   ALKPHOS 104 05/29/2017 0921   AST 14 (L) 12/08/2018 1155   AST 17 05/29/2017 0921   ALT 18 12/08/2018 1155   ALT 20 05/29/2017 0921   BILITOT 0.4 12/08/2018 1155   BILITOT 0.40 05/29/2017 0921       RADIOGRAPHIC STUDIES: Ct Chest W Contrast  Result Date: 12/08/2018 CLINICAL DATA:  Right lung cancer EXAM: CT CHEST WITH CONTRAST TECHNIQUE: Multidetector CT imaging of the chest was performed during intravenous contrast administration. CONTRAST:  22m OMNIPAQUE IOHEXOL 300 MG/ML  SOLN COMPARISON:  06/06/2018  FINDINGS: Cardiovascular: Heart is normal in size.  No pericardial effusion. No evidence of thoracic aortic aneurysm. Atherosclerotic calcifications of the aortic arch. Mild coronary atherosclerosis the LAD. Mediastinum/Nodes: 8 mm short axis subcarinal node, within normal limits. No suspicious axillary lymphadenopathy. Visualized thyroid is unremarkable. Lungs/Pleura: Status post right upper lobectomy. No suspicious pulmonary nodules. Stable 3 mm perifissural nodule in the posterior left upper lobe (series 7/image 44), unchanged, benign. Prior subpleural nodule in the superior segment left lower lobe is no longer evident. No focal consolidation. No pleural effusion or pneumothorax. Upper Abdomen: Visualized upper abdomen is notable for stable bilateral adrenal nodules, measuring 1.6 cm on the right and 1.8 cm on the left, unchanged. Musculoskeletal: Mild degenerative changes of the visualized thoracolumbar spine. IMPRESSION: Status post right upper lobectomy. No evidence of recurrent or metastatic disease. Aortic Atherosclerosis (ICD10-I70.0). Electronically Signed   By: SJulian HyM.D.   On: 12/08/2018 16:55    ASSESSMENT AND PLAN: This is a very pleasant 63years old white female with stage Ia non-small cell lung cancer status post right upper lobectomy with lymph node dissection.  This was diagnosed in March 2018. The patient is feeling fine today with no concerning complaints. She had repeat CT scan of the chest performed recently.  I personally and independently reviewed the scans and discussed the results with the patient today. Her scan showed no concerning findings for disease recurrence or progression. I recommended for her to continue on observation with repeat CT scan of the chest in 1 year. For the anemia, this is likely secondary to the recent blood donation.  She was advised to take oral iron tablets if needed. The patient was advised to call immediately if she has any concerning  symptoms in the interval. The patient voices understanding of current disease status and treatment options and is in agreement with the current care plan. All questions were answered. The patient knows to call the clinic with any problems, questions or concerns. We can certainly see the patient much sooner if necessary.  I spent 10 minutes counseling the patient face to face. The total time spent in the appointment was 15 minutes.  Disclaimer: This note was dictated with voice recognition software. Similar sounding words can inadvertently be transcribed and may not be corrected upon review.

## 2018-12-10 ENCOUNTER — Telehealth: Payer: Self-pay | Admitting: Internal Medicine

## 2018-12-10 NOTE — Telephone Encounter (Signed)
Scheduled appt per 5/12 los - letter mailed with appt date and time / Follow up and scan in one year.

## 2018-12-23 ENCOUNTER — Encounter (HOSPITAL_COMMUNITY): Payer: Self-pay | Admitting: Psychiatry

## 2018-12-23 ENCOUNTER — Other Ambulatory Visit: Payer: Self-pay

## 2018-12-23 ENCOUNTER — Ambulatory Visit (INDEPENDENT_AMBULATORY_CARE_PROVIDER_SITE_OTHER): Payer: 59 | Admitting: Psychiatry

## 2018-12-23 DIAGNOSIS — F331 Major depressive disorder, recurrent, moderate: Secondary | ICD-10-CM | POA: Diagnosis not present

## 2018-12-23 MED ORDER — TRAZODONE HCL 100 MG PO TABS: 200.0000 mg | ORAL_TABLET | Freq: Every day | ORAL | 3 refills | Status: DC

## 2018-12-23 MED ORDER — ESCITALOPRAM OXALATE 10 MG PO TABS: 10.0000 mg | ORAL_TABLET | Freq: Every morning | ORAL | 3 refills | Status: DC
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
## 2019-01-23 ENCOUNTER — Other Ambulatory Visit: Payer: Self-pay

## 2019-01-27 ENCOUNTER — Encounter: Payer: Self-pay | Admitting: Endocrinology

## 2019-01-27 ENCOUNTER — Ambulatory Visit: Payer: 59 | Admitting: Endocrinology

## 2019-01-27 ENCOUNTER — Other Ambulatory Visit: Payer: Self-pay

## 2019-01-27 VITALS — BP 118/70 | HR 98 | Ht 66.0 in | Wt 260.4 lb

## 2019-01-27 DIAGNOSIS — E119 Type 2 diabetes mellitus without complications: Secondary | ICD-10-CM | POA: Diagnosis not present

## 2019-01-27 DIAGNOSIS — Z794 Long term (current) use of insulin: Secondary | ICD-10-CM | POA: Diagnosis not present

## 2019-01-27 LAB — POCT GLYCOSYLATED HEMOGLOBIN (HGB A1C): Hemoglobin A1C: 7.2 % — AB (ref 4.0–5.6)

## 2019-01-27 MED ORDER — BROMOCRIPTINE MESYLATE 2.5 MG PO TABS: 2.5000 mg | ORAL_TABLET | Freq: Every day | ORAL | 11 refills | Status: DC

## 2019-01-27 NOTE — Patient Instructions (Addendum)
I have sent a prescription to your pharmacy, to increase the bromocriptine to 1 pill per day. please continue the same other diabetes medications.  check your blood sugar twice a day.  vary the time of day when you check, between before the 3 meals, and at bedtime.  also check if you have symptoms of your blood sugar being too high or too low.  please keep a record of the readings and bring it to your next appointment here (or you can bring the meter itself).  You can write it on any piece of paper.  please call us sooner if your blood sugar goes below 70, or if you have a lot of readings over 200.   Please come back for a follow-up appointment in 3-4 months.

## 2019-01-27 NOTE — Progress Notes (Signed)
Subjective:    Patient ID: [name], female    DOB: 11/07/1955, 63 y.o.   MRN: [id number]  HPI Pt returns for f/u of diabetes mellitus: DM type: Insulin-requiring type 2.   Dx'ed: 0301 Complications: none Therapy: insulin since soon after dx, metformin, farxiga, bromocriptine, and trulicity.     GDM: never (G0) DKA: never Severe hypoglycemia: never.  Pancreatitis: never.  Other: she stopped victoza, due to lack of effect; she uses V-GO-40; metformin dosage is limited by nausea.   Interval history: no recent steroids.  she brings her meter with her cbg's which I have reviewed today. cbg's vary from 101-198.   It is in general higher as the day goes on, but not necessarily so.  She takes a total of approx 18 clicks per day, via the V-GO-40.  She takes parlodel, just 1/2 pill per day Past Medical History:  Diagnosis Date  . Adenocarcinoma of right lung, stage 1 ([place]) 11/29/2016  . Cancer ([place])   . Chronic pain   . Depression    takes Lexapro daily  . Diabetes mellitus    takes Metformin daily and has an insulin pump.Fasting blood sugar runs250  . GERD (gastroesophageal reflux disease)    takes Pantoprazole daily  . History of colon polyps    benign  . Hyperlipidemia    takes Atorvastatin daily  . Hypertension    takes Amlodipine and Lisinopril daily  . Insomnia    takes Trazodone nightly  . Nocturia   . Thyroid disease   . Urinary frequency   . Urinary urgency     Past Surgical History:  Procedure Laterality Date  . ABDOMINAL HYSTERECTOMY    . ABDOMINAL SURGERY    . COLONOSCOPY  2005 MAC   TICs, IH  . COLONOSCOPY N/A 03/08/2014   Procedure: COLONOSCOPY;  Surgeon: Danie Binder, MD;  [facility];  Service: Endoscopy;  Laterality: N/A;  1015  . CYSTOSTOMY  11/22/2011   Procedure: CYSTOSTOMY SUPRAPUBIC;  Surgeon: Marissa Nestle, MD;  Location: [facility];  Service: Urology;  Laterality: N/A;  . ESOPHAGOGASTRODUODENOSCOPY N/A 03/08/2014   Procedure:  ESOPHAGOGASTRODUODENOSCOPY (EGD);  Surgeon: Danie Binder, MD;  [facility];  Service: Endoscopy;  Laterality: N/A;  . ESOPHAGOGASTRODUODENOSCOPY    . HALLUX VALGUS CORRECTION     bilateral foot surgery for bone repairs-multiple  . LOBECTOMY Right 10/15/2016   Procedure: RIGHT UPPER LOBECTOMY;  Surgeon: [name], MD;  Location: [facility];  Service: Thoracic;  Laterality: Right;  . LYMPH NODE DISSECTION Right 10/15/2016   Procedure: LYMPH NODE DISSECTION;  Surgeon: [name], MD;  Location: [facility];  Service: Thoracic;  Laterality: Right;  Marland Kitchen VAGINA RECONSTRUCTION SURGERY     tvt 2006- [place]  . VIDEO ASSISTED THORACOSCOPY (VATS)/WEDGE RESECTION Right 10/15/2016   Procedure: RIGHT VIDEO ASSISTED THORACOSCOPY (VATS)/WEDGE RESECTION;  Surgeon: [name], MD;  Location: [facility];  Service: Thoracic;  Laterality: Right;    Social History   Socioeconomic History  . Marital status: Single    Spouse name: Not on file  . Number of children: Not on file  . Years of education: Not on file  . Highest education level: Not on file  Occupational History  . Not on file  Social Needs  . Financial resource strain: Not on file  . Food insecurity    Worry: Not on file    Inability: Not on file  . Transportation needs  Medical: Not on file    Non-medical: Not on file  Tobacco Use  . Smoking status: Former Smoker    Packs/day: 1.00    Years: 32.00    Pack years: 32.00    Types: Cigarettes  . Smokeless tobacco: Never Used  . Tobacco comment: quit smoking 15 yrs ago  Substance and Sexual Activity  . Alcohol use: No    Comment: rarely  . Drug use: No  . Sexual activity: Not Currently    Birth control/protection: Surgical  Lifestyle  . Physical activity    Days per week: Not on file    Minutes per session: Not on file  . Stress: Not on file  Relationships  . Social Herbalist on phone: Not on file    Gets together: Not on file     Attends religious service: Not on file    Active member of club or organization: Not on file    Attends meetings of clubs or organizations: Not on file    Relationship status: Not on file  . Intimate partner violence    Fear of current or ex partner: Not on file    Emotionally abused: Not on file    Physically abused: Not on file    Forced sexual activity: Not on file  Other Topics Concern  . Not on file  Social History Narrative  . Not on file    Current Outpatient Medications on File Prior to Visit  Medication Sig Dispense Refill  . ACCU-CHEK SOFTCLIX LANCETS lancets Use to monitor glucose levels BID; E11.9 100 each 12  . acetaminophen (TYLENOL) 500 MG tablet Take 1 tablet (500 mg total) by mouth every 6 (six) hours as needed. 30 tablet 0  . amLODipine (NORVASC) 5 MG tablet Take 5 mg by mouth daily at 6 PM.     . atorvastatin (LIPITOR) 20 MG tablet Take 20 mg by mouth daily.  11  . Blood Glucose Calibration (GLUCOSE CONTROL) SOLN 1 Bottle by In Vitro route as needed. Use to calibrate Accu-Chek Aviva Plus device prn; please provide control solution compatible with Accu-Chek Aviva Plus device. 1 each 1  . Blood Glucose Monitoring Suppl (ACCU-CHEK AVIVA PLUS) w/Device KIT 1 each by Does not apply route 2 (two) times daily. Use to monitor glucose levels BID; E11.9 1 kit 0  . Calcium Carb-Cholecalciferol (CALCIUM 600+D3 PO) Take 1 tablet by mouth 2 (two) times a week.    . chlorthalidone (HYGROTON) 25 MG tablet Take 25 mg by mouth as directed. Takes 1/2 tablet daily    . Dulaglutide 1.5 MG/0.5ML SOPN INJECT 1.5 MG INTO THE SKIN ONCE A WEEK. 2 pen 35  . escitalopram (LEXAPRO) 10 MG tablet Take 1 tablet (10 mg total) by mouth every morning. 90 tablet 3  . FARXIGA 10 MG TABS tablet TAKE 1 TABLET BY MOUTH EVERY DAY 30 tablet 11  . glucose blood (ACCU-CHEK AVIVA PLUS) test strip Use to monitor glucose levels BID; E11.9 100 each 12  . insulin aspart (NOVOLOG) 100 UNIT/ML injection For use in  pump, total of 70 units per day 3 vial PRN  . Insulin Disposable Pump (V-GO 40) KIT 1 Device by Does not apply route daily. 90 kit 3  . lisinopril (PRINIVIL,ZESTRIL) 40 MG tablet Take 40 mg by mouth daily.    . metFORMIN (GLUCOPHAGE-XR) 500 MG 24 hr tablet TAKE 1 TABLET (500 MG TOTAL) BY MOUTH 2 (TWO) TIMES DAILY. 180 tablet 3  . Omega-3 Fatty Acids (  FISH OIL) 600 MG CAPS Take 1,200 mg by mouth at bedtime.    . pantoprazole (PROTONIX) 40 MG tablet Take 40 mg by mouth daily.    . traZODone (DESYREL) 100 MG tablet Take 2 tablets (200 mg total) by mouth at bedtime. 180 tablet 3  . YUVAFEM 10 MCG TABS vaginal tablet Place 10 mcg vaginally See admin instructions. Twice a week as needed for vaginal dryness  11   No current facility-administered medications on file prior to visit.     Allergies  Allergen Reactions  . Synthroid [Levothyroxine Sodium] Anaphylaxis  . Iodine Hives  . Povidone-Iodine Itching and Rash    Family History  Problem Relation Age of Onset  . Asthma Unknown   . Diabetes Unknown   . Arthritis Unknown   . Cancer Mother   . Cancer Brother   . Anesthesia problems Neg Hx   . Hypotension Neg Hx   . Malignant hyperthermia Neg Hx   . Pseudochol deficiency Neg Hx   . Colon polyps Neg Hx   . Colon cancer Neg Hx   . Celiac disease Neg Hx   . Pancreatic cancer Neg Hx   . Stomach cancer Neg Hx   . Ulcerative colitis Neg Hx   . Crohn's disease Neg Hx     BP 118/70 (BP Location: Left Arm, Patient Position: Sitting, Cuff Size: Large)   Pulse 98   Ht _0  (1.676 m)   Wt 260 lb 6.4 oz (118.1 kg)   SpO2 94%   BMI 42.03 kg/m    Review of Systems She denies hypoglycemia.  Nausea is mild.      Objective:   Physical Exam VITAL SIGNS:  See vs page GENERAL: no distress Pulses: dorsalis pedis intact bilat.   MSK: no deformity of the feet CV: trace bilat leg edema.   Skin:  no ulcer on the feet.  normal color and temp on the feet.  Neuro: sensation is intact to touch on  the feet.    Lab Results  Component Value Date   HGBA1C 7.2 (A) 01/27/2019       Assessment & Plan:  Type 2 DM: she needs increased rx.  Edema: This limits rx options Nausea: this limits rx dosages  Patient Instructions  I have sent a prescription to your pharmacy, to increase the bromocriptine to 1 pill per day. please continue the same other diabetes medications.  check your blood sugar twice a day.  vary the time of day when you check, between before the 3 meals, and at bedtime.  also check if you have symptoms of your blood sugar being too high or too low.  please keep a record of the readings and bring it to your next appointment here (or you can bring the meter itself).  You can write it on any piece of paper.  please call us sooner if your blood sugar goes below 70, or if you have a lot of readings over 200.   Please come back for a follow-up appointment in 3-4 months.

## 2019-02-17 ENCOUNTER — Telehealth: Payer: Self-pay | Admitting: Endocrinology

## 2019-02-17 NOTE — Telephone Encounter (Addendum)
Left message with Gerald Stabs to Find out next step on what to do for this.

## 2019-02-17 NOTE — Telephone Encounter (Signed)
Its about the vgo 40 approval and now she has medicare and it doesn't cover it. Please assist. I did put the new insurance in the system

## 2019-02-17 NOTE — Telephone Encounter (Signed)
Are you able to help this pt about her insurance issues and VGo?

## 2019-02-18 NOTE — Telephone Encounter (Signed)
Per the drug rep. Request, a new insurance form was sent to V-go to determine if her insurances will pay and they will contact the patient with this information

## 2019-02-23 ENCOUNTER — Other Ambulatory Visit: Payer: Self-pay | Admitting: Family Medicine

## 2019-02-23 DIAGNOSIS — N644 Mastodynia: Secondary | ICD-10-CM

## 2019-02-27 ENCOUNTER — Ambulatory Visit
Admission: RE | Admit: 2019-02-27 | Discharge: 2019-02-27 | Disposition: A | Discharge status: Home / Self Care | Payer: 59 | Source: Ambulatory Visit | Attending: Family Medicine | Admitting: Family Medicine

## 2019-02-27 ENCOUNTER — Other Ambulatory Visit: Payer: Self-pay

## 2019-02-27 ENCOUNTER — Encounter: Payer: Self-pay | Admitting: Gastroenterology

## 2019-02-27 DIAGNOSIS — N644 Mastodynia: Secondary | ICD-10-CM

## 2019-03-02 ENCOUNTER — Telehealth: Payer: Self-pay | Admitting: Nutrition

## 2019-03-02 NOTE — Telephone Encounter (Signed)
Patient reported that she had no heard back from [place].  Gerald Stabs called again and left message that I need a special authorization form from [place].

## 2019-03-02 NOTE — Telephone Encounter (Signed)
9

## 2019-03-02 NOTE — Telephone Encounter (Signed)
Paperwork filled out for [employer] determination and put on dr. Cordelia Pen desk

## 2019-03-03 NOTE — Telephone Encounter (Signed)
Patient says she has supplies for the next 3 weeks.  Paperwork put on Dr. Quin Hoop desk for a signature

## 2019-03-06 ENCOUNTER — Telehealth: Payer: Self-pay

## 2019-03-06 NOTE — Telephone Encounter (Signed)
It appears Leonia Reader, RN sent in [place] for V-Go 40 to [place]. This has been denied under pt pharmacy benefits but may be covered under MEDICAL benefits. Letter labeled and placed on her desk for to review and address.

## 2019-03-09 ENCOUNTER — Encounter: Payer: Self-pay | Admitting: Gastroenterology

## 2019-03-09 ENCOUNTER — Other Ambulatory Visit: Payer: Self-pay

## 2019-03-09 ENCOUNTER — Ambulatory Visit (INDEPENDENT_AMBULATORY_CARE_PROVIDER_SITE_OTHER): Payer: Medicare Other | Admitting: Nurse Practitioner

## 2019-03-09 ENCOUNTER — Encounter: Payer: Self-pay | Admitting: Nurse Practitioner

## 2019-03-09 DIAGNOSIS — R1319 Other dysphagia: Secondary | ICD-10-CM

## 2019-03-09 DIAGNOSIS — D509 Iron deficiency anemia, unspecified: Secondary | ICD-10-CM | POA: Diagnosis not present

## 2019-03-09 DIAGNOSIS — D649 Anemia, unspecified: Secondary | ICD-10-CM | POA: Insufficient documentation

## 2019-03-09 DIAGNOSIS — Z86010 Personal history of colonic polyps: Secondary | ICD-10-CM | POA: Diagnosis not present

## 2019-03-09 DIAGNOSIS — R1310 Dysphagia, unspecified: Secondary | ICD-10-CM | POA: Diagnosis not present

## 2019-03-09 NOTE — Progress Notes (Addendum)
REVIEWED-NO ADDITIONAL RECOMMENDATIONS.  Referring Provider: Lujean Amel, MD Primary Care Physician:  Lujean Amel, MD Primary GI:  Dr. Oneida Alar  Chief Complaint  Patient presents with   Colonoscopy    change in bowel habits/ anemic now also    HPI:   [name] is a 63 y.o. female who presents for 5-year repeat colonoscopy.  Patient was last seen in our office 09/13/2017 for change in stool habits.  At that time noted recent emergency department visit due to lower abdominal pain and severe diarrhea for which she was given Lomotil which helps.  Requesting a refill at that time.  Stopped metformin and at that time was having [place] 6 stools that feels like it was getting back to [place] 5.  Diarrhea felt most likely due to increasing metformin with improvement noted after stopping metformin and reducing lactose.  Follow-up with endocrinology.  Lactose-free diet.  Lomotil as needed.  Last colonoscopy 03/08/2014 which found normal terminal ileum, 6 colon polyps, polypoid lesion in the ascending and descending colon most likely inverted diverticula, small internal hemorrhoids.  Surgical pathology found the polyps to be a mix of sessile serrated polyp and hyperplastic polyps. Recommended 5-year repeat exam.  Today she states she's doing ok overall. She has recent anemia, no recheck yet. States Ferritin was 4. States hgb got as low as 9. Sees heme/onc at Denies abdominal pain, vomiting, fever, chills, unintentional weight loss. She has had a "weird fullness" sensation as of the past few months; also sensation of nausea and regurgitating. Has also noted some intermittnet solid food dysphagia and "choking" on food. No pill dysphagia. Denies hematochezia, melena, fever, chills, unintentional weight loss. Denies URI or flu-like symptoms. Denies loss of sense of taste or smell. Denies chest pain, dyspnea, dizziness, lightheadedness, syncope, near syncope. Denies any other upper or lower GI  symptoms.  Denies any breakthrough GERD symptoms on Protonix.  Past Medical History:  Diagnosis Date   Adenocarcinoma of right lung, stage 1 ([place]) 11/29/2016   Cancer ([facility])    Chronic pain    Depression    takes Lexapro daily   Diabetes mellitus    takes Metformin daily and has an insulin pump.Fasting blood sugar runs250   GERD (gastroesophageal reflux disease)    takes Pantoprazole daily   History of colon polyps    benign   Hyperlipidemia    takes Atorvastatin daily   Hypertension    takes Amlodipine and Lisinopril daily   Insomnia    takes Trazodone nightly   Nocturia    Thyroid disease    Urinary frequency    Urinary urgency     Past Surgical History:  Procedure Laterality Date   ABDOMINAL HYSTERECTOMY     ABDOMINAL SURGERY     COLONOSCOPY  2005 MAC   TICs, IH   COLONOSCOPY N/A 03/08/2014   Procedure: COLONOSCOPY;  Surgeon: Danie Binder, MD;  [facility];  Service: Endoscopy;  Laterality: N/A;  1015   CYSTOSTOMY  11/22/2011   Procedure: CYSTOSTOMY SUPRAPUBIC;  Surgeon: Marissa Nestle, MD;  [facility];  Service: Urology;  Laterality: N/A;   ESOPHAGOGASTRODUODENOSCOPY N/A 03/08/2014   Procedure: ESOPHAGOGASTRODUODENOSCOPY (EGD);  Surgeon: Danie Binder, MD;  [facility];  Service: Endoscopy;  Laterality: N/A;   ESOPHAGOGASTRODUODENOSCOPY     HALLUX VALGUS CORRECTION     bilateral foot surgery for bone repairs-multiple   LOBECTOMY Right 10/15/2016   Procedure: RIGHT UPPER LOBECTOMY;  Surgeon: [name], MD;  Location: [facility];  Service: Thoracic;  Laterality: Right;   LYMPH NODE DISSECTION Right 10/15/2016   Procedure: LYMPH NODE DISSECTION;  Surgeon: [name], MD;  Location: [facility];  Service: Thoracic;  Laterality: Right;   VAGINA RECONSTRUCTION SURGERY     tvt 2006- [place] (VATS)/WEDGE RESECTION Right 10/15/2016   Procedure: RIGHT VIDEO ASSISTED  THORACOSCOPY (VATS)/WEDGE RESECTION;  Surgeon: [name], MD;  Location: [facility];  Service: Thoracic;  Laterality: Right;    Current Outpatient Medications  Medication Sig Dispense Refill   acetaminophen (TYLENOL) 500 MG tablet Take 1 tablet (500 mg total) by mouth every 6 (six) hours as needed. 30 tablet 0   amLODipine (NORVASC) 5 MG tablet Take 5 mg by mouth daily at 6 PM.      atorvastatin (LIPITOR) 20 MG tablet Take 20 mg by mouth daily.  11   bromocriptine (PARLODEL) 2.5 MG tablet Take 1 tablet (2.5 mg total) by mouth daily. 30 tablet 11   chlorthalidone (HYGROTON) 25 MG tablet Take 25 mg by mouth as directed. Takes 1/2 tablet daily     Dulaglutide 1.5 MG/0.5ML SOPN INJECT 1.5 MG INTO THE SKIN ONCE A WEEK. 2 pen 35   escitalopram (LEXAPRO) 10 MG tablet Take 1 tablet (10 mg total) by mouth every morning. 90 tablet 3   FARXIGA 10 MG TABS tablet TAKE 1 TABLET BY MOUTH EVERY DAY 30 tablet 11   insulin aspart (NOVOLOG) 100 UNIT/ML injection For use in pump, total of 70 units per day 3 vial PRN   lisinopril (PRINIVIL,ZESTRIL) 40 MG tablet Take 40 mg by mouth daily.     metFORMIN (GLUCOPHAGE-XR) 500 MG 24 hr tablet TAKE 1 TABLET (500 MG TOTAL) BY MOUTH 2 (TWO) TIMES DAILY. (Patient taking differently: Take 500 mg by mouth daily with breakfast. ) 180 tablet 3   pantoprazole (PROTONIX) 40 MG tablet Take 40 mg by mouth daily.     traZODone (DESYREL) 100 MG tablet Take 2 tablets (200 mg total) by mouth at bedtime. 180 tablet 3   YUVAFEM 10 MCG TABS vaginal tablet Place 10 mcg vaginally See admin instructions. Twice a week as needed for vaginal dryness  11   ACCU-CHEK SOFTCLIX LANCETS lancets Use to monitor glucose levels BID; E11.9 100 each 12   Blood Glucose Calibration (GLUCOSE CONTROL) SOLN 1 Bottle by In Vitro route as needed. Use to calibrate Accu-Chek Aviva Plus device prn; please provide control solution compatible with Accu-Chek Aviva Plus device. 1 each 1   Blood  Glucose Monitoring Suppl (ACCU-CHEK AVIVA PLUS) w/Device KIT 1 each by Does not apply route 2 (two) times daily. Use to monitor glucose levels BID; E11.9 1 kit 0   glucose blood (ACCU-CHEK AVIVA PLUS) test strip Use to monitor glucose levels BID; E11.9 100 each 12   Insulin Disposable Pump (V-GO 40) KIT 1 Device by Does not apply route daily. 90 kit 3   Omega-3 Fatty Acids (FISH OIL) 600 MG CAPS Take 1,200 mg by mouth at bedtime.     No current facility-administered medications for this visit.     Allergies as of 03/09/2019 - Review Complete 03/09/2019  Allergen Reaction Noted   Synthroid [levothyroxine sodium] Anaphylaxis 11/29/2011   Iodine Hives 11/12/2011   Povidone-iodine Itching and Rash 10/20/2014    Family History  Problem Relation Age of Onset   Asthma Other    Diabetes Other    Arthritis Other    Cancer Mother  Cancer Brother    Anesthesia problems Neg Hx    Hypotension Neg Hx    Malignant hyperthermia Neg Hx    Pseudochol deficiency Neg Hx    Colon polyps Neg Hx    Colon cancer Neg Hx    Celiac disease Neg Hx    Pancreatic cancer Neg Hx    Stomach cancer Neg Hx    Ulcerative colitis Neg Hx    Crohn's disease Neg Hx     Social History   Socioeconomic History   Marital status: Single    Spouse name: Not on file   Number of children: Not on file   Years of education: Not on file   Highest education level: Not on file  Occupational History   Not on file  Social Needs   Financial resource strain: Not on file   Food insecurity    Worry: Not on file    Inability: Not on file   Transportation needs    Medical: Not on file    Non-medical: Not on file  Tobacco Use   Smoking status: Former Smoker    Packs/day: 1.00    Years: 32.00    Pack years: 32.00    Types: Cigarettes   Smokeless tobacco: Never Used   Tobacco comment: quit smoking 15 yrs ago  Substance and Sexual Activity   Alcohol use: No    Comment: rarely    Drug use: No   Sexual activity: Not Currently    Birth control/protection: Surgical  Lifestyle   Physical activity    Days per week: Not on file    Minutes per session: Not on file   Stress: Not on file  Relationships   Social connections    Talks on phone: Not on file    Gets together: Not on file    Attends religious service: Not on file    Active member of club or organization: Not on file    Attends meetings of clubs or organizations: Not on file    Relationship status: Not on file  Other Topics Concern   Not on file  Social History Narrative   Not on file    Review of Systems: Complete ROS negative except as per HPI.   Physical Exam: BP 117/66    Pulse 79    Temp (!) 97.3 F (36.3 C) (Temporal)    Ht 5' 6.5" (1.689 m)    Wt 260 lb 3.2 oz (118 kg)    BMI 41.37 kg/m  General:   Alert and oriented. Pleasant and cooperative. Well-nourished and well-developed.  Eyes:  Without icterus, sclera clear and conjunctiva pink.  Ears:  Normal auditory acuity. Cardiovascular:  S1, S2 present without murmurs appreciated. Extremities without clubbing or edema. Respiratory:  Clear to auscultation bilaterally. No wheezes, rales, or rhonchi. No distress.  Gastrointestinal:  +BS, soft, non-tender and non-distended. No HSM noted. No guarding or rebound. No masses appreciated.  Rectal:  Deferred  Musculoskalatal:  Symmetrical without gross deformities. Neurologic:  Alert and oriented x4;  grossly normal neurologically. Psych:  Alert and cooperative. Normal mood and affect. Heme/Lymph/Immune: No excessive bruising noted.    03/09/2019 9:19 AM   Disclaimer: This note was dictated with voice recognition software. Similar sounding words can inadvertently be transcribed and may not be corrected upon review.

## 2019-03-09 NOTE — Assessment & Plan Note (Addendum)
Based on the patient's description of her labs it seems she has iron deficiency anemia.  She states her ferritin was 4.  Her hemoglobin got as low as 9 but at last check on 12/08/2018 by hematology her hemoglobin was 10.3.  Previous normal hemoglobin.  At this point she is due for colonoscopy as per above.  Because of her significant anemia we will add an upper endoscopy to further evaluate.  Follow-up in 3 months with our office.  Follow-up with primary care and hematology based on their recommendations for anemia management.  Proceed with EGD +/- dilation on propofol/MAC with Dr. Oneida Alar in near future: the risks, benefits, and alternatives have been discussed with the patient in detail. The patient states understanding and desires to proceed.  The patient is currently on Lexapro, metformin, trazodone.  No iron supplementation at this time.  We will plan for the procedure on propofol/MAC to promote adequate sedation.

## 2019-03-09 NOTE — Assessment & Plan Note (Signed)
Patient notes new solid food dysphagia which is intermittent.  No pill dysphagia.  Some new upper GI symptoms as per HPI.  She is going to have an upper endoscopy as well as colonoscopy due to history of colon polyps and new onset anemia.  We will schedule the upper endoscopy with possible dilation due to dysphagia.  Follow-up in our office in 3 months.

## 2019-03-09 NOTE — Patient Instructions (Signed)
Your health issues we discussed today were:   Need for colonoscopy: 1. We will schedule your colonoscopy for you 2. Further recommendations will be made after your colonoscopy  New anemia as well as swallowing difficulties: 1. Because of the newly found anemia we will add an upper endoscopy onto your procedure. 2. If they note a narrowing causing swallowing difficulties you describe they can pass a dilator during the upper endoscopy to help open it up and improve your swallowing function 3. Further recommendations will be made after your endoscopy with possible dilation  Overall I recommend:  1. Continue your other current medications 2. Follow-up with primary care and hematology based on her recommendations 3. Call us if you have any questions or concerns 4. Follow-up in 3 months.   Because of recent events of COVID-19 ("Coronavirus"), follow CDC recommendations:  Wash your hand frequently Avoid touching your face Stay away from people who are sick If you have symptoms such as fever, cough, shortness of breath then call your healthcare provider for further guidance If you are sick, STAY AT HOME unless otherwise directed by your healthcare provider. Follow directions from state and national officials regarding staying safe   At [facility] we value your feedback. You may receive a survey about your visit today. Please share your experience as we strive to create trusting relationships with our patients to provide genuine, compassionate, quality care.  We appreciate your understanding and patience as we review any laboratory studies, imaging, and other diagnostic tests that are ordered as we care for you. Our office policy is 5 business days for review of these results, and any emergent or urgent results are addressed in a timely manner for your best interest. If you do not hear from our office in 1 week, please contact us.   We also encourage the use of MyChart, which  contains your medical information for your review as well. If you are not enrolled in this feature, an access code is on this after visit summary for your convenience. Thank you for allowing [place] to be involved in your care.  It was great to see you today!  I hope you have a great summer!!

## 2019-03-09 NOTE — Assessment & Plan Note (Addendum)
The patient has a history of colon polyps her last colonoscopy in 2015 which found 6 total polyps.  Recommended 5-year repeat exam and she is currently due.  No overt GI symptoms.  There has been a finding of anemia and low ferritin by primary care.  Given this we will add an upper endoscopy as well.  Return for follow-up in 3 months.  Continue to follow with primary care and hematology for anemia management.  Proceed with colonoscopy on propofol/MAC with Dr. Oneida Alar in the near future. The risks, benefits, and alternatives have been discussed in detail with the patient. They state understanding and desire to proceed.   The patient is currently on Lexapro, metformin, trazodone.  No iron supplementation at this time.  We will plan for the procedure on propofol/MAC to promote adequate sedation.

## 2019-03-10 ENCOUNTER — Other Ambulatory Visit: Payer: Self-pay

## 2019-03-10 ENCOUNTER — Telehealth: Payer: Self-pay

## 2019-03-10 DIAGNOSIS — R1319 Other dysphagia: Secondary | ICD-10-CM

## 2019-03-10 DIAGNOSIS — R1310 Dysphagia, unspecified: Secondary | ICD-10-CM

## 2019-03-10 DIAGNOSIS — Z86010 Personal history of colonic polyps: Secondary | ICD-10-CM

## 2019-03-10 DIAGNOSIS — D509 Iron deficiency anemia, unspecified: Secondary | ICD-10-CM

## 2019-03-10 MED ORDER — PEG 3350-KCL-NA BICARB-NACL 420 G PO SOLR: 4000.0000 mL | ORAL | 0 refills | Status: DC

## 2019-03-10 NOTE — Telephone Encounter (Addendum)
Called pt, TCS/EGD w/Propofol w/SLF scheduled for 05/28/19 at 9:15am. Rx for prep sent to pharmacy. Orders entered.  Eric, please adjust diabetic medications prior to procedure. Does she need dilation for dysphagia? TCS/EGD w/Propofol were ordered on encounter form. Per OV note, possible dilation is needed.

## 2019-03-11 ENCOUNTER — Telehealth: Payer: Self-pay | Admitting: Gastroenterology

## 2019-03-11 ENCOUNTER — Other Ambulatory Visit: Payer: Self-pay

## 2019-03-11 DIAGNOSIS — R1310 Dysphagia, unspecified: Secondary | ICD-10-CM

## 2019-03-11 DIAGNOSIS — D509 Iron deficiency anemia, unspecified: Secondary | ICD-10-CM

## 2019-03-11 DIAGNOSIS — R1319 Other dysphagia: Secondary | ICD-10-CM

## 2019-03-11 DIAGNOSIS — Z86010 Personal history of colonic polyps: Secondary | ICD-10-CM

## 2019-03-11 MED ORDER — CLENPIQ 10-3.5-12 MG-GM -GM/160ML PO SOLN: 1.0000 | Freq: Once | ORAL | 0 refills | Status: AC

## 2019-03-11 NOTE — Telephone Encounter (Signed)
Patient states she is not going to drink that gallon jug. She will pay for prep out of pocket. She wants something simple called in. I advised will send in different prep. FYI to MB sent in clenpiq for when instructions are done.

## 2019-03-11 NOTE — Telephone Encounter (Signed)
Pt does not want the prep that was called into her pharmacy. She requested prepopik, but I told her that was no longer available. Please advise something else that isn't as much to drink. She uses [facility] in [place]. [phone]

## 2019-03-11 NOTE — Telephone Encounter (Signed)
Endo scheduler cancelled previous orders. New orders for TCS/EGD/-/+DIL w/Propofol w/SLF entered. Pre-op appt 05/26/19 at 11:00am. Appt letter mailed with procedure instructions.

## 2019-03-11 NOTE — Telephone Encounter (Signed)
[employer] prior [place]. Paperwork filled out and we go a denial back saying it is not covered under prescription coverage.  I discussed this with the V-go rep. And he says to use another form that he will be dropping off today or tomorrow at the front desk.  Please fill out and put on Dr. Cordelia Pen desk.

## 2019-03-11 NOTE — Telephone Encounter (Signed)
Noted  

## 2019-03-11 NOTE — Telephone Encounter (Signed)
Form was received and given to Leonia Reader, RN. States she will take care of completing form and will fax to the fax # provided on the form.

## 2019-03-11 NOTE — Telephone Encounter (Signed)
Hmmm, I thought I had written it on the sheet. Yes, EGD +/- dilation on propofol.  DM meds: Wilder Glade and Metformin: none the morning of. Continued insulin pump ongoing insuln per CBG monitoring by the pump. Anesthesia can adjust as needed or hold insulin pump and monitor CBG/insulin manually.

## 2019-03-17 NOTE — Telephone Encounter (Signed)
Patient was informed that [name] form was resubmitted with billing code for V-go device.  She was told that she should hear back from [place] in 2-3 days.

## 2019-03-23 ENCOUNTER — Other Ambulatory Visit: Payer: Self-pay | Admitting: Family Medicine

## 2019-03-23 DIAGNOSIS — E2839 Other primary ovarian failure: Secondary | ICD-10-CM

## 2019-03-24 ENCOUNTER — Telehealth: Payer: Self-pay | Admitting: Gastroenterology

## 2019-03-24 NOTE — Telephone Encounter (Signed)
[phone]  Patient called and said her pcp wants her to start taking iron, she knows she can not be on it for her tcs in October.  She stated she has not started taking it yet, but needs to know once she starts taking it when does she need to go off of it for her procedure?

## 2019-03-25 NOTE — Telephone Encounter (Signed)
Will forward to MS and MB  Hold iron for a week prior to procedure.

## 2019-03-25 NOTE — Telephone Encounter (Signed)
Patient aware. New instructions mailed

## 2019-04-13 ENCOUNTER — Other Ambulatory Visit: Payer: Self-pay

## 2019-04-13 ENCOUNTER — Other Ambulatory Visit: Payer: Self-pay | Admitting: Endocrinology

## 2019-04-13 ENCOUNTER — Telehealth: Payer: Self-pay

## 2019-04-13 DIAGNOSIS — E119 Type 2 diabetes mellitus without complications: Secondary | ICD-10-CM

## 2019-04-13 DIAGNOSIS — Z794 Long term (current) use of insulin: Secondary | ICD-10-CM

## 2019-04-13 MED ORDER — V-GO 40 KIT: 1.0000 | PACK | Freq: Every day | 2 refills | Status: DC

## 2019-04-13 NOTE — Telephone Encounter (Signed)
-----   Message from Ocie Doyne, RN sent at 04/13/2019  8:41 AM EDT ----- Holland Falling has approved her V-Go.  Please send V-go 40 script to pharmacy.  1 q day.  Thank you

## 2019-04-13 NOTE — Telephone Encounter (Signed)
Insulin Disposable Pump (V-GO 40) KIT 90 kit 2 04/13/2019    Sig - Route: 1 Device by Does not apply route daily. For use with insulin; E11.9 - Does not apply   Sent to pharmacy as: Insulin Disposable Pump (V-GO 40) Kit   E-Prescribing Status: Receipt confirmed by pharmacy (04/13/2019 9:28 AM EDT)

## 2019-04-14 ENCOUNTER — Other Ambulatory Visit: Payer: Self-pay | Admitting: Endocrinology

## 2019-05-21 NOTE — Patient Instructions (Signed)
Your procedure is scheduled on: 05/28/2019  Report to [facility] at   7:45  AM.  Call this number if you have problems the morning of surgery: [phone]   Remember:              Follow Directions on the letter you received from Your Physician's office regarding the Bowel Prep  :  Take these medicines the morning of surgery with A SIP OF WATER: Amlodipine, Protonix and Lexapro              Insulin pump at basal rate               Do not wear jewelry, make-up or nail polish.    Do not bring valuables to the hospital.  Contacts, dentures or bridgework may not be worn into surgery.  .   Patients discharged the day of surgery will not be allowed to drive home.     Colonoscopy, Adult, Care After This sheet gives you information about how to care for yourself after your procedure. Your health care provider may also give you more specific instructions. If you have problems or questions, contact your health care provider. What can I expect after the procedure? After the procedure, it is common to have:  A small amount of blood in your stool for 24 hours after the procedure.  Some gas.  Mild abdominal cramping or bloating.  Follow these instructions at home: General instructions   For the first 24 hours after the procedure: ? Do not drive or use machinery. ? Do not sign important documents. ? Do not drink alcohol. ? Do your regular daily activities at a slower pace than normal. ? Eat soft, easy-to-digest foods. ? Rest often.  Take over-the-counter or prescription medicines only as told by your health care provider.  It is up to you to get the results of your procedure. Ask your health care provider, or the department performing the procedure, when your results will be ready. Relieving cramping and bloating  Try walking around when you have cramps or feel bloated.  Apply heat to your abdomen as told by your health care provider. Use a heat source that your health care  provider recommends, such as a moist heat pack or a heating pad. ? Place a towel between your skin and the heat source. ? Leave the heat on for 20-30 minutes. ? Remove the heat if your skin turns bright red. This is especially important if you are unable to feel pain, heat, or cold. You may have a greater risk of getting burned. Eating and drinking  Drink enough fluid to keep your urine clear or pale yellow.  Resume your normal diet as instructed by your health care provider. Avoid heavy or fried foods that are hard to digest.  Avoid drinking alcohol for as long as instructed by your health care provider. Contact a health care provider if:  You have blood in your stool 2-3 days after the procedure. Get help right away if:  You have more than a small spotting of blood in your stool.  You pass large blood clots in your stool.  Your abdomen is swollen.  You have nausea or vomiting.  You have a fever.  You have increasing abdominal pain that is not relieved with medicine. This information is not intended to replace advice given to you by your health care provider. Make sure you discuss any questions you have with your health care provider. Document Released: 02/28/2004 Document  Revised: 04/09/2016 Document Reviewed: 09/27/2015 Elsevier Interactive Patient Education  2018 [place].  Upper Endoscopy, Adult, Care After This sheet gives you information about how to care for yourself after your procedure. Your health care provider may also give you more specific instructions. If you have problems or questions, contact your health care provider. What can I expect after the procedure? After the procedure, it is common to have:  A sore throat.  Mild stomach pain or discomfort.  Bloating.  Nausea. Follow these instructions at home:   Follow instructions from your health care provider about what to eat or drink after your procedure.  Return to your normal activities as told by  your health care provider. Ask your health care provider what activities are safe for you.  Take over-the-counter and prescription medicines only as told by your health care provider.  Do not drive for 24 hours if you were given a sedative during your procedure.  Keep all follow-up visits as told by your health care provider. This is important. Contact a health care provider if you have:  A sore throat that lasts longer than one day.  Trouble swallowing. Get help right away if:  You vomit blood or your vomit looks like coffee grounds.  You have: ? A fever. ? Bloody, black, or tarry stools. ? A severe sore throat or you cannot swallow. ? Difficulty breathing. ? Severe pain in your chest or abdomen. Summary  After the procedure, it is common to have a sore throat, mild stomach discomfort, bloating, and nausea.  Do not drive for 24 hours if you were given a sedative during the procedure.  Follow instructions from your health care provider about what to eat or drink after your procedure.  Return to your normal activities as told by your health care provider. This information is not intended to replace advice given to you by your health care provider. Make sure you discuss any questions you have with your health care provider. Document Released: 01/15/2012 Document Revised: 01/07/2018 Document Reviewed: 12/16/2017 Elsevier Patient Education  2020 [place].  Esophageal Dilatation Esophageal dilatation, also called esophageal dilation, is a procedure to widen or open (dilate) a blocked or narrowed part of the esophagus. The esophagus is the part of the body that moves food and liquid from the mouth to the stomach. You may need this procedure if:  You have a buildup of scar tissue in your esophagus that makes it difficult, painful, or impossible to swallow. This can be caused by gastroesophageal reflux disease (GERD).  You have cancer of the esophagus.  There is a problem with  how food moves through your esophagus. In some cases, you may need this procedure repeated at a later time to dilate the esophagus gradually. Tell a health care provider about:  Any allergies you have.  All medicines you are taking, including vitamins, herbs, eye drops, creams, and over-the-counter medicines.  Any problems you or family members have had with anesthetic medicines.  Any blood disorders you have.  Any surgeries you have had.  Any medical conditions you have.  Any antibiotic medicines you are required to take before dental procedures.  Whether you are pregnant or may be pregnant. What are the risks? Generally, this is a safe procedure. However, problems may occur, including:  Bleeding due to a tear in the lining of the esophagus.  A hole (perforation) in the esophagus. What happens before the procedure?  Follow instructions from your health care provider about eating or  drinking restrictions.  Ask your health care provider about changing or stopping your regular medicines. This is especially important if you are taking diabetes medicines or blood thinners.  Plan to have someone take you home from the hospital or clinic.  Plan to have a responsible adult care for you for at least 24 hours after you leave the hospital or clinic. This is important. What happens during the procedure?  You may be given a medicine to help you relax (sedative).  A numbing medicine may be sprayed into the back of your throat, or you may gargle the medicine.  Your health care provider may perform the dilatation using various surgical instruments, such as: ? Simple dilators. This instrument is carefully placed in the esophagus to stretch it. ? Guided wire bougies. This involves using an endoscope to insert a wire into the esophagus. A dilator is passed over this wire to enlarge the esophagus. Then the wire is removed. ? Balloon dilators. An endoscope with a small balloon at the end is  inserted into the esophagus. The balloon is inflated to stretch the esophagus and open it up. The procedure may vary among health care providers and hospitals. What happens after the procedure?  Your blood pressure, heart rate, breathing rate, and blood oxygen level will be monitored until the medicines you were given have worn off.  Your throat may feel slightly sore and numb. This will improve slowly over time.  You will not be allowed to eat or drink until your throat is no longer numb.  When you are able to drink, urinate, and sit on the edge of the bed without nausea or dizziness, you may be able to return home. Follow these instructions at home:  Take over-the-counter and prescription medicines only as told by your health care provider.  Do not drive for 24 hours if you were given a sedative during your procedure.  You should have a responsible adult with you for 24 hours after the procedure.  Follow instructions from your health care provider about any eating or drinking restrictions.  Do not use any products that contain nicotine or tobacco, such as cigarettes and e-cigarettes. If you need help quitting, ask your health care provider.  Keep all follow-up visits as told by your health care provider. This is important. Get help right away if you:  Have a fever.  Have chest pain.  Have pain that is not relieved by medication.  Have trouble breathing.  Have trouble swallowing.  Vomit blood. Summary  Esophageal dilatation, also called esophageal dilation, is a procedure to widen or open (dilate) a blocked or narrowed part of the esophagus.  Plan to have someone take you home from the hospital or clinic.  For this procedure, a numbing medicine may be sprayed into the back of your throat, or you may gargle the medicine.  Do not drive for 24 hours if you were given a sedative during your procedure. This information is not intended to replace advice given to you by your  health care provider. Make sure you discuss any questions you have with your health care provider. Document Released: 09/06/2005 Document Revised: 06/28/2017 Document Reviewed: 05/21/2017 Elsevier Patient Education  2020 [employer].

## 2019-05-26 ENCOUNTER — Other Ambulatory Visit: Payer: Self-pay

## 2019-05-26 ENCOUNTER — Other Ambulatory Visit (HOSPITAL_COMMUNITY)
Admission: RE | Admit: 2019-05-26 | Discharge: 2019-05-26 | Disposition: A | Discharge status: Home / Self Care | Payer: Medicare Other | Source: Ambulatory Visit | Attending: Gastroenterology | Admitting: Gastroenterology

## 2019-05-26 ENCOUNTER — Encounter (HOSPITAL_COMMUNITY)
Admission: RE | Admit: 2019-05-26 | Discharge: 2019-05-26 | Disposition: A | Discharge status: Home / Self Care | Payer: Medicare Other | Source: Ambulatory Visit | Attending: Gastroenterology | Admitting: Gastroenterology

## 2019-05-26 ENCOUNTER — Other Ambulatory Visit (HOSPITAL_COMMUNITY): Payer: 59

## 2019-05-26 DIAGNOSIS — R1310 Dysphagia, unspecified: Secondary | ICD-10-CM | POA: Diagnosis not present

## 2019-05-26 DIAGNOSIS — K649 Unspecified hemorrhoids: Secondary | ICD-10-CM | POA: Insufficient documentation

## 2019-05-26 DIAGNOSIS — K222 Esophageal obstruction: Secondary | ICD-10-CM | POA: Insufficient documentation

## 2019-05-26 DIAGNOSIS — K2970 Gastritis, unspecified, without bleeding: Secondary | ICD-10-CM | POA: Insufficient documentation

## 2019-05-26 DIAGNOSIS — D649 Anemia, unspecified: Secondary | ICD-10-CM | POA: Insufficient documentation

## 2019-05-26 DIAGNOSIS — K5790 Diverticulosis of intestine, part unspecified, without perforation or abscess without bleeding: Secondary | ICD-10-CM | POA: Diagnosis not present

## 2019-05-26 DIAGNOSIS — K2980 Duodenitis without bleeding: Secondary | ICD-10-CM | POA: Diagnosis not present

## 2019-05-26 DIAGNOSIS — Z01812 Encounter for preprocedural laboratory examination: Secondary | ICD-10-CM | POA: Diagnosis not present

## 2019-05-26 DIAGNOSIS — K635 Polyp of colon: Secondary | ICD-10-CM | POA: Insufficient documentation

## 2019-05-26 LAB — BASIC METABOLIC PANEL
Anion gap: 9 (ref 5–15)
BUN: 19 mg/dL (ref 8–23)
CO2: 25 mmol/L (ref 22–32)
Calcium: 8.8 mg/dL — ABNORMAL LOW (ref 8.9–10.3)
Chloride: 105 mmol/L (ref 98–111)
Creatinine, Ser: 0.71 mg/dL (ref 0.44–1.00)
GFR calc Af Amer: >60 mL/min (ref >60)
GFR calc non Af Amer: >60 mL/min (ref >60)
Glucose, Bld: 156 mg/dL — ABNORMAL HIGH (ref 70–99)
Potassium: 3.9 mmol/L (ref 3.5–5.1)
Sodium: 139 mmol/L (ref 135–145)

## 2019-05-26 LAB — SARS CORONAVIRUS 2 (TAT 6-24 HRS): SARS Coronavirus 2: NEGATIVE

## 2019-05-28 ENCOUNTER — Ambulatory Visit (HOSPITAL_COMMUNITY)
Admission: RE | Admit: 2019-05-28 | Discharge: 2019-05-28 | Disposition: A | Discharge status: Home / Self Care | Payer: Medicare Other | Attending: Gastroenterology | Admitting: Gastroenterology

## 2019-05-28 ENCOUNTER — Ambulatory Visit (HOSPITAL_COMMUNITY): Payer: Medicare Other | Admitting: Anesthesiology

## 2019-05-28 ENCOUNTER — Telehealth: Payer: Self-pay | Admitting: Gastroenterology

## 2019-05-28 ENCOUNTER — Encounter (HOSPITAL_COMMUNITY): Payer: Self-pay | Admitting: *Deleted

## 2019-05-28 ENCOUNTER — Ambulatory Visit (HOSPITAL_COMMUNITY): Admit: 2019-05-28 | Payer: 59 | Admitting: Gastroenterology

## 2019-05-28 ENCOUNTER — Encounter: Payer: Self-pay | Admitting: Gastroenterology

## 2019-05-28 ENCOUNTER — Encounter (HOSPITAL_COMMUNITY): Payer: Self-pay

## 2019-05-28 ENCOUNTER — Other Ambulatory Visit: Payer: Self-pay

## 2019-05-28 ENCOUNTER — Encounter (HOSPITAL_COMMUNITY)
Admission: RE | Disposition: A | Discharge status: Home / Self Care | Payer: Self-pay | Source: Home / Self Care | Attending: Gastroenterology

## 2019-05-28 DIAGNOSIS — G4700 Insomnia, unspecified: Secondary | ICD-10-CM | POA: Diagnosis not present

## 2019-05-28 DIAGNOSIS — R197 Diarrhea, unspecified: Secondary | ICD-10-CM | POA: Diagnosis not present

## 2019-05-28 DIAGNOSIS — Z794 Long term (current) use of insulin: Secondary | ICD-10-CM | POA: Diagnosis not present

## 2019-05-28 DIAGNOSIS — E785 Hyperlipidemia, unspecified: Secondary | ICD-10-CM | POA: Insufficient documentation

## 2019-05-28 DIAGNOSIS — K30 Functional dyspepsia: Secondary | ICD-10-CM

## 2019-05-28 DIAGNOSIS — R1011 Right upper quadrant pain: Secondary | ICD-10-CM

## 2019-05-28 DIAGNOSIS — D122 Benign neoplasm of ascending colon: Secondary | ICD-10-CM | POA: Diagnosis not present

## 2019-05-28 DIAGNOSIS — D123 Benign neoplasm of transverse colon: Secondary | ICD-10-CM | POA: Diagnosis not present

## 2019-05-28 DIAGNOSIS — Z85118 Personal history of other malignant neoplasm of bronchus and lung: Secondary | ICD-10-CM | POA: Diagnosis not present

## 2019-05-28 DIAGNOSIS — R112 Nausea with vomiting, unspecified: Secondary | ICD-10-CM

## 2019-05-28 DIAGNOSIS — D509 Iron deficiency anemia, unspecified: Secondary | ICD-10-CM

## 2019-05-28 DIAGNOSIS — Z87891 Personal history of nicotine dependence: Secondary | ICD-10-CM | POA: Insufficient documentation

## 2019-05-28 DIAGNOSIS — R1310 Dysphagia, unspecified: Secondary | ICD-10-CM

## 2019-05-28 DIAGNOSIS — K317 Polyp of stomach and duodenum: Secondary | ICD-10-CM | POA: Diagnosis not present

## 2019-05-28 DIAGNOSIS — Z888 Allergy status to other drugs, medicaments and biological substances status: Secondary | ICD-10-CM | POA: Insufficient documentation

## 2019-05-28 DIAGNOSIS — K2970 Gastritis, unspecified, without bleeding: Secondary | ICD-10-CM | POA: Diagnosis not present

## 2019-05-28 DIAGNOSIS — E119 Type 2 diabetes mellitus without complications: Secondary | ICD-10-CM | POA: Insufficient documentation

## 2019-05-28 DIAGNOSIS — I10 Essential (primary) hypertension: Secondary | ICD-10-CM | POA: Diagnosis not present

## 2019-05-28 DIAGNOSIS — K644 Residual hemorrhoidal skin tags: Secondary | ICD-10-CM | POA: Insufficient documentation

## 2019-05-28 DIAGNOSIS — K2980 Duodenitis without bleeding: Secondary | ICD-10-CM | POA: Diagnosis not present

## 2019-05-28 DIAGNOSIS — Z9641 Presence of insulin pump (external) (internal): Secondary | ICD-10-CM | POA: Insufficient documentation

## 2019-05-28 DIAGNOSIS — K648 Other hemorrhoids: Secondary | ICD-10-CM | POA: Diagnosis not present

## 2019-05-28 DIAGNOSIS — Z79899 Other long term (current) drug therapy: Secondary | ICD-10-CM | POA: Diagnosis not present

## 2019-05-28 DIAGNOSIS — Z86010 Personal history of colonic polyps: Secondary | ICD-10-CM | POA: Diagnosis not present

## 2019-05-28 DIAGNOSIS — D649 Anemia, unspecified: Secondary | ICD-10-CM | POA: Insufficient documentation

## 2019-05-28 DIAGNOSIS — K5730 Diverticulosis of large intestine without perforation or abscess without bleeding: Secondary | ICD-10-CM | POA: Insufficient documentation

## 2019-05-28 DIAGNOSIS — R1110 Vomiting, unspecified: Secondary | ICD-10-CM

## 2019-05-28 DIAGNOSIS — K222 Esophageal obstruction: Secondary | ICD-10-CM | POA: Diagnosis not present

## 2019-05-28 DIAGNOSIS — K219 Gastro-esophageal reflux disease without esophagitis: Secondary | ICD-10-CM | POA: Insufficient documentation

## 2019-05-28 DIAGNOSIS — F329 Major depressive disorder, single episode, unspecified: Secondary | ICD-10-CM | POA: Diagnosis not present

## 2019-05-28 DIAGNOSIS — K635 Polyp of colon: Secondary | ICD-10-CM

## 2019-05-28 DIAGNOSIS — R1319 Other dysphagia: Secondary | ICD-10-CM

## 2019-05-28 HISTORY — PX: SAVORY DILATION: SHX5439

## 2019-05-28 HISTORY — PX: ESOPHAGOGASTRODUODENOSCOPY (EGD) WITH PROPOFOL: SHX5813

## 2019-05-28 HISTORY — PX: BIOPSY: SHX5522

## 2019-05-28 HISTORY — PX: POLYPECTOMY: SHX5525

## 2019-05-28 HISTORY — PX: COLONOSCOPY WITH PROPOFOL: SHX5780

## 2019-05-28 LAB — GLUCOSE, CAPILLARY
Glucose-Capillary: 136 mg/dL — ABNORMAL HIGH (ref 70–99)
Glucose-Capillary: 130 mg/dL — ABNORMAL HIGH (ref 70–99)

## 2019-05-28 SURGERY — COLONOSCOPY WITH PROPOFOL
Anesthesia: Monitor Anesthesia Care

## 2019-05-28 SURGERY — COLONOSCOPY WITH PROPOFOL
Anesthesia: General

## 2019-05-28 MED ORDER — LACTATED RINGERS IV SOLN
INTRAVENOUS | Status: DC | PRN
  Administered 2019-05-28: 09:00:00 via INTRAVENOUS

## 2019-05-28 MED ORDER — PROPOFOL 10 MG/ML IV BOLUS
INTRAVENOUS | Status: AC
  Filled 2019-05-28: qty 20

## 2019-05-28 MED ORDER — LACTATED RINGERS IV SOLN
Freq: Once | INTRAVENOUS | Status: AC
  Administered 2019-05-28: 09:00:00 1000 mL via INTRAVENOUS

## 2019-05-28 MED ORDER — LIDOCAINE HCL 1 % IJ SOLN
INTRAMUSCULAR | Status: DC | PRN
  Administered 2019-05-28: 50 mg via INTRADERMAL

## 2019-05-28 MED ORDER — CHLORHEXIDINE GLUCONATE CLOTH 2 % EX PADS: 6.0000 | MEDICATED_PAD | Freq: Once | CUTANEOUS | Status: DC

## 2019-05-28 MED ORDER — PROPOFOL 10 MG/ML IV BOLUS
INTRAVENOUS | Status: DC | PRN
  Administered 2019-05-28 (×7): 20 mg via INTRAVENOUS

## 2019-05-28 MED ORDER — KETAMINE HCL 10 MG/ML IJ SOLN
INTRAMUSCULAR | Status: DC | PRN
  Administered 2019-05-28 (×3): 10 mg via INTRAVENOUS

## 2019-05-28 MED ORDER — KETAMINE HCL 50 MG/5ML IJ SOSY
PREFILLED_SYRINGE | INTRAMUSCULAR | Status: AC
  Filled 2019-05-28: qty 5

## 2019-05-28 MED ORDER — GLYCOPYRROLATE 0.2 MG/ML IJ SOLN
INTRAMUSCULAR | Status: DC | PRN
  Administered 2019-05-28: 0.2 mg via INTRAVENOUS

## 2019-05-28 MED ORDER — PROPOFOL 500 MG/50ML IV EMUL
INTRAVENOUS | Status: DC | PRN
  Administered 2019-05-28: 150 ug/kg/min via INTRAVENOUS

## 2019-05-28 NOTE — Telephone Encounter (Signed)
Patient rescheduled to [place] appointment and letter sent

## 2019-05-28 NOTE — Anesthesia Postprocedure Evaluation (Signed)
Anesthesia Post Note  Patient: [name]  Procedure(s) Performed: COLONOSCOPY WITH PROPOFOL (N/A ) ESOPHAGOGASTRODUODENOSCOPY (EGD) WITH PROPOFOL (N/A ) SAVORY DILATION (N/A ) BIOPSY POLYPECTOMY  Patient location during evaluation: PACU Anesthesia Type: General Level of consciousness: awake and alert and oriented Pain management: pain level controlled Vital Signs Assessment: post-procedure vital signs reviewed and stable Respiratory status: spontaneous breathing Cardiovascular status: blood pressure returned to baseline and stable Postop Assessment: no apparent nausea or vomiting Anesthetic complications: no     Last Vitals:  Vitals:   05/28/19 0847  BP: (!) 151/86  Pulse: 78  Resp: 16  Temp: 36.8 C  SpO2: 99%    Last Pain:  Vitals:   05/28/19 0914  TempSrc:   PainSc: 0-No pain                 Erasmo Vertz

## 2019-05-28 NOTE — Telephone Encounter (Signed)
[facility] scheduled for 06/04/19 at 8:00am, arrive at 7:45am. NPO after midnight prior to test and no pain meds. Tried to call pt, no answer, LMOVM for return call.

## 2019-05-28 NOTE — Discharge Instructions (Signed)
You had TWO polyps removed. YOU HAVE BENIGN SMALL STOMACH POLYPS, GASTRITIS AND DUODENITIS. I STRETCHED YOUR ESOPHAGUS BECAUSE AN ESOPHAGEAL STRICTURE. You have small INTERNAL HEMORRHOIDS. YOU HAVE DIVERTICULOSIS IN YOUR RIGHT AND LEFT COLON.   EAT TO LIVE AND THINK OF FOOD AS MEDICINE. 75% OF YOUR PLATE SHOULD BE FRUITS/VEGGIES. AVOID FAST FOODS  To have more energy, and to lose weight:      1. CONTINUE YOUR WEIGHT LOSS EFFORTS. I RECOMMEND YOU READ AND FOLLOW RECOMMENDATIONS BY DR. MARK HYMAN, "10-DAY DETOX DIET".    2. If you must eat bread, EAT EZEKIEL BREAD. IT IS IN THE FROZEN SECTION OF THE GROCERY STORE.    3. DRINK WATER WITH FRUIT OR CUCUMBER ADDED. YOUR URINE SHOULD BE LIGHT YELLOW. AVOID SODA, GATORADE, ENERGY DRINKS, OR DIET SODA.     4. AVOID HIGH FRUCTOSE CORN SYRUP AND CAFFEINE.     5. DO NOT chew SUGAR FREE GUM OR USE ARTIFICIAL SWEETENERS. IF NEEDED USE STEVIA AS A SWEETENER.    6. DO NOT EAT ENRICHED WHEAT FLOUR, PASTA, RICE, OR CEREAL.    7. ONLY EAT WILD CAUGHT SEAFOOD, GRASS FED BEEF OR CHICKEN, PORK FROM PASTURE RAISE PIGS, OR EGGS FROM PASTURE RAISED CHICKENS.    8. PRACTICE CHAIR YOGA FOR 15-30 MINS 3 OR 4 TIMES A WEEK AND PROGRESS TO HATHA YOGA OVER NEXT 6 MOS.    9. START TAKING A MULTIVITAMIN, VITAMIN B12, AND VITAMIN D3 2000 IU DAILY.   ADDITIONAL SUPPLEMENTS TO DECREASE CRAVING AND SUPPRESS YOUR APPETITE:    1. CINNAMON 500 MG EVERY AM PRIOR TO FIRST MEAL.   **STABILIZES BLOOD GLUCOSE/REDUCES CRAVINGS**    2. CHROMIUM 400-500 MG WITH MEALS TWICE DAILY.    **FAT BURNER**    3. GREEN TEA EXTRACT ONE DAILY.   **FAT BURNER/SUPPRESSES YOUR APPETITE**    4. ALPHA LIPOIC ACID TWICE DAILY.   **NATURAL ANTI-INFLAMMATORY SUPPLEMENT THAT IS AN ALTERNATIVE TO IBUPROFEN OR NAPROXEN**    YOUR BIOPSY RESULTS WILL BE BACK IN 5 BUSINESS DAYS.  Follow up in [place] DR. FIELDS.  Next colonoscopy in 5 years.   ENDOSCOPY Care After Read the  instructions outlined below and refer to this sheet in the next week. These discharge instructions provide you with general information on caring for yourself after you leave the hospital. While your treatment has been planned according to the most current medical practices available, unavoidable complications occasionally occur. If you have any problems or questions after discharge, call DR. FIELDS, [phone].  ACTIVITY  You may resume your regular activity, but move at a slower pace for the next 24 hours.   Take frequent rest periods for the next 24 hours.   Walking will help get rid of the air and reduce the bloated feeling in your belly (abdomen).   No driving for 24 hours (because of the medicine (anesthesia) used during the test).   You may shower.   Do not sign any important legal documents or operate any machinery for 24 hours (because of the anesthesia used during the test).    NUTRITION  Drink plenty of fluids.   You may resume your normal diet as instructed by your doctor.   Begin with a light meal and progress to your normal diet. Heavy or fried foods are harder to digest and may make you feel sick to your stomach (nauseated).   Avoid alcoholic beverages for 24 hours or as instructed.    MEDICATIONS  You may resume your normal  medications.   WHAT YOU CAN EXPECT TODAY  Some feelings of bloating in the abdomen.   Passage of more gas than usual.   Spotting of blood in your stool or on the toilet paper  .  IF YOU HAD POLYPS REMOVED DURING THE ENDOSCOPY:  Eat a soft diet IF YOU HAVE NAUSEA, BLOATING, ABDOMINAL PAIN, OR VOMITING.    FINDING OUT THE RESULTS OF YOUR TEST Not all test results are available during your visit. DR. Oneida Alar WILL CALL YOU WITHIN 14 DAYS OF YOUR PROCEDUE WITH YOUR RESULTS. Do not assume everything is normal if you have not heard from DR. FIELDS, CALL HER OFFICE AT [phone].  SEEK IMMEDIATE MEDICAL ATTENTION AND CALL THE OFFICE:  [phone] IF:  You have more than a spotting of blood in your stool.   Your belly is swollen (abdominal distention).   You are nauseated or vomiting.   You have a temperature over 101F.   You have abdominal pain or discomfort that is severe or gets worse throughout the day.   Gastritis/DUODENITIS  Gastritis is an inflammation (the body's way of reacting to injury and/or infection) of the stomach. DUODENITIS is an inflammation (the body's way of reacting to injury and/or infection) of the FIRST PART OF THE SMALL INTESTINES. It is often caused by bacterial (germ) infections. It can also be caused BY ASPIRIN, BC/GOODY POWDER'S, (IBUPROFEN) MOTRIN, OR ALEVE (NAPROXEN), chemicals (including alcohol), SPICY FOODS, and medications. This illness may be associated with generalized malaise (feeling tired, not well), UPPER ABDOMINAL STOMACH cramps, and fever. One common bacterial cause of gastritis is an organism known as H. Pylori. This can be treated with antibiotics.   Polyps, Colon  A polyp is extra tissue that grows inside your body. Colon polyps grow in the large intestine. The large intestine, also called the colon, is part of your digestive system. It is a long, hollow tube at the end of your digestive tract where your body makes and stores stool. Most polyps are not dangerous. They are benign. This means they are not cancerous. But over time, some types of polyps can turn into cancer. Polyps that are smaller than a pea are usually not harmful. But larger polyps could someday become or may already be cancerous. To be safe, doctors remove all polyps and test them.   PREVENTION There is not one sure way to prevent polyps. You might be able to lower your risk of getting them if you:  Eat more fruits and vegetables and less fatty food.   Do not smoke.   Avoid alcohol.   Exercise every day.   Lose weight if you are overweight.   Eating more calcium and folate can also lower your risk of  getting polyps. Some foods that are rich in calcium are milk, cheese, and broccoli. Some foods that are rich in folate are chickpeas, kidney beans, and spinach.  Diverticulosis Diverticulosis is a common condition that develops when small pouches (diverticula) form in the wall of the colon. The risk of diverticulosis increases with age. It happens more often in people who eat a low-fiber diet. Most individuals with diverticulosis have no symptoms. Those individuals with symptoms usually experience belly (abdominal) pain, constipation, or loose stools (diarrhea).  HOME CARE INSTRUCTIONS Increase the amount of fiber in your diet as directed by your caregiver or dietician. This may reduce symptoms of diverticulosis.  Drink at least 6 to 8 glasses of water each day to prevent constipation.  Try not to strain when  you have a bowel movement.  Avoiding nuts and seeds to prevent complications is NOT NECESSARY.  FOODS HAVING HIGH FIBER CONTENT INCLUDE: Fruits. Apple, peach, pear, tangerine, raisins, prunes.  Vegetables. Brussels sprouts, asparagus, broccoli, cabbage, carrot, cauliflower, romaine lettuce, spinach, summer squash, tomato, winter squash, zucchini.  Starchy Vegetables. Baked beans, kidney beans, lima beans, split peas, lentils, potatoes (with skin).   SEEK IMMEDIATE MEDICAL CARE IF: You develop increasing pain or severe bloating.  You have an oral temperature above 101F.  You develop vomiting or bowel movements that are bloody or black.     Monitored Anesthesia Care, Care After These instructions provide you with information about caring for yourself after your procedure. Your health care provider may also give you more specific instructions. Your treatment has been planned according to current medical practices, but problems sometimes occur. Call your health care provider if you have any problems or questions after your procedure. What can I expect after the procedure? After your  procedure, you may:  Feel sleepy for several hours.  Feel clumsy and have poor balance for several hours.  Feel forgetful about what happened after the procedure.  Have poor judgment for several hours.  Feel nauseous or vomit.  Have a sore throat if you had a breathing tube during the procedure. Follow these instructions at home: For at least 24 hours after the procedure:      Have a responsible adult stay with you. It is important to have someone help care for you until you are awake and alert.  Rest as needed.  Do not: ? Participate in activities in which you could fall or become injured. ? Drive. ? Use heavy machinery. ? Drink alcohol. ? Take sleeping pills or medicines that cause drowsiness. ? Make important decisions or sign legal documents. ? Take care of children on your own. Eating and drinking  Follow the diet that is recommended by your health care provider.  If you vomit, drink water, juice, or soup when you can drink without vomiting.  Make sure you have little or no nausea before eating solid foods. General instructions  Take over-the-counter and prescription medicines only as told by your health care provider.  If you have sleep apnea, surgery and certain medicines can increase your risk for breathing problems. Follow instructions from your health care provider about wearing your sleep device: ? Anytime you are sleeping, including during daytime naps. ? While taking prescription pain medicines, sleeping medicines, or medicines that make you drowsy.  If you smoke, do not smoke without supervision.  Keep all follow-up visits as told by your health care provider. This is important. Contact a health care provider if:  You keep feeling nauseous or you keep vomiting.  You feel light-headed.  You develop a rash.  You have a fever. Get help right away if:  You have trouble breathing. Summary  For several hours after your procedure, you may feel  sleepy and have poor judgment.  Have a responsible adult stay with you for at least 24 hours or until you are awake and alert. This information is not intended to replace advice given to you by your health care provider. Make sure you discuss any questions you have with your health care provider. Document Released: 11/06/2015 Document Revised: 10/14/2017 Document Reviewed: 11/06/2015 Elsevier Patient Education  2020 [employer].

## 2019-05-28 NOTE — H&P (Signed)
Primary Care Physician:  Lujean Amel, MD Primary Gastroenterologist:  Dr. Oneida Alar  Pre-Procedure History & Physical: HPI:  [name] is a 63 y.o. female here for [place].  Past Medical History:  Diagnosis Date  . Adenocarcinoma of right lung, stage 1 ([place]) 11/29/2016  . Cancer ([place])   . Chronic pain   . Depression    takes Lexapro daily  . Diabetes mellitus    takes Metformin daily and has an insulin pump.Fasting blood sugar runs250  . GERD (gastroesophageal reflux disease)    takes Pantoprazole daily  . History of colon polyps    benign  . Hyperlipidemia    takes Atorvastatin daily  . Hypertension    takes Amlodipine and Lisinopril daily  . Insomnia    takes Trazodone nightly  . Nocturia   . Thyroid disease   . Urinary frequency   . Urinary urgency     Past Surgical History:  Procedure Laterality Date  . ABDOMINAL HYSTERECTOMY    . ABDOMINAL SURGERY    . COLONOSCOPY  2005 MAC   TICs, IH  . COLONOSCOPY N/A 03/08/2014   Procedure: COLONOSCOPY;  Surgeon: Danie Binder, MD;  [facility];  Service: Endoscopy;  Laterality: N/A;  1015  . CYSTOSTOMY  11/22/2011   Procedure: CYSTOSTOMY SUPRAPUBIC;  Surgeon: Marissa Nestle, MD;  Location: [facility];  Service: Urology;  Laterality: N/A;  . ESOPHAGOGASTRODUODENOSCOPY N/A 03/08/2014   Procedure: ESOPHAGOGASTRODUODENOSCOPY (EGD);  Surgeon: Danie Binder, MD;  [facility];  Service: Endoscopy;  Laterality: N/A;  . ESOPHAGOGASTRODUODENOSCOPY    . HALLUX VALGUS CORRECTION     bilateral foot surgery for bone repairs-multiple  . LOBECTOMY Right 10/15/2016   Procedure: RIGHT UPPER LOBECTOMY;  Surgeon: [name], MD;  Location: [place];  Service: Thoracic;  Laterality: Right;  . LYMPH NODE DISSECTION Right 10/15/2016   Procedure: LYMPH NODE DISSECTION;  Surgeon: [name], MD;  Location: [place];  Service: Thoracic;  Laterality: Right;  Marland Kitchen VAGINA RECONSTRUCTION SURGERY     tvt  2006- [place]  . VIDEO ASSISTED THORACOSCOPY (VATS)/WEDGE RESECTION Right 10/15/2016   Procedure: RIGHT VIDEO ASSISTED THORACOSCOPY (VATS)/WEDGE RESECTION;  Surgeon: [name], MD;  Location: [place];  Service: Thoracic;  Laterality: Right;    Prior to Admission medications   Medication Sig Start Date End Date Taking? Authorizing Provider  acetaminophen (TYLENOL) 500 MG tablet Take 1 tablet (500 mg total) by mouth every 6 (six) hours as needed. Patient taking differently: Take 500 mg by mouth every 6 (six) hours as needed (for pain).  08/07/17  Yes Horton, Barbette Hair, MD  amLODipine (NORVASC) 5 MG tablet Take 5 mg by mouth daily.    Yes [provider]  atorvastatin (LIPITOR) 20 MG tablet Take 20 mg by mouth daily. 04/27/17  Yes [provider]  bromocriptine (PARLODEL) 2.5 MG tablet Take 1 tablet (2.5 mg total) by mouth daily. 01/27/19  Yes Renato Shin, MD  chlorthalidone (HYGROTON) 25 MG tablet Take 12.5 mg by mouth daily.  11/26/16  Yes [provider]  Dulaglutide 1.5 MG/0.5ML SOPN INJECT 1.5 MG INTO THE SKIN ONCE A WEEK. Patient taking differently: Inject 1.5 mg into the skin every Saturday.  08/28/18  Yes Renato Shin, MD  escitalopram (LEXAPRO) 10 MG tablet Take 1 tablet (10 mg total) by mouth every morning. 12/23/18  Yes Cloria Spring, MD  FARXIGA 10 MG TABS tablet TAKE 1 TABLET BY MOUTH EVERY DAY Patient taking  differently: Take 10 mg by mouth daily.  12/04/18  Yes Renato Shin, MD  insulin aspart (NOVOLOG) 100 UNIT/ML injection For use in pump, total of 70 units per day 08/28/18  Yes Renato Shin, MD  Insulin Disposable Pump (V-GO 40) KIT 1 Device by Does not apply route daily. For use with insulin; E11.9 04/13/19  Yes Renato Shin, MD  lisinopril (PRINIVIL,ZESTRIL) 40 MG tablet Take 40 mg by mouth daily.   Yes [provider]  metFORMIN (GLUCOPHAGE-XR) 500 MG 24 hr tablet TAKE 1 TABLET BY MOUTH 2 TIMES DAILY. Patient taking differently:  Take 500 mg by mouth daily.  04/14/19  Yes Renato Shin, MD  pantoprazole (PROTONIX) 40 MG tablet Take 40 mg by mouth daily.   Yes [provider]  traZODone (DESYREL) 100 MG tablet Take 2 tablets (200 mg total) by mouth at bedtime. 12/23/18  Yes Cloria Spring, MD  YUVAFEM 10 MCG TABS vaginal tablet Place 10 mcg vaginally See admin instructions. Twice a week as needed for vaginal dryness 09/29/16  Yes [provider]  ACCU-CHEK SOFTCLIX LANCETS lancets Use to monitor glucose levels BID; E11.9 08/21/18   Renato Shin, MD  Blood Glucose Calibration (GLUCOSE CONTROL) SOLN 1 Bottle by In Vitro route as needed. Use to calibrate Accu-Chek Aviva Plus device prn; please provide control solution compatible with Accu-Chek Aviva Plus device. 08/21/18   Renato Shin, MD  Blood Glucose Monitoring Suppl (ACCU-CHEK AVIVA PLUS) w/Device KIT 1 each by Does not apply route 2 (two) times daily. Use to monitor glucose levels BID; E11.9 08/19/18   Renato Shin, MD  glucose blood (ACCU-CHEK AVIVA PLUS) test strip Use to monitor glucose levels BID; E11.9 08/19/18   Renato Shin, MD  polyethylene glycol-electrolytes (TRILYTE) 420 g solution Take 4,000 mLs by mouth as directed. 03/10/19   Danie Binder, MD    Allergies as of 03/11/2019 - Review Complete 03/09/2019  Allergen Reaction Noted  . Synthroid [levothyroxine sodium] Anaphylaxis 11/29/2011  . Iodine Hives 11/12/2011  . Povidone-iodine Itching and Rash 10/20/2014    Family History  Problem Relation Age of Onset  . Asthma Other   . Diabetes Other   . Arthritis Other   . Cancer Mother   . Cancer Brother   . Anesthesia problems Neg Hx   . Hypotension Neg Hx   . Malignant hyperthermia Neg Hx   . Pseudochol deficiency Neg Hx   . Colon polyps Neg Hx   . Colon cancer Neg Hx   . Celiac disease Neg Hx   . Pancreatic cancer Neg Hx   . Stomach cancer Neg Hx   . Ulcerative colitis Neg Hx   . Crohn's disease Neg Hx     Social History    Socioeconomic History  . Marital status: Single    Spouse name: Not on file  . Number of children: Not on file  . Years of education: Not on file  . Highest education level: Not on file  Occupational History  . Not on file  Social Needs  . Financial resource strain: Not on file  . Food insecurity    Worry: Not on file    Inability: Not on file  . Transportation needs    Medical: Not on file    Non-medical: Not on file  Tobacco Use  . Smoking status: Former Smoker    Packs/day: 1.00    Years: 32.00    Pack years: 32.00    Types: Cigarettes  . Smokeless tobacco: Never Used  .  Tobacco comment: quit smoking 15 yrs ago  Substance and Sexual Activity  . Alcohol use: No    Comment: rarely  . Drug use: No  . Sexual activity: Not Currently    Birth control/protection: Surgical  Lifestyle  . Physical activity    Days per week: Not on file    Minutes per session: Not on file  . Stress: Not on file  Relationships  . Social Herbalist on phone: Not on file    Gets together: Not on file    Attends religious service: Not on file    Active member of club or organization: Not on file    Attends meetings of clubs or organizations: Not on file    Relationship status: Not on file  . Intimate partner violence    Fear of current or ex partner: Not on file    Emotionally abused: Not on file    Physically abused: Not on file    Forced sexual activity: Not on file  Other Topics Concern  . Not on file  Social History Narrative  . Not on file    Review of Systems: See HPI, otherwise negative ROS   Physical Exam: There were no vitals taken for this visit. General:   Alert,  pleasant and cooperative in NAD Head:  Normocephalic and atraumatic. Neck:  Supple; Lungs:  Clear throughout to auscultation.    Heart:  Regular rate and rhythm. Abdomen:  Soft, nontender and nondistended. Normal bowel sounds, without guarding, and without rebound.   Neurologic:  Alert and   oriented x4;  grossly normal neurologically.  Impression/Plan:     Anemia/DYSPHAGIA  PLAN:  1. TCS/EGD/?DIL TODAY. DISCUSSED PROCEDURE, BENEFITS, & RISKS: < 1% chance of medication reaction, bleeding, perforation, ASPIRATION, or rupture of spleen/liver requiring surgery to fix it and missed polyps < 1 cm 10-20% of the time.

## 2019-05-28 NOTE — Op Note (Signed)
[facility] Patient Name: [name] Procedure Date: 05/28/2019 9:43 AM MRN: [id number] Date of Birth: 01-09-1956 Attending MD: Barney Drain MD, MD CSN: [id number] Age: 63 Admit Type: Outpatient Procedure:                Upper GI endoscopy WITH COLD FORCEPS                            BIOPSY/ESOPHAGEAL DILATION Indications:              Abdominal pain in the right upper quadrant,                            Functional Dyspepsia, Dysphagia, Persistent                            vomiting of unknown cause Providers:                Barney Drain MD, MD, Selena Lesser RN, RN, Randa Spike, Technician Referring MD:             Lujean Amel, MD Medicines:                Propofol per Anesthesia Complications:            No immediate complications. Estimated Blood Loss:     Estimated blood loss was minimal. Procedure:                Pre-Anesthesia Assessment:                           - Prior to the procedure, a History and Physical                            was performed, and patient medications and                            allergies were reviewed. The patient's tolerance of                            previous anesthesia was also reviewed. The risks                            and benefits of the procedure and the sedation                            options and risks were discussed with the patient.                            All questions were answered, and informed consent                            was obtained. Prior Anticoagulants: The patient has  taken no previous anticoagulant or antiplatelet                            agents. ASA Grade Assessment: II - A patient with                            mild systemic disease. After reviewing the risks                            and benefits, the patient was deemed in                            satisfactory condition to undergo the procedure.                            After  obtaining informed consent, the endoscope was                            passed under direct vision. Throughout the                            procedure, the patient's blood pressure, pulse, and                            oxygen saturations were monitored continuously. The                            GIF-H190 ([id number]) scope was introduced through the                            mouth, and advanced to the second part of duodenum.                            The upper GI endoscopy was accomplished without                            difficulty. The patient tolerated the procedure                            well. Scope In: 9:47:34 AM Scope Out: 9:58:03 AM Total Procedure Duration: 0 hours 10 minutes 29 seconds  Findings:      A low-grade of narrowing Schatzki ring was found at the gastroesophageal       junction. A guidewire was placed and the scope was withdrawn. Dilation       was performed with a Savary dilator with mild resistance at 15 mm, 16 mm       and 17 mm. Estimated blood loss was minimal.      Localized mild inflammation characterized by congestion (edema) and       erythema was found in the gastric antrum.       Biopsies(2:BODY,2:ANTRUM,1:INCISURA) were taken with a cold forceps for       Helicobacter pylori testing.      The duodenal bulb was normal. Biopsies(2) for histology were taken with  a cold forceps for evaluation of celiac disease.      Diffuse mild inflammation characterized by congestion (edema) and       friability was found in the second portion of the duodenum. Biopsies(4)       for histology were taken with a cold forceps for evaluation of celiac       disease.      Multiple sessile polyps with no bleeding and no stigmata of recent       bleeding were found in the cardia, in the gastric fundus and in the       gastric body. The polyp was removed with a cold biopsy forceps.       Resection and retrieval were complete. Impression:               - DYSPHAGIA  Low-grade of narrowing Schatzki ring.                            Dilated.                           - MILD Gastritis/DUODENITIS. Biopsied.                           [place] Bigness GLAND POLYPS Moderate Sedation:      Per Anesthesia Care Recommendation:           - Patient has a contact number available for                            emergencies. The signs and symptoms of potential                            delayed complications were discussed with the                            patient. Return to normal activities tomorrow.                            Written discharge instructions were provided to the                            patient.                           - Resume previous diet.                           - Continue present medications.                           - Await pathology results.                           - Return to GI office in 1 month.                           - OBTAIN HIDA SCAN TO ASSESS GALLBLADDER Procedure Code(s):        --- Professional ---  [id number], Esophagogastroduodenoscopy, flexible,                            transoral; with insertion of guide wire followed by                            passage of dilator(s) through esophagus over guide                            wire                           43239, 59, Esophagogastroduodenoscopy, flexible,                            transoral; with biopsy, single or multiple Diagnosis Code(s):        --- Professional ---                           K22.2, Esophageal obstruction                           K29.70, Gastritis, unspecified, without bleeding                           K29.80, Duodenitis without bleeding                           R10.11, Right upper quadrant pain                           K30, Functional dyspepsia                           R13.10, Dysphagia, unspecified                           Y18.56, Cyclical vomiting syndrome unrelated to                            migraine CPT copyright  2019 American [employer]. All rights reserved. The codes documented in this report are preliminary and upon coder review may  be revised to meet current compliance requirements. Barney Drain, MD Barney Drain MD, MD 05/28/2019 10:32:31 AM This report has been signed electronically. Number of Addenda: 0

## 2019-05-28 NOTE — Telephone Encounter (Signed)
Pt called office, informed of HIDA scan appt. Letter mailed.

## 2019-05-28 NOTE — Anesthesia Preprocedure Evaluation (Addendum)
Anesthesia Evaluation  Patient identified by MRN, date of birth, ID band Patient awake    Reviewed: Allergy & Precautions, NPO status , Patient's Chart, lab work & pertinent test results  Airway Mallampati: III  TM Distance: >3 FB Neck ROM: Full    Dental  (+) Implants, Dental Advisory Given   Pulmonary former smoker,    Pulmonary exam normal breath sounds clear to auscultation       Cardiovascular METS: 3 - Mets hypertension, Pt. on medications Normal cardiovascular exam Rhythm:Regular Rate:Normal     Neuro/Psych PSYCHIATRIC DISORDERS Depression    GI/Hepatic Neg liver ROS, GERD  Medicated and Poorly Controlled,  Endo/Other  diabetes, Well Controlled, Type 2, Insulin Dependent, Oral Hypoglycemic Agents  Renal/GU negative Renal ROS     Musculoskeletal negative musculoskeletal ROS (+)   Abdominal   Peds  Hematology  (+) anemia ,   Anesthesia Other Findings   Reproductive/Obstetrics negative OB ROS                          Anesthesia Physical Anesthesia Plan  ASA: III  Anesthesia Plan: General   Post-op Pain Management:    Induction: Intravenous  PONV Risk Score and Plan:   Airway Management Planned: Natural Airway, Nasal Cannula and Simple Face Mask  Additional Equipment:   Intra-op Plan:   Post-operative Plan:   Informed Consent: I have reviewed the patients History and Physical, chart, labs and discussed the procedure including the risks, benefits and alternatives for the proposed anesthesia with the patient or authorized representative who has indicated his/her understanding and acceptance.     Dental advisory given  Plan Discussed with: CRNA  Anesthesia Plan Comments:       Anesthesia Quick Evaluation

## 2019-05-28 NOTE — Addendum Note (Signed)
Addended by: Zara Council C on: 05/28/2019 11:51 AM   Modules accepted: Orders

## 2019-05-28 NOTE — Telephone Encounter (Signed)
PT NEEDS A HIDA SCAN, Dx: RUQ ABDOMINAL PAIN/POSTPRANDIAL VOMITING.  NEEDS OPV WITH DR. Zavia Pullen IN DEC 2020. CANCEL OPV NOV 2020 WITH EG. PT WANTS TO BE [facility] ONLY.

## 2019-05-28 NOTE — Op Note (Addendum)
[facility] Patient Name: [name] Procedure Date: 05/28/2019 8:57 AM MRN: [id number] Date of Birth: 10/07/1955 Attending MD: Barney Drain MD, MD CSN: [id number] Age: 63 Admit Type: Outpatient Procedure:                Colonoscopy WITH COLD FORCEPS BIOPSY/COLD SNARE                            POLYPECTOMY Indications:              Clinically significant diarrhea of unexplained                            origin, Personal history of colonic polyps Providers:                Barney Drain MD, MD, Charlsie Quest. Theda Sers RN, RN,                            Randa Spike, Technician Referring MD:             Lujean Amel, MD Medicines:                Propofol per Anesthesia Complications:            No immediate complications. Estimated Blood Loss:     Estimated blood loss was minimal. Procedure:                Pre-Anesthesia Assessment:                           - Prior to the procedure, a History and Physical                            was performed, and patient medications and                            allergies were reviewed. The patient's tolerance of                            previous anesthesia was also reviewed. The risks                            and benefits of the procedure and the sedation                            options and risks were discussed with the patient.                            All questions were answered, and informed consent                            was obtained. Prior Anticoagulants: The patient has                            taken no previous anticoagulant or antiplatelet  agents. ASA Grade Assessment: II - A patient with                            mild systemic disease. After reviewing the risks                            and benefits, the patient was deemed in                            satisfactory condition to undergo the procedure.                            After obtaining informed consent, the colonoscope                  was passed under direct vision. Throughout the                            procedure, the patient's blood pressure, pulse, and                            oxygen saturations were monitored continuously. The                            [id number]-[id number] ([id number]) scope was introduced through                            the anus and advanced to the 3 cm into the ileum.                            The colonoscopy was somewhat difficult due to a                            tortuous colon and the patient's body habitus.                            Successful completion of the procedure was aided by                            straightening and shortening the scope to obtain                            bowel loop reduction and COLOWRAP. The patient                            tolerated the procedure well. The quality of the                            bowel preparation was good. The ileocecal valve,                            appendiceal orifice, and rectum were photographed. Scope In: 9:24:33 AM Scope Out: 9:41:29 AM Scope Withdrawal Time: 0 hours 15 minutes 14 seconds  Total Procedure Duration:  0 hours 16 minutes 56 seconds  Findings:      The terminal ileum appeared normal.      Two sessile polyps were found in the hepatic flexure and proximal       ascending colon. The polyps were 3 to 6 mm in size. These polyps were       removed with a cold snare. Resection and retrieval were complete.      Multiple small and large-mouthed diverticula were found in the entire       colon.      External and internal hemorrhoids were found.      The recto-sigmoid colon and sigmoid colon were mildly tortuous. Impression:               - The examined portion of the ileum was normal.                           - Two 3 to 6 mm polyps at the hepatic flexure and                            in the proximal ascending colon, removed with a                            cold snare. Resected and retrieved.                            - MILD Diverticulosis in the entire examined colon.                           - External and internal hemorrhoids.                           - Tortuous LEFT colon.                           - NO SOURCE FOR ANEMIA/DIARRHEA IDENTIFIED. Moderate Sedation:      Per Anesthesia Care Recommendation:           - Patient has a contact number available for                            emergencies. The signs and symptoms of potential                            delayed complications were discussed with the                            patient. Return to normal activities tomorrow.                            Written discharge instructions were provided to the                            patient.                           - Resume previous diet. LOSE WEIGHT TO BMI <  30                            FOLLOWING RECOMMEDNDATION FROM "10 DAY DETOX DIET".                           - Continue present medications.                           - Await pathology results.                           - Repeat colonoscopy in 5 years for surveillance.                           - Return to GI office in 1 month. Procedure Code(s):        --- Professional ---                           [phone], Colonoscopy, flexible; with removal of                            tumor(s), polyp(s), or other lesion(s) by snare                            technique Diagnosis Code(s):        --- Professional ---                           K64.8, Other hemorrhoids                           K63.5, Polyp of colon                           R19.7, Diarrhea, unspecified                           Z86.010, Personal history of colonic polyps                           K57.30, Diverticulosis of large intestine without                            perforation or abscess without bleeding                           Q43.8, Other specified congenital malformations of                            intestine CPT copyright 2019 [employer]. All rights  reserved. The codes documented in this report are preliminary and upon coder review may  be revised to meet current compliance requirements. Barney Drain, MD Barney Drain MD, MD 05/28/2019 10:15:43 AM This report has been signed electronically. Number of Addenda: 1 Addendum Number: 1   Addendum Date: 06/17/2019 9:39:40 AM      RANDOM COLD FORCEP BIOPSIES OBTAINED TO EVALUATE FOR MICROSCOPIC  COLITIS. Barney Drain, MD Barney Drain MD, MD 06/17/2019 9:40:19 AM This report has been signed electronically.

## 2019-05-28 NOTE — Transfer of Care (Signed)
Immediate Anesthesia Transfer of Care Note  Patient: BLAISE PALLADINO  Procedure(s) Performed: COLONOSCOPY WITH PROPOFOL (N/A ) ESOPHAGOGASTRODUODENOSCOPY (EGD) WITH PROPOFOL (N/A ) SAVORY DILATION (N/A ) BIOPSY POLYPECTOMY  Patient Location: PACU  Anesthesia Type:General  Level of Consciousness: awake  Airway & Oxygen Therapy: Patient Spontanous Breathing  Post-op Assessment: Report given to RN  Post vital signs: Reviewed  Last Vitals:  Vitals Value Taken Time  BP 121/80 05/28/19 1005  Temp    Pulse 80 05/28/19 1007  Resp 16 05/28/19 1007  SpO2 95 % 05/28/19 1007  Vitals shown include unvalidated device data.  Last Pain:  Vitals:   05/28/19 0914  TempSrc:   PainSc: 0-No pain      Patients Stated Pain Goal: 6 (79/02/40 9735)  Complications: No apparent anesthesia complications

## 2019-05-29 LAB — SURGICAL PATHOLOGY

## 2019-05-30 ENCOUNTER — Encounter (HOSPITAL_COMMUNITY): Payer: Self-pay | Admitting: Gastroenterology

## 2019-06-01 ENCOUNTER — Other Ambulatory Visit: Payer: 59

## 2019-06-01 ENCOUNTER — Encounter: Payer: Self-pay | Admitting: *Deleted

## 2019-06-01 ENCOUNTER — Other Ambulatory Visit: Payer: Self-pay | Admitting: *Deleted

## 2019-06-01 ENCOUNTER — Telehealth: Payer: Self-pay | Admitting: *Deleted

## 2019-06-01 ENCOUNTER — Other Ambulatory Visit: Payer: Self-pay

## 2019-06-01 DIAGNOSIS — K922 Gastrointestinal hemorrhage, unspecified: Secondary | ICD-10-CM

## 2019-06-01 DIAGNOSIS — D509 Iron deficiency anemia, unspecified: Secondary | ICD-10-CM

## 2019-06-01 NOTE — Addendum Note (Signed)
Addended by: Cheron Every on: 06/01/2019 02:40 PM   Modules accepted: Orders

## 2019-06-01 NOTE — Telephone Encounter (Signed)
Wants you to call her when you get a chance. [phone]

## 2019-06-01 NOTE — Telephone Encounter (Signed)
See result note. Patient scheduled for givens capsule study 11/16 at 7:30am. FYI to [facility]

## 2019-06-02 ENCOUNTER — Ambulatory Visit: Payer: 59 | Admitting: Endocrinology

## 2019-06-03 ENCOUNTER — Ambulatory Visit (INDEPENDENT_AMBULATORY_CARE_PROVIDER_SITE_OTHER): Payer: Medicare Other | Admitting: Endocrinology

## 2019-06-03 ENCOUNTER — Other Ambulatory Visit: Payer: Self-pay

## 2019-06-03 ENCOUNTER — Encounter: Payer: Self-pay | Admitting: Endocrinology

## 2019-06-03 VITALS — BP 112/58 | HR 92 | Ht 66.5 in | Wt 257.4 lb

## 2019-06-03 DIAGNOSIS — E119 Type 2 diabetes mellitus without complications: Secondary | ICD-10-CM | POA: Diagnosis not present

## 2019-06-03 DIAGNOSIS — Z794 Long term (current) use of insulin: Secondary | ICD-10-CM

## 2019-06-03 LAB — POCT GLYCOSYLATED HEMOGLOBIN (HGB A1C): Hemoglobin A1C: 6.7 % — AB (ref 4.0–5.6)

## 2019-06-03 NOTE — Progress Notes (Signed)
Subjective:    Patient ID: [name], female    DOB: 02-Sep-1955, 63 y.o.   MRN: [id number]  HPI Pt returns for f/u of diabetes mellitus: DM type: Insulin-requiring type 2.   Dx'ed: 1779 Complications: none Therapy: insulin since soon after dx, 3 oral meds, and trulicity.  GDM: never (G0) DKA: never Severe hypoglycemia: never.   Pancreatitis: never.  Other: she stopped victoza, due to lack of effect; she uses V-GO-40; metformin dosage is limited by nausea.   Interval history: no recent steroids.  she brings her meter with her cbg's which I have reviewed today. cbg's vary from 107-194.  There is no trend throughout the day.  She takes a total of approx 18 clicks per day, via the V-GO-40.  Past Medical History:  Diagnosis Date  . Adenocarcinoma of right lung, stage 1 ([place]) 11/29/2016  . Cancer ([place])   . Chronic pain   . Depression    takes Lexapro daily  . Diabetes mellitus    takes Metformin daily and has an insulin pump.Fasting blood sugar runs250  . GERD (gastroesophageal reflux disease)    takes Pantoprazole daily  . History of colon polyps    benign  . Hyperlipidemia    takes Atorvastatin daily  . Hypertension    takes Amlodipine and Lisinopril daily  . Insomnia    takes Trazodone nightly  . Nocturia   . Thyroid disease   . Urinary frequency   . Urinary urgency     Past Surgical History:  Procedure Laterality Date  . ABDOMINAL HYSTERECTOMY    . ABDOMINAL SURGERY    . COLONOSCOPY  2005 MAC   TICs, IH  . COLONOSCOPY N/A 03/08/2014   Procedure: COLONOSCOPY;  Surgeon: Danie Binder, MD;  [facility];  Service: Endoscopy;  Laterality: N/A;  1015  . CYSTOSTOMY  11/22/2011   Procedure: CYSTOSTOMY SUPRAPUBIC;  Surgeon: Marissa Nestle, MD;  Location: [facility];  Service: Urology;  Laterality: N/A;  . ESOPHAGOGASTRODUODENOSCOPY N/A 03/08/2014   Procedure: ESOPHAGOGASTRODUODENOSCOPY (EGD);  Surgeon: Danie Binder, MD;  [facility];  Service:  Endoscopy;  Laterality: N/A;  . ESOPHAGOGASTRODUODENOSCOPY    . HALLUX VALGUS CORRECTION     bilateral foot surgery for bone repairs-multiple  . LOBECTOMY Right 10/15/2016   Procedure: RIGHT UPPER LOBECTOMY;  Surgeon: [name], MD;  Location: [place];  Service: Thoracic;  Laterality: Right;  . LYMPH NODE DISSECTION Right 10/15/2016   Procedure: LYMPH NODE DISSECTION;  Surgeon: [name], MD;  Location: [place];  Service: Thoracic;  Laterality: Right;  Marland Kitchen VAGINA RECONSTRUCTION SURGERY     tvt 2006- [place]  . VIDEO ASSISTED THORACOSCOPY (VATS)/WEDGE RESECTION Right 10/15/2016   Procedure: RIGHT VIDEO ASSISTED THORACOSCOPY (VATS)/WEDGE RESECTION;  Surgeon: [name], MD;  Location: [place];  Service: Thoracic;  Laterality: Right;    Social History   Socioeconomic History  . Marital status: Single    Spouse name: Not on file  . Number of children: Not on file  . Years of education: Not on file  . Highest education level: Not on file  Occupational History  . Not on file  Social Needs  . Financial resource strain: Not on file  . Food insecurity    Worry: Not on file    Inability: Not on file  . Transportation needs    Medical: Not on file    Non-medical: Not on file  Tobacco Use  . Smoking status:  Former Smoker    Packs/day: 1.00    Years: 32.00    Pack years: 32.00    Types: Cigarettes  . Smokeless tobacco: Never Used  . Tobacco comment: quit smoking 15 yrs ago  Substance and Sexual Activity  . Alcohol use: No    Comment: rarely  . Drug use: No  . Sexual activity: Not Currently    Birth control/protection: Surgical  Lifestyle  . Physical activity    Days per week: Not on file    Minutes per session: Not on file  . Stress: Not on file  Relationships  . Social Herbalist on phone: Not on file    Gets together: Not on file    Attends religious service: Not on file    Active member of club or organization: Not on file     Attends meetings of clubs or organizations: Not on file    Relationship status: Not on file  . Intimate partner violence    Fear of current or ex partner: Not on file    Emotionally abused: Not on file    Physically abused: Not on file    Forced sexual activity: Not on file  Other Topics Concern  . Not on file  Social History Narrative  . Not on file    Current Outpatient Medications on File Prior to Visit  Medication Sig Dispense Refill  . ACCU-CHEK SOFTCLIX LANCETS lancets Use to monitor glucose levels BID; E11.9 100 each 12  . acetaminophen (TYLENOL) 500 MG tablet Take 1 tablet (500 mg total) by mouth every 6 (six) hours as needed. (Patient taking differently: Take 500 mg by mouth every 6 (six) hours as needed (for pain). ) 30 tablet 0  . amLODipine (NORVASC) 5 MG tablet Take 5 mg by mouth daily.     Marland Kitchen atorvastatin (LIPITOR) 20 MG tablet Take 20 mg by mouth daily.  11  . Blood Glucose Calibration (GLUCOSE CONTROL) SOLN 1 Bottle by In Vitro route as needed. Use to calibrate Accu-Chek Aviva Plus device prn; please provide control solution compatible with Accu-Chek Aviva Plus device. 1 each 1  . Blood Glucose Monitoring Suppl (ACCU-CHEK AVIVA PLUS) w/Device KIT 1 each by Does not apply route 2 (two) times daily. Use to monitor glucose levels BID; E11.9 1 kit 0  . bromocriptine (PARLODEL) 2.5 MG tablet Take 1 tablet (2.5 mg total) by mouth daily. 30 tablet 11  . chlorthalidone (HYGROTON) 25 MG tablet Take 12.5 mg by mouth daily.     . Dulaglutide 1.5 MG/0.5ML SOPN INJECT 1.5 MG INTO THE SKIN ONCE A WEEK. (Patient taking differently: Inject 1.5 mg into the skin every Saturday. ) 2 pen 35  . escitalopram (LEXAPRO) 10 MG tablet Take 1 tablet (10 mg total) by mouth every morning. 90 tablet 3  . FARXIGA 10 MG TABS tablet TAKE 1 TABLET BY MOUTH EVERY DAY (Patient taking differently: Take 10 mg by mouth daily. ) 30 tablet 11  . glucose blood (ACCU-CHEK AVIVA PLUS) test strip Use to monitor glucose  levels BID; E11.9 100 each 12  . insulin aspart (NOVOLOG) 100 UNIT/ML injection For use in pump, total of 70 units per day 3 vial PRN  . Insulin Disposable Pump (V-GO 40) KIT 1 Device by Does not apply route daily. For use with insulin; E11.9 90 kit 2  . lisinopril (PRINIVIL,ZESTRIL) 40 MG tablet Take 40 mg by mouth daily.    . metFORMIN (GLUCOPHAGE-XR) 500 MG 24 hr tablet TAKE  1 TABLET BY MOUTH 2 TIMES DAILY. (Patient taking differently: Take 500 mg by mouth daily. ) 60 tablet 11  . pantoprazole (PROTONIX) 40 MG tablet Take 40 mg by mouth daily.    . traZODone (DESYREL) 100 MG tablet Take 2 tablets (200 mg total) by mouth at bedtime. 180 tablet 3  . YUVAFEM 10 MCG TABS vaginal tablet Place 10 mcg vaginally See admin instructions. Twice a week as needed for vaginal dryness  11   No current facility-administered medications on file prior to visit.     Allergies  Allergen Reactions  . Synthroid [Levothyroxine Sodium] Anaphylaxis  . Iodine Hives  . Povidone-Iodine Itching and Rash    Family History  Problem Relation Age of Onset  . Asthma Other   . Diabetes Other   . Arthritis Other   . Cancer Mother   . Cancer Brother   . Anesthesia problems Neg Hx   . Hypotension Neg Hx   . Malignant hyperthermia Neg Hx   . Pseudochol deficiency Neg Hx   . Colon polyps Neg Hx   . Colon cancer Neg Hx   . Celiac disease Neg Hx   . Pancreatic cancer Neg Hx   . Stomach cancer Neg Hx   . Ulcerative colitis Neg Hx   . Crohn's disease Neg Hx     BP (!) 112/58 (BP Location: Left Arm, Patient Position: Sitting, Cuff Size: Large)   Pulse 92   Ht 5' 6.5" (1.689 m)   Wt 257 lb 6.4 oz (116.8 kg)   SpO2 94%   BMI 40.92 kg/m    Review of Systems She denies hypoglycemia.      Objective:   Physical Exam VITAL SIGNS:  See vs page GENERAL: no distress Pulses: dorsalis pedis intact bilat.   MSK: no deformity of the feet CV: trace bilat leg edema.  Skin:  no ulcer on the feet.  normal color and  temp on the feet.  Neuro: sensation is intact to touch on the feet.    A1c=6.7%     Assessment & Plan:  Insulin-requiring type 2 DM: well-controlled Edema: This limits rx options   Patient Instructions  please continue the same diabetes medications.  check your blood sugar twice a day.  vary the time of day when you check, between before the 3 meals, and at bedtime.  also check if you have symptoms of your blood sugar being too high or too low.  please keep a record of the readings and bring it to your next appointment here (or you can bring the meter itself).  You can write it on any piece of paper.  please call us sooner if your blood sugar goes below 70, or if you have a lot of readings over 200.   Please come back for a follow-up appointment in 4 months.

## 2019-06-03 NOTE — Patient Instructions (Addendum)
please continue the same diabetes medications.  check your blood sugar twice a day.  vary the time of day when you check, between before the 3 meals, and at bedtime.  also check if you have symptoms of your blood sugar being too high or too low.  please keep a record of the readings and bring it to your next appointment here (or you can bring the meter itself).  You can write it on any piece of paper.  please call us sooner if your blood sugar goes below 70, or if you have a lot of readings over 200.   Please come back for a follow-up appointment in 4 months.

## 2019-06-04 ENCOUNTER — Encounter (HOSPITAL_COMMUNITY)
Admission: RE | Admit: 2019-06-04 | Discharge: 2019-06-04 | Disposition: A | Discharge status: Home / Self Care | Payer: Medicare Other | Source: Ambulatory Visit | Attending: Gastroenterology | Admitting: Gastroenterology

## 2019-06-04 ENCOUNTER — Encounter (HOSPITAL_COMMUNITY): Payer: Self-pay

## 2019-06-04 DIAGNOSIS — R1110 Vomiting, unspecified: Secondary | ICD-10-CM

## 2019-06-04 DIAGNOSIS — R1011 Right upper quadrant pain: Secondary | ICD-10-CM | POA: Diagnosis present

## 2019-06-04 MED ORDER — TECHNETIUM TC 99M MEBROFENIN IV KIT
5.0000 | PACK | Freq: Once | INTRAVENOUS | Status: AC | PRN
  Administered 2019-06-04: 5.3 via INTRAVENOUS

## 2019-06-09 ENCOUNTER — Ambulatory Visit: Payer: 59 | Admitting: Nurse Practitioner

## 2019-06-13 NOTE — Telephone Encounter (Signed)
Spoke with PT. NO QUESTIONS. PT COMING FOR GIVENS NOV 16.

## 2019-06-15 ENCOUNTER — Encounter (HOSPITAL_COMMUNITY)
Admission: RE | Disposition: A | Discharge status: Home / Self Care | Payer: Self-pay | Source: Home / Self Care | Attending: Gastroenterology

## 2019-06-15 ENCOUNTER — Ambulatory Visit (HOSPITAL_COMMUNITY)
Admission: RE | Admit: 2019-06-15 | Discharge: 2019-06-15 | Disposition: A | Discharge status: Home / Self Care | Payer: Medicare Other | Attending: Gastroenterology | Admitting: Gastroenterology

## 2019-06-15 DIAGNOSIS — K2980 Duodenitis without bleeding: Secondary | ICD-10-CM | POA: Diagnosis not present

## 2019-06-15 DIAGNOSIS — D509 Iron deficiency anemia, unspecified: Secondary | ICD-10-CM

## 2019-06-15 DIAGNOSIS — K2981 Duodenitis with bleeding: Secondary | ICD-10-CM

## 2019-06-15 HISTORY — PX: GIVENS CAPSULE STUDY: SHX5432

## 2019-06-15 SURGERY — IMAGING PROCEDURE, GI TRACT, INTRALUMINAL, VIA CAPSULE

## 2019-06-15 NOTE — Progress Notes (Signed)
CC'D TO PCP °

## 2019-06-18 ENCOUNTER — Encounter (HOSPITAL_COMMUNITY): Payer: Self-pay | Admitting: Gastroenterology

## 2019-06-23 ENCOUNTER — Telehealth: Payer: Self-pay | Admitting: Gastroenterology

## 2019-06-23 NOTE — Procedures (Signed)
INDICATION: MICROCYTIC ANEMIA. PT GIVES BLOOD(POWER RED).Marland Kitchen  PATIENT DATA: GASTRIC PASSAGE TIME: 9 m, SB PASSAGE TIME: 3H 41m  RESULTS: LIMITED views of gastric mucosa due to retained contents.  MILD DUODENITIS. No ULCERS, masses or AVMs seen.  LIMITED VIEWS OF THE COLON DUE TO RETAINED CONTENTS. No old blood or fresh blood in the stomach, small bowel, or colon.  DIAGNOSIS: MILD DUODENITIS  Plan: 1. RECHECK CBC/FERRITIN IN 3 MOS. 2. SEE HEMATOLOGY IF ANEMIA PERSISTS. 3. OPV IN 4 MOS

## 2019-06-23 NOTE — Telephone Encounter (Signed)
Called patient TO DISCUSS RESULTS GIVENS SHOWS MILD DUODENITIS.  Plan: 1. RECHECK CBC/FERRITIN IN 3 MOS. 2. SEE HEMATOLOGY IF ANEMIA PERSISTS. 3. OPV IN 4 MOS

## 2019-06-24 ENCOUNTER — Ambulatory Visit (INDEPENDENT_AMBULATORY_CARE_PROVIDER_SITE_OTHER): Payer: Medicare Other | Admitting: Psychiatry

## 2019-06-24 ENCOUNTER — Other Ambulatory Visit: Payer: Self-pay

## 2019-06-24 ENCOUNTER — Encounter (HOSPITAL_COMMUNITY): Payer: Self-pay | Admitting: Psychiatry

## 2019-06-24 DIAGNOSIS — F331 Major depressive disorder, recurrent, moderate: Secondary | ICD-10-CM

## 2019-06-24 MED ORDER — TRAZODONE HCL 100 MG PO TABS: 200.0000 mg | ORAL_TABLET | Freq: Every day | ORAL | 3 refills | Status: DC

## 2019-06-24 MED ORDER — ESCITALOPRAM OXALATE 10 MG PO TABS: 10.0000 mg | ORAL_TABLET | Freq: Every morning | ORAL | 3 refills | Status: DC
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
## 2019-06-30 ENCOUNTER — Other Ambulatory Visit: Payer: Self-pay

## 2019-06-30 DIAGNOSIS — D649 Anemia, unspecified: Secondary | ICD-10-CM

## 2019-06-30 NOTE — Telephone Encounter (Signed)
Lab orders on file for 3 months.

## 2019-06-30 NOTE — Telephone Encounter (Signed)
OV in Dec and reminder to follow up in 4 months in epic

## 2019-07-15 ENCOUNTER — Ambulatory Visit (INDEPENDENT_AMBULATORY_CARE_PROVIDER_SITE_OTHER): Payer: Medicare Other | Admitting: Gastroenterology

## 2019-07-15 ENCOUNTER — Other Ambulatory Visit: Payer: Self-pay

## 2019-07-15 ENCOUNTER — Encounter: Payer: Self-pay | Admitting: Gastroenterology

## 2019-07-15 DIAGNOSIS — R1319 Other dysphagia: Secondary | ICD-10-CM

## 2019-07-15 DIAGNOSIS — Z6839 Body mass index (BMI) 39.0-39.9, adult: Secondary | ICD-10-CM | POA: Diagnosis not present

## 2019-07-15 DIAGNOSIS — E669 Obesity, unspecified: Secondary | ICD-10-CM | POA: Insufficient documentation

## 2019-07-15 DIAGNOSIS — R1310 Dysphagia, unspecified: Secondary | ICD-10-CM

## 2019-07-15 DIAGNOSIS — D509 Iron deficiency anemia, unspecified: Secondary | ICD-10-CM | POA: Diagnosis not present

## 2019-07-15 NOTE — Assessment & Plan Note (Signed)
SYMPTOMS FAIRLY WELL CONTROLLED.  CONTINUE TO MONITOR SYMPTOMS. FOLLOW UP IN 6 MOS.  

## 2019-07-15 NOTE — Progress Notes (Signed)
ON RECALL  °

## 2019-07-15 NOTE — Progress Notes (Signed)
Subjective:    Patient ID: [name], female    DOB: 07/25/1956, 63 y.o.   MRN: [id number]  Lujean Amel, MD   HPI FELT BLOATED AND ASSOCIATED DURING NAUSEA. HAPPENED IN THE EVENING. CAN'T REMEMBER DRINKING ANYTHING FOR TRIGGERS OR WHAT SHE ATE FOR LUNCH. DRANK WATER, ICE TEA(PINK STUFF). HAVEN'T GONE TOTAL 10 DAY DETOX. IRON CONSTIPATES HER. THOUGHT MAY BE CAUSING BLOATING.  PT DENIES FEVER, CHILLS, HEMATOCHEZIA, HEMATEMESIS, melena, diarrhea, CHEST PAIN, SHORTNESS OF BREATH, CHANGE IN BOWEL IN HABITS, abdominal pain, problems swallowing, problems with sedation, OR heartburn or indigestion.  Past Medical History:  Diagnosis Date  . Adenocarcinoma of right lung, stage 1 ([place]) 11/29/2016  . Cancer ([place])   . Chronic pain   . Depression    takes Lexapro daily  . Diabetes mellitus    takes Metformin daily and has an insulin pump.Fasting blood sugar runs250  . GERD (gastroesophageal reflux disease)    takes Pantoprazole daily  . History of colon polyps    benign  . Hyperlipidemia    takes Atorvastatin daily  . Hypertension    takes Amlodipine and Lisinopril daily  . Insomnia    takes Trazodone nightly  . Nocturia   . Thyroid disease   . Urinary frequency   . Urinary urgency     Past Surgical History:  Procedure Laterality Date  . ABDOMINAL HYSTERECTOMY    . ABDOMINAL SURGERY    . BIOPSY  05/28/2019   Procedure: BIOPSY;  Surgeon: Danie Binder, MD;  [facility];  Service: Endoscopy;;  random colon/gastric/duodenum  . COLONOSCOPY  2005 MAC   TICs, IH  . COLONOSCOPY N/A 03/08/2014   Procedure: COLONOSCOPY;  Surgeon: Danie Binder, MD;  [facility];  Service: Endoscopy;  Laterality: N/A;  1015  . COLONOSCOPY WITH PROPOFOL N/A 05/28/2019   Procedure: COLONOSCOPY WITH PROPOFOL;  Surgeon: Danie Binder, MD;  [facility];  Service: Endoscopy;  Laterality: N/A;  9:15am  . CYSTOSTOMY  11/22/2011   Procedure: CYSTOSTOMY SUPRAPUBIC;   Surgeon: Marissa Nestle, MD;  [facility];  Service: Urology;  Laterality: N/A;  . ESOPHAGOGASTRODUODENOSCOPY N/A 03/08/2014   Procedure: ESOPHAGOGASTRODUODENOSCOPY (EGD);  Surgeon: Danie Binder, MD;  [facility];  Service: Endoscopy;  Laterality: N/A;  . ESOPHAGOGASTRODUODENOSCOPY    . ESOPHAGOGASTRODUODENOSCOPY (EGD) WITH PROPOFOL N/A 05/28/2019   Procedure: ESOPHAGOGASTRODUODENOSCOPY (EGD) WITH PROPOFOL;  Surgeon: Danie Binder, MD;  [facility];  Service: Endoscopy;  Laterality: N/A;  . GIVENS CAPSULE STUDY N/A 06/15/2019   Procedure: GIVENS CAPSULE STUDY;  Surgeon: Danie Binder, MD;  [facility];  Service: Endoscopy;  Laterality: N/A;  7:30am  . HALLUX VALGUS CORRECTION     bilateral foot surgery for bone repairs-multiple  . LOBECTOMY Right 10/15/2016   Procedure: RIGHT UPPER LOBECTOMY;  Surgeon: [name], MD;  Location: [place];  Service: Thoracic;  Laterality: Right;  . LYMPH NODE DISSECTION Right 10/15/2016   Procedure: LYMPH NODE DISSECTION;  Surgeon: [name], MD;  Location: [facility];  Service: Thoracic;  Laterality: Right;  . POLYPECTOMY  05/28/2019   Procedure: POLYPECTOMY;  Surgeon: Danie Binder, MD;  [facility];  Service: Endoscopy;;  . SAVORY DILATION N/A 05/28/2019   Procedure: SAVORY DILATION;  Surgeon: Danie Binder, MD;  [facility];  Service: Endoscopy;  Laterality: N/A;  15/16/17  . VAGINA RECONSTRUCTION SURGERY     tvt 2006-  [place]  . VIDEO ASSISTED THORACOSCOPY (VATS)/WEDGE RESECTION Right 10/15/2016   Procedure: RIGHT VIDEO ASSISTED THORACOSCOPY (VATS)/WEDGE RESECTION;  Surgeon: [name], MD;  Location: [facility];  Service: Thoracic;  Laterality: Right;   Allergies  Allergen Reactions  . Synthroid [Levothyroxine Sodium] Anaphylaxis  . Iodine Hives  . Povidone-Iodine Itching and Rash   Current Outpatient Medications  Medication Sig    . ACCU-CHEK  SOFTCLIX LANCETS lancets Use to monitor glucose levels BID; E11.9    . acetaminophen (TYLENOL) 500 MG tablet Take 1 tablet (500 mg total) by mouth every 6 (six) hours as needed. (Patient taking differently: Take 500 mg by mouth every 6 (six) hours as needed (for pain). )    . amLODipine (NORVASC) 5 MG tablet Take 5 mg by mouth daily.     Marland Kitchen atorvastatin (LIPITOR) 20 MG tablet Take 20 mg by mouth daily.    . Blood Glucose Calibration (GLUCOSE CONTROL) SOLN 1 Bottle by In Vitro route as needed. Use to calibrate Accu-Chek Aviva Plus device prn; please provide control solution compatible with Accu-Chek Aviva Plus device.    . Blood Glucose Monitoring Suppl (ACCU-CHEK AVIVA PLUS) w/Device KIT 1 each by Does not apply route 2 (two) times daily. Use to monitor glucose levels BID; E11.9    . bromocriptine (PARLODEL) 2.5 MG tablet Take 1 tablet (2.5 mg total) by mouth daily.    . chlorthalidone (HYGROTON) 25 MG tablet Take 12.5 mg by mouth daily.     . Dulaglutide 1.5 MG/0.5ML SOPN INJECT 1.5 MG INTO THE SKIN ONCE A WEEK. (Patient taking differently: Inject 1.5 mg into the skin every Saturday. )    . escitalopram (LEXAPRO) 10 MG tablet Take 1 tablet (10 mg total) by mouth every morning.    Marland Kitchen FARXIGA 10 MG TABS tablet TAKE 1 TABLET BY MOUTH EVERY DAY (Patient taking differently: Take 10 mg by mouth daily. )    . glucose blood (ACCU-CHEK AVIVA PLUS) test strip Use to monitor glucose levels BID; E11.9    . insulin aspart (NOVOLOG) 100 UNIT/ML injection For use in pump, total of 70 units per day    . Insulin Disposable Pump (V-GO 40) KIT 1 Device by Does not apply route daily. For use with insulin; E11.9    . lisinopril (PRINIVIL,ZESTRIL) 40 MG tablet Take 40 mg by mouth daily.    . metFORMIN (GLUCOPHAGE-XR) 500 MG 24 hr tablet TAKE 1 TABLET BY MOUTH 2 TIMES DAILY. (Patient taking differently: Take 500 mg by mouth daily. )    . pantoprazole (PROTONIX) 40 MG tablet Take 40 mg by mouth daily.    . traZODone  (DESYREL) 100 MG tablet Take 2 tablets (200 mg total) by mouth at bedtime.    Merril Abbe 10 MCG TABS vaginal tablet Place 10 mcg vaginally See admin instructions. Twice a week as needed for vaginal dryness     Review of Systems PER HPI OTHERWISE ALL SYSTEMS ARE NEGATIVE.    Objective:   Physical Exam Constitutional:      General: She is not in acute distress.    Appearance: Normal appearance.  HENT:     Mouth/Throat:     Comments: MASK IN PLACE Eyes:     General: No scleral icterus.    Pupils: Pupils are equal, round, and reactive to light.  Cardiovascular:     Rate and Rhythm: Normal rate and regular rhythm.     Pulses: Normal pulses.     Heart sounds: Normal heart sounds.  Pulmonary:     Effort: Pulmonary effort is normal.     Breath sounds: Normal breath sounds.  Abdominal:     General: Bowel sounds are normal.     Palpations: Abdomen is soft.     Tenderness: There is no abdominal tenderness.  Musculoskeletal:     Cervical back: Normal range of motion.     Right lower leg: No edema.     Left lower leg: No edema.  Lymphadenopathy:     Cervical: No cervical adenopathy.  Skin:    General: Skin is warm and dry.  Neurological:     Mental Status: She is alert and oriented to person, place, and time.     Comments: NO  NEW FOCAL DEFICITS  Psychiatric:        Mood and Affect: Mood normal.     Comments: NORMAL AFFECT       Assessment & Plan:

## 2019-07-15 NOTE — Assessment & Plan Note (Signed)
NO BRBPR OR MELENA.  CONTINUE IRON. CHECK CBC/FERRITIN IN 4 MOS. FOLLOW UP IN 6 MOS.

## 2019-07-15 NOTE — Patient Instructions (Addendum)
USE STEVIA, SWERVE, OR HONEY TO SWEETEN TEA.   CONTINUE YOUR WEIGHT LOSS EFFORTS.   TRY A FULL 10 DAYS DETOX DIET.  START YOGA 3-4 TIMES A WEEK.   CALL ME REGARDING IRON PILLS AND SUPPLEMENT.   CHECK IRON STORES(FERRITIN) AND BLOOD COUNT(CBC) IN APR 2021.  FOLLOW UP IN 6 MOS.

## 2019-07-15 NOTE — Assessment & Plan Note (Signed)
TRYING TO LOSE WEIGHT.  CONTINUE YOUR WEIGHT LOSS EFFORTS. USE STEVIA, SWERVE, OR HONEY TO SWEETEN TEA.  TRY A FULL 10 DAYS DETOX DIET. CALL ME REGARDING IRON PILLS AND SUPPLEMENT. FOLLOW UP IN 6 MOS.

## 2019-07-16 ENCOUNTER — Ambulatory Visit: Payer: 59 | Admitting: Nurse Practitioner

## 2019-07-30 ENCOUNTER — Ambulatory Visit
Admission: RE | Admit: 2019-07-30 | Discharge: 2019-07-30 | Disposition: A | Discharge status: Home / Self Care | Payer: Managed Care, Other (non HMO) | Source: Ambulatory Visit | Attending: Family Medicine | Admitting: Family Medicine

## 2019-07-30 ENCOUNTER — Other Ambulatory Visit: Payer: Self-pay

## 2019-07-30 DIAGNOSIS — E2839 Other primary ovarian failure: Secondary | ICD-10-CM

## 2019-08-10 ENCOUNTER — Ambulatory Visit: Payer: Medicare Other | Attending: Internal Medicine

## 2019-08-10 ENCOUNTER — Other Ambulatory Visit: Payer: Self-pay

## 2019-08-10 DIAGNOSIS — Z20822 Contact with and (suspected) exposure to covid-19: Secondary | ICD-10-CM

## 2019-08-11 LAB — NOVEL CORONAVIRUS, NAA: SARS-CoV-2, NAA: DETECTED — AB

## 2019-08-12 ENCOUNTER — Telehealth: Payer: Self-pay | Admitting: Nurse Practitioner

## 2019-08-12 ENCOUNTER — Telehealth: Payer: Self-pay

## 2019-08-12 NOTE — Telephone Encounter (Signed)
Pt. Called to further discuss her COVID test results.  Reported she felt she was exposed to [place] on 07/31/19.  Stated she had onset of symptoms on 1/5 with cough and body aches.  Stated she has not had any other ill feeling.  Agreed to quarantining guidelines.  Reported she will continue to follow all the Safe practice guidelines.  Questions answered.  Advised pt. She should get a call from the Health Dept.

## 2019-08-12 NOTE — Telephone Encounter (Signed)
Called to Discuss with patient about Covid symptoms and the use of bamlanivimab, a monoclonal antibody infusion for those with mild to moderate Covid symptoms and at a high risk of hospitalization.     Pt is qualified for this infusion at the [facility] center due to co-morbid conditions and/or a member of an at-risk group.     Unable to reach pt  

## 2019-08-30 ENCOUNTER — Other Ambulatory Visit: Payer: Self-pay | Admitting: Endocrinology

## 2019-08-30 DIAGNOSIS — E119 Type 2 diabetes mellitus without complications: Secondary | ICD-10-CM

## 2019-08-30 DIAGNOSIS — Z794 Long term (current) use of insulin: Secondary | ICD-10-CM

## 2019-09-08 ENCOUNTER — Other Ambulatory Visit: Payer: Self-pay | Admitting: Endocrinology

## 2019-09-10 ENCOUNTER — Encounter: Payer: Self-pay | Admitting: Gastroenterology

## 2019-09-18 ENCOUNTER — Other Ambulatory Visit: Payer: Self-pay | Admitting: Endocrinology

## 2019-09-18 DIAGNOSIS — E119 Type 2 diabetes mellitus without complications: Secondary | ICD-10-CM

## 2019-09-28 ENCOUNTER — Other Ambulatory Visit: Payer: Self-pay

## 2019-09-29 ENCOUNTER — Telehealth: Payer: Self-pay | Admitting: *Deleted

## 2019-09-29 ENCOUNTER — Other Ambulatory Visit: Payer: Self-pay | Admitting: *Deleted

## 2019-09-29 DIAGNOSIS — D649 Anemia, unspecified: Secondary | ICD-10-CM

## 2019-09-29 NOTE — Telephone Encounter (Signed)
Lab orders released and mailed to pt.

## 2019-09-30 ENCOUNTER — Other Ambulatory Visit: Payer: Self-pay

## 2019-09-30 ENCOUNTER — Encounter: Payer: Self-pay | Admitting: Endocrinology

## 2019-09-30 ENCOUNTER — Ambulatory Visit (INDEPENDENT_AMBULATORY_CARE_PROVIDER_SITE_OTHER): Payer: Medicare Other | Admitting: Endocrinology

## 2019-09-30 VITALS — BP 124/70 | HR 53 | Ht 67.0 in | Wt 247.0 lb

## 2019-09-30 DIAGNOSIS — E119 Type 2 diabetes mellitus without complications: Secondary | ICD-10-CM | POA: Diagnosis not present

## 2019-09-30 DIAGNOSIS — Z794 Long term (current) use of insulin: Secondary | ICD-10-CM | POA: Diagnosis not present

## 2019-09-30 LAB — POCT GLYCOSYLATED HEMOGLOBIN (HGB A1C): Hemoglobin A1C: 7.5 % — AB (ref 4.0–5.6)

## 2019-09-30 MED ORDER — V-GO 30 KIT: 1.0000 | PACK | Freq: Every day | 11 refills | Status: DC

## 2019-09-30 MED ORDER — TRULICITY 3 MG/0.5ML ~~LOC~~ SOAJ: 3.0000 mg | SUBCUTANEOUS | 11 refills | Status: DC

## 2019-09-30 NOTE — Progress Notes (Signed)
Subjective:    Patient ID: [name], female    DOB: October 26, 1955, 64 y.o.   MRN: [id number]  HPI Pt returns for f/u of diabetes mellitus: DM type: Insulin-requiring type 2.   Dx'ed: 5053 Complications: none Therapy: insulin since soon after dx, 3 oral meds, and trulicity.  GDM: never (G0) DKA: never Severe hypoglycemia: never.   Pancreatitis: never.  Other: she stopped victoza, due to lack of effect; she uses V-GO-40; metformin dosage is limited by nausea.   Interval history: She takes a total of approx 18 clicks per day, via the V-GO-40. She had coronavirus, but did not receive steroid rx.  she brings her meter with her cbg's which I have reviewed today.  cbg varies from 100-220.  There is no trend throughout the day.  Past Medical History:  Diagnosis Date  . Adenocarcinoma of right lung, stage 1 ([place]) 11/29/2016  . Cancer ([place])   . Chronic pain   . Depression    takes Lexapro daily  . Diabetes mellitus    takes Metformin daily and has an insulin pump.Fasting blood sugar runs250  . GERD (gastroesophageal reflux disease)    takes Pantoprazole daily  . History of colon polyps    benign  . Hyperlipidemia    takes Atorvastatin daily  . Hypertension    takes Amlodipine and Lisinopril daily  . Insomnia    takes Trazodone nightly  . Nocturia   . Thyroid disease   . Urinary frequency   . Urinary urgency     Past Surgical History:  Procedure Laterality Date  . ABDOMINAL HYSTERECTOMY    . ABDOMINAL SURGERY    . BIOPSY  05/28/2019   Procedure: BIOPSY;  Surgeon: Danie Binder, MD;  [facility];  Service: Endoscopy;;  random colon/gastric/duodenum  . COLONOSCOPY  2005 MAC   TICs, IH  . COLONOSCOPY N/A 03/08/2014   Procedure: COLONOSCOPY;  Surgeon: Danie Binder, MD;  [facility];  Service: Endoscopy;  Laterality: N/A;  1015  . COLONOSCOPY WITH PROPOFOL N/A 05/28/2019   Procedure: COLONOSCOPY WITH PROPOFOL;  Surgeon: Danie Binder, MD;   [facility];  Service: Endoscopy;  Laterality: N/A;  9:15am  . CYSTOSTOMY  11/22/2011   Procedure: CYSTOSTOMY SUPRAPUBIC;  Surgeon: Marissa Nestle, MD;  [facility];  Service: Urology;  Laterality: N/A;  . ESOPHAGOGASTRODUODENOSCOPY N/A 03/08/2014   Procedure: ESOPHAGOGASTRODUODENOSCOPY (EGD);  Surgeon: Danie Binder, MD;  [facility];  Service: Endoscopy;  Laterality: N/A;  . ESOPHAGOGASTRODUODENOSCOPY    . ESOPHAGOGASTRODUODENOSCOPY (EGD) WITH PROPOFOL N/A 05/28/2019   Procedure: ESOPHAGOGASTRODUODENOSCOPY (EGD) WITH PROPOFOL;  Surgeon: Danie Binder, MD;  [facility];  Service: Endoscopy;  Laterality: N/A;  . GIVENS CAPSULE STUDY N/A 06/15/2019   Procedure: GIVENS CAPSULE STUDY;  Surgeon: Danie Binder, MD;  [facility];  Service: Endoscopy;  Laterality: N/A;  7:30am  . HALLUX VALGUS CORRECTION     bilateral foot surgery for bone repairs-multiple  . LOBECTOMY Right 10/15/2016   Procedure: RIGHT UPPER LOBECTOMY;  Surgeon: [name], MD;  Location: [facility];  Service: Thoracic;  Laterality: Right;  . LYMPH NODE DISSECTION Right 10/15/2016   Procedure: LYMPH NODE DISSECTION;  Surgeon: [name], MD;  Location: [facility];  Service: Thoracic;  Laterality: Right;  . POLYPECTOMY  05/28/2019   Procedure: POLYPECTOMY;  Surgeon: Danie Binder, MD;  [facility];  Service: Endoscopy;;  . SAVORY DILATION  N/A 05/28/2019   Procedure: SAVORY DILATION;  Surgeon: Danie Binder, MD;  [facility];  Service: Endoscopy;  Laterality: N/A;  15/16/17  . VAGINA RECONSTRUCTION SURGERY     tvt 2006- [place]  . VIDEO ASSISTED THORACOSCOPY (VATS)/WEDGE RESECTION Right 10/15/2016   Procedure: RIGHT VIDEO ASSISTED THORACOSCOPY (VATS)/WEDGE RESECTION;  Surgeon: [name], MD;  Location: [facility];  Service: Thoracic;  Laterality: Right;    Social History   Socioeconomic History  . Marital status: Single     Spouse name: Not on file  . Number of children: Not on file  . Years of education: Not on file  . Highest education level: Not on file  Occupational History  . Not on file  Tobacco Use  . Smoking status: Former Smoker    Packs/day: 1.00    Years: 32.00    Pack years: 32.00    Types: Cigarettes  . Smokeless tobacco: Never Used  . Tobacco comment: quit smoking 15 yrs ago  Substance and Sexual Activity  . Alcohol use: No    Comment: rarely  . Drug use: No  . Sexual activity: Not Currently    Birth control/protection: Surgical  Other Topics Concern  . Not on file  Social History Narrative  . Not on file   Social Determinants of Health   Financial Resource Strain:   . Difficulty of Paying Living Expenses: Not on file  Food Insecurity:   . Worried About Charity fundraiser in the Last Year: Not on file  . Ran Out of Food in the Last Year: Not on file  Transportation Needs:   . Lack of Transportation (Medical): Not on file  . Lack of Transportation (Non-Medical): Not on file  Physical Activity:   . Days of Exercise per Week: Not on file  . Minutes of Exercise per Session: Not on file  Stress:   . Feeling of Stress : Not on file  Social Connections:   . Frequency of Communication with Friends and Family: Not on file  . Frequency of Social Gatherings with Friends and Family: Not on file  . Attends Religious Services: Not on file  . Active Member of Clubs or Organizations: Not on file  . Attends Archivist Meetings: Not on file  . Marital Status: Not on file  Intimate Partner Violence:   . Fear of Current or Ex-Partner: Not on file  . Emotionally Abused: Not on file  . Physically Abused: Not on file  . Sexually Abused: Not on file    Current Outpatient Medications on File Prior to Visit  Medication Sig Dispense Refill  . ACCU-CHEK AVIVA PLUS test strip USE TO MONITOR GLUCOSE LEVELS TWICE A DAY E11.9 100 strip 12  . ACCU-CHEK SOFTCLIX LANCETS lancets Use to  monitor glucose levels BID; E11.9 100 each 12  . acetaminophen (TYLENOL) 500 MG tablet Take 1 tablet (500 mg total) by mouth every 6 (six) hours as needed. (Patient taking differently: Take 500 mg by mouth every 6 (six) hours as needed (for pain). ) 30 tablet 0  . amLODipine (NORVASC) 5 MG tablet Take 5 mg by mouth daily.     Marland Kitchen atorvastatin (LIPITOR) 20 MG tablet Take 20 mg by mouth daily.  11  . Blood Glucose Calibration (GLUCOSE CONTROL) SOLN 1 Bottle by In Vitro route as needed. Use to calibrate Accu-Chek Aviva Plus device prn; please provide control solution compatible with Accu-Chek Aviva Plus device. 1 each 1  . Blood  Glucose Monitoring Suppl (ACCU-CHEK AVIVA PLUS) w/Device KIT 1 each by Does not apply route 2 (two) times daily. Use to monitor glucose levels BID; E11.9 1 kit 0  . bromocriptine (PARLODEL) 2.5 MG tablet Take 1 tablet (2.5 mg total) by mouth daily. 30 tablet 11  . chlorthalidone (HYGROTON) 25 MG tablet Take 12.5 mg by mouth daily.     Marland Kitchen escitalopram (LEXAPRO) 10 MG tablet Take 1 tablet (10 mg total) by mouth every morning. 90 tablet 3  . FARXIGA 10 MG TABS tablet TAKE 1 TABLET BY MOUTH EVERY DAY (Patient taking differently: Take 10 mg by mouth daily. ) 30 tablet 11  . insulin aspart (NOVOLOG) 100 UNIT/ML injection FOR USE IN PUMP, TOTAL OF  70 UNITS PER DAY 30 mL 8  . lisinopril (PRINIVIL,ZESTRIL) 40 MG tablet Take 40 mg by mouth daily.    . metFORMIN (GLUCOPHAGE-XR) 500 MG 24 hr tablet Take 500 mg by mouth daily with breakfast.    . pantoprazole (PROTONIX) 40 MG tablet Take 40 mg by mouth daily.    . traZODone (DESYREL) 100 MG tablet Take 2 tablets (200 mg total) by mouth at bedtime. 180 tablet 3  . YUVAFEM 10 MCG TABS vaginal tablet Place 10 mcg vaginally See admin instructions. Twice a week as needed for vaginal dryness  11   No current facility-administered medications on file prior to visit.    Allergies  Allergen Reactions  . Synthroid [Levothyroxine Sodium]  Anaphylaxis  . Iodine Hives  . Povidone-Iodine Itching and Rash    Family History  Problem Relation Age of Onset  . Asthma Other   . Diabetes Other   . Arthritis Other   . Cancer Mother   . Cancer Brother   . Anesthesia problems Neg Hx   . Hypotension Neg Hx   . Malignant hyperthermia Neg Hx   . Pseudochol deficiency Neg Hx   . Colon polyps Neg Hx   . Colon cancer Neg Hx   . Celiac disease Neg Hx   . Pancreatic cancer Neg Hx   . Stomach cancer Neg Hx   . Ulcerative colitis Neg Hx   . Crohn's disease Neg Hx     BP 124/70 (BP Location: Right Arm, Patient Position: Sitting, Cuff Size: Large)   Pulse (!) 53   Ht _0  (1.702 m)   Wt 247 lb (112 kg)   SpO2 97%   BMI 38.69 kg/m   Review of Systems She denies hypoglycemia. She has lost 10 lbs since last OV.     Objective:   Physical Exam VITAL SIGNS:  See vs page GENERAL: no distress Pulses: dorsalis pedis intact bilat.   MSK: no deformity of the feet CV: no leg edema Skin:  no ulcer on the feet.  normal color and temp on the feet. Neuro: sensation is intact to touch on the feet  Lab Results  Component Value Date   HGBA1C 7.5 (A) 09/30/2019       Assessment & Plan:  Insulin-requiring type 2 DM: worse Obesity: increasing Trulicity will help.  Patient Instructions  I have sent 2 prescriptions to your pharmacy: to double the Trulicty, and for the V-WU-98. Please continue the same other diabetes medications check your blood sugar twice a day.  vary the time of day when you check, between before the 3 meals, and at bedtime.  also check if you have symptoms of your blood sugar being too high or too low.  please keep a record of the  readings and bring it to your next appointment here (or you can bring the meter itself).  You can write it on any piece of paper.  please call us sooner if your blood sugar goes below 70, or if you have a lot of readings over 200. Please come back for a follow-up appointment in 2 months.

## 2019-09-30 NOTE — Patient Instructions (Signed)
I have sent 2 prescriptions to your pharmacy: to double the Trulicty, and for the K-DT-26. Please continue the same other diabetes medications check your blood sugar twice a day.  vary the time of day when you check, between before the 3 meals, and at bedtime.  also check if you have symptoms of your blood sugar being too high or too low.  please keep a record of the readings and bring it to your next appointment here (or you can bring the meter itself).  You can write it on any piece of paper.  please call us sooner if your blood sugar goes below 70, or if you have a lot of readings over 200. Please come back for a follow-up appointment in 2 months.

## 2019-10-09 LAB — CBC WITH DIFFERENTIAL/PLATELET
Absolute Monocytes: 449 cells/uL (ref 200–950)
Basophils Absolute: 53 cells/uL (ref 0–200)
Basophils Relative: 0.6 %
Eosinophils Absolute: 554 cells/uL — ABNORMAL HIGH (ref 15–500)
Eosinophils Relative: 6.3 %
HCT: 38.2 % (ref 35.0–45.0)
Hemoglobin: 12 g/dL (ref 11.7–15.5)
Lymphs Abs: 1892 cells/uL (ref 850–3900)
MCH: 25.2 pg — ABNORMAL LOW (ref 27.0–33.0)
MCHC: 31.4 g/dL — ABNORMAL LOW (ref 32.0–36.0)
MCV: 80.3 fL (ref 80.0–100.0)
MPV: 11.3 fL (ref 7.5–12.5)
Monocytes Relative: 5.1 %
Neutro Abs: 5852 cells/uL (ref 1500–7800)
Neutrophils Relative %: 66.5 %
Platelets: 249 10*3/uL (ref 140–400)
RBC: 4.76 10*6/uL (ref 3.80–5.10)
RDW: 14.8 % (ref 11.0–15.0)
Total Lymphocyte: 21.5 %
WBC: 8.8 10*3/uL (ref 3.8–10.8)

## 2019-10-09 LAB — FERRITIN: Ferritin: 8 ng/mL — ABNORMAL LOW (ref 16–288)

## 2019-10-11 ENCOUNTER — Other Ambulatory Visit: Payer: Self-pay

## 2019-10-11 ENCOUNTER — Encounter: Payer: Self-pay | Admitting: *Deleted

## 2019-10-11 ENCOUNTER — Ambulatory Visit
Admission: EM | Admit: 2019-10-11 | Discharge: 2019-10-11 | Disposition: A | Discharge status: Home / Self Care | Payer: Medicare Other | Attending: Emergency Medicine | Admitting: Emergency Medicine

## 2019-10-11 ENCOUNTER — Ambulatory Visit (INDEPENDENT_AMBULATORY_CARE_PROVIDER_SITE_OTHER): Payer: Medicare Other

## 2019-10-11 DIAGNOSIS — R0981 Nasal congestion: Secondary | ICD-10-CM | POA: Diagnosis not present

## 2019-10-11 DIAGNOSIS — Z8616 Personal history of COVID-19: Secondary | ICD-10-CM

## 2019-10-11 DIAGNOSIS — E119 Type 2 diabetes mellitus without complications: Secondary | ICD-10-CM

## 2019-10-11 DIAGNOSIS — I10 Essential (primary) hypertension: Secondary | ICD-10-CM

## 2019-10-11 DIAGNOSIS — R519 Headache, unspecified: Secondary | ICD-10-CM | POA: Diagnosis not present

## 2019-10-11 DIAGNOSIS — R05 Cough: Secondary | ICD-10-CM | POA: Diagnosis not present

## 2019-10-11 DIAGNOSIS — R918 Other nonspecific abnormal finding of lung field: Secondary | ICD-10-CM

## 2019-10-11 DIAGNOSIS — R0989 Other specified symptoms and signs involving the circulatory and respiratory systems: Secondary | ICD-10-CM | POA: Diagnosis not present

## 2019-10-11 DIAGNOSIS — Z87891 Personal history of nicotine dependence: Secondary | ICD-10-CM

## 2019-10-11 LAB — POCT RAPID STREP A (OFFICE): Rapid Strep A Screen: NEGATIVE

## 2019-10-11 MED ORDER — FLUTICASONE PROPIONATE 50 MCG/ACT NA SUSP: 1.0000 | Freq: Every day | NASAL | 0 refills | Status: DC

## 2019-10-11 MED ORDER — BENZONATATE 100 MG PO CAPS: 100.0000 mg | ORAL_CAPSULE | Freq: Three times a day (TID) | ORAL | 0 refills | Status: DC

## 2019-10-11 MED ORDER — CETIRIZINE HCL 10 MG PO TABS: 10.0000 mg | ORAL_TABLET | Freq: Every day | ORAL | 0 refills | Status: DC

## 2019-10-11 NOTE — Discharge Instructions (Signed)
POCT strep test was negative/was sent for culture Chest x-ray was negative Tessalon Perles prescribed for cough Flonase was prescribed Zyrtec was prescribed Advised patient to follow-up with primary care

## 2019-10-11 NOTE — ED Triage Notes (Signed)
States woke 2 days ago with severe sore throat.  Yesterday started with runny nose, HA, cough, sore throat.  No known fevers.  Has not had any meds.

## 2019-10-11 NOTE — ED Provider Notes (Signed)
[facility]    CSN: [id number] Arrival date & time: 10/11/19  7017      History   Chief Complaint Chief Complaint  Patient presents with  . Cough  . Sore Throat    HPI [name] is a 64 y.o. female.   Who presented to the urgent care for complaint of cough, sore throat, headache and runny nose for the past 2 days.  Denies sick exposure to COVID, flu or strep.  Denies recent travel.  Denies aggravating or alleviating symptoms.  Denies previous COVID infection.   Denies fever, chills, fatigue, nasal congestion, , SOB, wheezing, chest pain, nausea, vomiting, changes in bowel or bladder habits.    The history is provided by the patient. No language interpreter was used.  Cough Associated symptoms: headaches, rhinorrhea and sore throat   Sore Throat Associated symptoms include headaches.    Past Medical History:  Diagnosis Date  . Adenocarcinoma of right lung, stage 1 ([place]) 11/29/2016  . Cancer ([place])   . Chronic pain   . Depression    takes Lexapro daily  . Diabetes mellitus    takes Metformin daily and has an insulin pump.Fasting blood sugar runs250  . GERD (gastroesophageal reflux disease)    takes Pantoprazole daily  . History of colon polyps    benign  . Hyperlipidemia    takes Atorvastatin daily  . Hypertension    takes Amlodipine and Lisinopril daily  . Insomnia    takes Trazodone nightly  . Nocturia   . Thyroid disease   . Urinary frequency   . Urinary urgency     Patient Active Problem List   Diagnosis Date Noted  . Obesity 07/15/2019  . Diarrhea   . History of colonic polyps 03/09/2019  . Microcytic anemia 03/09/2019  . Dysphagia 03/09/2019  . Adenocarcinoma of right lung, stage 1 ([place]) 11/29/2016  . S/P lobectomy of lung 10/15/2016  . Hyperlipidemia 10/02/2016  . Lung nodule 10/02/2016  . Adrenal adenoma, right 10/02/2016  . Diabetes ([place]) 06/16/2016  . HTN (hypertension) 05/31/2016  . Change in bowel habits 03/03/2014  .  Depression 10/13/2013  . Fractured lateral malleolus 08/12/2012  . Left knee sprain 08/12/2012    Past Surgical History:  Procedure Laterality Date  . ABDOMINAL HYSTERECTOMY    . ABDOMINAL SURGERY    . BIOPSY  05/28/2019   Procedure: BIOPSY;  Surgeon: Danie Binder, MD;  [facility];  Service: Endoscopy;;  random colon/gastric/duodenum  . COLONOSCOPY  2005 MAC   TICs, IH  . COLONOSCOPY N/A 03/08/2014   Procedure: COLONOSCOPY;  Surgeon: Danie Binder, MD;  [facility];  Service: Endoscopy;  Laterality: N/A;  1015  . COLONOSCOPY WITH PROPOFOL N/A 05/28/2019   Procedure: COLONOSCOPY WITH PROPOFOL;  Surgeon: Danie Binder, MD;  [facility];  Service: Endoscopy;  Laterality: N/A;  9:15am  . CYSTOSTOMY  11/22/2011   Procedure: CYSTOSTOMY SUPRAPUBIC;  Surgeon: Marissa Nestle, MD;  [facility];  Service: Urology;  Laterality: N/A;  . ESOPHAGOGASTRODUODENOSCOPY N/A 03/08/2014   Procedure: ESOPHAGOGASTRODUODENOSCOPY (EGD);  Surgeon: Danie Binder, MD;  [facility];  Service: Endoscopy;  Laterality: N/A;  . ESOPHAGOGASTRODUODENOSCOPY    . ESOPHAGOGASTRODUODENOSCOPY (EGD) WITH PROPOFOL N/A 05/28/2019   Procedure: ESOPHAGOGASTRODUODENOSCOPY (EGD) WITH PROPOFOL;  Surgeon: Danie Binder, MD;  [facility];  Service: Endoscopy;  Laterality: N/A;  . GIVENS CAPSULE STUDY N/A 06/15/2019   Procedure: GIVENS CAPSULE STUDY;  Surgeon: Danie Binder, MD;  [facility];  Service: Endoscopy;  Laterality: N/A;  7:30am  . HALLUX VALGUS CORRECTION     bilateral foot surgery for bone repairs-multiple  . LOBECTOMY Right 10/15/2016   Procedure: RIGHT UPPER LOBECTOMY;  Surgeon: [name], MD;  Location: [facility];  Service: Thoracic;  Laterality: Right;  . LYMPH NODE DISSECTION Right 10/15/2016   Procedure: LYMPH NODE DISSECTION;  Surgeon: [name], MD;  Location: [facility];  Service: Thoracic;  Laterality: Right;  .  POLYPECTOMY  05/28/2019   Procedure: POLYPECTOMY;  Surgeon: Danie Binder, MD;  [facility];  Service: Endoscopy;;  . SAVORY DILATION N/A 05/28/2019   Procedure: SAVORY DILATION;  Surgeon: Danie Binder, MD;  [facility];  Service: Endoscopy;  Laterality: N/A;  15/16/17  . VAGINA RECONSTRUCTION SURGERY     tvt 2006- [place]  . VIDEO ASSISTED THORACOSCOPY (VATS)/WEDGE RESECTION Right 10/15/2016   Procedure: RIGHT VIDEO ASSISTED THORACOSCOPY (VATS)/WEDGE RESECTION;  Surgeon: [name], MD;  [facility];  Service: Thoracic;  Laterality: Right;    OB History    Gravida      Para      Term      Preterm      AB      Living  0     SAB      TAB      Ectopic      Multiple      Live Births               Home Medications    Prior to Admission medications   Medication Sig Start Date End Date Taking? Authorizing Provider  amLODipine (NORVASC) 5 MG tablet Take 5 mg by mouth daily.    Yes [provider]  atorvastatin (LIPITOR) 20 MG tablet Take 20 mg by mouth daily. 04/27/17  Yes [provider]  bromocriptine (PARLODEL) 2.5 MG tablet Take 1 tablet (2.5 mg total) by mouth daily. 01/27/19  Yes Renato Shin, MD  chlorthalidone (HYGROTON) 25 MG tablet Take 12.5 mg by mouth daily.  11/26/16  Yes [provider]  Dulaglutide (TRULICITY) 3 GG/8.3MO SOPN Inject 3 mg into the skin once a week. 09/30/19  Yes Renato Shin, MD  escitalopram (LEXAPRO) 10 MG tablet Take 1 tablet (10 mg total) by mouth every morning. 06/24/19  Yes Cloria Spring, MD  FARXIGA 10 MG TABS tablet TAKE 1 TABLET BY MOUTH EVERY DAY Patient taking differently: Take 10 mg by mouth daily.  12/04/18  Yes Renato Shin, MD  insulin aspart (NOVOLOG) 100 UNIT/ML injection FOR USE IN PUMP, TOTAL OF  70 UNITS PER DAY 08/30/19  Yes Renato Shin, MD  Insulin Disposable Pump (V-GO 30) KIT 1 Device by Does not apply route daily. 09/30/19  Yes Renato Shin,  MD  lisinopril (PRINIVIL,ZESTRIL) 40 MG tablet Take 40 mg by mouth daily.   Yes [provider]  metFORMIN (GLUCOPHAGE-XR) 500 MG 24 hr tablet Take 500 mg by mouth daily with breakfast.   Yes [provider]  pantoprazole (PROTONIX) 40 MG tablet Take 40 mg by mouth daily.   Yes [provider]  traZODone (DESYREL) 100 MG tablet Take 2 tablets (200 mg total) by mouth at bedtime. 06/24/19  Yes Cloria Spring, MD  ACCU-CHEK AVIVA PLUS test strip USE TO MONITOR GLUCOSE LEVELS TWICE A DAY E11.9 09/18/19   Renato Shin, MD  ACCU-CHEK SOFTCLIX LANCETS lancets Use to  monitor glucose levels BID; E11.9 08/21/18   Renato Shin, MD  acetaminophen (TYLENOL) 500 MG tablet Take 1 tablet (500 mg total) by mouth every 6 (six) hours as needed. Patient taking differently: Take 500 mg by mouth every 6 (six) hours as needed (for pain).  08/07/17   Horton, Barbette Hair, MD  benzonatate (TESSALON) 100 MG capsule Take 1 capsule (100 mg total) by mouth every 8 (eight) hours. 10/11/19   Takyah Ciaramitaro, Darrelyn Hillock, FNP  Blood Glucose Calibration (GLUCOSE CONTROL) SOLN 1 Bottle by In Vitro route as needed. Use to calibrate Accu-Chek Aviva Plus device prn; please provide control solution compatible with Accu-Chek Aviva Plus device. 08/21/18   Renato Shin, MD  Blood Glucose Monitoring Suppl (ACCU-CHEK AVIVA PLUS) w/Device KIT 1 each by Does not apply route 2 (two) times daily. Use to monitor glucose levels BID; E11.9 08/19/18   Renato Shin, MD  cetirizine (ZYRTEC ALLERGY) 10 MG tablet Take 1 tablet (10 mg total) by mouth daily. 10/11/19   Sharelle Burditt, Darrelyn Hillock, FNP  fluticasone (FLONASE) 50 MCG/ACT nasal spray Place 1 spray into both nostrils daily for 14 days. 10/11/19 10/25/19  Aarib Pulido, Darrelyn Hillock, FNP  YUVAFEM 10 MCG TABS vaginal tablet Place 10 mcg vaginally See admin instructions. Twice a week as needed for vaginal dryness 09/29/16   [provider]    Family History Family History  Problem Relation Age  of Onset  . Asthma Other   . Diabetes Other   . Arthritis Other   . Cancer Mother   . Cancer Brother   . Anesthesia problems Neg Hx   . Hypotension Neg Hx   . Malignant hyperthermia Neg Hx   . Pseudochol deficiency Neg Hx   . Colon polyps Neg Hx   . Colon cancer Neg Hx   . Celiac disease Neg Hx   . Pancreatic cancer Neg Hx   . Stomach cancer Neg Hx   . Ulcerative colitis Neg Hx   . Crohn's disease Neg Hx     Social History Social History   Tobacco Use  . Smoking status: Former Smoker    Packs/day: 1.00    Years: 32.00    Pack years: 32.00    Types: Cigarettes  . Smokeless tobacco: Never Used  . Tobacco comment: quit smoking 20 yrs ago  Substance Use Topics  . Alcohol use: No    Comment: rarely  . Drug use: No     Allergies   Synthroid [levothyroxine sodium], Iodine, and Povidone-iodine   Review of Systems Review of Systems  Constitutional: Negative.   HENT: Positive for rhinorrhea and sore throat.   Respiratory: Positive for cough.   Cardiovascular: Negative.   Gastrointestinal: Negative.   Neurological: Positive for headaches.  All other systems reviewed and are negative.    Physical Exam Triage Vital Signs ED Triage Vitals [10/11/19 0827]  Enc Vitals Group     BP 131/82     Pulse Rate 75     Resp 17     Temp 97.8 F (36.6 C)     Temp Source Oral     SpO2 98 %     Weight      Height      Head Circumference      Peak Flow      Pain Score      Pain Loc      Pain Edu?      Excl. in [place]?    No data found.  Updated Vital Signs BP  131/82 (BP Location: Right Arm)   Pulse 75   Temp 97.8 F (36.6 C) (Oral)   Resp 17   SpO2 98%   Visual Acuity Right Eye Distance:   Left Eye Distance:   Bilateral Distance:    Right Eye Near:   Left Eye Near:    Bilateral Near:     Physical Exam Vitals and nursing note reviewed.  Constitutional:      General: She is not in acute distress.    Appearance: Normal appearance. She is normal weight. She is  not ill-appearing or toxic-appearing.  HENT:     Head: Normocephalic.     Right Ear: Tympanic membrane, ear canal and external ear normal. There is no impacted cerumen.     Left Ear: Tympanic membrane, ear canal and external ear normal. There is no impacted cerumen.     Nose: Nose normal. No congestion.     Mouth/Throat:     Mouth: Mucous membranes are moist.     Pharynx: Oropharynx is clear. No oropharyngeal exudate or posterior oropharyngeal erythema.     Tonsils: 2+ on the right. 2+ on the left.  Cardiovascular:     Rate and Rhythm: Normal rate and regular rhythm.     Pulses: Normal pulses.     Heart sounds: Normal heart sounds. No murmur.  Pulmonary:     Effort: Pulmonary effort is normal. No respiratory distress.     Breath sounds: Normal breath sounds. No wheezing or rhonchi.  Chest:     Chest wall: No tenderness.  Abdominal:     General: Abdomen is flat. Bowel sounds are normal. There is no distension.     Palpations: There is no mass.     Tenderness: There is no abdominal tenderness.  Neurological:     Mental Status: She is alert and oriented to person, place, and time.      UC Treatments / Results  Labs (all labs ordered are listed, but only abnormal results are displayed) Labs Reviewed  POCT RAPID STREP A (OFFICE)    EKG   Radiology DG Chest 2 View  Result Date: 10/11/2019 CLINICAL DATA:  Patient c/o cough, sinus congestion, headache, recent COVID positive 08/10/19. Hx of lobectomy- rt side, lung nodules, htn, diabetes. Former smoker EXAM: CHEST - 2 VIEW COMPARISON:  02/21/2018 FINDINGS: Cardiac silhouette is normal in size. No mediastinal or hilar masses. No evidence of adenopathy. Previous right upper lobectomy. Lungs are hyperexpanded but clear. No pleural effusion or pneumothorax. Skeletal structures are intact. IMPRESSION: 1. No acute cardiopulmonary disease. Electronically Signed   By: Lajean Manes M.D.   On: 10/11/2019 09:04    Procedures Procedures  (including critical care time)  Medications Ordered in UC Medications - No data to display  Initial Impression / Assessment and Plan / UC Course  I have reviewed the triage vital signs and the nursing notes.  Pertinent labs & imaging results that were available during my care of the patient were reviewed by me and considered in my medical decision making (see chart for details).     X-ray is negative for cardiopulmonary disease.  I have reviewed the x-ray myself and the radiologist interpretation.  I am in agreement with the radiologist interpretation.  Patient stable for discharge. POCT strep test was negative Tessalon Perles prescribed for cough Flonase was prescribed Zyrtec was prescribed Advised patient to follow-up with primary care Return for worsening symptoms   Final Clinical Impressions(s) / UC Diagnoses   Final diagnoses:  Upper  respiratory symptom     Discharge Instructions     POCT strep test was negative/was sent for culture Chest x-ray was negative Tessalon Perles prescribed for cough Flonase was prescribed Zyrtec was prescribed Advised patient to follow-up with primary care    ED Prescriptions    Medication Sig Dispense Auth. Provider   fluticasone (FLONASE) 50 MCG/ACT nasal spray Place 1 spray into both nostrils daily for 14 days. 16 g Aynsley Fleet S, FNP   benzonatate (TESSALON) 100 MG capsule Take 1 capsule (100 mg total) by mouth every 8 (eight) hours. 30 capsule Leannah Guse, Darrelyn Hillock, FNP   cetirizine (ZYRTEC ALLERGY) 10 MG tablet Take 1 tablet (10 mg total) by mouth daily. 30 tablet Mihira Tozzi, Darrelyn Hillock, FNP     PDMP not reviewed this encounter.   Emerson Monte, [place] 10/11/19 [phone]

## 2019-11-10 ENCOUNTER — Telehealth: Payer: Self-pay | Admitting: Gastroenterology

## 2019-11-10 DIAGNOSIS — R112 Nausea with vomiting, unspecified: Secondary | ICD-10-CM

## 2019-11-10 MED ORDER — PROMETHAZINE HCL 12.5 MG PO TABS: ORAL_TABLET | ORAL | 1 refills | Status: DC

## 2019-11-10 NOTE — Telephone Encounter (Signed)
Called pt verified name and dob Pt stated she will pick up the [facility] and states her blood sugars have been running very good. They have been running about 120. She stated the only new meds that she has taken is the gavious with magnesium and imatron other than that nothing new. She stated she will follow up with pcp about possible uti.

## 2019-11-10 NOTE — Addendum Note (Signed)
Addended by: Danie Binder on: 11/10/2019 04:38 PM   Modules accepted: Orders

## 2019-11-10 NOTE — Telephone Encounter (Signed)
REVIEWED-NO ADDITIONAL RECOMMENDATIONS. 

## 2019-11-10 NOTE — Telephone Encounter (Signed)
[place] PATIENT, STILL HAVING NAUSEA

## 2019-11-10 NOTE — Telephone Encounter (Signed)
PLEASE CALL PT. SHE SHOULD SEE HER PCP TO BE CHECKED FOR A UTI. SHE COULD BE HAVING A GERD FLARE. HAS SHE STARTED ANY NEW MEDICINES? WHAT ARE HER BLOOD SUGARS RUNNING? Rx FOR PHENERGAN SENT [facility]

## 2019-11-10 NOTE — Telephone Encounter (Signed)
Called pt verified name and dob pt stated she is so nauseous and has been for several days. Pt states she doesn't think its her gastritis or gallbladder but she can not figure out what is triggering it. She states she is taking gavioucon 254mg  and Magnalum and Imatron 17ml took them both last night. Pt stated she walked around with a bucket because she felt like she was going to vomit, but she has not vomited she is just burping and doesn't know if it is just acid reflux and this has been going on for several months denies fever or anything else. Pt last bm was today and it was normal. She states she is not having any issues with that pt did have covid in jan 2021 pt is not taking anything for nausea like Zofran or phenergan and she is not having an appetite. Pt states if you send her in any medication to [facility] [address]

## 2019-11-19 MED ORDER — PANTOPRAZOLE SODIUM 40 MG PO TBEC: DELAYED_RELEASE_TABLET | ORAL | 11 refills | Status: DC

## 2019-11-19 NOTE — Telephone Encounter (Addendum)
Called patient TO DISCUSS CONCERNS. HAVING GOOD DAY AND BAD DAYS. THROWING UP OUT OF THE BLUES. COMES AND GOES. RANDOM ONSET. TRYING TO LOSE WEIGHT. BURPS TASTE LIKE VOMIT. BEEN DIABETIC FOR 8 YEARS. HgA1C USUALLY 6.8. NO WARNING. HIDA NL WITHIN LAST 2 YRS.   PLAN: 1. PROTONIX BID 2. PHENERGAN PRN 3. COMPLETE GASTRIC EMPTYING STUDY ASAP, Dx: NAUSEA/VOMITING 4. FOLLOW GASTROPARESIS DIET. 5. OPV IN JUN 2021, Dx: NAUSEA/VOMTING WITH ERIC GILL/ROURK

## 2019-11-19 NOTE — Addendum Note (Signed)
Addended by: Danie Binder on: 11/19/2019 03:50 PM   Modules accepted: Orders

## 2019-11-20 ENCOUNTER — Encounter: Payer: Self-pay | Admitting: Gastroenterology

## 2019-11-20 NOTE — Telephone Encounter (Signed)
[place]  [facility] scheduled for 5/3 at 8:00am, arrival 7:45am, npo midnight, no stomach medications that morning. Letter mailed

## 2019-11-20 NOTE — Telephone Encounter (Signed)
Noted. Will see in the office

## 2019-11-25 ENCOUNTER — Other Ambulatory Visit: Payer: Self-pay | Admitting: Endocrinology

## 2019-11-30 ENCOUNTER — Other Ambulatory Visit: Payer: Self-pay

## 2019-11-30 ENCOUNTER — Encounter (HOSPITAL_COMMUNITY)
Admission: RE | Admit: 2019-11-30 | Discharge: 2019-11-30 | Disposition: A | Discharge status: Home / Self Care | Payer: Medicare Other | Source: Ambulatory Visit | Attending: Gastroenterology | Admitting: Gastroenterology

## 2019-11-30 DIAGNOSIS — R112 Nausea with vomiting, unspecified: Secondary | ICD-10-CM | POA: Diagnosis not present

## 2019-11-30 MED ORDER — TECHNETIUM TC 99M SULFUR COLLOID
2.0000 | Freq: Once | INTRAVENOUS | Status: AC
  Administered 2019-11-30: 2.1 via ORAL

## 2019-12-01 ENCOUNTER — Encounter: Payer: Self-pay | Admitting: Endocrinology

## 2019-12-01 ENCOUNTER — Ambulatory Visit (INDEPENDENT_AMBULATORY_CARE_PROVIDER_SITE_OTHER): Payer: Medicare Other | Admitting: Endocrinology

## 2019-12-01 VITALS — BP 124/80 | HR 93 | Ht 67.0 in | Wt 243.0 lb

## 2019-12-01 DIAGNOSIS — Z794 Long term (current) use of insulin: Secondary | ICD-10-CM | POA: Diagnosis not present

## 2019-12-01 DIAGNOSIS — E119 Type 2 diabetes mellitus without complications: Secondary | ICD-10-CM

## 2019-12-01 LAB — POCT GLYCOSYLATED HEMOGLOBIN (HGB A1C): Hemoglobin A1C: 7.3 % — AB (ref 4.0–5.6)

## 2019-12-01 MED ORDER — TRULICITY 1.5 MG/0.5ML ~~LOC~~ SOAJ: 1.5000 mg | SUBCUTANEOUS | 11 refills | Status: DC

## 2019-12-01 NOTE — Patient Instructions (Addendum)
Please continue the same V-GO-30, and: Reduce the Trulicity to 1.5 mg per week. Please continue the same other diabetes medications.  check your blood sugar twice a day.  vary the time of day when you check, between before the 3 meals, and at bedtime.  also check if you have symptoms of your blood sugar being too high or too low.  please keep a record of the readings and bring it to your next appointment here (or you can bring the meter itself).  You can write it on any piece of paper.  please call us sooner if your blood sugar goes below 70, or if you have a lot of readings over 200. Please come back for a follow-up appointment in 2-3 months.

## 2019-12-01 NOTE — Progress Notes (Signed)
Subjective:    Patient ID: [name], female    DOB: Jul 11, 1956, 64 y.o.   MRN: [id number]  HPI Pt returns for f/u of diabetes mellitus: DM type: Insulin-requiring type 2.   Dx'ed: 4098 Complications: none Therapy: insulin since soon after dx, 3 oral meds, and Trulicity.  GDM: never (G0) DKA: never Severe hypoglycemia: never.   Pancreatitis: never.  Other: she stopped victoza, due to lack of effect; she uses V-GO-30; metformin dosage is limited by nausea.   Interval history: She takes 1-19 clicks per day, via the V-GO-30.  she brings her meter with her cbg's which I have reviewed today.  cbg varies from 97-220.  There is no trend throughout the day.  She has intermitt n/v.  She says this is present x 6 mos.   Past Medical History:  Diagnosis Date  . Adenocarcinoma of right lung, stage 1 ([place]) 11/29/2016  . Cancer ([place])   . Chronic pain   . Depression    takes Lexapro daily  . Diabetes mellitus    takes Metformin daily and has an insulin pump.Fasting blood sugar runs250  . GERD (gastroesophageal reflux disease)    takes Pantoprazole daily  . History of colon polyps    benign  . Hyperlipidemia    takes Atorvastatin daily  . Hypertension    takes Amlodipine and Lisinopril daily  . Insomnia    takes Trazodone nightly  . Nocturia   . Thyroid disease   . Urinary frequency   . Urinary urgency     Past Surgical History:  Procedure Laterality Date  . ABDOMINAL HYSTERECTOMY    . ABDOMINAL SURGERY    . BIOPSY  05/28/2019   Procedure: BIOPSY;  Surgeon: Danie Binder, MD;  [facility];  Service: Endoscopy;;  random colon/gastric/duodenum  . COLONOSCOPY  2005 MAC   TICs, IH  . COLONOSCOPY N/A 03/08/2014   Procedure: COLONOSCOPY;  Surgeon: Danie Binder, MD;  [facility];  Service: Endoscopy;  Laterality: N/A;  1015  . COLONOSCOPY WITH PROPOFOL N/A 05/28/2019   Procedure: COLONOSCOPY WITH PROPOFOL;  Surgeon: Danie Binder, MD;  [facility];  Service: Endoscopy;  Laterality: N/A;  9:15am  . CYSTOSTOMY  11/22/2011   Procedure: CYSTOSTOMY SUPRAPUBIC;  Surgeon: Marissa Nestle, MD;  Location: [facility];  Service: Urology;  Laterality: N/A;  . ESOPHAGOGASTRODUODENOSCOPY N/A 03/08/2014   Procedure: ESOPHAGOGASTRODUODENOSCOPY (EGD);  Surgeon: Danie Binder, MD;  [facility];  Service: Endoscopy;  Laterality: N/A;  . ESOPHAGOGASTRODUODENOSCOPY    . ESOPHAGOGASTRODUODENOSCOPY (EGD) WITH PROPOFOL N/A 05/28/2019   Procedure: ESOPHAGOGASTRODUODENOSCOPY (EGD) WITH PROPOFOL;  Surgeon: Danie Binder, MD;  [facility];  Service: Endoscopy;  Laterality: N/A;  . GIVENS CAPSULE STUDY N/A 06/15/2019   Procedure: GIVENS CAPSULE STUDY;  Surgeon: Danie Binder, MD;  [facility];  Service: Endoscopy;  Laterality: N/A;  7:30am  . HALLUX VALGUS CORRECTION     bilateral foot surgery for bone repairs-multiple  . LOBECTOMY Right 10/15/2016   Procedure: RIGHT UPPER LOBECTOMY;  Surgeon: [name], MD;  Location: [place];  Service: Thoracic;  Laterality: Right;  . LYMPH NODE DISSECTION Right 10/15/2016   Procedure: LYMPH NODE DISSECTION;  Surgeon: [name], MD;  Location: [place];  Service: Thoracic;  Laterality: Right;  . POLYPECTOMY  05/28/2019   Procedure: POLYPECTOMY;  Surgeon: Danie Binder, MD;  [facility];  Service: Endoscopy;;  .  SAVORY DILATION N/A 05/28/2019   Procedure: SAVORY DILATION;  Surgeon: Danie Binder, MD;  [facility];  Service: Endoscopy;  Laterality: N/A;  15/16/17  . VAGINA RECONSTRUCTION SURGERY     tvt 2006- [place]  . VIDEO ASSISTED THORACOSCOPY (VATS)/WEDGE RESECTION Right 10/15/2016   Procedure: RIGHT VIDEO ASSISTED THORACOSCOPY (VATS)/WEDGE RESECTION;  Surgeon: [name], MD;  Location: [facility];  Service: Thoracic;  Laterality: Right;    Social History   Socioeconomic History  . Marital status: Single    Spouse name: Not  on file  . Number of children: Not on file  . Years of education: Not on file  . Highest education level: Not on file  Occupational History  . Not on file  Tobacco Use  . Smoking status: Former Smoker    Packs/day: 1.00    Years: 32.00    Pack years: 32.00    Types: Cigarettes  . Smokeless tobacco: Never Used  . Tobacco comment: quit smoking 20 yrs ago  Substance and Sexual Activity  . Alcohol use: No    Comment: rarely  . Drug use: No  . Sexual activity: Not on file  Other Topics Concern  . Not on file  Social History Narrative  . Not on file   Social Determinants of Health   Financial Resource Strain:   . Difficulty of Paying Living Expenses:   Food Insecurity:   . Worried About Charity fundraiser in the Last Year:   . Arboriculturist in the Last Year:   Transportation Needs:   . Film/video editor (Medical):   Marland Kitchen Lack of Transportation (Non-Medical):   Physical Activity:   . Days of Exercise per Week:   . Minutes of Exercise per Session:   Stress:   . Feeling of Stress :   Social Connections:   . Frequency of Communication with Friends and Family:   . Frequency of Social Gatherings with Friends and Family:   . Attends Religious Services:   . Active Member of Clubs or Organizations:   . Attends Archivist Meetings:   Marland Kitchen Marital Status:   Intimate Partner Violence:   . Fear of Current or Ex-Partner:   . Emotionally Abused:   Marland Kitchen Physically Abused:   . Sexually Abused:     Current Outpatient Medications on File Prior to Visit  Medication Sig Dispense Refill  . ACCU-CHEK AVIVA PLUS test strip USE TO MONITOR GLUCOSE LEVELS TWICE A DAY E11.9 100 strip 12  . ACCU-CHEK SOFTCLIX LANCETS lancets Use to monitor glucose levels BID; E11.9 100 each 12  . acetaminophen (TYLENOL) 500 MG tablet Take 1 tablet (500 mg total) by mouth every 6 (six) hours as needed. (Patient taking differently: Take 500 mg by mouth every 6 (six) hours as needed (for pain). ) 30  tablet 0  . amLODipine (NORVASC) 5 MG tablet Take 5 mg by mouth daily.     Marland Kitchen atorvastatin (LIPITOR) 20 MG tablet Take 20 mg by mouth daily.  11  . benzonatate (TESSALON) 100 MG capsule Take 1 capsule (100 mg total) by mouth every 8 (eight) hours. 30 capsule 0  . Blood Glucose Calibration (GLUCOSE CONTROL) SOLN 1 Bottle by In Vitro route as needed. Use to calibrate Accu-Chek Aviva Plus device prn; please provide control solution compatible with Accu-Chek Aviva Plus device. 1 each 1  . Blood Glucose Monitoring Suppl (ACCU-CHEK AVIVA PLUS) w/Device KIT 1 each by Does not apply route 2 (two)  times daily. Use to monitor glucose levels BID; E11.9 1 kit 0  . bromocriptine (PARLODEL) 2.5 MG tablet Take 1 tablet (2.5 mg total) by mouth daily. 30 tablet 11  . cetirizine (ZYRTEC ALLERGY) 10 MG tablet Take 1 tablet (10 mg total) by mouth daily. 30 tablet 0  . chlorthalidone (HYGROTON) 25 MG tablet Take 12.5 mg by mouth daily.     Marland Kitchen escitalopram (LEXAPRO) 10 MG tablet Take 1 tablet (10 mg total) by mouth every morning. 90 tablet 3  . FARXIGA 10 MG TABS tablet TAKE 1 TABLET BY MOUTH EVERY DAY 30 tablet 11  . insulin aspart (NOVOLOG) 100 UNIT/ML injection FOR USE IN PUMP, TOTAL OF  70 UNITS PER DAY 30 mL 8  . Insulin Disposable Pump (V-GO 30) KIT 1 Device by Does not apply route daily. 30 kit 11  . lisinopril (PRINIVIL,ZESTRIL) 40 MG tablet Take 40 mg by mouth daily.    . metFORMIN (GLUCOPHAGE-XR) 500 MG 24 hr tablet Take 500 mg by mouth daily with breakfast.    . pantoprazole (PROTONIX) 40 MG tablet 1 PO 30 MINS BEFORE YOUR FIRST AND LAST MEAL. 60 tablet 11  . promethazine (PHENERGAN) 12.5 MG tablet 1-2 po q4-6 h prn nausea or vomiting 30 tablet 1  . traZODone (DESYREL) 100 MG tablet Take 2 tablets (200 mg total) by mouth at bedtime. 180 tablet 3  . YUVAFEM 10 MCG TABS vaginal tablet Place 10 mcg vaginally See admin instructions. Twice a week as needed for vaginal dryness  11  . fluticasone (FLONASE) 50 MCG/ACT  nasal spray Place 1 spray into both nostrils daily for 14 days. 16 g 0   No current facility-administered medications on file prior to visit.    Allergies  Allergen Reactions  . Synthroid [Levothyroxine Sodium] Anaphylaxis  . Iodine Hives  . Povidone-Iodine Itching and Rash    Family History  Problem Relation Age of Onset  . Asthma Other   . Diabetes Other   . Arthritis Other   . Cancer Mother   . Cancer Brother   . Anesthesia problems Neg Hx   . Hypotension Neg Hx   . Malignant hyperthermia Neg Hx   . Pseudochol deficiency Neg Hx   . Colon polyps Neg Hx   . Colon cancer Neg Hx   . Celiac disease Neg Hx   . Pancreatic cancer Neg Hx   . Stomach cancer Neg Hx   . Ulcerative colitis Neg Hx   . Crohn's disease Neg Hx     BP 124/80   Pulse 93   Ht _0  (1.702 m)   Wt 243 lb (110.2 kg)   SpO2 93%   BMI 38.06 kg/m    Review of Systems She denies hypoglycemia.      Objective:   Physical Exam VITAL SIGNS:  See vs page GENERAL: no distress Pulses: dorsalis pedis intact bilat.   MSK: no deformity of the feet CV: no leg edema Skin:  no ulcer on the feet.  normal color and temp on the feet. Neuro: sensation is intact to touch on the feet  Lab Results  Component Value Date   HGBA1C 7.3 (A) 12/01/2019        Assessment & Plan:  Insulin-requiring type 2 DM: well-controlled.  N/v, worse. Side effect of meds.   Patient Instructions  Please continue the same V-GO-30, and: Reduce the Trulicity to 1.5 mg per week. Please continue the same other diabetes medications.  check your blood sugar twice a  day.  vary the time of day when you check, between before the 3 meals, and at bedtime.  also check if you have symptoms of your blood sugar being too high or too low.  please keep a record of the readings and bring it to your next appointment here (or you can bring the meter itself).  You can write it on any piece of paper.  please call us sooner if your blood sugar goes  below 70, or if you have a lot of readings over 200. Please come back for a follow-up appointment in 2-3 months.

## 2019-12-07 ENCOUNTER — Telehealth: Payer: Self-pay | Admitting: Medical Oncology

## 2019-12-07 NOTE — Telephone Encounter (Signed)
"  Allergic to iodine on skin only. Sometimes I have a skin rash if it is applied to my skin."

## 2019-12-08 ENCOUNTER — Telehealth: Payer: Self-pay | Admitting: Internal Medicine

## 2019-12-08 ENCOUNTER — Other Ambulatory Visit: Payer: Self-pay

## 2019-12-08 ENCOUNTER — Encounter (HOSPITAL_COMMUNITY): Payer: Self-pay

## 2019-12-08 ENCOUNTER — Ambulatory Visit (HOSPITAL_COMMUNITY)
Admission: RE | Admit: 2019-12-08 | Discharge: 2019-12-08 | Disposition: A | Discharge status: Home / Self Care | Payer: Medicare Other | Source: Ambulatory Visit | Attending: Internal Medicine | Admitting: Internal Medicine

## 2019-12-08 ENCOUNTER — Inpatient Hospital Stay: Payer: Medicare Other | Attending: Internal Medicine

## 2019-12-08 DIAGNOSIS — E785 Hyperlipidemia, unspecified: Secondary | ICD-10-CM | POA: Diagnosis not present

## 2019-12-08 DIAGNOSIS — E119 Type 2 diabetes mellitus without complications: Secondary | ICD-10-CM | POA: Insufficient documentation

## 2019-12-08 DIAGNOSIS — Z86010 Personal history of colonic polyps: Secondary | ICD-10-CM | POA: Diagnosis not present

## 2019-12-08 DIAGNOSIS — F329 Major depressive disorder, single episode, unspecified: Secondary | ICD-10-CM | POA: Insufficient documentation

## 2019-12-08 DIAGNOSIS — C3490 Malignant neoplasm of unspecified part of unspecified bronchus or lung: Secondary | ICD-10-CM | POA: Insufficient documentation

## 2019-12-08 DIAGNOSIS — I10 Essential (primary) hypertension: Secondary | ICD-10-CM | POA: Diagnosis not present

## 2019-12-08 DIAGNOSIS — G4700 Insomnia, unspecified: Secondary | ICD-10-CM | POA: Diagnosis not present

## 2019-12-08 DIAGNOSIS — K219 Gastro-esophageal reflux disease without esophagitis: Secondary | ICD-10-CM | POA: Insufficient documentation

## 2019-12-08 DIAGNOSIS — C3411 Malignant neoplasm of upper lobe, right bronchus or lung: Secondary | ICD-10-CM | POA: Diagnosis not present

## 2019-12-08 LAB — CMP (CANCER CENTER ONLY)
ALT: 15 U/L (ref 0–44)
AST: 15 U/L (ref 15–41)
Albumin: 3.6 g/dL (ref 3.5–5.0)
Alkaline Phosphatase: 82 U/L (ref 38–126)
Anion gap: 10 (ref 5–15)
BUN: 17 mg/dL (ref 8–23)
CO2: 26 mmol/L (ref 22–32)
Calcium: 9.1 mg/dL (ref 8.9–10.3)
Chloride: 106 mmol/L (ref 98–111)
Creatinine: 0.87 mg/dL (ref 0.44–1.00)
GFR, Est AFR Am: >60 mL/min (ref >60)
GFR, Estimated: >60 mL/min (ref >60)
Glucose, Bld: 147 mg/dL — ABNORMAL HIGH (ref 70–99)
Potassium: 4.2 mmol/L (ref 3.5–5.1)
Sodium: 142 mmol/L (ref 135–145)
Total Bilirubin: 0.4 mg/dL (ref 0.3–1.2)
Total Protein: 6.8 g/dL (ref 6.5–8.1)

## 2019-12-08 LAB — CBC WITH DIFFERENTIAL (CANCER CENTER ONLY)
Abs Immature Granulocytes: 0.04 10*3/uL (ref 0.00–0.07)
Basophils Absolute: 0.1 10*3/uL (ref 0.0–0.1)
Basophils Relative: 1 %
Eosinophils Absolute: 1.1 10*3/uL — ABNORMAL HIGH (ref 0.0–0.5)
Eosinophils Relative: 9 %
HCT: 41.1 % (ref 36.0–46.0)
Hemoglobin: 12.7 g/dL (ref 12.0–15.0)
Immature Granulocytes: 0 %
Lymphocytes Relative: 17 %
Lymphs Abs: 2 10*3/uL (ref 0.7–4.0)
MCH: 24.1 pg — ABNORMAL LOW (ref 26.0–34.0)
MCHC: 30.9 g/dL (ref 30.0–36.0)
MCV: 77.8 fL — ABNORMAL LOW (ref 80.0–100.0)
Monocytes Absolute: 0.6 10*3/uL (ref 0.1–1.0)
Monocytes Relative: 5 %
Neutro Abs: 7.8 10*3/uL — ABNORMAL HIGH (ref 1.7–7.7)
Neutrophils Relative %: 68 %
Platelet Count: 273 10*3/uL (ref 150–400)
RBC: 5.28 MIL/uL — ABNORMAL HIGH (ref 3.87–5.11)
RDW: 14.6 % (ref 11.5–15.5)
WBC Count: 11.6 10*3/uL — ABNORMAL HIGH (ref 4.0–10.5)
nRBC: 0 % (ref 0.0–0.2)

## 2019-12-08 MED ORDER — SODIUM CHLORIDE (PF) 0.9 % IJ SOLN
INTRAMUSCULAR | Status: AC
  Filled 2019-12-08: qty 50

## 2019-12-08 MED ORDER — IOHEXOL 300 MG/ML  SOLN
75.0000 mL | Freq: Once | INTRAMUSCULAR | Status: AC | PRN
  Administered 2019-12-08: 75 mL via INTRAVENOUS

## 2019-12-08 NOTE — Telephone Encounter (Signed)
Called pt per 5/11 sch message - unable to reach pt to ask if r/s was okay . Left appt as is on 5/13

## 2019-12-09 ENCOUNTER — Inpatient Hospital Stay (HOSPITAL_BASED_OUTPATIENT_CLINIC_OR_DEPARTMENT_OTHER): Payer: Medicare Other | Admitting: Internal Medicine

## 2019-12-09 ENCOUNTER — Encounter: Payer: Self-pay | Admitting: Internal Medicine

## 2019-12-09 ENCOUNTER — Other Ambulatory Visit: Payer: Self-pay

## 2019-12-09 DIAGNOSIS — C3411 Malignant neoplasm of upper lobe, right bronchus or lung: Secondary | ICD-10-CM | POA: Diagnosis not present

## 2019-12-09 DIAGNOSIS — C3490 Malignant neoplasm of unspecified part of unspecified bronchus or lung: Secondary | ICD-10-CM

## 2019-12-09 NOTE — Progress Notes (Signed)
[facility] [phone]   [phone]  OFFICE PROGRESS NOTE  Koirala, Dibas, MD [address] [place] [address]  DIAGNOSIS: Stage IA (T1a, N0, M0) non-small cell lung cancer, adenocarcinoma presented with right upper lobe pulmonary nodule   PRIOR THERAPY: S/P right VATS with right upper lobectomy and mediastinal lymph node dissection under the care of Dr. Roxan Hockey on October 15, 2016.  CURRENT THERAPY: Observation.  INTERVAL HISTORY: [name] 64 y.o. female returns to the clinic today for follow-up visit.  The patient is feeling fine today with no concerning complaints except for upper gastrointestinal problem with nausea and vomiting.  She has been evaluated by her gastroenterologist Dr. Oneida Alar.  She denied having any current chest pain, shortness of breath, cough or hemoptysis.  She denied having any fever or chills.  She has no nausea, vomiting, diarrhea or constipation.  She has no headache or visual changes.  She is here today for evaluation with repeat CT scan of the chest for restaging of her disease.  MEDICAL HISTORY: Past Medical History:  Diagnosis Date  . Adenocarcinoma of right lung, stage 1 ([facility]) 11/29/2016  . Cancer ([facility])   . Chronic pain   . Depression    takes Lexapro daily  . Diabetes mellitus    takes Metformin daily and has an insulin pump.Fasting blood sugar runs250  . GERD (gastroesophageal reflux disease)    takes Pantoprazole daily  . History of colon polyps    benign  . Hyperlipidemia    takes Atorvastatin daily  . Hypertension    takes Amlodipine and Lisinopril daily  . Insomnia    takes Trazodone nightly  . Nocturia   . Thyroid disease   . Urinary frequency   . Urinary urgency     ALLERGIES:  is allergic to synthroid [levothyroxine sodium]; iodine; and povidone-iodine.  MEDICATIONS:  Current Outpatient Medications  Medication Sig Dispense Refill  . ACCU-CHEK AVIVA PLUS test  strip USE TO MONITOR GLUCOSE LEVELS TWICE A DAY E11.9 100 strip 12  . ACCU-CHEK SOFTCLIX LANCETS lancets Use to monitor glucose levels BID; E11.9 100 each 12  . acetaminophen (TYLENOL) 500 MG tablet Take 1 tablet (500 mg total) by mouth every 6 (six) hours as needed. (Patient taking differently: Take 500 mg by mouth every 6 (six) hours as needed (for pain). ) 30 tablet 0  . amLODipine (NORVASC) 5 MG tablet Take 5 mg by mouth daily.     Marland Kitchen atorvastatin (LIPITOR) 20 MG tablet Take 20 mg by mouth daily.  11  . benzonatate (TESSALON) 100 MG capsule Take 1 capsule (100 mg total) by mouth every 8 (eight) hours. 30 capsule 0  . Blood Glucose Calibration (GLUCOSE CONTROL) SOLN 1 Bottle by In Vitro route as needed. Use to calibrate Accu-Chek Aviva Plus device prn; please provide control solution compatible with Accu-Chek Aviva Plus device. 1 each 1  . Blood Glucose Monitoring Suppl (ACCU-CHEK AVIVA PLUS) w/Device KIT 1 each by Does not apply route 2 (two) times daily. Use to monitor glucose levels BID; E11.9 1 kit 0  . bromocriptine (PARLODEL) 2.5 MG tablet Take 1 tablet (2.5 mg total) by mouth daily. 30 tablet 11  . cetirizine (ZYRTEC ALLERGY) 10 MG tablet Take 1 tablet (10 mg total) by mouth daily. 30 tablet 0  . chlorthalidone (HYGROTON) 25 MG tablet Take 12.5 mg by mouth daily.     . Dulaglutide (TRULICITY) 1.5 EG/3.1DV SOPN Inject 1.5 mg  into the skin once a week. 4 pen 11  . escitalopram (LEXAPRO) 10 MG tablet Take 1 tablet (10 mg total) by mouth every morning. 90 tablet 3  . FARXIGA 10 MG TABS tablet TAKE 1 TABLET BY MOUTH EVERY DAY 30 tablet 11  . fluticasone (FLONASE) 50 MCG/ACT nasal spray Place 1 spray into both nostrils daily for 14 days. 16 g 0  . insulin aspart (NOVOLOG) 100 UNIT/ML injection FOR USE IN PUMP, TOTAL OF  70 UNITS PER DAY 30 mL 8  . Insulin Disposable Pump (V-GO 30) KIT 1 Device by Does not apply route daily. 30 kit 11  . lisinopril (PRINIVIL,ZESTRIL) 40 MG tablet Take 40 mg by  mouth daily.    . metFORMIN (GLUCOPHAGE-XR) 500 MG 24 hr tablet Take 500 mg by mouth daily with breakfast.    . pantoprazole (PROTONIX) 40 MG tablet 1 PO 30 MINS BEFORE YOUR FIRST AND LAST MEAL. 60 tablet 11  . promethazine (PHENERGAN) 12.5 MG tablet 1-2 po q4-6 h prn nausea or vomiting 30 tablet 1  . traZODone (DESYREL) 100 MG tablet Take 2 tablets (200 mg total) by mouth at bedtime. 180 tablet 3  . YUVAFEM 10 MCG TABS vaginal tablet Place 10 mcg vaginally See admin instructions. Twice a week as needed for vaginal dryness  11   No current facility-administered medications for this visit.    SURGICAL HISTORY:  Past Surgical History:  Procedure Laterality Date  . ABDOMINAL HYSTERECTOMY    . ABDOMINAL SURGERY    . BIOPSY  05/28/2019   Procedure: BIOPSY;  Surgeon: Danie Binder, MD;  [facility];  Service: Endoscopy;;  random colon/gastric/duodenum  . COLONOSCOPY  2005 [facility]   TICs, IH  . COLONOSCOPY N/A 03/08/2014   Procedure: COLONOSCOPY;  Surgeon: Danie Binder, MD;  [facility];  Service: Endoscopy;  Laterality: N/A;  1015  . COLONOSCOPY WITH PROPOFOL N/A 05/28/2019   Procedure: COLONOSCOPY WITH PROPOFOL;  Surgeon: Danie Binder, MD;  [facility];  Service: Endoscopy;  Laterality: N/A;  9:15am  . CYSTOSTOMY  11/22/2011   Procedure: CYSTOSTOMY SUPRAPUBIC;  Surgeon: Marissa Nestle, MD;  Location: [facility];  Service: Urology;  Laterality: N/A;  . ESOPHAGOGASTRODUODENOSCOPY N/A 03/08/2014   Procedure: ESOPHAGOGASTRODUODENOSCOPY (EGD);  Surgeon: Danie Binder, MD;  [facility];  Service: Endoscopy;  Laterality: N/A;  . ESOPHAGOGASTRODUODENOSCOPY    . ESOPHAGOGASTRODUODENOSCOPY (EGD) WITH PROPOFOL N/A 05/28/2019   Procedure: ESOPHAGOGASTRODUODENOSCOPY (EGD) WITH PROPOFOL;  Surgeon: Danie Binder, MD;  [facility];  Service: Endoscopy;  Laterality: N/A;  . GIVENS CAPSULE STUDY N/A 06/15/2019   Procedure: GIVENS CAPSULE STUDY;   Surgeon: Danie Binder, MD;  [facility];  Service: Endoscopy;  Laterality: N/A;  7:30am  . HALLUX VALGUS CORRECTION     bilateral foot surgery for bone repairs-multiple  . LOBECTOMY Right 10/15/2016   Procedure: RIGHT UPPER LOBECTOMY;  Surgeon: [name], MD;  Location: [facility];  Service: Thoracic;  Laterality: Right;  . LYMPH NODE DISSECTION Right 10/15/2016   Procedure: LYMPH NODE DISSECTION;  Surgeon: [name], MD;  Location: [facility];  Service: Thoracic;  Laterality: Right;  . POLYPECTOMY  05/28/2019   Procedure: POLYPECTOMY;  Surgeon: Danie Binder, MD;  [facility];  Service: Endoscopy;;  . SAVORY DILATION N/A 05/28/2019   Procedure: SAVORY DILATION;  Surgeon: Danie Binder, MD;  [facility];  Service: Endoscopy;  Laterality:  N/A;  15/16/17  . VAGINA RECONSTRUCTION SURGERY     tvt 2006- [facility]  . VIDEO ASSISTED THORACOSCOPY (VATS)/WEDGE RESECTION Right 10/15/2016   Procedure: RIGHT VIDEO ASSISTED THORACOSCOPY (VATS)/WEDGE RESECTION;  Surgeon: [name], MD;  Location: [facility];  Service: Thoracic;  Laterality: Right;    REVIEW OF SYSTEMS:  A comprehensive review of systems was negative.   PHYSICAL EXAMINATION: General appearance: alert, cooperative and no distress Head: Normocephalic, without obvious abnormality, atraumatic Neck: no adenopathy, no JVD, supple, symmetrical, trachea midline and thyroid not enlarged, symmetric, no tenderness/mass/nodules Lymph nodes: Cervical, supraclavicular, and axillary nodes normal. Resp: clear to auscultation bilaterally Back: symmetric, no curvature. ROM normal. No CVA tenderness. Cardio: regular rate and rhythm, S1, S2 normal, no murmur, click, rub or gallop GI: soft, non-tender; bowel sounds normal; no masses,  no organomegaly Extremities: extremities normal, atraumatic, no cyanosis or edema  ECOG PERFORMANCE STATUS: 1 - Symptomatic but completely ambulatory  Blood  pressure (!) 126/42, pulse 60, temperature 98.2 F (36.8 C), temperature source Temporal, resp. rate 17, height _0  (1.702 m), weight 242 lb 4.8 oz (109.9 kg), SpO2 99 %.  LABORATORY DATA: Lab Results  Component Value Date   WBC 11.6 (H) 12/08/2019   HGB 12.7 12/08/2019   HCT 41.1 12/08/2019   MCV 77.8 (L) 12/08/2019   PLT 273 12/08/2019      Chemistry      Component Value Date/Time   NA 142 12/08/2019 0849   NA 141 05/29/2017 0921   K 4.2 12/08/2019 0849   K 4.8 05/29/2017 0921   CL 106 12/08/2019 0849   CO2 26 12/08/2019 0849   CO2 28 05/29/2017 0921   BUN 17 12/08/2019 0849   BUN 13.5 05/29/2017 0921   CREATININE 0.87 12/08/2019 0849   CREATININE 0.8 05/29/2017 0921      Component Value Date/Time   CALCIUM 9.1 12/08/2019 0849   CALCIUM 9.5 05/29/2017 0921   ALKPHOS 82 12/08/2019 0849   ALKPHOS 104 05/29/2017 0921   AST 15 12/08/2019 0849   AST 17 05/29/2017 0921   ALT 15 12/08/2019 0849   ALT 20 05/29/2017 0921   BILITOT 0.4 12/08/2019 0849   BILITOT 0.40 05/29/2017 0921       RADIOGRAPHIC STUDIES: CT Chest W Contrast  Result Date: 12/08/2019 CLINICAL DATA:  Primary Cancer Type: Lung Imaging Indication: Routine surveillance Interval therapy since last imaging? No Initial Cancer Diagnosis Date: 10/15/2016    established by: Biopsy-proven Detailed Pathology: Stage IA non-small cell lung cancer, adenocarcinoma Primary Tumor location: Right upper lobe Surgeries: Right upper lobectomy 10/15/2016 Chemotherapy: No Immunotherapy? No Radiation therapy? No EXAM: CT CHEST WITH CONTRAST TECHNIQUE: Multidetector CT imaging of the chest was performed during intravenous contrast administration. CONTRAST:  28m OMNIPAQUE IOHEXOL 300 MG/ML  SOLN COMPARISON:  Most recent CT chest 12/08/2018. 09/28/2016 PET-CT. FINDINGS: Cardiovascular: Normal heart size. No significant pericardial effusion/thickening. Atherosclerotic nonaneurysmal thoracic aorta. Normal caliber pulmonary arteries. No  central pulmonary emboli. Mediastinum/Nodes: No discrete thyroid nodules. Unremarkable esophagus. Newly mildly enlarged 1.2 cm right axillary lymph node (series 2/image 43). No left axillary adenopathy. No pathologically enlarged mediastinal or hilar lymph nodes. Lungs/Pleura: No pneumothorax. No pleural effusion. Stable postsurgical changes from right upper lobectomy. No acute consolidative airspace disease, lung masses or significant pulmonary nodules. Upper abdomen: Tiny hiatal hernia. Bilateral adrenal nodules measuring 1.9 cm on the right and 1.8 cm on the left, unchanged, compatible with benign adenomas. Musculoskeletal: No aggressive appearing focal osseous lesions. Mild thoracic spondylosis. IMPRESSION:  1. Newly mildly enlarged right axillary lymph node, nonspecific. Correlate clinically for any history of recent COVID-19 vaccination in the right upper extremity to explain this finding. If there is no such history, management options include tissue sampling, further characterization with PET-CT or short-term follow-up with chest CT with IV contrast in 3 months. 2. Stable postsurgical changes from right upper lobectomy. No evidence of local tumor recurrence. Otherwise no potential findings of metastatic disease in the chest. 3. Stable bilateral adrenal adenomas. 4. Tiny hiatal hernia. 5. Aortic Atherosclerosis (ICD10-I70.0). Electronically Signed   By: Ilona Sorrel M.D.   On: 12/08/2019 11:37   NM Gastric Emptying  Result Date: 11/30/2019 CLINICAL DATA:  Nausea and vomiting for 5 months.  Diabetes. EXAM: NUCLEAR MEDICINE GASTRIC EMPTYING SCAN TECHNIQUE: After oral ingestion of radiolabeled meal, sequential abdominal images were obtained for 4 hours. Percentage of activity emptying the stomach was calculated at 1 hour, 2 hour, 3 hour, and 4 hours. RADIOPHARMACEUTICALS:  2.1 mCi Tc-17msulfur colloid in standardized meal COMPARISON:  None. FINDINGS: Expected location of the stomach in the left upper  quadrant. Ingested meal empties the stomach gradually over the course of the study. 42% emptied at 1 hr ( normal >= 10%) 70% emptied at 2 hr ( normal >= 40%) 83% emptied at 3 hr ( normal >= 70%) 86% emptied at 4 hr ( normal >= 90%) IMPRESSION: Normal gastric emptying values up to 3 hours, with slightly delayed gastric emptying noted at 4 hours. Electronically Signed   By: JMarlaine HindM.D.   On: 11/30/2019 15:05    ASSESSMENT AND PLAN: This is a very pleasant 64years old white female with stage Ia non-small cell lung cancer status post right upper lobectomy with lymph node dissection.  This was diagnosed in March 2018. The patient is currently on observation and she is feeling fine today with no concerning complaints. She had repeat CT scan of the chest performed recently.  I personally and independently reviewed the scans and discussed the results with the patient today. Her scan showed no concerning findings for disease recurrence in the chest but she has nonspecific enlargement of right axillary lymph node likely secondary to her most recent Covid vaccine in the right arm. I recommended for the patient to continue on observation with repeat CT scan of the chest in 6 months.  She was also advised to call immediately if she has any concerning symptoms in the interval especially if she has any enlargement of the axillary lymph nodes. The patient voices understanding of current disease status and treatment options and is in agreement with the current care plan. All questions were answered. The patient knows to call the clinic with any problems, questions or concerns. We can certainly see the patient much sooner if necessary.  Disclaimer: This note was dictated with voice recognition software. Similar sounding words can inadvertently be transcribed and may not be corrected upon review.

## 2019-12-10 ENCOUNTER — Ambulatory Visit: Payer: 59 | Admitting: Internal Medicine

## 2019-12-10 ENCOUNTER — Ambulatory Visit: Payer: Medicare Other | Admitting: Internal Medicine

## 2019-12-22 ENCOUNTER — Telehealth (INDEPENDENT_AMBULATORY_CARE_PROVIDER_SITE_OTHER): Payer: Medicare Other | Admitting: Psychiatry

## 2019-12-22 ENCOUNTER — Other Ambulatory Visit: Payer: Self-pay

## 2019-12-22 ENCOUNTER — Encounter (HOSPITAL_COMMUNITY): Payer: Self-pay | Admitting: Psychiatry

## 2019-12-22 DIAGNOSIS — F331 Major depressive disorder, recurrent, moderate: Secondary | ICD-10-CM | POA: Diagnosis not present

## 2019-12-22 MED ORDER — TRAZODONE HCL 100 MG PO TABS: 200.0000 mg | ORAL_TABLET | Freq: Every day | ORAL | 3 refills | Status: DC

## 2019-12-22 MED ORDER — ESCITALOPRAM OXALATE 10 MG PO TABS: 10.0000 mg | ORAL_TABLET | Freq: Every morning | ORAL | 3 refills | Status: DC
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
## 2020-01-06 ENCOUNTER — Ambulatory Visit (INDEPENDENT_AMBULATORY_CARE_PROVIDER_SITE_OTHER): Payer: Medicare Other | Admitting: Nurse Practitioner

## 2020-01-06 ENCOUNTER — Other Ambulatory Visit: Payer: Self-pay

## 2020-01-06 ENCOUNTER — Encounter: Payer: Self-pay | Admitting: Nurse Practitioner

## 2020-01-06 VITALS — BP 127/74 | HR 81 | Temp 97.1°F | Ht 67.0 in | Wt 245.4 lb

## 2020-01-06 DIAGNOSIS — E66812 Obesity, class 2: Secondary | ICD-10-CM

## 2020-01-06 DIAGNOSIS — R110 Nausea: Secondary | ICD-10-CM | POA: Diagnosis not present

## 2020-01-06 DIAGNOSIS — R1013 Epigastric pain: Secondary | ICD-10-CM | POA: Diagnosis not present

## 2020-01-06 DIAGNOSIS — Z6839 Body mass index (BMI) 39.0-39.9, adult: Secondary | ICD-10-CM | POA: Diagnosis not present

## 2020-01-06 DIAGNOSIS — R109 Unspecified abdominal pain: Secondary | ICD-10-CM | POA: Insufficient documentation

## 2020-01-06 NOTE — Progress Notes (Signed)
Referring Provider: Lujean Amel, MD Primary Care Physician:  Lujean Amel, MD Primary GI:  Dr. Gala Romney (in the absence of Dr. Oneida Alar); pending Dr. Abbey Chatters  Chief Complaint  Patient presents with  . Nausea    comes/goes, no vomiting    HPI:   [name] is a 64 y.o. female who presents for nausea and vomiting.  The patient was last seen in our office 07/15/2019 for dysphagia, microcytic anemia, obesity.  At that time the patient noted an episode of bloating and associated nausea, no identified trigger.  Oral iron resulting in constipation and feels this may be resulting in her bloating.  Overall recommended continue weight loss efforts, check CBC and ferritin in April 2021.  Dysphagia well controlled and monitor.  Follow-up in 6 months.  Labs completed 10/08/2018, CBC finding normalized hemoglobin at 12.0, normocytic but hyperchromic.  Ferritin low at 8.  Recommended referral to hematology for IDA, notify us for any bleeding.  The patient does note that she did donate blood 3 days prior to labs.  Given this information advised to hold off on hematology consult.  Patient called our office 11/10/2019 with ongoing nausea.  Using Gaviscon, "Magnallum" and "Imatron".  No vomiting but does note frequent belching and not sure if this is just GERD.  Denies fevers.  Normal bowel movements.  Did have Covid in January 2021.  Recommended referral to PCP to check for UTI versus GERD flare.  Phenergan sent to [facility] for nausea.  Further discussion with the patient the next day indicated good and bad days, random onset vomiting, belching taste like "vomit".  HIDA scan normal in the last 2 years, hemoglobin A1c typically 6.8.  Recommended twice daily Protonix, Phenergan as needed, complete gastric emptying study, gastroparesis diet, outpatient visit in June 2021.  GES was completed 11/30/2019 which found normal gastric emptying up to 3 hours with slight delay in gastric emptying at 4 hours (86% versus  normal +/= 90%).  Results communicated to patient along with the feeling that intermittent nausea/vomiting likely uncontrolled reflux.  Recommended avoid triggers, continue Protonix, use Phenergan, continue weight loss efforts, outpatient follow-up in June.  Overall the patient felt it is not related to reflux, was started on Trulicity by endocrine and felt this could be responsible.  Her dose was reduced, no reflux concerns, no middle of the night vomiting.  Today she states she is doing okay overall.  She has intermittent nausea, no recent vomiting. She thinks her worsened symptoms were due to Trulicity and is overall improved with Trulicity adjustment from 3.0 to 1.5. She denies overt reflux but is amendable to trying Protonix bid, but will need a Rx. Denies any recent vomiting. She does still have some mild persistent nausea that is intermittent. She does have intermittent abdominal discomfort when she does get nauseated. Denies N/V, hematochezia, melena, fever, chills, unintentional weight loss. Denies URI or flu-like symptoms. Denies loss of sense of taste or smell. The patient has received COVID-19 vaccination(s). Denies chest pain, dyspnea, dizziness, lightheadedness, syncope, near syncope. Denies any other upper or lower GI symptoms.  States her blood sugars have been doing well, continues to see endocrinology.  Past Medical History:  Diagnosis Date  . Adenocarcinoma of right lung, stage 1 ([place]) 11/29/2016  . Cancer ([place])   . Chronic pain   . Depression    takes Lexapro daily  . Diabetes mellitus    takes Metformin daily and has an insulin pump.Fasting blood sugar runs250  . GERD (  gastroesophageal reflux disease)    takes Pantoprazole daily  . History of colon polyps    benign  . Hyperlipidemia    takes Atorvastatin daily  . Hypertension    takes Amlodipine and Lisinopril daily  . Insomnia    takes Trazodone nightly  . Nocturia   . Thyroid disease   . Urinary frequency   . Urinary  urgency     Past Surgical History:  Procedure Laterality Date  . ABDOMINAL HYSTERECTOMY    . ABDOMINAL SURGERY    . BIOPSY  05/28/2019   Procedure: BIOPSY;  Surgeon: Danie Binder, MD;  [facility];  Service: Endoscopy;;  random colon/gastric/duodenum  . COLONOSCOPY  2005 [facility]   TICs, IH  . COLONOSCOPY N/A 03/08/2014   Procedure: COLONOSCOPY;  Surgeon: Danie Binder, MD;  [facility];  Service: Endoscopy;  Laterality: N/A;  1015  . COLONOSCOPY WITH PROPOFOL N/A 05/28/2019   Procedure: COLONOSCOPY WITH PROPOFOL;  Surgeon: Danie Binder, MD;  [facility];  Service: Endoscopy;  Laterality: N/A;  9:15am  . CYSTOSTOMY  11/22/2011   Procedure: CYSTOSTOMY SUPRAPUBIC;  Surgeon: Marissa Nestle, MD;  Location: [facility];  Service: Urology;  Laterality: N/A;  . ESOPHAGOGASTRODUODENOSCOPY N/A 03/08/2014   Procedure: ESOPHAGOGASTRODUODENOSCOPY (EGD);  Surgeon: Danie Binder, MD;  [facility];  Service: Endoscopy;  Laterality: N/A;  . ESOPHAGOGASTRODUODENOSCOPY    . ESOPHAGOGASTRODUODENOSCOPY (EGD) WITH PROPOFOL N/A 05/28/2019   Procedure: ESOPHAGOGASTRODUODENOSCOPY (EGD) WITH PROPOFOL;  Surgeon: Danie Binder, MD;  [facility];  Service: Endoscopy;  Laterality: N/A;  . GIVENS CAPSULE STUDY N/A 06/15/2019   Procedure: GIVENS CAPSULE STUDY;  Surgeon: Danie Binder, MD;  [facility];  Service: Endoscopy;  Laterality: N/A;  7:30am  . HALLUX VALGUS CORRECTION     bilateral foot surgery for bone repairs-multiple  . LOBECTOMY Right 10/15/2016   Procedure: RIGHT UPPER LOBECTOMY;  Surgeon: [name], MD;  Location: [facility];  Service: Thoracic;  Laterality: Right;  . LYMPH NODE DISSECTION Right 10/15/2016   Procedure: LYMPH NODE DISSECTION;  Surgeon: [name], MD;  Location: [facility];  Service: Thoracic;  Laterality: Right;  . POLYPECTOMY  05/28/2019   Procedure: POLYPECTOMY;  Surgeon: Danie Binder, MD;   [facility];  Service: Endoscopy;;  . SAVORY DILATION N/A 05/28/2019   Procedure: SAVORY DILATION;  Surgeon: Danie Binder, MD;  [facility];  Service: Endoscopy;  Laterality: N/A;  15/16/17  . VAGINA RECONSTRUCTION SURGERY     tvt 2006- [place]  . VIDEO ASSISTED THORACOSCOPY (VATS)/WEDGE RESECTION Right 10/15/2016   Procedure: RIGHT VIDEO ASSISTED THORACOSCOPY (VATS)/WEDGE RESECTION;  Surgeon: [name], MD;  Location: [place];  Service: Thoracic;  Laterality: Right;    Current Outpatient Medications  Medication Sig Dispense Refill  . ACCU-CHEK AVIVA PLUS test strip USE TO MONITOR GLUCOSE LEVELS TWICE A DAY E11.9 100 strip 12  . ACCU-CHEK SOFTCLIX LANCETS lancets Use to monitor glucose levels BID; E11.9 100 each 12  . acetaminophen (TYLENOL) 500 MG tablet Take 1 tablet (500 mg total) by mouth every 6 (six) hours as needed. (Patient taking differently: Take 500 mg by mouth every 6 (six) hours as needed (for pain). ) 30 tablet 0  . amLODipine (NORVASC) 5 MG tablet Take 5 mg by mouth daily.     Marland Kitchen atorvastatin (LIPITOR) 20 MG tablet Take 20 mg by mouth daily.  11  .  benzonatate (TESSALON) 100 MG capsule Take 1 capsule (100 mg total) by mouth every 8 (eight) hours. 30 capsule 0  . Blood Glucose Calibration (GLUCOSE CONTROL) SOLN 1 Bottle by In Vitro route as needed. Use to calibrate Accu-Chek Aviva Plus device prn; please provide control solution compatible with Accu-Chek Aviva Plus device. 1 each 1  . Blood Glucose Monitoring Suppl (ACCU-CHEK AVIVA PLUS) w/Device KIT 1 each by Does not apply route 2 (two) times daily. Use to monitor glucose levels BID; E11.9 1 kit 0  . bromocriptine (PARLODEL) 2.5 MG tablet Take 1 tablet (2.5 mg total) by mouth daily. 30 tablet 11  . cetirizine (ZYRTEC ALLERGY) 10 MG tablet Take 1 tablet (10 mg total) by mouth daily. 30 tablet 0  . chlorthalidone (HYGROTON) 25 MG tablet Take 12.5 mg by mouth daily.     . Dulaglutide  (TRULICITY) 1.5 WU/9.8JX SOPN Inject 1.5 mg into the skin once a week. 4 pen 11  . escitalopram (LEXAPRO) 10 MG tablet Take 1 tablet (10 mg total) by mouth every morning. 90 tablet 3  . FARXIGA 10 MG TABS tablet TAKE 1 TABLET BY MOUTH EVERY DAY 30 tablet 11  . insulin aspart (NOVOLOG) 100 UNIT/ML injection FOR USE IN PUMP, TOTAL OF  70 UNITS PER DAY 30 mL 8  . Insulin Disposable Pump (V-GO 30) KIT 1 Device by Does not apply route daily. 30 kit 11  . lisinopril (PRINIVIL,ZESTRIL) 40 MG tablet Take 40 mg by mouth daily.    . metFORMIN (GLUCOPHAGE-XR) 500 MG 24 hr tablet Take 500 mg by mouth daily with breakfast.    . pantoprazole (PROTONIX) 40 MG tablet 1 PO 30 MINS BEFORE YOUR FIRST AND LAST MEAL. 60 tablet 11  . promethazine (PHENERGAN) 12.5 MG tablet 1-2 po q4-6 h prn nausea or vomiting 30 tablet 1  . traZODone (DESYREL) 100 MG tablet Take 2 tablets (200 mg total) by mouth at bedtime. 180 tablet 3  . YUVAFEM 10 MCG TABS vaginal tablet Place 10 mcg vaginally See admin instructions. Twice a week as needed for vaginal dryness  11  . fluticasone (FLONASE) 50 MCG/ACT nasal spray Place 1 spray into both nostrils daily for 14 days. 16 g 0   No current facility-administered medications for this visit.    Allergies as of 01/06/2020 - Review Complete 01/06/2020  Allergen Reaction Noted  . Synthroid [levothyroxine sodium] Anaphylaxis 11/29/2011  . Iodine Rash 11/12/2011  . Povidone-iodine Itching and Rash 10/20/2014    Family History  Problem Relation Age of Onset  . Asthma Other   . Diabetes Other   . Arthritis Other   . Cancer Mother   . Cancer Brother   . Anesthesia problems Neg Hx   . Hypotension Neg Hx   . Malignant hyperthermia Neg Hx   . Pseudochol deficiency Neg Hx   . Colon polyps Neg Hx   . Colon cancer Neg Hx   . Celiac disease Neg Hx   . Pancreatic cancer Neg Hx   . Stomach cancer Neg Hx   . Ulcerative colitis Neg Hx   . Crohn's disease Neg Hx     Social History    Socioeconomic History  . Marital status: Single    Spouse name: Not on file  . Number of children: Not on file  . Years of education: Not on file  . Highest education level: Not on file  Occupational History  . Not on file  Tobacco Use  . Smoking status: Former Smoker  Packs/day: 1.00    Years: 32.00    Pack years: 32.00    Types: Cigarettes  . Smokeless tobacco: Never Used  . Tobacco comment: quit smoking 20 yrs ago  Substance and Sexual Activity  . Alcohol use: No    Comment: rarely  . Drug use: No  . Sexual activity: Not on file  Other Topics Concern  . Not on file  Social History Narrative  . Not on file   Social Determinants of Health   Financial Resource Strain:   . Difficulty of Paying Living Expenses:   Food Insecurity:   . Worried About Charity fundraiser in the Last Year:   . Arboriculturist in the Last Year:   Transportation Needs:   . Film/video editor (Medical):   Marland Kitchen Lack of Transportation (Non-Medical):   Physical Activity:   . Days of Exercise per Week:   . Minutes of Exercise per Session:   Stress:   . Feeling of Stress :   Social Connections:   . Frequency of Communication with Friends and Family:   . Frequency of Social Gatherings with Friends and Family:   . Attends Religious Services:   . Active Member of Clubs or Organizations:   . Attends Archivist Meetings:   Marland Kitchen Marital Status:     Subjective: Review of Systems  Constitutional: Negative for chills, fever, malaise/fatigue and weight loss.  HENT: Negative for congestion and sore throat.   Respiratory: Negative for cough and shortness of breath.   Cardiovascular: Negative for chest pain and palpitations.  Gastrointestinal: Positive for nausea (improved). Negative for abdominal pain, blood in stool, diarrhea, heartburn, melena and vomiting.  Musculoskeletal: Negative for joint pain and myalgias.  Skin: Negative for rash.  Neurological: Negative for dizziness and  weakness.  Endo/Heme/Allergies: Does not bruise/bleed easily.  Psychiatric/Behavioral: Negative for depression. The patient is not nervous/anxious.   All other systems reviewed and are negative.    Objective: BP 127/74   Pulse 81   Temp (!) 97.1 F (36.2 C) (Oral)   Ht _0  (1.702 m)   Wt 245 lb 6.4 oz (111.3 kg)   BMI 38.44 kg/m  Physical Exam Vitals and nursing note reviewed.  Constitutional:      General: She is not in acute distress.    Appearance: Normal appearance. She is well-developed. She is obese. She is not ill-appearing, toxic-appearing or diaphoretic.  HENT:     Head: Normocephalic and atraumatic.     Nose: No congestion or rhinorrhea.  Eyes:     General: No scleral icterus. Cardiovascular:     Rate and Rhythm: Normal rate and regular rhythm.     Heart sounds: Normal heart sounds.  Pulmonary:     Effort: Pulmonary effort is normal. No respiratory distress.     Breath sounds: Normal breath sounds.  Abdominal:     General: Bowel sounds are normal.     Palpations: Abdomen is soft. There is no hepatomegaly, splenomegaly or mass.     Tenderness: There is no abdominal tenderness. There is no guarding or rebound.     Hernia: No hernia is present.  Skin:    General: Skin is warm and dry.     Coloration: Skin is not jaundiced.     Findings: No rash.  Neurological:     General: No focal deficit present.     Mental Status: She is alert and oriented to person, place, and time.  Psychiatric:  Attention and Perception: Attention normal.        Mood and Affect: Mood normal.        Speech: Speech normal.        Behavior: Behavior normal.        Thought Content: Thought content normal.        Cognition and Memory: Cognition and memory normal.       01/06/2020 9:36 AM   Disclaimer: This note was dictated with voice recognition software. Similar sounding words can inadvertently be transcribed and may not be corrected upon review.

## 2020-01-06 NOTE — Patient Instructions (Signed)
Your health issues we discussed today were:   Nausea with abdominal discomfort: 1. I have sent in a prescription for Protonix 40 mg twice daily.  Take this pursing in the morning, and 30 minutes before your last meal the day 2. Continue to use Phenergan as needed for breakthrough nausea 3. Call us if you have any worsening or severe symptoms  Obesity and diabetes: 1. Continue your good efforts toward continued weight loss and ongoing blood sugar control  Overall I recommend:  1. Continue your other current medications 2. Return for follow-up in 6 months 3. Call us if you have any questions or concerns   ---------------------------------------------------------------  I am glad you have gotten your COVID-19 vaccination!  Even though you are fully vaccinated you should continue to follow CDC and state/local guidelines.  ---------------------------------------------------------------   At [facility] we value your feedback. You may receive a survey about your visit today. Please share your experience as we strive to create trusting relationships with our patients to provide genuine, compassionate, quality care.  We appreciate your understanding and patience as we review any laboratory studies, imaging, and other diagnostic tests that are ordered as we care for you. Our office policy is 5 business days for review of these results, and any emergent or urgent results are addressed in a timely manner for your best interest. If you do not hear from our office in 1 week, please contact us.   We also encourage the use of MyChart, which contains your medical information for your review as well. If you are not enrolled in this feature, an access code is on this after visit summary for your convenience. Thank you for allowing [place] to be involved in your care.  It was great to see you today!  I hope you have a great Summer!!

## 2020-01-06 NOTE — Assessment & Plan Note (Signed)
Interestingly, the patient denies overt GERD symptoms.  However, when she does have a bout of nausea she does have epigastric discomfort.  She is not currently taking Protonix and states she needs a prescription.  I will send this to her pharmacy.  Likely silent GERD/gastritis contributing to her nausea as well as her abdominal pain.  Further nausea recommendations as per above.  Follow-up in 6 months.

## 2020-01-06 NOTE — Assessment & Plan Note (Signed)
Obesity with diabetes.  Blood sugars currently doing well, per the patient.  Her Trulicity was decreased to 1.5 weekly given her significant nausea and vomiting which is a known side effect of Trulicity.  She has lost about 8 pounds in the past 6 months.  Recommend she continue her weight loss efforts and work towards good persistent blood sugar control.  Follow-up in 6 months.

## 2020-01-06 NOTE — Assessment & Plan Note (Addendum)
The patient notes nausea that is significantly improved since reduction in dose of Trulicity.  No vomiting recently.  Recent endoscopy that showed gastritis and she states that she was advised to try Protonix which she is amenable to but she needs a prescription.  I will send in a prescription for Protonix twice daily.  Gastric emptying study mostly normal until the fourth hour with mild decrease.  Follow-up in 6 months.  Continue Phenergan in the interim as needed.

## 2020-01-14 ENCOUNTER — Other Ambulatory Visit: Payer: Self-pay | Admitting: Family Medicine

## 2020-01-14 DIAGNOSIS — Z1231 Encounter for screening mammogram for malignant neoplasm of breast: Secondary | ICD-10-CM

## 2020-01-25 ENCOUNTER — Other Ambulatory Visit: Payer: Self-pay | Admitting: Endocrinology

## 2020-03-01 ENCOUNTER — Other Ambulatory Visit: Payer: Self-pay

## 2020-03-01 ENCOUNTER — Ambulatory Visit
Admission: RE | Admit: 2020-03-01 | Discharge: 2020-03-01 | Disposition: A | Discharge status: Home / Self Care | Payer: Medicare Other | Source: Ambulatory Visit | Attending: Family Medicine | Admitting: Family Medicine

## 2020-03-01 DIAGNOSIS — Z1231 Encounter for screening mammogram for malignant neoplasm of breast: Secondary | ICD-10-CM

## 2020-03-02 ENCOUNTER — Ambulatory Visit: Payer: Medicare Other | Admitting: Endocrinology

## 2020-03-18 ENCOUNTER — Other Ambulatory Visit: Payer: Self-pay

## 2020-03-18 ENCOUNTER — Encounter: Payer: Self-pay | Admitting: Endocrinology

## 2020-03-18 ENCOUNTER — Ambulatory Visit (INDEPENDENT_AMBULATORY_CARE_PROVIDER_SITE_OTHER): Payer: Medicare Other | Admitting: Endocrinology

## 2020-03-18 VITALS — BP 136/76 | HR 58 | Ht 67.0 in | Wt 239.8 lb

## 2020-03-18 DIAGNOSIS — Z794 Long term (current) use of insulin: Secondary | ICD-10-CM

## 2020-03-18 DIAGNOSIS — E119 Type 2 diabetes mellitus without complications: Secondary | ICD-10-CM

## 2020-03-18 LAB — POCT GLYCOSYLATED HEMOGLOBIN (HGB A1C): Hemoglobin A1C: 6.8 % — AB (ref 4.0–5.6)

## 2020-03-18 NOTE — Patient Instructions (Addendum)
Please continue the same diabetes medications.  check your blood sugar twice a day.  vary the time of day when you check, between before the 3 meals, and at bedtime.  also check if you have symptoms of your blood sugar being too high or too low.  please keep a record of the readings and bring it to your next appointment here (or you can bring the meter itself).  You can write it on any piece of paper.  please call us sooner if your blood sugar goes below 70, or if you have a lot of readings over 200. Please come back for a follow-up appointment in 3-4 months.

## 2020-03-18 NOTE — Progress Notes (Signed)
Subjective:    Patient ID: [name], female    DOB: 1955/08/27, 64 y.o.   MRN: [id number]  HPI Pt returns for f/u of diabetes mellitus: DM type: Insulin-requiring type 2.   Dx'ed: 3354 Complications: none Therapy: insulin since soon after dx (now V-GO-30), 3 oral meds, and Trulicity.   GDM: never (G0) DKA: never Severe hypoglycemia: never.   Pancreatitis: never.  Other: she stopped victoza, due to lack of effect; med dosages are limited by nausea.   Interval history: She takes approx 15 clicks per day, via the V-GO-30.  she brings her meter with her cbg's which I have reviewed today.  cbg varies from 88-177.  There is no trend throughout the day.   Past Medical History:  Diagnosis Date  . Adenocarcinoma of right lung, stage 1 ([place]) 11/29/2016  . Cancer ([place])   . Chronic pain   . Depression    takes Lexapro daily  . Diabetes mellitus    takes Metformin daily and has an insulin pump.Fasting blood sugar runs250  . GERD (gastroesophageal reflux disease)    takes Pantoprazole daily  . History of colon polyps    benign  . Hyperlipidemia    takes Atorvastatin daily  . Hypertension    takes Amlodipine and Lisinopril daily  . Insomnia    takes Trazodone nightly  . Nocturia   . Thyroid disease   . Urinary frequency   . Urinary urgency     Past Surgical History:  Procedure Laterality Date  . ABDOMINAL HYSTERECTOMY    . ABDOMINAL SURGERY    . BIOPSY  05/28/2019   Procedure: BIOPSY;  Surgeon: Danie Binder, MD;  [facility];  Service: Endoscopy;;  random colon/gastric/duodenum  . COLONOSCOPY  2005 MAC   TICs, IH  . COLONOSCOPY N/A 03/08/2014   Procedure: COLONOSCOPY;  Surgeon: Danie Binder, MD;  [facility];  Service: Endoscopy;  Laterality: N/A;  1015  . COLONOSCOPY WITH PROPOFOL N/A 05/28/2019   Procedure: COLONOSCOPY WITH PROPOFOL;  Surgeon: Danie Binder, MD;  [facility];  Service: Endoscopy;  Laterality: N/A;  9:15am  .  CYSTOSTOMY  11/22/2011   Procedure: CYSTOSTOMY SUPRAPUBIC;  Surgeon: Marissa Nestle, MD;  [facility];  Service: Urology;  Laterality: N/A;  . ESOPHAGOGASTRODUODENOSCOPY N/A 03/08/2014   Procedure: ESOPHAGOGASTRODUODENOSCOPY (EGD);  Surgeon: Danie Binder, MD;  [facility];  Service: Endoscopy;  Laterality: N/A;  . ESOPHAGOGASTRODUODENOSCOPY    . ESOPHAGOGASTRODUODENOSCOPY (EGD) WITH PROPOFOL N/A 05/28/2019   Procedure: ESOPHAGOGASTRODUODENOSCOPY (EGD) WITH PROPOFOL;  Surgeon: Danie Binder, MD;  [facility];  Service: Endoscopy;  Laterality: N/A;  . GIVENS CAPSULE STUDY N/A 06/15/2019   Procedure: GIVENS CAPSULE STUDY;  Surgeon: Danie Binder, MD;  [facility];  Service: Endoscopy;  Laterality: N/A;  7:30am  . HALLUX VALGUS CORRECTION     bilateral foot surgery for bone repairs-multiple  . LOBECTOMY Right 10/15/2016   Procedure: RIGHT UPPER LOBECTOMY;  Surgeon: [name], MD;  Location: [facility];  Service: Thoracic;  Laterality: Right;  . LYMPH NODE DISSECTION Right 10/15/2016   Procedure: LYMPH NODE DISSECTION;  Surgeon: [name], MD;  Location: [facility];  Service: Thoracic;  Laterality: Right;  . POLYPECTOMY  05/28/2019   Procedure: POLYPECTOMY;  Surgeon: Danie Binder, MD;  [facility];  Service: Endoscopy;;  . SAVORY DILATION N/A 05/28/2019   Procedure: SAVORY DILATION;  Surgeon: Barney Drain  L, MD;  [facility];  Service: Endoscopy;  Laterality: N/A;  15/16/17  . VAGINA RECONSTRUCTION SURGERY     tvt 2006- [place]  . VIDEO ASSISTED THORACOSCOPY (VATS)/WEDGE RESECTION Right 10/15/2016   Procedure: RIGHT VIDEO ASSISTED THORACOSCOPY (VATS)/WEDGE RESECTION;  Surgeon: [name], MD;  Location: [facility];  Service: Thoracic;  Laterality: Right;    Social History   Socioeconomic History  . Marital status: Single    Spouse name: Not on file  . Number of children: Not on file  . Years of  education: Not on file  . Highest education level: Not on file  Occupational History  . Not on file  Tobacco Use  . Smoking status: Former Smoker    Packs/day: 1.00    Years: 32.00    Pack years: 32.00    Types: Cigarettes  . Smokeless tobacco: Never Used  . Tobacco comment: quit smoking 20 yrs ago  Vaping Use  . Vaping Use: Never used  Substance and Sexual Activity  . Alcohol use: No    Comment: rarely  . Drug use: No  . Sexual activity: Not on file  Other Topics Concern  . Not on file  Social History Narrative  . Not on file   Social Determinants of Health   Financial Resource Strain:   . Difficulty of Paying Living Expenses: Not on file  Food Insecurity:   . Worried About Charity fundraiser in the Last Year: Not on file  . Ran Out of Food in the Last Year: Not on file  Transportation Needs:   . Lack of Transportation (Medical): Not on file  . Lack of Transportation (Non-Medical): Not on file  Physical Activity:   . Days of Exercise per Week: Not on file  . Minutes of Exercise per Session: Not on file  Stress:   . Feeling of Stress : Not on file  Social Connections:   . Frequency of Communication with Friends and Family: Not on file  . Frequency of Social Gatherings with Friends and Family: Not on file  . Attends Religious Services: Not on file  . Active Member of Clubs or Organizations: Not on file  . Attends Archivist Meetings: Not on file  . Marital Status: Not on file  Intimate Partner Violence:   . Fear of Current or Ex-Partner: Not on file  . Emotionally Abused: Not on file  . Physically Abused: Not on file  . Sexually Abused: Not on file    Current Outpatient Medications on File Prior to Visit  Medication Sig Dispense Refill  . ACCU-CHEK AVIVA PLUS test strip USE TO MONITOR GLUCOSE LEVELS TWICE A DAY E11.9 (Patient taking differently: 1 each by Other route 2 (two) times daily. E11.9) 100 strip 12  . ACCU-CHEK SOFTCLIX LANCETS lancets Use to  monitor glucose levels BID; E11.9 (Patient taking differently: 1 each by Other route 2 (two) times daily. E11.9) 100 each 12  . acetaminophen (TYLENOL) 500 MG tablet Take 1 tablet (500 mg total) by mouth every 6 (six) hours as needed. (Patient taking differently: Take 500 mg by mouth every 6 (six) hours as needed (for pain). ) 30 tablet 0  . amLODipine (NORVASC) 5 MG tablet Take 5 mg by mouth daily.     Marland Kitchen atorvastatin (LIPITOR) 20 MG tablet Take 20 mg by mouth daily.  11  . benzonatate (TESSALON) 100 MG capsule Take 1 capsule (100 mg total) by mouth every 8 (eight) hours. 30 capsule  0  . Blood Glucose Calibration (GLUCOSE CONTROL) SOLN 1 Bottle by In Vitro route as needed. Use to calibrate Accu-Chek Aviva Plus device prn; please provide control solution compatible with Accu-Chek Aviva Plus device. (Patient taking differently: 1 each by Other route as needed (Use to calibrate glucometer). E11.9) 1 each 1  . Blood Glucose Monitoring Suppl (ACCU-CHEK AVIVA PLUS) w/Device KIT 1 each by Does not apply route 2 (two) times daily. Use to monitor glucose levels BID; E11.9 (Patient taking differently: 1 each by Does not apply route 2 (two) times daily. E11.9) 1 kit 0  . bromocriptine (PARLODEL) 2.5 MG tablet TAKE 1 TABLET BY MOUTH EVERY DAY 30 tablet 11  . cetirizine (ZYRTEC ALLERGY) 10 MG tablet Take 1 tablet (10 mg total) by mouth daily. 30 tablet 0  . chlorthalidone (HYGROTON) 25 MG tablet Take 12.5 mg by mouth daily.     . Dulaglutide (TRULICITY) 1.5 EY/8.1KG SOPN Inject 1.5 mg into the skin once a week. 4 pen 11  . escitalopram (LEXAPRO) 10 MG tablet Take 1 tablet (10 mg total) by mouth every morning. 90 tablet 3  . FARXIGA 10 MG TABS tablet TAKE 1 TABLET BY MOUTH EVERY DAY 30 tablet 11  . insulin aspart (NOVOLOG) 100 UNIT/ML injection FOR USE IN PUMP, TOTAL OF  70 UNITS PER DAY 30 mL 8  . Insulin Disposable Pump (V-GO 30) KIT 1 Device by Does not apply route daily. 30 kit 11  . lisinopril  (PRINIVIL,ZESTRIL) 40 MG tablet Take 40 mg by mouth daily.    . metFORMIN (GLUCOPHAGE-XR) 500 MG 24 hr tablet Take 500 mg by mouth daily with breakfast.    . pantoprazole (PROTONIX) 40 MG tablet 1 PO 30 MINS BEFORE YOUR FIRST AND LAST MEAL. 60 tablet 11  . promethazine (PHENERGAN) 12.5 MG tablet 1-2 po q4-6 h prn nausea or vomiting 30 tablet 1  . traZODone (DESYREL) 100 MG tablet Take 2 tablets (200 mg total) by mouth at bedtime. 180 tablet 3  . YUVAFEM 10 MCG TABS vaginal tablet Place 10 mcg vaginally See admin instructions. Twice a week as needed for vaginal dryness  11  . fluticasone (FLONASE) 50 MCG/ACT nasal spray Place 1 spray into both nostrils daily for 14 days. 16 g 0   No current facility-administered medications on file prior to visit.    Allergies  Allergen Reactions  . Synthroid [Levothyroxine Sodium] Anaphylaxis  . Iodine Rash    I get a skin rash sometimes if iodine applied to my skin.    . Povidone-Iodine Itching and Rash    Family History  Problem Relation Age of Onset  . Asthma Other   . Diabetes Other   . Arthritis Other   . Cancer Mother   . Cancer Brother   . Anesthesia problems Neg Hx   . Hypotension Neg Hx   . Malignant hyperthermia Neg Hx   . Pseudochol deficiency Neg Hx   . Colon polyps Neg Hx   . Colon cancer Neg Hx   . Celiac disease Neg Hx   . Pancreatic cancer Neg Hx   . Stomach cancer Neg Hx   . Ulcerative colitis Neg Hx   . Crohn's disease Neg Hx     BP 136/76   Pulse (!) 58   Ht _0  (1.702 m)   Wt 239 lb 12.8 oz (108.8 kg)   SpO2 98%   BMI 37.56 kg/m    Review of Systems Denies nausea    Objective:  Physical Exam VITAL SIGNS:  See vs page GENERAL: no distress Pulses: dorsalis pedis intact bilat.   MSK: no deformity of the feet CV: no leg edema Skin:  no ulcer on the feet.  normal color and temp on the feet. Neuro: sensation is intact to touch on the feet   Lab Results  Component Value Date   HGBA1C 6.8 (A) 03/18/2020         Assessment & Plan:  Insulin-requiring type 2 DM: well-controlled.   Patient Instructions  Please continue the same diabetes medications.  check your blood sugar twice a day.  vary the time of day when you check, between before the 3 meals, and at bedtime.  also check if you have symptoms of your blood sugar being too high or too low.  please keep a record of the readings and bring it to your next appointment here (or you can bring the meter itself).  You can write it on any piece of paper.  please call us sooner if your blood sugar goes below 70, or if you have a lot of readings over 200. Please come back for a follow-up appointment in 3-4 months.

## 2020-04-25 ENCOUNTER — Other Ambulatory Visit: Payer: Self-pay | Admitting: Endocrinology

## 2020-04-25 MED ORDER — V-GO 30 KIT: PACK | 11 refills | Status: DC

## 2020-04-25 NOTE — Telephone Encounter (Signed)
Vgo order sent with information needed by pharmacy.

## 2020-04-25 NOTE — Telephone Encounter (Signed)
Medication Refill Request  Did you call your pharmacy and request this refill first? Yes  . If patient has not contacted pharmacy first, instruct them to do so for future refills.  . Remind them that contacting the pharmacy for their refill is the quickest method to get the refill.  . Refill policy also stated that it will take anywhere between 24-72 hours to receive the refill.    Name of medication? V-go 30  Is this a 90 day supply? No 30   Name and location of pharmacy?   [id number] - [place], [place] Phone:  [phone]  Fax:  [phone]

## 2020-04-29 ENCOUNTER — Telehealth: Payer: Self-pay | Admitting: Endocrinology

## 2020-04-29 NOTE — Telephone Encounter (Signed)
Spoke with patient and she has spoken with the insurance and will send a mychart message with additional information.

## 2020-04-29 NOTE — Telephone Encounter (Signed)
please contact patient: Ins declined V-GO.  Is your insurance different?  Do you want to try a different pharmacy?  If not, we'll need to change to insulin injections.

## 2020-05-15 ENCOUNTER — Other Ambulatory Visit: Payer: Self-pay | Admitting: Endocrinology

## 2020-06-10 ENCOUNTER — Ambulatory Visit (HOSPITAL_COMMUNITY)
Admission: RE | Admit: 2020-06-10 | Discharge: 2020-06-10 | Disposition: A | Discharge status: Home / Self Care | Payer: Medicare Other | Source: Ambulatory Visit | Attending: Internal Medicine | Admitting: Internal Medicine

## 2020-06-10 ENCOUNTER — Other Ambulatory Visit: Payer: Self-pay

## 2020-06-10 ENCOUNTER — Inpatient Hospital Stay: Payer: Medicare Other | Attending: Internal Medicine

## 2020-06-10 DIAGNOSIS — G4700 Insomnia, unspecified: Secondary | ICD-10-CM | POA: Insufficient documentation

## 2020-06-10 DIAGNOSIS — C3411 Malignant neoplasm of upper lobe, right bronchus or lung: Secondary | ICD-10-CM | POA: Diagnosis not present

## 2020-06-10 DIAGNOSIS — I10 Essential (primary) hypertension: Secondary | ICD-10-CM | POA: Insufficient documentation

## 2020-06-10 DIAGNOSIS — D3501 Benign neoplasm of right adrenal gland: Secondary | ICD-10-CM | POA: Diagnosis not present

## 2020-06-10 DIAGNOSIS — Z86010 Personal history of colonic polyps: Secondary | ICD-10-CM | POA: Insufficient documentation

## 2020-06-10 DIAGNOSIS — D3502 Benign neoplasm of left adrenal gland: Secondary | ICD-10-CM | POA: Diagnosis not present

## 2020-06-10 DIAGNOSIS — K219 Gastro-esophageal reflux disease without esophagitis: Secondary | ICD-10-CM | POA: Insufficient documentation

## 2020-06-10 DIAGNOSIS — E785 Hyperlipidemia, unspecified: Secondary | ICD-10-CM | POA: Diagnosis not present

## 2020-06-10 DIAGNOSIS — E119 Type 2 diabetes mellitus without complications: Secondary | ICD-10-CM | POA: Diagnosis not present

## 2020-06-10 DIAGNOSIS — C3490 Malignant neoplasm of unspecified part of unspecified bronchus or lung: Secondary | ICD-10-CM

## 2020-06-10 DIAGNOSIS — E079 Disorder of thyroid, unspecified: Secondary | ICD-10-CM | POA: Insufficient documentation

## 2020-06-10 DIAGNOSIS — F329 Major depressive disorder, single episode, unspecified: Secondary | ICD-10-CM | POA: Insufficient documentation

## 2020-06-10 LAB — CBC WITH DIFFERENTIAL (CANCER CENTER ONLY)
Abs Immature Granulocytes: 0.03 10*3/uL (ref 0.00–0.07)
Basophils Absolute: 0 10*3/uL (ref 0.0–0.1)
Basophils Relative: 1 %
Eosinophils Absolute: 0.5 10*3/uL (ref 0.0–0.5)
Eosinophils Relative: 5 %
HCT: 38.9 % (ref 36.0–46.0)
Hemoglobin: 12.1 g/dL (ref 12.0–15.0)
Immature Granulocytes: 0 %
Lymphocytes Relative: 27 %
Lymphs Abs: 2.3 10*3/uL (ref 0.7–4.0)
MCH: 23.6 pg — ABNORMAL LOW (ref 26.0–34.0)
MCHC: 31.1 g/dL (ref 30.0–36.0)
MCV: 76 fL — ABNORMAL LOW (ref 80.0–100.0)
Monocytes Absolute: 0.4 10*3/uL (ref 0.1–1.0)
Monocytes Relative: 5 %
Neutro Abs: 5.3 10*3/uL (ref 1.7–7.7)
Neutrophils Relative %: 62 %
Platelet Count: 238 10*3/uL (ref 150–400)
RBC: 5.12 MIL/uL — ABNORMAL HIGH (ref 3.87–5.11)
RDW: 16.7 % — ABNORMAL HIGH (ref 11.5–15.5)
WBC Count: 8.6 10*3/uL (ref 4.0–10.5)
nRBC: 0 % (ref 0.0–0.2)

## 2020-06-10 LAB — CMP (CANCER CENTER ONLY)
ALT: 15 U/L (ref 0–44)
AST: 18 U/L (ref 15–41)
Albumin: 3.7 g/dL (ref 3.5–5.0)
Alkaline Phosphatase: 68 U/L (ref 38–126)
Anion gap: 5 (ref 5–15)
BUN: 19 mg/dL (ref 8–23)
CO2: 30 mmol/L (ref 22–32)
Calcium: 9.1 mg/dL (ref 8.9–10.3)
Chloride: 106 mmol/L (ref 98–111)
Creatinine: 0.79 mg/dL (ref 0.44–1.00)
GFR, Estimated: >60 mL/min (ref >60)
Glucose, Bld: 92 mg/dL (ref 70–99)
Potassium: 3.9 mmol/L (ref 3.5–5.1)
Sodium: 141 mmol/L (ref 135–145)
Total Bilirubin: 0.7 mg/dL (ref 0.3–1.2)
Total Protein: 6.8 g/dL (ref 6.5–8.1)

## 2020-06-10 MED ORDER — IOHEXOL 300 MG/ML  SOLN
75.0000 mL | Freq: Once | INTRAMUSCULAR | Status: AC | PRN
  Administered 2020-06-10: 75 mL via INTRAVENOUS

## 2020-06-13 ENCOUNTER — Inpatient Hospital Stay (HOSPITAL_BASED_OUTPATIENT_CLINIC_OR_DEPARTMENT_OTHER): Payer: Medicare Other | Admitting: Internal Medicine

## 2020-06-13 ENCOUNTER — Telehealth: Payer: Self-pay | Admitting: Internal Medicine

## 2020-06-13 ENCOUNTER — Other Ambulatory Visit: Payer: Self-pay

## 2020-06-13 ENCOUNTER — Encounter: Payer: Self-pay | Admitting: Internal Medicine

## 2020-06-13 VITALS — BP 154/72 | HR 60 | Temp 96.5°F | Resp 17 | Ht 67.0 in | Wt 240.0 lb

## 2020-06-13 DIAGNOSIS — C3411 Malignant neoplasm of upper lobe, right bronchus or lung: Secondary | ICD-10-CM | POA: Diagnosis not present

## 2020-06-13 DIAGNOSIS — C3491 Malignant neoplasm of unspecified part of right bronchus or lung: Secondary | ICD-10-CM

## 2020-06-13 DIAGNOSIS — C3490 Malignant neoplasm of unspecified part of unspecified bronchus or lung: Secondary | ICD-10-CM | POA: Diagnosis not present

## 2020-06-13 DIAGNOSIS — I10 Essential (primary) hypertension: Secondary | ICD-10-CM | POA: Diagnosis not present

## 2020-06-13 NOTE — Progress Notes (Signed)
[facility] Telephone:([phone]) [phone]   Fax:(336) [phone]  OFFICE PROGRESS NOTE  Koir[place], [place], MD [address] [place] [address]  DIAGNOSIS: Stage IA (T1a, N0, M0) non-small cell lung cancer, adenocarcinoma presented with right upper lobe pulmonary nodule   PRIOR THERAPY: S/P right VATS with right upper lobectomy and mediastinal lymph node dissection under the care of Dr. Roxan Hockey on October 15, 2016.  CURRENT THERAPY: Observation.  INTERVAL HISTORY: [name] 64 y.o. female returns to the clinic today for 6 months follow-up visit.  The patient is feeling fine today with no concerning complaints.  She denied having any current chest pain, shortness breath, cough or hemoptysis.  She denied having any fever or chills.  She has no nausea, vomiting, diarrhea or constipation.  She denied having any headache or visual changes.  She has no concerning weight loss or night sweats.  The patient had repeat CT scan of the chest performed recently and she is here for evaluation and discussion of her risk her results.   MEDICAL HISTORY: Past Medical History:  Diagnosis Date  . Adenocarcinoma of right lung, stage 1 ([place]) 11/29/2016  . Cancer ([place])   . Chronic pain   . Depression    takes Lexapro daily  . Diabetes mellitus    takes Metformin daily and has an insulin pump.Fasting blood sugar runs250  . GERD (gastroesophageal reflux disease)    takes Pantoprazole daily  . History of colon polyps    benign  . Hyperlipidemia    takes Atorvastatin daily  . Hypertension    takes Amlodipine and Lisinopril daily  . Insomnia    takes Trazodone nightly  . Nocturia   . Thyroid disease   . Urinary frequency   . Urinary urgency     ALLERGIES:  is allergic to synthroid [levothyroxine sodium], iodine, and povidone-iodine.  MEDICATIONS:  Current Outpatient Medications  Medication Sig Dispense Refill  . ACCU-CHEK AVIVA PLUS test strip USE TO MONITOR  GLUCOSE LEVELS TWICE A DAY E11.9 (Patient taking differently: 1 each by Other route 2 (two) times daily. E11.9) 100 strip 12  . ACCU-CHEK SOFTCLIX LANCETS lancets Use to monitor glucose levels BID; E11.9 (Patient taking differently: 1 each by Other route 2 (two) times daily. E11.9) 100 each 12  . acetaminophen (TYLENOL) 500 MG tablet Take 1 tablet (500 mg total) by mouth every 6 (six) hours as needed. (Patient taking differently: Take 500 mg by mouth every 6 (six) hours as needed (for pain). ) 30 tablet 0  . amLODipine (NORVASC) 5 MG tablet Take 5 mg by mouth daily.     Marland Kitchen atorvastatin (LIPITOR) 20 MG tablet Take 20 mg by mouth daily.  11  . benzonatate (TESSALON) 100 MG capsule Take 1 capsule (100 mg total) by mouth every 8 (eight) hours. 30 capsule 0  . Blood Glucose Calibration (GLUCOSE CONTROL) SOLN 1 Bottle by In Vitro route as needed. Use to calibrate Accu-Chek Aviva Plus device prn; please provide control solution compatible with Accu-Chek Aviva Plus device. (Patient taking differently: 1 each by Other route as needed (Use to calibrate glucometer). E11.9) 1 each 1  . Blood Glucose Monitoring Suppl (ACCU-CHEK AVIVA PLUS) w/Device KIT 1 each by Does not apply route 2 (two) times daily. Use to monitor glucose levels BID; E11.9 (Patient taking differently: 1 each by Does not apply route 2 (two) times daily. E11.9) 1 kit 0  . bromocriptine (PARLODEL) 2.5 MG tablet TAKE 1  TABLET BY MOUTH EVERY DAY 30 tablet 11  . cetirizine (ZYRTEC ALLERGY) 10 MG tablet Take 1 tablet (10 mg total) by mouth daily. 30 tablet 0  . chlorthalidone (HYGROTON) 25 MG tablet Take 12.5 mg by mouth daily.     . Dulaglutide (TRULICITY) 1.5 YY/5.0PT SOPN Inject 1.5 mg into the skin once a week. 4 pen 11  . escitalopram (LEXAPRO) 10 MG tablet Take 1 tablet (10 mg total) by mouth every morning. 90 tablet 3  . FARXIGA 10 MG TABS tablet TAKE 1 TABLET BY MOUTH EVERY DAY 30 tablet 11  . fluticasone (FLONASE) 50 MCG/ACT nasal spray Place  1 spray into both nostrils daily for 14 days. 16 g 0  . insulin aspart (NOVOLOG) 100 UNIT/ML injection FOR USE IN PUMP, TOTAL OF  70 UNITS PER DAY 30 mL 8  . Insulin Disposable Pump (V-GO 30) KIT 70 units daily as needed DX E11.9, Z79.4 30 kit 11  . lisinopril (PRINIVIL,ZESTRIL) 40 MG tablet Take 40 mg by mouth daily.    . metFORMIN (GLUCOPHAGE-XR) 500 MG 24 hr tablet TAKE 1 TABLET BY MOUTH 2 TIMES DAILY. 60 tablet 11  . pantoprazole (PROTONIX) 40 MG tablet 1 PO 30 MINS BEFORE YOUR FIRST AND LAST MEAL. 60 tablet 11  . promethazine (PHENERGAN) 12.5 MG tablet 1-2 po q4-6 h prn nausea or vomiting 30 tablet 1  . traZODone (DESYREL) 100 MG tablet Take 2 tablets (200 mg total) by mouth at bedtime. 180 tablet 3  . YUVAFEM 10 MCG TABS vaginal tablet Place 10 mcg vaginally See admin instructions. Twice a week as needed for vaginal dryness  11   No current facility-administered medications for this visit.    SURGICAL HISTORY:  Past Surgical History:  Procedure Laterality Date  . ABDOMINAL HYSTERECTOMY    . ABDOMINAL SURGERY    . BIOPSY  05/28/2019   Procedure: BIOPSY;  Surgeon: Danie Binder, MD;  [facility];  Service: Endoscopy;;  random colon/gastric/duodenum  . COLONOSCOPY  2005 MAC   TICs, IH  . COLONOSCOPY N/A 03/08/2014   Procedure: COLONOSCOPY;  Surgeon: Danie Binder, MD;  [facility];  Service: Endoscopy;  Laterality: N/A;  1015  . COLONOSCOPY WITH PROPOFOL N/A 05/28/2019   Procedure: COLONOSCOPY WITH PROPOFOL;  Surgeon: Danie Binder, MD;  [facility];  Service: Endoscopy;  Laterality: N/A;  9:15am  . CYSTOSTOMY  11/22/2011   Procedure: CYSTOSTOMY SUPRAPUBIC;  Surgeon: Marissa Nestle, MD;  [facility];  Service: Urology;  Laterality: N/A;  . ESOPHAGOGASTRODUODENOSCOPY N/A 03/08/2014   Procedure: ESOPHAGOGASTRODUODENOSCOPY (EGD);  Surgeon: Danie Binder, MD;  [facility];  Service: Endoscopy;  Laterality: N/A;  .  ESOPHAGOGASTRODUODENOSCOPY    . ESOPHAGOGASTRODUODENOSCOPY (EGD) WITH PROPOFOL N/A 05/28/2019   Procedure: ESOPHAGOGASTRODUODENOSCOPY (EGD) WITH PROPOFOL;  Surgeon: Danie Binder, MD;  [facility];  Service: Endoscopy;  Laterality: N/A;  . GIVENS CAPSULE STUDY N/A 06/15/2019   Procedure: GIVENS CAPSULE STUDY;  Surgeon: Danie Binder, MD;  [facility];  Service: Endoscopy;  Laterality: N/A;  7:30am  . HALLUX VALGUS CORRECTION     bilateral foot surgery for bone repairs-multiple  . LOBECTOMY Right 10/15/2016   Procedure: RIGHT UPPER LOBECTOMY;  Surgeon: [name], MD;  Location: [facility];  Service: Thoracic;  Laterality: Right;  . LYMPH NODE DISSECTION Right 10/15/2016   Procedure: LYMPH NODE DISSECTION;  Surgeon: [name], MD;  Location: [facility];  Service: Thoracic;  Laterality: Right;  . POLYPECTOMY  05/28/2019   Procedure: POLYPECTOMY;  Surgeon: Danie Binder, MD;  [facility];  Service: Endoscopy;;  . SAVORY DILATION N/A 05/28/2019   Procedure: SAVORY DILATION;  Surgeon: Danie Binder, MD;  [facility];  Service: Endoscopy;  Laterality: N/A;  15/16/17  . VAGINA RECONSTRUCTION SURGERY     tvt 2006- [place]  . VIDEO ASSISTED THORACOSCOPY (VATS)/WEDGE RESECTION Right 10/15/2016   Procedure: RIGHT VIDEO ASSISTED THORACOSCOPY (VATS)/WEDGE RESECTION;  Surgeon: [name], MD;  Location: [facility];  Service: Thoracic;  Laterality: Right;    REVIEW OF SYSTEMS:  A comprehensive review of systems was negative.   PHYSICAL EXAMINATION: General appearance: alert, cooperative and no distress Head: Normocephalic, without obvious abnormality, atraumatic Neck: no adenopathy, no JVD, supple, symmetrical, trachea midline and thyroid not enlarged, symmetric, no tenderness/mass/nodules Lymph nodes: Cervical, supraclavicular, and axillary nodes normal. Resp: clear to auscultation bilaterally Back: symmetric, no curvature. ROM  normal. No CVA tenderness. Cardio: regular rate and rhythm, S1, S2 normal, no murmur, click, rub or gallop GI: soft, non-tender; bowel sounds normal; no masses,  no organomegaly Extremities: extremities normal, atraumatic, no cyanosis or edema  ECOG PERFORMANCE STATUS: 0 - Asymptomatic  Blood pressure (!) 154/72, pulse 60, temperature (!) 96.5 F (35.8 C), temperature source Tympanic, resp. rate 17, height _0  (1.702 m), weight 240 lb (108.9 kg), SpO2 98 %.  LABORATORY DATA: Lab Results  Component Value Date   WBC 8.6 06/10/2020   HGB 12.1 06/10/2020   HCT 38.9 06/10/2020   MCV 76.0 (L) 06/10/2020   PLT 238 06/10/2020      Chemistry      Component Value Date/Time   NA 141 06/10/2020 1322   NA 141 05/29/2017 0921   K 3.9 06/10/2020 1322   K 4.8 05/29/2017 0921   CL 106 06/10/2020 1322   CO2 30 06/10/2020 1322   CO2 28 05/29/2017 0921   BUN 19 06/10/2020 1322   BUN 13.5 05/29/2017 0921   CREATININE 0.79 06/10/2020 1322   CREATININE 0.8 05/29/2017 0921      Component Value Date/Time   CALCIUM 9.1 06/10/2020 1322   CALCIUM 9.5 05/29/2017 0921   ALKPHOS 68 06/10/2020 1322   ALKPHOS 104 05/29/2017 0921   AST 18 06/10/2020 1322   AST 17 05/29/2017 0921   ALT 15 06/10/2020 1322   ALT 20 05/29/2017 0921   BILITOT 0.7 06/10/2020 1322   BILITOT 0.40 05/29/2017 0921       RADIOGRAPHIC STUDIES: CT Chest W Contrast  Result Date: 06/11/2020 CLINICAL DATA:  Follow-up right lung non-small cell carcinoma. Previous right upper lobectomy. EXAM: CT CHEST WITH CONTRAST TECHNIQUE: Multidetector CT imaging of the chest was performed during intravenous contrast administration. CONTRAST:  26m OMNIPAQUE IOHEXOL 300 MG/ML  SOLN COMPARISON:  12/08/2019 FINDINGS: Cardiovascular: No acute findings. Aortic atherosclerotic calcification noted. Mediastinum/Nodes: No masses or pathologically enlarged lymph nodes identified. Previously noted 12 mm right axillary lymph node has decreased in size,  currently measuring 8 mm. Lungs/Pleura: Stable postop changes from previous right upper lobectomy. No suspicious nodules or masses identified. No evidence of pulmonary infiltrate or pleural effusion. Upper Abdomen: Stable small bilateral adrenal nodules, consistent with benign adenomas. Musculoskeletal:  No suspicious bone lesions. IMPRESSION: Stable postop changes from previous right upper lobectomy. No evidence of recurrent or metastatic carcinoma within the thorax. Resolution of mild right axillary lymphadenopathy since prior study. Stable small benign bilateral adrenal adenomas. Aortic Atherosclerosis (  ICD10-I70.0). Electronically Signed   By: Marlaine Hind M.D.   On: 06/11/2020 14:25    ASSESSMENT AND PLAN: This is a very pleasant 64 years old white female with stage Ia non-small cell lung cancer status post right upper lobectomy with lymph node dissection.  This was diagnosed in March 2018. She has been on observation for almost 4 years now with no concerning issues.  She had repeat CT scan of the chest performed recently.  I personally and independently reviewed the scans and discussed the results with the patient today. Her scan showed no evidence for recurrent or metastatic disease and there was resolution of the mild right axillary lymph nodes from the previous scans that were likely secondary to her Covid vaccine at that time. The patient will come back for follow-up visit in 1 year for evaluation with repeat blood work and scan. For the hypertension she was advised to take her blood pressure medication as prescribed and to monitor it closely at home. She was advised to call immediately if she has any concerning symptoms in the interval. The patient voices understanding of current disease status and treatment options and is in agreement with the current care plan. All questions were answered. The patient knows to call the clinic with any problems, questions or concerns. We can certainly see the  patient much sooner if necessary.  Disclaimer: This note was dictated with voice recognition software. Similar sounding words can inadvertently be transcribed and may not be corrected upon review.

## 2020-06-13 NOTE — Telephone Encounter (Signed)
Scheduled per los. Declined printout  

## 2020-06-17 ENCOUNTER — Ambulatory Visit (INDEPENDENT_AMBULATORY_CARE_PROVIDER_SITE_OTHER): Payer: Medicare Other | Admitting: Endocrinology

## 2020-06-17 ENCOUNTER — Other Ambulatory Visit: Payer: Self-pay

## 2020-06-17 ENCOUNTER — Encounter: Payer: Self-pay | Admitting: Endocrinology

## 2020-06-17 VITALS — BP 144/82 | HR 68 | Ht 67.0 in | Wt 239.0 lb

## 2020-06-17 DIAGNOSIS — E119 Type 2 diabetes mellitus without complications: Secondary | ICD-10-CM | POA: Diagnosis not present

## 2020-06-17 DIAGNOSIS — Z794 Long term (current) use of insulin: Secondary | ICD-10-CM | POA: Diagnosis not present

## 2020-06-17 LAB — POCT GLYCOSYLATED HEMOGLOBIN (HGB A1C): Hemoglobin A1C: 6.3 % — AB (ref 4.0–5.6)

## 2020-06-17 NOTE — Progress Notes (Signed)
Subjective:    Patient ID: [name], female    DOB: 1956/04/15, 64 y.o.   MRN: [id number]  HPI Pt returns for f/u of diabetes mellitus: DM type: Insulin-requiring type 2.   Dx'ed: 3220 Complications: none Therapy: insulin since soon after dx (now V-GO-30), 3 oral meds, and Trulicity.   GDM: never (G0) DKA: never Severe hypoglycemia: never.   Pancreatitis: never.  Other: she stopped victoza, due to lack of effect; med dosages (esp Trulicity) are limited by nausea.   Interval history: She takes approx 25-42 clicks per day, via the V-GO-30.  she brings her meter with her cbg's which I have reviewed today.  cbg varies from 79-192.  There is no trend throughout the day. She seldom has hypoglycemia, and these episodes are mild.  Past Medical History:  Diagnosis Date  . Adenocarcinoma of right lung, stage 1 ([place]) 11/29/2016  . Cancer ([place])   . Chronic pain   . Depression    takes Lexapro daily  . Diabetes mellitus    takes Metformin daily and has an insulin pump.Fasting blood sugar runs250  . GERD (gastroesophageal reflux disease)    takes Pantoprazole daily  . History of colon polyps    benign  . Hyperlipidemia    takes Atorvastatin daily  . Hypertension    takes Amlodipine and Lisinopril daily  . Insomnia    takes Trazodone nightly  . Nocturia   . Thyroid disease   . Urinary frequency   . Urinary urgency     Past Surgical History:  Procedure Laterality Date  . ABDOMINAL HYSTERECTOMY    . ABDOMINAL SURGERY    . BIOPSY  05/28/2019   Procedure: BIOPSY;  Surgeon: Danie Binder, MD;  [facility];  Service: Endoscopy;;  random colon/gastric/duodenum  . COLONOSCOPY  2005 MAC   TICs, IH  . COLONOSCOPY N/A 03/08/2014   Procedure: COLONOSCOPY;  Surgeon: Danie Binder, MD;  [facility];  Service: Endoscopy;  Laterality: N/A;  1015  . COLONOSCOPY WITH PROPOFOL N/A 05/28/2019   Procedure: COLONOSCOPY WITH PROPOFOL;  Surgeon: Danie Binder, MD;   [facility];  Service: Endoscopy;  Laterality: N/A;  9:15am  . CYSTOSTOMY  11/22/2011   Procedure: CYSTOSTOMY SUPRAPUBIC;  Surgeon: Marissa Nestle, MD;  [facility];  Service: Urology;  Laterality: N/A;  . ESOPHAGOGASTRODUODENOSCOPY N/A 03/08/2014   Procedure: ESOPHAGOGASTRODUODENOSCOPY (EGD);  Surgeon: Danie Binder, MD;  [facility];  Service: Endoscopy;  Laterality: N/A;  . ESOPHAGOGASTRODUODENOSCOPY    . ESOPHAGOGASTRODUODENOSCOPY (EGD) WITH PROPOFOL N/A 05/28/2019   Procedure: ESOPHAGOGASTRODUODENOSCOPY (EGD) WITH PROPOFOL;  Surgeon: Danie Binder, MD;  [facility];  Service: Endoscopy;  Laterality: N/A;  . GIVENS CAPSULE STUDY N/A 06/15/2019   Procedure: GIVENS CAPSULE STUDY;  Surgeon: Danie Binder, MD;  [facility];  Service: Endoscopy;  Laterality: N/A;  7:30am  . HALLUX VALGUS CORRECTION     bilateral foot surgery for bone repairs-multiple  . LOBECTOMY Right 10/15/2016   Procedure: RIGHT UPPER LOBECTOMY;  Surgeon: [name], MD;  Location: [facility];  Service: Thoracic;  Laterality: Right;  . LYMPH NODE DISSECTION Right 10/15/2016   Procedure: LYMPH NODE DISSECTION;  Surgeon: [name], MD;  Location: [facility];  Service: Thoracic;  Laterality: Right;  . POLYPECTOMY  05/28/2019   Procedure: POLYPECTOMY;  Surgeon: Danie Binder, MD;  [facility];  Service: Endoscopy;;  . SAVORY DILATION N/A  05/28/2019   Procedure: SAVORY DILATION;  Surgeon: Danie Binder, MD;  [facility];  Service: Endoscopy;  Laterality: N/A;  15/16/17  . VAGINA RECONSTRUCTION SURGERY     tvt 2006- [facility]  . VIDEO ASSISTED THORACOSCOPY (VATS)/WEDGE RESECTION Right 10/15/2016   Procedure: RIGHT VIDEO ASSISTED THORACOSCOPY (VATS)/WEDGE RESECTION;  Surgeon: [name], MD;  Location: [facility];  Service: Thoracic;  Laterality: Right;    Social History   Socioeconomic History  . Marital status: Single     Spouse name: Not on file  . Number of children: Not on file  . Years of education: Not on file  . Highest education level: Not on file  Occupational History  . Not on file  Tobacco Use  . Smoking status: Former Smoker    Packs/day: 1.00    Years: 32.00    Pack years: 32.00    Types: Cigarettes  . Smokeless tobacco: Never Used  . Tobacco comment: quit smoking 20 yrs ago  Vaping Use  . Vaping Use: Never used  Substance and Sexual Activity  . Alcohol use: No    Comment: rarely  . Drug use: No  . Sexual activity: Not on file  Other Topics Concern  . Not on file  Social History Narrative  . Not on file   Social Determinants of Health   Financial Resource Strain:   . Difficulty of Paying Living Expenses: Not on file  Food Insecurity:   . Worried About Charity fundraiser in the Last Year: Not on file  . Ran Out of Food in the Last Year: Not on file  Transportation Needs:   . Lack of Transportation (Medical): Not on file  . Lack of Transportation (Non-Medical): Not on file  Physical Activity:   . Days of Exercise per Week: Not on file  . Minutes of Exercise per Session: Not on file  Stress:   . Feeling of Stress : Not on file  Social Connections:   . Frequency of Communication with Friends and Family: Not on file  . Frequency of Social Gatherings with Friends and Family: Not on file  . Attends Religious Services: Not on file  . Active Member of Clubs or Organizations: Not on file  . Attends Archivist Meetings: Not on file  . Marital Status: Not on file  Intimate Partner Violence:   . Fear of Current or Ex-Partner: Not on file  . Emotionally Abused: Not on file  . Physically Abused: Not on file  . Sexually Abused: Not on file    Current Outpatient Medications on File Prior to Visit  Medication Sig Dispense Refill  . ACCU-CHEK AVIVA PLUS test strip USE TO MONITOR GLUCOSE LEVELS TWICE A DAY E11.9 (Patient taking differently: 1 each by Other route 2 (two)  times daily. E11.9) 100 strip 12  . ACCU-CHEK SOFTCLIX LANCETS lancets Use to monitor glucose levels BID; E11.9 (Patient taking differently: 1 each by Other route 2 (two) times daily. E11.9) 100 each 12  . acetaminophen (TYLENOL) 500 MG tablet Take 1 tablet (500 mg total) by mouth every 6 (six) hours as needed. (Patient taking differently: Take 500 mg by mouth every 6 (six) hours as needed (for pain). ) 30 tablet 0  . atorvastatin (LIPITOR) 20 MG tablet Take 20 mg by mouth daily.  11  . benzonatate (TESSALON) 100 MG capsule Take 1 capsule (100 mg total) by mouth every 8 (eight) hours. 30 capsule 0  . Blood Glucose Calibration (  GLUCOSE CONTROL) SOLN 1 Bottle by In Vitro route as needed. Use to calibrate Accu-Chek Aviva Plus device prn; please provide control solution compatible with Accu-Chek Aviva Plus device. (Patient taking differently: 1 each by Other route as needed (Use to calibrate glucometer). E11.9) 1 each 1  . Blood Glucose Monitoring Suppl (ACCU-CHEK AVIVA PLUS) w/Device KIT 1 each by Does not apply route 2 (two) times daily. Use to monitor glucose levels BID; E11.9 (Patient taking differently: 1 each by Does not apply route 2 (two) times daily. E11.9) 1 kit 0  . bromocriptine (PARLODEL) 2.5 MG tablet TAKE 1 TABLET BY MOUTH EVERY DAY 30 tablet 11  . cetirizine (ZYRTEC ALLERGY) 10 MG tablet Take 1 tablet (10 mg total) by mouth daily. 30 tablet 0  . chlorthalidone (HYGROTON) 25 MG tablet Take 12.5 mg by mouth daily.     . Dulaglutide (TRULICITY) 1.5 DH/6.8SH SOPN Inject 1.5 mg into the skin once a week. 4 pen 11  . escitalopram (LEXAPRO) 10 MG tablet Take 1 tablet (10 mg total) by mouth every morning. 90 tablet 3  . FARXIGA 10 MG TABS tablet TAKE 1 TABLET BY MOUTH EVERY DAY 30 tablet 11  . insulin aspart (NOVOLOG) 100 UNIT/ML injection FOR USE IN PUMP, TOTAL OF  70 UNITS PER DAY 30 mL 8  . Insulin Disposable Pump (V-GO 30) KIT 70 units daily as needed DX E11.9, Z79.4 30 kit 11  . lisinopril  (ZESTRIL) 10 MG tablet Take 10 mg by mouth daily.    . metFORMIN (GLUCOPHAGE-XR) 500 MG 24 hr tablet TAKE 1 TABLET BY MOUTH 2 TIMES DAILY. 60 tablet 11  . pantoprazole (PROTONIX) 40 MG tablet 1 PO 30 MINS BEFORE YOUR FIRST AND LAST MEAL. 60 tablet 11  . promethazine (PHENERGAN) 12.5 MG tablet 1-2 po q4-6 h prn nausea or vomiting 30 tablet 1  . traZODone (DESYREL) 100 MG tablet Take 2 tablets (200 mg total) by mouth at bedtime. 180 tablet 3  . YUVAFEM 10 MCG TABS vaginal tablet Place 10 mcg vaginally See admin instructions. Twice a week as needed for vaginal dryness  11  . fluticasone (FLONASE) 50 MCG/ACT nasal spray Place 1 spray into both nostrils daily for 14 days. 16 g 0   No current facility-administered medications on file prior to visit.    Allergies  Allergen Reactions  . Synthroid [Levothyroxine Sodium] Anaphylaxis  . Iodine Rash    I get a skin rash sometimes if iodine applied to my skin.    . Povidone-Iodine Itching and Rash    Family History  Problem Relation Age of Onset  . Asthma Other   . Diabetes Other   . Arthritis Other   . Cancer Mother   . Cancer Brother   . Anesthesia problems Neg Hx   . Hypotension Neg Hx   . Malignant hyperthermia Neg Hx   . Pseudochol deficiency Neg Hx   . Colon polyps Neg Hx   . Colon cancer Neg Hx   . Celiac disease Neg Hx   . Pancreatic cancer Neg Hx   . Stomach cancer Neg Hx   . Ulcerative colitis Neg Hx   . Crohn's disease Neg Hx     BP (!) 144/82   Pulse 68   Ht _0  (1.702 m)   Wt 239 lb (108.4 kg)   SpO2 99%   BMI 37.43 kg/m    Review of Systems Denies n/v    Objective:   Physical Exam VITAL SIGNS:  See  vs page GENERAL: no distress Pulses: dorsalis pedis intact bilat.   MSK: no deformity of the feet CV: no leg edema Skin:  no ulcer on the feet.  normal color and temp on the feet. Neuro: sensation is intact to touch on the feet   Lab Results  Component Value Date   HGBA1C 6.3 (A) 06/17/2020        Assessment & Plan:  Insulin-requiring type 2 DM.   Hypoglycemia, due to insulin: She declines to reduce to V-GO-20  Patient Instructions  Your blood pressure is high today.  Please see your primary care provider soon, to have it rechecked Please continue the same diabetes medications.  check your blood sugar twice a day.  vary the time of day when you check, between before the 3 meals, and at bedtime.  also check if you have symptoms of your blood sugar being too high or too low.  please keep a record of the readings and bring it to your next appointment here (or you can bring the meter itself).  You can write it on any piece of paper.  please call us sooner if your blood sugar goes below 70, or if you have a lot of readings over 200. Please come back for a follow-up appointment in 3-4 months.

## 2020-06-17 NOTE — Patient Instructions (Addendum)
Your blood pressure is high today.  Please see your primary care provider soon, to have it rechecked Please continue the same diabetes medications.  check your blood sugar twice a day.  vary the time of day when you check, between before the 3 meals, and at bedtime.  also check if you have symptoms of your blood sugar being too high or too low.  please keep a record of the readings and bring it to your next appointment here (or you can bring the meter itself).  You can write it on any piece of paper.  please call us sooner if your blood sugar goes below 70, or if you have a lot of readings over 200. Please come back for a follow-up appointment in 3-4 months.

## 2020-06-21 ENCOUNTER — Ambulatory Visit: Payer: Medicare Other | Admitting: Endocrinology

## 2020-07-07 ENCOUNTER — Ambulatory Visit: Payer: Medicare Other | Admitting: Nurse Practitioner

## 2020-09-15 ENCOUNTER — Other Ambulatory Visit: Payer: Self-pay | Admitting: Endocrinology

## 2020-09-15 DIAGNOSIS — E119 Type 2 diabetes mellitus without complications: Secondary | ICD-10-CM

## 2020-09-15 DIAGNOSIS — Z794 Long term (current) use of insulin: Secondary | ICD-10-CM

## 2020-09-21 ENCOUNTER — Ambulatory Visit: Payer: Medicare Other | Admitting: Endocrinology

## 2020-10-05 ENCOUNTER — Encounter (HOSPITAL_COMMUNITY): Payer: Self-pay | Admitting: Psychiatry

## 2020-10-05 ENCOUNTER — Other Ambulatory Visit: Payer: Self-pay

## 2020-10-05 ENCOUNTER — Telehealth (INDEPENDENT_AMBULATORY_CARE_PROVIDER_SITE_OTHER): Payer: Medicare Other | Admitting: Psychiatry

## 2020-10-05 DIAGNOSIS — F331 Major depressive disorder, recurrent, moderate: Secondary | ICD-10-CM

## 2020-10-05 MED ORDER — TRAZODONE HCL 100 MG PO TABS
200.0000 mg | ORAL_TABLET | Freq: Every day | ORAL | 3 refills | Status: DC
Start: 2020-10-05 — End: 2021-04-21

## 2020-10-05 MED ORDER — ESCITALOPRAM OXALATE 10 MG PO TABS
10.0000 mg | ORAL_TABLET | Freq: Every morning | ORAL | 3 refills | Status: DC
Start: 2020-10-05 — End: 2021-04-21
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
## 2020-10-20 ENCOUNTER — Ambulatory Visit (INDEPENDENT_AMBULATORY_CARE_PROVIDER_SITE_OTHER): Payer: Medicare Other | Admitting: Endocrinology

## 2020-10-20 ENCOUNTER — Other Ambulatory Visit: Payer: Self-pay

## 2020-10-20 VITALS — BP 130/82 | HR 88 | Ht 67.0 in | Wt 234.2 lb

## 2020-10-20 DIAGNOSIS — Z794 Long term (current) use of insulin: Secondary | ICD-10-CM | POA: Diagnosis not present

## 2020-10-20 DIAGNOSIS — E119 Type 2 diabetes mellitus without complications: Secondary | ICD-10-CM

## 2020-10-20 LAB — POCT GLYCOSYLATED HEMOGLOBIN (HGB A1C): Hemoglobin A1C: 6.7 % — AB (ref 4.0–5.6)

## 2020-10-20 NOTE — Progress Notes (Signed)
Subjective:    Patient ID: [name], female    DOB: 1955-11-09, 65 y.o.   MRN: [id number]  HPI Pt returns for f/u of diabetes mellitus: DM type: Insulin-requiring type 2.   Dx'ed: 0349 Complications: none Therapy: insulin since soon after dx (now V-GO-30), 3 oral meds, and Trulicity.   GDM: never (G0) DKA: never Severe hypoglycemia: never.   Pancreatitis: never.  Other: she stopped victoza, due to lack of effect; med dosages (esp Trulicity) are limited by nausea.   Interval history: She takes approx 17-91 clicks per day, via the V-GO-30.  Meter is downloaded today, and the printout is scanned into the record. cbg varies from 79-193.  There is no trend throughout the day. She seldom has hypoglycemia, and these episodes are mild.   Past Medical History:  Diagnosis Date  . Adenocarcinoma of right lung, stage 1 ([place]) 11/29/2016  . Cancer ([place])   . Chronic pain   . Depression    takes Lexapro daily  . Diabetes mellitus    takes Metformin daily and has an insulin pump.Fasting blood sugar runs250  . GERD (gastroesophageal reflux disease)    takes Pantoprazole daily  . History of colon polyps    benign  . Hyperlipidemia    takes Atorvastatin daily  . Hypertension    takes Amlodipine and Lisinopril daily  . Insomnia    takes Trazodone nightly  . Nocturia   . Thyroid disease   . Urinary frequency   . Urinary urgency     Past Surgical History:  Procedure Laterality Date  . ABDOMINAL HYSTERECTOMY    . ABDOMINAL SURGERY    . BIOPSY  05/28/2019   Procedure: BIOPSY;  Surgeon: Danie Binder, MD;  [facility];  Service: Endoscopy;;  random colon/gastric/duodenum  . COLONOSCOPY  2005 MAC   TICs, IH  . COLONOSCOPY N/A 03/08/2014   Procedure: COLONOSCOPY;  Surgeon: Danie Binder, MD;  [facility];  Service: Endoscopy;  Laterality: N/A;  1015  . COLONOSCOPY WITH PROPOFOL N/A 05/28/2019   Procedure: COLONOSCOPY WITH PROPOFOL;  Surgeon: Danie Binder,  MD;  [facility];  Service: Endoscopy;  Laterality: N/A;  9:15am  . CYSTOSTOMY  11/22/2011   Procedure: CYSTOSTOMY SUPRAPUBIC;  Surgeon: Marissa Nestle, MD;  [facility];  Service: Urology;  Laterality: N/A;  . ESOPHAGOGASTRODUODENOSCOPY N/A 03/08/2014   Procedure: ESOPHAGOGASTRODUODENOSCOPY (EGD);  Surgeon: Danie Binder, MD;  [facility];  Service: Endoscopy;  Laterality: N/A;  . ESOPHAGOGASTRODUODENOSCOPY    . ESOPHAGOGASTRODUODENOSCOPY (EGD) WITH PROPOFOL N/A 05/28/2019   Procedure: ESOPHAGOGASTRODUODENOSCOPY (EGD) WITH PROPOFOL;  Surgeon: Danie Binder, MD;  [facility];  Service: Endoscopy;  Laterality: N/A;  . GIVENS CAPSULE STUDY N/A 06/15/2019   Procedure: GIVENS CAPSULE STUDY;  Surgeon: Danie Binder, MD;  [facility];  Service: Endoscopy;  Laterality: N/A;  7:30am  . HALLUX VALGUS CORRECTION     bilateral foot surgery for bone repairs-multiple  . LOBECTOMY Right 10/15/2016   Procedure: RIGHT UPPER LOBECTOMY;  Surgeon: [name], MD;  Location: [place];  Service: Thoracic;  Laterality: Right;  . LYMPH NODE DISSECTION Right 10/15/2016   Procedure: LYMPH NODE DISSECTION;  Surgeon: [name], MD;  Location: [place];  Service: Thoracic;  Laterality: Right;  . POLYPECTOMY  05/28/2019   Procedure: POLYPECTOMY;  Surgeon: Danie Binder, MD;  [facility];  Service: Endoscopy;;  . SAVORY DILATION N/A  05/28/2019   Procedure: SAVORY DILATION;  Surgeon: Danie Binder, MD;  [facility];  Service: Endoscopy;  Laterality: N/A;  15/16/17  . VAGINA RECONSTRUCTION SURGERY     tvt 2006- [place]  . VIDEO ASSISTED THORACOSCOPY (VATS)/WEDGE RESECTION Right 10/15/2016   Procedure: RIGHT VIDEO ASSISTED THORACOSCOPY (VATS)/WEDGE RESECTION;  Surgeon: [name], MD;  Location: [place];  Service: Thoracic;  Laterality: Right;    Social History   Socioeconomic History  . Marital status:  Single    Spouse name: Not on file  . Number of children: Not on file  . Years of education: Not on file  . Highest education level: Not on file  Occupational History  . Not on file  Tobacco Use  . Smoking status: Former Smoker    Packs/day: 1.00    Years: 32.00    Pack years: 32.00    Types: Cigarettes  . Smokeless tobacco: Never Used  . Tobacco comment: quit smoking 20 yrs ago  Vaping Use  . Vaping Use: Never used  Substance and Sexual Activity  . Alcohol use: No    Comment: rarely  . Drug use: No  . Sexual activity: Not on file  Other Topics Concern  . Not on file  Social History Narrative  . Not on file   Social Determinants of Health   Financial Resource Strain: Not on file  Food Insecurity: Not on file  Transportation Needs: Not on file  Physical Activity: Not on file  Stress: Not on file  Social Connections: Not on file  Intimate Partner Violence: Not on file    Current Outpatient Medications on File Prior to Visit  Medication Sig Dispense Refill  . ACCU-CHEK AVIVA PLUS test strip USE TO MONITOR GLUCOSE LEVELS TWICE A DAY E11.9 (Patient taking differently: 1 each by Other route 2 (two) times daily. E11.9) 100 strip 12  . ACCU-CHEK SOFTCLIX LANCETS lancets Use to monitor glucose levels BID; E11.9 (Patient taking differently: 1 each by Other route 2 (two) times daily. E11.9) 100 each 12  . acetaminophen (TYLENOL) 500 MG tablet Take 1 tablet (500 mg total) by mouth every 6 (six) hours as needed. (Patient taking differently: Take 500 mg by mouth every 6 (six) hours as needed (for pain).) 30 tablet 0  . atorvastatin (LIPITOR) 20 MG tablet Take 20 mg by mouth daily.  11  . benzonatate (TESSALON) 100 MG capsule Take 1 capsule (100 mg total) by mouth every 8 (eight) hours. 30 capsule 0  . Blood Glucose Calibration (GLUCOSE CONTROL) SOLN 1 Bottle by In Vitro route as needed. Use to calibrate Accu-Chek Aviva Plus device prn; please provide control solution compatible with  Accu-Chek Aviva Plus device. (Patient taking differently: 1 each by Other route as needed (Use to calibrate glucometer). E11.9) 1 each 1  . Blood Glucose Monitoring Suppl (ACCU-CHEK AVIVA PLUS) w/Device KIT 1 each by Does not apply route 2 (two) times daily. Use to monitor glucose levels BID; E11.9 (Patient taking differently: 1 each by Does not apply route 2 (two) times daily. E11.9) 1 kit 0  . bromocriptine (PARLODEL) 2.5 MG tablet TAKE 1 TABLET BY MOUTH EVERY DAY 30 tablet 11  . cetirizine (ZYRTEC ALLERGY) 10 MG tablet Take 1 tablet (10 mg total) by mouth daily. 30 tablet 0  . chlorthalidone (HYGROTON) 25 MG tablet Take 12.5 mg by mouth daily.     . Dulaglutide (TRULICITY) 1.5 BC/4.8GQ SOPN Inject 1.5 mg into the skin once a week. 4  pen 11  . escitalopram (LEXAPRO) 10 MG tablet Take 1 tablet (10 mg total) by mouth every morning. 90 tablet 3  . FARXIGA 10 MG TABS tablet TAKE 1 TABLET BY MOUTH EVERY DAY 30 tablet 11  . insulin aspart (NOVOLOG) 100 UNIT/ML injection FOR USE IN PUMP, TOTAL OF  70 UNITS PER DAY 30 mL 8  . Insulin Disposable Pump (V-GO 30) KIT 70 units daily as needed DX E11.9, Z79.4 30 kit 11  . lisinopril (ZESTRIL) 10 MG tablet Take 10 mg by mouth daily.    . metFORMIN (GLUCOPHAGE-XR) 500 MG 24 hr tablet TAKE 1 TABLET BY MOUTH 2 TIMES DAILY. 60 tablet 11  . pantoprazole (PROTONIX) 40 MG tablet 1 PO 30 MINS BEFORE YOUR FIRST AND LAST MEAL. 60 tablet 11  . promethazine (PHENERGAN) 12.5 MG tablet 1-2 po q4-6 h prn nausea or vomiting 30 tablet 1  . traZODone (DESYREL) 100 MG tablet Take 2 tablets (200 mg total) by mouth at bedtime. 180 tablet 3  . YUVAFEM 10 MCG TABS vaginal tablet Place 10 mcg vaginally See admin instructions. Twice a week as needed for vaginal dryness  11  . fluticasone (FLONASE) 50 MCG/ACT nasal spray Place 1 spray into both nostrils daily for 14 days. 16 g 0   No current facility-administered medications on file prior to visit.    Allergies  Allergen Reactions   . Synthroid [Levothyroxine Sodium] Anaphylaxis  . Iodine Rash    I get a skin rash sometimes if iodine applied to my skin.    . Povidone-Iodine Itching and Rash    Family History  Problem Relation Age of Onset  . Asthma Other   . Diabetes Other   . Arthritis Other   . Cancer Mother   . Cancer Brother   . Anesthesia problems Neg Hx   . Hypotension Neg Hx   . Malignant hyperthermia Neg Hx   . Pseudochol deficiency Neg Hx   . Colon polyps Neg Hx   . Colon cancer Neg Hx   . Celiac disease Neg Hx   . Pancreatic cancer Neg Hx   . Stomach cancer Neg Hx   . Ulcerative colitis Neg Hx   . Crohn's disease Neg Hx     BP 130/82 (BP Location: Right Arm, Patient Position: Sitting, Cuff Size: Large)   Pulse 88   Ht _0  (1.702 m)   Wt 234 lb 3.2 oz (106.2 kg)   SpO2 95%   BMI 36.68 kg/m    Review of Systems She denies hypoglycemia/n/v    Objective:   Physical Exam VITAL SIGNS:  See vs page GENERAL: no distress Pulses: dorsalis pedis intact bilat.   MSK: no deformity of the feet CV: no leg edema Skin:  no ulcer on the feet.  normal color and temp on the feet. Neuro: sensation is intact to touch on the feet  Lab Results  Component Value Date   HGBA1C 6.7 (A) 10/20/2020       Assessment & Plan:  Insulin-requiring type 2 DM Hypoglycemia, due to insulin: this limits aggressiveness of glycemic control  Patient Instructions  Please continue the same insulin and other 4 diabetes medications.  check your blood sugar twice a day.  vary the time of day when you check, between before the 3 meals, and at bedtime.  also check if you have symptoms of your blood sugar being too high or too low.  please keep a record of the readings and bring it to your  next appointment here (or you can bring the meter itself).  You can write it on any piece of paper.  please call us sooner if your blood sugar goes below 70, or if you have a lot of readings over 200.  Please come back for a follow-up  appointment in 4 months.

## 2020-10-20 NOTE — Patient Instructions (Addendum)
Please continue the same insulin and other 4 diabetes medications.  check your blood sugar twice a day.  vary the time of day when you check, between before the 3 meals, and at bedtime.  also check if you have symptoms of your blood sugar being too high or too low.  please keep a record of the readings and bring it to your next appointment here (or you can bring the meter itself).  You can write it on any piece of paper.  please call us sooner if your blood sugar goes below 70, or if you have a lot of readings over 200.  Please come back for a follow-up appointment in 4 months.

## 2020-11-19 ENCOUNTER — Other Ambulatory Visit: Payer: Self-pay | Admitting: Endocrinology

## 2020-11-19 DIAGNOSIS — E119 Type 2 diabetes mellitus without complications: Secondary | ICD-10-CM

## 2020-11-21 ENCOUNTER — Telehealth: Payer: Self-pay

## 2020-11-21 ENCOUNTER — Other Ambulatory Visit: Payer: Self-pay | Admitting: Gastroenterology

## 2020-11-21 DIAGNOSIS — R110 Nausea: Secondary | ICD-10-CM

## 2020-11-21 DIAGNOSIS — R1013 Epigastric pain: Secondary | ICD-10-CM

## 2020-11-21 MED ORDER — PANTOPRAZOLE SODIUM 40 MG PO TBEC: DELAYED_RELEASE_TABLET | ORAL | 5 refills | Status: DC

## 2020-11-21 NOTE — Telephone Encounter (Signed)
Noted  

## 2020-11-21 NOTE — Telephone Encounter (Signed)
Refill request received from [facility] for Pantoprazole 40 mg #60, 1 tab 30 mins before breakfast.

## 2020-11-21 NOTE — Telephone Encounter (Signed)
Rx sent 

## 2020-12-10 ENCOUNTER — Other Ambulatory Visit: Payer: Self-pay | Admitting: Endocrinology

## 2020-12-28 ENCOUNTER — Other Ambulatory Visit: Payer: Self-pay

## 2020-12-28 ENCOUNTER — Ambulatory Visit
Admission: EM | Admit: 2020-12-28 | Discharge: 2020-12-28 | Disposition: A | Discharge status: Home / Self Care | Payer: Medicare Other | Attending: Family Medicine | Admitting: Family Medicine

## 2020-12-28 ENCOUNTER — Ambulatory Visit (INDEPENDENT_AMBULATORY_CARE_PROVIDER_SITE_OTHER): Payer: Medicare Other

## 2020-12-28 DIAGNOSIS — S92515A Nondisplaced fracture of proximal phalanx of left lesser toe(s), initial encounter for closed fracture: Secondary | ICD-10-CM

## 2020-12-28 DIAGNOSIS — M79672 Pain in left foot: Secondary | ICD-10-CM

## 2020-12-28 NOTE — ED Provider Notes (Signed)
[facility]    CSN: [id number] Arrival date & time: 12/28/20  1055      History   Chief Complaint Chief Complaint  Patient presents with  . Toe Pain    HPI [name] is a 65 y.o. female.   Reports left foot pain from injuring the foot twice this week. Has not attempted OTC treatment. Has continued to exercise and walk 4 miles this morning and yesterday morning. Reports that the lateral aspect of the left foot is swollen, erythematous, bruised and tender to touch. Reports that her left little toe was hyperextended laterally with a dog. She then reports that she hit the lateral aspect of the left foot on a piece of furniture at home. Denies previous symptoms. Denies numbness, tingling, radiating pain, loss of strength of the extremity, other symptoms.  ROS per HPI  The history is provided by the patient.  Toe Pain    Past Medical History:  Diagnosis Date  . Adenocarcinoma of right lung, stage 1 ([place]) 11/29/2016  . Cancer ([place])   . Chronic pain   . Depression    takes Lexapro daily  . Diabetes mellitus    takes Metformin daily and has an insulin pump.Fasting blood sugar runs250  . GERD (gastroesophageal reflux disease)    takes Pantoprazole daily  . History of colon polyps    benign  . Hyperlipidemia    takes Atorvastatin daily  . Hypertension    takes Amlodipine and Lisinopril daily  . Insomnia    takes Trazodone nightly  . Nocturia   . Thyroid disease   . Urinary frequency   . Urinary urgency     Patient Active Problem List   Diagnosis Date Noted  . Nausea without vomiting 01/06/2020  . Abdominal pain 01/06/2020  . Obesity 07/15/2019  . Diarrhea   . History of colonic polyps 03/09/2019  . Microcytic anemia 03/09/2019  . Dysphagia 03/09/2019  . Adenocarcinoma of right lung, stage 1 ([place]) 11/29/2016  . S/P lobectomy of lung 10/15/2016  . Hyperlipidemia 10/02/2016  . Lung nodule 10/02/2016  . Adrenal adenoma, right 10/02/2016  . Diabetes  ([place]) 06/16/2016  . HTN (hypertension) 05/31/2016  . Change in bowel habits 03/03/2014  . Depression 10/13/2013  . Fractured lateral malleolus 08/12/2012  . Left knee sprain 08/12/2012    Past Surgical History:  Procedure Laterality Date  . ABDOMINAL HYSTERECTOMY    . ABDOMINAL SURGERY    . BIOPSY  05/28/2019   Procedure: BIOPSY;  Surgeon: Danie Binder, MD;  [facility];  Service: Endoscopy;;  random colon/gastric/duodenum  . COLONOSCOPY  2005 MAC   TICs, IH  . COLONOSCOPY N/A 03/08/2014   Procedure: COLONOSCOPY;  Surgeon: Danie Binder, MD;  [facility];  Service: Endoscopy;  Laterality: N/A;  1015  . COLONOSCOPY WITH PROPOFOL N/A 05/28/2019   Procedure: COLONOSCOPY WITH PROPOFOL;  Surgeon: Danie Binder, MD;  [facility];  Service: Endoscopy;  Laterality: N/A;  9:15am  . CYSTOSTOMY  11/22/2011   Procedure: CYSTOSTOMY SUPRAPUBIC;  Surgeon: Marissa Nestle, MD;  [facility];  Service: Urology;  Laterality: N/A;  . ESOPHAGOGASTRODUODENOSCOPY N/A 03/08/2014   Procedure: ESOPHAGOGASTRODUODENOSCOPY (EGD);  Surgeon: Danie Binder, MD;  [facility];  Service: Endoscopy;  Laterality: N/A;  . ESOPHAGOGASTRODUODENOSCOPY    . ESOPHAGOGASTRODUODENOSCOPY (EGD) WITH PROPOFOL N/A 05/28/2019   Procedure: ESOPHAGOGASTRODUODENOSCOPY (EGD) WITH PROPOFOL;  Surgeon: Danie Binder, MD;  [facility];  Service: Endoscopy;  Laterality: N/A;  . GIVENS CAPSULE STUDY N/A 06/15/2019   Procedure: GIVENS CAPSULE STUDY;  Surgeon: Danie Binder, MD;  [facility];  Service: Endoscopy;  Laterality: N/A;  7:30am  . HALLUX VALGUS CORRECTION     bilateral foot surgery for bone repairs-multiple  . LOBECTOMY Right 10/15/2016   Procedure: RIGHT UPPER LOBECTOMY;  Surgeon: [name], MD;  Location: [place];  Service: Thoracic;  Laterality: Right;  . LYMPH NODE DISSECTION Right 10/15/2016   Procedure: LYMPH NODE DISSECTION;   Surgeon: [name], MD;  Location: [place];  Service: Thoracic;  Laterality: Right;  . POLYPECTOMY  05/28/2019   Procedure: POLYPECTOMY;  Surgeon: Danie Binder, MD;  [facility];  Service: Endoscopy;;  . SAVORY DILATION N/A 05/28/2019   Procedure: SAVORY DILATION;  Surgeon: Danie Binder, MD;  [facility];  Service: Endoscopy;  Laterality: N/A;  15/16/17  . VAGINA RECONSTRUCTION SURGERY     tvt 2006- [place]  . VIDEO ASSISTED THORACOSCOPY (VATS)/WEDGE RESECTION Right 10/15/2016   Procedure: RIGHT VIDEO ASSISTED THORACOSCOPY (VATS)/WEDGE RESECTION;  Surgeon: [name], MD;  [facility];  Service: Thoracic;  Laterality: Right;    OB History    Gravida      Para      Term      Preterm      AB      Living  0     SAB      IAB      Ectopic      Multiple      Live Births               Home Medications    Prior to Admission medications   Medication Sig Start Date End Date Taking? Authorizing Provider  ACCU-CHEK AVIVA PLUS test strip USE TO MONITOR GLUCOSE LEVELS TWICE A DAY E11.9 11/21/20   Renato Shin, MD  ACCU-CHEK SOFTCLIX LANCETS lancets Use to monitor glucose levels BID; E11.9 Patient taking differently: 1 each by Other route 2 (two) times daily. E11.9 08/21/18   Renato Shin, MD  acetaminophen (TYLENOL) 500 MG tablet Take 1 tablet (500 mg total) by mouth every 6 (six) hours as needed. Patient taking differently: Take 500 mg by mouth every 6 (six) hours as needed (for pain). 08/07/17   Horton, Barbette Hair, MD  atorvastatin (LIPITOR) 20 MG tablet Take 20 mg by mouth daily. 04/27/17   [provider]  benzonatate (TESSALON) 100 MG capsule Take 1 capsule (100 mg total) by mouth every 8 (eight) hours. 10/11/19   Avegno, Darrelyn Hillock, FNP  Blood Glucose Calibration (GLUCOSE CONTROL) SOLN 1 Bottle by In Vitro route as needed. Use to calibrate Accu-Chek Aviva Plus device prn; please provide control solution  compatible with Accu-Chek Aviva Plus device. Patient taking differently: 1 each by Other route as needed (Use to calibrate glucometer). E11.9 08/21/18   Renato Shin, MD  Blood Glucose Monitoring Suppl (ACCU-CHEK AVIVA PLUS) w/Device KIT 1 each by Does not apply route 2 (two) times daily. Use to monitor glucose levels BID; E11.9 Patient taking differently: 1 each by Does not apply route 2 (two) times daily. E11.9 08/19/18   Renato Shin, MD  bromocriptine (PARLODEL) 2.5 MG tablet TAKE 1 TABLET BY MOUTH EVERY DAY 01/25/20   Renato Shin, MD  cetirizine (ZYRTEC ALLERGY) 10 MG tablet Take 1 tablet (10 mg total) by mouth daily. 10/11/19   Avegno, Darrelyn Hillock, FNP  chlorthalidone (  HYGROTON) 25 MG tablet Take 12.5 mg by mouth daily.  11/26/16   [provider]  escitalopram (LEXAPRO) 10 MG tablet Take 1 tablet (10 mg total) by mouth every morning. 10/05/20   Cloria Spring, MD  FARXIGA 10 MG TABS tablet TAKE 1 TABLET BY MOUTH EVERY DAY 11/21/20   Renato Shin, MD  fluticasone [facility]) 50 MCG/ACT nasal spray Place 1 spray into both nostrils daily for 14 days. 10/11/19 10/25/19  Avegno, Darrelyn Hillock, FNP  insulin aspart (NOVOLOG) 100 UNIT/ML injection FOR USE IN PUMP, TOTAL OF  70 UNITS PER DAY 09/15/20   Renato Shin, MD  Insulin Disposable Pump (V-GO 30) KIT 70 units daily as needed DX E11.9, Z79.4 04/25/20   Renato Shin, MD  lisinopril (ZESTRIL) 10 MG tablet Take 10 mg by mouth daily. 05/30/20   [provider]  metFORMIN (GLUCOPHAGE-XR) 500 MG 24 hr tablet TAKE 1 TABLET BY MOUTH 2 TIMES DAILY. 05/15/20   Renato Shin, MD  pantoprazole (PROTONIX) 40 MG tablet 1 PO 30 MINS BEFORE YOUR FIRST AND LAST MEAL. 11/21/20   Erenest Rasher, PA-C  promethazine (PHENERGAN) 12.5 MG tablet 1-2 po q4-6 h prn nausea or vomiting 11/10/19   Fields, Marga Melnick, MD  traZODone (DESYREL) 100 MG tablet Take 2 tablets (200 mg total) by mouth at bedtime. 10/05/20   Cloria Spring, MD  TRULICITY 1.5 ST/4.1DQ SOPN INJECT  1.5 MG INTO THE SKIN ONCE A WEEK. 12/12/20   Renato Shin, MD  YUVAFEM 10 MCG TABS vaginal tablet Place 10 mcg vaginally See admin instructions. Twice a week as needed for vaginal dryness 09/29/16   [provider]    Family History Family History  Problem Relation Age of Onset  . Asthma Other   . Diabetes Other   . Arthritis Other   . Cancer Mother   . Cancer Brother   . Anesthesia problems Neg Hx   . Hypotension Neg Hx   . Malignant hyperthermia Neg Hx   . Pseudochol deficiency Neg Hx   . Colon polyps Neg Hx   . Colon cancer Neg Hx   . Celiac disease Neg Hx   . Pancreatic cancer Neg Hx   . Stomach cancer Neg Hx   . Ulcerative colitis Neg Hx   . Crohn's disease Neg Hx     Social History Social History   Tobacco Use  . Smoking status: Former Smoker    Packs/day: 1.00    Years: 32.00    Pack years: 32.00    Types: Cigarettes  . Smokeless tobacco: Never Used  . Tobacco comment: quit smoking 20 yrs ago  Vaping Use  . Vaping Use: Never used  Substance Use Topics  . Alcohol use: No    Comment: rarely  . Drug use: No     Allergies   Synthroid [levothyroxine sodium], Iodine, and Povidone-iodine   Review of Systems Review of Systems   Physical Exam Triage Vital Signs ED Triage Vitals  Enc Vitals Group     BP 12/28/20 1104 126/76     Pulse Rate 12/28/20 1104 90     Resp 12/28/20 1104 18     Temp 12/28/20 1104 98.3 F (36.8 C)     Temp src --      SpO2 12/28/20 1104 95 %     Weight --      Height --      Head Circumference --      Peak Flow --  Pain Score 12/28/20 1102 6     Pain Loc --      Pain Edu? --      Excl. in [place]? --    No data found.  Updated Vital Signs BP 126/76   Pulse 90   Temp 98.3 F (36.8 C)   Resp 18   SpO2 95%       Physical Exam Vitals and nursing note reviewed.  Constitutional:      General: She is not in acute distress.    Appearance: She is well-developed. She is not ill-appearing.  HENT:     Head:  Normocephalic and atraumatic.     Nose: Nose normal.     Mouth/Throat:     Mouth: Mucous membranes are moist.     Pharynx: Oropharynx is clear.  Eyes:     Extraocular Movements: Extraocular movements intact.     Conjunctiva/sclera: Conjunctivae normal.     Pupils: Pupils are equal, round, and reactive to light.  Cardiovascular:     Rate and Rhythm: Normal rate and regular rhythm.  Pulmonary:     Effort: Pulmonary effort is normal. No respiratory distress.  Musculoskeletal:        General: Swelling, tenderness and signs of injury present.     Cervical back: Normal range of motion and neck supple.     Comments: Lateral aspect of left foot, left little toe  Skin:    General: Skin is warm and dry.     Capillary Refill: Capillary refill takes less than 2 seconds.     Findings: Bruising and erythema present.     Comments: Lateral aspect of left foot, left little toe   Neurological:     General: No focal deficit present.     Mental Status: She is alert and oriented to person, place, and time.  Psychiatric:        Mood and Affect: Mood normal.        Behavior: Behavior normal.        Thought Content: Thought content normal.      UC Treatments / Results  Labs (all labs ordered are listed, but only abnormal results are displayed) Labs Reviewed - No data to display  EKG   Radiology DG Foot Complete Left  Result Date: 12/28/2020 CLINICAL DATA:  Left ankle pain due to 2 separate injuries over the past week. EXAM: LEFT FOOT - COMPLETE 3+ VIEW COMPARISON:  None. FINDINGS: The patient has an acute, nondisplaced fracture through the base of the proximal phalanx of the fifth toe. The fracture extends from the central aspect of the articular surface through the medial and lateral metaphyses. There is sclerosis and cortical thickening at the neck of the distal fourth metatarsal with a small focus of adjacent heterotopic ossification consistent with remote fracture. Mild midfoot osteoarthritis  and small plantar calcaneal spur are seen. IMPRESSION: Acute intra-articular fracture base of the proximal phalanx of the little toe appears nondisplaced. Abnormal appearance of the neck of the fifth metatarsal most suggestive of remote fracture. Mild appearing midfoot osteoarthritis. Electronically Signed   By: Inge Rise M.D.   On: 12/28/2020 11:29    Procedures Procedures (including critical care time)  Medications Ordered in UC Medications - No data to display  Initial Impression / Assessment and Plan / UC Course  I have reviewed the triage vital signs and the nursing notes.  Pertinent labs & imaging results that were available during my care of the patient were reviewed by me and considered  in my medical decision making (see chart for details).    Non displaced fracture of proximal phalanx of L lesser toes Left foot pain  Post op shoe applied in office  Crutches given in office  May take ibuprofen/tylenol as needed for pain May ice the area as needed Follow up with orthopedics  Final Clinical Impressions(s) / UC Diagnoses   Final diagnoses:  Left foot pain  Nondisplaced fracture of proximal phalanx of left lesser toe(s), initial encounter for closed fracture     Discharge Instructions     We have placed a post op shoe to your left foot, as well as crutches  May use ice to the area  May take ibuprofen and tylenol as needed for pain  Follow up with Dr Aline Brochure    ED Prescriptions    None     PDMP not reviewed this encounter.   Faustino Congress, NP 12/28/20 1146

## 2020-12-28 NOTE — Discharge Instructions (Signed)
We have placed a post op shoe to your left foot, as well as crutches  May use ice to the area  May take ibuprofen and tylenol as needed for pain  Follow up with Dr Aline Brochure

## 2020-12-28 NOTE — ED Triage Notes (Signed)
Pt presents with left foot pain from 2 separate injuries this week

## 2021-01-02 ENCOUNTER — Other Ambulatory Visit: Payer: Self-pay

## 2021-01-02 ENCOUNTER — Encounter: Payer: Self-pay | Admitting: Orthopedic Surgery

## 2021-01-02 ENCOUNTER — Ambulatory Visit (INDEPENDENT_AMBULATORY_CARE_PROVIDER_SITE_OTHER): Payer: Medicare Other | Admitting: Orthopedic Surgery

## 2021-01-02 VITALS — BP 125/75 | HR 92 | Ht 67.0 in | Wt 235.0 lb

## 2021-01-02 DIAGNOSIS — G4700 Insomnia, unspecified: Secondary | ICD-10-CM | POA: Insufficient documentation

## 2021-01-02 DIAGNOSIS — S92515A Nondisplaced fracture of proximal phalanx of left lesser toe(s), initial encounter for closed fracture: Secondary | ICD-10-CM | POA: Diagnosis not present

## 2021-01-02 DIAGNOSIS — E2839 Other primary ovarian failure: Secondary | ICD-10-CM | POA: Insufficient documentation

## 2021-01-02 DIAGNOSIS — E7800 Pure hypercholesterolemia, unspecified: Secondary | ICD-10-CM | POA: Insufficient documentation

## 2021-01-02 DIAGNOSIS — G8929 Other chronic pain: Secondary | ICD-10-CM | POA: Insufficient documentation

## 2021-01-02 DIAGNOSIS — M898X8 Other specified disorders of bone, other site: Secondary | ICD-10-CM | POA: Insufficient documentation

## 2021-01-02 DIAGNOSIS — F331 Major depressive disorder, recurrent, moderate: Secondary | ICD-10-CM | POA: Insufficient documentation

## 2021-01-02 DIAGNOSIS — I493 Ventricular premature depolarization: Secondary | ICD-10-CM | POA: Insufficient documentation

## 2021-01-02 DIAGNOSIS — Z794 Long term (current) use of insulin: Secondary | ICD-10-CM | POA: Insufficient documentation

## 2021-01-02 NOTE — Progress Notes (Signed)
Intraarticular fx prox phal small toe left foot   NEW PROBLEM//OFFICE VISIT  Summary assessment and plan:   Intra-articular fracture left foot proximal phalanx small toe, diabetic, walks 4 miles a day.  Walked 4 miles the day after she did and then started having pain and swelling therefore went to the ER.  Fracture is nondisplaced patient is comfortable in regular shoes can resume normal activities with frequent checks of her foot to look for any blisters  X-ray in 4 weeks  MEDICAL DECISION MAKING  A.  Encounter Diagnosis  Name Primary?  . Closed nondisplaced fracture of proximal phalanx of lesser toe of left foot, initial encounter Yes    B. DATA ANALYSED:   IMAGING: Interpretation of images: External images show a T shaped fracture of the proximal phalanx of the left foot involving the small digit  Orders: None  Outside records reviewed: None   C. MANAGEMENT   Nonoperative  No orders of the defined types were placed in this encounter.   Chief Complaint  Patient presents with  . Foot Injury    Left 12/26/20    65 year old female diabetic injured her left small toe on the 30th.  She says she hurt it when the dog jumped on it and then the same day kicked against something and then noticed some pain however, she walked 4 miles the next day and then had some swelling and thought she better get it checked which she did.  X-ray showed intra-articular fracture T-type small digit left foot proximal phalanx  She has been able to get out of the postop shoe into regular sneakers   BP 125/75   Pulse 92   Ht _0  (1.702 m)   Wt 235 lb (106.6 kg)   BMI 36.81 kg/m    General appearance: Well-developed well-nourished no gross deformities  Cardiovascular normal pulse and perfusion normal color without edema  Neurologically o sensation loss or deficits or pathologic reflexes  Psychological: Awake alert and oriented x3 mood and affect normal  Skin no lacerations or  ulcerations no nodularity no palpable masses, no erythema or nodularity  Musculoskeletal: The left small toe has a small amount of bruising at the proximal aspect slight tenderness has a slight crossover deformity which is chronic   Review of Systems  Constitutional: Positive for fever.  Skin: Negative.   Neurological: Negative for tingling.     Past Medical History:  Diagnosis Date  . Adenocarcinoma of right lung, stage 1 ([place]) 11/29/2016  . Cancer ([place])   . Chronic pain   . Depression    takes Lexapro daily  . Diabetes mellitus    takes Metformin daily and has an insulin pump.Fasting blood sugar runs250  . GERD (gastroesophageal reflux disease)    takes Pantoprazole daily  . History of colon polyps    benign  . Hyperlipidemia    takes Atorvastatin daily  . Hypertension    takes Amlodipine and Lisinopril daily  . Insomnia    takes Trazodone nightly  . Nocturia   . Thyroid disease   . Urinary frequency   . Urinary urgency     Past Surgical History:  Procedure Laterality Date  . ABDOMINAL HYSTERECTOMY    . ABDOMINAL SURGERY    . BIOPSY  05/28/2019   Procedure: BIOPSY;  Surgeon: Danie Binder, MD;  [facility];  Service: Endoscopy;;  random colon/gastric/duodenum  . COLONOSCOPY  2005 MAC   TICs, IH  . COLONOSCOPY N/A 03/08/2014   Procedure:  COLONOSCOPY;  Surgeon: Danie Binder, MD;  [facility];  Service: Endoscopy;  Laterality: N/A;  1015  . COLONOSCOPY WITH PROPOFOL N/A 05/28/2019   Procedure: COLONOSCOPY WITH PROPOFOL;  Surgeon: Danie Binder, MD;  [facility];  Service: Endoscopy;  Laterality: N/A;  9:15am  . CYSTOSTOMY  11/22/2011   Procedure: CYSTOSTOMY SUPRAPUBIC;  Surgeon: Marissa Nestle, MD;  [facility];  Service: Urology;  Laterality: N/A;  . ESOPHAGOGASTRODUODENOSCOPY N/A 03/08/2014   Procedure: ESOPHAGOGASTRODUODENOSCOPY (EGD);  Surgeon: Danie Binder, MD;  [facility];  Service: Endoscopy;   Laterality: N/A;  . ESOPHAGOGASTRODUODENOSCOPY    . ESOPHAGOGASTRODUODENOSCOPY (EGD) WITH PROPOFOL N/A 05/28/2019   Procedure: ESOPHAGOGASTRODUODENOSCOPY (EGD) WITH PROPOFOL;  Surgeon: Danie Binder, MD;  [facility];  Service: Endoscopy;  Laterality: N/A;  . GIVENS CAPSULE STUDY N/A 06/15/2019   Procedure: GIVENS CAPSULE STUDY;  Surgeon: Danie Binder, MD;  [facility];  Service: Endoscopy;  Laterality: N/A;  7:30am  . HALLUX VALGUS CORRECTION     bilateral foot surgery for bone repairs-multiple  . LOBECTOMY Right 10/15/2016   Procedure: RIGHT UPPER LOBECTOMY;  Surgeon: [name], MD;  Location: [place];  Service: Thoracic;  Laterality: Right;  . LYMPH NODE DISSECTION Right 10/15/2016   Procedure: LYMPH NODE DISSECTION;  Surgeon: [name], MD;  Location: [place];  Service: Thoracic;  Laterality: Right;  . POLYPECTOMY  05/28/2019   Procedure: POLYPECTOMY;  Surgeon: Danie Binder, MD;  [facility];  Service: Endoscopy;;  . SAVORY DILATION N/A 05/28/2019   Procedure: SAVORY DILATION;  Surgeon: Danie Binder, MD;  [facility];  Service: Endoscopy;  Laterality: N/A;  15/16/17  . VAGINA RECONSTRUCTION SURGERY     tvt 2006- [place]  . VIDEO ASSISTED THORACOSCOPY (VATS)/WEDGE RESECTION Right 10/15/2016   Procedure: RIGHT VIDEO ASSISTED THORACOSCOPY (VATS)/WEDGE RESECTION;  Surgeon: [name], MD;  Location: [place];  Service: Thoracic;  Laterality: Right;    Family History  Problem Relation Age of Onset  . Asthma Other   . Diabetes Other   . Arthritis Other   . Cancer Mother   . Cancer Brother   . Anesthesia problems Neg Hx   . Hypotension Neg Hx   . Malignant hyperthermia Neg Hx   . Pseudochol deficiency Neg Hx   . Colon polyps Neg Hx   . Colon cancer Neg Hx   . Celiac disease Neg Hx   . Pancreatic cancer Neg Hx   . Stomach cancer Neg Hx   . Ulcerative colitis Neg Hx   . Crohn's disease Neg Hx     Social History   Tobacco Use  . Smoking status: Former Smoker    Packs/day: 1.00    Years: 32.00    Pack years: 32.00    Types: Cigarettes  . Smokeless tobacco: Never Used  . Tobacco comment: quit smoking 20 yrs ago  Vaping Use  . Vaping Use: Never used  Substance Use Topics  . Alcohol use: No    Comment: rarely  . Drug use: No    Allergies  Allergen Reactions  . Synthroid [Levothyroxine Sodium] Anaphylaxis  . Hydrochlorothiazide     Other reaction(s): cramps  . Iodine Rash    I get a skin rash sometimes if iodine applied to my skin.    . Povidone-Iodine Itching and Rash    Current Meds  Medication Sig  . ACCU-CHEK AVIVA PLUS  test strip USE TO MONITOR GLUCOSE LEVELS TWICE A DAY E11.9  . ACCU-CHEK SOFTCLIX LANCETS lancets Use to monitor glucose levels BID; E11.9 (Patient taking differently: 1 each by Other route 2 (two) times daily. E11.9)  . acetaminophen (TYLENOL) 500 MG tablet Take 1 tablet (500 mg total) by mouth every 6 (six) hours as needed. (Patient taking differently: Take 500 mg by mouth every 6 (six) hours as needed (for pain).)  . atorvastatin (LIPITOR) 20 MG tablet Take 20 mg by mouth daily.  . benzonatate (TESSALON) 100 MG capsule Take 1 capsule (100 mg total) by mouth every 8 (eight) hours.  . Blood Glucose Calibration (GLUCOSE CONTROL) SOLN 1 Bottle by In Vitro route as needed. Use to calibrate Accu-Chek Aviva Plus device prn; please provide control solution compatible with Accu-Chek Aviva Plus device. (Patient taking differently: 1 each by Other route as needed (Use to calibrate glucometer). E11.9)  . Blood Glucose Monitoring Suppl (ACCU-CHEK AVIVA PLUS) w/Device KIT 1 each by Does not apply route 2 (two) times daily. Use to monitor glucose levels BID; E11.9 (Patient taking differently: 1 each by Does not apply route 2 (two) times daily. E11.9)  . bromocriptine (PARLODEL) 2.5 MG tablet TAKE 1 TABLET BY MOUTH EVERY DAY  . cetirizine (ZYRTEC ALLERGY) 10 MG  tablet Take 1 tablet (10 mg total) by mouth daily.  . chlorthalidone (HYGROTON) 25 MG tablet Take 12.5 mg by mouth daily.   Marland Kitchen escitalopram (LEXAPRO) 10 MG tablet Take 1 tablet (10 mg total) by mouth every morning.  Marland Kitchen FARXIGA 10 MG TABS tablet TAKE 1 TABLET BY MOUTH EVERY DAY  . insulin aspart (NOVOLOG) 100 UNIT/ML injection FOR USE IN PUMP, TOTAL OF  70 UNITS PER DAY  . Insulin Disposable Pump (V-GO 30) KIT 70 units daily as needed DX E11.9, Z79.4  . lisinopril (ZESTRIL) 10 MG tablet Take 10 mg by mouth daily.  . metFORMIN (GLUCOPHAGE-XR) 500 MG 24 hr tablet TAKE 1 TABLET BY MOUTH 2 TIMES DAILY.  . pantoprazole (PROTONIX) 40 MG tablet 1 PO 30 MINS BEFORE YOUR FIRST AND LAST MEAL.  . promethazine (PHENERGAN) 12.5 MG tablet 1-2 po q4-6 h prn nausea or vomiting  . traZODone (DESYREL) 100 MG tablet Take 2 tablets (200 mg total) by mouth at bedtime.  . TRULICITY 1.5 VZ/8.5YI SOPN INJECT 1.5 MG INTO THE SKIN ONCE A WEEK.  Merril Abbe 10 MCG TABS vaginal tablet Place 10 mcg vaginally See admin instructions. Twice a week as needed for vaginal dryness            Arther Abbott, MD  01/02/2021 10:23 AM

## 2021-02-06 ENCOUNTER — Other Ambulatory Visit: Payer: Self-pay

## 2021-02-06 ENCOUNTER — Encounter: Payer: Self-pay | Admitting: Orthopedic Surgery

## 2021-02-06 ENCOUNTER — Ambulatory Visit (INDEPENDENT_AMBULATORY_CARE_PROVIDER_SITE_OTHER): Payer: Medicare Other | Admitting: Orthopedic Surgery

## 2021-02-06 ENCOUNTER — Ambulatory Visit: Payer: Medicare Other

## 2021-02-06 VITALS — BP 126/70 | HR 87 | Ht 67.0 in | Wt 238.0 lb

## 2021-02-06 DIAGNOSIS — S92515D Nondisplaced fracture of proximal phalanx of left lesser toe(s), subsequent encounter for fracture with routine healing: Secondary | ICD-10-CM

## 2021-02-06 DIAGNOSIS — M533 Sacrococcygeal disorders, not elsewhere classified: Secondary | ICD-10-CM | POA: Diagnosis not present

## 2021-02-06 NOTE — Progress Notes (Signed)
Chief Complaint  Patient presents with   foot/toe    Follow up/xray    Encounter Diagnoses  Name Primary?   Closed nondisplaced fracture of proximal phalanx of lesser toe of left foot with routine healing, subsequent encounter Yes   SI (sacroiliac) joint dysfunction     65 year old female with a fracture of the left fifth digit of the foot proximal phalanx fracture doing well walking 4 miles a day however history of SI joint dysfunction had PT 10 years ago complaining of lower back pain  Recommend PT  X-rays of the toe  images of the left foot fifth digit small toe fracture follow-up  Intra-articular fracture of the proximal phalanx of the small toe  Overall alignment is maintained comminuted fracture noted minimal callus formed no changes in position  Original x-ray from June 1  Impression stable proximal phalanx fracture fifth digit left foot  Plan is for physical therapy for the lower back/SI joint issues if she does not improve we can do images at a later date

## 2021-02-06 NOTE — Patient Instructions (Signed)
Physical therapy scheduled for the lower back pain if no improvement after physical therapy please schedule an appointment to have x-rays done

## 2021-02-06 NOTE — Patient Instructions (Signed)
Physical therapy has been ordered for you at [facility]. They should call you to schedule, [phone] is the phone number to call , if you want to call to schedule, it may be quicker to get in if you call.

## 2021-02-09 ENCOUNTER — Other Ambulatory Visit: Payer: Self-pay | Admitting: Endocrinology

## 2021-02-16 ENCOUNTER — Ambulatory Visit: Payer: Medicare Other | Admitting: Endocrinology

## 2021-02-21 ENCOUNTER — Other Ambulatory Visit: Payer: Self-pay

## 2021-02-21 ENCOUNTER — Ambulatory Visit (HOSPITAL_COMMUNITY): Payer: Medicare Other | Attending: Orthopedic Surgery | Admitting: Physical Therapy

## 2021-02-21 ENCOUNTER — Encounter (HOSPITAL_COMMUNITY): Payer: Self-pay | Admitting: Physical Therapy

## 2021-02-21 DIAGNOSIS — M6281 Muscle weakness (generalized): Secondary | ICD-10-CM | POA: Diagnosis present

## 2021-02-21 DIAGNOSIS — M533 Sacrococcygeal disorders, not elsewhere classified: Secondary | ICD-10-CM | POA: Diagnosis not present

## 2021-02-21 NOTE — Therapy (Signed)
[place], [place], [address] Phone: [phone]   Fax:  [phone]  Physical Therapy Evaluation  Patient Details  Name: [name] MRN: [id number] Date of Birth: 1955-10-15 Referring Provider (PT): Arther Abbott MD   Encounter Date: 02/21/2021   PT End of Session - 02/21/21 1130     Visit Number 1    Number of Visits 12    Date for PT Re-Evaluation 04/04/21    Authorization Type medicare primary and [employer]etna NAP 80 VL    Progress Note Due on Visit 10    PT Start Time 1130    PT Stop Time 1200    PT Time Calculation (min) 30 min             Past Medical History:  Diagnosis Date   Adenocarcinoma of right lung, stage 1 ([place]) 11/29/2016   Cancer ([place])    Chronic pain    Depression    takes Lexapro daily   Diabetes mellitus    takes Metformin daily and has an insulin pump.Fasting blood sugar runs250   GERD (gastroesophageal reflux disease)    takes Pantoprazole daily   History of colon polyps    benign   Hyperlipidemia    takes Atorvastatin daily   Hypertension    takes Amlodipine and Lisinopril daily   Insomnia    takes Trazodone nightly   Nocturia    Thyroid disease    Urinary frequency    Urinary urgency     Past Surgical History:  Procedure Laterality Date   ABDOMINAL HYSTERECTOMY     ABDOMINAL SURGERY     BIOPSY  05/28/2019   Procedure: BIOPSY;  Surgeon: Danie Binder, MD;  [facility];  Service: Endoscopy;;  random colon/gastric/duodenum   COLONOSCOPY  2005 MAC   TICs, IH   COLONOSCOPY N/A 03/08/2014   Procedure: COLONOSCOPY;  Surgeon: Danie Binder, MD;  [facility];  Service: Endoscopy;  Laterality: N/A;  1015   COLONOSCOPY WITH PROPOFOL N/A 05/28/2019   Procedure: COLONOSCOPY WITH PROPOFOL;  Surgeon: Danie Binder, MD;  [facility];  Service: Endoscopy;  Laterality: N/A;  9:15am   CYSTOSTOMY  11/22/2011   Procedure: CYSTOSTOMY  SUPRAPUBIC;  Surgeon: Marissa Nestle, MD;  [facility];  Service: Urology;  Laterality: N/A;   ESOPHAGOGASTRODUODENOSCOPY N/A 03/08/2014   Procedure: ESOPHAGOGASTRODUODENOSCOPY (EGD);  Surgeon: Danie Binder, MD;  [facility];  Service: Endoscopy;  Laterality: N/A;   ESOPHAGOGASTRODUODENOSCOPY     ESOPHAGOGASTRODUODENOSCOPY (EGD) WITH PROPOFOL N/A 05/28/2019   Procedure: ESOPHAGOGASTRODUODENOSCOPY (EGD) WITH PROPOFOL;  Surgeon: Danie Binder, MD;  [facility];  Service: Endoscopy;  Laterality: N/A;   GIVENS CAPSULE STUDY N/A 06/15/2019   Procedure: GIVENS CAPSULE STUDY;  Surgeon: Danie Binder, MD;  [facility];  Service: Endoscopy;  Laterality: N/A;  7:30am   HALLUX VALGUS CORRECTION     bilateral foot surgery for bone repairs-multiple   LOBECTOMY Right 10/15/2016   Procedure: RIGHT UPPER LOBECTOMY;  Surgeon: [name], MD;  Location: [place];  Service: Thoracic;  Laterality: Right;   LYMPH NODE DISSECTION Right 10/15/2016   Procedure: LYMPH NODE DISSECTION;  Surgeon: [name], MD;  Location: [place];  Service: Thoracic;  Laterality: Right;   POLYPECTOMY  05/28/2019   Procedure: POLYPECTOMY;  Surgeon: Danie Binder, MD;  [facility];  Service: Endoscopy;;   SAVORY DILATION N/A  05/28/2019   Procedure: SAVORY DILATION;  Surgeon: Danie Binder, MD;  [facility];  Service: Endoscopy;  Laterality: N/A;  15/16/17   VAGINA RECONSTRUCTION SURGERY     tvt 2006- [place] (VATS)/WEDGE RESECTION Right 10/15/2016   Procedure: RIGHT VIDEO ASSISTED THORACOSCOPY (VATS)/WEDGE RESECTION;  Surgeon: [name], MD;  Location: [place];  Service: Thoracic;  Laterality: Right;    There were no vitals filed for this visit.    Subjective Assessment - 02/21/21 1136     Subjective States that her SIJ was out of line many years ago and she did PT and it felt better. States that  lately if she lays on her back she has pain right across her low back and into her buttocks. States that right side is always the stiffest. States that when she tries to put on her right sock it is more difficult to get on then her left. States that she walks 4 miles daily. States that it gets a little while to get her hips/back warmed up. States she has tried a couple over her exercises from PT before but it hasn't been helping lately. States that sometimes it feels like it catches when she walks/stands. Current pain is not just kind of a dull ache. States a worse it is 3/10 first thing in the morning until she gets moving. States increased symptoms have been going on for the last year    Pertinent History SB, depression, GERD    Limitations Sitting    Diagnostic tests no recent imaging    Patient Stated Goals to learn exercises to help manage her symptoms to avoid surgery    Currently in Pain? Yes    Pain Score 3     Pain Location Buttocks    Pain Orientation Right    Pain Descriptors / Indicators Aching    Pain Type Chronic pain    Pain Radiating Towards into hips - occasional achiness into both legs    Pain Frequency Intermittent    Aggravating Factors  AM, first morning movement, standing in one place, sleeping on her back    Pain Relieving Factors walking                [facility] PT Assessment - 02/21/21 0001       Assessment   Medical Diagnosis LX/SIJ pain    Referring Provider (PT) Arther Abbott MD    Prior Therapy yes for this issue at least 15 years ago      Balance Screen   Has the patient fallen in the past 6 months No      Observation/Other Assessments   Observations right hip/ilium higher then left, left lateral shift noted in standing. Stands with right ilium higher, sits with ilium equal but sits with thoracic right rotation - left thoracic rotation stretches right SIJ.    Focus on Therapeutic Outcomes (FOTO)  69% function.      ROM / Strength   AROM / PROM /  Strength AROM;Strength      AROM   AROM Assessment Site Lumbar;Hip    Right/Left Hip Right;Left    Lumbar Flexion 50% limited   no pain   Lumbar Extension 100% limited   pain at center of back   Lumbar - Right Side Bend 50% limited   pinching pain on the right side   Lumbar - Left Side Bend 50% limited   pain on the right side/back burning  Strength   Strength Assessment Site Hip;Knee;Ankle    Right/Left Hip Right;Left    Right Hip Flexion 4+/5   painin back   Left Hip Flexion 5/5    Right/Left Knee Left;Right    Right Knee Flexion 4+/5    Right Knee Extension 4+/5    Left Knee Flexion 5/5    Left Knee Extension 5/5      Palpation   Palpation comment tenderness to palpation to right lumbar/glute musculature      Special Tests    Special Tests Leg LengthTest    Leg length test  Apparent      Apparent   Comments equal to right normal--                        Objective measurements completed on examination: See above findings.       [place] Adult PT Treatment/Exercise - 02/21/21 0001       Exercises   Exercises Lumbar      Lumbar Exercises: Seated   Other Seated Lumbar Exercises MET to SIJ; then hip add/abd isometrics x10                    PT Education - 02/21/21 1204     Education Details on current condition, onHEP, on POC, on current presentation.    Person(s) Educated Patient    Methods Explanation    Comprehension Verbalized understanding              PT Short Term Goals - 02/21/21 1207       PT SHORT TERM GOAL #1   Title Patient will be able to demonstrate painfree lumbar ROM    Time 3    Period Weeks    Status New    Target Date 03/14/21      PT SHORT TERM GOAL #2   Title Patient will report at least 50% improvement in overall symptoms and/or function to demonstrate improved functional mobility    Time 3    Period Weeks    Status New    Target Date 03/14/21      PT SHORT TERM GOAL #3   Title Patient will be  independent in self management strategies to improve quality of life and functional outcomes.    Time 3    Period Weeks    Status New    Target Date 03/14/21               PT Long Term Goals - 02/21/21 1208       PT LONG TERM GOAL #1   Title Patient will be able to sleep without waking up in pain to improve QOL    Time 6    Period Weeks    Status New    Target Date 04/04/21      PT LONG TERM GOAL #2   Title Patient will report at least 75% improvement in overall symptoms and/or function to demonstrate improved functional mobility    Time 6    Period Weeks    Status New    Target Date 04/04/21      PT LONG TERM GOAL #3   Title Patient will improve on FOTO score to meet predicted outcomes to demonstrate improved functional mobility.    Time 6    Period Weeks    Status New    Target Date 04/04/21  Plan - 02/21/21 1205     Clinical Impression Statement Patient is a 65 y.o. female who presents to physical therapy with complaint of sacroiliac pain and history of SIJ pain. She has tried some of her previous exercises that she learned in PT and they are note working so she came back to therapy to avoid injections and surgery. Patient demonstrates decreased strength and ROM restriction which are likely contributing to symptoms of pain and are negatively impacting patient ability to perform ADLs and functional mobility tasks. Patient will benefit from skilled physical therapy services to address these deficits to reduce pain, improve level of function with ADLs, functional mobility tasks, and reduce risk for falls.    Personal Factors and Comorbidities Comorbidity 1    Comorbidities DB    Examination-Activity Limitations Dressing;Transfers;Carry;Bend;Squat;Sleep    Examination-Participation Restrictions Meal Prep;Driving;Shop;Community Activity;Cleaning    Stability/Clinical Decision Making Stable/Uncomplicated    Clinical Decision Making Low    Rehab  Potential Good    PT Frequency 2x / week    PT Duration 6 weeks    PT Treatment/Interventions ADLs/Self Care Home Management;Cryotherapy;Moist Heat;Electrical Stimulation;Traction;Therapeutic exercise;Therapeutic activities;Manual techniques;Neuromuscular re-education;Patient/family education;Dry needling;Joint Manipulations;Spinal Manipulations;Passive range of motion    PT Next Visit Plan assess hip abd/ext/add/IR/ER strength and ROM , address left lateral shift, spinal ROM/stretch, core and hip strengthening, SIJ manuvers as indicated    PT Home Exercise Plan hipabd/add isometric, MET for SIJ    Consulted and Agree with Plan of Care Patient             Patient will benefit from skilled therapeutic intervention in order to improve the following deficits and impairments:  Decreased strength, Decreased activity tolerance, Pain, Decreased range of motion  Visit Diagnosis: Sacroiliac joint pain  Muscle weakness (generalized)     Problem List Patient Active Problem List   Diagnosis Date Noted   Chronic pain 01/02/2021   Decreased estrogen level 01/02/2021   Exostosis of skull 01/02/2021   Insomnia 01/02/2021   Long term (current) use of insulin ([place]) 01/02/2021   Moderate recurrent major depression ([place]) 01/02/2021   Pure hypercholesterolemia 01/02/2021   Ventricular premature beats 01/02/2021   Nausea without vomiting 01/06/2020   Abdominal pain 01/06/2020   Obesity 07/15/2019   Diarrhea    History of colonic polyps 03/09/2019   Microcytic anemia 03/09/2019   Dysphagia 03/09/2019   Adenocarcinoma of right lung, stage 1 ([place]) 11/29/2016   S/P lobectomy of lung 10/15/2016   Hyperlipidemia 10/02/2016   Lung nodule 10/02/2016   Adrenal adenoma, right 10/02/2016   Diabetes ([place]) 06/16/2016   HTN (hypertension) 05/31/2016   Increased frequency of urination 07/16/2014   Change in bowel habits 03/03/2014   Depression 10/13/2013   Fractured lateral malleolus 08/12/2012    Left knee sprain 08/12/2012   12:12 PM, 02/21/21 Jerene Pitch, DPT Physical Therapy with [facility]  [phone] office   [place] [address] [place], [place], [address] Phone: [phone]   Fax:  [phone]  Name: [name] MRN: [id number] Date of Birth: 01-03-1956

## 2021-02-23 ENCOUNTER — Other Ambulatory Visit: Payer: Self-pay

## 2021-02-23 ENCOUNTER — Ambulatory Visit (INDEPENDENT_AMBULATORY_CARE_PROVIDER_SITE_OTHER): Payer: Medicare Other | Admitting: Endocrinology

## 2021-02-23 VITALS — BP 124/70 | HR 77 | Ht 67.0 in | Wt 238.6 lb

## 2021-02-23 DIAGNOSIS — E119 Type 2 diabetes mellitus without complications: Secondary | ICD-10-CM | POA: Diagnosis not present

## 2021-02-23 DIAGNOSIS — Z794 Long term (current) use of insulin: Secondary | ICD-10-CM | POA: Diagnosis not present

## 2021-02-23 LAB — POCT GLYCOSYLATED HEMOGLOBIN (HGB A1C): Hemoglobin A1C: 6.9 % — AB (ref 4.0–5.6)

## 2021-02-23 NOTE — Patient Instructions (Addendum)
Please continue the same insulin and other 4 diabetes medications.   check your blood sugar twice a day.  vary the time of day when you check, between before the 3 meals, and at bedtime.  also check if you have symptoms of your blood sugar being too high or too low.  please keep a record of the readings and bring it to your next appointment here (or you can bring the meter itself).  You can write it on any piece of paper.  please call us sooner if your blood sugar goes below 70, or if you have a lot of readings over 200.   Please come back for a follow-up appointment in 4 months.

## 2021-02-23 NOTE — Progress Notes (Signed)
Subjective:    Patient ID: [name], female    DOB: 1956/06/11, 65 y.o.   MRN: [id number]  HPI Pt returns for f/u of diabetes mellitus: DM type: Insulin-requiring type 2.   Dx'ed: 6384 Complications: none Therapy: insulin since soon after dx (now V-GO-30), 3 oral meds, and Trulicity.   GDM: never (G0) DKA: never Severe hypoglycemia: never.   Pancreatitis: never.  Other: she stopped victoza, due to lack of effect; med dosages (esp Trulicity) are limited by nausea.   Interval history: She takes approx 53-64 clicks per day, via the V-GO-30.  Meter is downloaded today, and the printout is scanned into the record. cbg varies from 90-190.  There is no trend throughout the day. She seldom has hypoglycemia, and these episodes are mild.  This usually happens with exertion.   Past Medical History:  Diagnosis Date   Adenocarcinoma of right lung, stage 1 ([place]) 11/29/2016   Cancer (HCC)    Chronic pain    Depression    takes Lexapro daily   Diabetes mellitus    takes Metformin daily and has an insulin pump.Fasting blood sugar runs250   GERD (gastroesophageal reflux disease)    takes Pantoprazole daily   History of colon polyps    benign   Hyperlipidemia    takes Atorvastatin daily   Hypertension    takes Amlodipine and Lisinopril daily   Insomnia    takes Trazodone nightly   Nocturia    Thyroid disease    Urinary frequency    Urinary urgency     Past Surgical History:  Procedure Laterality Date   ABDOMINAL HYSTERECTOMY     ABDOMINAL SURGERY     BIOPSY  05/28/2019   Procedure: BIOPSY;  Surgeon: Danie Binder, MD;  [facility];  Service: Endoscopy;;  random colon/gastric/duodenum   COLONOSCOPY  2005 MAC   TICs, IH   COLONOSCOPY N/A 03/08/2014   Procedure: COLONOSCOPY;  Surgeon: Danie Binder, MD;  [facility];  Service: Endoscopy;  Laterality: N/A;  1015   COLONOSCOPY WITH PROPOFOL N/A 05/28/2019   Procedure: COLONOSCOPY WITH PROPOFOL;  Surgeon:  Danie Binder, MD;  [facility];  Service: Endoscopy;  Laterality: N/A;  9:15am   CYSTOSTOMY  11/22/2011   Procedure: CYSTOSTOMY SUPRAPUBIC;  Surgeon: Marissa Nestle, MD;  [facility];  Service: Urology;  Laterality: N/A;   ESOPHAGOGASTRODUODENOSCOPY N/A 03/08/2014   Procedure: ESOPHAGOGASTRODUODENOSCOPY (EGD);  Surgeon: Danie Binder, MD;  [facility];  Service: Endoscopy;  Laterality: N/A;   ESOPHAGOGASTRODUODENOSCOPY     ESOPHAGOGASTRODUODENOSCOPY (EGD) WITH PROPOFOL N/A 05/28/2019   Procedure: ESOPHAGOGASTRODUODENOSCOPY (EGD) WITH PROPOFOL;  Surgeon: Danie Binder, MD;  [facility];  Service: Endoscopy;  Laterality: N/A;   GIVENS CAPSULE STUDY N/A 06/15/2019   Procedure: GIVENS CAPSULE STUDY;  Surgeon: Danie Binder, MD;  [facility];  Service: Endoscopy;  Laterality: N/A;  7:30am   HALLUX VALGUS CORRECTION     bilateral foot surgery for bone repairs-multiple   LOBECTOMY Right 10/15/2016   Procedure: RIGHT UPPER LOBECTOMY;  Surgeon: [name], MD;  Location: [facility];  Service: Thoracic;  Laterality: Right;   LYMPH NODE DISSECTION Right 10/15/2016   Procedure: LYMPH NODE DISSECTION;  Surgeon: [name], MD;  Location: [facility];  Service: Thoracic;  Laterality: Right;   POLYPECTOMY  05/28/2019   Procedure: POLYPECTOMY;  Surgeon: Danie Binder, MD;  [facility];  Service:  Endoscopy;;   SAVORY DILATION N/A 05/28/2019   Procedure: SAVORY DILATION;  Surgeon: Danie Binder, MD;  [facility];  Service: Endoscopy;  Laterality: N/A;  15/16/17   VAGINA RECONSTRUCTION SURGERY     tvt 2006- [place] (VATS)/WEDGE RESECTION Right 10/15/2016   Procedure: RIGHT VIDEO ASSISTED THORACOSCOPY (VATS)/WEDGE RESECTION;  Surgeon: [name], MD;  [facility];  Service: Thoracic;  Laterality: Right;    Social History   Socioeconomic History   Marital status:  Single    Spouse name: Not on file   Number of children: Not on file   Years of education: Not on file   Highest education level: Not on file  Occupational History   Not on file  Tobacco Use   Smoking status: Former    Packs/day: 1.00    Years: 32.00    Pack years: 32.00    Types: Cigarettes   Smokeless tobacco: Never   Tobacco comments:    quit smoking 20 yrs ago  Vaping Use   Vaping Use: Never used  Substance and Sexual Activity   Alcohol use: No    Comment: rarely   Drug use: No   Sexual activity: Not on file  Other Topics Concern   Not on file  Social History Narrative   Not on file   Social Determinants of Health   Financial Resource Strain: Not on file  Food Insecurity: Not on file  Transportation Needs: Not on file  Physical Activity: Not on file  Stress: Not on file  Social Connections: Not on file  Intimate Partner Violence: Not on file    Current Outpatient Medications on File Prior to Visit  Medication Sig Dispense Refill   ACCU-CHEK AVIVA PLUS test strip USE TO MONITOR GLUCOSE LEVELS TWICE A DAY E11.9 100 strip 12   ACCU-CHEK SOFTCLIX LANCETS lancets Use to monitor glucose levels BID; E11.9 (Patient taking differently: 1 each by Other route 2 (two) times daily. E11.9) 100 each 12   acetaminophen (TYLENOL) 500 MG tablet Take 1 tablet (500 mg total) by mouth every 6 (six) hours as needed. (Patient taking differently: Take 500 mg by mouth every 6 (six) hours as needed (for pain).) 30 tablet 0   atorvastatin (LIPITOR) 20 MG tablet Take 20 mg by mouth daily.  11   benzonatate (TESSALON) 100 MG capsule Take 1 capsule (100 mg total) by mouth every 8 (eight) hours. 30 capsule 0   Blood Glucose Calibration (GLUCOSE CONTROL) SOLN 1 Bottle by In Vitro route as needed. Use to calibrate Accu-Chek Aviva Plus device prn; please provide control solution compatible with Accu-Chek Aviva Plus device. (Patient taking differently: 1 each by Other route as needed (Use to calibrate  glucometer). E11.9) 1 each 1   Blood Glucose Monitoring Suppl (ACCU-CHEK AVIVA PLUS) w/Device KIT 1 each by Does not apply route 2 (two) times daily. Use to monitor glucose levels BID; E11.9 (Patient taking differently: 1 each by Does not apply route 2 (two) times daily. E11.9) 1 kit 0   bromocriptine (PARLODEL) 2.5 MG tablet TAKE 1 TABLET BY MOUTH EVERY DAY 30 tablet 11   cetirizine (ZYRTEC ALLERGY) 10 MG tablet Take 1 tablet (10 mg total) by mouth daily. 30 tablet 0   chlorthalidone (HYGROTON) 25 MG tablet Take 12.5 mg by mouth daily.      escitalopram (LEXAPRO) 10 MG tablet Take 1 tablet (10 mg total) by mouth every morning. 90 tablet 3   FARXIGA  10 MG TABS tablet TAKE 1 TABLET BY MOUTH EVERY DAY 30 tablet 11   insulin aspart (NOVOLOG) 100 UNIT/ML injection FOR USE IN PUMP, TOTAL OF  70 UNITS PER DAY 30 mL 8   Insulin Disposable Pump (V-GO 30) KIT 70 units daily as needed DX E11.9, Z79.4 30 kit 11   lisinopril (ZESTRIL) 10 MG tablet Take 10 mg by mouth daily.     metFORMIN (GLUCOPHAGE-XR) 500 MG 24 hr tablet TAKE 1 TABLET BY MOUTH 2 TIMES DAILY. 60 tablet 11   pantoprazole (PROTONIX) 40 MG tablet 1 PO 30 MINS BEFORE YOUR FIRST AND LAST MEAL. 60 tablet 5   promethazine (PHENERGAN) 12.5 MG tablet 1-2 po q4-6 h prn nausea or vomiting 30 tablet 1   traZODone (DESYREL) 100 MG tablet Take 2 tablets (200 mg total) by mouth at bedtime. 540 tablet 3   TRULICITY 1.5 JW/1.1BJ SOPN INJECT 1.5 MG INTO THE SKIN ONCE A WEEK. 4 mL 2   YUVAFEM 10 MCG TABS vaginal tablet Place 10 mcg vaginally See admin instructions. Twice a week as needed for vaginal dryness  11   fluticasone (FLONASE) 50 MCG/ACT nasal spray Place 1 spray into both nostrils daily for 14 days. 16 g 0   No current facility-administered medications on file prior to visit.    Allergies  Allergen Reactions   Synthroid [Levothyroxine Sodium] Anaphylaxis   Hydrochlorothiazide     Other reaction(s): cramps   Iodine Rash    I get a skin rash  sometimes if iodine applied to my skin.     Povidone-Iodine Itching and Rash    Family History  Problem Relation Age of Onset   Asthma Other    Diabetes Other    Arthritis Other    Cancer Mother    Cancer Brother    Anesthesia problems Neg Hx    Hypotension Neg Hx    Malignant hyperthermia Neg Hx    Pseudochol deficiency Neg Hx    Colon polyps Neg Hx    Colon cancer Neg Hx    Celiac disease Neg Hx    Pancreatic cancer Neg Hx    Stomach cancer Neg Hx    Ulcerative colitis Neg Hx    Crohn's disease Neg Hx     BP 124/70 (BP Location: Right Arm, Patient Position: Sitting, Cuff Size: Large)   Pulse 77   Ht _0  (1.702 m)   Wt 238 lb 9.6 oz (108.2 kg)   SpO2 94%   BMI 37.37 kg/m    Review of Systems Nausea is mild.     Objective:   Physical Exam Pulses: dorsalis pedis intact bilat.   MSK: no deformity of the feet CV: trace bilat leg edema Skin:  no ulcer on the feet.  normal color and temp on the feet. Neuro: sensation is intact to touch on the feet.    Lab Results  Component Value Date   HGBA1C 6.9 (A) 02/23/2021      Assessment & Plan:  Insulin-requiring type 2 DM Hypoglycemia, due to insulin: this limits aggressiveness of glycemic control  Patient Instructions  Please continue the same insulin and other 4 diabetes medications.   check your blood sugar twice a day.  vary the time of day when you check, between before the 3 meals, and at bedtime.  also check if you have symptoms of your blood sugar being too high or too low.  please keep a record of the readings and bring it to your next appointment  here (or you can bring the meter itself).  You can write it on any piece of paper.  please call us sooner if your blood sugar goes below 70, or if you have a lot of readings over 200.   Please come back for a follow-up appointment in 4 months.

## 2021-03-03 ENCOUNTER — Encounter (HOSPITAL_COMMUNITY): Payer: Self-pay

## 2021-03-03 ENCOUNTER — Ambulatory Visit (HOSPITAL_COMMUNITY): Payer: Medicare Other | Attending: Orthopedic Surgery

## 2021-03-03 ENCOUNTER — Other Ambulatory Visit: Payer: Self-pay

## 2021-03-03 DIAGNOSIS — M533 Sacrococcygeal disorders, not elsewhere classified: Secondary | ICD-10-CM

## 2021-03-03 DIAGNOSIS — M6281 Muscle weakness (generalized): Secondary | ICD-10-CM | POA: Diagnosis not present

## 2021-03-03 NOTE — Therapy (Signed)
[facility], [place], [address] Phone: [phone]   Fax:  [phone]  Physical Therapy Treatment  Patient Details  Name: [name] MRN: [id number] Date of Birth: 1955/12/14 Referring Provider (PT): Arther Abbott MD   Encounter Date: 03/03/2021   PT End of Session - 03/03/21 0941     Visit Number 2    Number of Visits 12    Date for PT Re-Evaluation 04/04/21    Authorization Type medicare primary and [employer] NAP 80 VL    Progress Note Due on Visit 10    PT Start Time 0900    PT Stop Time 0945    PT Time Calculation (min) 45 min    Activity Tolerance Patient tolerated treatment well    Behavior During Therapy [facility] for tasks assessed/performed             Past Medical History:  Diagnosis Date   Adenocarcinoma of right lung, stage 1 ([place]) 11/29/2016   Cancer ([place])    Chronic pain    Depression    takes Lexapro daily   Diabetes mellitus    takes Metformin daily and has an insulin pump.Fasting blood sugar runs250   GERD (gastroesophageal reflux disease)    takes Pantoprazole daily   History of colon polyps    benign   Hyperlipidemia    takes Atorvastatin daily   Hypertension    takes Amlodipine and Lisinopril daily   Insomnia    takes Trazodone nightly   Nocturia    Thyroid disease    Urinary frequency    Urinary urgency     Past Surgical History:  Procedure Laterality Date   ABDOMINAL HYSTERECTOMY     ABDOMINAL SURGERY     BIOPSY  05/28/2019   Procedure: BIOPSY;  Surgeon: Danie Binder, MD;  [facility];  Service: Endoscopy;;  random colon/gastric/duodenum   COLONOSCOPY  2005 MAC   TICs, IH   COLONOSCOPY N/A 03/08/2014   Procedure: COLONOSCOPY;  Surgeon: Danie Binder, MD;  [facility];  Service: Endoscopy;  Laterality: N/A;  1015   COLONOSCOPY WITH PROPOFOL N/A 05/28/2019   Procedure: COLONOSCOPY WITH PROPOFOL;  Surgeon: Danie Binder, MD;  [facility];  Service: Endoscopy;  Laterality: N/A;  9:15am   CYSTOSTOMY  11/22/2011   Procedure: CYSTOSTOMY SUPRAPUBIC;  Surgeon: Marissa Nestle, MD;  [facility];  Service: Urology;  Laterality: N/A;   ESOPHAGOGASTRODUODENOSCOPY N/A 03/08/2014   Procedure: ESOPHAGOGASTRODUODENOSCOPY (EGD);  Surgeon: Danie Binder, MD;  [facility];  Service: Endoscopy;  Laterality: N/A;   ESOPHAGOGASTRODUODENOSCOPY     ESOPHAGOGASTRODUODENOSCOPY (EGD) WITH PROPOFOL N/A 05/28/2019   Procedure: ESOPHAGOGASTRODUODENOSCOPY (EGD) WITH PROPOFOL;  Surgeon: Danie Binder, MD;  [facility];  Service: Endoscopy;  Laterality: N/A;   GIVENS CAPSULE STUDY N/A 06/15/2019   Procedure: GIVENS CAPSULE STUDY;  Surgeon: Danie Binder, MD;  [facility];  Service: Endoscopy;  Laterality: N/A;  7:30am   HALLUX VALGUS CORRECTION     bilateral foot surgery for bone repairs-multiple   LOBECTOMY Right 10/15/2016   Procedure: RIGHT UPPER LOBECTOMY;  Surgeon: [name], MD;  Location: [facility];  Service: Thoracic;  Laterality: Right;   LYMPH NODE DISSECTION Right 10/15/2016   Procedure: LYMPH NODE DISSECTION;  Surgeon: [name], MD;  Location: [facility];  Service: Thoracic;  Laterality: Right;   POLYPECTOMY  05/28/2019   Procedure: POLYPECTOMY;  Surgeon: Danie Binder, MD;  [facility];  Service: Endoscopy;;   SAVORY DILATION N/A 05/28/2019   Procedure: SAVORY DILATION;  Surgeon: Danie Binder, MD;  [facility];  Service: Endoscopy;  Laterality: N/A;  15/16/17   VAGINA RECONSTRUCTION SURGERY     tvt 2006- [place] (VATS)/WEDGE RESECTION Right 10/15/2016   Procedure: RIGHT VIDEO ASSISTED THORACOSCOPY (VATS)/WEDGE RESECTION;  Surgeon: [name], MD;  Location: [facility];  Service: Thoracic;  Laterality: Right;    There were no vitals filed for this visit.   Subjective Assessment - 03/03/21 0921      Subjective Pt notes increased pain/soreness in her lateral hips after MET from 1st session. Still feels like her SI is "locked up" in the AM and has to warm-up to shift it in place    Pertinent History SB, depression, GERD    Limitations Sitting    Diagnostic tests no recent imaging    Patient Stated Goals to learn exercises to help manage her symptoms to avoid surgery                               [facility] Adult PT Treatment/Exercise - 03/03/21 0001       Lumbar Exercises: Standing   Functional Squats 20 reps    Other Standing Lumbar Exercises sidestepping with blue loop 3x10 left/right      Lumbar Exercises: Supine   Isometric Hip Flexion 20 reps;2 seconds    Other Supine Lumbar Exercises alternating hip abd/add isometric 3x10                    PT Education - 03/03/21 0940     Education Details education on HEP additions, walking with trekking poles to assess if some support/trunk flexion minimizes symptoms with prolonged walking. Discussion of benefits on compound lifts (goblet squats)    Person(s) Educated Patient    Methods Explanation    Comprehension Verbalized understanding              PT Short Term Goals - 02/21/21 1207       PT SHORT TERM GOAL #1   Title Patient will be able to demonstrate painfree lumbar ROM    Time 3    Period Weeks    Status New    Target Date 03/14/21      PT SHORT TERM GOAL #2   Title Patient will report at least 50% improvement in overall symptoms and/or function to demonstrate improved functional mobility    Time 3    Period Weeks    Status New    Target Date 03/14/21      PT SHORT TERM GOAL #3   Title Patient will be independent in self management strategies to improve quality of life and functional outcomes.    Time 3    Period Weeks    Status New    Target Date 03/14/21               PT Long Term Goals - 02/21/21 1208       PT LONG TERM GOAL #1   Title Patient will be able to sleep  without waking up in pain to improve QOL    Time 6    Period Weeks    Status New    Target Date 04/04/21      PT LONG TERM GOAL #2  Title Patient will report at least 75% improvement in overall symptoms and/or function to demonstrate improved functional mobility    Time 6    Period Weeks    Status New    Target Date 04/04/21      PT LONG TERM GOAL #3   Title Patient will improve on FOTO score to meet predicted outcomes to demonstrate improved functional mobility.    Time 6    Period Weeks    Status New    Target Date 04/04/21                   Plan - 03/03/21 0941     Clinical Impression Statement Pt endorses positions of trunk flexion improve symptoms and walking tolerance (e.g. shopping cart sign). Pt advised to perform trial of walking with trekking poles to assess pain/tolerance in low back. Tolerating session well with increased activities biasing large, compound movements and adding resistance. Continued sessions to improve lumbopelvic strength/stability. No pain with SI compression/distraction    Personal Factors and Comorbidities Comorbidity 1    Comorbidities DB    Examination-Activity Limitations Dressing;Transfers;Carry;Bend;Squat;Sleep    Examination-Participation Restrictions Meal Prep;Driving;Shop;Community Activity;Cleaning    Stability/Clinical Decision Making Stable/Uncomplicated    Rehab Potential Good    PT Frequency 2x / week    PT Duration 6 weeks    PT Treatment/Interventions ADLs/Self Care Home Management;Cryotherapy;Moist Heat;Electrical Stimulation;Traction;Therapeutic exercise;Therapeutic activities;Manual techniques;Neuromuscular re-education;Patient/family education;Dry needling;Joint Manipulations;Spinal Manipulations;Passive range of motion    PT Next Visit Plan assess hip abd/ext/add/IR/ER strength and ROM , address left lateral shift, spinal ROM/stretch, core and hip strengthening, SIJ manuvers as indicated    PT Home Exercise Plan  hipabd/add isometric, MET for SIJ    Consulted and Agree with Plan of Care Patient             Patient will benefit from skilled therapeutic intervention in order to improve the following deficits and impairments:  Decreased strength, Decreased activity tolerance, Pain, Decreased range of motion  Visit Diagnosis: Muscle weakness (generalized)  Sacroiliac joint pain     Problem List Patient Active Problem List   Diagnosis Date Noted   Chronic pain 01/02/2021   Decreased estrogen level 01/02/2021   Exostosis of skull 01/02/2021   Insomnia 01/02/2021   Long term (current) use of insulin ([place]) 01/02/2021   Moderate recurrent major depression ([place]) 01/02/2021   Pure hypercholesterolemia 01/02/2021   Ventricular premature beats 01/02/2021   Nausea without vomiting 01/06/2020   Abdominal pain 01/06/2020   Obesity 07/15/2019   Diarrhea    History of colonic polyps 03/09/2019   Microcytic anemia 03/09/2019   Dysphagia 03/09/2019   Adenocarcinoma of right lung, stage 1 ([place]) 11/29/2016   S/P lobectomy of lung 10/15/2016   Hyperlipidemia 10/02/2016   Lung nodule 10/02/2016   Adrenal adenoma, right 10/02/2016   Diabetes ([place]) 06/16/2016   HTN (hypertension) 05/31/2016   Increased frequency of urination 07/16/2014   Change in bowel habits 03/03/2014   Depression 10/13/2013   Fractured lateral malleolus 08/12/2012   Left knee sprain 08/12/2012    Toniann Fail 03/03/2021, 9:43 AM  [facility] [address] [place], [place], [address] Phone: [phone]   Fax:  [phone]  Name: [name] MRN: [id number] Date of Birth: 1956-07-27

## 2021-03-09 ENCOUNTER — Other Ambulatory Visit: Payer: Self-pay

## 2021-03-09 ENCOUNTER — Ambulatory Visit (HOSPITAL_COMMUNITY): Payer: Medicare Other

## 2021-03-09 DIAGNOSIS — M6281 Muscle weakness (generalized): Secondary | ICD-10-CM | POA: Diagnosis not present

## 2021-03-09 DIAGNOSIS — M533 Sacrococcygeal disorders, not elsewhere classified: Secondary | ICD-10-CM

## 2021-03-09 NOTE — Patient Instructions (Signed)
Access Code: [email or web address] URL: [email or web address] Date: 03/09/2021 Prepared by: Sherlyn Lees  Exercises Supine Bridge with [employer] Between Knees - 1 x daily - 7 x weekly - 1-2 sets - 10 reps Bridge on Heels - 1 x daily - 7 x weekly - 1-2 sets - 10 reps Isometric Dead Bug with Swiss Ball - 1 x daily - 7 x weekly - 3 sets - 10 reps Push-Up on Counter - 1 x daily - 7 x weekly - 3 sets - 10 reps Full Plank with Hip Flexion/Adduction Knee Drive on Counter - 1 x daily - 7 x weekly - 3 sets - 10 reps Standing Anti-Rotation Press with Anchored Resistance - 1 x daily - 7 x weekly - 3 sets - 10 reps

## 2021-03-09 NOTE — Therapy (Signed)
[place], [place], [address] Phone: [phone]   Fax:  [phone]  Physical Therapy Treatment  Patient Details  Name: [name] MRN: [id number] Date of Birth: 09/23/1955 Referring Provider (PT): Arther Abbott MD   Encounter Date: 03/09/2021   PT End of Session - 03/09/21 1601     Visit Number 3    Number of Visits 12    Date for PT Re-Evaluation 04/04/21    Authorization Type medicare primary and aetna NAP 80 VL    Progress Note Due on Visit 10    PT Start Time 1600    PT Stop Time 1645    PT Time Calculation (min) 45 min    Activity Tolerance Patient tolerated treatment well    Behavior During Therapy [facility] for tasks assessed/performed             Past Medical History:  Diagnosis Date   Adenocarcinoma of right lung, stage 1 ([place]) 11/29/2016   Cancer ([place])    Chronic pain    Depression    takes Lexapro daily   Diabetes mellitus    takes Metformin daily and has an insulin pump.Fasting blood sugar runs250   GERD (gastroesophageal reflux disease)    takes Pantoprazole daily   History of colon polyps    benign   Hyperlipidemia    takes Atorvastatin daily   Hypertension    takes Amlodipine and Lisinopril daily   Insomnia    takes Trazodone nightly   Nocturia    Thyroid disease    Urinary frequency    Urinary urgency     Past Surgical History:  Procedure Laterality Date   ABDOMINAL HYSTERECTOMY     ABDOMINAL SURGERY     BIOPSY  05/28/2019   Procedure: BIOPSY;  Surgeon: Danie Binder, MD;  [facility];  Service: Endoscopy;;  random colon/gastric/duodenum   COLONOSCOPY  2005 MAC   TICs, IH   COLONOSCOPY N/A 03/08/2014   Procedure: COLONOSCOPY;  Surgeon: Danie Binder, MD;  [facility];  Service: Endoscopy;  Laterality: N/A;  1015   COLONOSCOPY WITH PROPOFOL N/A 05/28/2019   Procedure: COLONOSCOPY WITH PROPOFOL;  Surgeon: Danie Binder, MD;  [facility];  Service: Endoscopy;  Laterality: N/A;  9:15am   CYSTOSTOMY  11/22/2011   Procedure: CYSTOSTOMY SUPRAPUBIC;  Surgeon: Marissa Nestle, MD;  [facility];  Service: Urology;  Laterality: N/A;   ESOPHAGOGASTRODUODENOSCOPY N/A 03/08/2014   Procedure: ESOPHAGOGASTRODUODENOSCOPY (EGD);  Surgeon: Danie Binder, MD;  [facility];  Service: Endoscopy;  Laterality: N/A;   ESOPHAGOGASTRODUODENOSCOPY     ESOPHAGOGASTRODUODENOSCOPY (EGD) WITH PROPOFOL N/A 05/28/2019   Procedure: ESOPHAGOGASTRODUODENOSCOPY (EGD) WITH PROPOFOL;  Surgeon: Danie Binder, MD;  [facility];  Service: Endoscopy;  Laterality: N/A;   GIVENS CAPSULE STUDY N/A 06/15/2019   Procedure: GIVENS CAPSULE STUDY;  Surgeon: Danie Binder, MD;  [facility];  Service: Endoscopy;  Laterality: N/A;  7:30am   HALLUX VALGUS CORRECTION     bilateral foot surgery for bone repairs-multiple   LOBECTOMY Right 10/15/2016   Procedure: RIGHT UPPER LOBECTOMY;  Surgeon: [name], MD;  Location: [facility];  Service: Thoracic;  Laterality: Right;   LYMPH NODE DISSECTION Right 10/15/2016   Procedure: LYMPH NODE DISSECTION;  Surgeon: [name], MD;  Location: [facility];  Service: Thoracic;  Laterality: Right;   POLYPECTOMY  05/28/2019   Procedure: POLYPECTOMY;  Surgeon: Danie Binder, MD;  [facility];  Service: Endoscopy;;   SAVORY DILATION N/A 05/28/2019   Procedure: SAVORY DILATION;  Surgeon: Danie Binder, MD;  [facility];  Service: Endoscopy;  Laterality: N/A;  15/16/17   VAGINA RECONSTRUCTION SURGERY     tvt 2006- [place] (VATS)/WEDGE RESECTION Right 10/15/2016   Procedure: RIGHT VIDEO ASSISTED THORACOSCOPY (VATS)/WEDGE RESECTION;  Surgeon: [name], MD;  Location: [facility];  Service: Thoracic;  Laterality: Right;    There were no vitals filed for this visit.   Subjective Assessment - 03/09/21 1608      Subjective Pt reports she attended a flexibility class with an instructor and felt good benefit from this. No severe SI pain at this time    Pertinent History SB, depression, GERD    Limitations Sitting    Diagnostic tests no recent imaging    Patient Stated Goals to learn exercises to help manage her symptoms to avoid surgery    Currently in Pain? No/denies    Pain Score 0-No pain                [facility] PT Assessment - 03/09/21 0001       Assessment   Medical Diagnosis LX/SIJ pain                           [facility] Adult PT Treatment/Exercise - 03/09/21 0001       Lumbar Exercises: Standing   Other Standing Lumbar Exercises paloff press 1x10 4 plates    Other Standing Lumbar Exercises counter top push-ups 2x10. Push-up plank on counter with hip flexion 2x10      Lumbar Exercises: Supine   Bent Knee Raise 10 reps    Dead Bug 10 reps    Dead Bug Limitations with swiss ball    Bridge with [employer] 20 reps    Bridge with [employer] Limitations 10x ankle PF, DF    Isometric Hip Flexion 10 reps;2 seconds                    PT Education - 03/09/21 1638     Education Details education on HEP additions    Person(s) Educated Patient    Methods Explanation    Comprehension Verbalized understanding              PT Short Term Goals - 02/21/21 1207       PT SHORT TERM GOAL #1   Title Patient will be able to demonstrate painfree lumbar ROM    Time 3    Period Weeks    Status New    Target Date 03/14/21      PT SHORT TERM GOAL #2   Title Patient will report at least 50% improvement in overall symptoms and/or function to demonstrate improved functional mobility    Time 3    Period Weeks    Status New    Target Date 03/14/21      PT SHORT TERM GOAL #3   Title Patient will be independent in self management strategies to improve quality of life and functional outcomes.    Time 3    Period Weeks    Status New    Target Date 03/14/21                PT Long Term Goals - 02/21/21 1208  PT LONG TERM GOAL #1   Title Patient will be able to sleep without waking up in pain to improve QOL    Time 6    Period Weeks    Status New    Target Date 04/04/21      PT LONG TERM GOAL #2   Title Patient will report at least 75% improvement in overall symptoms and/or function to demonstrate improved functional mobility    Time 6    Period Weeks    Status New    Target Date 04/04/21      PT LONG TERM GOAL #3   Title Patient will improve on FOTO score to meet predicted outcomes to demonstrate improved functional mobility.    Time 6    Period Weeks    Status New    Target Date 04/04/21                   Plan - 03/09/21 1638     Clinical Impression Statement Tolerating increased exercises well. Some discomfort in low back with dying bug and paloff press with stabilization exercises in general. Continued session sindicated to improve core strength/stabilization to reduce pain with functional lifts    Personal Factors and Comorbidities Comorbidity 1    Comorbidities DB    Examination-Activity Limitations Dressing;Transfers;Carry;Bend;Squat;Sleep    Examination-Participation Restrictions Meal Prep;Driving;Shop;Community Activity;Cleaning    Stability/Clinical Decision Making Stable/Uncomplicated    Rehab Potential Good    PT Frequency 2x / week    PT Duration 6 weeks    PT Treatment/Interventions ADLs/Self Care Home Management;Cryotherapy;Moist Heat;Electrical Stimulation;Traction;Therapeutic exercise;Therapeutic activities;Manual techniques;Neuromuscular re-education;Patient/family education;Dry needling;Joint Manipulations;Spinal Manipulations;Passive range of motion    PT Next Visit Plan assess hip abd/ext/add/IR/ER strength and ROM , address left lateral shift, spinal ROM/stretch, core and hip strengthening, SIJ manuvers as indicated    PT Home Exercise Plan hipabd/add isometric, MET for SIJ    Consulted  and Agree with Plan of Care Patient             Patient will benefit from skilled therapeutic intervention in order to improve the following deficits and impairments:  Decreased strength, Decreased activity tolerance, Pain, Decreased range of motion  Visit Diagnosis: Muscle weakness (generalized)  Sacroiliac joint pain     Problem List Patient Active Problem List   Diagnosis Date Noted   Chronic pain 01/02/2021   Decreased estrogen level 01/02/2021   Exostosis of skull 01/02/2021   Insomnia 01/02/2021   Long term (current) use of insulin ([place]) 01/02/2021   Moderate recurrent major depression ([place]) 01/02/2021   Pure hypercholesterolemia 01/02/2021   Ventricular premature beats 01/02/2021   Nausea without vomiting 01/06/2020   Abdominal pain 01/06/2020   Obesity 07/15/2019   Diarrhea    History of colonic polyps 03/09/2019   Microcytic anemia 03/09/2019   Dysphagia 03/09/2019   Adenocarcinoma of right lung, stage 1 ([place]) 11/29/2016   S/P lobectomy of lung 10/15/2016   Hyperlipidemia 10/02/2016   Lung nodule 10/02/2016   Adrenal adenoma, right 10/02/2016   Diabetes ([place]) 06/16/2016   HTN (hypertension) 05/31/2016   Increased frequency of urination 07/16/2014   Change in bowel habits 03/03/2014   Depression 10/13/2013   Fractured lateral malleolus 08/12/2012   Left knee sprain 08/12/2012    Toniann Fail 03/09/2021, 4:39 PM  [facility] [address] [place], [place], [address] Phone: [phone]   Fax:  [phone]  Name: [name] MRN: [id number] Date of Birth: [birth year]

## 2021-03-10 ENCOUNTER — Other Ambulatory Visit: Payer: Self-pay | Admitting: Endocrinology

## 2021-03-10 ENCOUNTER — Ambulatory Visit (HOSPITAL_COMMUNITY): Payer: Medicare Other | Admitting: Physical Therapy

## 2021-03-10 DIAGNOSIS — M6281 Muscle weakness (generalized): Secondary | ICD-10-CM | POA: Diagnosis not present

## 2021-03-10 DIAGNOSIS — M533 Sacrococcygeal disorders, not elsewhere classified: Secondary | ICD-10-CM

## 2021-03-10 NOTE — Therapy (Signed)
[facility] [address] [place], [place], [address] Phone: [phone]   Fax:  [phone]  Physical Therapy Treatment  Patient Details  Name: [name] MRN: [id number] Date of Birth: August 22, 1955 Referring Provider (PT): Arther Abbott MD   Encounter Date: 03/10/2021   PT End of Session - 03/10/21 1429     Visit Number 4    Number of Visits 12    Date for PT Re-Evaluation 04/04/21    Authorization Type medicare primary and aetna NAP 80 VL    Progress Note Due on Visit 10    PT Start Time 1403    PT Stop Time 1445    PT Time Calculation (min) 42 min    Activity Tolerance Patient tolerated treatment well    Behavior During Therapy [facility] for tasks assessed/performed             Past Medical History:  Diagnosis Date   Adenocarcinoma of right lung, stage 1 ([place]) 11/29/2016   Cancer ([place])    Chronic pain    Depression    takes Lexapro daily   Diabetes mellitus    takes Metformin daily and has an insulin pump.Fasting blood sugar runs250   GERD (gastroesophageal reflux disease)    takes Pantoprazole daily   History of colon polyps    benign   Hyperlipidemia    takes Atorvastatin daily   Hypertension    takes Amlodipine and Lisinopril daily   Insomnia    takes Trazodone nightly   Nocturia    Thyroid disease    Urinary frequency    Urinary urgency     Past Surgical History:  Procedure Laterality Date   ABDOMINAL HYSTERECTOMY     ABDOMINAL SURGERY     BIOPSY  05/28/2019   Procedure: BIOPSY;  Surgeon: Danie Binder, MD;  [facility];  Service: Endoscopy;;  random colon/gastric/duodenum   COLONOSCOPY  2005 MAC   TICs, IH   COLONOSCOPY N/A 03/08/2014   Procedure: COLONOSCOPY;  Surgeon: Danie Binder, MD;  [facility];  Service: Endoscopy;  Laterality: N/A;  1015   COLONOSCOPY WITH PROPOFOL N/A 05/28/2019   Procedure: COLONOSCOPY WITH PROPOFOL;  Surgeon: Danie Binder, MD;  [facility];  Service: Endoscopy;  Laterality: N/A;  9:15am   CYSTOSTOMY  11/22/2011   Procedure: CYSTOSTOMY SUPRAPUBIC;  Surgeon: Marissa Nestle, MD;  [facility];  Service: Urology;  Laterality: N/A;   ESOPHAGOGASTRODUODENOSCOPY N/A 03/08/2014   Procedure: ESOPHAGOGASTRODUODENOSCOPY (EGD);  Surgeon: Danie Binder, MD;  [facility];  Service: Endoscopy;  Laterality: N/A;   ESOPHAGOGASTRODUODENOSCOPY     ESOPHAGOGASTRODUODENOSCOPY (EGD) WITH PROPOFOL N/A 05/28/2019   Procedure: ESOPHAGOGASTRODUODENOSCOPY (EGD) WITH PROPOFOL;  Surgeon: Danie Binder, MD;  [facility];  Service: Endoscopy;  Laterality: N/A;   GIVENS CAPSULE STUDY N/A 06/15/2019   Procedure: GIVENS CAPSULE STUDY;  Surgeon: Danie Binder, MD;  [facility];  Service: Endoscopy;  Laterality: N/A;  7:30am   HALLUX VALGUS CORRECTION     bilateral foot surgery for bone repairs-multiple   LOBECTOMY Right 10/15/2016   Procedure: RIGHT UPPER LOBECTOMY;  Surgeon: [name], MD;  Location: [facility];  Service: Thoracic;  Laterality: Right;   LYMPH NODE DISSECTION Right 10/15/2016   Procedure: LYMPH NODE DISSECTION;  Surgeon: [name], MD;  Location: [facility];  Service: Thoracic;  Laterality: Right;   POLYPECTOMY  05/28/2019   Procedure: POLYPECTOMY;  Surgeon: Danie Binder, MD;  [facility];  Service: Endoscopy;;   SAVORY DILATION N/A 05/28/2019   Procedure: SAVORY DILATION;  Surgeon: Danie Binder, MD;  [facility];  Service: Endoscopy;  Laterality: N/A;  15/16/17   VAGINA RECONSTRUCTION SURGERY     tvt 2006- [place] (VATS)/WEDGE RESECTION Right 10/15/2016   Procedure: RIGHT VIDEO ASSISTED THORACOSCOPY (VATS)/WEDGE RESECTION;  Surgeon: [name], MD;  Location: [facility];  Service: Thoracic;  Laterality: Right;    There were no vitals filed for this visit.   Subjective Assessment - 03/10/21 1404      Subjective Pt states that she is having pain today .She states mornings are worse    Pertinent History SB, depression, GERD    Limitations Sitting    Diagnostic tests no recent imaging    Patient Stated Goals to learn exercises to help manage her symptoms to avoid surgery    Currently in Pain? Yes    Pain Score 3     Pain Location Back    Pain Orientation Lower    Pain Descriptors / Indicators Pressure    Pain Type Chronic pain                [facility] PT Assessment - 03/10/21 0001       Strength   Right Hip Extension 5/5    Right Hip External Rotation  4+/5    Right Hip Internal Rotation 4-/5    Right Hip ABduction 4+/5    Right Hip ADduction 4/5    Left Hip Extension 5/5    Left Hip External Rotation 4+/5    Left Hip Internal Rotation 4+/5    Left Hip ABduction 5/5    Left Hip ADduction 4/5                           [facility] Adult PT Treatment/Exercise - 03/10/21 0001       Exercises   Exercises Lumbar      Lumbar Exercises: Supine   Ab Set 10 reps    Dead Bug 10 reps    Bridge 15 reps    Other Supine Lumbar Exercises isometric hip ab/adduction x 15      Lumbar Exercises: Prone   Other Prone Lumbar Exercises glut set/ heel set x 10 reps hold x 10"      Modalities   Modalities Moist Heat      Manual Therapy   Manual Therapy Muscle Energy Technique    Manual therapy comments done seperate from all other aspects    Muscle Energy Technique to correct SI dysfunction.                      PT Short Term Goals - 03/10/21 1446       PT SHORT TERM GOAL #1   Title Patient will be able to demonstrate painfree lumbar ROM    Time 3    Period Weeks    Status On-going    Target Date 03/14/21      PT SHORT TERM GOAL #2   Title Patient will report at least 50% improvement in overall symptoms and/or function to demonstrate improved functional mobility    Time 3    Period Weeks    Status On-going    Target Date 03/14/21      PT SHORT  TERM GOAL #3   Title Patient  will be independent in self management strategies to improve quality of life and functional outcomes.    Time 3    Period Weeks    Status On-going    Target Date 03/14/21               PT Long Term Goals - 03/10/21 1446       PT LONG TERM GOAL #1   Status On-going      PT LONG TERM GOAL #2   Status On-going      PT LONG TERM GOAL #3   Status On-going                   Plan - 03/10/21 1429     Clinical Impression Statement SI checked with RT side anteriorly rotated.  Multiple attempts of muscle energy did not fully correct SI.  Therapist placed [place] on pt lumbar area for 5 minutes to attempt to relax mm prior to 4th attempt.  This attempt neede two tries but demonstrated good alignment.    Personal Factors and Comorbidities Comorbidity 1    Comorbidities DB    Examination-Activity Limitations Dressing;Transfers;Carry;Bend;Squat;Sleep    Examination-Participation Restrictions Meal Prep;Driving;Shop;Community Activity;Cleaning    Stability/Clinical Decision Making Stable/Uncomplicated    Rehab Potential Good    PT Frequency 2x / week    PT Duration 6 weeks    PT Treatment/Interventions ADLs/Self Care Home Management;Cryotherapy;Moist Heat;Electrical Stimulation;Traction;Therapeutic exercise;Therapeutic activities;Manual techniques;Neuromuscular re-education;Patient/family education;Dry needling;Joint Manipulations;Spinal Manipulations;Passive range of motion    PT Next Visit Plan assess SI,   and ROM , address left lateral shift, spinal ROM/stretch, core and hip strengthening,    PT Home Exercise Plan hipabd/add isometric, MET for SIJ    Consulted and Agree with Plan of Care Patient             Patient will benefit from skilled therapeutic intervention in order to improve the following deficits and impairments:  Decreased strength, Decreased activity tolerance, Pain, Decreased range of motion  Visit Diagnosis: Muscle weakness  (generalized)  Sacroiliac joint pain     Problem List Patient Active Problem List   Diagnosis Date Noted   Chronic pain 01/02/2021   Decreased estrogen level 01/02/2021   Exostosis of skull 01/02/2021   Insomnia 01/02/2021   Long term (current) use of insulin ([place]) 01/02/2021   Moderate recurrent major depression ([place]) 01/02/2021   Pure hypercholesterolemia 01/02/2021   Ventricular premature beats 01/02/2021   Nausea without vomiting 01/06/2020   Abdominal pain 01/06/2020   Obesity 07/15/2019   Diarrhea    History of colonic polyps 03/09/2019   Microcytic anemia 03/09/2019   Dysphagia 03/09/2019   Adenocarcinoma of right lung, stage 1 ([place]) 11/29/2016   S/P lobectomy of lung 10/15/2016   Hyperlipidemia 10/02/2016   Lung nodule 10/02/2016   Adrenal adenoma, right 10/02/2016   Diabetes ([place]) 06/16/2016   HTN (hypertension) 05/31/2016   Increased frequency of urination 07/16/2014   Change in bowel habits 03/03/2014   Depression 10/13/2013   Fractured lateral malleolus 08/12/2012   Left knee sprain 08/12/2012   Rayetta Humphrey, PT CLT [phone]  03/10/2021, 2:47 PM  [facility] [address] [place], [place], [address] Phone: [phone]   Fax:  [phone]  Name: [name] MRN: [id number] Date of Birth: 1956-07-18

## 2021-03-14 ENCOUNTER — Ambulatory Visit (HOSPITAL_COMMUNITY): Payer: Medicare Other | Admitting: Physical Therapy

## 2021-03-14 ENCOUNTER — Encounter (HOSPITAL_COMMUNITY): Payer: Self-pay | Admitting: Physical Therapy

## 2021-03-14 ENCOUNTER — Other Ambulatory Visit: Payer: Self-pay

## 2021-03-14 DIAGNOSIS — M6281 Muscle weakness (generalized): Secondary | ICD-10-CM | POA: Diagnosis not present

## 2021-03-14 DIAGNOSIS — M533 Sacrococcygeal disorders, not elsewhere classified: Secondary | ICD-10-CM

## 2021-03-14 NOTE — Therapy (Signed)
[facility] [address] [place], [place], [address] Phone: [phone]   Fax:  [phone]  Physical Therapy Treatment  Patient Details  Name: [name] MRN: [id number] Date of Birth: 10/21/1955 Referring Provider (PT): Arther Abbott MD   Encounter Date: 03/14/2021   PT End of Session - 03/14/21 1611     Visit Number 5    Number of Visits 12    Date for PT Re-Evaluation 04/04/21    Authorization Type medicare primary and aetna NAP 80 VL    Progress Note Due on Visit 10    PT Start Time 1613    PT Stop Time 1653    PT Time Calculation (min) 40 min    Activity Tolerance Patient tolerated treatment well    Behavior During Therapy [facility] for tasks assessed/performed             Past Medical History:  Diagnosis Date   Adenocarcinoma of right lung, stage 1 ([place]) 11/29/2016   Cancer ([place])    Chronic pain    Depression    takes Lexapro daily   Diabetes mellitus    takes Metformin daily and has an insulin pump.Fasting blood sugar runs250   GERD (gastroesophageal reflux disease)    takes Pantoprazole daily   History of colon polyps    benign   Hyperlipidemia    takes Atorvastatin daily   Hypertension    takes Amlodipine and Lisinopril daily   Insomnia    takes Trazodone nightly   Nocturia    Thyroid disease    Urinary frequency    Urinary urgency     Past Surgical History:  Procedure Laterality Date   ABDOMINAL HYSTERECTOMY     ABDOMINAL SURGERY     BIOPSY  05/28/2019   Procedure: BIOPSY;  Surgeon: Danie Binder, MD;  [facility];  Service: Endoscopy;;  random colon/gastric/duodenum   COLONOSCOPY  2005 MAC   TICs, IH   COLONOSCOPY N/A 03/08/2014   Procedure: COLONOSCOPY;  Surgeon: Danie Binder, MD;  [facility];  Service: Endoscopy;  Laterality: N/A;  1015   COLONOSCOPY WITH PROPOFOL N/A 05/28/2019   Procedure: COLONOSCOPY WITH PROPOFOL;  Surgeon: Danie Binder, MD;  [facility];  Service: Endoscopy;  Laterality: N/A;  9:15am   CYSTOSTOMY  11/22/2011   Procedure: CYSTOSTOMY SUPRAPUBIC;  Surgeon: Marissa Nestle, MD;  [facility];  Service: Urology;  Laterality: N/A;   ESOPHAGOGASTRODUODENOSCOPY N/A 03/08/2014   Procedure: ESOPHAGOGASTRODUODENOSCOPY (EGD);  Surgeon: Danie Binder, MD;  [facility];  Service: Endoscopy;  Laterality: N/A;   ESOPHAGOGASTRODUODENOSCOPY     ESOPHAGOGASTRODUODENOSCOPY (EGD) WITH PROPOFOL N/A 05/28/2019   Procedure: ESOPHAGOGASTRODUODENOSCOPY (EGD) WITH PROPOFOL;  Surgeon: Danie Binder, MD;  [facility];  Service: Endoscopy;  Laterality: N/A;   GIVENS CAPSULE STUDY N/A 06/15/2019   Procedure: GIVENS CAPSULE STUDY;  Surgeon: Danie Binder, MD;  [facility];  Service: Endoscopy;  Laterality: N/A;  7:30am   HALLUX VALGUS CORRECTION     bilateral foot surgery for bone repairs-multiple   LOBECTOMY Right 10/15/2016   Procedure: RIGHT UPPER LOBECTOMY;  Surgeon: [name], MD;  Location: [facility];  Service: Thoracic;  Laterality: Right;   LYMPH NODE DISSECTION Right 10/15/2016   Procedure: LYMPH NODE DISSECTION;  Surgeon: [name], MD;  Location: [facility];  Service: Thoracic;  Laterality: Right;   POLYPECTOMY  05/28/2019   Procedure: POLYPECTOMY;  Surgeon: Danie Binder, MD;  [facility];  Service: Endoscopy;;   SAVORY DILATION N/A 05/28/2019   Procedure: SAVORY DILATION;  Surgeon: Danie Binder, MD;  [facility];  Service: Endoscopy;  Laterality: N/A;  15/16/17   VAGINA RECONSTRUCTION SURGERY     tvt 2006- [place] (VATS)/WEDGE RESECTION Right 10/15/2016   Procedure: RIGHT VIDEO ASSISTED THORACOSCOPY (VATS)/WEDGE RESECTION;  Surgeon: [name], MD;  Location: [facility];  Service: Thoracic;  Laterality: Right;    There were no vitals filed for this visit.   Subjective Assessment - 03/14/21 1622      Subjective States that when she came Friday she got her SIJ back in place and she feels so much better. States she has some mild discomfort in her low back. States that she started to take a flexibility class once a week. States that right now she has only pressure in her back no pain    Pertinent History SB, depression, GERD    Limitations Sitting    Diagnostic tests no recent imaging    Patient Stated Goals to learn exercises to help manage her symptoms to avoid surgery    Currently in Pain? Yes    Pain Score --   just pressure in her low back               [facility] PT Assessment - 03/14/21 0001       Assessment   Medical Diagnosis LX/SIJ pain                           [facility] Adult PT Treatment/Exercise - 03/14/21 0001       Lumbar Exercises: Supine   Other Supine Lumbar Exercises HIP IR/ER x30 each and bilateral - relax and breath, deadbugs 2x10 5" holds isometric    Other Supine Lumbar Exercises gaenslens stretch rigth flexed and left extended x3 60" holds; kneeling lateral lunge stretch x15 5" holds B                      PT Short Term Goals - 03/10/21 1446       PT SHORT TERM GOAL #1   Title Patient will be able to demonstrate painfree lumbar ROM    Time 3    Period Weeks    Status On-going    Target Date 03/14/21      PT SHORT TERM GOAL #2   Title Patient will report at least 50% improvement in overall symptoms and/or function to demonstrate improved functional mobility    Time 3    Period Weeks    Status On-going    Target Date 03/14/21      PT SHORT TERM GOAL #3   Title Patient will be independent in self management strategies to improve quality of life and functional outcomes.    Time 3    Period Weeks    Status On-going    Target Date 03/14/21               PT Long Term Goals - 03/14/21 1701       PT LONG TERM GOAL #1   Title Patient will be able to sleep without waking up in pain to improve QOL    Time 6     Period Weeks    Status New      PT LONG TERM GOAL #2   Title Patient  will report at least 75% improvement in overall symptoms and/or function to demonstrate improved functional mobility    Time 6    Period Weeks    Status New      PT LONG TERM GOAL #3   Title Patient will improve on FOTO score to meet predicted outcomes to demonstrate improved functional mobility.    Time 6    Period Weeks    Status New                   Plan - 03/14/21 1659     Clinical Impression Statement Session focused on hip and lumbar mobility to reinforce flexibility in hip musculature. Left hip internal rotation and right hip external rotation most restricted. Added stretches and lunge stretch with end range muscle activation which was tolerated well but patient required verbal and tactile cues for form. Patient with no increase in symptoms  end of session and improved mobility on right with FABER stretch. Added new stretches to HEP.    Personal Factors and Comorbidities Comorbidity 1    Comorbidities DB    Examination-Activity Limitations Dressing;Transfers;Carry;Bend;Squat;Sleep    Examination-Participation Restrictions Meal Prep;Driving;Shop;Community Activity;Cleaning    Stability/Clinical Decision Making Stable/Uncomplicated    Rehab Potential Good    PT Frequency 2x / week    PT Duration 6 weeks    PT Treatment/Interventions ADLs/Self Care Home Management;Cryotherapy;Moist Heat;Electrical Stimulation;Traction;Therapeutic exercise;Therapeutic activities;Manual techniques;Neuromuscular re-education;Patient/family education;Dry needling;Joint Manipulations;Spinal Manipulations;Passive range of motion    PT Next Visit Plan assess SI,   and ROM , address left lateral shift, spinal ROM/stretch, core and hip strengthening,    PT Home Exercise Plan hipabd/add isometric, MET for SIJ; 8/16 gaenslens stretch left, hbent knee fall ins and outs, lunge kneel stretch, deadbug isometric    Consulted and Agree  with Plan of Care Patient             Patient will benefit from skilled therapeutic intervention in order to improve the following deficits and impairments:  Decreased strength, Decreased activity tolerance, Pain, Decreased range of motion  Visit Diagnosis: Muscle weakness (generalized)  Sacroiliac joint pain     Problem List Patient Active Problem List   Diagnosis Date Noted   Chronic pain 01/02/2021   Decreased estrogen level 01/02/2021   Exostosis of skull 01/02/2021   Insomnia 01/02/2021   Long term (current) use of insulin ([place]) 01/02/2021   Moderate recurrent major depression ([place]) 01/02/2021   Pure hypercholesterolemia 01/02/2021   Ventricular premature beats 01/02/2021   Nausea without vomiting 01/06/2020   Abdominal pain 01/06/2020   Obesity 07/15/2019   Diarrhea    History of colonic polyps 03/09/2019   Microcytic anemia 03/09/2019   Dysphagia 03/09/2019   Adenocarcinoma of right lung, stage 1 ([place]) 11/29/2016   S/P lobectomy of lung 10/15/2016   Hyperlipidemia 10/02/2016   Lung nodule 10/02/2016   Adrenal adenoma, right 10/02/2016   Diabetes ([place]) 06/16/2016   HTN (hypertension) 05/31/2016   Increased frequency of urination 07/16/2014   Change in bowel habits 03/03/2014   Depression 10/13/2013   Fractured lateral malleolus 08/12/2012   Left knee sprain 08/12/2012   5:01 PM, 03/14/21 Jerene Pitch, DPT Physical Therapy with [facility]  [phone] office   [facility] [address] [place], [place], [address] Phone: [phone]   Fax:  [phone]  Name: [name] MRN: [id number] Date of Birth: 04-Jul-1956

## 2021-03-16 ENCOUNTER — Other Ambulatory Visit: Payer: Self-pay

## 2021-03-16 ENCOUNTER — Ambulatory Visit (HOSPITAL_COMMUNITY): Payer: Medicare Other | Admitting: Physical Therapy

## 2021-03-16 DIAGNOSIS — M533 Sacrococcygeal disorders, not elsewhere classified: Secondary | ICD-10-CM

## 2021-03-16 DIAGNOSIS — M6281 Muscle weakness (generalized): Secondary | ICD-10-CM | POA: Diagnosis not present

## 2021-03-16 NOTE — Therapy (Signed)
[facility] [place], [place], [address] Phone: [phone]   Fax:  [phone]  Physical Therapy Treatment  Patient Details  Name: [name] MRN: [id number] Date of Birth: 04-28-1956 Referring Provider (PT): Arther Abbott MD   Encounter Date: 03/16/2021   PT End of Session - 03/16/21 1057     Visit Number 6    Number of Visits 12    Date for PT Re-Evaluation 04/04/21    Authorization Type medicare primary and aetna NAP 80 VL    Progress Note Due on Visit 10    PT Start Time 4801    PT Stop Time 1115    PT Time Calculation (min) 40 min    Activity Tolerance Patient tolerated treatment well    Behavior During Therapy [facility] for tasks assessed/performed             Past Medical History:  Diagnosis Date   Adenocarcinoma of right lung, stage 1 ([place]) 11/29/2016   Cancer ([place])    Chronic pain    Depression    takes Lexapro daily   Diabetes mellitus    takes Metformin daily and has an insulin pump.Fasting blood sugar runs250   GERD (gastroesophageal reflux disease)    takes Pantoprazole daily   History of colon polyps    benign   Hyperlipidemia    takes Atorvastatin daily   Hypertension    takes Amlodipine and Lisinopril daily   Insomnia    takes Trazodone nightly   Nocturia    Thyroid disease    Urinary frequency    Urinary urgency     Past Surgical History:  Procedure Laterality Date   ABDOMINAL HYSTERECTOMY     ABDOMINAL SURGERY     BIOPSY  05/28/2019   Procedure: BIOPSY;  Surgeon: Danie Binder, MD;  [facility];  Service: Endoscopy;;  random colon/gastric/duodenum   COLONOSCOPY  2005 MAC   TICs, IH   COLONOSCOPY N/A 03/08/2014   Procedure: COLONOSCOPY;  Surgeon: Danie Binder, MD;  [facility];  Service: Endoscopy;  Laterality: N/A;  1015   COLONOSCOPY WITH PROPOFOL N/A 05/28/2019   Procedure: COLONOSCOPY WITH PROPOFOL;  Surgeon: Danie Binder, MD;  [facility];  Service: Endoscopy;  Laterality: N/A;  9:15am   CYSTOSTOMY  11/22/2011   Procedure: CYSTOSTOMY SUPRAPUBIC;  Surgeon: Marissa Nestle, MD;  [facility];  Service: Urology;  Laterality: N/A;   ESOPHAGOGASTRODUODENOSCOPY N/A 03/08/2014   Procedure: ESOPHAGOGASTRODUODENOSCOPY (EGD);  Surgeon: Danie Binder, MD;  [facility];  Service: Endoscopy;  Laterality: N/A;   ESOPHAGOGASTRODUODENOSCOPY     ESOPHAGOGASTRODUODENOSCOPY (EGD) WITH PROPOFOL N/A 05/28/2019   Procedure: ESOPHAGOGASTRODUODENOSCOPY (EGD) WITH PROPOFOL;  Surgeon: Danie Binder, MD;  [facility];  Service: Endoscopy;  Laterality: N/A;   GIVENS CAPSULE STUDY N/A 06/15/2019   Procedure: GIVENS CAPSULE STUDY;  Surgeon: Danie Binder, MD;  [facility];  Service: Endoscopy;  Laterality: N/A;  7:30am   HALLUX VALGUS CORRECTION     bilateral foot surgery for bone repairs-multiple   LOBECTOMY Right 10/15/2016   Procedure: RIGHT UPPER LOBECTOMY;  Surgeon: [name], MD;  Location: [facility];  Service: Thoracic;  Laterality: Right;   LYMPH NODE DISSECTION Right 10/15/2016   Procedure: LYMPH NODE DISSECTION;  Surgeon: [name], MD;  Location: [facility];  Service: Thoracic;  Laterality: Right;   POLYPECTOMY  05/28/2019   Procedure: POLYPECTOMY;  Surgeon: Danie Binder, MD;  [facility];  Service: Endoscopy;;   SAVORY DILATION N/A 05/28/2019   Procedure: SAVORY DILATION;  Surgeon: Danie Binder, MD;  [facility];  Service: Endoscopy;  Laterality: N/A;  15/16/17   VAGINA RECONSTRUCTION SURGERY     tvt 2006- [place] (VATS)/WEDGE RESECTION Right 10/15/2016   Procedure: RIGHT VIDEO ASSISTED THORACOSCOPY (VATS)/WEDGE RESECTION;  Surgeon: [name], MD;  Location: [facility];  Service: Thoracic;  Laterality: Right;    There were no vitals filed for this visit.   Subjective Assessment - 03/16/21 1035      Subjective PT states that she is still feeling good no pain just some pressure.    Pertinent History SB, depression, GERD    Limitations Sitting    Diagnostic tests no recent imaging    Patient Stated Goals to learn exercises to help manage her symptoms to avoid surgery    Currently in Pain? No/denies                               [facility] Adult PT Treatment/Exercise - 03/16/21 0001       Exercises   Exercises Lumbar      Lumbar Exercises: Standing   Scapular Retraction Strengthening;Both;10 reps    Row Strengthening;Both;15 reps    Shoulder Extension Strengthening;Both;10 reps    Other Standing Lumbar Exercises paloff press 1x10 4 plates      Lumbar Exercises: Supine   Dead Bug 10 reps      Lumbar Exercises: Sidelying   Clam Both;15 reps    Hip Abduction Right;15 reps    Hip Abduction Weights (lbs) on forarms    Other Sidelying Lumbar Exercises hip extension x 15   on forearm, knee bent to 90 hip pulses into extension     Lumbar Exercises: Prone   Opposite Arm/Leg Raise Left arm/Right leg;Right arm/Left leg;5 reps      Manual Therapy   Manual Therapy Muscle Energy Technique    Manual therapy comments done seperate from all other aspects    Muscle Energy Technique to correct SI dysfunction.                    PT Education - 03/16/21 1110     Education Details self mobwith  Rt knee to chest and Lt single leg bridge; t band posture exercies    Person(s) Educated Patient    Methods Explanation;Handout              PT Short Term Goals - 03/10/21 1446       PT SHORT TERM GOAL #1   Title Patient will be able to demonstrate painfree lumbar ROM    Time 3    Period Weeks    Status On-going    Target Date 03/14/21      PT SHORT TERM GOAL #2   Title Patient will report at least 50% improvement in overall symptoms and/or function to demonstrate improved functional mobility    Time 3    Period Weeks    Status On-going    Target Date  03/14/21      PT SHORT TERM GOAL #3   Title Patient will be independent in self management strategies to improve quality of life and functional outcomes.    Time 3    Period Weeks    Status On-going    Target  Date 03/14/21               PT Long Term Goals - 03/14/21 1701       PT LONG TERM GOAL #1   Title Patient will be able to sleep without waking up in pain to improve QOL    Time 6    Period Weeks    Status New      PT LONG TERM GOAL #2   Title Patient will report at least 75% improvement in overall symptoms and/or function to demonstrate improved functional mobility    Time 6    Period Weeks    Status New      PT LONG TERM GOAL #3   Title Patient will improve on FOTO score to meet predicted outcomes to demonstrate improved functional mobility.    Time 6    Period Weeks    Status New                   Plan - 03/16/21 1111     Clinical Impression Statement Si checked, slightly misaligned , however this treatment SI was easily adjusted.  Pt educated on self correction techniques. Therapist educated pt on tband postural exercises with good demonstration.  Pt requests a break from therapy to see if she can maintain correct alignment of her SI jt on her own and to be rechecked in two weeks.  If pt has correct alignment she will be discharged.    Personal Factors and Comorbidities Comorbidity 1    Examination-Activity Limitations Dressing;Transfers;Carry;Bend;Squat;Sleep    Examination-Participation Restrictions Meal Prep;Driving;Shop;Community Activity;Cleaning    Stability/Clinical Decision Making Stable/Uncomplicated    Clinical Decision Making Low    Rehab Potential Good    PT Frequency 2x / week    PT Duration 6 weeks    PT Treatment/Interventions ADLs/Self Care Home Management;Cryotherapy;Moist Heat;Electrical Stimulation;Traction;Therapeutic exercise;Therapeutic activities;Manual techniques;Neuromuscular re-education;Patient/family education;Dry  needling;Joint Manipulations;Spinal Manipulations;Passive range of motion    PT Next Visit Plan Assess SI if no adjustment needed may discharge.             Patient will benefit from skilled therapeutic intervention in order to improve the following deficits and impairments:  Decreased strength, Decreased activity tolerance, Pain, Decreased range of motion  Visit Diagnosis: Muscle weakness (generalized)  Sacroiliac joint pain     Problem List Patient Active Problem List   Diagnosis Date Noted   Chronic pain 01/02/2021   Decreased estrogen level 01/02/2021   Exostosis of skull 01/02/2021   Insomnia 01/02/2021   Long term (current) use of insulin ([place]) 01/02/2021   Moderate recurrent major depression ([place]) 01/02/2021   Pure hypercholesterolemia 01/02/2021   Ventricular premature beats 01/02/2021   Nausea without vomiting 01/06/2020   Abdominal pain 01/06/2020   Obesity 07/15/2019   Diarrhea    History of colonic polyps 03/09/2019   Microcytic anemia 03/09/2019   Dysphagia 03/09/2019   Adenocarcinoma of right lung, stage 1 ([place]) 11/29/2016   S/P lobectomy of lung 10/15/2016   Hyperlipidemia 10/02/2016   Lung nodule 10/02/2016   Adrenal adenoma, right 10/02/2016   Diabetes ([place]) 06/16/2016   HTN (hypertension) 05/31/2016   Increased frequency of urination 07/16/2014   Change in bowel habits 03/03/2014   Depression 10/13/2013   Fractured lateral malleolus 08/12/2012   Left knee sprain 08/12/2012   Rayetta Humphrey, PT [facility] [phone]  03/16/2021, 11:15 AM  [facility] [place], [place], [address] Phone: [phone]   Fax:  [phone]  Name: [name] MRN: [id number] Date of Birth: 1956/04/06

## 2021-03-20 ENCOUNTER — Other Ambulatory Visit: Payer: Self-pay | Admitting: Family Medicine

## 2021-03-20 DIAGNOSIS — Z1231 Encounter for screening mammogram for malignant neoplasm of breast: Secondary | ICD-10-CM

## 2021-03-20 DIAGNOSIS — E2839 Other primary ovarian failure: Secondary | ICD-10-CM

## 2021-03-21 ENCOUNTER — Encounter (HOSPITAL_COMMUNITY): Payer: Medicare Other | Admitting: Physical Therapy

## 2021-03-23 ENCOUNTER — Encounter (HOSPITAL_COMMUNITY): Payer: Medicare Other | Admitting: Physical Therapy

## 2021-03-30 ENCOUNTER — Ambulatory Visit
Admission: EM | Admit: 2021-03-30 | Discharge: 2021-03-30 | Disposition: A | Discharge status: Home / Self Care | Payer: Medicare Other | Attending: Emergency Medicine | Admitting: Emergency Medicine

## 2021-03-30 ENCOUNTER — Other Ambulatory Visit: Payer: Self-pay

## 2021-03-30 ENCOUNTER — Encounter: Payer: Self-pay | Admitting: *Deleted

## 2021-03-30 ENCOUNTER — Ambulatory Visit (HOSPITAL_COMMUNITY): Payer: Medicare Other | Admitting: Physical Therapy

## 2021-03-30 DIAGNOSIS — L259 Unspecified contact dermatitis, unspecified cause: Secondary | ICD-10-CM

## 2021-03-30 DIAGNOSIS — R21 Rash and other nonspecific skin eruption: Secondary | ICD-10-CM

## 2021-03-30 MED ORDER — TRIAMCINOLONE ACETONIDE 0.1 % EX CREA: 1.0000 "application " | TOPICAL_CREAM | Freq: Two times a day (BID) | CUTANEOUS | 0 refills | Status: DC

## 2021-03-30 NOTE — ED Triage Notes (Signed)
Pt reports rash to Lt knee .

## 2021-03-30 NOTE — ED Provider Notes (Signed)
[facility]   [id number] 03/30/21 Arrival Time: 26  CC: rash   SUBJECTIVE:  [name] is a 65 y.o. female who presents with a skin complaint that began 1-2 weeks ago. Denies precipitating event, trauma.  Speculates an insect may have bit her.  Describes it as red and itchy.  Has tried OTC hydrocortisone without relief.  Symptoms are made worse with scratching.  Denies similar symptoms in the past.   Denies fever, chills, nausea, vomiting, SOB, chest pain, abdominal pain, changes in bowel or bladder function.    ROS: As per HPI.  All other pertinent ROS negative.     Past Medical History:  Diagnosis Date   Adenocarcinoma of right lung, stage 1 ([place]) 11/29/2016   Cancer (HCC)    Chronic pain    Depression    takes Lexapro daily   Diabetes mellitus    takes Metformin daily and has an insulin pump.Fasting blood sugar runs250   GERD (gastroesophageal reflux disease)    takes Pantoprazole daily   History of colon polyps    benign   Hyperlipidemia    takes Atorvastatin daily   Hypertension    takes Amlodipine and Lisinopril daily   Insomnia    takes Trazodone nightly   Nocturia    Thyroid disease    Urinary frequency    Urinary urgency    Past Surgical History:  Procedure Laterality Date   ABDOMINAL HYSTERECTOMY     ABDOMINAL SURGERY     BIOPSY  05/28/2019   Procedure: BIOPSY;  Surgeon: Danie Binder, MD;  [facility];  Service: Endoscopy;;  random colon/gastric/duodenum   COLONOSCOPY  2005 MAC   TICs, IH   COLONOSCOPY N/A 03/08/2014   Procedure: COLONOSCOPY;  Surgeon: Danie Binder, MD;  [facility];  Service: Endoscopy;  Laterality: N/A;  1015   COLONOSCOPY WITH PROPOFOL N/A 05/28/2019   Procedure: COLONOSCOPY WITH PROPOFOL;  Surgeon: Danie Binder, MD;  [facility];  Service: Endoscopy;  Laterality: N/A;  9:15am   CYSTOSTOMY  11/22/2011   Procedure: CYSTOSTOMY SUPRAPUBIC;  Surgeon: Marissa Nestle, MD;  [facility];  Service: Urology;  Laterality: N/A;   ESOPHAGOGASTRODUODENOSCOPY N/A 03/08/2014   Procedure: ESOPHAGOGASTRODUODENOSCOPY (EGD);  Surgeon: Danie Binder, MD;  [facility];  Service: Endoscopy;  Laterality: N/A;   ESOPHAGOGASTRODUODENOSCOPY     ESOPHAGOGASTRODUODENOSCOPY (EGD) WITH PROPOFOL N/A 05/28/2019   Procedure: ESOPHAGOGASTRODUODENOSCOPY (EGD) WITH PROPOFOL;  Surgeon: Danie Binder, MD;  [facility];  Service: Endoscopy;  Laterality: N/A;   GIVENS CAPSULE STUDY N/A 06/15/2019   Procedure: GIVENS CAPSULE STUDY;  Surgeon: Danie Binder, MD;  [facility];  Service: Endoscopy;  Laterality: N/A;  7:30am   HALLUX VALGUS CORRECTION     bilateral foot surgery for bone repairs-multiple   LOBECTOMY Right 10/15/2016   Procedure: RIGHT UPPER LOBECTOMY;  Surgeon: [name], MD;  Location: [place];  Service: Thoracic;  Laterality: Right;   LYMPH NODE DISSECTION Right 10/15/2016   Procedure: LYMPH NODE DISSECTION;  Surgeon: [name], MD;  Location: [place];  Service: Thoracic;  Laterality: Right;   POLYPECTOMY  05/28/2019   Procedure: POLYPECTOMY;  Surgeon: Danie Binder, MD;  [facility];  Service: Endoscopy;;   SAVORY DILATION N/A 05/28/2019   Procedure: SAVORY DILATION;  Surgeon: Danie Binder, MD;  [facility];  Service: Endoscopy;  Laterality: N/A;  15/16/17  VAGINA RECONSTRUCTION SURGERY     tvt 2006- [place] (VATS)/WEDGE RESECTION Right 10/15/2016   Procedure: RIGHT VIDEO ASSISTED THORACOSCOPY (VATS)/WEDGE RESECTION;  Surgeon: [name], MD;  Location: [place];  Service: Thoracic;  Laterality: Right;   Allergies  Allergen Reactions   Synthroid [Levothyroxine Sodium] Anaphylaxis   Hydrochlorothiazide     Other reaction(s): cramps   Iodine Rash    I get a skin rash sometimes if iodine applied to my skin.     Povidone-Iodine Itching and Rash   No  current facility-administered medications on file prior to encounter.   Current Outpatient Medications on File Prior to Encounter  Medication Sig Dispense Refill   ACCU-CHEK AVIVA PLUS test strip USE TO MONITOR GLUCOSE LEVELS TWICE A DAY E11.9 100 strip 12   ACCU-CHEK SOFTCLIX LANCETS lancets Use to monitor glucose levels BID; E11.9 (Patient taking differently: 1 each by Other route 2 (two) times daily. E11.9) 100 each 12   acetaminophen (TYLENOL) 500 MG tablet Take 1 tablet (500 mg total) by mouth every 6 (six) hours as needed. (Patient taking differently: Take 500 mg by mouth every 6 (six) hours as needed (for pain).) 30 tablet 0   atorvastatin (LIPITOR) 20 MG tablet Take 20 mg by mouth daily.  11   benzonatate (TESSALON) 100 MG capsule Take 1 capsule (100 mg total) by mouth every 8 (eight) hours. 30 capsule 0   Blood Glucose Calibration (GLUCOSE CONTROL) SOLN 1 Bottle by In Vitro route as needed. Use to calibrate Accu-Chek Aviva Plus device prn; please provide control solution compatible with Accu-Chek Aviva Plus device. (Patient taking differently: 1 each by Other route as needed (Use to calibrate glucometer). E11.9) 1 each 1   Blood Glucose Monitoring Suppl (ACCU-CHEK AVIVA PLUS) w/Device KIT 1 each by Does not apply route 2 (two) times daily. Use to monitor glucose levels BID; E11.9 (Patient taking differently: 1 each by Does not apply route 2 (two) times daily. E11.9) 1 kit 0   bromocriptine (PARLODEL) 2.5 MG tablet TAKE 1 TABLET BY MOUTH EVERY DAY 30 tablet 11   cetirizine (ZYRTEC ALLERGY) 10 MG tablet Take 1 tablet (10 mg total) by mouth daily. 30 tablet 0   chlorthalidone (HYGROTON) 25 MG tablet Take 12.5 mg by mouth daily.      escitalopram (LEXAPRO) 10 MG tablet Take 1 tablet (10 mg total) by mouth every morning. 90 tablet 3   FARXIGA 10 MG TABS tablet TAKE 1 TABLET BY MOUTH EVERY DAY 30 tablet 11   fluticasone (FLONASE) 50 MCG/ACT nasal spray Place 1 spray into both nostrils daily for 14  days. 16 g 0   insulin aspart (NOVOLOG) 100 UNIT/ML injection FOR USE IN PUMP, TOTAL OF  70 UNITS PER DAY 30 mL 8   Insulin Disposable Pump (V-GO 30) KIT 70 units daily as needed DX E11.9, Z79.4 30 kit 11   lisinopril (ZESTRIL) 10 MG tablet Take 10 mg by mouth daily.     metFORMIN (GLUCOPHAGE-XR) 500 MG 24 hr tablet TAKE 1 TABLET BY MOUTH 2 TIMES DAILY. 60 tablet 11   pantoprazole (PROTONIX) 40 MG tablet 1 PO 30 MINS BEFORE YOUR FIRST AND LAST MEAL. 60 tablet 5   promethazine (PHENERGAN) 12.5 MG tablet 1-2 po q4-6 h prn nausea or vomiting 30 tablet 1   traZODone (DESYREL) 100 MG tablet Take 2 tablets (200 mg total) by mouth at bedtime. 409 tablet 3   TRULICITY 1.5 WJ/1.9JY SOPN INJECT 1.5 MG  INTO THE SKIN ONCE A WEEK. 6 mL 3   YUVAFEM 10 MCG TABS vaginal tablet Place 10 mcg vaginally See admin instructions. Twice a week as needed for vaginal dryness  11   Social History   Socioeconomic History   Marital status: Single    Spouse name: Not on file   Number of children: Not on file   Years of education: Not on file   Highest education level: Not on file  Occupational History   Not on file  Tobacco Use   Smoking status: Former    Packs/day: 1.00    Years: 32.00    Pack years: 32.00    Types: Cigarettes   Smokeless tobacco: Never   Tobacco comments:    quit smoking 20 yrs ago  Vaping Use   Vaping Use: Never used  Substance and Sexual Activity   Alcohol use: No    Comment: rarely   Drug use: No   Sexual activity: Not on file  Other Topics Concern   Not on file  Social History Narrative   Not on file   Social Determinants of Health   Financial Resource Strain: Not on file  Food Insecurity: Not on file  Transportation Needs: Not on file  Physical Activity: Not on file  Stress: Not on file  Social Connections: Not on file  Intimate Partner Violence: Not on file   Family History  Problem Relation Age of Onset   Asthma Other    Diabetes Other    Arthritis Other    Cancer  Mother    Cancer Brother    Anesthesia problems Neg Hx    Hypotension Neg Hx    Malignant hyperthermia Neg Hx    Pseudochol deficiency Neg Hx    Colon polyps Neg Hx    Colon cancer Neg Hx    Celiac disease Neg Hx    Pancreatic cancer Neg Hx    Stomach cancer Neg Hx    Ulcerative colitis Neg Hx    Crohn's disease Neg Hx     OBJECTIVE: Vitals:   03/30/21 1346  BP: 127/77  Pulse: 76  Resp: 18  Temp: 98.5 F (36.9 C)  TempSrc: Oral  SpO2: 95%    General appearance: alert; no distress Head: NCAT Lungs: normal respiratory effort Extremities: no edema Skin: warm and dry; erythematous macular rash in confluent distribution to medial anterior LT knee, NTTP, no obvious drainage or bleeding, blanches with pressure apx 4 x 4 cm area Psychological: alert and cooperative; normal mood and affect  ASSESSMENT & PLAN:  1. Rash and nonspecific skin eruption   2. Contact dermatitis, unspecified contact dermatitis type, unspecified trigger     Meds ordered this encounter  Medications   triamcinolone cream (KENALOG) 0.1 %    Sig: Apply 1 application topically 2 (two) times daily.    Dispense:  30 g    Refill:  0    Order Specific Question:   Supervising Provider    Answer:   Raylene Everts [[id number]]    Wash with warm water and mild soap Triamcinolone cream prescribed Avoid hot showers/ baths Moisturize skin daily  Follow up with PCP if symptoms persists Return or go to the ER if you have any new or worsening symptoms such as fever, chills, nausea, vomiting, redness, swelling, discharge, if symptoms do not improve with medications, etc...  Reviewed expectations re: course of current medical issues. Questions answered. Outlined signs and symptoms indicating need for more acute intervention. Patient verbalized  understanding. After Visit Summary given.    Lestine Box, PA-C 03/30/21 1409

## 2021-03-30 NOTE — Discharge Instructions (Addendum)
Wash with warm water and mild soap Triamcinolone cream prescribed Avoid hot showers/ baths Moisturize skin daily  Follow up with PCP if symptoms persists Return or go to the ER if you have any new or worsening symptoms such as fever, chills, nausea, vomiting, redness, swelling, discharge, if symptoms do not improve with medications, etc..Marland Kitchen

## 2021-04-04 ENCOUNTER — Ambulatory Visit (HOSPITAL_COMMUNITY): Payer: Medicare Other | Attending: Orthopedic Surgery | Admitting: Physical Therapy

## 2021-04-04 DIAGNOSIS — M6281 Muscle weakness (generalized): Secondary | ICD-10-CM | POA: Insufficient documentation

## 2021-04-04 DIAGNOSIS — M533 Sacrococcygeal disorders, not elsewhere classified: Secondary | ICD-10-CM | POA: Insufficient documentation

## 2021-04-06 ENCOUNTER — Ambulatory Visit (HOSPITAL_COMMUNITY): Payer: Medicare Other | Admitting: Physical Therapy

## 2021-04-06 ENCOUNTER — Other Ambulatory Visit: Payer: Self-pay

## 2021-04-06 DIAGNOSIS — M533 Sacrococcygeal disorders, not elsewhere classified: Secondary | ICD-10-CM

## 2021-04-06 DIAGNOSIS — M6281 Muscle weakness (generalized): Secondary | ICD-10-CM | POA: Diagnosis not present

## 2021-04-06 NOTE — Therapy (Addendum)
[place], [place], [address] Phone: [phone]   Fax:  [phone]  Physical Therapy Treatment  Patient Details  Name: [name] MRN: [id number] Date of Birth: 01-Jun-1956 Referring Provider (PT): Arther Abbott MD  PHYSICAL THERAPY DISCHARGE SUMMARY  Visits from Start of Care: 7  Current functional level related to goals / functional outcomes: See below    Remaining deficits: See below   Education / Equipment: HEP   Patient agrees to discharge. Patient goals were met. Patient is being discharged due to being pleased with the current functional level.   Encounter Date: 04/06/2021   PT End of Session - 04/06/21 1614     Visit Number 7    Number of Visits 7    Date for PT Re-Evaluation 04/04/21    Authorization Type medicare primary and aetna NAP 80 VL    Progress Note Due on Visit 10    PT Start Time 7591    PT Stop Time 1608    PT Time Calculation (min) 38 min    Activity Tolerance Patient tolerated treatment well    Behavior During Therapy WFL for tasks assessed/performed             Past Medical History:  Diagnosis Date   Adenocarcinoma of right lung, stage 1 ([place]) 11/29/2016   Cancer (HCC)    Chronic pain    Depression    takes Lexapro daily   Diabetes mellitus    takes Metformin daily and has an insulin pump.Fasting blood sugar runs250   GERD (gastroesophageal reflux disease)    takes Pantoprazole daily   History of colon polyps    benign   Hyperlipidemia    takes Atorvastatin daily   Hypertension    takes Amlodipine and Lisinopril daily   Insomnia    takes Trazodone nightly   Nocturia    Thyroid disease    Urinary frequency    Urinary urgency     Past Surgical History:  Procedure Laterality Date   ABDOMINAL HYSTERECTOMY     ABDOMINAL SURGERY     BIOPSY  05/28/2019   Procedure: BIOPSY;  Surgeon: Danie Binder, MD;  [facility];  Service: Endoscopy;;   random colon/gastric/duodenum   COLONOSCOPY  2005 MAC   TICs, IH   COLONOSCOPY N/A 03/08/2014   Procedure: COLONOSCOPY;  Surgeon: Danie Binder, MD;  [facility];  Service: Endoscopy;  Laterality: N/A;  1015   COLONOSCOPY WITH PROPOFOL N/A 05/28/2019   Procedure: COLONOSCOPY WITH PROPOFOL;  Surgeon: Danie Binder, MD;  [facility];  Service: Endoscopy;  Laterality: N/A;  9:15am   CYSTOSTOMY  11/22/2011   Procedure: CYSTOSTOMY SUPRAPUBIC;  Surgeon: Marissa Nestle, MD;  [facility];  Service: Urology;  Laterality: N/A;   ESOPHAGOGASTRODUODENOSCOPY N/A 03/08/2014   Procedure: ESOPHAGOGASTRODUODENOSCOPY (EGD);  Surgeon: Danie Binder, MD;  [facility];  Service: Endoscopy;  Laterality: N/A;   ESOPHAGOGASTRODUODENOSCOPY     ESOPHAGOGASTRODUODENOSCOPY (EGD) WITH PROPOFOL N/A 05/28/2019   Procedure: ESOPHAGOGASTRODUODENOSCOPY (EGD) WITH PROPOFOL;  Surgeon: Danie Binder, MD;  [facility];  Service: Endoscopy;  Laterality: N/A;   GIVENS CAPSULE STUDY N/A 06/15/2019   Procedure: GIVENS CAPSULE STUDY;  Surgeon: Danie Binder, MD;  [facility];  Service: Endoscopy;  Laterality: N/A;  7:30am   HALLUX VALGUS CORRECTION     bilateral foot surgery for bone repairs-multiple   LOBECTOMY Right 10/15/2016  Procedure: RIGHT UPPER LOBECTOMY;  Surgeon: [name], MD;  Location: [place];  Service: Thoracic;  Laterality: Right;   LYMPH NODE DISSECTION Right 10/15/2016   Procedure: LYMPH NODE DISSECTION;  Surgeon: [name], MD;  Location: [place] [place];  Service: Thoracic;  Laterality: Right;   POLYPECTOMY  05/28/2019   Procedure: POLYPECTOMY;  Surgeon: Danie Binder, MD;  [facility];  Service: Endoscopy;;   SAVORY DILATION N/A 05/28/2019   Procedure: SAVORY DILATION;  Surgeon: Danie Binder, MD;  [facility];  Service: Endoscopy;  Laterality: N/A;  15/16/17   VAGINA RECONSTRUCTION SURGERY     tvt  2006- [place] (VATS)/WEDGE RESECTION Right 10/15/2016   Procedure: RIGHT VIDEO ASSISTED THORACOSCOPY (VATS)/WEDGE RESECTION;  Surgeon: [name], MD;  Location: [place];  Service: Thoracic;  Laterality: Right;    There were no vitals filed for this visit.       [facility] PT Assessment - 04/06/21 0001       Assessment   Medical Diagnosis LX/SIJ pain    Referring Provider (PT) Arther Abbott MD      Observation/Other Assessments   Focus on Therapeutic Outcomes (FOTO)  94% functional status was 69% functional status      AROM   Lumbar Flexion WNL was 50% limited    Lumbar Extension WNL was 100% limited    Lumbar - Right Side Bend WNL was 50% limited    Lumbar - Left Side Bend WNL was 50% limited      Strength   Right Hip Extension 5/5   was 5/5   Right Hip External Rotation  5/5   was 4+/5   Right Hip Internal Rotation 5/5   was 4-/5   Right Hip ABduction 4+/5   was 4+/5   Right Hip ADduction 5/5   was 4/5   Left Hip Extension 5/5   was 5/5   Left Hip External Rotation 5/5   was 4+/5   Left Hip Internal Rotation 5/5   was 4+/5   Left Hip ABduction 5/5   was 5/5   Left Hip ADduction 5/5   was 4/5   Right Knee Flexion 5/5   was 4+/5   Right Knee Extension 5/5   was 4+/5   Left Knee Flexion 5/5   was 5/5   Left Knee Extension 5/5   was 5/5                          [facility] Adult PT Treatment/Exercise - 04/06/21 0001       Exercises   Exercises Lumbar                       PT Short Term Goals - 04/06/21 1602       PT SHORT TERM GOAL #1   Title Patient will be able to demonstrate painfree lumbar ROM    Time 3    Period Weeks    Status Achieved    Target Date 03/14/21      PT SHORT TERM GOAL #2   Title Patient will report at least 50% improvement in overall symptoms and/or function to demonstrate improved functional mobility    Time 3    Period Weeks    Status Achieved    Target Date  03/14/21      PT SHORT TERM GOAL #3   Title Patient will be independent  in self management strategies to improve quality of life and functional outcomes.    Time 3    Period Weeks    Status Achieved    Target Date 03/14/21               PT Long Term Goals - 04/06/21 1602       PT LONG TERM GOAL #1   Title Patient will be able to sleep without waking up in pain to improve QOL    Time 6    Period Weeks    Status Achieved      PT LONG TERM GOAL #2   Title Patient will report at least 75% improvement in overall symptoms and/or function to demonstrate improved functional mobility    Time 6    Period Weeks    Status Achieved      PT LONG TERM GOAL #3   Title Patient will improve on FOTO score to meet predicted outcomes to demonstrate improved functional mobility.    Time 6    Period Weeks    Status Achieved                   Plan - 04/06/21 1615     Clinical Impression Statement Pt returns after 3 weeks leave from therapy and reports she is over 90% improved.  All mm strength and ROM is now WNL.  SI joint remains in alignment and pt is independent on self correction techniques.  Pt has met all short and long term goals and has no further skilled need at this time.    Personal Factors and Comorbidities Comorbidity 1    Examination-Activity Limitations Dressing;Transfers;Carry;Bend;Squat;Sleep    Examination-Participation Restrictions Meal Prep;Driving;Shop;Community Activity;Cleaning    Stability/Clinical Decision Making Stable/Uncomplicated    Rehab Potential Good    PT Frequency 2x / week    PT Duration 6 weeks    PT Treatment/Interventions ADLs/Self Care Home Management;Cryotherapy;Moist Heat;Electrical Stimulation;Traction;Therapeutic exercise;Therapeutic activities;Manual techniques;Neuromuscular re-education;Patient/family education;Dry needling;Joint Manipulations;Spinal Manipulations;Passive range of motion    PT Next Visit Plan Discharge to HEP/self  maintenance.             Patient will benefit from skilled therapeutic intervention in order to improve the following deficits and impairments:  Decreased strength, Decreased activity tolerance, Pain, Decreased range of motion  Visit Diagnosis: Muscle weakness (generalized)  Sacroiliac joint pain     Problem List Patient Active Problem List   Diagnosis Date Noted   Chronic pain 01/02/2021   Decreased estrogen level 01/02/2021   Exostosis of skull 01/02/2021   Insomnia 01/02/2021   Long term (current) use of insulin ([place]) 01/02/2021   Moderate recurrent major depression ([place]) 01/02/2021   Pure hypercholesterolemia 01/02/2021   Ventricular premature beats 01/02/2021   Nausea without vomiting 01/06/2020   Abdominal pain 01/06/2020   Obesity 07/15/2019   Diarrhea    History of colonic polyps 03/09/2019   Microcytic anemia 03/09/2019   Dysphagia 03/09/2019   Adenocarcinoma of right lung, stage 1 ([place]) 11/29/2016   S/P lobectomy of lung 10/15/2016   Hyperlipidemia 10/02/2016   Lung nodule 10/02/2016   Adrenal adenoma, right 10/02/2016   Diabetes ([place]) 06/16/2016   HTN (hypertension) 05/31/2016   Increased frequency of urination 07/16/2014   Change in bowel habits 03/03/2014   Depression 10/13/2013   Fractured lateral malleolus 08/12/2012   Left knee sprain 08/12/2012   Teena Irani, PTA/CLT [place], PT CLT [phone] 04/06/2021, 5:12 PM  [facility]  [address] [place], [place], [address] Phone: [phone]   Fax:  [phone]  Name: [name] MRN: [id number] Date of Birth: Aug 29, 1955

## 2021-04-21 ENCOUNTER — Telehealth (INDEPENDENT_AMBULATORY_CARE_PROVIDER_SITE_OTHER): Payer: Medicare Other | Admitting: Psychiatry

## 2021-04-21 ENCOUNTER — Encounter (HOSPITAL_COMMUNITY): Payer: Self-pay | Admitting: Psychiatry

## 2021-04-21 ENCOUNTER — Other Ambulatory Visit: Payer: Self-pay

## 2021-04-21 DIAGNOSIS — F331 Major depressive disorder, recurrent, moderate: Secondary | ICD-10-CM

## 2021-04-21 MED ORDER — TRAZODONE HCL 100 MG PO TABS
200.0000 mg | ORAL_TABLET | Freq: Every day | ORAL | 3 refills | Status: DC
Start: 2021-04-21 — End: 2021-09-14

## 2021-04-21 MED ORDER — ESCITALOPRAM OXALATE 10 MG PO TABS
10.0000 mg | ORAL_TABLET | Freq: Every morning | ORAL | 3 refills | Status: DC
Start: 2021-04-21 — End: 2021-09-14

## 2021-04-21 NOTE — Progress Notes (Signed)
Virtual Visit via Telephone Note  I connected with [name] on 04/21/21 at  9:00 AM EDT by telephone and verified that I am speaking with the correct person using two identifiers.  Location: Patient: home Provider: home office   I discussed the limitations, risks, security and privacy concerns of performing an evaluation and management service by telephone and the availability of in person appointments. I also discussed with the patient that there may be a patient responsible charge related to this service. The patient expressed understanding and agreed to proceed.     I discussed the assessment and treatment plan with the patient. The patient was provided an opportunity to ask questions and all were answered. The patient agreed with the plan and demonstrated an understanding of the instructions.   The patient was advised to call back or seek an in-person evaluation if the symptoms worsen or if the condition fails to improve as anticipated.  I provided 14 minutes of non-face-to-face time during this encounter.   Levonne Spiller, MD  [facility] MD/PA/NP OP Progress Note  04/21/2021 9:19 AM [name]  MRN:  [id number]  Chief Complaint:  Chief Complaint   Anxiety; Depression; Follow-up    HPI: this patient is a 65 year-old single white female who lives alone in [place]. She retired as a Recruitment consultant  She is originally from [place]   The patient returns for follow-up after 6 months regarding her PTSD and depression.  She states that she is doing very well.  She did have to have some physical therapy for SI joint pain but is doing well now.  She walks 4 miles a day.  Her mood has been good and she has been getting out and doing things.  She is sleeping well.  She denies significant anxiety depressed mood or thoughts of self-harm or suicide. Visit Diagnosis:    ICD-10-CM   1. Major depressive disorder, recurrent episode, moderate (HCC)  F33.1       Past  Psychiatric History: none  Past Medical History:  Past Medical History:  Diagnosis Date   Adenocarcinoma of right lung, stage 1 (HCC) 11/29/2016   Cancer (HCC)    Chronic pain    Depression    takes Lexapro daily   Diabetes mellitus    takes Metformin daily and has an insulin pump.Fasting blood sugar runs250   GERD (gastroesophageal reflux disease)    takes Pantoprazole daily   History of colon polyps    benign   Hyperlipidemia    takes Atorvastatin daily   Hypertension    takes Amlodipine and Lisinopril daily   Insomnia    takes Trazodone nightly   Nocturia    Thyroid disease    Urinary frequency    Urinary urgency     Past Surgical History:  Procedure Laterality Date   ABDOMINAL HYSTERECTOMY     ABDOMINAL SURGERY     BIOPSY  05/28/2019   Procedure: BIOPSY;  Surgeon: Danie Binder, MD;  [facility];  Service: Endoscopy;;  random colon/gastric/duodenum   COLONOSCOPY  2005 MAC   TICs, IH   COLONOSCOPY N/A 03/08/2014   Procedure: COLONOSCOPY;  Surgeon: Danie Binder, MD;  [facility];  Service: Endoscopy;  Laterality: N/A;  1015   COLONOSCOPY WITH PROPOFOL N/A 05/28/2019   Procedure: COLONOSCOPY WITH PROPOFOL;  Surgeon: Danie Binder, MD;  [facility];  Service: Endoscopy;  Laterality: N/A;  9:15am   CYSTOSTOMY  11/22/2011  Procedure: CYSTOSTOMY SUPRAPUBIC;  Surgeon: Marissa Nestle, MD;  [facility];  Service: Urology;  Laterality: N/A;   ESOPHAGOGASTRODUODENOSCOPY N/A 03/08/2014   Procedure: ESOPHAGOGASTRODUODENOSCOPY (EGD);  Surgeon: Danie Binder, MD;  [facility];  Service: Endoscopy;  Laterality: N/A;   ESOPHAGOGASTRODUODENOSCOPY     ESOPHAGOGASTRODUODENOSCOPY (EGD) WITH PROPOFOL N/A 05/28/2019   Procedure: ESOPHAGOGASTRODUODENOSCOPY (EGD) WITH PROPOFOL;  Surgeon: Danie Binder, MD;  [facility];  Service: Endoscopy;  Laterality: N/A;   GIVENS CAPSULE STUDY N/A 06/15/2019   Procedure: GIVENS  CAPSULE STUDY;  Surgeon: Danie Binder, MD;  [facility];  Service: Endoscopy;  Laterality: N/A;  7:30am   HALLUX VALGUS CORRECTION     bilateral foot surgery for bone repairs-multiple   LOBECTOMY Right 10/15/2016   Procedure: RIGHT UPPER LOBECTOMY;  Surgeon: [name], MD;  Location: [place];  Service: Thoracic;  Laterality: Right;   LYMPH NODE DISSECTION Right 10/15/2016   Procedure: LYMPH NODE DISSECTION;  Surgeon: [name], MD;  Location: [place];  Service: Thoracic;  Laterality: Right;   POLYPECTOMY  05/28/2019   Procedure: POLYPECTOMY;  Surgeon: Danie Binder, MD;  [facility];  Service: Endoscopy;;   SAVORY DILATION N/A 05/28/2019   Procedure: SAVORY DILATION;  Surgeon: Danie Binder, MD;  [facility];  Service: Endoscopy;  Laterality: N/A;  15/16/17   VAGINA RECONSTRUCTION SURGERY     tvt 2006- [place] (VATS)/WEDGE RESECTION Right 10/15/2016   Procedure: RIGHT VIDEO ASSISTED THORACOSCOPY (VATS)/WEDGE RESECTION;  Surgeon: [name], MD;  Location: [place];  Service: Thoracic;  Laterality: Right;    Family Psychiatric History: none diagnosed  Family History:  Family History  Problem Relation Age of Onset   Asthma Other    Diabetes Other    Arthritis Other    Cancer Mother    Cancer Brother    Anesthesia problems Neg Hx    Hypotension Neg Hx    Malignant hyperthermia Neg Hx    Pseudochol deficiency Neg Hx    Colon polyps Neg Hx    Colon cancer Neg Hx    Celiac disease Neg Hx    Pancreatic cancer Neg Hx    Stomach cancer Neg Hx    Ulcerative colitis Neg Hx    Crohn's disease Neg Hx     Social History:  Social History   Socioeconomic History   Marital status: Single    Spouse name: Not on file   Number of children: Not on file   Years of education: Not on file   Highest education level: Not on file  Occupational History   Not on file  Tobacco Use   Smoking  status: Former    Packs/day: 1.00    Years: 32.00    Pack years: 32.00    Types: Cigarettes   Smokeless tobacco: Never   Tobacco comments:    quit smoking 20 yrs ago  Vaping Use   Vaping Use: Never used  Substance and Sexual Activity   Alcohol use: No    Comment: rarely   Drug use: No   Sexual activity: Not on file  Other Topics Concern   Not on file  Social History Narrative   Not on file   Social Determinants of Health   Financial Resource Strain: Not on file  Food Insecurity: Not on file  Transportation Needs: Not on file  Physical Activity: Not on file  Stress:  Not on file  Social Connections: Not on file    Allergies:  Allergies  Allergen Reactions   Synthroid [Levothyroxine Sodium] Anaphylaxis   Hydrochlorothiazide     Other reaction(s): cramps   Iodine Rash    I get a skin rash sometimes if iodine applied to my skin.     Povidone-Iodine Itching and Rash    Metabolic Disorder Labs: Lab Results  Component Value Date   HGBA1C 6.9 (A) 02/23/2021   MPG 200 10/11/2016   No results found for: PROLACTIN No results found for: CHOL, TRIG, HDL, CHOLHDL, VLDL, LDLCALC Lab Results  Component Value Date   TSH 2.93 07/11/2016    Therapeutic Level Labs: No results found for: LITHIUM No results found for: VALPROATE No components found for:  CBMZ  Current Medications: Current Outpatient Medications  Medication Sig Dispense Refill   ACCU-CHEK AVIVA PLUS test strip USE TO MONITOR GLUCOSE LEVELS TWICE A DAY E11.9 100 strip 12   ACCU-CHEK SOFTCLIX LANCETS lancets Use to monitor glucose levels BID; E11.9 (Patient taking differently: 1 each by Other route 2 (two) times daily. E11.9) 100 each 12   acetaminophen (TYLENOL) 500 MG tablet Take 1 tablet (500 mg total) by mouth every 6 (six) hours as needed. (Patient taking differently: Take 500 mg by mouth every 6 (six) hours as needed (for pain).) 30 tablet 0   atorvastatin (LIPITOR) 20 MG tablet Take 20 mg by mouth daily.   11   benzonatate (TESSALON) 100 MG capsule Take 1 capsule (100 mg total) by mouth every 8 (eight) hours. 30 capsule 0   Blood Glucose Calibration (GLUCOSE CONTROL) SOLN 1 Bottle by In Vitro route as needed. Use to calibrate Accu-Chek Aviva Plus device prn; please provide control solution compatible with Accu-Chek Aviva Plus device. (Patient taking differently: 1 each by Other route as needed (Use to calibrate glucometer). E11.9) 1 each 1   Blood Glucose Monitoring Suppl (ACCU-CHEK AVIVA PLUS) w/Device KIT 1 each by Does not apply route 2 (two) times daily. Use to monitor glucose levels BID; E11.9 (Patient taking differently: 1 each by Does not apply route 2 (two) times daily. E11.9) 1 kit 0   bromocriptine (PARLODEL) 2.5 MG tablet TAKE 1 TABLET BY MOUTH EVERY DAY 30 tablet 11   cetirizine (ZYRTEC ALLERGY) 10 MG tablet Take 1 tablet (10 mg total) by mouth daily. 30 tablet 0   chlorthalidone (HYGROTON) 25 MG tablet Take 12.5 mg by mouth daily.      escitalopram (LEXAPRO) 10 MG tablet Take 1 tablet (10 mg total) by mouth every morning. 90 tablet 3   FARXIGA 10 MG TABS tablet TAKE 1 TABLET BY MOUTH EVERY DAY 30 tablet 11   fluticasone (FLONASE) 50 MCG/ACT nasal spray Place 1 spray into both nostrils daily for 14 days. 16 g 0   insulin aspart (NOVOLOG) 100 UNIT/ML injection FOR USE IN PUMP, TOTAL OF  70 UNITS PER DAY 30 mL 8   Insulin Disposable Pump (V-GO 30) KIT 70 units daily as needed DX E11.9, Z79.4 30 kit 11   lisinopril (ZESTRIL) 10 MG tablet Take 10 mg by mouth daily.     metFORMIN (GLUCOPHAGE-XR) 500 MG 24 hr tablet TAKE 1 TABLET BY MOUTH 2 TIMES DAILY. 60 tablet 11   pantoprazole (PROTONIX) 40 MG tablet 1 PO 30 MINS BEFORE YOUR FIRST AND LAST MEAL. 60 tablet 5   promethazine (PHENERGAN) 12.5 MG tablet 1-2 po q4-6 h prn nausea or vomiting 30 tablet 1   traZODone (DESYREL) 100  MG tablet Take 2 tablets (200 mg total) by mouth at bedtime. 180 tablet 3   triamcinolone cream (KENALOG) 0.1 % Apply 1  application topically 2 (two) times daily. 30 g 0   TRULICITY 1.5 XL/2.4MW SOPN INJECT 1.5 MG INTO THE SKIN ONCE A WEEK. 6 mL 3   YUVAFEM 10 MCG TABS vaginal tablet Place 10 mcg vaginally See admin instructions. Twice a week as needed for vaginal dryness  11   No current facility-administered medications for this visit.     Musculoskeletal: Strength & Muscle Tone: na Gait & Station: na Patient leans: N/A  Psychiatric Specialty Exam: Review of Systems  Musculoskeletal:  Positive for arthralgias.  All other systems reviewed and are negative.  There were no vitals taken for this visit.There is no height or weight on file to calculate BMI.  General Appearance: NA  Eye Contact:  NA  Speech:  Clear and Coherent  Volume:  Normal  Mood:  Euthymic  Affect:  NA  Thought Process:  Goal Directed  Orientation:  Full (Time, Place, and Person)  Thought Content: WDL   Suicidal Thoughts:  No  Homicidal Thoughts:  No  Memory:  Immediate;   Good Recent;   Good Remote;   Good  Judgement:  Good  Insight:  Good  Psychomotor Activity:  Normal  Concentration:  Concentration: Good and Attention Span: Good  Recall:  Good  Fund of Knowledge: Good  Language: Good  Akathisia:  No  Handed:  Right  AIMS (if indicated): not done  Assets:  Communication Skills Desire for Improvement Physical Health Resilience Social Support Talents/Skills  ADL's:  Intact  Cognition: WNL  Sleep:  Good   Screenings: PHQ2-9    Flowsheet Row Video Visit from 04/21/2021 in [facility] Video Visit from 10/05/2020 in [place] [facility]  PHQ-2 Total Score 0 0      Flowsheet Row Video Visit from 04/21/2021 in [facility] from 03/30/2021 in [facility] from 12/28/2020 in [facility] [place] No Risk No Risk No Risk         Assessment and Plan: This patient is a 65 year old female with a history of depression and anxiety.  She is doing very well on her current regimen.  She will continue Lexapro 10 mg daily for depression and trazodone 200 mg at bedtime for sleep.  She will return to see me in 6 months   Levonne Spiller, MD 04/21/2021, 9:19 AM

## 2021-04-27 ENCOUNTER — Ambulatory Visit
Admission: RE | Admit: 2021-04-27 | Discharge: 2021-04-27 | Disposition: A | Discharge status: Home / Self Care | Payer: Medicare Other | Source: Ambulatory Visit | Attending: Family Medicine | Admitting: Family Medicine

## 2021-04-27 ENCOUNTER — Other Ambulatory Visit: Payer: Self-pay

## 2021-04-27 DIAGNOSIS — Z1231 Encounter for screening mammogram for malignant neoplasm of breast: Secondary | ICD-10-CM

## 2021-04-29 ENCOUNTER — Telehealth: Payer: Self-pay | Admitting: Endocrinology

## 2021-05-05 ENCOUNTER — Other Ambulatory Visit: Payer: Self-pay | Admitting: Family Medicine

## 2021-05-05 DIAGNOSIS — R928 Other abnormal and inconclusive findings on diagnostic imaging of breast: Secondary | ICD-10-CM

## 2021-05-12 ENCOUNTER — Other Ambulatory Visit: Payer: Self-pay

## 2021-05-12 DIAGNOSIS — E119 Type 2 diabetes mellitus without complications: Secondary | ICD-10-CM

## 2021-05-12 MED ORDER — V-GO 30 KIT: PACK | 11 refills | Status: DC

## 2021-05-12 NOTE — Telephone Encounter (Signed)
Pt calling requesting refill of Insulin Disposable Pump (V-GO 30) KIT    [facility] is waiting for diagnostics [facility]ill. [id number] [place] [address] [address] [id number] Phone:  [phone]   Pt has been trying to get her medication for a week and has none left. Pt contact [phone]

## 2021-05-12 NOTE — Telephone Encounter (Signed)
Message sent thru MyChart 

## 2021-05-13 ENCOUNTER — Ambulatory Visit
Admission: RE | Admit: 2021-05-13 | Discharge: 2021-05-13 | Disposition: A | Discharge status: Home / Self Care | Payer: Medicare Other | Source: Ambulatory Visit | Attending: Family Medicine | Admitting: Family Medicine

## 2021-05-13 ENCOUNTER — Ambulatory Visit: Payer: Medicare Other

## 2021-05-13 DIAGNOSIS — R928 Other abnormal and inconclusive findings on diagnostic imaging of breast: Secondary | ICD-10-CM

## 2021-05-27 ENCOUNTER — Other Ambulatory Visit: Payer: Self-pay | Admitting: Endocrinology

## 2021-06-06 ENCOUNTER — Telehealth: Payer: Self-pay | Admitting: Medical Oncology

## 2021-06-06 NOTE — Telephone Encounter (Signed)
ILVM for pt to return my call and resolve if she is allergic to providone -iodine ( Betadine) or IV contrast listed under her allergies.  If she is allergic to it then she needs premed for CT scan w/contrast.

## 2021-06-08 ENCOUNTER — Other Ambulatory Visit: Payer: Self-pay

## 2021-06-08 ENCOUNTER — Ambulatory Visit (HOSPITAL_COMMUNITY)
Admission: RE | Admit: 2021-06-08 | Discharge: 2021-06-08 | Disposition: A | Discharge status: Home / Self Care | Payer: Medicare Other | Source: Ambulatory Visit | Attending: Internal Medicine | Admitting: Internal Medicine

## 2021-06-08 DIAGNOSIS — C3490 Malignant neoplasm of unspecified part of unspecified bronchus or lung: Secondary | ICD-10-CM | POA: Diagnosis present

## 2021-06-08 LAB — POCT I-STAT CREATININE: Creatinine, Ser: 0.7 mg/dL (ref 0.44–1.00)

## 2021-06-08 MED ORDER — IOHEXOL 350 MG/ML SOLN
60.0000 mL | Freq: Once | INTRAVENOUS | Status: AC | PRN
  Administered 2021-06-08: 60 mL via INTRAVENOUS

## 2021-06-09 ENCOUNTER — Inpatient Hospital Stay: Payer: Medicare Other | Attending: Internal Medicine

## 2021-06-09 DIAGNOSIS — C3411 Malignant neoplasm of upper lobe, right bronchus or lung: Secondary | ICD-10-CM | POA: Diagnosis present

## 2021-06-09 DIAGNOSIS — E119 Type 2 diabetes mellitus without complications: Secondary | ICD-10-CM | POA: Insufficient documentation

## 2021-06-09 DIAGNOSIS — C3490 Malignant neoplasm of unspecified part of unspecified bronchus or lung: Secondary | ICD-10-CM

## 2021-06-09 DIAGNOSIS — I10 Essential (primary) hypertension: Secondary | ICD-10-CM | POA: Diagnosis not present

## 2021-06-09 LAB — CBC WITH DIFFERENTIAL (CANCER CENTER ONLY)
Abs Immature Granulocytes: 0.02 10*3/uL (ref 0.00–0.07)
Basophils Absolute: 0.1 10*3/uL (ref 0.0–0.1)
Basophils Relative: 1 %
Eosinophils Absolute: 0.4 10*3/uL (ref 0.0–0.5)
Eosinophils Relative: 4 %
HCT: 44 % (ref 36.0–46.0)
Hemoglobin: 14.1 g/dL (ref 12.0–15.0)
Immature Granulocytes: 0 %
Lymphocytes Relative: 20 %
Lymphs Abs: 2 10*3/uL (ref 0.7–4.0)
MCH: 26 pg (ref 26.0–34.0)
MCHC: 32 g/dL (ref 30.0–36.0)
MCV: 81 fL (ref 80.0–100.0)
Monocytes Absolute: 0.4 10*3/uL (ref 0.1–1.0)
Monocytes Relative: 4 %
Neutro Abs: 6.9 10*3/uL (ref 1.7–7.7)
Neutrophils Relative %: 71 %
Platelet Count: 259 10*3/uL (ref 150–400)
RBC: 5.43 MIL/uL — ABNORMAL HIGH (ref 3.87–5.11)
RDW: 14.5 % (ref 11.5–15.5)
WBC Count: 9.8 10*3/uL (ref 4.0–10.5)
nRBC: 0 % (ref 0.0–0.2)

## 2021-06-09 LAB — CMP (CANCER CENTER ONLY)
ALT: 18 U/L (ref 0–44)
AST: 16 U/L (ref 15–41)
Albumin: 3.9 g/dL (ref 3.5–5.0)
Alkaline Phosphatase: 78 U/L (ref 38–126)
Anion gap: 10 (ref 5–15)
BUN: 16 mg/dL (ref 8–23)
CO2: 27 mmol/L (ref 22–32)
Calcium: 9.2 mg/dL (ref 8.9–10.3)
Chloride: 105 mmol/L (ref 98–111)
Creatinine: 0.81 mg/dL (ref 0.44–1.00)
GFR, Estimated: >60 mL/min (ref >60)
Glucose, Bld: 134 mg/dL — ABNORMAL HIGH (ref 70–99)
Potassium: 4.5 mmol/L (ref 3.5–5.1)
Sodium: 142 mmol/L (ref 135–145)
Total Bilirubin: 0.5 mg/dL (ref 0.3–1.2)
Total Protein: 6.8 g/dL (ref 6.5–8.1)

## 2021-06-12 ENCOUNTER — Ambulatory Visit: Payer: Medicare Other | Admitting: Internal Medicine

## 2021-06-14 ENCOUNTER — Inpatient Hospital Stay (HOSPITAL_BASED_OUTPATIENT_CLINIC_OR_DEPARTMENT_OTHER): Payer: Medicare Other | Admitting: Internal Medicine

## 2021-06-14 ENCOUNTER — Other Ambulatory Visit: Payer: Self-pay

## 2021-06-14 ENCOUNTER — Encounter: Payer: Self-pay | Admitting: Internal Medicine

## 2021-06-14 VITALS — BP 148/74 | HR 83 | Temp 97.9°F | Resp 20 | Ht 67.0 in | Wt 238.1 lb

## 2021-06-14 DIAGNOSIS — C3491 Malignant neoplasm of unspecified part of right bronchus or lung: Secondary | ICD-10-CM | POA: Diagnosis not present

## 2021-06-14 DIAGNOSIS — C3490 Malignant neoplasm of unspecified part of unspecified bronchus or lung: Secondary | ICD-10-CM | POA: Diagnosis not present

## 2021-06-14 DIAGNOSIS — C3411 Malignant neoplasm of upper lobe, right bronchus or lung: Secondary | ICD-10-CM | POA: Diagnosis not present

## 2021-06-14 NOTE — Progress Notes (Signed)
[place] [phone]   [phone]  OFFICE PROGRESS NOTE  Koirala, Dibas, MD [address] [place] [address]  DIAGNOSIS: Stage IA (T1a, N0, M0) non-small cell lung cancer, adenocarcinoma presented with right upper lobe pulmonary nodule   PRIOR THERAPY: S/P right VATS with right upper lobectomy and mediastinal lymph node dissection under the care of Dr. Roxan Hockey on October 15, 2016.  CURRENT THERAPY: Observation.  INTERVAL HISTORY: [name] 65 y.o. female returns to the clinic today for annual follow-up visit.  The patient is feeling fine today with no concerning complaints.  She has no current chest pain, shortness of breath, cough or hemoptysis.  She has no nausea, vomiting, diarrhea or constipation.  She has no significant weight loss or night sweats.  She has no headache or visual changes.  She is here today for evaluation with repeat CT scan of the chest for restaging of her disease.  MEDICAL HISTORY: Past Medical History:  Diagnosis Date   Adenocarcinoma of right lung, stage 1 ([place]) 11/29/2016   Cancer (HCC)    Chronic pain    Depression    takes Lexapro daily   Diabetes mellitus    takes Metformin daily and has an insulin pump.Fasting blood sugar runs250   GERD (gastroesophageal reflux disease)    takes Pantoprazole daily   History of colon polyps    benign   Hyperlipidemia    takes Atorvastatin daily   Hypertension    takes Amlodipine and Lisinopril daily   Insomnia    takes Trazodone nightly   Nocturia    Thyroid disease    Urinary frequency    Urinary urgency     ALLERGIES:  is allergic to synthroid [levothyroxine sodium], hydrochlorothiazide, iodine, and povidone-iodine.  MEDICATIONS:  Current Outpatient Medications  Medication Sig Dispense Refill   ACCU-CHEK AVIVA PLUS test strip USE TO MONITOR GLUCOSE LEVELS TWICE A DAY E11.9 100 strip 12   ACCU-CHEK SOFTCLIX LANCETS lancets Use to  monitor glucose levels BID; E11.9 (Patient taking differently: 1 each by Other route 2 (two) times daily. E11.9) 100 each 12   acetaminophen (TYLENOL) 500 MG tablet Take 1 tablet (500 mg total) by mouth every 6 (six) hours as needed. (Patient taking differently: Take 500 mg by mouth every 6 (six) hours as needed (for pain).) 30 tablet 0   atorvastatin (LIPITOR) 20 MG tablet Take 20 mg by mouth daily.  11   benzonatate (TESSALON) 100 MG capsule Take 1 capsule (100 mg total) by mouth every 8 (eight) hours. 30 capsule 0   Blood Glucose Calibration (GLUCOSE CONTROL) SOLN 1 Bottle by In Vitro route as needed. Use to calibrate Accu-Chek Aviva Plus device prn; please provide control solution compatible with Accu-Chek Aviva Plus device. (Patient taking differently: 1 each by Other route as needed (Use to calibrate glucometer). E11.9) 1 each 1   Blood Glucose Monitoring Suppl (ACCU-CHEK AVIVA PLUS) w/Device KIT 1 each by Does not apply route 2 (two) times daily. Use to monitor glucose levels BID; E11.9 (Patient taking differently: 1 each by Does not apply route 2 (two) times daily. E11.9) 1 kit 0   bromocriptine (PARLODEL) 2.5 MG tablet TAKE 1 TABLET BY MOUTH EVERY DAY 30 tablet 11   cetirizine (ZYRTEC ALLERGY) 10 MG tablet Take 1 tablet (10 mg total) by mouth daily. 30 tablet 0   chlorthalidone (HYGROTON) 25 MG tablet Take 12.5 mg by mouth daily.  escitalopram (LEXAPRO) 10 MG tablet Take 1 tablet (10 mg total) by mouth every morning. 90 tablet 3   FARXIGA 10 MG TABS tablet TAKE 1 TABLET BY MOUTH EVERY DAY 30 tablet 11   insulin aspart (NOVOLOG) 100 UNIT/ML injection FOR USE IN PUMP, TOTAL OF  70 UNITS PER DAY 30 mL 8   Insulin Disposable Pump (V-GO 30) KIT 70 UNITS DAILY AS NEEDED DX E11.9, Z79.4 (NEITHER MEDICARE B NOR AETNA COVER THIS) 1 kit 11   lisinopril (ZESTRIL) 10 MG tablet Take 10 mg by mouth daily.     metFORMIN (GLUCOPHAGE-XR) 500 MG 24 hr tablet TAKE 1 TABLET BY MOUTH 2 TIMES DAILY. 60 tablet  11   pantoprazole (PROTONIX) 40 MG tablet 1 PO 30 MINS BEFORE YOUR FIRST AND LAST MEAL. 60 tablet 5   promethazine (PHENERGAN) 12.5 MG tablet 1-2 po q4-6 h prn nausea or vomiting 30 tablet 1   traZODone (DESYREL) 100 MG tablet Take 2 tablets (200 mg total) by mouth at bedtime. 180 tablet 3   triamcinolone cream (KENALOG) 0.1 % Apply 1 application topically 2 (two) times daily. 30 g 0   TRULICITY 1.5 DZ/3.2DJ SOPN INJECT 1.5 MG INTO THE SKIN ONCE A WEEK. 6 mL 3   YUVAFEM 10 MCG TABS vaginal tablet Place 10 mcg vaginally See admin instructions. Twice a week as needed for vaginal dryness  11   fluticasone (FLONASE) 50 MCG/ACT nasal spray Place 1 spray into both nostrils daily for 14 days. 16 g 0   No current facility-administered medications for this visit.    SURGICAL HISTORY:  Past Surgical History:  Procedure Laterality Date   ABDOMINAL HYSTERECTOMY     ABDOMINAL SURGERY     BIOPSY  05/28/2019   Procedure: BIOPSY;  Surgeon: Danie Binder, MD;  [facility];  Service: Endoscopy;;  random colon/gastric/duodenum   COLONOSCOPY  2005 MAC   TICs, IH   COLONOSCOPY N/A 03/08/2014   Procedure: COLONOSCOPY;  Surgeon: Danie Binder, MD;  [facility];  Service: Endoscopy;  Laterality: N/A;  1015   COLONOSCOPY WITH PROPOFOL N/A 05/28/2019   Procedure: COLONOSCOPY WITH PROPOFOL;  Surgeon: Danie Binder, MD;  [facility];  Service: Endoscopy;  Laterality: N/A;  9:15am   CYSTOSTOMY  11/22/2011   Procedure: CYSTOSTOMY SUPRAPUBIC;  Surgeon: Marissa Nestle, MD;  [facility];  Service: Urology;  Laterality: N/A;   ESOPHAGOGASTRODUODENOSCOPY N/A 03/08/2014   Procedure: ESOPHAGOGASTRODUODENOSCOPY (EGD);  Surgeon: Danie Binder, MD;  [facility];  Service: Endoscopy;  Laterality: N/A;   ESOPHAGOGASTRODUODENOSCOPY     ESOPHAGOGASTRODUODENOSCOPY (EGD) WITH PROPOFOL N/A 05/28/2019   Procedure: ESOPHAGOGASTRODUODENOSCOPY (EGD) WITH PROPOFOL;  Surgeon:  Danie Binder, MD;  [facility];  Service: Endoscopy;  Laterality: N/A;   GIVENS CAPSULE STUDY N/A 06/15/2019   Procedure: GIVENS CAPSULE STUDY;  Surgeon: Danie Binder, MD;  [facility];  Service: Endoscopy;  Laterality: N/A;  7:30am   HALLUX VALGUS CORRECTION     bilateral foot surgery for bone repairs-multiple   LOBECTOMY Right 10/15/2016   Procedure: RIGHT UPPER LOBECTOMY;  Surgeon: [name], MD;  Location: [facility];  Service: Thoracic;  Laterality: Right;   LYMPH NODE DISSECTION Right 10/15/2016   Procedure: LYMPH NODE DISSECTION;  Surgeon: [name], MD;  Location: [facility];  Service: Thoracic;  Laterality: Right;   POLYPECTOMY  05/28/2019   Procedure: POLYPECTOMY;  Surgeon: Danie Binder, MD;  [facility];  Service: Endoscopy;;   SAVORY DILATION N/A 05/28/2019   Procedure: SAVORY DILATION;  Surgeon: Danie Binder, MD;  [facility];  Service: Endoscopy;  Laterality: N/A;  15/16/17   VAGINA RECONSTRUCTION SURGERY     tvt 2006- [place] (VATS)/WEDGE RESECTION Right 10/15/2016   Procedure: RIGHT VIDEO ASSISTED THORACOSCOPY (VATS)/WEDGE RESECTION;  Surgeon: [name], MD;  Location: [facility];  Service: Thoracic;  Laterality: Right;    REVIEW OF SYSTEMS:  A comprehensive review of systems was negative.   PHYSICAL EXAMINATION: General appearance: alert, cooperative, and no distress Head: Normocephalic, without obvious abnormality, atraumatic Neck: no adenopathy, no JVD, supple, symmetrical, trachea midline, and thyroid not enlarged, symmetric, no tenderness/mass/nodules Lymph nodes: Cervical, supraclavicular, and axillary nodes normal. Resp: clear to auscultation bilaterally Back: symmetric, no curvature. ROM normal. No CVA tenderness. Cardio: regular rate and rhythm, S1, S2 normal, no murmur, click, rub or gallop GI: soft, non-tender; bowel sounds normal; no masses,  no  organomegaly Extremities: extremities normal, atraumatic, no cyanosis or edema  ECOG PERFORMANCE STATUS: 0 - Asymptomatic  Blood pressure (!) 148/74, pulse 83, temperature 97.9 F (36.6 C), temperature source Tympanic, resp. rate 20, height _0  (1.702 m), weight 238 lb 1.6 oz (108 kg), SpO2 98 %.  LABORATORY DATA: Lab Results  Component Value Date   WBC 9.8 06/09/2021   HGB 14.1 06/09/2021   HCT 44.0 06/09/2021   MCV 81.0 06/09/2021   PLT 259 06/09/2021      Chemistry      Component Value Date/Time   NA 142 06/09/2021 0844   NA 141 05/29/2017 0921   K 4.5 06/09/2021 0844   K 4.8 05/29/2017 0921   CL 105 06/09/2021 0844   CO2 27 06/09/2021 0844   CO2 28 05/29/2017 0921   BUN 16 06/09/2021 0844   BUN 13.5 05/29/2017 0921   CREATININE 0.81 06/09/2021 0844   CREATININE 0.8 05/29/2017 0921      Component Value Date/Time   CALCIUM 9.2 06/09/2021 0844   CALCIUM 9.5 05/29/2017 0921   ALKPHOS 78 06/09/2021 0844   ALKPHOS 104 05/29/2017 0921   AST 16 06/09/2021 0844   AST 17 05/29/2017 0921   ALT 18 06/09/2021 0844   ALT 20 05/29/2017 0921   BILITOT 0.5 06/09/2021 0844   BILITOT 0.40 05/29/2017 0921       RADIOGRAPHIC STUDIES: CT Chest W Contrast  Result Date: 06/09/2021 CLINICAL DATA:  Non-small cell lung cancer staging EXAM: CT CHEST WITH CONTRAST TECHNIQUE: Multidetector CT imaging of the chest was performed during intravenous contrast administration. CONTRAST:  40m OMNIPAQUE IOHEXOL 350 MG/ML SOLN COMPARISON:  06/10/2020 FINDINGS: Cardiovascular: Aortic atherosclerosis. Normal heart size. No pericardial effusion. Mediastinum/Nodes: No enlarged mediastinal, hilar, or axillary lymph nodes. Thyroid gland, trachea, and esophagus demonstrate no significant findings. Lungs/Pleura: Status post right upper lobectomy. No pleural effusion or pneumothorax. Upper Abdomen: No acute abnormality. Stable, benign bilateral adrenal adenomata. Musculoskeletal: No chest wall mass or  suspicious bone lesions identified. IMPRESSION: Status post right upper lobectomy. No evidence of recurrent or metastatic disease in the chest. Aortic Atherosclerosis (ICD10-I70.0). Electronically Signed   By: ADelanna AhmadiM.D.   On: 06/09/2021 14:16     ASSESSMENT AND PLAN: This is a very pleasant 65years old white female with stage IA non-small cell lung cancer status post right upper lobectomy with lymph node dissection.  This was diagnosed in March 2018. The patient is currently on observation  and she is feeling fine today with no concerning complaints. She had repeat CT scan of the chest performed recently.  I personally and independently reviewed the scan and discussed the results with the patient today. Her scan showed no concerning findings for disease recurrence or metastasis. I recommended for her to continue on observation with repeat CT scan of the chest in 1 year. For the hypertension, she will continue with her current blood pressure medications and monitor it closely at home. The patient was advised to call immediately if she has any other concerning symptoms in the interval.  The patient voices understanding of current disease status and treatment options and is in agreement with the current care plan. All questions were answered. The patient knows to call the clinic with any problems, questions or concerns. We can certainly see the patient much sooner if necessary.  Disclaimer: This note was dictated with voice recognition software. Similar sounding words can inadvertently be transcribed and may not be corrected upon review.

## 2021-06-26 ENCOUNTER — Ambulatory Visit (INDEPENDENT_AMBULATORY_CARE_PROVIDER_SITE_OTHER): Payer: Medicare Other | Admitting: Endocrinology

## 2021-06-26 ENCOUNTER — Other Ambulatory Visit: Payer: Self-pay

## 2021-06-26 VITALS — BP 100/50 | HR 96 | Ht 67.0 in | Wt 235.0 lb

## 2021-06-26 DIAGNOSIS — E119 Type 2 diabetes mellitus without complications: Secondary | ICD-10-CM

## 2021-06-26 DIAGNOSIS — Z794 Long term (current) use of insulin: Secondary | ICD-10-CM

## 2021-06-26 LAB — POCT GLYCOSYLATED HEMOGLOBIN (HGB A1C): Hemoglobin A1C: 7.1 % — AB (ref 4.0–5.6)

## 2021-06-26 MED ORDER — TIRZEPATIDE 5 MG/0.5ML ~~LOC~~ SOAJ: 5.0000 mg | SUBCUTANEOUS | 3 refills | Status: DC

## 2021-06-26 NOTE — Patient Instructions (Addendum)
I have sent a prescription to your pharmacy, to change the Trulicity to [facility].   Please continue the same insulin and other 3 diabetes medications.   check your blood sugar twice a day.  vary the time of day when you check, between before the 3 meals, and at bedtime.  also check if you have symptoms of your blood sugar being too high or too low.  please keep a record of the readings and bring it to your next appointment here (or you can bring the meter itself).  You can write it on any piece of paper.  please call us sooner if your blood sugar goes below 70, or if you have a lot of readings over 200.   Please come back for a follow-up appointment in 3 months.

## 2021-06-26 NOTE — Progress Notes (Signed)
Subjective:    Patient ID: [name], female    DOB: 1956-02-15, 65 y.o.   MRN: [id number]  HPI Pt returns for f/u of diabetes mellitus: DM type: Insulin-requiring type 2.   Dx'ed: 2952 Complications: none Therapy: insulin since soon after dx (now V-GO-30), 3 oral meds, and Trulicity.   GDM: never (G0) DKA: never Severe hypoglycemia: never.   Pancreatitis: never.  Other: she stopped victoza, due to lack of effect; med dosages (esp Trulicity) are limited by nausea.   Interval history: She takes approx 84-13 clicks per day, via the V-GO-30.  she brings her meter with her cbg's which I have reviewed today.  cbg varies from 89-305.  There is no trend throughout the day.  She feels as thought the Trulicity no longer works. Past Medical History:  Diagnosis Date   Adenocarcinoma of right lung, stage 1 ([place]) 11/29/2016   Cancer (HCC)    Chronic pain    Depression    takes Lexapro daily   Diabetes mellitus    takes Metformin daily and has an insulin pump.Fasting blood sugar runs250   GERD (gastroesophageal reflux disease)    takes Pantoprazole daily   History of colon polyps    benign   Hyperlipidemia    takes Atorvastatin daily   Hypertension    takes Amlodipine and Lisinopril daily   Insomnia    takes Trazodone nightly   Nocturia    Thyroid disease    Urinary frequency    Urinary urgency     Past Surgical History:  Procedure Laterality Date   ABDOMINAL HYSTERECTOMY     ABDOMINAL SURGERY     BIOPSY  05/28/2019   Procedure: BIOPSY;  Surgeon: Danie Binder, MD;  [facility];  Service: Endoscopy;;  random colon/gastric/duodenum   COLONOSCOPY  2005 MAC   TICs, IH   COLONOSCOPY N/A 03/08/2014   Procedure: COLONOSCOPY;  Surgeon: Danie Binder, MD;  [facility];  Service: Endoscopy;  Laterality: N/A;  1015   COLONOSCOPY WITH PROPOFOL N/A 05/28/2019   Procedure: COLONOSCOPY WITH PROPOFOL;  Surgeon: Danie Binder, MD;  [facility];   Service: Endoscopy;  Laterality: N/A;  9:15am   CYSTOSTOMY  11/22/2011   Procedure: CYSTOSTOMY SUPRAPUBIC;  Surgeon: Marissa Nestle, MD;  Location: [facility];  Service: Urology;  Laterality: N/A;   ESOPHAGOGASTRODUODENOSCOPY N/A 03/08/2014   Procedure: ESOPHAGOGASTRODUODENOSCOPY (EGD);  Surgeon: Danie Binder, MD;  [facility];  Service: Endoscopy;  Laterality: N/A;   ESOPHAGOGASTRODUODENOSCOPY     ESOPHAGOGASTRODUODENOSCOPY (EGD) WITH PROPOFOL N/A 05/28/2019   Procedure: ESOPHAGOGASTRODUODENOSCOPY (EGD) WITH PROPOFOL;  Surgeon: Danie Binder, MD;  [facility];  Service: Endoscopy;  Laterality: N/A;   GIVENS CAPSULE STUDY N/A 06/15/2019   Procedure: GIVENS CAPSULE STUDY;  Surgeon: Danie Binder, MD;  [facility];  Service: Endoscopy;  Laterality: N/A;  7:30am   HALLUX VALGUS CORRECTION     bilateral foot surgery for bone repairs-multiple   LOBECTOMY Right 10/15/2016   Procedure: RIGHT UPPER LOBECTOMY;  Surgeon: [name], MD;  Location: [place];  Service: Thoracic;  Laterality: Right;   LYMPH NODE DISSECTION Right 10/15/2016   Procedure: LYMPH NODE DISSECTION;  Surgeon: [name], MD;  Location: [place];  Service: Thoracic;  Laterality: Right;   POLYPECTOMY  05/28/2019   Procedure: POLYPECTOMY;  Surgeon: Danie Binder, MD;  [facility];  Service: Endoscopy;;   SAVORY DILATION N/A  05/28/2019   Procedure: SAVORY DILATION;  Surgeon: Danie Binder, MD;  [facility];  Service: Endoscopy;  Laterality: N/A;  15/16/17   VAGINA RECONSTRUCTION SURGERY     tvt 2006- [place] (VATS)/WEDGE RESECTION Right 10/15/2016   Procedure: RIGHT VIDEO ASSISTED THORACOSCOPY (VATS)/WEDGE RESECTION;  Surgeon: [name], MD;  [facility];  Service: Thoracic;  Laterality: Right;    Social History   Socioeconomic History   Marital status: Single    Spouse name: Not on file   Number of  children: Not on file   Years of education: Not on file   Highest education level: Not on file  Occupational History   Not on file  Tobacco Use   Smoking status: Former    Packs/day: 1.00    Years: 32.00    Pack years: 32.00    Types: Cigarettes   Smokeless tobacco: Never   Tobacco comments:    quit smoking 20 yrs ago  Vaping Use   Vaping Use: Never used  Substance and Sexual Activity   Alcohol use: No    Comment: rarely   Drug use: No   Sexual activity: Not on file  Other Topics Concern   Not on file  Social History Narrative   Not on file   Social Determinants of Health   Financial Resource Strain: Not on file  Food Insecurity: Not on file  Transportation Needs: Not on file  Physical Activity: Not on file  Stress: Not on file  Social Connections: Not on file  Intimate Partner Violence: Not on file    Current Outpatient Medications on File Prior to Visit  Medication Sig Dispense Refill   ACCU-CHEK AVIVA PLUS test strip USE TO MONITOR GLUCOSE LEVELS TWICE A DAY E11.9 100 strip 12   ACCU-CHEK SOFTCLIX LANCETS lancets Use to monitor glucose levels BID; E11.9 (Patient taking differently: 1 each by Other route 2 (two) times daily. E11.9) 100 each 12   acetaminophen (TYLENOL) 500 MG tablet Take 1 tablet (500 mg total) by mouth every 6 (six) hours as needed. (Patient taking differently: Take 500 mg by mouth every 6 (six) hours as needed (for pain).) 30 tablet 0   atorvastatin (LIPITOR) 20 MG tablet Take 20 mg by mouth daily.  11   benzonatate (TESSALON) 100 MG capsule Take 1 capsule (100 mg total) by mouth every 8 (eight) hours. 30 capsule 0   Blood Glucose Calibration (GLUCOSE CONTROL) SOLN 1 Bottle by In Vitro route as needed. Use to calibrate Accu-Chek Aviva Plus device prn; please provide control solution compatible with Accu-Chek Aviva Plus device. (Patient taking differently: 1 each by Other route as needed (Use to calibrate glucometer). E11.9) 1 each 1   Blood Glucose  Monitoring Suppl (ACCU-CHEK AVIVA PLUS) w/Device KIT 1 each by Does not apply route 2 (two) times daily. Use to monitor glucose levels BID; E11.9 (Patient taking differently: 1 each by Does not apply route 2 (two) times daily. E11.9) 1 kit 0   bromocriptine (PARLODEL) 2.5 MG tablet TAKE 1 TABLET BY MOUTH EVERY DAY 30 tablet 11   cetirizine (ZYRTEC ALLERGY) 10 MG tablet Take 1 tablet (10 mg total) by mouth daily. 30 tablet 0   chlorthalidone (HYGROTON) 25 MG tablet Take 12.5 mg by mouth daily.      escitalopram (LEXAPRO) 10 MG tablet Take 1 tablet (10 mg total) by mouth every morning. 90 tablet 3   FARXIGA 10 MG TABS tablet TAKE 1  TABLET BY MOUTH EVERY DAY 30 tablet 11   insulin aspart (NOVOLOG) 100 UNIT/ML injection FOR USE IN PUMP, TOTAL OF  70 UNITS PER DAY 30 mL 8   Insulin Disposable Pump (V-GO 30) KIT 70 UNITS DAILY AS NEEDED DX E11.9, Z79.4 (NEITHER MEDICARE B NOR AETNA COVER THIS) 1 kit 11   lisinopril (ZESTRIL) 10 MG tablet Take 10 mg by mouth daily.     metFORMIN (GLUCOPHAGE-XR) 500 MG 24 hr tablet TAKE 1 TABLET BY MOUTH 2 TIMES DAILY. 60 tablet 11   pantoprazole (PROTONIX) 40 MG tablet 1 PO 30 MINS BEFORE YOUR FIRST AND LAST MEAL. 60 tablet 5   promethazine (PHENERGAN) 12.5 MG tablet 1-2 po q4-6 h prn nausea or vomiting 30 tablet 1   traZODone (DESYREL) 100 MG tablet Take 2 tablets (200 mg total) by mouth at bedtime. 180 tablet 3   triamcinolone cream (KENALOG) 0.1 % Apply 1 application topically 2 (two) times daily. 30 g 0   YUVAFEM 10 MCG TABS vaginal tablet Place 10 mcg vaginally See admin instructions. Twice a week as needed for vaginal dryness  11   fluticasone (FLONASE) 50 MCG/ACT nasal spray Place 1 spray into both nostrils daily for 14 days. 16 g 0   No current facility-administered medications on file prior to visit.    Allergies  Allergen Reactions   Synthroid [Levothyroxine Sodium] Anaphylaxis   Hydrochlorothiazide     Other reaction(s): cramps   Iodine Rash    I get a  skin rash sometimes if iodine applied to my skin.     Povidone-Iodine Itching and Rash    Family History  Problem Relation Age of Onset   Asthma Other    Diabetes Other    Arthritis Other    Cancer Mother    Cancer Brother    Anesthesia problems Neg Hx    Hypotension Neg Hx    Malignant hyperthermia Neg Hx    Pseudochol deficiency Neg Hx    Colon polyps Neg Hx    Colon cancer Neg Hx    Celiac disease Neg Hx    Pancreatic cancer Neg Hx    Stomach cancer Neg Hx    Ulcerative colitis Neg Hx    Crohn's disease Neg Hx     BP (!) 100/50   Pulse 96   Ht _0  (1.702 m)   Wt 235 lb (106.6 kg)   SpO2 96%   BMI 36.81 kg/m     Review of Systems She denies hypoglycemia    Objective:   Physical Exam Pulses: dorsalis pedis intact bilat.   MSK: no deformity of the feet CV: trace bilat leg edema Skin:  no ulcer on the feet.  normal color and temp on the feet.  Neuro: sensation is intact to touch on the feet.   A1c=7.1%    Assessment & Plan:  Insulin-requiring type 2 DM: uncontrolled  Patient Instructions  I have sent a prescription to your pharmacy, to change the Trulicity to [facility].   Please continue the same insulin and other 3 diabetes medications.   check your blood sugar twice a day.  vary the time of day when you check, between before the 3 meals, and at bedtime.  also check if you have symptoms of your blood sugar being too high or too low.  please keep a record of the readings and bring it to your next appointment here (or you can bring the meter itself).  You can write it on any piece  of paper.  please call us sooner if your blood sugar goes below 70, or if you have a lot of readings over 200.   Please come back for a follow-up appointment in 3 months.

## 2021-08-02 ENCOUNTER — Emergency Department (HOSPITAL_COMMUNITY): Payer: Medicare Other

## 2021-08-02 ENCOUNTER — Emergency Department (HOSPITAL_COMMUNITY)
Admission: EM | Admit: 2021-08-02 | Discharge: 2021-08-02 | Disposition: A | Discharge status: Home / Self Care | Payer: Medicare Other | Attending: Emergency Medicine | Admitting: Emergency Medicine

## 2021-08-02 ENCOUNTER — Encounter (HOSPITAL_COMMUNITY): Payer: Self-pay

## 2021-08-02 ENCOUNTER — Other Ambulatory Visit: Payer: Self-pay

## 2021-08-02 DIAGNOSIS — S92001A Unspecified fracture of right calcaneus, initial encounter for closed fracture: Secondary | ICD-10-CM | POA: Insufficient documentation

## 2021-08-02 DIAGNOSIS — S92151A Displaced avulsion fracture (chip fracture) of right talus, initial encounter for closed fracture: Secondary | ICD-10-CM | POA: Insufficient documentation

## 2021-08-02 DIAGNOSIS — Y92007 Garden or yard of unspecified non-institutional (private) residence as the place of occurrence of the external cause: Secondary | ICD-10-CM | POA: Diagnosis not present

## 2021-08-02 DIAGNOSIS — W010XXA Fall on same level from slipping, tripping and stumbling without subsequent striking against object, initial encounter: Secondary | ICD-10-CM | POA: Diagnosis not present

## 2021-08-02 DIAGNOSIS — S93401A Sprain of unspecified ligament of right ankle, initial encounter: Secondary | ICD-10-CM

## 2021-08-02 DIAGNOSIS — Y9301 Activity, walking, marching and hiking: Secondary | ICD-10-CM | POA: Diagnosis not present

## 2021-08-02 DIAGNOSIS — S82891A Other fracture of right lower leg, initial encounter for closed fracture: Secondary | ICD-10-CM

## 2021-08-02 DIAGNOSIS — S99911A Unspecified injury of right ankle, initial encounter: Secondary | ICD-10-CM | POA: Diagnosis present

## 2021-08-02 NOTE — ED Triage Notes (Signed)
Pt arrives with c/o right ankle and foto pain. 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 Ankle is swollen.

## 2021-08-02 NOTE — ED Provider Notes (Signed)
[facility] EMERGENCY DEPARTMENT Provider Note   CSN: [id number] Arrival date & time: 08/02/21  1509     History  Chief Complaint  Patient presents with   Ankle Pain    [name] is a 66 y.o. female who presents with complaint of right ankle injury.  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  She has had progressively worsening bruising and swelling of the right ankle.  She rates her pain as moderate.  No numbness or tingling.  She did not have any other injuries.  She denies hitting her head  Ankle Pain     Home Medications Prior to Admission medications   Medication Sig Start Date End Date Taking? Authorizing Provider  ACCU-CHEK AVIVA PLUS test strip USE TO MONITOR GLUCOSE LEVELS TWICE A DAY E11.9 11/21/20   Renato Shin, MD  ACCU-CHEK SOFTCLIX LANCETS lancets Use to monitor glucose levels BID; E11.9 Patient taking differently: 1 each by Other route 2 (two) times daily. E11.9 08/21/18   Renato Shin, MD  acetaminophen (TYLENOL) 500 MG tablet Take 1 tablet (500 mg total) by mouth every 6 (six) hours as needed. Patient taking differently: Take 500 mg by mouth every 6 (six) hours as needed (for pain). 08/07/17   Horton, Barbette Hair, MD  atorvastatin (LIPITOR) 20 MG tablet Take 20 mg by mouth daily. 04/27/17   [provider]  benzonatate (TESSALON) 100 MG capsule Take 1 capsule (100 mg total) by mouth every 8 (eight) hours. 10/11/19   Avegno, Darrelyn Hillock, FNP  Blood Glucose Calibration (GLUCOSE CONTROL) SOLN 1 Bottle by In Vitro route as needed. Use to calibrate Accu-Chek Aviva Plus device prn; please provide control solution compatible with Accu-Chek Aviva Plus device. Patient taking differently: 1 each by Other route as needed (Use to calibrate glucometer). E11.9 08/21/18   Renato Shin, MD  Blood  Glucose Monitoring Suppl (ACCU-CHEK AVIVA PLUS) w/Device KIT 1 each by Does not apply route 2 (two) times daily. Use to monitor glucose levels BID; E11.9 Patient taking differently: 1 each by Does not apply route 2 (two) times daily. E11.9 08/19/18   Renato Shin, MD  bromocriptine (PARLODEL) 2.5 MG tablet TAKE 1 TABLET BY MOUTH EVERY DAY 02/10/21   Renato Shin, MD  cetirizine (ZYRTEC ALLERGY) 10 MG tablet Take 1 tablet (10 mg total) by mouth daily. 10/11/19   Avegno, Darrelyn Hillock, FNP  chlorthalidone (HYGROTON) 25 MG tablet Take 12.5 mg by mouth daily.  11/26/16   [provider]  escitalopram (LEXAPRO) 10 MG tablet Take 1 tablet (10 mg total) by mouth every morning. 04/21/21   Cloria Spring, MD  FARXIGA 10 MG TABS tablet TAKE 1 TABLET BY MOUTH EVERY DAY 11/21/20   Renato Shin, MD  fluticasone [facility]) 50 MCG/ACT nasal spray Place 1 spray into both nostrils daily for 14 days. 10/11/19 02/06/21  Avegno, Darrelyn Hillock, FNP  insulin aspart (NOVOLOG) 100 UNIT/ML injection FOR USE IN PUMP, TOTAL OF  70 UNITS PER DAY 09/15/20   Renato Shin, MD  Insulin Disposable Pump (V-GO 30) KIT 70 UNITS DAILY AS NEEDED DX E11.9, Z79.4 (NEITHER MEDICARE B NOR AETNA COVER THIS) 05/12/21   Renato Shin, MD  lisinopril (ZESTRIL) 10 MG tablet Take 10 mg by mouth daily. 05/30/20   [provider]  metFORMIN (GLUCOPHAGE-XR) 500  MG 24 hr tablet TAKE 1 TABLET BY MOUTH 2 TIMES DAILY. 05/29/21   Renato Shin, MD  pantoprazole (PROTONIX) 40 MG tablet 1 PO 30 MINS BEFORE YOUR FIRST AND LAST MEAL. 11/21/20   Erenest Rasher, PA-C  promethazine (PHENERGAN) 12.5 MG tablet 1-2 po q4-6 h prn nausea or vomiting 11/10/19   Fields, Marga Melnick, MD  tirzepatide [facility]) 5 MG/0.5ML Pen Inject 5 mg into the skin once a week. 06/26/21   Renato Shin, MD  traZODone (DESYREL) 100 MG tablet Take 2 tablets (200 mg total) by mouth at bedtime. 04/21/21   Cloria Spring, MD  triamcinolone cream (KENALOG) 0.1 % Apply 1 application  topically 2 (two) times daily. 03/30/21   Wurst, [place], PA-C  YUVAFEM 10 MCG TABS vaginal tablet Place 10 mcg vaginally See admin instructions. Twice a week as needed for vaginal dryness 09/29/16   [provider]      Allergies    Synthroid [levothyroxine sodium], Hydrochlorothiazide, Iodine, and Povidone-iodine    Review of Systems   Review of Systems As per HPI Physical Exam Updated Vital Signs BP (!) 141/62 (BP Location: Left Arm)    Pulse 87    Temp 97.7 F (36.5 C)    Resp 18    Ht _0  (1.702 m)    Wt 106.6 kg    SpO2 96%    BMI 36.81 kg/m  Physical Exam Vitals and nursing note reviewed.  Constitutional:      General: She is not in acute distress.    Appearance: She is well-developed. She is not diaphoretic.  HENT:     Head: Normocephalic and atraumatic.     Right Ear: External ear normal.     Left Ear: External ear normal.     Nose: Nose normal.     Mouth/Throat:     Mouth: Mucous membranes are moist.  Eyes:     General: No scleral icterus.    Conjunctiva/sclera: Conjunctivae normal.  Cardiovascular:     Rate and Rhythm: Normal rate and regular rhythm.     Heart sounds: Normal heart sounds. No murmur heard.   No friction rub. No gallop.  Pulmonary:     Effort: Pulmonary effort is normal. No respiratory distress.     Breath sounds: Normal breath sounds.  Abdominal:     General: Bowel sounds are normal. There is no distension.     Palpations: Abdomen is soft. There is no mass.     Tenderness: There is no abdominal tenderness. There is no guarding.  Musculoskeletal:     Cervical back: Normal range of motion.     Comments: Right ankle with swelling and bruising over the right lateral malleolus, full range of motion, normal strength, cap refill less than 2 seconds  Skin:    General: Skin is warm and dry.  Neurological:     Mental Status: She is alert and oriented to person, place, and time.  Psychiatric:        Behavior: Behavior normal.    ED Results /  Procedures / Treatments   Labs (all labs ordered are listed, but only abnormal results are displayed) Labs Reviewed - No data to display  EKG None  Radiology DG Ankle Complete Right  Result Date: 08/02/2021 CLINICAL DATA:  Right foot and ankle pain after a fall EXAM: RIGHT FOOT COMPLETE - 3+ VIEW; RIGHT ANKLE - COMPLETE 3+ VIEW COMPARISON:  None. FINDINGS: There is a 6 mm bone fragment adjacent to the lateral aspect  of the anterior calcaneal body near the calcaneocuboid joint suspicious for a small avulsion fracture. There is also cortical irregularity of the dorsal talus with a tiny adjacent bone fragment suspicious for an additional small avulsion fracture. Adjacent soft tissue swelling is noted in both of these regions. There is no dislocation. A screw is noted in the fourth metatarsal head. Deformity of the fifth metatarsal neck may reflect a remote, healed fracture. There are moderate-sized posterior and plantar calcaneal enthesophytes. IMPRESSION: Suspected small avulsion fractures of the calcaneus and talus. Electronically Signed   By: Logan Bores M.D.   On: 08/02/2021 16:36   DG Foot Complete Right  Result Date: 08/02/2021 CLINICAL DATA:  Right foot and ankle pain after a fall EXAM: RIGHT FOOT COMPLETE - 3+ VIEW; RIGHT ANKLE - COMPLETE 3+ VIEW COMPARISON:  None. FINDINGS: There is a 6 mm bone fragment adjacent to the lateral aspect of the anterior calcaneal body near the calcaneocuboid joint suspicious for a small avulsion fracture. There is also cortical irregularity of the dorsal talus with a tiny adjacent bone fragment suspicious for an additional small avulsion fracture. Adjacent soft tissue swelling is noted in both of these regions. There is no dislocation. A screw is noted in the fourth metatarsal head. Deformity of the fifth metatarsal neck may reflect a remote, healed fracture. There are moderate-sized posterior and plantar calcaneal enthesophytes. IMPRESSION: Suspected small avulsion  fractures of the calcaneus and talus. Electronically Signed   By: Logan Bores M.D.   On: 08/02/2021 16:36    Procedures Procedures    Medications Ordered in ED Medications - No data to display  ED Course/ Medical Decision Making/ A&P                           Medical Decision Making  Patient here with inversion injury of the ankle. I interpreted her right foot and ankle x-ray which shows a tiny avulsion fracture in the foot.  Mostly this represents an ankle sprain.  Patient given cam walker boot, outpatient follow-up with orthopedics.  She appears otherwise appropriate for discharge at this time Final Clinical Impression(s) / ED Diagnoses Final diagnoses:  None    Rx / DC Orders ED Discharge Orders     None         Margarita Mail, PA-C 08/02/21 Elmer Picker, MD 08/04/21 1152

## 2021-08-02 NOTE — Discharge Instructions (Signed)
Contact a health care provider if: You have rapidly increasing bruising or swelling. Your pain is not relieved with medicine. Get help right away if: Your foot or toes become numb or blue. You have severe pain that gets worse.

## 2021-08-09 ENCOUNTER — Encounter: Payer: Self-pay | Admitting: Orthopedic Surgery

## 2021-08-09 ENCOUNTER — Ambulatory Visit (INDEPENDENT_AMBULATORY_CARE_PROVIDER_SITE_OTHER): Payer: Medicare Other | Admitting: Orthopedic Surgery

## 2021-08-09 ENCOUNTER — Other Ambulatory Visit: Payer: Self-pay

## 2021-08-09 DIAGNOSIS — S93401A Sprain of unspecified ligament of right ankle, initial encounter: Secondary | ICD-10-CM

## 2021-08-09 DIAGNOSIS — S92154A Nondisplaced avulsion fracture (chip fracture) of right talus, initial encounter for closed fracture: Secondary | ICD-10-CM

## 2021-08-09 NOTE — Patient Instructions (Signed)
Instructions  1.  You have sustained an ankle sprain, or similar exercises that can be treated as an ankle sprain.  **These exercises can also be used as part of recovery from an ankle fracture.  2.  I encourage you to stay on your feet and gradually remove your walking boot.   3.  Below are some exercises that you can complete on your own to improve your symptoms.  4.  As an alternative, you can search for ankle sprain exercises online, and can see some demonstrations on YouTube  5.  If you are having difficulty with these exercises, we can also prescribe formal physical therapy  Ankle Exercises Ask your health care provider which exercises are safe for you. Do exercises exactly as told by your health care provider and adjust them as directed. It is normal to feel mild stretching, pulling, tightness, or mild discomfort as you do these exercises. Stop right away if you feel sudden pain or your pain gets worse. Do not begin these exercises until told by your health care provider.  Stretching and range-of-motion exercises These exercises warm up your muscles and joints and improve the movement and flexibility of your ankle. These exercises may also help to relieve pain.  Dorsiflexion/plantar flexion  Sit with your R knee straight or bent. Do not rest your foot on anything. Flex your left ankle to tilt the top of your foot toward your shin. This is called dorsiflexion. Hold this position for 5 seconds. Point your toes downward to tilt the top of your foot away from your shin. This is called plantar flexion. Hold this position for 5 seconds. Repeat 10 times. Complete this exercise 2-3 times a day.  As tolerated  Ankle alphabet  Sit with your R foot supported at your lower leg. Do not rest your foot on anything. Make sure your foot has room to move freely. Think of your R foot as a paintbrush: Move your foot to trace each letter of the alphabet in the air. Keep your hip and knee still while  you trace the letters. Trace every letter from A to Z. Make the letters as large as you can without causing or increasing any discomfort.  Repeat 2-3 times. Complete this exercise 2-3 times a day.   Strengthening exercises These exercises build strength and endurance in your ankle. Endurance is the ability to use your muscles for a long time, even after they get tired. Dorsiflexors These are muscles that lift your foot up. Secure a rubber exercise band or tube to an object, such as a table leg, that will stay still when the band is pulled. Secure the other end around your R foot. Sit on the floor, facing the object with your R leg extended. The band or tube should be slightly tense when your foot is relaxed. Slowly flex your R ankle and toes to bring your foot toward your shin. Hold this position for 5 seconds. Slowly return your foot to the starting position, controlling the band as you do that. Repeat 10 times. Complete this exercise 2-3 times a day.  Plantar flexors These are muscles that push your foot down. Sit on the floor with your R leg extended. Loop a rubber exercise band or tube around the ball of your R foot. The ball of your foot is on the walking surface, right under your toes. The band or tube should be slightly tense when your foot is relaxed. Slowly point your toes downward, pushing them away from  you. Hold this position for 5 seconds. Slowly release the tension in the band or tube, controlling smoothly until your foot is back in the starting position. Repeat 10 times. Complete this exercise 2-3 times a day.  Towel curls  Sit in a chair on a non-carpeted surface, and put your feet on the floor. Place a towel in front of your feet. Keeping your heel on the floor, put your R foot on the towel. Pull the towel toward you by grabbing the towel with your toes and curling them under. Keep your heel on the floor. Let your toes relax. Grab the towel again. Keep pulling the  towel until it is completely underneath your foot. Repeat 10 times. Complete this exercise 2-3 times a day.  Standing plantar flexion This is an exercise in which you use your toes to lift your body's weight while standing. Stand with your feet shoulder-width apart. Keep your weight spread evenly over the width of your feet while you rise up on your toes. Use a wall or table to steady yourself if needed, but try not to use it for support. If this exercise is too easy, try these options: Shift your weight toward your R leg until you feel challenged. If told by your health care provider, lift your uninjured leg off the floor. Hold this position for 5 seconds. Repeat 10 times. Complete this exercise 2-3 times a day.  Tandem walking Stand with one foot directly in front of the other. Slowly raise your back foot up, lifting your heel before your toes, and place it directly in front of your other foot. Continue to walk in this heel-to-toe way. Have a countertop or wall nearby to use if needed to keep your balance, but try not to hold onto anything for support.  Repeat 10 times. Complete this exercise 2-3 times a day.   Document Revised: 04/12/2018 Document Reviewed: 04/14/2018 Elsevier Patient Education  [place].

## 2021-08-09 NOTE — Progress Notes (Signed)
New Patient Visit  Assessment: [name] is a 66 y.o. female with the following: Right ankle sprain, with minimally displaced avulsion fracture of the anterolateral talus  Plan: Reviewed radiographs with patient in clinic today.  Anticipate continued improvement in her pain, swelling and function.  The swelling is already significantly improved.  She can start to wean out of the use of the walking boot.  I provided her with ankle sprain exercises, for her to initiate immediately.  Continue with medications as needed.  She is aware that this may linger for several weeks following the injury, but he do not think that she will require any further.  If she is struggling to improve, we can refer her to physical therapy.   Follow-up: Return if symptoms worsen or fail to improve.  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 female who presents for evaluation of a right ankle pain.  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  She twisted her ankle.  She presents to the emergency department for evaluation.  She is noted to have a small avulsion fracture.  She also had significant swelling in her ankle.  She has been walking with a walking boot, and limited weightbearing.  She does note improvements in the swelling and the pain.  She does have some bruising.  She is not taking any medications on a consistent basis at this time.   Review of Systems: No fevers or chills No numbness or tingling No chest pain No shortness of breath No bowel or bladder dysfunction No GI distress No headaches   Medical History:  Past Medical History:  Diagnosis Date   Adenocarcinoma of right lung, stage 1 (HCC) 11/29/2016   Cancer (HCC)    Chronic pain     Depression    takes Lexapro daily   Diabetes mellitus    takes Metformin daily and has an insulin pump.Fasting blood sugar [id number]   GERD (gastroesophageal reflux disease)    takes Pantoprazole daily   History of colon polyps    benign   Hyperlipidemia    takes Atorvastatin daily   Hypertension    takes Amlodipine and Lisinopril daily   Insomnia    takes Trazodone nightly   Nocturia    Thyroid disease    Urinary frequency    Urinary urgency     Past Surgical History:  Procedure Laterality Date   ABDOMINAL HYSTERECTOMY     ABDOMINAL SURGERY     BIOPSY  05/28/2019   Procedure: BIOPSY;  Surgeon: Danie Binder, MD;  [facility];  Service: Endoscopy;;  random colon/gastric/duodenum   COLONOSCOPY  2005 [facility]   TICs, IH   COLONOSCOPY N/A 03/08/2014   Procedure: COLONOSCOPY;  Surgeon: Danie Binder, MD;  [facility];  Service: Endoscopy;  Laterality: N/A;  1015   COLONOSCOPY WITH PROPOFOL N/A 05/28/2019   Procedure: COLONOSCOPY WITH PROPOFOL;  Surgeon: Danie Binder, MD;  [facility];  Service: Endoscopy;  Laterality: N/A;  9:15am   CYSTOSTOMY  11/22/2011   Procedure: CYSTOSTOMY SUPRAPUBIC;  Surgeon: Marissa Nestle, MD;  Location: [facility];  Service: Urology;  Laterality: N/A;  ESOPHAGOGASTRODUODENOSCOPY N/A 03/08/2014   Procedure: ESOPHAGOGASTRODUODENOSCOPY (EGD);  Surgeon: Danie Binder, MD;  [facility];  Service: Endoscopy;  Laterality: N/A;   ESOPHAGOGASTRODUODENOSCOPY     ESOPHAGOGASTRODUODENOSCOPY (EGD) WITH PROPOFOL N/A 05/28/2019   Procedure: ESOPHAGOGASTRODUODENOSCOPY (EGD) WITH PROPOFOL;  Surgeon: Danie Binder, MD;  [facility];  Service: Endoscopy;  Laterality: N/A;   GIVENS CAPSULE STUDY N/A 06/15/2019   Procedure: GIVENS CAPSULE STUDY;  Surgeon: Danie Binder, MD;  [facility];  Service: Endoscopy;  Laterality: N/A;  7:30am   HALLUX VALGUS CORRECTION     bilateral foot surgery for bone  repairs-multiple   LOBECTOMY Right 10/15/2016   Procedure: RIGHT UPPER LOBECTOMY;  Surgeon: [name], MD;  Location: [place];  Service: Thoracic;  Laterality: Right;   LYMPH NODE DISSECTION Right 10/15/2016   Procedure: LYMPH NODE DISSECTION;  Surgeon: [name], MD;  Location: [place];  Service: Thoracic;  Laterality: Right;   POLYPECTOMY  05/28/2019   Procedure: POLYPECTOMY;  Surgeon: Danie Binder, MD;  [facility];  Service: Endoscopy;;   SAVORY DILATION N/A 05/28/2019   Procedure: SAVORY DILATION;  Surgeon: Danie Binder, MD;  [facility];  Service: Endoscopy;  Laterality: N/A;  15/16/17   VAGINA RECONSTRUCTION SURGERY     tvt 2006- [place] (VATS)/WEDGE RESECTION Right 10/15/2016   Procedure: RIGHT VIDEO ASSISTED THORACOSCOPY (VATS)/WEDGE RESECTION;  Surgeon: [name], MD;  Location: [place];  Service: Thoracic;  Laterality: Right;    Family History  Problem Relation Age of Onset   Asthma Other    Diabetes Other    Arthritis Other    Cancer Mother    Cancer Brother    Anesthesia problems Neg Hx    Hypotension Neg Hx    Malignant hyperthermia Neg Hx    Pseudochol deficiency Neg Hx    Colon polyps Neg Hx    Colon cancer Neg Hx    Celiac disease Neg Hx    Pancreatic cancer Neg Hx    Stomach cancer Neg Hx    Ulcerative colitis Neg Hx    Crohn's disease Neg Hx    Social History   Tobacco Use   Smoking status: Former    Packs/day: 1.00    Years: 32.00    Pack years: 32.00    Types: Cigarettes   Smokeless tobacco: Never   Tobacco comments:    quit smoking 20 yrs ago  Vaping Use   Vaping Use: Never used  Substance Use Topics   Alcohol use: No    Comment: rarely   Drug use: No    Allergies  Allergen Reactions   Synthroid [Levothyroxine Sodium] Anaphylaxis   Hydrochlorothiazide     Other reaction(s): cramps   Iodine Rash    I get a skin rash sometimes if iodine applied to  my skin.     Povidone-Iodine Itching and Rash    Current Meds  Medication Sig   ACCU-CHEK AVIVA PLUS test strip USE TO MONITOR GLUCOSE LEVELS TWICE A DAY E11.9   ACCU-CHEK SOFTCLIX LANCETS lancets Use to monitor glucose levels BID; E11.9 (Patient taking differently: 1 each by Other route 2 (two) times daily. E11.9)   acetaminophen (TYLENOL) 500 MG tablet Take 1 tablet (500 mg total) by mouth every 6 (six) hours as needed. (Patient taking differently: Take 500 mg by mouth every 6 (six) hours as needed (for pain).)   atorvastatin (LIPITOR) 20  MG tablet Take 20 mg by mouth daily.   benzonatate (TESSALON) 100 MG capsule Take 1 capsule (100 mg total) by mouth every 8 (eight) hours.   Blood Glucose Calibration (GLUCOSE CONTROL) SOLN 1 Bottle by In Vitro route as needed. Use to calibrate Accu-Chek Aviva Plus device prn; please provide control solution compatible with Accu-Chek Aviva Plus device. (Patient taking differently: 1 each by Other route as needed (Use to calibrate glucometer). E11.9)   Blood Glucose Monitoring Suppl (ACCU-CHEK AVIVA PLUS) w/Device KIT 1 each by Does not apply route 2 (two) times daily. Use to monitor glucose levels BID; E11.9 (Patient taking differently: 1 each by Does not apply route 2 (two) times daily. E11.9)   bromocriptine (PARLODEL) 2.5 MG tablet TAKE 1 TABLET BY MOUTH EVERY DAY   cetirizine (ZYRTEC ALLERGY) 10 MG tablet Take 1 tablet (10 mg total) by mouth daily.   chlorthalidone (HYGROTON) 25 MG tablet Take 12.5 mg by mouth daily.    escitalopram (LEXAPRO) 10 MG tablet Take 1 tablet (10 mg total) by mouth every morning.   FARXIGA 10 MG TABS tablet TAKE 1 TABLET BY MOUTH EVERY DAY   insulin aspart (NOVOLOG) 100 UNIT/ML injection FOR USE IN PUMP, TOTAL OF  70 UNITS PER DAY   Insulin Disposable Pump (V-GO 30) KIT 70 UNITS DAILY AS NEEDED DX E11.9, Z79.4 (NEITHER MEDICARE B NOR AETNA COVER THIS)   lisinopril (ZESTRIL) 10 MG tablet Take 10 mg by mouth daily.   metFORMIN  (GLUCOPHAGE-XR) 500 MG 24 hr tablet TAKE 1 TABLET BY MOUTH 2 TIMES DAILY.   pantoprazole (PROTONIX) 40 MG tablet 1 PO 30 MINS BEFORE YOUR FIRST AND LAST MEAL.   promethazine (PHENERGAN) 12.5 MG tablet 1-2 po q4-6 h prn nausea or vomiting   tirzepatide (MOUNJARO) 5 MG/0.5ML Pen Inject 5 mg into the skin once a week.   traZODone (DESYREL) 100 MG tablet Take 2 tablets (200 mg total) by mouth at bedtime.   triamcinolone cream (KENALOG) 0.1 % Apply 1 application topically 2 (two) times daily.   YUVAFEM 10 MCG TABS vaginal tablet Place 10 mcg vaginally See admin instructions. Twice a week as needed for vaginal dryness    Objective: There were no vitals taken for this visit.  Physical Exam:  General: Alert and oriented. and No acute distress. Gait: Right sided antalgic gait.  Evaluation of the right foot demonstrates mild swelling without bruising.  She has some tenderness to palpation over the anterolateral ankle.  Tenderness to palpation along the course of the peroneal tendons to the base of the fifth metatarsal.  Sensation is intact over the dorsum of the foot.  She tolerates gentle inversion of the ankle without discomfort.  Easily gets to a plantigrade position.  Toes are warm and well-perfused.  IMAGING: I personally reviewed images previously obtained from the ED  Ankle x-rays without acute injury.  X-rays of the foot demonstrates a minimally displaced fracture of the anterior aspect of the talus.  New Medications:  No orders of the defined types were placed in this encounter.     Mordecai Rasmussen, MD  08/09/2021 12:35 PM

## 2021-08-11 ENCOUNTER — Other Ambulatory Visit: Payer: Self-pay | Admitting: Endocrinology

## 2021-08-11 ENCOUNTER — Encounter: Payer: Self-pay | Admitting: Endocrinology

## 2021-08-11 MED ORDER — LANTUS SOLOSTAR 100 UNIT/ML ~~LOC~~ SOPN: 30.0000 [IU] | PEN_INJECTOR | Freq: Every day | SUBCUTANEOUS | 99 refills | Status: DC

## 2021-08-11 MED ORDER — NOVOLOG FLEXPEN 100 UNIT/ML ~~LOC~~ SOPN: 10.0000 [IU] | PEN_INJECTOR | Freq: Three times a day (TID) | SUBCUTANEOUS | 11 refills | Status: DC

## 2021-08-14 ENCOUNTER — Telehealth: Payer: Self-pay | Admitting: Endocrinology

## 2021-08-14 ENCOUNTER — Other Ambulatory Visit: Payer: Self-pay

## 2021-08-14 DIAGNOSIS — Z794 Long term (current) use of insulin: Secondary | ICD-10-CM

## 2021-08-14 DIAGNOSIS — E119 Type 2 diabetes mellitus without complications: Secondary | ICD-10-CM

## 2021-08-14 MED ORDER — PEN NEEDLES 31G X 8 MM MISC: 5 refills | Status: DC

## 2021-08-14 NOTE — Telephone Encounter (Signed)
Refill for: insulin aspart (NOVOLOG FLEXPEN) 100 UNIT/ML FlexPen insulin glargine (LANTUS SOLOSTAR) 100 UNIT/ML Solostar Pen  Was requested on 08/11/2021. However, the pen needles were not included. Also, the pen needles do not show on this account. Patient need this prescription to be forward to  [id number] - [place], [place] Phone:  [phone]  Fax:  [phone]

## 2021-08-14 NOTE — Telephone Encounter (Signed)
Message sent thru MyChart for pt to contact the office to schedule an appt. Before any refills.

## 2021-08-17 NOTE — Telephone Encounter (Signed)
Error

## 2021-09-11 ENCOUNTER — Ambulatory Visit
Admission: RE | Admit: 2021-09-11 | Discharge: 2021-09-11 | Disposition: A | Discharge status: Home / Self Care | Payer: Medicare Other | Source: Ambulatory Visit | Attending: Family Medicine | Admitting: Family Medicine

## 2021-09-11 DIAGNOSIS — E2839 Other primary ovarian failure: Secondary | ICD-10-CM

## 2021-09-12 ENCOUNTER — Telehealth (HOSPITAL_COMMUNITY): Payer: Self-pay | Admitting: Psychiatry

## 2021-09-14 ENCOUNTER — Telehealth (INDEPENDENT_AMBULATORY_CARE_PROVIDER_SITE_OTHER): Payer: Medicare Other | Admitting: Psychiatry

## 2021-09-14 ENCOUNTER — Other Ambulatory Visit: Payer: Self-pay

## 2021-09-14 ENCOUNTER — Encounter (HOSPITAL_COMMUNITY): Payer: Self-pay | Admitting: Psychiatry

## 2021-09-14 DIAGNOSIS — F331 Major depressive disorder, recurrent, moderate: Secondary | ICD-10-CM

## 2021-09-14 MED ORDER — TRAZODONE HCL 100 MG PO TABS: 200.0000 mg | ORAL_TABLET | Freq: Every day | ORAL | 3 refills | Status: DC

## 2021-09-14 MED ORDER — ESCITALOPRAM OXALATE 10 MG PO TABS: 10.0000 mg | ORAL_TABLET | Freq: Every morning | ORAL | 3 refills | Status: DC
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
## 2021-09-22 ENCOUNTER — Other Ambulatory Visit (HOSPITAL_COMMUNITY): Payer: Self-pay | Admitting: Family Medicine

## 2021-09-22 DIAGNOSIS — E7800 Pure hypercholesterolemia, unspecified: Secondary | ICD-10-CM

## 2021-09-26 ENCOUNTER — Ambulatory Visit (INDEPENDENT_AMBULATORY_CARE_PROVIDER_SITE_OTHER): Payer: Medicare Other | Admitting: Endocrinology

## 2021-09-26 ENCOUNTER — Other Ambulatory Visit: Payer: Self-pay

## 2021-09-26 VITALS — BP 130/78 | HR 87 | Ht 67.0 in | Wt 238.2 lb

## 2021-09-26 DIAGNOSIS — E119 Type 2 diabetes mellitus without complications: Secondary | ICD-10-CM | POA: Diagnosis not present

## 2021-09-26 DIAGNOSIS — Z794 Long term (current) use of insulin: Secondary | ICD-10-CM | POA: Diagnosis not present

## 2021-09-26 LAB — POCT GLYCOSYLATED HEMOGLOBIN (HGB A1C): Hemoglobin A1C: 6.7 % — AB (ref 4.0–5.6)

## 2021-09-26 MED ORDER — LANTUS SOLOSTAR 100 UNIT/ML ~~LOC~~ SOPN: 20.0000 [IU] | PEN_INJECTOR | Freq: Every day | SUBCUTANEOUS | 99 refills | Status: DC

## 2021-09-26 MED ORDER — TIRZEPATIDE 10 MG/0.5ML ~~LOC~~ SOAJ: 10.0000 mg | SUBCUTANEOUS | 3 refills | Status: DC

## 2021-09-26 NOTE — Progress Notes (Signed)
Subjective:    Patient ID: [name], female    DOB: 20-Jul-1956, 66 y.o.   MRN: [id number]  HPI Pt returns for f/u of diabetes mellitus: DM type: Insulin-requiring type 2.   Dx'ed: 7494 Complications: none Therapy: insulin since soon after dx (now V-GO-30), 3 oral meds, and Trulicity.   GDM: never (G0) DKA: never Severe hypoglycemia: never.   Pancreatitis: never.  Other: med dosages (esp Trulicity) are limited by nausea; she changed V-GO to multiple daily injections, due to cost.  Interval history: She takes approx 49-67 clicks per day, via the V-GO-30.  she brings her meter with her cbg's which I have reviewed today.  cbg varies from 77-209.   She feels as thought the Trulicity no longer works. She has mild hypoglycemia approx QD.  This happens before lunch and in the afternoon.   Past Medical History:  Diagnosis Date   Adenocarcinoma of right lung, stage 1 ([place]) 11/29/2016   Cancer (HCC)    Chronic pain    Depression    takes Lexapro daily   Diabetes mellitus    takes Metformin daily and has an insulin pump.Fasting blood sugar runs250   GERD (gastroesophageal reflux disease)    takes Pantoprazole daily   History of colon polyps    benign   Hyperlipidemia    takes Atorvastatin daily   Hypertension    takes Amlodipine and Lisinopril daily   Insomnia    takes Trazodone nightly   Nocturia    Thyroid disease    Urinary frequency    Urinary urgency     Past Surgical History:  Procedure Laterality Date   ABDOMINAL HYSTERECTOMY     ABDOMINAL SURGERY     BIOPSY  05/28/2019   Procedure: BIOPSY;  Surgeon: Danie Binder, MD;  [facility];  Service: Endoscopy;;  random colon/gastric/duodenum   COLONOSCOPY  2005 MAC   TICs, IH   COLONOSCOPY N/A 03/08/2014   Procedure: COLONOSCOPY;  Surgeon: Danie Binder, MD;  [facility];  Service: Endoscopy;  Laterality: N/A;  1015   COLONOSCOPY WITH PROPOFOL N/A 05/28/2019   Procedure: COLONOSCOPY WITH  PROPOFOL;  Surgeon: Danie Binder, MD;  [facility];  Service: Endoscopy;  Laterality: N/A;  9:15am   CYSTOSTOMY  11/22/2011   Procedure: CYSTOSTOMY SUPRAPUBIC;  Surgeon: Marissa Nestle, MD;  [facility];  Service: Urology;  Laterality: N/A;   ESOPHAGOGASTRODUODENOSCOPY N/A 03/08/2014   Procedure: ESOPHAGOGASTRODUODENOSCOPY (EGD);  Surgeon: Danie Binder, MD;  [facility];  Service: Endoscopy;  Laterality: N/A;   ESOPHAGOGASTRODUODENOSCOPY     ESOPHAGOGASTRODUODENOSCOPY (EGD) WITH PROPOFOL N/A 05/28/2019   Procedure: ESOPHAGOGASTRODUODENOSCOPY (EGD) WITH PROPOFOL;  Surgeon: Danie Binder, MD;  [facility];  Service: Endoscopy;  Laterality: N/A;   GIVENS CAPSULE STUDY N/A 06/15/2019   Procedure: GIVENS CAPSULE STUDY;  Surgeon: Danie Binder, MD;  [facility];  Service: Endoscopy;  Laterality: N/A;  7:30am   HALLUX VALGUS CORRECTION     bilateral foot surgery for bone repairs-multiple   LOBECTOMY Right 10/15/2016   Procedure: RIGHT UPPER LOBECTOMY;  Surgeon: [name], MD;  Location: [place];  Service: Thoracic;  Laterality: Right;   LYMPH NODE DISSECTION Right 10/15/2016   Procedure: LYMPH NODE DISSECTION;  Surgeon: [name], MD;  Location: [place];  Service: Thoracic;  Laterality: Right;   POLYPECTOMY  05/28/2019   Procedure: POLYPECTOMY;  Surgeon: Danie Binder, MD;  [facility];  Service: Endoscopy;;   SAVORY DILATION N/A 05/28/2019   Procedure: SAVORY DILATION;  Surgeon: Danie Binder, MD;  [facility];  Service: Endoscopy;  Laterality: N/A;  15/16/17   VAGINA RECONSTRUCTION SURGERY     tvt 2006- [place] (VATS)/WEDGE RESECTION Right 10/15/2016   Procedure: RIGHT VIDEO ASSISTED THORACOSCOPY (VATS)/WEDGE RESECTION;  Surgeon: [name], MD;  [facility];  Service: Thoracic;  Laterality: Right;    Social History   Socioeconomic History    Marital status: Single    Spouse name: Not on file   Number of children: Not on file   Years of education: Not on file   Highest education level: Not on file  Occupational History   Not on file  Tobacco Use   Smoking status: Former    Packs/day: 1.00    Years: 32.00    Pack years: 32.00    Types: Cigarettes   Smokeless tobacco: Never   Tobacco comments:    quit smoking 20 yrs ago  Vaping Use   Vaping Use: Never used  Substance and Sexual Activity   Alcohol use: No    Comment: rarely   Drug use: No   Sexual activity: Not on file  Other Topics Concern   Not on file  Social History Narrative   Not on file   Social Determinants of Health   Financial Resource Strain: Not on file  Food Insecurity: Not on file  Transportation Needs: Not on file  Physical Activity: Not on file  Stress: Not on file  Social Connections: Not on file  Intimate Partner Violence: Not on file    Current Outpatient Medications on File Prior to Visit  Medication Sig Dispense Refill   ACCU-CHEK AVIVA PLUS test strip USE TO MONITOR GLUCOSE LEVELS TWICE A DAY E11.9 100 strip 12   ACCU-CHEK SOFTCLIX LANCETS lancets Use to monitor glucose levels BID; E11.9 (Patient taking differently: 1 each by Other route 2 (two) times daily. E11.9) 100 each 12   acetaminophen (TYLENOL) 500 MG tablet Take 1 tablet (500 mg total) by mouth every 6 (six) hours as needed. (Patient taking differently: Take 500 mg by mouth every 6 (six) hours as needed (for pain).) 30 tablet 0   atorvastatin (LIPITOR) 20 MG tablet Take 20 mg by mouth daily.  11   benzonatate (TESSALON) 100 MG capsule Take 1 capsule (100 mg total) by mouth every 8 (eight) hours. 30 capsule 0   Blood Glucose Calibration (GLUCOSE CONTROL) SOLN 1 Bottle by In Vitro route as needed. Use to calibrate Accu-Chek Aviva Plus device prn; please provide control solution compatible with Accu-Chek Aviva Plus device. (Patient taking differently: 1 each by Other route as  needed (Use to calibrate glucometer). E11.9) 1 each 1   Blood Glucose Monitoring Suppl (ACCU-CHEK AVIVA PLUS) w/Device KIT 1 each by Does not apply route 2 (two) times daily. Use to monitor glucose levels BID; E11.9 (Patient taking differently: 1 each by Does not apply route 2 (two) times daily. E11.9) 1 kit 0   bromocriptine (PARLODEL) 2.5 MG tablet TAKE 1 TABLET BY MOUTH EVERY DAY 30 tablet 11   cetirizine (ZYRTEC ALLERGY) 10 MG tablet Take 1 tablet (10 mg total) by mouth daily. 30 tablet 0   chlorthalidone (HYGROTON) 25 MG tablet Take 12.5 mg by mouth daily.      escitalopram (LEXAPRO) 10 MG tablet Take 1 tablet (10 mg total) by mouth every morning. [place]  tablet 3   FARXIGA 10 MG TABS tablet TAKE 1 TABLET BY MOUTH EVERY DAY 30 tablet 11   insulin aspart (NOVOLOG FLEXPEN) 100 UNIT/ML FlexPen Inject 10 Units into the skin 3 (three) times daily with meals. 15 mL 11   Insulin Pen Needle (PEN NEEDLES) 31G X 8 MM MISC Use As Directed 100 each 5   lisinopril (ZESTRIL) 10 MG tablet Take 10 mg by mouth daily.     metFORMIN (GLUCOPHAGE-XR) 500 MG 24 hr tablet TAKE 1 TABLET BY MOUTH 2 TIMES DAILY. 60 tablet 11   pantoprazole (PROTONIX) 40 MG tablet 1 PO 30 MINS BEFORE YOUR FIRST AND LAST MEAL. 60 tablet 5   promethazine (PHENERGAN) 12.5 MG tablet 1-2 po q4-6 h prn nausea or vomiting 30 tablet 1   traZODone (DESYREL) 100 MG tablet Take 2 tablets (200 mg total) by mouth at bedtime. 180 tablet 3   triamcinolone cream (KENALOG) 0.1 % Apply 1 application topically 2 (two) times daily. 30 g 0   YUVAFEM 10 MCG TABS vaginal tablet Place 10 mcg vaginally See admin instructions. Twice a week as needed for vaginal dryness  11   fluticasone (FLONASE) 50 MCG/ACT nasal spray Place 1 spray into both nostrils daily for 14 days. 16 g 0   No current facility-administered medications on file prior to visit.    Allergies  Allergen Reactions   Synthroid [Levothyroxine Sodium] Anaphylaxis   Hydrochlorothiazide     Other  reaction(s): cramps   Iodine Rash    I get a skin rash sometimes if iodine applied to my skin.     Povidone-Iodine Itching and Rash    Family History  Problem Relation Age of Onset   Asthma Other    Diabetes Other    Arthritis Other    Cancer Mother    Cancer Brother    Anesthesia problems Neg Hx    Hypotension Neg Hx    Malignant hyperthermia Neg Hx    Pseudochol deficiency Neg Hx    Colon polyps Neg Hx    Colon cancer Neg Hx    Celiac disease Neg Hx    Pancreatic cancer Neg Hx    Stomach cancer Neg Hx    Ulcerative colitis Neg Hx    Crohn's disease Neg Hx     BP 130/78    Pulse 87    Ht _0  (1.702 m)    Wt 238 lb 3.2 oz (108 kg)    SpO2 96%    BMI 37.31 kg/m    Review of Systems Denies N/V/HB/bloating.      Objective:   Physical Exam VITAL SIGNS:  See vs page GENERAL: no distress Pulses: dorsalis pedis intact bilat.   MSK: no deformity of the feet CV: 1+ bilat leg edema Skin:  no ulcer on the feet.  normal color and temp on the feet.   Neuro: sensation is intact to touch on the feet.    Lab Results  Component Value Date   CREATININE 0.81 06/09/2021   BUN 16 06/09/2021   NA 142 06/09/2021   K 4.5 06/09/2021   CL 105 06/09/2021   CO2 27 06/09/2021    A1c=6.7%    Assessment & Plan:  Insulin-requiring type 2 DM Hypoglycemia, due to insulin: we'll favor GLP rx  Patient Instructions  I have sent a prescription to your pharmacy, to increase the [facility], and: Reduce the Lantus to 20 units daily, and: Please continue the same other diabetes medications.   check your  blood sugar twice a day.  vary the time of day when you check, between before the 3 meals, and at bedtime.  also check if you have symptoms of your blood sugar being too high or too low.  please keep a record of the readings and bring it to your next appointment here (or you can bring the meter itself).  You can write it on any piece of paper.  please call us sooner if your blood sugar goes  below 70, or if you have a lot of readings over 200.   Please come back for a follow-up appointment in 2 months.

## 2021-09-26 NOTE — Patient Instructions (Addendum)
I have sent a prescription to your pharmacy, to increase the [facility], and: Reduce the Lantus to 20 units daily, and: Please continue the same other diabetes medications.   check your blood sugar twice a day.  vary the time of day when you check, between before the 3 meals, and at bedtime.  also check if you have symptoms of your blood sugar being too high or too low.  please keep a record of the readings and bring it to your next appointment here (or you can bring the meter itself).  You can write it on any piece of paper.  please call us sooner if your blood sugar goes below 70, or if you have a lot of readings over 200.   Please come back for a follow-up appointment in 2 months.

## 2021-09-27 ENCOUNTER — Ambulatory Visit (HOSPITAL_COMMUNITY)
Admission: RE | Admit: 2021-09-27 | Discharge: 2021-09-27 | Disposition: A | Discharge status: Home / Self Care | Payer: Self-pay | Source: Ambulatory Visit | Attending: Family Medicine | Admitting: Family Medicine

## 2021-09-27 DIAGNOSIS — E7800 Pure hypercholesterolemia, unspecified: Secondary | ICD-10-CM | POA: Insufficient documentation

## 2021-11-27 ENCOUNTER — Other Ambulatory Visit: Payer: Self-pay | Admitting: Endocrinology

## 2021-11-27 DIAGNOSIS — E119 Type 2 diabetes mellitus without complications: Secondary | ICD-10-CM

## 2021-11-29 ENCOUNTER — Encounter: Payer: Self-pay | Admitting: Endocrinology

## 2021-11-29 ENCOUNTER — Ambulatory Visit (INDEPENDENT_AMBULATORY_CARE_PROVIDER_SITE_OTHER): Payer: Medicare Other | Admitting: Endocrinology

## 2021-11-29 VITALS — BP 148/82 | HR 57 | Ht 67.0 in | Wt 235.2 lb

## 2021-11-29 DIAGNOSIS — Z794 Long term (current) use of insulin: Secondary | ICD-10-CM | POA: Diagnosis not present

## 2021-11-29 DIAGNOSIS — E119 Type 2 diabetes mellitus without complications: Secondary | ICD-10-CM | POA: Diagnosis not present

## 2021-11-29 MED ORDER — LANTUS SOLOSTAR 100 UNIT/ML ~~LOC~~ SOPN: 15.0000 [IU] | PEN_INJECTOR | Freq: Every day | SUBCUTANEOUS | 1 refills | Status: DC

## 2021-11-29 NOTE — Progress Notes (Signed)
? ?Subjective:  ? ? Patient ID: [name], female    DOB: Apr 26, 1956, 66 y.o.   MRN: [id number] ? ?HPI ?Pt returns for f/u of diabetes mellitus: ?DM type: Insulin-requiring type 2.   ?Dx'ed: 2016 ?Complications: none ?Therapy: insulin since soon after dx (now V-GO-30), 3 oral meds, and Mounjaro.   ?GDM: never (G0) ?DKA: never ?Severe hypoglycemia: never.   ?Pancreatitis: never.  ?Other: med dosages (esp Trulicity) are limited by nausea; she changed V-GO to multiple daily injections, due to cost.   ?Interval history: no cbg record, but states cbg's varies from 75-140. She has mild hypoglycemia approx QOD.  This happens if lunch is delayed.   ?Past Medical History:  ?Diagnosis Date  ? Adenocarcinoma of right lung, stage 1 ([place]) 11/29/2016  ? Cancer [facility])   ? Chronic pain   ? Depression   ? takes Lexapro daily  ? Diabetes mellitus   ? takes Metformin daily and has an insulin pump.Fasting blood sugar runs250  ? GERD (gastroesophageal reflux disease)   ? takes Pantoprazole daily  ? History of colon polyps   ? benign  ? Hyperlipidemia   ? takes Atorvastatin daily  ? Hypertension   ? takes Amlodipine and Lisinopril daily  ? Insomnia   ? takes Trazodone nightly  ? Nocturia   ? Thyroid disease   ? Urinary frequency   ? Urinary urgency   ? ? ?Past Surgical History:  ?Procedure Laterality Date  ? ABDOMINAL HYSTERECTOMY    ? ABDOMINAL SURGERY    ? BIOPSY  05/28/2019  ? Procedure: BIOPSY;  Surgeon: Danie Binder, MD;  [facility];  Service: Endoscopy;;  random colon/gastric/duodenum  ? COLONOSCOPY  2005 MAC  ? TICs, IH  ? COLONOSCOPY N/A 03/08/2014  ? Procedure: COLONOSCOPY;  Surgeon: Danie Binder, MD;  [facility];  Service: Endoscopy;  Laterality: N/A;  1015  ? COLONOSCOPY WITH PROPOFOL N/A 05/28/2019  ? Procedure: COLONOSCOPY WITH PROPOFOL;  Surgeon: Danie Binder, MD;  [facility];  Service: Endoscopy;  Laterality: N/A;  9:15am  ? CYSTOSTOMY  11/22/2011  ? Procedure: CYSTOSTOMY  SUPRAPUBIC;  Surgeon: Marissa Nestle, MD;  [facility]: [facility];  Service: Urology;  Laterality: N/A;  ? ESOPHAGOGASTRODUODENOSCOPY N/A 03/08/2014  ? Procedure: ESOPHAGOGASTRODUODENOSCOPY (EGD);  Surgeon: Danie Binder, MD;  [facility];  Service: Endoscopy;  Laterality: N/A;  ? ESOPHAGOGASTRODUODENOSCOPY    ? ESOPHAGOGASTRODUODENOSCOPY (EGD) WITH PROPOFOL N/A 05/28/2019  ? Procedure: ESOPHAGOGASTRODUODENOSCOPY (EGD) WITH PROPOFOL;  Surgeon: Danie Binder, MD;  [facility];  Service: Endoscopy;  Laterality: N/A;  ? GIVENS CAPSULE STUDY N/A 06/15/2019  ? Procedure: GIVENS CAPSULE STUDY;  Surgeon: Danie Binder, MD;  [facility];  Service: Endoscopy;  Laterality: N/A;  7:30am  ? HALLUX VALGUS CORRECTION    ? bilateral foot surgery for bone repairs-multiple  ? LOBECTOMY Right 10/15/2016  ? Procedure: RIGHT UPPER LOBECTOMY;  Surgeon: [name], MD;  Location: [place];  Service: Thoracic;  Laterality: Right;  ? LYMPH NODE DISSECTION Right 10/15/2016  ? Procedure: LYMPH NODE DISSECTION;  Surgeon: [name], MD;  Location: [place];  Service: Thoracic;  Laterality: Right;  ? POLYPECTOMY  05/28/2019  ? Procedure: POLYPECTOMY;  Surgeon: Danie Binder, MD;  [facility];  Service: Endoscopy;;  ? SAVORY DILATION N/A 05/28/2019  ? Procedure: SAVORY DILATION;  Surgeon: Danie Binder, MD;  [facility];  Service: Endoscopy;  Laterality: N/A;  15/16/17  ? VAGINA RECONSTRUCTION SURGERY    ? tvt 2006- [facility]  ? VIDEO ASSISTED THORACOSCOPY (VATS)/WEDGE RESECTION Right 10/15/2016  ? Procedure: RIGHT VIDEO ASSISTED THORACOSCOPY (VATS)/WEDGE RESECTION;  Surgeon: [name], MD;  Location: [place];  Service: Thoracic;  Laterality: Right;  ? ? ?Social History  ? ?Socioeconomic History  ? Marital status: Single  ?  Spouse name: Not on file  ? Number of children: Not on file  ? Years of education: Not on file  ? Highest education level: Not on  file  ?Occupational History  ? Not on file  ?Tobacco Use  ? Smoking status: Former  ?  Packs/day: 1.00  ?  Years: 32.00  ?  Pack years: 32.00  ?  Types: Cigarettes  ? Smokeless tobacco: Never  ? Tobacco comments:  ?  quit smoking 20 yrs ago  ?Vaping Use  ? Vaping Use: Never used  ?Substance and Sexual Activity  ? Alcohol use: No  ?  Comment: rarely  ? Drug use: No  ? Sexual activity: Not on file  ?Other Topics Concern  ? Not on file  ?Social History Narrative  ? Not on file  ? ?Social Determinants of Health  ? ?Financial Resource Strain: Not on file  ?Food Insecurity: Not on file  ?Transportation Needs: Not on file  ?Physical Activity: Not on file  ?Stress: Not on file  ?Social Connections: Not on file  ?Intimate Partner Violence: Not on file  ? ? ?Current Outpatient Medications on File Prior to Visit  ?Medication Sig Dispense Refill  ? ACCU-CHEK AVIVA PLUS test strip USE TO MONITOR GLUCOSE LEVELS TWICE A DAY E11.9 100 strip 12  ? ACCU-CHEK SOFTCLIX LANCETS lancets Use to monitor glucose levels BID; E11.9 (Patient taking differently: 1 each by Other route 2 (two) times daily. E11.9) 100 each 12  ? acetaminophen (TYLENOL) 500 MG tablet Take 1 tablet (500 mg total) by mouth every 6 (six) hours as needed. (Patient taking differently: Take 500 mg by mouth every 6 (six) hours as needed (for pain).) 30 tablet 0  ? atorvastatin (LIPITOR) 20 MG tablet Take 20 mg by mouth daily.  11  ? benzonatate (TESSALON) 100 MG capsule Take 1 capsule (100 mg total) by mouth every 8 (eight) hours. 30 capsule 0  ? Blood Glucose Calibration (GLUCOSE CONTROL) SOLN 1 Bottle by In Vitro route as needed. Use to calibrate Accu-Chek Aviva Plus device prn; please provide control solution compatible with Accu-Chek Aviva Plus device. (Patient taking differently: 1 each by Other route as needed (Use to calibrate glucometer). E11.9) 1 each 1  ? Blood Glucose Monitoring Suppl (ACCU-CHEK AVIVA PLUS) w/Device KIT 1 each by Does not apply route 2 (two)  times daily. Use to monitor glucose levels BID; E11.9 (Patient taking differently: 1 each by Does not apply route 2 (two) times daily. E11.9) 1 kit 0  ? bromocriptine (PARLODEL) 2.5 MG tablet TAKE 1 TABLET BY MOUTH EVERY DAY 30 tablet 11  ? cetirizine (ZYRTEC ALLERGY) 10 MG tablet Take 1 tablet (10 mg total) by mouth daily. 30 tablet 0  ? chlorthalidone (HYGROTON) 25 MG tablet Take 12.5 mg by mouth daily.     ? escitalopram (LEXAPRO) 10 MG tablet Take 1 tablet (10 mg total) by mouth every morning. 90 tablet 3  ? FARXIGA 10 MG TABS tablet TAKE 1 TABLET BY MOUTH EVERY DAY 30 tablet 11  ? insulin aspart (NOVOLOG FLEXPEN) 100 UNIT/ML FlexPen Inject 10 Units into  the skin 3 (three) times daily with meals. 15 mL 11  ? Insulin Pen Needle (PEN NEEDLES) 31G X 8 MM MISC Use As Directed 100 each 5  ? lisinopril (ZESTRIL) 10 MG tablet Take 10 mg by mouth daily.    ? metFORMIN (GLUCOPHAGE-XR) 500 MG 24 hr tablet TAKE 1 TABLET BY MOUTH 2 TIMES DAILY. 60 tablet 11  ? pantoprazole (PROTONIX) 40 MG tablet 1 PO 30 MINS BEFORE YOUR FIRST AND LAST MEAL. 60 tablet 5  ? promethazine (PHENERGAN) 12.5 MG tablet 1-2 po q4-6 h prn nausea or vomiting 30 tablet 1  ? tirzepatide (MOUNJARO) 10 MG/0.5ML Pen Inject 10 mg into the skin once a week. 6 mL 3  ? traZODone (DESYREL) 100 MG tablet Take 2 tablets (200 mg total) by mouth at bedtime. 180 tablet 3  ? triamcinolone cream (KENALOG) 0.1 % Apply 1 application topically 2 (two) times daily. 30 g 0  ? YUVAFEM 10 MCG TABS vaginal tablet Place 10 mcg vaginally See admin instructions. Twice a week as needed for vaginal dryness  11  ? fluticasone (FLONASE) 50 MCG/ACT nasal spray Place 1 spray into both nostrils daily for 14 days. 16 g 0  ? ?No current facility-administered medications on file prior to visit.  ? ? ?Allergies  ?Allergen Reactions  ? Synthroid [Levothyroxine Sodium] Anaphylaxis  ? Hydrochlorothiazide   ?  Other reaction(s): cramps  ? Iodine Rash  ?  I get a skin rash sometimes if iodine  applied to my skin.  ?  ? Povidone-Iodine Itching and Rash  ? ? ?Family History  ?Problem Relation Age of Onset  ? Asthma Other   ? Diabetes Other   ? Arthritis Other   ? Cancer Mother   ? Cancer Brother   ?

## 2021-11-29 NOTE — Patient Instructions (Addendum)
Please reduce the Lantus to 15 units daily, and: ?Please continue the same other diabetes medications.   ?check your blood sugar twice a day.  vary the time of day when you check, between before the 3 meals, and at bedtime.  also check if you have symptoms of your blood sugar being too high or too low.  please keep a record of the readings and bring it to your next appointment here (or you can bring the meter itself).  You can write it on any piece of paper.  please call us sooner if your blood sugar goes below 70, or if you have a lot of readings over 200.   ?You should have an endocrinology follow-up appointment in 4 months.  ? ?

## 2022-03-12 ENCOUNTER — Telehealth (INDEPENDENT_AMBULATORY_CARE_PROVIDER_SITE_OTHER): Payer: Medicare Other | Admitting: Psychiatry

## 2022-03-12 ENCOUNTER — Encounter (HOSPITAL_COMMUNITY): Payer: Self-pay | Admitting: Psychiatry

## 2022-03-12 DIAGNOSIS — F331 Major depressive disorder, recurrent, moderate: Secondary | ICD-10-CM | POA: Diagnosis not present

## 2022-03-12 MED ORDER — ESCITALOPRAM OXALATE 10 MG PO TABS: 10.0000 mg | ORAL_TABLET | Freq: Every morning | ORAL | 3 refills | Status: DC

## 2022-03-12 MED ORDER — TRAZODONE HCL 100 MG PO TABS: 200.0000 mg | ORAL_TABLET | Freq: Every day | ORAL | 3 refills | Status: DC

## 2022-03-12 NOTE — Progress Notes (Signed)
Virtual Visit via Video Note  I connected with [name] on 03/12/22 at  2:00 PM EDT by a video enabled telemedicine application and verified that I am speaking with the correct person using two identifiers.  Location: Patient: home Provider: office   I discussed the limitations of evaluation and management by telemedicine and the availability of in person appointments. The patient expressed understanding and agreed to proceed.     I discussed the assessment and treatment plan with the patient. The patient was provided an opportunity to ask questions and all were answered. The patient agreed with the plan and demonstrated an understanding of the instructions.   The patient was advised to call back or seek an in-person evaluation if the symptoms worsen or if the condition fails to improve as anticipated.  I provided 12 minutes of non-face-to-face time during this encounter.   Molly Spiller, MD  [facility] MD/PA/NP OP Progress Note  03/12/2022 1:55 PM MADDELYN Hodge  MRN:  [id number]  Chief Complaint:  Chief Complaint  Patient presents with   Depression   Follow-up   HPI: This patient is a 66 year old single white female who lives alone in [place].  She is retired as a Recruitment consultant.  She is originally from [facility].  Patient follow-up after 6 months.  She states that she continues to do well.  She went to [facility] to visit friends last month and enjoyed herself.  She did not talk to any family as they were rather rejecting last time she talked with them.  She states that her mood is good.  She denies significant depression anxiety difficulty sleeping nightmares or flashbacks. Visit Diagnosis:    ICD-10-CM   1. Major depressive disorder, recurrent episode, moderate (HCC)  F33.1       Past Psychiatric History: none  Past Medical History:  Past Medical History:  Diagnosis Date   Adenocarcinoma of right lung, stage 1 (HCC) 11/29/2016   Cancer (HCC)     Chronic pain    Depression    takes Lexapro daily   Diabetes mellitus    takes Metformin daily and has an insulin pump.Fasting blood sugar runs250   GERD (gastroesophageal reflux disease)    takes Pantoprazole daily   History of colon polyps    benign   Hyperlipidemia    takes Atorvastatin daily   Hypertension    takes Amlodipine and Lisinopril daily   Insomnia    takes Trazodone nightly   Nocturia    Thyroid disease    Urinary frequency    Urinary urgency     Past Surgical History:  Procedure Laterality Date   ABDOMINAL HYSTERECTOMY     ABDOMINAL SURGERY     BIOPSY  05/28/2019   Procedure: BIOPSY;  Surgeon: Danie Binder, MD;  [facility];  Service: Endoscopy;;  random colon/gastric/duodenum   COLONOSCOPY  2005 MAC   TICs, IH   COLONOSCOPY N/A 03/08/2014   Procedure: COLONOSCOPY;  Surgeon: Danie Binder, MD;  [facility];  Service: Endoscopy;  Laterality: N/A;  1015   COLONOSCOPY WITH PROPOFOL N/A 05/28/2019   Procedure: COLONOSCOPY WITH PROPOFOL;  Surgeon: Danie Binder, MD;  [facility];  Service: Endoscopy;  Laterality: N/A;  9:15am   CYSTOSTOMY  11/22/2011   Procedure: CYSTOSTOMY SUPRAPUBIC;  Surgeon: Marissa Nestle, MD;  Location: [facility];  Service: Urology;  Laterality: N/A;   ESOPHAGOGASTRODUODENOSCOPY N/A 03/08/2014   Procedure: ESOPHAGOGASTRODUODENOSCOPY (EGD);  Surgeon: Danie Binder,  MD;  [facility];  Service: Endoscopy;  Laterality: N/A;   ESOPHAGOGASTRODUODENOSCOPY     ESOPHAGOGASTRODUODENOSCOPY (EGD) WITH PROPOFOL N/A 05/28/2019   Procedure: ESOPHAGOGASTRODUODENOSCOPY (EGD) WITH PROPOFOL;  Surgeon: Danie Binder, MD;  [facility];  Service: Endoscopy;  Laterality: N/A;   GIVENS CAPSULE STUDY N/A 06/15/2019   Procedure: GIVENS CAPSULE STUDY;  Surgeon: Danie Binder, MD;  [facility];  Service: Endoscopy;  Laterality: N/A;  7:30am   HALLUX VALGUS CORRECTION     bilateral foot  surgery for bone repairs-multiple   LOBECTOMY Right 10/15/2016   Procedure: RIGHT UPPER LOBECTOMY;  Surgeon: [name], MD;  Location: [place];  Service: Thoracic;  Laterality: Right;   LYMPH NODE DISSECTION Right 10/15/2016   Procedure: LYMPH NODE DISSECTION;  Surgeon: [name], MD;  Location: [place];  Service: Thoracic;  Laterality: Right;   POLYPECTOMY  05/28/2019   Procedure: POLYPECTOMY;  Surgeon: Danie Binder, MD;  [facility];  Service: Endoscopy;;   SAVORY DILATION N/A 05/28/2019   Procedure: SAVORY DILATION;  Surgeon: Danie Binder, MD;  [facility];  Service: Endoscopy;  Laterality: N/A;  15/16/17   VAGINA RECONSTRUCTION SURGERY     tvt 2006- [place] (VATS)/WEDGE RESECTION Right 10/15/2016   Procedure: RIGHT VIDEO ASSISTED THORACOSCOPY (VATS)/WEDGE RESECTION;  Surgeon: [name], MD;  Location: [place];  Service: Thoracic;  Laterality: Right;    Family Psychiatric History: see below  Family History:  Family History  Problem Relation Age of Onset   Asthma Other    Diabetes Other    Arthritis Other    Cancer Mother    Cancer Brother    Anesthesia problems Neg Hx    Hypotension Neg Hx    Malignant hyperthermia Neg Hx    Pseudochol deficiency Neg Hx    Colon polyps Neg Hx    Colon cancer Neg Hx    Celiac disease Neg Hx    Pancreatic cancer Neg Hx    Stomach cancer Neg Hx    Ulcerative colitis Neg Hx    Crohn's disease Neg Hx     Social History:  Social History   Socioeconomic History   Marital status: Single    Spouse name: Not on file   Number of children: Not on file   Years of education: Not on file   Highest education level: Not on file  Occupational History   Not on file  Tobacco Use   Smoking status: Former    Packs/day: 1.00    Years: 32.00    Total pack years: 32.00    Types: Cigarettes   Smokeless tobacco: Never   Tobacco comments:    quit smoking 20 yrs  ago  Vaping Use   Vaping Use: Never used  Substance and Sexual Activity   Alcohol use: No    Comment: rarely   Drug use: No   Sexual activity: Not on file  Other Topics Concern   Not on file  Social History Narrative   Not on file   Social Determinants of Health   Financial Resource Strain: Not on file  Food Insecurity: Not on file  Transportation Needs: Not on file  Physical Activity: Not on file  Stress: Not on file  Social Connections: Not on file    Allergies:  Allergies  Allergen Reactions   Synthroid [Levothyroxine Sodium] Anaphylaxis   Hydrochlorothiazide     Other reaction(s):  cramps   Iodine Rash    I get a skin rash sometimes if iodine applied to my skin.     Povidone-Iodine Itching and Rash    Metabolic Disorder Labs: Lab Results  Component Value Date   HGBA1C 6.7 (A) 09/26/2021   MPG 200 10/11/2016   No results found for: "PROLACTIN" No results found for: "CHOL", "TRIG", "HDL", "CHOLHDL", "VLDL", "LDLCALC" Lab Results  Component Value Date   TSH 2.93 07/11/2016    Therapeutic Level Labs: No results found for: "LITHIUM" No results found for: "VALPROATE" No results found for: "CBMZ"  Current Medications: Current Outpatient Medications  Medication Sig Dispense Refill   ACCU-CHEK AVIVA PLUS test strip USE TO MONITOR GLUCOSE LEVELS TWICE A DAY E11.9 100 strip 12   ACCU-CHEK SOFTCLIX LANCETS lancets Use to monitor glucose levels BID; E11.9 (Patient taking differently: 1 each by Other route 2 (two) times daily. E11.9) 100 each 12   acetaminophen (TYLENOL) 500 MG tablet Take 1 tablet (500 mg total) by mouth every 6 (six) hours as needed. (Patient taking differently: Take 500 mg by mouth every 6 (six) hours as needed (for pain).) 30 tablet 0   atorvastatin (LIPITOR) 20 MG tablet Take 20 mg by mouth daily.  11   benzonatate (TESSALON) 100 MG capsule Take 1 capsule (100 mg total) by mouth every 8 (eight) hours. 30 capsule 0   Blood Glucose Calibration  (GLUCOSE CONTROL) SOLN 1 Bottle by In Vitro route as needed. Use to calibrate Accu-Chek Aviva Plus device prn; please provide control solution compatible with Accu-Chek Aviva Plus device. (Patient taking differently: 1 each by Other route as needed (Use to calibrate glucometer). E11.9) 1 each 1   Blood Glucose Monitoring Suppl (ACCU-CHEK AVIVA PLUS) w/Device KIT 1 each by Does not apply route 2 (two) times daily. Use to monitor glucose levels BID; E11.9 (Patient taking differently: 1 each by Does not apply route 2 (two) times daily. E11.9) 1 kit 0   bromocriptine (PARLODEL) 2.5 MG tablet TAKE 1 TABLET BY MOUTH EVERY DAY 30 tablet 11   cetirizine (ZYRTEC ALLERGY) 10 MG tablet Take 1 tablet (10 mg total) by mouth daily. 30 tablet 0   chlorthalidone (HYGROTON) 25 MG tablet Take 12.5 mg by mouth daily.      escitalopram (LEXAPRO) 10 MG tablet Take 1 tablet (10 mg total) by mouth every morning. 90 tablet 3   FARXIGA 10 MG TABS tablet TAKE 1 TABLET BY MOUTH EVERY DAY 30 tablet 11   fluticasone (FLONASE) 50 MCG/ACT nasal spray Place 1 spray into both nostrils daily for 14 days. 16 g 0   insulin aspart (NOVOLOG FLEXPEN) 100 UNIT/ML FlexPen Inject 10 Units into the skin 3 (three) times daily with meals. 15 mL 11   insulin glargine (LANTUS SOLOSTAR) 100 UNIT/ML Solostar Pen Inject 15 Units into the skin daily. 15 mL 1   Insulin Pen Needle (PEN NEEDLES) 31G X 8 MM MISC Use As Directed 100 each 5   lisinopril (ZESTRIL) 10 MG tablet Take 10 mg by mouth daily.     metFORMIN (GLUCOPHAGE-XR) 500 MG 24 hr tablet TAKE 1 TABLET BY MOUTH 2 TIMES DAILY. 60 tablet 11   pantoprazole (PROTONIX) 40 MG tablet 1 PO 30 MINS BEFORE YOUR FIRST AND LAST MEAL. 60 tablet 5   promethazine (PHENERGAN) 12.5 MG tablet 1-2 po q4-6 h prn nausea or vomiting 30 tablet 1   tirzepatide (MOUNJARO) 10 MG/0.5ML Pen Inject 10 mg into the skin once a week. 6 mL  3   traZODone (DESYREL) 100 MG tablet Take 2 tablets (200 mg total) by mouth at  bedtime. 180 tablet 3   triamcinolone cream (KENALOG) 0.1 % Apply 1 application topically 2 (two) times daily. 30 g 0   YUVAFEM 10 MCG TABS vaginal tablet Place 10 mcg vaginally See admin instructions. Twice a week as needed for vaginal dryness  11   No current facility-administered medications for this visit.     Musculoskeletal: Strength & Muscle Tone: within normal limits Gait & Station: normal Patient leans: N/A  Psychiatric Specialty Exam: Review of Systems  All other systems reviewed and are negative.   There were no vitals taken for this visit.There is no height or weight on file to calculate BMI.  General Appearance: Casual and Fairly Groomed  Eye Contact:  Good  Speech:  Clear and Coherent  Volume:  Normal  Mood:  Euthymic  Affect:  Appropriate and Congruent  Thought Process:  Goal Directed  Orientation:  Full (Time, Place, and Person)  Thought Content: WDL   Suicidal Thoughts:  No  Homicidal Thoughts:  No  Memory:  Immediate;   Good Recent;   Good Remote;   Good  Judgement:  Good  Insight:  Good  Psychomotor Activity:  Normal  Concentration:  Concentration: Good and Attention Span: Good  Recall:  Good  Fund of Knowledge: Good  Language: Good  Akathisia:  No  Handed:  Right  AIMS (if indicated): not done  Assets:  Communication Skills Desire for Improvement Physical Health Resilience Social Support Talents/Skills  ADL's:  Intact  Cognition: WNL  Sleep:  Good   Screenings: PHQ2-9    Flowsheet Row Video Visit from 03/12/2022 in [place] Video Visit from 09/14/2021 in [place] [facility] Video Visit from 04/21/2021 in [place] [facility] Video Visit from 10/05/2020 in [place] [facility]  PHQ-2 Total Score 0 0 0 0      Flowsheet Row Video Visit from 03/12/2022 in [place]  [facility] Video Visit from 09/14/2021 in [place] ED from 08/02/2021 in [place] No Risk No Risk No Risk        Assessment and Plan: This patient is a 66 year old female with a history of depression and anxiety.  She continues to do well on her current regimen.  She will continue Lexapro 10 mg daily for depression and trazodone 200 mg at bedtime for sleep.  She will return to see me in 6 months  Collaboration of Care: Collaboration of Care: Primary Care Provider AEB notes will be shared with PCP at patient request  Patient/Guardian was advised Release of Information must be obtained prior to any record release in order to collaborate their care with an outside provider. Patient/Guardian was advised if they have not already done so to contact the registration department to sign all necessary forms in order for [place] to release information regarding their care.   Consent: Patient/Guardian gives verbal consent for treatment and assignment of benefits for services provided during this visit. Patient/Guardian expressed understanding and agreed to proceed.    Molly Spiller, MD 03/12/2022, 1:55 PM

## 2022-04-03 ENCOUNTER — Ambulatory Visit: Payer: Self-pay | Admitting: Nurse Practitioner

## 2022-04-03 ENCOUNTER — Ambulatory Visit: Payer: Medicare Other | Admitting: Internal Medicine

## 2022-04-16 NOTE — Patient Instructions (Signed)

## 2022-04-17 ENCOUNTER — Ambulatory Visit (INDEPENDENT_AMBULATORY_CARE_PROVIDER_SITE_OTHER): Payer: Medicare Other | Admitting: Nurse Practitioner

## 2022-04-17 ENCOUNTER — Encounter: Payer: Self-pay | Admitting: Nurse Practitioner

## 2022-04-17 VITALS — BP 148/82 | HR 80 | Ht 66.5 in | Wt 230.8 lb

## 2022-04-17 DIAGNOSIS — Z794 Long term (current) use of insulin: Secondary | ICD-10-CM

## 2022-04-17 DIAGNOSIS — E119 Type 2 diabetes mellitus without complications: Secondary | ICD-10-CM | POA: Diagnosis not present

## 2022-04-17 DIAGNOSIS — E782 Mixed hyperlipidemia: Secondary | ICD-10-CM | POA: Diagnosis not present

## 2022-04-17 DIAGNOSIS — I10 Essential (primary) hypertension: Secondary | ICD-10-CM | POA: Diagnosis not present

## 2022-04-17 MED ORDER — METFORMIN HCL ER 500 MG PO TB24: 500.0000 mg | ORAL_TABLET | Freq: Every day | ORAL | 3 refills | Status: DC

## 2022-04-17 MED ORDER — NOVOLOG FLEXPEN 100 UNIT/ML ~~LOC~~ SOPN: 4.0000 [IU] | PEN_INJECTOR | Freq: Three times a day (TID) | SUBCUTANEOUS | 3 refills | Status: DC

## 2022-04-17 MED ORDER — LANTUS SOLOSTAR 100 UNIT/ML ~~LOC~~ SOPN: 20.0000 [IU] | PEN_INJECTOR | Freq: Every day | SUBCUTANEOUS | 3 refills | Status: DC

## 2022-04-17 MED ORDER — TIRZEPATIDE 10 MG/0.5ML ~~LOC~~ SOAJ: 10.0000 mg | SUBCUTANEOUS | 3 refills | Status: DC

## 2022-04-17 NOTE — Progress Notes (Signed)
Endocrinology Consult Note       04/17/2022, 4:45 PM   Subjective:    Patient ID: [name], female    DOB: 11/04/1955.  Salvadore Oxford is being seen in consultation for management of currently uncontrolled symptomatic diabetes requested by  Lujean Amel, MD.   Past Medical History:  Diagnosis Date   Adenocarcinoma of right lung, stage 1 ([place]) 11/29/2016   Cancer ([place])    Chronic pain    Depression    takes Lexapro daily   Diabetes mellitus    takes Metformin daily and has an insulin pump.Fasting blood sugar runs250   GERD (gastroesophageal reflux disease)    takes Pantoprazole daily   History of colon polyps    benign   Hyperlipidemia    takes Atorvastatin daily   Hypertension    takes Amlodipine and Lisinopril daily   Insomnia    takes Trazodone nightly   Nocturia    Thyroid disease    Urinary frequency    Urinary urgency     Past Surgical History:  Procedure Laterality Date   ABDOMINAL HYSTERECTOMY     ABDOMINAL SURGERY     BIOPSY  05/28/2019   Procedure: BIOPSY;  Surgeon: Danie Binder, MD;  [facility];  Service: Endoscopy;;  random colon/gastric/duodenum   COLONOSCOPY  2005 MAC   TICs, IH   COLONOSCOPY N/A 03/08/2014   Procedure: COLONOSCOPY;  Surgeon: Danie Binder, MD;  [facility];  Service: Endoscopy;  Laterality: N/A;  1015   COLONOSCOPY WITH PROPOFOL N/A 05/28/2019   Procedure: COLONOSCOPY WITH PROPOFOL;  Surgeon: Danie Binder, MD;  [facility];  Service: Endoscopy;  Laterality: N/A;  9:15am   CYSTOSTOMY  11/22/2011   Procedure: CYSTOSTOMY SUPRAPUBIC;  Surgeon: Marissa Nestle, MD;  Location: [facility];  Service: Urology;  Laterality: N/A;   ESOPHAGOGASTRODUODENOSCOPY N/A 03/08/2014   Procedure: ESOPHAGOGASTRODUODENOSCOPY (EGD);  Surgeon: Danie Binder, MD;  [facility];  Service: Endoscopy;  Laterality: N/A;    ESOPHAGOGASTRODUODENOSCOPY     ESOPHAGOGASTRODUODENOSCOPY (EGD) WITH PROPOFOL N/A 05/28/2019   Procedure: ESOPHAGOGASTRODUODENOSCOPY (EGD) WITH PROPOFOL;  Surgeon: Danie Binder, MD;  [facility];  Service: Endoscopy;  Laterality: N/A;   GIVENS CAPSULE STUDY N/A 06/15/2019   Procedure: GIVENS CAPSULE STUDY;  Surgeon: Danie Binder, MD;  [facility];  Service: Endoscopy;  Laterality: N/A;  7:30am   HALLUX VALGUS CORRECTION     bilateral foot surgery for bone repairs-multiple   LOBECTOMY Right 10/15/2016   Procedure: RIGHT UPPER LOBECTOMY;  Surgeon: [name], MD;  Location: [place];  Service: Thoracic;  Laterality: Right;   LYMPH NODE DISSECTION Right 10/15/2016   Procedure: LYMPH NODE DISSECTION;  Surgeon: [name], MD;  Location: [place];  Service: Thoracic;  Laterality: Right;   POLYPECTOMY  05/28/2019   Procedure: POLYPECTOMY;  Surgeon: Danie Binder, MD;  [facility];  Service: Endoscopy;;   SAVORY DILATION N/A 05/28/2019   Procedure: SAVORY DILATION;  Surgeon: Danie Binder, MD;  [facility];  Service: Endoscopy;  Laterality: N/A;  15/16/17   VAGINA RECONSTRUCTION SURGERY  tvt 2006- [place] (VATS)/WEDGE RESECTION Right 10/15/2016   Procedure: RIGHT VIDEO ASSISTED THORACOSCOPY (VATS)/WEDGE RESECTION;  Surgeon: [name], MD;  [facility];  Service: Thoracic;  Laterality: Right;    Social History   Socioeconomic History   Marital status: Single    Spouse name: Not on file   Number of children: Not on file   Years of education: Not on file   Highest education level: Not on file  Occupational History   Not on file  Tobacco Use   Smoking status: Former    Packs/day: 1.00    Years: 32.00    Total pack years: 32.00    Types: Cigarettes   Smokeless tobacco: Never   Tobacco comments:    quit smoking 20 yrs ago  Vaping Use   Vaping Use: Never used   Substance and Sexual Activity   Alcohol use: No    Comment: rarely   Drug use: No   Sexual activity: Not on file  Other Topics Concern   Not on file  Social History Narrative   Not on file   Social Determinants of Health   Financial Resource Strain: Not on file  Food Insecurity: Not on file  Transportation Needs: Not on file  Physical Activity: Not on file  Stress: Not on file  Social Connections: Not on file    Family History  Problem Relation Age of Onset   Asthma Other    Diabetes Other    Arthritis Other    Cancer Mother    Cancer Brother    Anesthesia problems Neg Hx    Hypotension Neg Hx    Malignant hyperthermia Neg Hx    Pseudochol deficiency Neg Hx    Colon polyps Neg Hx    Colon cancer Neg Hx    Celiac disease Neg Hx    Pancreatic cancer Neg Hx    Stomach cancer Neg Hx    Ulcerative colitis Neg Hx    Crohn's disease Neg Hx     Outpatient Encounter Medications as of 04/17/2022  Medication Sig   ACCU-CHEK AVIVA PLUS test strip USE TO MONITOR GLUCOSE LEVELS TWICE A DAY E11.9   ACCU-CHEK SOFTCLIX LANCETS lancets Use to monitor glucose levels BID; E11.9 (Patient taking differently: 1 each by Other route 2 (two) times daily. E11.9)   acetaminophen (TYLENOL) 500 MG tablet Take 1 tablet (500 mg total) by mouth every 6 (six) hours as needed. (Patient taking differently: Take 500 mg by mouth every 6 (six) hours as needed (for pain).)   atorvastatin (LIPITOR) 20 MG tablet Take 20 mg by mouth daily.   benzonatate (TESSALON) 100 MG capsule Take 1 capsule (100 mg total) by mouth every 8 (eight) hours.   Blood Glucose Calibration (GLUCOSE CONTROL) SOLN 1 Bottle by In Vitro route as needed. Use to calibrate Accu-Chek Aviva Plus device prn; please provide control solution compatible with Accu-Chek Aviva Plus device. (Patient taking differently: 1 each by Other route as needed (Use to calibrate glucometer). E11.9)   Blood Glucose Monitoring Suppl (ACCU-CHEK AVIVA PLUS)  w/Device KIT 1 each by Does not apply route 2 (two) times daily. Use to monitor glucose levels BID; E11.9 (Patient taking differently: 1 each by Does not apply route 2 (two) times daily. E11.9)   cetirizine (ZYRTEC ALLERGY) 10 MG tablet Take 1 tablet (10 mg total) by mouth daily.   chlorthalidone (HYGROTON) 25 MG tablet Take 12.5 mg by mouth daily.    escitalopram (  LEXAPRO) 10 MG tablet Take 1 tablet (10 mg total) by mouth every morning.   Insulin Pen Needle (PEN NEEDLES) 31G X 8 MM MISC Use As Directed   lisinopril (ZESTRIL) 10 MG tablet Take 10 mg by mouth daily.   pantoprazole (PROTONIX) 40 MG tablet 1 PO 30 MINS BEFORE YOUR FIRST AND LAST MEAL.   promethazine (PHENERGAN) 12.5 MG tablet 1-2 po q4-6 h prn nausea or vomiting   traZODone (DESYREL) 100 MG tablet Take 2 tablets (200 mg total) by mouth at bedtime.   triamcinolone cream (KENALOG) 0.1 % Apply 1 application topically 2 (two) times daily.   YUVAFEM 10 MCG TABS vaginal tablet Place 10 mcg vaginally See admin instructions. Twice a week as needed for vaginal dryness   [DISCONTINUED] bromocriptine (PARLODEL) 2.5 MG tablet TAKE 1 TABLET BY MOUTH EVERY DAY   [DISCONTINUED] FARXIGA 10 MG TABS tablet TAKE 1 TABLET BY MOUTH EVERY DAY   [DISCONTINUED] insulin aspart (NOVOLOG FLEXPEN) 100 UNIT/ML FlexPen Inject 10 Units into the skin 3 (three) times daily with meals.   [DISCONTINUED] insulin glargine (LANTUS SOLOSTAR) 100 UNIT/ML Solostar Pen Inject 15 Units into the skin daily. (Patient taking differently: Inject 20 Units into the skin daily.)   [DISCONTINUED] metFORMIN (GLUCOPHAGE-XR) 500 MG 24 hr tablet TAKE 1 TABLET BY MOUTH 2 TIMES DAILY.   [DISCONTINUED] tirzepatide (MOUNJARO) 10 MG/0.5ML Pen Inject 10 mg into the skin once a week.   fluticasone (FLONASE) 50 MCG/ACT nasal spray Place 1 spray into both nostrils daily for 14 days.   insulin aspart (NOVOLOG FLEXPEN) 100 UNIT/ML FlexPen Inject 4-10 Units into the skin 3 (three) times daily with  meals.   insulin glargine (LANTUS SOLOSTAR) 100 UNIT/ML Solostar Pen Inject 20 Units into the skin daily.   metFORMIN (GLUCOPHAGE-XR) 500 MG 24 hr tablet Take 1 tablet (500 mg total) by mouth daily with breakfast.   tirzepatide (MOUNJARO) 10 MG/0.5ML Pen Inject 10 mg into the skin once a week.   No facility-administered encounter medications on file as of 04/17/2022.    ALLERGIES: Allergies  Allergen Reactions   Synthroid [Levothyroxine Sodium] Anaphylaxis   Hydrochlorothiazide     Other reaction(s): cramps   Iodine Rash    I get a skin rash sometimes if iodine applied to my skin.     Povidone-Iodine Itching and Rash    VACCINATION STATUS:  There is no immunization history on file for this patient.  Diabetes She presents for her initial diabetic visit. She has type 2 diabetes mellitus. Onset time: diagnosed at approx age of 89. Her disease course has been improving. Hypoglycemia symptoms include nervousness/anxiousness, sweats and tremors. There are no diabetic associated symptoms. There are no hypoglycemic complications. There are no diabetic complications. Risk factors for coronary artery disease include diabetes mellitus, obesity, hypertension and post-menopausal. Current diabetic treatment includes intensive insulin program and oral agent (triple therapy) (and Mounjaro). She is compliant with treatment all of the time. Her weight is fluctuating minimally. She is following a generally healthy diet. Meal planning includes avoidance of concentrated sweets. She has not had a previous visit with a dietitian. She participates in exercise daily. (She presents today for her consultation with no meter or logs to review.  Her POCT A1c today is 6.2%, improving from last A1c of 6.7%.  She is a former patient of Dr. Loanne Drilling.  She notes she monitors glucose 3 times daily.  She drinks water, coffee with artifical sugar, and SF tea, and typically eats 2 meals per day (skipping  breakfast most days).  She  walks 4 miles every day and is UTD on eye exam.  She does see a podiatrist.  She is currently on Parlodel 2.5 mg daily, Farxiga 10 mg daily, Metformin 500 mg ER daily (higher doses cause upset stomach), Mounjaro 10 mg SQ weekly, Lantus 20 units SQ nightly, and Novolog 10 units TID with meals.) An ACE inhibitor/angiotensin II receptor blocker is being taken. She sees a podiatrist.Eye exam is current.     Review of systems  Constitutional: + Minimally fluctuating body weight, current Body mass index is 36.69 kg/m., no fatigue, no subjective hyperthermia, no subjective hypothermia Eyes: no blurry vision, no xerophthalmia ENT: no sore throat, no nodules palpated in throat, no dysphagia/odynophagia, no hoarseness Cardiovascular: no chest pain, no shortness of breath, no palpitations, no leg swelling Respiratory: no cough, no shortness of breath Gastrointestinal: no nausea/vomiting/diarrhea Musculoskeletal: no muscle/joint aches Skin: no rashes, no hyperemia Neurological: no tremors, no numbness, no tingling, no dizziness Psychiatric: no depression, no anxiety  Objective:     BP (!) 148/82 (BP Location: Right Arm, Patient Position: Sitting, Cuff Size: Large)   Pulse 80   Ht 5' 6.5" (1.689 m)   Wt 230 lb 12.8 oz (104.7 kg)   BMI 36.69 kg/m   Wt Readings from Last 3 Encounters:  04/17/22 230 lb 12.8 oz (104.7 kg)  11/29/21 235 lb 3.2 oz (106.7 kg)  09/26/21 238 lb 3.2 oz (108 kg)     BP Readings from Last 3 Encounters:  04/17/22 (!) 148/82  11/29/21 (!) 148/82  09/26/21 130/78     Physical Exam- Limited  Constitutional:  Body mass index is 36.69 kg/m. , not in acute distress, normal state of mind Eyes:  EOMI, no exophthalmos Neck: Supple Cardiovascular: RRR, no murmurs, rubs, or gallops, no edema Respiratory: Adequate breathing efforts, no crackles, rales, rhonchi, or wheezing Musculoskeletal: no gross deformities, strength intact in all four extremities, no gross restriction  of joint movements Skin:  no rashes, no hyperemia Neurological: no tremor with outstretched hands    CMP ( most recent) CMP     Component Value Date/Time   NA 142 06/09/2021 0844   NA 141 05/29/2017 0921   K 4.5 06/09/2021 0844   K 4.8 05/29/2017 0921   CL 105 06/09/2021 0844   CO2 27 06/09/2021 0844   CO2 28 05/29/2017 0921   GLUCOSE 134 (H) 06/09/2021 0844   GLUCOSE 184 (H) 05/29/2017 0921   BUN 16 06/09/2021 0844   BUN 13.5 05/29/2017 0921   CREATININE 0.81 06/09/2021 0844   CREATININE 0.8 05/29/2017 0921   CALCIUM 9.2 06/09/2021 0844   CALCIUM 9.5 05/29/2017 0921   PROT 6.8 06/09/2021 0844   PROT 7.1 05/29/2017 0921   ALBUMIN 3.9 06/09/2021 0844   ALBUMIN 3.6 05/29/2017 0921   AST 16 06/09/2021 0844   AST 17 05/29/2017 0921   ALT 18 06/09/2021 0844   ALT 20 05/29/2017 0921   ALKPHOS 78 06/09/2021 0844   ALKPHOS 104 05/29/2017 0921   BILITOT 0.5 06/09/2021 0844   BILITOT 0.40 05/29/2017 0921   GFRNONAA >60 06/09/2021 0844   GFRAA >60 12/08/2019 0849     Diabetic Labs (most recent): Lab Results  Component Value Date   HGBA1C 6.7 (A) 09/26/2021   HGBA1C 7.1 (A) 06/26/2021   HGBA1C 6.9 (A) 02/23/2021     Lipid Panel ( most recent) Lipid Panel  No results found for: "CHOL", "TRIG", "HDL", "CHOLHDL", "VLDL", "LDLCALC", "LDLDIRECT", "LABVLDL"  Lab Results  Component Value Date   TSH 2.93 07/11/2016           Assessment & Plan:   1) Type 2 diabetes mellitus without complication, with long-term current use of insulin ([place])  She presents today for her consultation with no meter or logs to review.  Her POCT A1c today is 6.2%, improving from last A1c of 6.7%.  She is a former patient of Dr. Loanne Drilling.  She notes she monitors glucose 3 times daily.  She drinks water, coffee with artifical sugar, and SF tea, and typically eats 2 meals per day (skipping breakfast most days).  She walks 4 miles every day and is UTD on eye exam.  She does see a podiatrist.  She  is currently on Parlodel 2.5 mg daily, Farxiga 10 mg daily, Metformin 500 mg ER daily (higher doses cause upset stomach), Mounjaro 10 mg SQ weekly, Lantus 20 units SQ nightly, and Novolog 10 units TID with meals.  - Aslee C Cobos has currently uncontrolled symptomatic type 2 DM since 66 years of age, with most recent A1c of 6.2 %.   -Recent labs reviewed.  - I had a long discussion with her about the progressive nature of diabetes and the pathology behind its complications. -her diabetes is not currently complicated but she remains at a high risk for more acute and chronic complications which include CAD, CVA, CKD, retinopathy, and neuropathy. These are all discussed in detail with her.  The following Lifestyle Medicine recommendations according to [facility]) were discussed and offered to patient and she agrees to start the journey:  A. Whole Foods, Plant-based plate comprising of fruits and vegetables, plant-based proteins, whole-grain carbohydrates was discussed in detail with the patient.   A list for source of those nutrients were also provided to the patient.  Patient will use only water or unsweetened tea for hydration. B.  The need to stay away from risky substances including alcohol, smoking; obtaining 7 to 9 hours of restorative sleep, at least 150 minutes of moderate intensity exercise weekly, the importance of healthy social connections,  and stress reduction techniques were discussed. C.  A full color page of  Calorie density of various food groups per pound showing examples of each food groups was provided to the patient.  - I have counseled her on diet and weight management by adopting a carbohydrate restricted/protein rich diet. Patient is encouraged to switch to unprocessed or minimally processed complex starch and increased protein intake (animal or plant source), fruits, and vegetables. -  she is advised to stick to a routine mealtimes to eat 3  meals a day and avoid unnecessary snacks (to snack only to correct hypoglycemia).   - she acknowledges that there is a room for improvement in her food and drink choices. - Suggestion is made for her to avoid simple carbohydrates from her diet including Cakes, Sweet Desserts, Ice Cream, Soda (diet and regular), Sweet Tea, Candies, Chips, Cookies, Store Bought Juices, Alcohol in Excess of 1-2 drinks a day, Artificial Sweeteners, Coffee Creamer, and "Sugar-free" Products. This will help patient to have more stable blood glucose profile and potentially avoid unintended weight gain.  - I have approached her with the following individualized plan to manage her diabetes and patient agrees:   -She is advised to continue her Lantus 20 units SQ nightly and adjust her Novolog to 4-10 units TID with meals if glucose is above 90 and she is eating (Specific instructions on  how to titrate insulin dosage based on glucose readings given to patient in writing).  She demonstrated her ability to properly use the SSI chart with me today to dose her meal time insulin.  -she is encouraged to start monitoring glucose 4 times daily, before meals and before bed, to log their readings on the clinic sheets provided, and bring them to review at follow up appointment in 2 weeks.  She could benefit from CGM device, I sent order to [employer].  - she is warned not to take insulin without proper monitoring per orders. - Adjustment parameters are given to her for hypo and hyperglycemia in writing. - she is encouraged to call clinic for blood glucose levels less than 70 or above 300 mg /dl. - she is advised to continue Metformin 500 mg ER daily after breakfast (to help with GI side effects), and Mounjaro 10 mg SQ weekly, therapeutically suitable for patient . - her Parlodel and Wilder Glade will be discontinued, due to side effect profile and to deescalate her treatment plan.   - Specific targets for  A1c; LDL, HDL, and Triglycerides were  discussed with the patient.  2) Blood Pressure /Hypertension:  her blood pressure is controlled to target.   she is advised to continue her current medications including Lisinopril 10 mg p.o. daily with breakfast, Chlorthalidone 25 mg po daily.  3) Lipids/Hyperlipidemia:    There is no recent lipid panel available to review .  she is advised to continue Lipitor 20 mg daily at bedtime.  Side effects and precautions discussed with her.  4)  Weight/Diet:  her Body mass index is 36.69 kg/m.  -  clearly complicating her diabetes care.   she is a candidate for weight loss. I discussed with her the fact that loss of 5 - 10% of her  current body weight will have the most impact on her diabetes management.  Exercise, and detailed carbohydrates information provided  -  detailed on discharge instructions.  5) Chronic Care/Health Maintenance: -she is on ACEI/ARB and Statin medications and is encouraged to initiate and continue to follow up with Ophthalmology, Dentist, Podiatrist at least yearly or according to recommendations, and advised to stay away from smoking. I have recommended yearly flu vaccine and pneumonia vaccine at least every 5 years; moderate intensity exercise for up to 150 minutes weekly; and sleep for at least 7 hours a day.  - she is advised to maintain close follow up with Lujean Amel, MD for primary care needs, as well as her other providers for optimal and coordinated care.   - Time spent in this patient care: 60 min, of which > 50% was spent in counseling her about her diabetes and the rest reviewing her blood glucose logs, discussing her hypoglycemia and hyperglycemia episodes, reviewing her current and previous labs/studies (including abstraction from other facilities) and medications doses and developing a long term treatment plan based on the latest standards of care/guidelines; and documenting her care.    Please refer to Patient Instructions for Blood Glucose Monitoring and  Insulin/Medications Dosing Guide" in media tab for additional information. Please also refer to "Patient Self Inventory" in the Media tab for reviewed elements of pertinent patient history.  Salvadore Oxford participated in the discussions, expressed understanding, and voiced agreement with the above plans.  All questions were answered to her satisfaction. she is encouraged to contact clinic should she have any questions or concerns prior to her return visit.     Follow up plan: -  Return in about 1 month (around 05/17/2022) for Diabetes F/U, Bring meter and logs, No previsit labs.    Rayetta Pigg, [facility] [address] [place], [place] [address] Phone: [phone] Fax: [phone]  04/17/2022, 4:45 PM

## 2022-04-24 ENCOUNTER — Encounter: Payer: Self-pay | Admitting: Nurse Practitioner

## 2022-04-27 ENCOUNTER — Encounter: Payer: Self-pay | Admitting: Orthopedic Surgery

## 2022-04-27 ENCOUNTER — Ambulatory Visit (INDEPENDENT_AMBULATORY_CARE_PROVIDER_SITE_OTHER): Payer: Medicare Other | Admitting: Orthopedic Surgery

## 2022-04-27 ENCOUNTER — Ambulatory Visit (INDEPENDENT_AMBULATORY_CARE_PROVIDER_SITE_OTHER): Payer: Medicare Other

## 2022-04-27 VITALS — BP 148/76 | HR 63 | Ht 66.5 in | Wt 227.0 lb

## 2022-04-27 DIAGNOSIS — M5442 Lumbago with sciatica, left side: Secondary | ICD-10-CM | POA: Diagnosis not present

## 2022-04-27 MED ORDER — CYCLOBENZAPRINE HCL 10 MG PO TABS: 10.0000 mg | ORAL_TABLET | Freq: Two times a day (BID) | ORAL | 0 refills | Status: DC | PRN

## 2022-04-27 NOTE — Patient Instructions (Signed)
Ibuprofen 3 times a day for 7-10 days, and then as needed  PT for your back  If you continue to have issues, please call for a follow up appointment  Muscle relaxer for your back

## 2022-04-27 NOTE — Progress Notes (Signed)
Orthopaedic Clinic Return  Assessment: [name] is a 66 y.o. female with the following: Low back pain with left sided radicular pain   Plan: [name] had a stumble and near fall almost 2 months ago.  Since then she has had some pain in her lower back and into the left leg.  She has previously had issues with her lower back.  She prefers to avoid steroids due to her diabetes.  Recommend PT and flexeril.  She will follow up as needed.   Meds ordered this encounter  Medications   cyclobenzaprine (FLEXERIL) 10 MG tablet    Sig: Take 1 tablet (10 mg total) by mouth 2 (two) times daily as needed for muscle spasms.    Dispense:  20 tablet    Refill:  0    Body mass index is 36.09 kg/m.  Follow-up: Return if symptoms worsen or fail to improve.   Subjective:  Chief Complaint  Patient presents with   Back Pain    Pain down the left leg into the ankle since having a near fall DOI 03/02/22     History of Present Illness: [name] is a 66 y.o. female who returns to clinic for evaluation of low back pain.  She states she almost fell a couple of months ago, but as able to brace herself with a nearby wall.  Since then she has been having pain in her lower back and into her left leg.   Pain has not improved.  Occasional medicines.  No PT.  Has previously had issues with her lower back.     Review of Systems: No fevers or chills No numbness or tingling No chest pain No shortness of breath No bowel or bladder dysfunction No GI distress No headaches   Objective: BP (!) 148/76   Pulse 63   Ht 5' 6.5" (1.689 m)   Wt 227 lb (103 kg)   BMI 36.09 kg/m   Physical Exam:  Steady gait  Tenderness along lower back.  Negative straight leg raise.  Good strength BLE.  Equal patellar tendon reflexes.  Sensation intact in lower extremities.   IMAGING: I personally ordered and reviewed the following images:  Standing lumbar spine XR obtained in clinic today.  No acute  injuries.  Thoracolumbar scoliosis.  Disc height is maintained.  Anterior osteophytes.  Mild anterolisthesis L4-5.   Impression:  Lumbar spine XR with diffuse degenerative changes and mild anterolisthesis at L4-5   Mordecai Rasmussen, MD 04/27/2022 10:18 PM

## 2022-05-15 ENCOUNTER — Ambulatory Visit
Admission: EM | Admit: 2022-05-15 | Discharge: 2022-05-15 | Disposition: A | Discharge status: Home / Self Care | Payer: Medicare Other

## 2022-05-15 ENCOUNTER — Ambulatory Visit (INDEPENDENT_AMBULATORY_CARE_PROVIDER_SITE_OTHER): Payer: Medicare Other

## 2022-05-15 DIAGNOSIS — M25562 Pain in left knee: Secondary | ICD-10-CM | POA: Diagnosis not present

## 2022-05-15 MED ORDER — DICLOFENAC SODIUM 1 % EX GEL: 4.0000 g | Freq: Four times a day (QID) | CUTANEOUS | 0 refills | Status: DC

## 2022-05-15 NOTE — ED Triage Notes (Signed)
Pt reports left knee pain after she tripped, but did not fell. Pain is worse when walking, using stairs; left knee lees stiff when sitting for extended period of time. Ibuprofen and Flexeril gives no relief.    Pt reports she was seen by Orthopedics and was ruled out for sciatic pain.

## 2022-05-15 NOTE — ED Provider Notes (Signed)
[facility]    CSN: [id number] Arrival date & time: 05/15/22  1349      History   Chief Complaint Chief Complaint  Patient presents with   Knee Pain    HPI [name] is a 66 y.o. female.   The history is provided by the patient.   Patient presents for complaints of left knee pain that is been present for the past 2 months.  Patient states that she injured the knee when she was walking and almost fell, twisting the left knee.  Patient states since that time she has had worsening pain to the outside of the knee and to the lower portion of the knee.  She states that the knee often times feels stiff when she is sitting for prolonged periods of time.  Pain also worsens with prolonged walking and when using the stairs.  Patient states that the pain radiates down into the mid lower portion of her left lower leg.  She denies numbness, tingling, or inability to walk.  Patient states she has been taking ibuprofen and Flexeril with minimal relief.  Patient states she was seen by orthopedics and was told she had sciatic nerve pain.  Patient informs that she did have left meniscal surgery in 2020, she does not recall which part of the meniscus was repaired.  Past Medical History:  Diagnosis Date   Adenocarcinoma of right lung, stage 1 ([place]) 11/29/2016   Cancer (HCC)    Chronic pain    Depression    takes Lexapro daily   Diabetes mellitus    takes Metformin daily and has an insulin pump.Fasting blood sugar [id number]   GERD (gastroesophageal reflux disease)    takes Pantoprazole daily   History of colon polyps    benign   Hyperlipidemia    takes Atorvastatin daily   Hypertension    takes Amlodipine and Lisinopril daily   Insomnia    takes Trazodone nightly   Nocturia    Thyroid disease    Urinary frequency    Urinary urgency     Patient Active Problem List   Diagnosis Date Noted   Chronic pain 01/02/2021   Decreased estrogen level 01/02/2021   Exostosis of skull  01/02/2021   Insomnia 01/02/2021   Long term (current) use of insulin ([place]) 01/02/2021   Moderate recurrent major depression ([place]) 01/02/2021   Pure hypercholesterolemia 01/02/2021   Ventricular premature beats 01/02/2021   Nausea without vomiting 01/06/2020   Abdominal pain 01/06/2020   Obesity 07/15/2019   Diarrhea    History of colonic polyps 03/09/2019   Microcytic anemia 03/09/2019   Dysphagia 03/09/2019   Adenocarcinoma of right lung, stage 1 ([place]) 11/29/2016   S/P lobectomy of lung 10/15/2016   Hyperlipidemia 10/02/2016   Lung nodule 10/02/2016   Adrenal adenoma, right 10/02/2016   Diabetes ([place]) 06/16/2016   HTN (hypertension) 05/31/2016   Increased frequency of urination 07/16/2014   Change in bowel habits 03/03/2014   Depression 10/13/2013   Fractured lateral malleolus 08/12/2012   Left knee sprain 08/12/2012    Past Surgical History:  Procedure Laterality Date   ABDOMINAL HYSTERECTOMY     ABDOMINAL SURGERY     BIOPSY  05/28/2019   Procedure: BIOPSY;  Surgeon: Danie Binder, MD;  [facility];  Service: Endoscopy;;  random colon/gastric/duodenum   COLONOSCOPY  2005 MAC   TICs, IH   COLONOSCOPY N/A 03/08/2014   Procedure: COLONOSCOPY;  Surgeon: Danie Binder, MD;  [facility];  Service: Endoscopy;  Laterality: N/A;  1015   COLONOSCOPY WITH PROPOFOL N/A 05/28/2019   Procedure: COLONOSCOPY WITH PROPOFOL;  Surgeon: Danie Binder, MD;  [facility];  Service: Endoscopy;  Laterality: N/A;  9:15am   CYSTOSTOMY  11/22/2011   Procedure: CYSTOSTOMY SUPRAPUBIC;  Surgeon: Marissa Nestle, MD;  Location: [facility];  Service: Urology;  Laterality: N/A;   ESOPHAGOGASTRODUODENOSCOPY N/A 03/08/2014   Procedure: ESOPHAGOGASTRODUODENOSCOPY (EGD);  Surgeon: Danie Binder, MD;  [facility];  Service: Endoscopy;  Laterality: N/A;   ESOPHAGOGASTRODUODENOSCOPY     ESOPHAGOGASTRODUODENOSCOPY (EGD) WITH PROPOFOL N/A 05/28/2019   Procedure:  ESOPHAGOGASTRODUODENOSCOPY (EGD) WITH PROPOFOL;  Surgeon: Danie Binder, MD;  [facility];  Service: Endoscopy;  Laterality: N/A;   GIVENS CAPSULE STUDY N/A 06/15/2019   Procedure: GIVENS CAPSULE STUDY;  Surgeon: Danie Binder, MD;  [facility];  Service: Endoscopy;  Laterality: N/A;  7:30am   HALLUX VALGUS CORRECTION     bilateral foot surgery for bone repairs-multiple   LOBECTOMY Right 10/15/2016   Procedure: RIGHT UPPER LOBECTOMY;  Surgeon: [name], MD;  Location: [facility];  Service: Thoracic;  Laterality: Right;   LYMPH NODE DISSECTION Right 10/15/2016   Procedure: LYMPH NODE DISSECTION;  Surgeon: [name], MD;  Location: [facility];  Service: Thoracic;  Laterality: Right;   POLYPECTOMY  05/28/2019   Procedure: POLYPECTOMY;  Surgeon: Danie Binder, MD;  [facility];  Service: Endoscopy;;   SAVORY DILATION N/A 05/28/2019   Procedure: SAVORY DILATION;  Surgeon: Danie Binder, MD;  [facility];  Service: Endoscopy;  Laterality: N/A;  15/16/17   VAGINA RECONSTRUCTION SURGERY     tvt 2006- [place] (VATS)/WEDGE RESECTION Right 10/15/2016   Procedure: RIGHT VIDEO ASSISTED THORACOSCOPY (VATS)/WEDGE RESECTION;  Surgeon: [name], MD;  [facility];  Service: Thoracic;  Laterality: Right;    OB History     Gravida      Para      Term      Preterm      AB      Living  0      SAB      IAB      Ectopic      Multiple      Live Births               Home Medications    Prior to Admission medications   Medication Sig Start Date End Date Taking? Authorizing Provider  diclofenac Sodium (VOLTAREN ARTHRITIS PAIN) 1 % GEL Apply 4 g topically 4 (four) times daily. 05/15/22  Yes Genora Arp-Warren, Alda Lea, NP  ibuprofen (ADVIL) 800 MG tablet Take 800 mg by mouth every 8 (eight) hours as needed.   Yes [provider]  ACCU-CHEK AVIVA PLUS test strip USE  TO MONITOR GLUCOSE LEVELS TWICE A DAY E11.9 11/27/21   Renato Shin, MD  ACCU-CHEK SOFTCLIX LANCETS lancets Use to monitor glucose levels BID; E11.9 Patient taking differently: 1 each by Other route 2 (two) times daily. E11.9 08/21/18   Renato Shin, MD  acetaminophen (TYLENOL) 500 MG tablet Take 1 tablet (500 mg total) by mouth every 6 (six) hours as needed. Patient taking differently: Take 500 mg by mouth every 6 (six) hours as needed (for pain). 08/07/17   Horton, Barbette Hair, MD  atorvastatin (LIPITOR) 20 MG tablet Take 20 mg by mouth daily. 04/27/17  [provider]  benzonatate (TESSALON) 100 MG capsule Take 1 capsule (100 mg total) by mouth every 8 (eight) hours. 10/11/19   Avegno, Darrelyn Hillock, FNP  Blood Glucose Calibration (GLUCOSE CONTROL) SOLN 1 Bottle by In Vitro route as needed. Use to calibrate Accu-Chek Aviva Plus device prn; please provide control solution compatible with Accu-Chek Aviva Plus device. Patient taking differently: 1 each by Other route as needed (Use to calibrate glucometer). E11.9 08/21/18   Renato Shin, MD  Blood Glucose Monitoring Suppl (ACCU-CHEK AVIVA PLUS) w/Device KIT 1 each by Does not apply route 2 (two) times daily. Use to monitor glucose levels BID; E11.9 Patient taking differently: 1 each by Does not apply route 2 (two) times daily. E11.9 08/19/18   Renato Shin, MD  cetirizine (ZYRTEC ALLERGY) 10 MG tablet Take 1 tablet (10 mg total) by mouth daily. 10/11/19   Avegno, Darrelyn Hillock, FNP  chlorthalidone (HYGROTON) 25 MG tablet Take 12.5 mg by mouth daily.  11/26/16   [provider]  cyclobenzaprine (FLEXERIL) 10 MG tablet Take 1 tablet (10 mg total) by mouth 2 (two) times daily as needed for muscle spasms. 04/27/22   Mordecai Rasmussen, MD  escitalopram (LEXAPRO) 10 MG tablet Take 1 tablet (10 mg total) by mouth every morning. 03/12/22   Cloria Spring, MD  fluticasone (FLONASE) 50 MCG/ACT nasal spray Place 1 spray into both nostrils daily for 14 days.  10/11/19 02/06/21  Avegno, Darrelyn Hillock, FNP  insulin aspart (NOVOLOG FLEXPEN) 100 UNIT/ML FlexPen Inject 4-10 Units into the skin 3 (three) times daily with meals. 04/17/22   Brita Romp, NP  insulin glargine (LANTUS SOLOSTAR) 100 UNIT/ML Solostar Pen Inject 20 Units into the skin daily. 04/17/22   Brita Romp, NP  Insulin Pen Needle (PEN NEEDLES) 31G X 8 MM MISC Use As Directed 08/14/21   Renato Shin, MD  lisinopril (ZESTRIL) 10 MG tablet Take 10 mg by mouth daily. 05/30/20   [provider]  metFORMIN (GLUCOPHAGE-XR) 500 MG 24 hr tablet Take 1 tablet (500 mg total) by mouth daily with breakfast. 04/17/22   Brita Romp, NP  pantoprazole (PROTONIX) 40 MG tablet 1 PO 30 MINS BEFORE YOUR FIRST AND LAST MEAL. 11/21/20   Erenest Rasher, PA-C  promethazine (PHENERGAN) 12.5 MG tablet 1-2 po q4-6 h prn nausea or vomiting 11/10/19   Fields, Marga Melnick, MD  tirzepatide [facility]) 10 MG/0.5ML Pen Inject 10 mg into the skin once a week. 04/17/22   Brita Romp, NP  traZODone (DESYREL) 100 MG tablet Take 2 tablets (200 mg total) by mouth at bedtime. 03/12/22   Cloria Spring, MD  triamcinolone cream (KENALOG) 0.1 % Apply 1 application topically 2 (two) times daily. 03/30/21   Wurst, [place], PA-C  YUVAFEM 10 MCG TABS vaginal tablet Place 10 mcg vaginally See admin instructions. Twice a week as needed for vaginal dryness 09/29/16   [provider]    Family History Family History  Problem Relation Age of Onset   Asthma Other    Diabetes Other    Arthritis Other    Cancer Mother    Cancer Brother    Anesthesia problems Neg Hx    Hypotension Neg Hx    Malignant hyperthermia Neg Hx    Pseudochol deficiency Neg Hx    Colon polyps Neg Hx    Colon cancer Neg Hx    Celiac disease Neg Hx    Pancreatic cancer Neg Hx    Stomach cancer Neg  Hx    Ulcerative colitis Neg Hx    Crohn's disease Neg Hx     Social History Social History   Tobacco Use   Smoking status: Former     Packs/day: 1.00    Years: 32.00    Total pack years: 32.00    Types: Cigarettes   Smokeless tobacco: Never   Tobacco comments:    quit smoking 20 yrs ago  Vaping Use   Vaping Use: Never used  Substance Use Topics   Alcohol use: No    Comment: rarely   Drug use: No     Allergies   Synthroid [levothyroxine sodium], Hydrochlorothiazide, Iodine, and Povidone-iodine   Review of Systems Review of Systems Per HPI  Physical Exam Triage Vital Signs ED Triage Vitals  Enc Vitals Group     BP 05/15/22 1435 130/87     Pulse Rate 05/15/22 1435 67     Resp 05/15/22 1435 18     Temp 05/15/22 1435 98.5 F (36.9 C)     Temp Source 05/15/22 1435 Oral     SpO2 05/15/22 1435 95 %     Weight --      Height --      Head Circumference --      Peak Flow --      Pain Score 05/15/22 1433 8     Pain Loc --      Pain Edu? --      Excl. in [place]? --    No data found.  Updated Vital Signs BP 130/87 (BP Location: Right Arm)   Pulse 67   Temp 98.5 F (36.9 C) (Oral)   Resp 18   SpO2 95%   Visual Acuity Right Eye Distance:   Left Eye Distance:   Bilateral Distance:    Right Eye Near:   Left Eye Near:    Bilateral Near:     Physical Exam Vitals and nursing note reviewed.  Constitutional:      General: She is not in acute distress.    Appearance: Normal appearance.  Pulmonary:     Effort: Pulmonary effort is normal.  Musculoskeletal:     Left knee: No swelling. Tenderness present over the medial joint line and lateral joint line. Normal pulse.  Skin:    General: Skin is warm and dry.  Neurological:     General: No focal deficit present.     Mental Status: She is alert and oriented to person, place, and time.  Psychiatric:        Mood and Affect: Mood normal.        Behavior: Behavior normal.        Thought Content: Thought content normal.        Judgment: Judgment normal.      UC Treatments / Results  Labs (all labs ordered are listed, but only abnormal results  are displayed) Labs Reviewed - No data to display  EKG   Radiology DG Knee Complete 4 Views Left  Result Date: 05/15/2022 CLINICAL DATA:  Pain left knee x2 months EXAM: LEFT KNEE - COMPLETE 4+ VIEW COMPARISON:  None Available. FINDINGS: No recent fracture or dislocation is seen. Degenerative changes are noted with bony spurs in medial and patellofemoral compartments. There is narrowing of joint space in the medial compartment. There is no significant effusion. IMPRESSION: No fracture or dislocation is seen. Degenerative changes are noted with bony spurs in medial and patellofemoral compartments. Electronically Signed   By: Prudy Feeler.D.  On: 05/15/2022 15:17    Procedures Procedures (including critical care time)  Medications Ordered in UC Medications - No data to display  Initial Impression / Assessment and Plan / UC Course  I have reviewed the triage vital signs and the nursing notes.  Pertinent labs & imaging results that were available during my care of the patient were reviewed by me and considered in my medical decision making (see chart for details).  Patient presents for complaints of left knee pain that is been present for the past 2 months.  Patient's vital signs are stable, she is in no acute distress.  X-rays are negative for fracture or dislocation, but does show degenerative changes.  Symptoms are consistent with left knee pain; however cannot determine true etiology without further imaging.  Patient was prescribed Voltaren gel 1% to apply to the left knee 4 times daily as needed.  Supportive care recommendations were also provided to the patient to include RICE therapy.  Patient was also given a knee brace to provide compression and support.  Patient was advised to follow-up with orthopedics for further evaluation for continued or worsening pain.  Patient verbalizes understanding.  All questions were answered. Final Clinical Impressions(s) / UC Diagnoses   Final  diagnoses:  Left knee pain, unspecified chronicity     Discharge Instructions      The x-ray is negative for fracture or dislocation.  The x-ray does show degenerative changes of the left knee.  As discussed, the x-ray will not show abnormalities in the meniscus or tendons of the knee. Use medication as prescribed.  Continue use of ibuprofen and Flexeril as needed for pain and stiffness. Wear the knee brace for support and compression. Apply ice to the left knee.  Apply for 20 minutes, remove for 1 hour, then repeated as much as possible. Gentle stretching and range of motion exercises to the left knee while symptoms persist. Recommend following up with orthopedics because her pain has been persistent for the last 2 months.  You can follow-up with [facility] at [phone] or with [facility] at [phone].     ED Prescriptions     Medication Sig Dispense Auth. Provider   diclofenac Sodium (VOLTAREN ARTHRITIS PAIN) 1 % GEL Apply 4 g topically 4 (four) times daily. 150 g Laurin Paulo-Warren, Alda Lea, NP      PDMP not reviewed this encounter.   Tish Men, NP 05/15/22 [phone]

## 2022-05-15 NOTE — Discharge Instructions (Addendum)
The x-ray is negative for fracture or dislocation.  The x-ray does show degenerative changes of the left knee.  As discussed, the x-ray will not show abnormalities in the meniscus or tendons of the knee. Use medication as prescribed.  Continue use of ibuprofen and Flexeril as needed for pain and stiffness. Wear the knee brace for support and compression. Apply ice to the left knee.  Apply for 20 minutes, remove for 1 hour, then repeated as much as possible. Gentle stretching and range of motion exercises to the left knee while symptoms persist. Recommend following up with orthopedics because her pain has been persistent for the last 2 months.  You can follow-up with [facility] at [phone] or with [facility] at [phone].

## 2022-05-17 ENCOUNTER — Ambulatory Visit (INDEPENDENT_AMBULATORY_CARE_PROVIDER_SITE_OTHER): Payer: Medicare Other | Admitting: Nurse Practitioner

## 2022-05-17 ENCOUNTER — Encounter: Payer: Self-pay | Admitting: Nurse Practitioner

## 2022-05-17 VITALS — BP 140/92 | HR 84 | Ht 66.5 in | Wt 227.4 lb

## 2022-05-17 DIAGNOSIS — I10 Essential (primary) hypertension: Secondary | ICD-10-CM | POA: Diagnosis not present

## 2022-05-17 DIAGNOSIS — Z794 Long term (current) use of insulin: Secondary | ICD-10-CM

## 2022-05-17 DIAGNOSIS — E119 Type 2 diabetes mellitus without complications: Secondary | ICD-10-CM | POA: Diagnosis not present

## 2022-05-17 DIAGNOSIS — E782 Mixed hyperlipidemia: Secondary | ICD-10-CM | POA: Diagnosis not present

## 2022-05-17 LAB — POCT UA - MICROALBUMIN
Albumin/Creatinine Ratio, Urine, POC: <30
Albumin: 80
Creatinine, POC: 300 mg/dL

## 2022-05-17 LAB — POCT GLYCOSYLATED HEMOGLOBIN (HGB A1C): Hemoglobin A1C: 6.2 % — AB (ref 4.0–5.6)

## 2022-05-17 MED ORDER — NOVOLOG FLEXPEN 100 UNIT/ML ~~LOC~~ SOPN: 6.0000 [IU] | PEN_INJECTOR | Freq: Three times a day (TID) | SUBCUTANEOUS | 3 refills | Status: DC

## 2022-05-17 NOTE — Patient Instructions (Signed)

## 2022-05-17 NOTE — Progress Notes (Signed)
Endocrinology Follow Up Note       05/17/2022, 10:11 AM   Subjective:    Patient ID: [name], female    DOB: 05-01-1956.  [name] is being seen in follow up after being seen in consultation for management of currently uncontrolled symptomatic diabetes requested by  Lujean Amel, MD.   Past Medical History:  Diagnosis Date   Adenocarcinoma of right lung, stage 1 ([place]) 11/29/2016   Cancer ([place])    Chronic pain    Depression    takes Lexapro daily   Diabetes mellitus    takes Metformin daily and has an insulin pump.Fasting blood sugar runs250   GERD (gastroesophageal reflux disease)    takes Pantoprazole daily   History of colon polyps    benign   Hyperlipidemia    takes Atorvastatin daily   Hypertension    takes Amlodipine and Lisinopril daily   Insomnia    takes Trazodone nightly   Nocturia    Thyroid disease    Urinary frequency    Urinary urgency     Past Surgical History:  Procedure Laterality Date   ABDOMINAL HYSTERECTOMY     ABDOMINAL SURGERY     BIOPSY  05/28/2019   Procedure: BIOPSY;  Surgeon: Danie Binder, MD;  [facility];  Service: Endoscopy;;  random colon/gastric/duodenum   COLONOSCOPY  2005 MAC   TICs, IH   COLONOSCOPY N/A 03/08/2014   Procedure: COLONOSCOPY;  Surgeon: Danie Binder, MD;  [facility];  Service: Endoscopy;  Laterality: N/A;  1015   COLONOSCOPY WITH PROPOFOL N/A 05/28/2019   Procedure: COLONOSCOPY WITH PROPOFOL;  Surgeon: Danie Binder, MD;  [facility];  Service: Endoscopy;  Laterality: N/A;  9:15am   CYSTOSTOMY  11/22/2011   Procedure: CYSTOSTOMY SUPRAPUBIC;  Surgeon: Marissa Nestle, MD;  Location: [facility];  Service: Urology;  Laterality: N/A;   ESOPHAGOGASTRODUODENOSCOPY N/A 03/08/2014   Procedure: ESOPHAGOGASTRODUODENOSCOPY (EGD);  Surgeon: Danie Binder, MD;  [facility];  Service: Endoscopy;   Laterality: N/A;   ESOPHAGOGASTRODUODENOSCOPY     ESOPHAGOGASTRODUODENOSCOPY (EGD) WITH PROPOFOL N/A 05/28/2019   Procedure: ESOPHAGOGASTRODUODENOSCOPY (EGD) WITH PROPOFOL;  Surgeon: Danie Binder, MD;  [facility];  Service: Endoscopy;  Laterality: N/A;   GIVENS CAPSULE STUDY N/A 06/15/2019   Procedure: GIVENS CAPSULE STUDY;  Surgeon: Danie Binder, MD;  [facility];  Service: Endoscopy;  Laterality: N/A;  7:30am   HALLUX VALGUS CORRECTION     bilateral foot surgery for bone repairs-multiple   LOBECTOMY Right 10/15/2016   Procedure: RIGHT UPPER LOBECTOMY;  Surgeon: [name], MD;  Location: [place];  Service: Thoracic;  Laterality: Right;   LYMPH NODE DISSECTION Right 10/15/2016   Procedure: LYMPH NODE DISSECTION;  Surgeon: [name], MD;  Location: [place];  Service: Thoracic;  Laterality: Right;   POLYPECTOMY  05/28/2019   Procedure: POLYPECTOMY;  Surgeon: Danie Binder, MD;  [facility];  Service: Endoscopy;;   SAVORY DILATION N/A 05/28/2019   Procedure: SAVORY DILATION;  Surgeon: Danie Binder, MD;  [facility];  Service: Endoscopy;  Laterality: N/A;  15/16/17   VAGINA RECONSTRUCTION SURGERY     tvt 2006- [place] (VATS)/WEDGE RESECTION Right 10/15/2016   Procedure: RIGHT VIDEO ASSISTED THORACOSCOPY (VATS)/WEDGE RESECTION;  Surgeon: [name], MD;  [facility];  Service: Thoracic;  Laterality: Right;    Social History   Socioeconomic History   Marital status: Single    Spouse name: Not on file   Number of children: Not on file   Years of education: Not on file   Highest education level: Not on file  Occupational History   Not on file  Tobacco Use   Smoking status: Former    Packs/day: 1.00    Years: 32.00    Total pack years: 32.00    Types: Cigarettes   Smokeless tobacco: Never   Tobacco comments:    quit smoking 20 yrs ago  Vaping Use   Vaping Use:  Never used  Substance and Sexual Activity   Alcohol use: No    Comment: rarely   Drug use: No   Sexual activity: Not on file  Other Topics Concern   Not on file  Social History Narrative   Not on file   Social Determinants of Health   Financial Resource Strain: Not on file  Food Insecurity: Not on file  Transportation Needs: Not on file  Physical Activity: Not on file  Stress: Not on file  Social Connections: Not on file    Family History  Problem Relation Age of Onset   Asthma Other    Diabetes Other    Arthritis Other    Cancer Mother    Cancer Brother    Anesthesia problems Neg Hx    Hypotension Neg Hx    Malignant hyperthermia Neg Hx    Pseudochol deficiency Neg Hx    Colon polyps Neg Hx    Colon cancer Neg Hx    Celiac disease Neg Hx    Pancreatic cancer Neg Hx    Stomach cancer Neg Hx    Ulcerative colitis Neg Hx    Crohn's disease Neg Hx     Outpatient Encounter Medications as of 05/17/2022  Medication Sig   ACCU-CHEK AVIVA PLUS test strip USE TO MONITOR GLUCOSE LEVELS TWICE A DAY E11.9   ACCU-CHEK SOFTCLIX LANCETS lancets Use to monitor glucose levels BID; E11.9 (Patient taking differently: 1 each by Other route 2 (two) times daily. E11.9)   acetaminophen (TYLENOL) 500 MG tablet Take 1 tablet (500 mg total) by mouth every 6 (six) hours as needed. (Patient taking differently: Take 500 mg by mouth every 6 (six) hours as needed (for pain).)   atorvastatin (LIPITOR) 20 MG tablet Take 20 mg by mouth daily.   benzonatate (TESSALON) 100 MG capsule Take 1 capsule (100 mg total) by mouth every 8 (eight) hours.   Blood Glucose Calibration (GLUCOSE CONTROL) SOLN 1 Bottle by In Vitro route as needed. Use to calibrate Accu-Chek Aviva Plus device prn; please provide control solution compatible with Accu-Chek Aviva Plus device. (Patient taking differently: 1 each by Other route as needed (Use to calibrate glucometer). E11.9)   Blood Glucose Monitoring Suppl (ACCU-CHEK AVIVA  PLUS) w/Device KIT 1 each by Does not apply route 2 (two) times daily. Use to monitor glucose levels BID; E11.9 (Patient taking differently: 1 each by Does not apply route 2 (two) times daily. E11.9)   cetirizine (ZYRTEC ALLERGY) 10 MG tablet Take 1 tablet (10 mg total) by mouth daily.   chlorthalidone (HYGROTON) 25 MG tablet  Take 12.5 mg by mouth daily.    cyclobenzaprine (FLEXERIL) 10 MG tablet Take 1 tablet (10 mg total) by mouth 2 (two) times daily as needed for muscle spasms.   diclofenac Sodium (VOLTAREN ARTHRITIS PAIN) 1 % GEL Apply 4 g topically 4 (four) times daily.   escitalopram (LEXAPRO) 10 MG tablet Take 1 tablet (10 mg total) by mouth every morning.   ibuprofen (ADVIL) 800 MG tablet Take 800 mg by mouth every 8 (eight) hours as needed.   insulin glargine (LANTUS SOLOSTAR) 100 UNIT/ML Solostar Pen Inject 20 Units into the skin daily.   Insulin Pen Needle (PEN NEEDLES) 31G X 8 MM MISC Use As Directed   lisinopril (ZESTRIL) 10 MG tablet Take 10 mg by mouth daily.   metFORMIN (GLUCOPHAGE-XR) 500 MG 24 hr tablet Take 1 tablet (500 mg total) by mouth daily with breakfast.   pantoprazole (PROTONIX) 40 MG tablet 1 PO 30 MINS BEFORE YOUR FIRST AND LAST MEAL.   promethazine (PHENERGAN) 12.5 MG tablet 1-2 po q4-6 h prn nausea or vomiting   tirzepatide (MOUNJARO) 10 MG/0.5ML Pen Inject 10 mg into the skin once a week.   traZODone (DESYREL) 100 MG tablet Take 2 tablets (200 mg total) by mouth at bedtime.   triamcinolone cream (KENALOG) 0.1 % Apply 1 application topically 2 (two) times daily.   [DISCONTINUED] insulin aspart (NOVOLOG FLEXPEN) 100 UNIT/ML FlexPen Inject 4-10 Units into the skin 3 (three) times daily with meals.   fluticasone (FLONASE) 50 MCG/ACT nasal spray Place 1 spray into both nostrils daily for 14 days.   insulin aspart (NOVOLOG FLEXPEN) 100 UNIT/ML FlexPen Inject 6-12 Units into the skin 3 (three) times daily with meals.   [DISCONTINUED] YUVAFEM 10 MCG TABS vaginal tablet  Place 10 mcg vaginally See admin instructions. Twice a week as needed for vaginal dryness   No facility-administered encounter medications on file as of 05/17/2022.    ALLERGIES: Allergies  Allergen Reactions   Synthroid [Levothyroxine Sodium] Anaphylaxis   Hydrochlorothiazide     Other reaction(s): cramps   Iodine Rash    I get a skin rash sometimes if iodine applied to my skin.     Povidone-Iodine Itching and Rash    VACCINATION STATUS:  There is no immunization history on file for this patient.  Diabetes She presents for her follow-up diabetic visit. She has type 2 diabetes mellitus. Onset time: diagnosed at approx age of 72. Her disease course has been improving. There are no hypoglycemic associated symptoms. There are no diabetic associated symptoms. There are no hypoglycemic complications. There are no diabetic complications. Risk factors for coronary artery disease include diabetes mellitus, obesity, hypertension and post-menopausal. Current diabetic treatment includes intensive insulin program and oral agent (monotherapy) (and Mounjaro). She is compliant with treatment all of the time. Her weight is decreasing steadily. She is following a generally healthy diet. Meal planning includes avoidance of concentrated sweets. She has not had a previous visit with a dietitian. She participates in exercise daily. Her home blood glucose trend is decreasing steadily. (She presents today with her CGM and logs showing at goal glycemic profile overall.  Her POCT A1c today is 6.2%.  Analysis of her CGM shows TIR 93%, TAR 5%, TBR 2% with a GMI of 6.3%.  She did have alarms at night stating lows, rechecked her glucose with her meter and got 80's.  ) An ACE inhibitor/angiotensin II receptor blocker is being taken. She sees a podiatrist.Eye exam is current.     Review  of systems  Constitutional: + steadily decreasing body weight,  current Body mass index is 36.15 kg/m. , no fatigue, no subjective  hyperthermia, no subjective hypothermia Eyes: no blurry vision, no xerophthalmia ENT: no sore throat, no nodules palpated in throat, no dysphagia/odynophagia, no hoarseness Cardiovascular: no chest pain, no shortness of breath, no palpitations, no leg swelling Respiratory: no cough, no shortness of breath Gastrointestinal: no nausea/vomiting/diarrhea Musculoskeletal: no muscle/joint aches Skin: no rashes, no hyperemia Neurological: no tremors, no numbness, no tingling, no dizziness Psychiatric: no depression, no anxiety  Objective:     BP (!) 140/92 (BP Location: Right Arm, Patient Position: Sitting, Cuff Size: Large)   Pulse 84   Ht 5' 6.5" (1.689 m)   Wt 227 lb 6.4 oz (103.1 kg)   BMI 36.15 kg/m   Wt Readings from Last 3 Encounters:  05/17/22 227 lb 6.4 oz (103.1 kg)  04/27/22 227 lb (103 kg)  04/17/22 230 lb 12.8 oz (104.7 kg)     BP Readings from Last 3 Encounters:  05/17/22 (!) 140/92  05/15/22 130/87  04/27/22 (!) 148/76      Physical Exam- Limited  Constitutional:  Body mass index is 36.15 kg/m. , not in acute distress, normal state of mind Eyes:  EOMI, no exophthalmos Neck: Supple Cardiovascular: RRR, no murmurs, rubs, or gallops, no edema Respiratory: Adequate breathing efforts, no crackles, rales, rhonchi, or wheezing Musculoskeletal: no gross deformities, strength intact in all four extremities, no gross restriction of joint movements Skin:  no rashes, no hyperemia Neurological: no tremor with outstretched hands  Diabetic Foot Exam - Simple   No data filed     CMP ( most recent) CMP     Component Value Date/Time   NA 142 06/09/2021 0844   NA 141 05/29/2017 0921   K 4.5 06/09/2021 0844   K 4.8 05/29/2017 0921   CL 105 06/09/2021 0844   CO2 27 06/09/2021 0844   CO2 28 05/29/2017 0921   GLUCOSE 134 (H) 06/09/2021 0844   GLUCOSE 184 (H) 05/29/2017 0921   BUN 16 06/09/2021 0844   BUN 13.5 05/29/2017 0921   CREATININE 0.81 06/09/2021 0844    CREATININE 0.8 05/29/2017 0921   CALCIUM 9.2 06/09/2021 0844   CALCIUM 9.5 05/29/2017 0921   PROT 6.8 06/09/2021 0844   PROT 7.1 05/29/2017 0921   ALBUMIN 80 mg/L 05/17/2022 0922   ALBUMIN 3.6 05/29/2017 0921   AST 16 06/09/2021 0844   AST 17 05/29/2017 0921   ALT 18 06/09/2021 0844   ALT 20 05/29/2017 0921   ALKPHOS 78 06/09/2021 0844   ALKPHOS 104 05/29/2017 0921   BILITOT 0.5 06/09/2021 0844   BILITOT 0.40 05/29/2017 0921   GFRNONAA >60 06/09/2021 0844   GFRAA >60 12/08/2019 0849     Diabetic Labs (most recent): Lab Results  Component Value Date   HGBA1C 6.2 (A) 05/17/2022   HGBA1C 6.7 (A) 09/26/2021   HGBA1C 7.1 (A) 06/26/2021     Lipid Panel ( most recent) Lipid Panel  No results found for: "CHOL", "TRIG", "HDL", "CHOLHDL", "VLDL", "LDLCALC", "LDLDIRECT", "LABVLDL"    Lab Results  Component Value Date   TSH 2.93 07/11/2016           Assessment & Plan:   1) Type 2 diabetes mellitus without complication, with long-term current use of insulin ([place])  She presents today with her CGM and logs showing at goal glycemic profile overall.  Her POCT A1c today is 6.2%.  Analysis of her CGM shows TIR  93%, TAR 5%, TBR 2% with a GMI of 6.3%.  She did have alarms at night stating lows, rechecked her glucose with her meter and got 80's.    - [name] has currently uncontrolled symptomatic type 2 DM since 66 years of age.   -Recent labs reviewed.  - I had a long discussion with her about the progressive nature of diabetes and the pathology behind its complications. -her diabetes is not currently complicated but she remains at a high risk for more acute and chronic complications which include CAD, CVA, CKD, retinopathy, and neuropathy. These are all discussed in detail with her.  The following Lifestyle Medicine recommendations according to [facility]) were discussed and offered to patient and she agrees to start the journey:  A.  Whole Foods, Plant-based plate comprising of fruits and vegetables, plant-based proteins, whole-grain carbohydrates was discussed in detail with the patient.   A list for source of those nutrients were also provided to the patient.  Patient will use only water or unsweetened tea for hydration. B.  The need to stay away from risky substances including alcohol, smoking; obtaining 7 to 9 hours of restorative sleep, at least 150 minutes of moderate intensity exercise weekly, the importance of healthy social connections,  and stress reduction techniques were discussed. C.  A full color page of  Calorie density of various food groups per pound showing examples of each food groups was provided to the patient.  - Nutritional counseling repeated at each appointment due to patients tendency to fall back in to old habits.  - The patient admits there is a room for improvement in their diet and drink choices. -  Suggestion is made for the patient to avoid simple carbohydrates from their diet including Cakes, Sweet Desserts / Pastries, Ice Cream, Soda (diet and regular), Sweet Tea, Candies, Chips, Cookies, Sweet Pastries, Store Bought Juices, Alcohol in Excess of 1-2 drinks a day, Artificial Sweeteners, Coffee Creamer, and "Sugar-free" Products. This will help patient to have stable blood glucose profile and potentially avoid unintended weight gain.   - I encouraged the patient to switch to unprocessed or minimally processed complex starch and increased protein intake (animal or plant source), fruits, and vegetables.   - Patient is advised to stick to a routine mealtimes to eat 3 meals a day and avoid unnecessary snacks (to snack only to correct hypoglycemia).  - I have approached her with the following individualized plan to manage her diabetes and patient agrees:   -She is advised to continue her Lantus 20 units SQ nightly and adjust her Novolog to 6-12 units TID with meals if glucose is above 90 and she is  eating (Specific instructions on how to titrate insulin dosage based on glucose readings given to patient in writing).  She can also continue Metformin 500 mg ER daily.  Will increase her Mounjaro to 12.5 mg SQ weekly after she finishes her current supply of 10 mg doses.  She will call once she is on last pen for [place] to send in new Rx.  -she is encouraged to start monitoring glucose 4 times daily (using her CGM), before meals and before bed, and to call the clinic if she has readings less than 70 or above 300 for 3 tests in a row.  - she is warned not to take insulin without proper monitoring per orders. - Adjustment parameters are given to her for hypo and hyperglycemia in writing.  - her Parlodel and  [place] was discontinued previously, due to side effect profile and to deescalate her treatment plan.   - Specific targets for  A1c; LDL, HDL, and Triglycerides were discussed with the patient.  2) Blood Pressure /Hypertension:  her blood pressure is controlled to target.   she is advised to continue her current medications including Lisinopril 10 mg p.o. daily with breakfast, Chlorthalidone 25 mg po daily.  3) Lipids/Hyperlipidemia:    There is no recent lipid panel available to review .  she is advised to continue Lipitor 20 mg daily at bedtime.  Side effects and precautions discussed with her.  She had labs recently by PCP, she will call and request copy to be sent here for our records.  4)  Weight/Diet:  her Body mass index is 36.15 kg/m.  -  clearly complicating her diabetes care.   she is a candidate for weight loss. I discussed with her the fact that loss of 5 - 10% of her  current body weight will have the most impact on her diabetes management.  Exercise, and detailed carbohydrates information provided  -  detailed on discharge instructions.  5) Chronic Care/Health Maintenance: -she is on ACEI/ARB and Statin medications and is encouraged to initiate and continue to follow up with  Ophthalmology, Dentist, Podiatrist at least yearly or according to recommendations, and advised to stay away from smoking. I have recommended yearly flu vaccine and pneumonia vaccine at least every 5 years; moderate intensity exercise for up to 150 minutes weekly; and sleep for at least 7 hours a day.  - she is advised to maintain close follow up with Lujean Amel, MD for primary care needs, as well as her other providers for optimal and coordinated care.     I spent 40 minutes in the care of the patient today including review of labs from [place], Lipids, Thyroid Function, Hematology (current and previous including abstractions from other facilities); face-to-face time discussing  her blood glucose readings/logs, discussing hypoglycemia and hyperglycemia episodes and symptoms, medications doses, her options of short and long term treatment based on the latest standards of care / guidelines;  discussion about incorporating lifestyle medicine;  and documenting the encounter. Risk reduction counseling performed per USPSTF guidelines to reduce obesity and cardiovascular risk factors.     Please refer to Patient Instructions for Blood Glucose Monitoring and Insulin/Medications Dosing Guide"  in media tab for additional information. Please  also refer to " Patient Self Inventory" in the Media  tab for reviewed elements of pertinent patient history.  Salvadore Oxford participated in the discussions, expressed understanding, and voiced agreement with the above plans.  All questions were answered to her satisfaction. she is encouraged to contact clinic should she have any questions or concerns prior to her return visit.     Follow up plan: - Return in about 3 months (around 08/17/2022) for Diabetes F/U with A1c in office, No previsit labs, Bring meter and logs.    Rayetta Pigg, [facility] [address] [place], [place] [address] Phone: [phone] Fax:  [phone]  05/17/2022, 10:11 AM

## 2022-05-24 ENCOUNTER — Ambulatory Visit (HOSPITAL_COMMUNITY): Payer: Medicare Other

## 2022-05-31 ENCOUNTER — Other Ambulatory Visit: Payer: Self-pay | Admitting: Family Medicine

## 2022-05-31 DIAGNOSIS — Z1231 Encounter for screening mammogram for malignant neoplasm of breast: Secondary | ICD-10-CM

## 2022-06-11 ENCOUNTER — Telehealth: Payer: Self-pay | Admitting: Medical Oncology

## 2022-06-11 NOTE — Telephone Encounter (Signed)
LVM to return my call re "allergy " to topical iodine causing a rash.

## 2022-06-12 ENCOUNTER — Inpatient Hospital Stay: Payer: Medicare Other | Attending: Internal Medicine

## 2022-06-12 ENCOUNTER — Ambulatory Visit (HOSPITAL_COMMUNITY)
Admission: RE | Admit: 2022-06-12 | Discharge: 2022-06-12 | Disposition: A | Discharge status: Home / Self Care | Payer: Medicare Other | Source: Ambulatory Visit | Attending: Internal Medicine | Admitting: Internal Medicine

## 2022-06-12 DIAGNOSIS — C3490 Malignant neoplasm of unspecified part of unspecified bronchus or lung: Secondary | ICD-10-CM | POA: Diagnosis present

## 2022-06-12 DIAGNOSIS — Z79899 Other long term (current) drug therapy: Secondary | ICD-10-CM | POA: Insufficient documentation

## 2022-06-12 DIAGNOSIS — I700 Atherosclerosis of aorta: Secondary | ICD-10-CM | POA: Insufficient documentation

## 2022-06-12 DIAGNOSIS — K219 Gastro-esophageal reflux disease without esophagitis: Secondary | ICD-10-CM | POA: Insufficient documentation

## 2022-06-12 DIAGNOSIS — E785 Hyperlipidemia, unspecified: Secondary | ICD-10-CM | POA: Insufficient documentation

## 2022-06-12 DIAGNOSIS — E119 Type 2 diabetes mellitus without complications: Secondary | ICD-10-CM | POA: Insufficient documentation

## 2022-06-12 DIAGNOSIS — Z86010 Personal history of colonic polyps: Secondary | ICD-10-CM | POA: Insufficient documentation

## 2022-06-12 DIAGNOSIS — E079 Disorder of thyroid, unspecified: Secondary | ICD-10-CM | POA: Insufficient documentation

## 2022-06-12 DIAGNOSIS — Z7984 Long term (current) use of oral hypoglycemic drugs: Secondary | ICD-10-CM | POA: Insufficient documentation

## 2022-06-12 DIAGNOSIS — Z791 Long term (current) use of non-steroidal anti-inflammatories (NSAID): Secondary | ICD-10-CM | POA: Insufficient documentation

## 2022-06-12 DIAGNOSIS — G4700 Insomnia, unspecified: Secondary | ICD-10-CM | POA: Insufficient documentation

## 2022-06-12 DIAGNOSIS — I10 Essential (primary) hypertension: Secondary | ICD-10-CM | POA: Insufficient documentation

## 2022-06-12 DIAGNOSIS — C3411 Malignant neoplasm of upper lobe, right bronchus or lung: Secondary | ICD-10-CM | POA: Insufficient documentation

## 2022-06-12 LAB — CBC WITH DIFFERENTIAL (CANCER CENTER ONLY)
Abs Immature Granulocytes: 0.04 10*3/uL (ref 0.00–0.07)
Basophils Absolute: 0.1 10*3/uL (ref 0.0–0.1)
Basophils Relative: 1 %
Eosinophils Absolute: 0.3 10*3/uL (ref 0.0–0.5)
Eosinophils Relative: 3 %
HCT: 44 % (ref 36.0–46.0)
Hemoglobin: 15.3 g/dL — ABNORMAL HIGH (ref 12.0–15.0)
Immature Granulocytes: 0 %
Lymphocytes Relative: 23 %
Lymphs Abs: 2.2 10*3/uL (ref 0.7–4.0)
MCH: 28.6 pg (ref 26.0–34.0)
MCHC: 34.8 g/dL (ref 30.0–36.0)
MCV: 82.2 fL (ref 80.0–100.0)
Monocytes Absolute: 0.4 10*3/uL (ref 0.1–1.0)
Monocytes Relative: 5 %
Neutro Abs: 6.5 10*3/uL (ref 1.7–7.7)
Neutrophils Relative %: 68 %
Platelet Count: 234 10*3/uL (ref 150–400)
RBC: 5.35 MIL/uL — ABNORMAL HIGH (ref 3.87–5.11)
RDW: 13 % (ref 11.5–15.5)
WBC Count: 9.5 10*3/uL (ref 4.0–10.5)
nRBC: 0 % (ref 0.0–0.2)

## 2022-06-12 LAB — CMP (CANCER CENTER ONLY)
ALT: 20 U/L (ref 0–44)
AST: 17 U/L (ref 15–41)
Albumin: 4.1 g/dL (ref 3.5–5.0)
Alkaline Phosphatase: 64 U/L (ref 38–126)
Anion gap: 8 (ref 5–15)
BUN: 18 mg/dL (ref 8–23)
CO2: 25 mmol/L (ref 22–32)
Calcium: 9.2 mg/dL (ref 8.9–10.3)
Chloride: 107 mmol/L (ref 98–111)
Creatinine: 0.76 mg/dL (ref 0.44–1.00)
GFR, Estimated: >60 mL/min (ref >60)
Glucose, Bld: 111 mg/dL — ABNORMAL HIGH (ref 70–99)
Potassium: 4 mmol/L (ref 3.5–5.1)
Sodium: 140 mmol/L (ref 135–145)
Total Bilirubin: 0.5 mg/dL (ref 0.3–1.2)
Total Protein: 6.7 g/dL (ref 6.5–8.1)

## 2022-06-12 MED ORDER — SODIUM CHLORIDE (PF) 0.9 % IJ SOLN
INTRAMUSCULAR | Status: AC
  Filled 2022-06-12: qty 50

## 2022-06-12 MED ORDER — IOHEXOL 300 MG/ML  SOLN
75.0000 mL | Freq: Once | INTRAMUSCULAR | Status: AC | PRN
  Administered 2022-06-12: 75 mL via INTRAVENOUS

## 2022-06-14 ENCOUNTER — Inpatient Hospital Stay (HOSPITAL_BASED_OUTPATIENT_CLINIC_OR_DEPARTMENT_OTHER): Payer: Medicare Other | Admitting: Internal Medicine

## 2022-06-14 VITALS — BP 141/72 | HR 62 | Temp 98.2°F | Resp 15 | Wt 226.8 lb

## 2022-06-14 DIAGNOSIS — Z86010 Personal history of colonic polyps: Secondary | ICD-10-CM | POA: Diagnosis not present

## 2022-06-14 DIAGNOSIS — E785 Hyperlipidemia, unspecified: Secondary | ICD-10-CM | POA: Diagnosis not present

## 2022-06-14 DIAGNOSIS — C3411 Malignant neoplasm of upper lobe, right bronchus or lung: Secondary | ICD-10-CM | POA: Diagnosis not present

## 2022-06-14 DIAGNOSIS — K219 Gastro-esophageal reflux disease without esophagitis: Secondary | ICD-10-CM | POA: Diagnosis not present

## 2022-06-14 DIAGNOSIS — E119 Type 2 diabetes mellitus without complications: Secondary | ICD-10-CM | POA: Diagnosis not present

## 2022-06-14 DIAGNOSIS — Z79899 Other long term (current) drug therapy: Secondary | ICD-10-CM | POA: Diagnosis not present

## 2022-06-14 DIAGNOSIS — I10 Essential (primary) hypertension: Secondary | ICD-10-CM | POA: Diagnosis not present

## 2022-06-14 DIAGNOSIS — Z7984 Long term (current) use of oral hypoglycemic drugs: Secondary | ICD-10-CM | POA: Diagnosis not present

## 2022-06-14 DIAGNOSIS — C3490 Malignant neoplasm of unspecified part of unspecified bronchus or lung: Secondary | ICD-10-CM

## 2022-06-14 DIAGNOSIS — I700 Atherosclerosis of aorta: Secondary | ICD-10-CM | POA: Diagnosis not present

## 2022-06-14 DIAGNOSIS — G4700 Insomnia, unspecified: Secondary | ICD-10-CM | POA: Diagnosis not present

## 2022-06-14 DIAGNOSIS — E079 Disorder of thyroid, unspecified: Secondary | ICD-10-CM | POA: Diagnosis not present

## 2022-06-14 DIAGNOSIS — Z791 Long term (current) use of non-steroidal anti-inflammatories (NSAID): Secondary | ICD-10-CM | POA: Diagnosis not present

## 2022-06-14 NOTE — Progress Notes (Signed)
[place] [phone]   [phone]  OFFICE PROGRESS NOTE  Koirala, Dibas, MD [place] [address] [place] [address]  DIAGNOSIS: Stage IA (T1a, N0, M0) non-small cell lung cancer, adenocarcinoma presented with right upper lobe pulmonary nodule   PRIOR THERAPY: S/P right VATS with right upper lobectomy and mediastinal lymph node dissection under the care of Dr. Roxan Hockey on October 15, 2016.  CURRENT THERAPY: Observation.  INTERVAL HISTORY: [name] 66 y.o. female returns to the clinic today for follow-up visit.  The patient is feeling fine today with no concerning complaints.  She denied having any current chest pain, shortness of breath, cough or hemoptysis.  She has no nausea, vomiting, diarrhea or constipation.  She has no headache or visual changes.  She has no recent weight loss or night sweats.  She is here today for evaluation with repeat CT scan of the chest for restaging of her disease.  MEDICAL HISTORY: Past Medical History:  Diagnosis Date   Adenocarcinoma of right lung, stage 1 ([facility]) 11/29/2016   Cancer (HCC)    Chronic pain    Depression    takes Lexapro daily   Diabetes mellitus    takes Metformin daily and has an insulin pump.Fasting blood sugar runs250   GERD (gastroesophageal reflux disease)    takes Pantoprazole daily   History of colon polyps    benign   Hyperlipidemia    takes Atorvastatin daily   Hypertension    takes Amlodipine and Lisinopril daily   Insomnia    takes Trazodone nightly   Nocturia    Thyroid disease    Urinary frequency    Urinary urgency     ALLERGIES:  is allergic to synthroid [levothyroxine sodium], hydrochlorothiazide, and iodine.  MEDICATIONS:  Current Outpatient Medications  Medication Sig Dispense Refill   ACCU-CHEK AVIVA PLUS test strip USE TO MONITOR GLUCOSE LEVELS TWICE A DAY E11.9 100 strip 12   ACCU-CHEK SOFTCLIX LANCETS lancets Use to monitor glucose  levels BID; E11.9 (Patient taking differently: 1 each by Other route 2 (two) times daily. E11.9) 100 each 12   acetaminophen (TYLENOL) 500 MG tablet Take 1 tablet (500 mg total) by mouth every 6 (six) hours as needed. (Patient taking differently: Take 500 mg by mouth every 6 (six) hours as needed (for pain).) 30 tablet 0   atorvastatin (LIPITOR) 20 MG tablet Take 20 mg by mouth daily.  11   benzonatate (TESSALON) 100 MG capsule Take 1 capsule (100 mg total) by mouth every 8 (eight) hours. 30 capsule 0   Blood Glucose Calibration (GLUCOSE CONTROL) SOLN 1 Bottle by In Vitro route as needed. Use to calibrate Accu-Chek Aviva Plus device prn; please provide control solution compatible with Accu-Chek Aviva Plus device. (Patient taking differently: 1 each by Other route as needed (Use to calibrate glucometer). E11.9) 1 each 1   Blood Glucose Monitoring Suppl (ACCU-CHEK AVIVA PLUS) w/Device KIT 1 each by Does not apply route 2 (two) times daily. Use to monitor glucose levels BID; E11.9 (Patient taking differently: 1 each by Does not apply route 2 (two) times daily. E11.9) 1 kit 0   cetirizine (ZYRTEC ALLERGY) 10 MG tablet Take 1 tablet (10 mg total) by mouth daily. 30 tablet 0   chlorthalidone (HYGROTON) 25 MG tablet Take 12.5 mg by mouth daily.      cyclobenzaprine (FLEXERIL) 10 MG tablet Take 1 tablet (10 mg total) by mouth 2 (two) times daily as  needed for muscle spasms. 20 tablet 0   diclofenac Sodium (VOLTAREN ARTHRITIS PAIN) 1 % GEL Apply 4 g topically 4 (four) times daily. 150 g 0   escitalopram (LEXAPRO) 10 MG tablet Take 1 tablet (10 mg total) by mouth every morning. 90 tablet 3   fluticasone (FLONASE) 50 MCG/ACT nasal spray Place 1 spray into both nostrils daily for 14 days. 16 g 0   ibuprofen (ADVIL) 800 MG tablet Take 800 mg by mouth every 8 (eight) hours as needed.     insulin aspart (NOVOLOG FLEXPEN) 100 UNIT/ML FlexPen Inject 6-12 Units into the skin 3 (three) times daily with meals. 30 mL 3    insulin glargine (LANTUS SOLOSTAR) 100 UNIT/ML Solostar Pen Inject 20 Units into the skin daily. 18 mL 3   Insulin Pen Needle (PEN NEEDLES) 31G X 8 MM MISC Use As Directed 100 each 5   lisinopril (ZESTRIL) 10 MG tablet Take 10 mg by mouth daily.     metFORMIN (GLUCOPHAGE-XR) 500 MG 24 hr tablet Take 1 tablet (500 mg total) by mouth daily with breakfast. 90 tablet 3   pantoprazole (PROTONIX) 40 MG tablet 1 PO 30 MINS BEFORE YOUR FIRST AND LAST MEAL. 60 tablet 5   promethazine (PHENERGAN) 12.5 MG tablet 1-2 po q4-6 h prn nausea or vomiting 30 tablet 1   tirzepatide (MOUNJARO) 10 MG/0.5ML Pen Inject 10 mg into the skin once a week. 6 mL 3   traZODone (DESYREL) 100 MG tablet Take 2 tablets (200 mg total) by mouth at bedtime. 180 tablet 3   triamcinolone cream (KENALOG) 0.1 % Apply 1 application topically 2 (two) times daily. 30 g 0   No current facility-administered medications for this visit.    SURGICAL HISTORY:  Past Surgical History:  Procedure Laterality Date   ABDOMINAL HYSTERECTOMY     ABDOMINAL SURGERY     BIOPSY  05/28/2019   Procedure: BIOPSY;  Surgeon: Danie Binder, MD;  [facility];  Service: Endoscopy;;  random colon/gastric/duodenum   COLONOSCOPY  2005 MAC   TICs, IH   COLONOSCOPY N/A 03/08/2014   Procedure: COLONOSCOPY;  Surgeon: Danie Binder, MD;  [facility];  Service: Endoscopy;  Laterality: N/A;  1015   COLONOSCOPY WITH PROPOFOL N/A 05/28/2019   Procedure: COLONOSCOPY WITH PROPOFOL;  Surgeon: Danie Binder, MD;  [facility];  Service: Endoscopy;  Laterality: N/A;  9:15am   CYSTOSTOMY  11/22/2011   Procedure: CYSTOSTOMY SUPRAPUBIC;  Surgeon: Marissa Nestle, MD;  Location: [facility];  Service: Urology;  Laterality: N/A;   ESOPHAGOGASTRODUODENOSCOPY N/A 03/08/2014   Procedure: ESOPHAGOGASTRODUODENOSCOPY (EGD);  Surgeon: Danie Binder, MD;  [facility];  Service: Endoscopy;  Laterality: N/A;   ESOPHAGOGASTRODUODENOSCOPY      ESOPHAGOGASTRODUODENOSCOPY (EGD) WITH PROPOFOL N/A 05/28/2019   Procedure: ESOPHAGOGASTRODUODENOSCOPY (EGD) WITH PROPOFOL;  Surgeon: Danie Binder, MD;  [facility];  Service: Endoscopy;  Laterality: N/A;   GIVENS CAPSULE STUDY N/A 06/15/2019   Procedure: GIVENS CAPSULE STUDY;  Surgeon: Danie Binder, MD;  [facility];  Service: Endoscopy;  Laterality: N/A;  7:30am   HALLUX VALGUS CORRECTION     bilateral foot surgery for bone repairs-multiple   LOBECTOMY Right 10/15/2016   Procedure: RIGHT UPPER LOBECTOMY;  Surgeon: [name], MD;  Location: [facility];  Service: Thoracic;  Laterality: Right;   LYMPH NODE DISSECTION Right 10/15/2016   Procedure: LYMPH NODE DISSECTION;  Surgeon: [name], MD;  [facility];  Service: Thoracic;  Laterality: Right;   POLYPECTOMY  05/28/2019   Procedure: POLYPECTOMY;  Surgeon: Danie Binder, MD;  [facility];  Service: Endoscopy;;   SAVORY DILATION N/A 05/28/2019   Procedure: SAVORY DILATION;  Surgeon: Danie Binder, MD;  [facility];  Service: Endoscopy;  Laterality: N/A;  15/16/17   VAGINA RECONSTRUCTION SURGERY     tvt 2006- [place] (VATS)/WEDGE RESECTION Right 10/15/2016   Procedure: RIGHT VIDEO ASSISTED THORACOSCOPY (VATS)/WEDGE RESECTION;  Surgeon: [name], MD;  Location: [facility];  Service: Thoracic;  Laterality: Right;    REVIEW OF SYSTEMS:  A comprehensive review of systems was negative.   PHYSICAL EXAMINATION: General appearance: alert, cooperative, and no distress Head: Normocephalic, without obvious abnormality, atraumatic Neck: no adenopathy, no JVD, supple, symmetrical, trachea midline, and thyroid not enlarged, symmetric, no tenderness/mass/nodules Lymph nodes: Cervical, supraclavicular, and axillary nodes normal. Resp: clear to auscultation bilaterally Back: symmetric, no curvature. ROM normal. No CVA tenderness. Cardio:  regular rate and rhythm, S1, S2 normal, no murmur, click, rub or gallop GI: soft, non-tender; bowel sounds normal; no masses,  no organomegaly Extremities: extremities normal, atraumatic, no cyanosis or edema  ECOG PERFORMANCE STATUS: 0 - Asymptomatic  Blood pressure (!) 141/72, pulse 62, temperature 98.2 F (36.8 C), temperature source Oral, resp. rate 15, weight 226 lb 12.8 oz (102.9 kg), SpO2 98 %.  LABORATORY DATA: Lab Results  Component Value Date   WBC 9.5 06/12/2022   HGB 15.3 (H) 06/12/2022   HCT 44.0 06/12/2022   MCV 82.2 06/12/2022   PLT 234 06/12/2022      Chemistry      Component Value Date/Time   NA 140 06/12/2022 0908   NA 141 05/29/2017 0921   K 4.0 06/12/2022 0908   K 4.8 05/29/2017 0921   CL 107 06/12/2022 0908   CO2 25 06/12/2022 0908   CO2 28 05/29/2017 0921   BUN 18 06/12/2022 0908   BUN 13.5 05/29/2017 0921   CREATININE 0.76 06/12/2022 0908   CREATININE 0.8 05/29/2017 0921      Component Value Date/Time   CALCIUM 9.2 06/12/2022 0908   CALCIUM 9.5 05/29/2017 0921   ALKPHOS 64 06/12/2022 0908   ALKPHOS 104 05/29/2017 0921   AST 17 06/12/2022 0908   AST 17 05/29/2017 0921   ALT 20 06/12/2022 0908   ALT 20 05/29/2017 0921   BILITOT 0.5 06/12/2022 0908   BILITOT 0.40 05/29/2017 0921       RADIOGRAPHIC STUDIES: CT Chest W Contrast  Result Date: 06/13/2022 CLINICAL DATA:  w non-small cell lung cancer staging in a 66 year old female post RIGHT upper lobectomy and mediastinal lymph node dissection. Insert body on EXAM: CT CHEST WITH CONTRAST TECHNIQUE: Multidetector CT imaging of the chest was performed during intravenous contrast administration. RADIATION DOSE REDUCTION: This exam was performed according to the departmental dose-optimization program which includes automated exposure control, adjustment of the mA and/or kV according to patient size and/or use of iterative reconstruction technique. CONTRAST:  65m OMNIPAQUE IOHEXOL 300 MG/ML  SOLN  COMPARISON:  Cardiac imaging from March of 2023 and CT of the chest comparison of June 08, 2021. FINDINGS: Cardiovascular: Calcified and noncalcified aortic atherosclerotic plaque of the thoracic aorta. No signs of aneurysmal dilation. Mild engorgement of central pulmonary vasculature. Distortion of the RIGHT hilum following RIGHT upper lobectomy which is similar to prior imaging. Heart size stable. No sign of pericardial effusion or nodularity. Mediastinum/Nodes:  Mild circumferential distal esophageal thickening is similar to previous imaging. Esophagus otherwise unremarkable by CT. No signs of adenopathy in the chest. Lungs/Pleura: Post RIGHT upper lobectomy. No effusion. No consolidation. No sign of suspicious pulmonary nodule. Airways are patent. Pleural and parenchymal scarring along the RIGHT lateral chest is similar to previous imaging. There is some new subtle ground-glass in RIGHT lower lobe with mild tree-in-bud nodularity (image 101/5) Upper Abdomen: No acute findings in the upper abdomen. Stable appearance of adrenal nodularity, largest discrete area on the LEFT 1.7 cm and largest discrete area on the RIGHT 1.6 cm. These are homogeneous and unchanged since May of 2020. Musculoskeletal: No acute bone finding. No destructive bone process. Spinal degenerative changes. IMPRESSION: 1. Post RIGHT upper lobectomy. No signs of disease recurrence or metastatic disease. 2. New subtle ground-glass in the RIGHT lower lobe with mild tree-in-bud nodularity. Findings more suggestive of infectious or inflammatory process. Consider short interval follow-up to ensure resolution. 3. Stable appearance of adrenal nodularity, largest discrete area on the LEFT 1.7 cm and largest discrete area on the RIGHT 1.6 cm. These are homogeneous and unchanged since May of 2020. Probable adrenal adenomas without change. 4. Aortic atherosclerosis. 5. Mild circumferential distal esophageal thickening is similar to previous imaging.  Correlate with any symptoms of esophagitis with dedicated esophageal evaluation as warranted. Aortic Atherosclerosis (ICD10-I70.0). Electronically Signed   By: Zetta Bills M.D.   On: 06/13/2022 10:05   DG Knee Complete 4 Views Left  Result Date: 05/15/2022 CLINICAL DATA:  Pain left knee x2 months EXAM: LEFT KNEE - COMPLETE 4+ VIEW COMPARISON:  None Available. FINDINGS: No recent fracture or dislocation is seen. Degenerative changes are noted with bony spurs in medial and patellofemoral compartments. There is narrowing of joint space in the medial compartment. There is no significant effusion. IMPRESSION: No fracture or dislocation is seen. Degenerative changes are noted with bony spurs in medial and patellofemoral compartments. Electronically Signed   By: Elmer Picker M.D.   On: 05/15/2022 15:17     ASSESSMENT AND PLAN: This is a very pleasant 66 years old white female with stage IA non-small cell lung cancer status post right upper lobectomy with lymph node dissection.  This was diagnosed in March 2018. The patient is currently on observation and she is feeling fine today with no concerning complaints. Patient had repeat CT scan of the chest performed recently.  I personally and independently reviewed the scan images and discussed the result with the patient today. Her scan showed no concerning finding except for new groundglass opacity in the right lower lobe with mild tree-in-bud nodularity and likely inflammatory in origin but this is a very small area. I recommended for the patient to continue on observation with repeat CT scan of the chest in 1 year but she was advised to call immediately if she has any other concerning issues in the interval. The patient voices understanding of current disease status and treatment options and is in agreement with the current care plan. All questions were answered. The patient knows to call the clinic with any problems, questions or concerns. We can  certainly see the patient much sooner if necessary.  Disclaimer: This note was dictated with voice recognition software. Similar sounding words can inadvertently be transcribed and may not be corrected upon review.

## 2022-06-25 ENCOUNTER — Telehealth: Payer: Self-pay | Admitting: *Deleted

## 2022-06-25 ENCOUNTER — Other Ambulatory Visit: Payer: Self-pay | Admitting: Nurse Practitioner

## 2022-06-25 MED ORDER — TIRZEPATIDE 12.5 MG/0.5ML ~~LOC~~ SOAJ: 12.5000 mg | SUBCUTANEOUS | 1 refills | Status: DC

## 2022-06-25 NOTE — Telephone Encounter (Signed)
Yes, steroids (both oral or injectable) can cause spikes in glucose but should return to normal once it levels off.  I also did send in script for the Mounjaro 12.5 mg dose.

## 2022-06-25 NOTE — Telephone Encounter (Signed)
Patient was called and given [name]'s recommendation.

## 2022-06-25 NOTE — Telephone Encounter (Signed)
Patient states that she was ask at her last OV to call the office when she was on her last box of Mounjaro. She also request that you look at her blood sugars , as she has had some high readings recently, she also had a shot in her knee recently and questions if this may be the reason.

## 2022-06-26 ENCOUNTER — Telehealth: Payer: Self-pay | Admitting: Medical Oncology

## 2022-06-26 NOTE — Telephone Encounter (Signed)
Faxed CT result to PCP per pt request.

## 2022-08-01 ENCOUNTER — Ambulatory Visit
Admission: RE | Admit: 2022-08-01 | Discharge: 2022-08-01 | Disposition: A | Discharge status: Home / Self Care | Payer: Medicare Other | Source: Ambulatory Visit | Attending: Family Medicine | Admitting: Family Medicine

## 2022-08-01 DIAGNOSIS — Z1231 Encounter for screening mammogram for malignant neoplasm of breast: Secondary | ICD-10-CM

## 2022-08-21 ENCOUNTER — Ambulatory Visit (INDEPENDENT_AMBULATORY_CARE_PROVIDER_SITE_OTHER): Payer: Medicare Other | Admitting: Nurse Practitioner

## 2022-08-21 ENCOUNTER — Encounter: Payer: Self-pay | Admitting: Nurse Practitioner

## 2022-08-21 VITALS — BP 150/80 | HR 87 | Ht 66.5 in | Wt 222.6 lb

## 2022-08-21 DIAGNOSIS — E119 Type 2 diabetes mellitus without complications: Secondary | ICD-10-CM | POA: Diagnosis not present

## 2022-08-21 DIAGNOSIS — E559 Vitamin D deficiency, unspecified: Secondary | ICD-10-CM

## 2022-08-21 DIAGNOSIS — I10 Essential (primary) hypertension: Secondary | ICD-10-CM

## 2022-08-21 DIAGNOSIS — E782 Mixed hyperlipidemia: Secondary | ICD-10-CM

## 2022-08-21 DIAGNOSIS — Z794 Long term (current) use of insulin: Secondary | ICD-10-CM

## 2022-08-21 LAB — POCT GLYCOSYLATED HEMOGLOBIN (HGB A1C): Hemoglobin A1C: 6.2 % — AB (ref 4.0–5.6)

## 2022-08-21 MED ORDER — TIRZEPATIDE 15 MG/0.5ML ~~LOC~~ SOAJ: 15.0000 mg | SUBCUTANEOUS | 3 refills | Status: DC

## 2022-08-21 NOTE — Progress Notes (Signed)
Endocrinology Follow Up Note       08/21/2022, 9:35 AM   Subjective:    Patient ID: [name], female    DOB: 1955-12-22.  [name] is being seen in follow up after being seen in consultation for management of currently uncontrolled symptomatic diabetes requested by  Lujean Amel, MD.   Past Medical History:  Diagnosis Date   Adenocarcinoma of right lung, stage 1 ([place]) 11/29/2016   Cancer ([place])    Chronic pain    Depression    takes Lexapro daily   Diabetes mellitus    takes Metformin daily and has an insulin pump.Fasting blood sugar runs250   GERD (gastroesophageal reflux disease)    takes Pantoprazole daily   History of colon polyps    benign   Hyperlipidemia    takes Atorvastatin daily   Hypertension    takes Amlodipine and Lisinopril daily   Insomnia    takes Trazodone nightly   Nocturia    Thyroid disease    Urinary frequency    Urinary urgency     Past Surgical History:  Procedure Laterality Date   ABDOMINAL HYSTERECTOMY     ABDOMINAL SURGERY     BIOPSY  05/28/2019   Procedure: BIOPSY;  Surgeon: Danie Binder, MD;  [facility];  Service: Endoscopy;;  random colon/gastric/duodenum   COLONOSCOPY  2005 [facility]   TICs, IH   COLONOSCOPY N/A 03/08/2014   Procedure: COLONOSCOPY;  Surgeon: Danie Binder, MD;  [facility];  Service: Endoscopy;  Laterality: N/A;  1015   COLONOSCOPY WITH PROPOFOL N/A 05/28/2019   Procedure: COLONOSCOPY WITH PROPOFOL;  Surgeon: Danie Binder, MD;  [facility];  Service: Endoscopy;  Laterality: N/A;  9:15am   CYSTOSTOMY  11/22/2011   Procedure: CYSTOSTOMY SUPRAPUBIC;  Surgeon: Marissa Nestle, MD;  Location: [facility];  Service: Urology;  Laterality: N/A;   ESOPHAGOGASTRODUODENOSCOPY N/A 03/08/2014   Procedure: ESOPHAGOGASTRODUODENOSCOPY (EGD);  Surgeon: Danie Binder, MD;  [facility];  Service: Endoscopy;   Laterality: N/A;   ESOPHAGOGASTRODUODENOSCOPY     ESOPHAGOGASTRODUODENOSCOPY (EGD) WITH PROPOFOL N/A 05/28/2019   Procedure: ESOPHAGOGASTRODUODENOSCOPY (EGD) WITH PROPOFOL;  Surgeon: Danie Binder, MD;  [facility];  Service: Endoscopy;  Laterality: N/A;   GIVENS CAPSULE STUDY N/A 06/15/2019   Procedure: GIVENS CAPSULE STUDY;  Surgeon: Danie Binder, MD;  [facility];  Service: Endoscopy;  Laterality: N/A;  7:30am   HALLUX VALGUS CORRECTION     bilateral foot surgery for bone repairs-multiple   LOBECTOMY Right 10/15/2016   Procedure: RIGHT UPPER LOBECTOMY;  Surgeon: [name], MD;  Location: [facility];  Service: Thoracic;  Laterality: Right;   LYMPH NODE DISSECTION Right 10/15/2016   Procedure: LYMPH NODE DISSECTION;  Surgeon: [name], MD;  Location: [facility];  Service: Thoracic;  Laterality: Right;   POLYPECTOMY  05/28/2019   Procedure: POLYPECTOMY;  Surgeon: Danie Binder, MD;  [facility];  Service: Endoscopy;;   SAVORY DILATION N/A 05/28/2019   Procedure: SAVORY DILATION;  Surgeon: Danie Binder, MD;  [facility];  Service: Endoscopy;  Laterality: N/A;  15/16/17   VAGINA RECONSTRUCTION SURGERY     tvt 2006- [place] (VATS)/WEDGE RESECTION Right 10/15/2016   Procedure: RIGHT VIDEO ASSISTED THORACOSCOPY (VATS)/WEDGE RESECTION;  Surgeon: [name], MD;  [facility];  Service: Thoracic;  Laterality: Right;    Social History   Socioeconomic History   Marital status: Single    Spouse name: Not on file   Number of children: Not on file   Years of education: Not on file   Highest education level: Not on file  Occupational History   Not on file  Tobacco Use   Smoking status: Former    Packs/day: 1.00    Years: 32.00    Total pack years: 32.00    Types: Cigarettes   Smokeless tobacco: Never   Tobacco comments:    quit smoking 20 yrs ago  Vaping Use   Vaping Use:  Never used  Substance and Sexual Activity   Alcohol use: No    Comment: rarely   Drug use: No   Sexual activity: Not on file  Other Topics Concern   Not on file  Social History Narrative   Not on file   Social Determinants of Health   Financial Resource Strain: Not on file  Food Insecurity: Not on file  Transportation Needs: Not on file  Physical Activity: Not on file  Stress: Not on file  Social Connections: Not on file    Family History  Problem Relation Age of Onset   Asthma Other    Diabetes Other    Arthritis Other    Cancer Mother    Cancer Brother    Anesthesia problems Neg Hx    Hypotension Neg Hx    Malignant hyperthermia Neg Hx    Pseudochol deficiency Neg Hx    Colon polyps Neg Hx    Colon cancer Neg Hx    Celiac disease Neg Hx    Pancreatic cancer Neg Hx    Stomach cancer Neg Hx    Ulcerative colitis Neg Hx    Crohn's disease Neg Hx     Outpatient Encounter Medications as of 08/21/2022  Medication Sig   ACCU-CHEK AVIVA PLUS test strip USE TO MONITOR GLUCOSE LEVELS TWICE A DAY E11.9   ACCU-CHEK SOFTCLIX LANCETS lancets Use to monitor glucose levels BID; E11.9 (Patient taking differently: 1 each by Other route 2 (two) times daily. E11.9)   acetaminophen (TYLENOL) 500 MG tablet Take 1 tablet (500 mg total) by mouth every 6 (six) hours as needed. (Patient taking differently: Take 500 mg by mouth every 6 (six) hours as needed (for pain).)   atorvastatin (LIPITOR) 20 MG tablet Take 20 mg by mouth daily.   benzonatate (TESSALON) 100 MG capsule Take 1 capsule (100 mg total) by mouth every 8 (eight) hours.   Blood Glucose Calibration (GLUCOSE CONTROL) SOLN 1 Bottle by In Vitro route as needed. Use to calibrate Accu-Chek Aviva Plus device prn; please provide control solution compatible with Accu-Chek Aviva Plus device. (Patient taking differently: 1 each by Other route as needed (Use to calibrate glucometer). E11.9)   Blood Glucose Monitoring Suppl (ACCU-CHEK AVIVA  PLUS) w/Device KIT 1 each by Does not apply route 2 (two) times daily. Use to monitor glucose levels BID; E11.9 (Patient taking differently: 1 each by Does not apply route 2 (two) times daily. E11.9)   cetirizine (ZYRTEC ALLERGY) 10 MG tablet Take 1 tablet (10 mg total) by mouth daily.   chlorthalidone (HYGROTON) 25 MG tablet  Take 12.5 mg by mouth daily.    diclofenac Sodium (VOLTAREN ARTHRITIS PAIN) 1 % GEL Apply 4 g topically 4 (four) times daily.   escitalopram (LEXAPRO) 10 MG tablet Take 1 tablet (10 mg total) by mouth every morning.   ibuprofen (ADVIL) 800 MG tablet Take 800 mg by mouth every 8 (eight) hours as needed.   insulin aspart (NOVOLOG FLEXPEN) 100 UNIT/ML FlexPen Inject 6-12 Units into the skin 3 (three) times daily with meals.   insulin glargine (LANTUS SOLOSTAR) 100 UNIT/ML Solostar Pen Inject 20 Units into the skin daily.   Insulin Pen Needle (PEN NEEDLES) 31G X 8 MM MISC Use As Directed   lisinopril (ZESTRIL) 10 MG tablet Take 10 mg by mouth daily.   metFORMIN (GLUCOPHAGE-XR) 500 MG 24 hr tablet Take 1 tablet (500 mg total) by mouth daily with breakfast.   pantoprazole (PROTONIX) 40 MG tablet 1 PO 30 MINS BEFORE YOUR FIRST AND LAST MEAL.   promethazine (PHENERGAN) 12.5 MG tablet 1-2 po q4-6 h prn nausea or vomiting   tirzepatide (MOUNJARO) 15 MG/0.5ML Pen Inject 15 mg into the skin once a week.   traZODone (DESYREL) 100 MG tablet Take 2 tablets (200 mg total) by mouth at bedtime.   triamcinolone cream (KENALOG) 0.1 % Apply 1 application topically 2 (two) times daily.   [DISCONTINUED] tirzepatide (MOUNJARO) 12.5 MG/0.5ML Pen Inject 12.5 mg into the skin once a week.   cyclobenzaprine (FLEXERIL) 10 MG tablet Take 1 tablet (10 mg total) by mouth 2 (two) times daily as needed for muscle spasms.   fluticasone (FLONASE) 50 MCG/ACT nasal spray Place 1 spray into both nostrils daily for 14 days.   No facility-administered encounter medications on file as of 08/21/2022.     ALLERGIES: Allergies  Allergen Reactions   Synthroid [Levothyroxine Sodium] Anaphylaxis   Hydrochlorothiazide     Other reaction(s): cramps   Iodine Rash    I get a skin rash sometimes if iodine applied to my skin.      VACCINATION STATUS:  There is no immunization history on file for this patient.  Diabetes She presents for her follow-up diabetic visit. She has type 2 diabetes mellitus. Onset time: diagnosed at approx age of 29. Her disease course has been stable. There are no hypoglycemic associated symptoms. There are no diabetic associated symptoms. There are no hypoglycemic complications. There are no diabetic complications. Risk factors for coronary artery disease include diabetes mellitus, obesity, hypertension and post-menopausal. Current diabetic treatment includes intensive insulin program and oral agent (monotherapy) (and Mounjaro). She is compliant with treatment all of the time. Her weight is decreasing steadily. She is following a generally healthy diet. Meal planning includes avoidance of concentrated sweets. She has not had a previous visit with a dietitian. She participates in exercise daily. Her home blood glucose trend is decreasing steadily. (She presents today with her CGM showing mostly at goal glycemic profile.  Her POCT A1c today is 6.2%, unchanged from previous visit.  She notes she has had steroid injections since last visit.  She also notes that she has not had to take her Novolog recently due to not meeting criteria as glucose well managed.  ) An ACE inhibitor/angiotensin II receptor blocker is being taken. She sees a podiatrist.Eye exam is current.     Review of systems  Constitutional: + steadily decreasing body weight,  current Body mass index is 35.39 kg/m. , no fatigue, no subjective hyperthermia, no subjective hypothermia Eyes: no blurry vision, no xerophthalmia ENT: no  sore throat, no nodules palpated in throat, no dysphagia/odynophagia, no  hoarseness Cardiovascular: no chest pain, no shortness of breath, no palpitations, no leg swelling Respiratory: no cough, no shortness of breath Gastrointestinal: no nausea/vomiting/diarrhea Musculoskeletal: scattered joint pain- recently had steroid injection in neck Skin: no rashes, no hyperemia Neurological: no tremors, no numbness, no tingling, no dizziness Psychiatric: no depression, no anxiety  Objective:     BP (!) 150/80 Comment: Retaken with manuel cuff - patient states that she has not taken her BP meds this morning  Pulse 87   Ht 5' 6.5" (1.689 m)   Wt 222 lb 9.6 oz (101 kg)   BMI 35.39 kg/m   Wt Readings from Last 3 Encounters:  08/21/22 222 lb 9.6 oz (101 kg)  06/14/22 226 lb 12.8 oz (102.9 kg)  05/17/22 227 lb 6.4 oz (103.1 kg)     BP Readings from Last 3 Encounters:  08/21/22 (!) 150/80  06/14/22 (!) 141/72  05/17/22 (!) 140/92      Physical Exam- Limited  Constitutional:  Body mass index is 35.39 kg/m. , not in acute distress, normal state of mind Eyes:  EOMI, no exophthalmos Musculoskeletal: no gross deformities, strength intact in all four extremities, no gross restriction of joint movements Skin:  no rashes, no hyperemia Neurological: no tremor with outstretched hands  Diabetic Foot Exam - Simple   Simple Foot Form Diabetic Foot exam was performed with the following findings: Yes 08/21/2022  9:31 AM  Visual Inspection No deformities, no ulcerations, no other skin breakdown bilaterally: Yes Sensation Testing Intact to touch and monofilament testing bilaterally: Yes Pulse Check Posterior Tibialis and Dorsalis pulse intact bilaterally: Yes Comments     CMP ( most recent) CMP     Component Value Date/Time   NA 140 06/12/2022 0908   NA 141 05/29/2017 0921   K 4.0 06/12/2022 0908   K 4.8 05/29/2017 0921   CL 107 06/12/2022 0908   CO2 25 06/12/2022 0908   CO2 28 05/29/2017 0921   GLUCOSE 111 (H) 06/12/2022 0908   GLUCOSE 184 (H) 05/29/2017  0921   BUN 18 06/12/2022 0908   BUN 13.5 05/29/2017 0921   CREATININE 0.76 06/12/2022 0908   CREATININE 0.8 05/29/2017 0921   CALCIUM 9.2 06/12/2022 0908   CALCIUM 9.5 05/29/2017 0921   PROT 6.7 06/12/2022 0908   PROT 7.1 05/29/2017 0921   ALBUMIN 4.1 06/12/2022 0908   ALBUMIN 80 mg/L 05/17/2022 0922   ALBUMIN 3.6 05/29/2017 0921   AST 17 06/12/2022 0908   AST 17 05/29/2017 0921   ALT 20 06/12/2022 0908   ALT 20 05/29/2017 0921   ALKPHOS 64 06/12/2022 0908   ALKPHOS 104 05/29/2017 0921   BILITOT 0.5 06/12/2022 0908   BILITOT 0.40 05/29/2017 0921   GFRNONAA >60 06/12/2022 0908   GFRAA >60 12/08/2019 0849     Diabetic Labs (most recent): Lab Results  Component Value Date   HGBA1C 6.2 (A) 08/21/2022   HGBA1C 6.2 (A) 05/17/2022   HGBA1C 6.7 (A) 09/26/2021     Lipid Panel ( most recent) Lipid Panel  No results found for: "CHOL", "TRIG", "HDL", "CHOLHDL", "VLDL", "LDLCALC", "LDLDIRECT", "LABVLDL"    Lab Results  Component Value Date   TSH 2.93 07/11/2016           Assessment & Plan:   1) Type 2 diabetes mellitus without complication, with long-term current use of insulin ([place])  She presents today with her CGM showing mostly at goal glycemic profile.  Her POCT A1c today is 6.2%, unchanged from previous visit.  She notes she has had steroid injections since last visit.  She also notes that she has not had to take her Novolog recently due to not meeting criteria as glucose well managed.     - WAVIE HASHIMI has currently uncontrolled symptomatic type 2 DM since 67 years of age.   -Recent labs reviewed.  - I had a long discussion with her about the progressive nature of diabetes and the pathology behind its complications. -her diabetes is not currently complicated but she remains at a high risk for more acute and chronic complications which include CAD, CVA, CKD, retinopathy, and neuropathy. These are all discussed in detail with her.  The following Lifestyle  Medicine recommendations according to [facility]) were discussed and offered to patient and she agrees to start the journey:  A. Whole Foods, Plant-based plate comprising of fruits and vegetables, plant-based proteins, whole-grain carbohydrates was discussed in detail with the patient.   A list for source of those nutrients were also provided to the patient.  Patient will use only water or unsweetened tea for hydration. B.  The need to stay away from risky substances including alcohol, smoking; obtaining 7 to 9 hours of restorative sleep, at least 150 minutes of moderate intensity exercise weekly, the importance of healthy social connections,  and stress reduction techniques were discussed. C.  A full color page of  Calorie density of various food groups per pound showing examples of each food groups was provided to the patient.  - Nutritional counseling repeated at each appointment due to patients tendency to fall back in to old habits.  - The patient admits there is a room for improvement in their diet and drink choices. -  Suggestion is made for the patient to avoid simple carbohydrates from their diet including Cakes, Sweet Desserts / Pastries, Ice Cream, Soda (diet and regular), Sweet Tea, Candies, Chips, Cookies, Sweet Pastries, Store Bought Juices, Alcohol in Excess of 1-2 drinks a day, Artificial Sweeteners, Coffee Creamer, and "Sugar-free" Products. This will help patient to have stable blood glucose profile and potentially avoid unintended weight gain.   - I encouraged the patient to switch to unprocessed or minimally processed complex starch and increased protein intake (animal or plant source), fruits, and vegetables.   - Patient is advised to stick to a routine mealtimes to eat 3 meals a day and avoid unnecessary snacks (to snack only to correct hypoglycemia).  - I have approached her with the following individualized plan to manage her diabetes and  patient agrees:   -She is advised to continue her Lantus 20 units SQ but to start taking it at night.  Will stop her Novolog given her controlled readings.  Will increase her Mounjaro to 15 mg SQ weekly after she finishes her current supply and continue Metformin 500 mg ER daily with breakfast for now.   -she is encouraged to start monitoring glucose 4 times daily (using her CGM), before meals and before bed, and to call the clinic if she has readings less than 70 or above 300 for 3 tests in a row.  - she is warned not to take insulin without proper monitoring per orders. - Adjustment parameters are given to her for hypo and hyperglycemia in writing.  - her Parlodel and Wilder Glade was discontinued previously, due to side effect profile and to deescalate her treatment plan.   - Specific targets for  A1c;  LDL, HDL, and Triglycerides were discussed with the patient.  2) Blood Pressure /Hypertension:  her blood pressure is controlled to target.   she is advised to continue her current medications including Lisinopril 10 mg p.o. daily with breakfast, Chlorthalidone 25 mg po daily.  3) Lipids/Hyperlipidemia:    There is no recent lipid panel available to review .  she is advised to continue Lipitor 20 mg daily at bedtime.  Side effects and precautions discussed with her.  She had labs recently by PCP, she will call and request copy to be sent here for our records.  4)  Weight/Diet:  her Body mass index is 35.39 kg/m.  -  clearly complicating her diabetes care.   she is a candidate for weight loss. I discussed with her the fact that loss of 5 - 10% of her  current body weight will have the most impact on her diabetes management.  Exercise, and detailed carbohydrates information provided  -  detailed on discharge instructions.  5) Chronic Care/Health Maintenance: -she is on ACEI/ARB and Statin medications and is encouraged to initiate and continue to follow up with Ophthalmology, Dentist, Podiatrist at  least yearly or according to recommendations, and advised to stay away from smoking. I have recommended yearly flu vaccine and pneumonia vaccine at least every 5 years; moderate intensity exercise for up to 150 minutes weekly; and sleep for at least 7 hours a day.  - she is advised to maintain close follow up with Lujean Amel, MD for primary care needs, as well as her other providers for optimal and coordinated care.     I spent 36 minutes in the care of the patient today including review of labs from [facility], Lipids, Thyroid Function, Hematology (current and previous including abstractions from other facilities); face-to-face time discussing  her blood glucose readings/logs, discussing hypoglycemia and hyperglycemia episodes and symptoms, medications doses, her options of short and long term treatment based on the latest standards of care / guidelines;  discussion about incorporating lifestyle medicine;  and documenting the encounter. Risk reduction counseling performed per USPSTF guidelines to reduce obesity and cardiovascular risk factors.     Please refer to Patient Instructions for Blood Glucose Monitoring and Insulin/Medications Dosing Guide"  in media tab for additional information. Please  also refer to " Patient Self Inventory" in the Media  tab for reviewed elements of pertinent patient history.  Salvadore Oxford participated in the discussions, expressed understanding, and voiced agreement with the above plans.  All questions were answered to her satisfaction. she is encouraged to contact clinic should she have any questions or concerns prior to her return visit.     Follow up plan: - Return in about 3 months (around 11/20/2022) for Diabetes F/U with A1c in office, Previsit labs, Bring meter and logs.   Rayetta Pigg, [facility] [address] [place],  [address] Phone: [phone] Fax: [phone]  08/21/2022, 9:35 AM

## 2022-09-13 ENCOUNTER — Telehealth (INDEPENDENT_AMBULATORY_CARE_PROVIDER_SITE_OTHER): Payer: Medicare Other | Admitting: Psychiatry

## 2022-09-13 ENCOUNTER — Encounter (HOSPITAL_COMMUNITY): Payer: Self-pay | Admitting: Psychiatry

## 2022-09-13 DIAGNOSIS — F331 Major depressive disorder, recurrent, moderate: Secondary | ICD-10-CM

## 2022-09-13 MED ORDER — ESCITALOPRAM OXALATE 10 MG PO TABS: 10.0000 mg | ORAL_TABLET | Freq: Every morning | ORAL | 3 refills | Status: DC

## 2022-09-13 MED ORDER — TRAZODONE HCL 100 MG PO TABS: 200.0000 mg | ORAL_TABLET | Freq: Every day | ORAL | 3 refills | Status: DC

## 2022-09-13 NOTE — Progress Notes (Signed)
Virtual Visit via Video Note  I connected with [name] on 09/13/22 at  9:40 AM EST by a video enabled telemedicine application and verified that I am speaking with the correct person using two identifiers.  Location: Patient: home Provider: office   I discussed the limitations of evaluation and management by telemedicine and the availability of in person appointments. The patient expressed understanding and agreed to proceed.     I discussed the assessment and treatment plan with the patient. The patient was provided an opportunity to ask questions and all were answered. The patient agreed with the plan and demonstrated an understanding of the instructions.   The patient was advised to call back or seek an in-person evaluation if the symptoms worsen or if the condition fails to improve as anticipated.  I provided 12 minutes of non-face-to-face time during this encounter.   Levonne Spiller, MD  [facility] MD/PA/NP OP Progress Note  09/13/2022 9:49 AM [name]  MRN:  [id number]  Chief Complaint:  Chief Complaint  Patient presents with   Depression   Anxiety   Follow-up   HPI: This patient is a 67 year old single white female who lives alone in [place].  She is retired as a Recruitment consultant.  She is originally from [facility]   The patient returns for follow-up after 6 months regarding her depression and anxiety.  She continues to do very well.  She is staying active with all her volunteer work and church activities.  She also walks 4 miles every day.  She has been able to get her A1c down to 6.2.  She has been able to get off a couple of her diabetic medications.  Her mood is good and she denies significant depression anxiety or difficulty sleeping.  She denies nightmares or flashbacks Visit Diagnosis:    ICD-10-CM   1. Major depressive disorder, recurrent episode, moderate (HCC)  F33.1       Past Psychiatric History: none  Past Medical History:  Past  Medical History:  Diagnosis Date   Adenocarcinoma of right lung, stage 1 (HCC) 11/29/2016   Cancer (HCC)    Chronic pain    Depression    takes Lexapro daily   Diabetes mellitus    takes Metformin daily and has an insulin pump.Fasting blood sugar runs250   GERD (gastroesophageal reflux disease)    takes Pantoprazole daily   History of colon polyps    benign   Hyperlipidemia    takes Atorvastatin daily   Hypertension    takes Amlodipine and Lisinopril daily   Insomnia    takes Trazodone nightly   Nocturia    Thyroid disease    Urinary frequency    Urinary urgency     Past Surgical History:  Procedure Laterality Date   ABDOMINAL HYSTERECTOMY     ABDOMINAL SURGERY     BIOPSY  05/28/2019   Procedure: BIOPSY;  Surgeon: Danie Binder, MD;  [facility];  Service: Endoscopy;;  random colon/gastric/duodenum   COLONOSCOPY  2005 MAC   TICs, IH   COLONOSCOPY N/A 03/08/2014   Procedure: COLONOSCOPY;  Surgeon: Danie Binder, MD;  [facility];  Service: Endoscopy;  Laterality: N/A;  1015   COLONOSCOPY WITH PROPOFOL N/A 05/28/2019   Procedure: COLONOSCOPY WITH PROPOFOL;  Surgeon: Danie Binder, MD;  [facility];  Service: Endoscopy;  Laterality: N/A;  9:15am   CYSTOSTOMY  11/22/2011   Procedure: CYSTOSTOMY SUPRAPUBIC;  Surgeon: Marissa Nestle,  MD;  [facility]: [facility];  Service: Urology;  Laterality: N/A;   ESOPHAGOGASTRODUODENOSCOPY N/A 03/08/2014   Procedure: ESOPHAGOGASTRODUODENOSCOPY (EGD);  Surgeon: Danie Binder, MD;  [facility];  Service: Endoscopy;  Laterality: N/A;   ESOPHAGOGASTRODUODENOSCOPY     ESOPHAGOGASTRODUODENOSCOPY (EGD) WITH PROPOFOL N/A 05/28/2019   Procedure: ESOPHAGOGASTRODUODENOSCOPY (EGD) WITH PROPOFOL;  Surgeon: Danie Binder, MD;  [facility];  Service: Endoscopy;  Laterality: N/A;   GIVENS CAPSULE STUDY N/A 06/15/2019   Procedure: GIVENS CAPSULE STUDY;  Surgeon: Danie Binder, MD;  [facility];  Service: Endoscopy;  Laterality: N/A;  7:30am   HALLUX VALGUS CORRECTION     bilateral foot surgery for bone repairs-multiple   LOBECTOMY Right 10/15/2016   Procedure: RIGHT UPPER LOBECTOMY;  Surgeon: [name], MD;  Location: [place];  Service: Thoracic;  Laterality: Right;   LYMPH NODE DISSECTION Right 10/15/2016   Procedure: LYMPH NODE DISSECTION;  Surgeon: [name], MD;  Location: [place];  Service: Thoracic;  Laterality: Right;   POLYPECTOMY  05/28/2019   Procedure: POLYPECTOMY;  Surgeon: Danie Binder, MD;  [facility];  Service: Endoscopy;;   SAVORY DILATION N/A 05/28/2019   Procedure: SAVORY DILATION;  Surgeon: Danie Binder, MD;  [facility];  Service: Endoscopy;  Laterality: N/A;  15/16/17   VAGINA RECONSTRUCTION SURGERY     tvt 2006- [place] (VATS)/WEDGE RESECTION Right 10/15/2016   Procedure: RIGHT VIDEO ASSISTED THORACOSCOPY (VATS)/WEDGE RESECTION;  Surgeon: [name], MD;  Location: [place];  Service: Thoracic;  Laterality: Right;    Family Psychiatric History: See below  Family History:  Family History  Problem Relation Age of Onset   Asthma Other    Diabetes Other    Arthritis Other    Cancer Mother    Cancer Brother    Anesthesia problems Neg Hx    Hypotension Neg Hx    Malignant hyperthermia Neg Hx    Pseudochol deficiency Neg Hx    Colon polyps Neg Hx    Colon cancer Neg Hx    Celiac disease Neg Hx    Pancreatic cancer Neg Hx    Stomach cancer Neg Hx    Ulcerative colitis Neg Hx    Crohn's disease Neg Hx     Social History:  Social History   Socioeconomic History   Marital status: Single    Spouse name: Not on file   Number of children: Not on file   Years of education: Not on file   Highest education level: Not on file  Occupational History   Not on file  Tobacco Use   Smoking status: Former    Packs/day: 1.00    Years: 32.00    Total pack  years: 32.00    Types: Cigarettes   Smokeless tobacco: Never   Tobacco comments:    quit smoking 20 yrs ago  Vaping Use   Vaping Use: Never used  Substance and Sexual Activity   Alcohol use: No    Comment: rarely   Drug use: No   Sexual activity: Not on file  Other Topics Concern   Not on file  Social History Narrative   Not on file   Social Determinants of Health   Financial Resource Strain: Not on file  Food Insecurity: Not on file  Transportation Needs: Not on file  Physical Activity: Not on file  Stress: Not on file  Social Connections: Not  on file    Allergies:  Allergies  Allergen Reactions   Synthroid [Levothyroxine Sodium] Anaphylaxis   Hydrochlorothiazide     Other reaction(s): cramps   Iodine Rash    I get a skin rash sometimes if iodine applied to my skin.      Metabolic Disorder Labs: Lab Results  Component Value Date   HGBA1C 6.2 (A) 08/21/2022   MPG 200 10/11/2016   No results found for: "PROLACTIN" No results found for: "CHOL", "TRIG", "HDL", "CHOLHDL", "VLDL", "LDLCALC" Lab Results  Component Value Date   TSH 2.93 07/11/2016    Therapeutic Level Labs: No results found for: "LITHIUM" No results found for: "VALPROATE" No results found for: "CBMZ"  Current Medications: Current Outpatient Medications  Medication Sig Dispense Refill   ACCU-CHEK AVIVA PLUS test strip USE TO MONITOR GLUCOSE LEVELS TWICE A DAY E11.9 100 strip 12   ACCU-CHEK SOFTCLIX LANCETS lancets Use to monitor glucose levels BID; E11.9 (Patient taking differently: 1 each by Other route 2 (two) times daily. E11.9) 100 each 12   acetaminophen (TYLENOL) 500 MG tablet Take 1 tablet (500 mg total) by mouth every 6 (six) hours as needed. (Patient taking differently: Take 500 mg by mouth every 6 (six) hours as needed (for pain).) 30 tablet 0   atorvastatin (LIPITOR) 20 MG tablet Take 20 mg by mouth daily.  11   benzonatate (TESSALON) 100 MG capsule Take 1 capsule (100 mg total) by  mouth every 8 (eight) hours. 30 capsule 0   Blood Glucose Calibration (GLUCOSE CONTROL) SOLN 1 Bottle by In Vitro route as needed. Use to calibrate Accu-Chek Aviva Plus device prn; please provide control solution compatible with Accu-Chek Aviva Plus device. (Patient taking differently: 1 each by Other route as needed (Use to calibrate glucometer). E11.9) 1 each 1   Blood Glucose Monitoring Suppl (ACCU-CHEK AVIVA PLUS) w/Device KIT 1 each by Does not apply route 2 (two) times daily. Use to monitor glucose levels BID; E11.9 (Patient taking differently: 1 each by Does not apply route 2 (two) times daily. E11.9) 1 kit 0   cetirizine (ZYRTEC ALLERGY) 10 MG tablet Take 1 tablet (10 mg total) by mouth daily. 30 tablet 0   chlorthalidone (HYGROTON) 25 MG tablet Take 12.5 mg by mouth daily.      diclofenac Sodium (VOLTAREN ARTHRITIS PAIN) 1 % GEL Apply 4 g topically 4 (four) times daily. 150 g 0   escitalopram (LEXAPRO) 10 MG tablet Take 1 tablet (10 mg total) by mouth every morning. 90 tablet 3   fluticasone (FLONASE) 50 MCG/ACT nasal spray Place 1 spray into both nostrils daily for 14 days. 16 g 0   ibuprofen (ADVIL) 800 MG tablet Take 800 mg by mouth every 8 (eight) hours as needed.     insulin aspart (NOVOLOG FLEXPEN) 100 UNIT/ML FlexPen Inject 6-12 Units into the skin 3 (three) times daily with meals. 30 mL 3   insulin glargine (LANTUS SOLOSTAR) 100 UNIT/ML Solostar Pen Inject 20 Units into the skin daily. 18 mL 3   Insulin Pen Needle (PEN NEEDLES) 31G X 8 MM MISC Use As Directed 100 each 5   lisinopril (ZESTRIL) 10 MG tablet Take 10 mg by mouth daily.     metFORMIN (GLUCOPHAGE-XR) 500 MG 24 hr tablet Take 1 tablet (500 mg total) by mouth daily with breakfast. 90 tablet 3   pantoprazole (PROTONIX) 40 MG tablet 1 PO 30 MINS BEFORE YOUR FIRST AND LAST MEAL. 60 tablet 5   promethazine (PHENERGAN) 12.5 MG  tablet 1-2 po q4-6 h prn nausea or vomiting 30 tablet 1   tirzepatide (MOUNJARO) 15 MG/0.5ML Pen Inject  15 mg into the skin once a week. 6 mL 3   traZODone (DESYREL) 100 MG tablet Take 2 tablets (200 mg total) by mouth at bedtime. 180 tablet 3   triamcinolone cream (KENALOG) 0.1 % Apply 1 application topically 2 (two) times daily. 30 g 0   No current facility-administered medications for this visit.     Musculoskeletal: Strength & Muscle Tone: within normal limits Gait & Station: normal Patient leans: N/A  Psychiatric Specialty Exam: Review of Systems  All other systems reviewed and are negative.   There were no vitals taken for this visit.There is no height or weight on file to calculate BMI.  General Appearance: Casual and Fairly Groomed  Eye Contact:  Good  Speech:  Clear and Coherent  Volume:  Normal  Mood:  Euthymic  Affect:  Congruent  Thought Process:  Goal Directed  Orientation:  Full (Time, Place, and Person)  Thought Content: WDL   Suicidal Thoughts:  No  Homicidal Thoughts:  No  Memory:  Immediate;   Good Recent;   Good Remote;   Good  Judgement:  Good  Insight:  Good  Psychomotor Activity:  Normal  Concentration:  Concentration: Good and Attention Span: Good  Recall:  Good  Fund of Knowledge: Good  Language: Good  Akathisia:  No  Handed:  Right  AIMS (if indicated): not done  Assets:  Communication Skills Desire for Improvement Physical Health Resilience Social Support Talents/Skills  ADL's:  Intact  Cognition: WNL  Sleep:  Good   Screenings: PHQ2-9    Flowsheet Row Video Visit from 03/12/2022 in [place] at [place] Video Visit from 09/14/2021 in [place] at [place] Video Visit from 04/21/2021 in [facility] at [place] Video Visit from 10/05/2020 in [place] at [facility] ([facility] Total Score 0 0 0 0      Flowsheet Row ED from 05/15/2022 in [facility] at [place] Video Visit from 03/12/2022 in [place] at [place] Video Visit from 09/14/2021 in [place] at [place] No Risk No Risk No Risk        Assessment and Plan: This patient is a 67 year old with a history of depression and anxiety.  She continues to do well on her current regimen.  She will continue Lexapro 10 mg daily for depression and trazodone 200 mg at bedtime for sleep.  She will return to see me in 6 months  Collaboration of Care: Collaboration of Care: Primary Care Provider AEB notes will be shared with PCP at patient's request  Patient/Guardian was advised Release of Information must be obtained prior to any record release in order to collaborate their care with an outside provider. Patient/Guardian was advised if they have not already done so to contact the registration department to sign all necessary forms in order for [place] to release information regarding their care.   Consent: Patient/Guardian gives verbal consent for treatment and assignment of benefits for services provided during this visit. Patient/Guardian expressed understanding and agreed to proceed.    Levonne Spiller, MD 09/13/2022, 9:49 AM

## 2022-09-27 ENCOUNTER — Encounter: Payer: Self-pay | Admitting: Radiology

## 2022-10-30 LAB — COMPREHENSIVE METABOLIC PANEL
ALT: 17 IU/L (ref 0–32)
AST: 17 IU/L (ref 0–40)
Albumin/Globulin Ratio: 2.3 — ABNORMAL HIGH (ref 1.2–2.2)
Albumin: 4.3 g/dL (ref 3.9–4.9)
Alkaline Phosphatase: 90 IU/L (ref 44–121)
BUN/Creatinine Ratio: 26 (ref 12–28)
BUN: 21 mg/dL (ref 8–27)
Bilirubin Total: 0.4 mg/dL (ref 0.0–1.2)
CO2: 23 mmol/L (ref 20–29)
Calcium: 9.6 mg/dL (ref 8.7–10.3)
Chloride: 104 mmol/L (ref 96–106)
Creatinine, Ser: 0.82 mg/dL (ref 0.57–1.00)
Globulin, Total: 1.9 g/dL (ref 1.5–4.5)
Glucose: 103 mg/dL — ABNORMAL HIGH (ref 70–99)
Potassium: 4.5 mmol/L (ref 3.5–5.2)
Sodium: 142 mmol/L (ref 134–144)
Total Protein: 6.2 g/dL (ref 6.0–8.5)
eGFR: 78 mL/min/{1.73_m2} (ref >59)

## 2022-10-30 LAB — LIPID PANEL
Chol/HDL Ratio: 2.4 ratio (ref 0.0–4.4)
Cholesterol, Total: 146 mg/dL (ref 100–199)
HDL: 62 mg/dL (ref >39)
LDL Chol Calc (NIH): 64 mg/dL (ref 0–99)
Triglycerides: 113 mg/dL (ref 0–149)
VLDL Cholesterol Cal: 20 mg/dL (ref 5–40)

## 2022-10-30 LAB — TSH: TSH: 0.871 u[IU]/mL (ref 0.450–4.500)

## 2022-10-30 LAB — VITAMIN D 25 HYDROXY (VIT D DEFICIENCY, FRACTURES): Vit D, 25-Hydroxy: 19.5 ng/mL — ABNORMAL LOW (ref 30.0–100.0)

## 2022-10-30 LAB — T4, FREE: Free T4: 1.22 ng/dL (ref 0.82–1.77)

## 2022-11-21 ENCOUNTER — Encounter: Payer: Self-pay | Admitting: Nurse Practitioner

## 2022-11-21 ENCOUNTER — Ambulatory Visit (INDEPENDENT_AMBULATORY_CARE_PROVIDER_SITE_OTHER): Payer: Medicare Other | Admitting: Nurse Practitioner

## 2022-11-21 VITALS — BP 122/68 | HR 50 | Ht 66.5 in | Wt 211.8 lb

## 2022-11-21 DIAGNOSIS — E119 Type 2 diabetes mellitus without complications: Secondary | ICD-10-CM

## 2022-11-21 DIAGNOSIS — E559 Vitamin D deficiency, unspecified: Secondary | ICD-10-CM | POA: Diagnosis not present

## 2022-11-21 DIAGNOSIS — Z794 Long term (current) use of insulin: Secondary | ICD-10-CM

## 2022-11-21 DIAGNOSIS — E782 Mixed hyperlipidemia: Secondary | ICD-10-CM | POA: Diagnosis not present

## 2022-11-21 DIAGNOSIS — I10 Essential (primary) hypertension: Secondary | ICD-10-CM

## 2022-11-21 LAB — POCT GLYCOSYLATED HEMOGLOBIN (HGB A1C): Hemoglobin A1C: 6.6 % — AB (ref 4.0–5.6)

## 2022-11-21 MED ORDER — VITAMIN D (ERGOCALCIFEROL) 1.25 MG (50000 UNIT) PO CAPS: 50000.0000 [IU] | ORAL_CAPSULE | ORAL | 0 refills | Status: DC

## 2022-11-21 MED ORDER — LANTUS SOLOSTAR 100 UNIT/ML ~~LOC~~ SOPN: 25.0000 [IU] | PEN_INJECTOR | Freq: Every day | SUBCUTANEOUS | 3 refills | Status: DC

## 2022-11-21 NOTE — Progress Notes (Signed)
Endocrinology Follow Up Note       11/21/2022, 9:28 AM   Subjective:    Patient ID: [name], female    DOB: 1956-07-27.  [name] is being seen in follow up after being seen in consultation for management of currently uncontrolled symptomatic diabetes requested by  Darrow Bussing, MD.   Past Medical History:  Diagnosis Date   Adenocarcinoma of right lung, stage 1 11/29/2016   Cancer    Chronic pain    Depression    takes Lexapro daily   Diabetes mellitus    takes Metformin daily and has an insulin pump.Fasting blood sugar runs250   GERD (gastroesophageal reflux disease)    takes Pantoprazole daily   History of colon polyps    benign   Hyperlipidemia    takes Atorvastatin daily   Hypertension    takes Amlodipine and Lisinopril daily   Insomnia    takes Trazodone nightly   Nocturia    Thyroid disease    Urinary frequency    Urinary urgency     Past Surgical History:  Procedure Laterality Date   ABDOMINAL HYSTERECTOMY     ABDOMINAL SURGERY     BIOPSY  05/28/2019   Procedure: BIOPSY;  Surgeon: West Bali, MD;  [facility];  Service: Endoscopy;;  random colon/gastric/duodenum   COLONOSCOPY  2005 MAC   TICs, IH   COLONOSCOPY N/A 03/08/2014   Procedure: COLONOSCOPY;  Surgeon: West Bali, MD;  [facility]: [facility];  Service: Endoscopy;  Laterality: N/A;  1015   COLONOSCOPY WITH PROPOFOL N/A 05/28/2019   Procedure: COLONOSCOPY WITH PROPOFOL;  Surgeon: West Bali, MD;  [facility];  Service: Endoscopy;  Laterality: N/A;  9:15am   CYSTOSTOMY  11/22/2011   Procedure: CYSTOSTOMY SUPRAPUBIC;  Surgeon: Ky Barban, MD;  Location: [facility];  Service: Urology;  Laterality: N/A;   ESOPHAGOGASTRODUODENOSCOPY N/A 03/08/2014   Procedure: ESOPHAGOGASTRODUODENOSCOPY (EGD);  Surgeon: West Bali, MD;  [facility];  Service: Endoscopy;  Laterality:  N/A;   ESOPHAGOGASTRODUODENOSCOPY     ESOPHAGOGASTRODUODENOSCOPY (EGD) WITH PROPOFOL N/A 05/28/2019   Procedure: ESOPHAGOGASTRODUODENOSCOPY (EGD) WITH PROPOFOL;  Surgeon: West Bali, MD;  [facility];  Service: Endoscopy;  Laterality: N/A;   GIVENS CAPSULE STUDY N/A 06/15/2019   Procedure: GIVENS CAPSULE STUDY;  Surgeon: West Bali, MD;  [facility];  Service: Endoscopy;  Laterality: N/A;  7:30am   HALLUX VALGUS CORRECTION     bilateral foot surgery for bone repairs-multiple   LOBECTOMY Right 10/15/2016   Procedure: RIGHT UPPER LOBECTOMY;  Surgeon: Loreli Slot, MD;  Location: [facility] OR;  Service: Thoracic;  Laterality: Right;   LYMPH NODE DISSECTION Right 10/15/2016   Procedure: LYMPH NODE DISSECTION;  Surgeon: Loreli Slot, MD;  Location: [facility] OR;  Service: Thoracic;  Laterality: Right;   POLYPECTOMY  05/28/2019   Procedure: POLYPECTOMY;  Surgeon: West Bali, MD;  [facility];  Service: Endoscopy;;   SAVORY DILATION N/A 05/28/2019   Procedure: SAVORY DILATION;  Surgeon: West Bali, MD;  [facility];  Service: Endoscopy;  Laterality: N/A;  15/16/17  VAGINA RECONSTRUCTION SURGERY     tvt 2006- [place]   VIDEO ASSISTED THORACOSCOPY (VATS)/WEDGE RESECTION Right 10/15/2016   Procedure: RIGHT VIDEO ASSISTED THORACOSCOPY (VATS)/WEDGE RESECTION;  Surgeon: Loreli Slot, MD;  Location: [facility];  Service: Thoracic;  Laterality: Right;    Social History   Socioeconomic History   Marital status: Single    Spouse name: Not on file   Number of children: Not on file   Years of education: Not on file   Highest education level: Not on file  Occupational History   Not on file  Tobacco Use   Smoking status: Former    Packs/day: 1.00    Years: 32.00    Additional pack years: 0.00    Total pack years: 32.00    Types: Cigarettes   Smokeless tobacco: Never   Tobacco comments:    quit smoking 20 yrs ago   Vaping Use   Vaping Use: Never used  Substance and Sexual Activity   Alcohol use: No    Comment: rarely   Drug use: No   Sexual activity: Not on file  Other Topics Concern   Not on file  Social History Narrative   Not on file   Social Determinants of Health   Financial Resource Strain: Not on file  Food Insecurity: Not on file  Transportation Needs: Not on file  Physical Activity: Not on file  Stress: Not on file  Social Connections: Not on file    Family History  Problem Relation Age of Onset   Asthma Other    Diabetes Other    Arthritis Other    Cancer Mother    Cancer Brother    Anesthesia problems Neg Hx    Hypotension Neg Hx    Malignant hyperthermia Neg Hx    Pseudochol deficiency Neg Hx    Colon polyps Neg Hx    Colon cancer Neg Hx    Celiac disease Neg Hx    Pancreatic cancer Neg Hx    Stomach cancer Neg Hx    Ulcerative colitis Neg Hx    Crohn's disease Neg Hx     Outpatient Encounter Medications as of 11/21/2022  Medication Sig   ACCU-CHEK AVIVA PLUS test strip USE TO MONITOR GLUCOSE LEVELS TWICE A DAY E11.9   ACCU-CHEK SOFTCLIX LANCETS lancets Use to monitor glucose levels BID; E11.9 (Patient taking differently: 1 each by Other route 2 (two) times daily. E11.9)   acetaminophen (TYLENOL) 500 MG tablet Take 1 tablet (500 mg total) by mouth every 6 (six) hours as needed. (Patient taking differently: Take 500 mg by mouth every 6 (six) hours as needed (for pain).)   atorvastatin (LIPITOR) 20 MG tablet Take 20 mg by mouth daily.   benzonatate (TESSALON) 100 MG capsule Take 1 capsule (100 mg total) by mouth every 8 (eight) hours.   Blood Glucose Calibration (GLUCOSE CONTROL) SOLN 1 Bottle by In Vitro route as needed. Use to calibrate Accu-Chek Aviva Plus device prn; please provide control solution compatible with Accu-Chek Aviva Plus device. (Patient taking differently: 1 each by Other route as needed (Use to calibrate glucometer). E11.9)   Blood Glucose  Monitoring Suppl (ACCU-CHEK AVIVA PLUS) w/Device KIT 1 each by Does not apply route 2 (two) times daily. Use to monitor glucose levels BID; E11.9 (Patient taking differently: 1 each by Does not apply route 2 (two) times daily. E11.9)   cetirizine (ZYRTEC ALLERGY) 10 MG tablet Take 1 tablet (10 mg total) by mouth daily.   chlorthalidone (  HYGROTON) 25 MG tablet Take 12.5 mg by mouth daily.    diclofenac Sodium (VOLTAREN ARTHRITIS PAIN) 1 % GEL Apply 4 g topically 4 (four) times daily.   escitalopram (LEXAPRO) 10 MG tablet Take 1 tablet (10 mg total) by mouth every morning.   ibuprofen (ADVIL) 800 MG tablet Take 800 mg by mouth every 8 (eight) hours as needed.   Insulin Pen Needle (PEN NEEDLES) 31G X 8 MM MISC Use As Directed   lisinopril (ZESTRIL) 10 MG tablet Take 10 mg by mouth daily.   metFORMIN (GLUCOPHAGE-XR) 500 MG 24 hr tablet Take 1 tablet (500 mg total) by mouth daily with breakfast.   pantoprazole (PROTONIX) 40 MG tablet 1 PO 30 MINS BEFORE YOUR FIRST AND LAST MEAL.   promethazine (PHENERGAN) 12.5 MG tablet 1-2 po q4-6 h prn nausea or vomiting   tirzepatide (MOUNJARO) 15 MG/0.5ML Pen Inject 15 mg into the skin once a week.   traZODone (DESYREL) 100 MG tablet Take 2 tablets (200 mg total) by mouth at bedtime.   triamcinolone cream (KENALOG) 0.1 % Apply 1 application topically 2 (two) times daily.   Vitamin D, Ergocalciferol, (DRISDOL) 1.25 MG (50000 UNIT) CAPS capsule Take 1 capsule (50,000 Units total) by mouth every 7 (seven) days.   [DISCONTINUED] insulin glargine (LANTUS SOLOSTAR) 100 UNIT/ML Solostar Pen Inject 20 Units into the skin daily.   fluticasone (FLONASE) 50 MCG/ACT nasal spray Place 1 spray into both nostrils daily for 14 days.   insulin glargine (LANTUS SOLOSTAR) 100 UNIT/ML Solostar Pen Inject 25 Units into the skin at bedtime.   [DISCONTINUED] insulin aspart (NOVOLOG FLEXPEN) 100 UNIT/ML FlexPen Inject 6-12 Units into the skin 3 (three) times daily with meals.   No  facility-administered encounter medications on file as of 11/21/2022.    ALLERGIES: Allergies  Allergen Reactions   Synthroid [Levothyroxine Sodium] Anaphylaxis   Hydrochlorothiazide     Other reaction(s): cramps   Iodine Rash    I get a skin rash sometimes if iodine applied to my skin.      VACCINATION STATUS:  There is no immunization history on file for this patient.  Diabetes She presents for her follow-up diabetic visit. She has type 2 diabetes mellitus. Onset time: diagnosed at approx age of 81. Her disease course has been stable. There are no hypoglycemic associated symptoms. There are no diabetic associated symptoms. There are no hypoglycemic complications. There are no diabetic complications. Risk factors for coronary artery disease include diabetes mellitus, obesity, hypertension and post-menopausal. Current diabetic treatment includes intensive insulin program and oral agent (monotherapy) (and Mounjaro). She is compliant with treatment all of the time. Her weight is decreasing steadily. She is following a generally healthy diet. Meal planning includes avoidance of concentrated sweets. She has not had a previous visit with a dietitian. She participates in exercise daily. Her home blood glucose trend is fluctuating minimally. Her overall blood glucose range is 140-180 mg/dl. (She presents today with her CGM showing mostly at goal glycemic profile.  Her POCT A1c today is 6.6%, increasing slightly from last visit of 6.2%.  She has lost some weight since discontinuation of Novolog.  She is worried that glucose is too high without it, though.  Analysis of her CGM shows TIR 85%, TAR 15%, TBR <2% with a GMI of 6.9%.) An ACE inhibitor/angiotensin II receptor blocker is being taken. She sees a podiatrist.Eye exam is current.     Review of systems  Constitutional: + steadily decreasing body weight,  current Body mass  index is 33.67 kg/m. , no fatigue, no subjective hyperthermia, no  subjective hypothermia Eyes: no blurry vision, no xerophthalmia ENT: no sore throat, no nodules palpated in throat, no dysphagia/odynophagia, no hoarseness Cardiovascular: no chest pain, no shortness of breath, no palpitations, no leg swelling Respiratory: no cough, no shortness of breath Gastrointestinal: no nausea/vomiting/diarrhea Musculoskeletal: scattered joint pain- recently had steroid injection in neck Skin: no rashes, no hyperemia Neurological: no tremors, no numbness, no tingling, no dizziness Psychiatric: no depression, no anxiety  Objective:     BP 122/68 (BP Location: Right Arm, Patient Position: Sitting, Cuff Size: Large)   Pulse (!) 50   Ht 5' 6.5" (1.689 m)   Wt 211 lb 12.8 oz (96.1 kg)   BMI 33.67 kg/m   Wt Readings from Last 3 Encounters:  11/21/22 211 lb 12.8 oz (96.1 kg)  08/21/22 222 lb 9.6 oz (101 kg)  06/14/22 226 lb 12.8 oz (102.9 kg)     BP Readings from Last 3 Encounters:  11/21/22 122/68  08/21/22 (!) 150/80  06/14/22 (!) 141/72      Physical Exam- Limited  Constitutional:  Body mass index is 33.67 kg/m. , not in acute distress, normal state of mind Eyes:  EOMI, no exophthalmos Musculoskeletal: no gross deformities, strength intact in all four extremities, no gross restriction of joint movements Skin:  no rashes, no hyperemia Neurological: no tremor with outstretched hands  Diabetic Foot Exam - Simple   No data filed     CMP ( most recent) CMP     Component Value Date/Time   NA 142 10/29/2022 1158   NA 141 05/29/2017 0921   K 4.5 10/29/2022 1158   K 4.8 05/29/2017 0921   CL 104 10/29/2022 1158   CO2 23 10/29/2022 1158   CO2 28 05/29/2017 0921   GLUCOSE 103 (H) 10/29/2022 1158   GLUCOSE 111 (H) 06/12/2022 0908   GLUCOSE 184 (H) 05/29/2017 0921   BUN 21 10/29/2022 1158   BUN 13.5 05/29/2017 0921   CREATININE 0.82 10/29/2022 1158   CREATININE 0.76 06/12/2022 0908   CREATININE 0.8 05/29/2017 0921   CALCIUM 9.6 10/29/2022 1158    CALCIUM 9.5 05/29/2017 0921   PROT 6.2 10/29/2022 1158   PROT 7.1 05/29/2017 0921   ALBUMIN 4.3 10/29/2022 1158   ALBUMIN 80 mg/L 05/17/2022 0922   ALBUMIN 3.6 05/29/2017 0921   AST 17 10/29/2022 1158   AST 17 06/12/2022 0908   AST 17 05/29/2017 0921   ALT 17 10/29/2022 1158   ALT 20 06/12/2022 0908   ALT 20 05/29/2017 0921   ALKPHOS 90 10/29/2022 1158   ALKPHOS 104 05/29/2017 0921   BILITOT 0.4 10/29/2022 1158   BILITOT 0.5 06/12/2022 0908   BILITOT 0.40 05/29/2017 0921   GFRNONAA >60 06/12/2022 0908   GFRAA >60 12/08/2019 0849     Diabetic Labs (most recent): Lab Results  Component Value Date   HGBA1C 6.6 (A) 11/21/2022   HGBA1C 6.2 (A) 08/21/2022   HGBA1C 6.2 (A) 05/17/2022     Lipid Panel ( most recent) Lipid Panel     Component Value Date/Time   CHOL 146 10/29/2022 1158   TRIG 113 10/29/2022 1158   HDL 62 10/29/2022 1158   CHOLHDL 2.4 10/29/2022 1158   LDLCALC 64 10/29/2022 1158   LABVLDL 20 10/29/2022 1158      Lab Results  Component Value Date   TSH 0.871 10/29/2022   TSH 2.93 07/11/2016   FREET4 1.22 10/29/2022  Assessment & Plan:   1) Type 2 diabetes mellitus without complication, with long-term current use of insulin (HCC)  She presents today with her CGM showing mostly at goal glycemic profile.  Her POCT A1c today is 6.6%, increasing slightly from last visit of 6.2%.  She has lost some weight since discontinuation of Novolog.  She is worried that glucose is too high without it, though.  Analysis of her CGM shows TIR 85%, TAR 15%, TBR <2% with a GMI of 6.9%.  - [name] has currently uncontrolled symptomatic type 2 DM since 67 years of age.   -Recent labs reviewed.  - I had a long discussion with her about the progressive nature of diabetes and the pathology behind its complications. -her diabetes is not currently complicated but she remains at a high risk for more acute and chronic complications which include CAD, CVA, CKD,  retinopathy, and neuropathy. These are all discussed in detail with her.  The following Lifestyle Medicine recommendations according to American College of Lifestyle Medicine [facility]) were discussed and offered to patient and she agrees to start the journey:  A. Whole Foods, Plant-based plate comprising of fruits and vegetables, plant-based proteins, whole-grain carbohydrates was discussed in detail with the patient.   A list for source of those nutrients were also provided to the patient.  Patient will use only water or unsweetened tea for hydration. B.  The need to stay away from risky substances including alcohol, smoking; obtaining 7 to 9 hours of restorative sleep, at least 150 minutes of moderate intensity exercise weekly, the importance of healthy social connections,  and stress reduction techniques were discussed. C.  A full color page of  Calorie density of various food groups per pound showing examples of each food groups was provided to the patient.  - Nutritional counseling repeated at each appointment due to patients tendency to fall back in to old habits.  - The patient admits there is a room for improvement in their diet and drink choices. -  Suggestion is made for the patient to avoid simple carbohydrates from their diet including Cakes, Sweet Desserts / Pastries, Ice Cream, Soda (diet and regular), Sweet Tea, Candies, Chips, Cookies, Sweet Pastries, Store Bought Juices, Alcohol in Excess of 1-2 drinks a day, Artificial Sweeteners, Coffee Creamer, and "Sugar-free" Products. This will help patient to have stable blood glucose profile and potentially avoid unintended weight gain.   - I encouraged the patient to switch to unprocessed or minimally processed complex starch and increased protein intake (animal or plant source), fruits, and vegetables.   - Patient is advised to stick to a routine mealtimes to eat 3 meals a day and avoid unnecessary snacks (to snack only to correct  hypoglycemia).  - I have approached her with the following individualized plan to manage her diabetes and patient agrees:   -She is advised to increase her Lantus to 25 units SQ nightly.  She can continue her Mounjaro 15 mg SQ weekly and Metformin 500 mg ER daily with breakfast for now.   -she is encouraged to start monitoring glucose 4 times daily (using her CGM), before meals and before bed, and to call the clinic if she has readings less than 70 or above 300 for 3 tests in a row.  - she is warned not to take insulin without proper monitoring per orders. - Adjustment parameters are given to her for hypo and hyperglycemia in writing.  - her Parlodel and Marcelline Deist was discontinued previously, due to  side effect profile and to deescalate her treatment plan.   - Specific targets for  A1c; LDL, HDL, and Triglycerides were discussed with the patient.  2) Blood Pressure /Hypertension:  her blood pressure is controlled to target.   she is advised to continue her current medications including Lisinopril 10 mg p.o. daily with breakfast, Chlorthalidone 25 mg po daily.  3) Lipids/Hyperlipidemia:    Her recent lipid panel from 10/29/22 shows controlled LDL of 64.  she is advised to continue Lipitor 20 mg daily at bedtime.  Side effects and precautions discussed with her.   4)  Weight/Diet:  her Body mass index is 33.67 kg/m.  -  clearly complicating her diabetes care.   she is a candidate for weight loss. I discussed with her the fact that loss of 5 - 10% of her  current body weight will have the most impact on her diabetes management.  Exercise, and detailed carbohydrates information provided  -  detailed on discharge instructions.  5)Vitamin D deficiency Her recent vitamin D level on 10/29/22 was 19.5.  I discussed and initiated Ergocalciferol 50000 units po weekly x 12 weeks, once finished she can switch over to OTC Vitamin D3 5000 units daily as maintenance dose.    6) Chronic Care/Health  Maintenance: -she is on ACEI/ARB and Statin medications and is encouraged to initiate and continue to follow up with Ophthalmology, Dentist, Podiatrist at least yearly or according to recommendations, and advised to stay away from smoking. I have recommended yearly flu vaccine and pneumonia vaccine at least every 5 years; moderate intensity exercise for up to 150 minutes weekly; and sleep for at least 7 hours a day.  - she is advised to maintain close follow up with Darrow Bussing, MD for primary care needs, as well as her other providers for optimal and coordinated care.     I spent  43  minutes in the care of the patient today including review of labs from CMP, Lipids, Thyroid Function, Hematology (current and previous including abstractions from other facilities); face-to-face time discussing  her blood glucose readings/logs, discussing hypoglycemia and hyperglycemia episodes and symptoms, medications doses, her options of short and long term treatment based on the latest standards of care / guidelines;  discussion about incorporating lifestyle medicine;  and documenting the encounter. Risk reduction counseling performed per USPSTF guidelines to reduce obesity and cardiovascular risk factors.     Please refer to Patient Instructions for Blood Glucose Monitoring and Insulin/Medications Dosing Guide"  in media tab for additional information. Please  also refer to " Patient Self Inventory" in the Media  tab for reviewed elements of pertinent patient history.  [name] participated in the discussions, expressed understanding, and voiced agreement with the above plans.  All questions were answered to her satisfaction. she is encouraged to contact clinic should she have any questions or concerns prior to her return visit.     Follow up plan: - Return in about 3 months (around 02/20/2023) for Diabetes F/U with A1c in office, No previsit labs, Bring meter and logs.   Ronny Bacon,  [facility] [address] [place], [place] [address] Phone: [phone] Fax: [phone]  11/21/2022, 9:28 AM

## 2022-11-22 ENCOUNTER — Other Ambulatory Visit: Payer: Self-pay | Admitting: Gastroenterology

## 2022-11-22 DIAGNOSIS — R110 Nausea: Secondary | ICD-10-CM

## 2022-11-22 DIAGNOSIS — R1013 Epigastric pain: Secondary | ICD-10-CM

## 2022-11-23 ENCOUNTER — Telehealth: Payer: Self-pay | Admitting: Internal Medicine

## 2022-11-23 NOTE — Telephone Encounter (Signed)
Please schedule pt a visit for further refills

## 2022-11-23 NOTE — Telephone Encounter (Signed)
Left a message asking pt to return call to the office to get scheduled for an OV in order for more refills.

## 2022-12-02 NOTE — Progress Notes (Signed)
GI Office Note    Referring Provider: Darrow Bussing, MD Primary Care Physician:  Darrow Bussing, MD  Primary Gastroenterologist: Hennie Duos. Marletta Lor, DO   Chief Complaint   Chief Complaint  Patient presents with   Medication Refill    Medication refill. Not having problems..    History of Present Illness   [name] is a 67 y.o. female presenting today for follow up. Last seen 12/2019 for n/v. H/o IDA, dysphagia, GERD.  H/o lung cancer s/p resection in 2018. Did not require XRT.  Presents for follow up for refills today. Taking pantoprazole 40mg  daily. About 3-4 hours after taking PPI, she will have regurgitation. Denies heartburn. She has some solid food dysphagia. No abdominal pain. BM regular. No melena, brbpr.   EGD 04/2019: low-grade narrowing Schatzki ring at GEJ. S/p dilation. Mild gastritis/duodenitis, fundic gland polyps. Duodenal biopsies negative. Gastric bx with mild nonspecific reactive gastropathy, no H.pylori.   Colonoscopy 04/2019: ileum normal. Two 3-41mm polyps removed, diverticulosis, ext/int hemorrhoids. Tubular adenoma. Random colon bx negative. Next colonoscopy five years.   Small bowel capsule study 05/2019: mild duodenititis. No abnormalities seen in small bowel .    Wt Readings from Last 40 Encounters:  12/04/22 213 lb (96.6 kg)  11/21/22 211 lb 12.8 oz (96.1 kg)  08/21/22 222 lb 9.6 oz (101 kg)  06/14/22 226 lb 12.8 oz (102.9 kg)  05/17/22 227 lb 6.4 oz (103.1 kg)  04/27/22 227 lb (103 kg)  04/17/22 230 lb 12.8 oz (104.7 kg)  11/29/21 235 lb 3.2 oz (106.7 kg)  02/06/21 238 lb (108 kg)  12/09/19 242 lb 4.8 oz (109.9 kg)  12/01/19 243 lb (110.2 kg)  09/30/19 247 lb (112 kg)  07/15/19 253 lb (114.8 kg)  12/09/18 262 lb (118.8 kg)       Medications   Current Outpatient Medications  Medication Sig Dispense Refill   ACCU-CHEK AVIVA PLUS test strip USE TO MONITOR GLUCOSE LEVELS TWICE A DAY E11.9 100 strip 12   ACCU-CHEK SOFTCLIX  LANCETS lancets Use to monitor glucose levels BID; E11.9 (Patient taking differently: 1 each by Other route 2 (two) times daily. E11.9) 100 each 12   acetaminophen (TYLENOL) 500 MG tablet Take 1 tablet (500 mg total) by mouth every 6 (six) hours as needed. (Patient taking differently: Take 500 mg by mouth every 6 (six) hours as needed (for pain).) 30 tablet 0   atorvastatin (LIPITOR) 20 MG tablet Take 20 mg by mouth daily.  11   Blood Glucose Calibration (GLUCOSE CONTROL) SOLN 1 Bottle by In Vitro route as needed. Use to calibrate Accu-Chek Aviva Plus device prn; please provide control solution compatible with Accu-Chek Aviva Plus device. (Patient taking differently: 1 each by Other route as needed (Use to calibrate glucometer). E11.9) 1 each 1   Blood Glucose Monitoring Suppl (ACCU-CHEK AVIVA PLUS) w/Device KIT 1 each by Does not apply route 2 (two) times daily. Use to monitor glucose levels BID; E11.9 (Patient taking differently: 1 each by Does not apply route 2 (two) times daily. E11.9) 1 kit 0   cetirizine (ZYRTEC ALLERGY) 10 MG tablet Take 1 tablet (10 mg total) by mouth daily. 30 tablet 0   diclofenac Sodium (VOLTAREN ARTHRITIS PAIN) 1 % GEL Apply 4 g topically 4 (four) times daily. 150 g 0   escitalopram (LEXAPRO) 10 MG tablet Take 1 tablet (10 mg total) by mouth every morning. 90 tablet 3   fluticasone (FLONASE) 50 MCG/ACT nasal spray Place 1 spray  into both nostrils daily for 14 days. 16 g 0   ibuprofen (ADVIL) 800 MG tablet Take 800 mg by mouth every 8 (eight) hours as needed.     insulin glargine (LANTUS SOLOSTAR) 100 UNIT/ML Solostar Pen Inject 25 Units into the skin at bedtime. 25 mL 3   Insulin Pen Needle (PEN NEEDLES) 31G X 8 MM MISC Use As Directed 100 each 5   lisinopril (ZESTRIL) 10 MG tablet Take 10 mg by mouth daily.     metFORMIN (GLUCOPHAGE-XR) 500 MG 24 hr tablet Take 1 tablet (500 mg total) by mouth daily with breakfast. 90 tablet 3   pantoprazole (PROTONIX) 40 MG tablet TAKE 1  TABLET BY MOUTH TWICE A DAY 30 MINS BEFORE YOUR FIRST AND LAST MEAL. 60 tablet 0   promethazine (PHENERGAN) 12.5 MG tablet 1-2 po q4-6 h prn nausea or vomiting 30 tablet 1   tirzepatide (MOUNJARO) 15 MG/0.5ML Pen Inject 15 mg into the skin once a week. 6 mL 3   traZODone (DESYREL) 100 MG tablet Take 2 tablets (200 mg total) by mouth at bedtime. 180 tablet 3   triamcinolone cream (KENALOG) 0.1 % Apply 1 application topically 2 (two) times daily. 30 g 0   Vitamin D, Ergocalciferol, (DRISDOL) 1.25 MG (50000 UNIT) CAPS capsule Take 1 capsule (50,000 Units total) by mouth every 7 (seven) days. 12 capsule 0   benzonatate (TESSALON) 100 MG capsule Take 1 capsule (100 mg total) by mouth every 8 (eight) hours. (Patient not taking: Reported on 12/04/2022) 30 capsule 0   chlorthalidone (HYGROTON) 25 MG tablet Take 12.5 mg by mouth daily.      No current facility-administered medications for this visit.    Allergies   Allergies as of 12/04/2022 - Review Complete 12/04/2022  Allergen Reaction Noted   Synthroid [levothyroxine sodium] Anaphylaxis 11/29/2011   Hydrochlorothiazide  09/13/2020   Iodine Rash 11/12/2011     Past Medical History   Past Medical History:  Diagnosis Date   Adenocarcinoma of right lung, stage 1 (HCC) 11/29/2016   Cancer (HCC)    Chronic pain    Depression    takes Lexapro daily   Diabetes mellitus    takes Metformin daily and has an insulin pump.Fasting blood sugar runs250   GERD (gastroesophageal reflux disease)    takes Pantoprazole daily   History of colon polyps    benign   Hyperlipidemia    takes Atorvastatin daily   Hypertension    takes Amlodipine and Lisinopril daily   Insomnia    takes Trazodone nightly   Nocturia    Thyroid disease    Urinary frequency    Urinary urgency     Past Surgical History   Past Surgical History:  Procedure Laterality Date   ABDOMINAL HYSTERECTOMY     ABDOMINAL SURGERY     BIOPSY  05/28/2019   Procedure: BIOPSY;  Surgeon:  West Bali, MD;  Location: [facility];  Service: Endoscopy;;  random colon/gastric/duodenum   COLONOSCOPY  2005 MAC   TICs, IH   COLONOSCOPY N/A 03/08/2014   Procedure: COLONOSCOPY;  Surgeon: West Bali, MD;  Location: [facility];  Service: Endoscopy;  Laterality: N/A;  1015   COLONOSCOPY WITH PROPOFOL N/A 05/28/2019   Procedure: COLONOSCOPY WITH PROPOFOL;  Surgeon: West Bali, MD;  Location: [facility];  Service: Endoscopy;  Laterality: N/A;  9:15am   CYSTOSTOMY  11/22/2011   Procedure: CYSTOSTOMY SUPRAPUBIC;  Surgeon: Ky Barban, MD;  Location: [facility];  Service: Urology;  Laterality: N/A;   ESOPHAGOGASTRODUODENOSCOPY N/A 03/08/2014   Procedure: ESOPHAGOGASTRODUODENOSCOPY (EGD);  Surgeon: West Bali, MD;  [facility]: [facility];  Service: Endoscopy;  Laterality: N/A;   ESOPHAGOGASTRODUODENOSCOPY     ESOPHAGOGASTRODUODENOSCOPY (EGD) WITH PROPOFOL N/A 05/28/2019   Procedure: ESOPHAGOGASTRODUODENOSCOPY (EGD) WITH PROPOFOL;  Surgeon: West Bali, MD;  [facility]: [facility];  Service: Endoscopy;  Laterality: N/A;   GIVENS CAPSULE STUDY N/A 06/15/2019   Procedure: GIVENS CAPSULE STUDY;  Surgeon: West Bali, MD;  [facility]: [facility];  Service: Endoscopy;  Laterality: N/A;  7:30am   HALLUX VALGUS CORRECTION     bilateral foot surgery for bone repairs-multiple   LOBECTOMY Right 10/15/2016   Procedure: RIGHT UPPER LOBECTOMY;  Surgeon: Loreli Slot, MD;  Location[facility];  Service: Thoracic;  Laterality: Right;   LYMPH NODE DISSECTION Right 10/15/2016   Procedure: LYMPH NODE DISSECTION;  Surgeon: Loreli Slot, MD;  Location: [facility] OR;  Service: Thoracic;  Laterality: Right;   POLYPECTOMY  05/28/2019   Procedure: POLYPECTOMY;  Surgeon: West Bali, MD;  [facility]: [facility];  Service: Endoscopy;;   SAVORY DILATION N/A 05/28/2019   Procedure: SAVORY DILATION;  Surgeon: West Bali, MD;  Location: [facility];  Service: Endoscopy;   Laterality: N/A;  15/16/17   VAGINA RECONSTRUCTION SURGERY     tvt 2006- [place]   VIDEO ASSISTED THORACOSCOPY (VATS)/WEDGE RESECTION Right 10/15/2016   Procedure: RIGHT VIDEO ASSISTED THORACOSCOPY (VATS)/WEDGE RESECTION;  Surgeon: Loreli Slot, MD;  Location: [facility];  Service: Thoracic;  Laterality: Right;    Past Family History   Family History  Problem Relation Age of Onset   Asthma Other    Diabetes Other    Arthritis Other    Cancer Mother    Cancer Brother    Anesthesia problems Neg Hx    Hypotension Neg Hx    Malignant hyperthermia Neg Hx    Pseudochol deficiency Neg Hx    Colon polyps Neg Hx    Colon cancer Neg Hx    Celiac disease Neg Hx    Pancreatic cancer Neg Hx    Stomach cancer Neg Hx    Ulcerative colitis Neg Hx    Crohn's disease Neg Hx     Past Social History   Social History   Socioeconomic History   Marital status: Single    Spouse name: Not on file   Number of children: Not on file   Years of education: Not on file   Highest education level: Not on file  Occupational History   Not on file  Tobacco Use   Smoking status: Former    Packs/day: 1.00    Years: 32.00    Additional pack years: 0.00    Total pack years: 32.00    Types: Cigarettes   Smokeless tobacco: Never   Tobacco comments:    quit smoking 20 yrs ago  Vaping Use   Vaping Use: Never used  Substance and Sexual Activity   Alcohol use: No    Comment: rarely   Drug use: No   Sexual activity: Not on file  Other Topics Concern   Not on file  Social History Narrative   Not on file   Social Determinants of Health   Financial Resource Strain: Not on file  Food Insecurity: Not on file  Transportation Needs: Not on file  Physical Activity: Not on file  Stress: Not on file  Social Connections: Not on file  Intimate Partner Violence: Not on file    Review of Systems   General: Negative for anorexia, unintentional weight loss, fever, chills, fatigue,  weakness. ENT: Negative for hoarseness,   nasal congestion. See hpi CV: Negative for chest pain, angina, palpitations, dyspnea on exertion, peripheral edema.  Respiratory: Negative for dyspnea at rest, dyspnea on exertion, cough, sputum, wheezing.  GI: See history of present illness. GU:  Negative for dysuria, hematuria, urinary incontinence, urinary frequency, nocturnal urination.  Endo: Negative for unusual weight change.     Physical Exam   BP 128/81 (BP Location: Left Arm, Patient Position: Sitting, Cuff Size: Large)   Pulse (!) 101   Temp 98.1 F (36.7 C) (Temporal)   Ht 5\' 7"  (1.702 m)   Wt 213 lb (96.6 kg)   SpO2 96%   BMI 33.36 kg/m    General: Well-nourished, well-developed in no acute distress.  Eyes: No icterus. Mouth: Oropharyngeal mucosa moist and pink   Lungs: Clear to auscultation bilaterally.  Heart: Regular rate and rhythm, no murmurs rubs or gallops.  Abdomen: Bowel sounds are normal, nontender, nondistended, no hepatosplenomegaly or masses,  no abdominal bruits or hernia , no rebound or guarding.  Rectal: not performed Extremities: No lower extremity edema. No clubbing or deformities. Neuro: Alert and oriented x 4   Skin: Warm and dry, no jaundice.   Psych: Alert and cooperative, normal mood and affect.  Labs   Lab Results  Component Value Date   HGBA1C 6.6 (A) 11/21/2022   Lab Results  Component Value Date   TSH 0.871 10/29/2022   Lab Results  Component Value Date   WBC 9.5 06/12/2022   HGB 15.3 (H) 06/12/2022   HCT 44.0 06/12/2022   MCV 82.2 06/12/2022   PLT 234 06/12/2022   Lab Results  Component Value Date   HGBA1C 6.6 (A) 11/21/2022   Lab Results  Component Value Date   ALT 17 10/29/2022   AST 17 10/29/2022   ALKPHOS 90 10/29/2022   BILITOT 0.4 10/29/2022   Lab Results  Component Value Date   CREATININE 0.82 10/29/2022   BUN 21 10/29/2022   NA 142 10/29/2022   K 4.5 10/29/2022   CL 104 10/29/2022   CO2 23 10/29/2022     Imaging Studies   No results found.  Assessment   GERD/dysphagia/abnormal esophagus on CT: CT chest 05/2022 with mild circumferential distal esophageal thickening, chronic finding on prior imaging. Complains of regurgitation several hours after taking PPI, solid food dysphagia. Recommend EGD to rule out complicated GERD, strictures, less likely malignancy.   PLAN   EGD/ED with Dr. Marletta Lor. ASA 3.  I have discussed the risks, alternatives, benefits with regards to but not limited to the risk of reaction to medication, bleeding, infection, perforation and the patient is agreeable to proceed. Written consent to be obtained. Continue pantoprazole 40mg  daily before breakfast.    Leanna Battles. Melvyn Neth, MHS, PA-C [facility]

## 2022-12-04 ENCOUNTER — Encounter: Payer: Self-pay | Admitting: Gastroenterology

## 2022-12-04 ENCOUNTER — Ambulatory Visit (INDEPENDENT_AMBULATORY_CARE_PROVIDER_SITE_OTHER): Payer: Medicare Other | Admitting: Gastroenterology

## 2022-12-04 VITALS — BP 128/81 | HR 101 | Temp 98.1°F | Ht 67.0 in | Wt 213.0 lb

## 2022-12-04 DIAGNOSIS — R1319 Other dysphagia: Secondary | ICD-10-CM

## 2022-12-04 DIAGNOSIS — K219 Gastro-esophageal reflux disease without esophagitis: Secondary | ICD-10-CM | POA: Insufficient documentation

## 2022-12-04 DIAGNOSIS — R933 Abnormal findings on diagnostic imaging of other parts of digestive tract: Secondary | ICD-10-CM | POA: Diagnosis not present

## 2022-12-04 NOTE — Patient Instructions (Signed)
Continue pantoprazole 40 mg daily before breakfast. Upper endoscopy to be scheduled.  You will need to hold Mounjaro 7 days prior to upper endoscopy.

## 2022-12-05 ENCOUNTER — Telehealth: Payer: Self-pay | Admitting: *Deleted

## 2022-12-05 NOTE — Telephone Encounter (Signed)
LMOVM to return call  EGD/ED w/Dr.Carver, asa 3

## 2022-12-06 ENCOUNTER — Encounter: Payer: Self-pay | Admitting: *Deleted

## 2022-12-10 ENCOUNTER — Encounter: Payer: Self-pay | Admitting: *Deleted

## 2022-12-15 ENCOUNTER — Other Ambulatory Visit: Payer: Self-pay | Admitting: Internal Medicine

## 2022-12-15 DIAGNOSIS — R110 Nausea: Secondary | ICD-10-CM

## 2022-12-15 DIAGNOSIS — K219 Gastro-esophageal reflux disease without esophagitis: Secondary | ICD-10-CM

## 2022-12-15 DIAGNOSIS — R1013 Epigastric pain: Secondary | ICD-10-CM

## 2022-12-16 ENCOUNTER — Encounter: Payer: Self-pay | Admitting: Gastroenterology

## 2023-01-07 NOTE — Patient Instructions (Signed)
[name]  01/07/2023     @PREFPERIOPPHARMACY @   Your procedure is scheduled on  01/11/2023.   Report to [facility] at  0745 A.M.   Call this number if you have problems the morning of surgery:  [phone]  If you experience any cold or flu symptoms such as cough, fever, chills, shortness of breath, etc. between now and your scheduled surgery, please notify us at the above number.   Remember:  Follow the diet instructions given to you by the office.      Your last dose of mounjaro should have been on 6/6/ or before.     Take 12.5 units of your lantus the night before your procedure.     DO NOT take any medications for diabetes the morning of your procedure.     Take these medicines the morning of surgery with A SIP OF WATER                        escitalopram, pantoprazole.     Do not wear jewelry, make-up or nail polish, including gel polish,  artificial nails, or any other type of covering on natural nails (fingers and  toes).  Do not wear lotions, powders, or perfumes, or deodorant.  Do not shave 48 hours prior to surgery.  Men may shave face and neck.  Do not bring valuables to the hospital.  [facility] is not responsible for any belongings or valuables.  Contacts, dentures or bridgework may not be worn into surgery.  Leave your suitcase in the car.  After surgery it may be brought to your room.  For patients admitted to the hospital, discharge time will be determined by your treatment team.  Patients discharged the day of surgery will not be allowed to drive home and must have someone with them for 24 hours.    Special instructions:   DO NOT smoke tobacco or vape for 24 hours before your procedure.   Please read over the following fact sheets that you were given. Anesthesia Post-op Instructions and Care and Recovery After Surgery        Upper Endoscopy, Adult, Care After After the procedure, it is common to have a sore throat. It is  also common to have: Mild stomach pain or discomfort. Bloating. Nausea. Follow these instructions at home: The instructions below may help you care for yourself at home. Your health care provider may give you more instructions. If you have questions, ask your health care provider. If you were given a sedative during the procedure, it can affect you for several hours. Do not drive or operate machinery until your health care provider says that it is safe. If you will be going home right after the procedure, plan to have a responsible adult: Take you home from the hospital or clinic. You will not be allowed to drive. Care for you for the time you are told. Follow instructions from your health care provider about what you may eat and drink. Return to your normal activities as told by your health care provider. Ask your health care provider what activities are safe for you. Take over-the-counter and prescription medicines only as told by your health care provider. Contact a health care provider if you: Have a sore throat that lasts longer than one day. Have trouble swallowing. Have a fever. Get help right away if you: Vomit blood or your vomit looks like coffee grounds. Have  bloody, black, or tarry stools. Have a very bad sore throat or you cannot swallow. Have difficulty breathing or very bad pain in your chest or abdomen. These symptoms may be an emergency. Get help right away. Call 911. Do not wait to see if the symptoms will go away. Do not drive yourself to the hospital. Summary After the procedure, it is common to have a sore throat, mild stomach discomfort, bloating, and nausea. If you were given a sedative during the procedure, it can affect you for several hours. Do not drive until your health care provider says that it is safe. Follow instructions from your health care provider about what you may eat and drink. Return to your normal activities as told by your health care  provider. This information is not intended to replace advice given to you by your health care provider. Make sure you discuss any questions you have with your health care provider. Document Revised: 10/25/2021 Document Reviewed: 10/25/2021 Elsevier Patient Education  2024 [employer] Inc. Esophageal Dilatation Esophageal dilatation, also called esophageal dilation, is a procedure to widen or open a blocked or narrowed part of the esophagus. The esophagus is the part of the body that moves food and liquid from the mouth to the stomach. You may need this procedure if: You have a buildup of scar tissue in your esophagus that makes it difficult, painful, or impossible to swallow. This can be caused by gastroesophageal reflux disease (GERD). You have cancer of the esophagus. There is a problem with how food moves through your esophagus. In some cases, you may need this procedure repeated at a later time to dilate the esophagus gradually. Tell a health care provider about: Any allergies you have. All medicines you are taking, including vitamins, herbs, eye drops, creams, and over-the-counter medicines. Any problems you or family members have had with anesthetic medicines. Any blood disorders you have. Any surgeries you have had. Any medical conditions you have. Any antibiotic medicines you are required to take before dental procedures. Whether you are pregnant or may be pregnant. What are the risks? Generally, this is a safe procedure. However, problems may occur, including: Bleeding due to a tear in the lining of the esophagus. A hole, or perforation, in the esophagus. What happens before the procedure? Ask your health care provider about: Changing or stopping your regular medicines. This is especially important if you are taking diabetes medicines or blood thinners. Taking medicines such as aspirin and ibuprofen. These medicines can thin your blood. Do not take these medicines unless your health  care provider tells you to take them. Taking over-the-counter medicines, vitamins, herbs, and supplements. Follow instructions from your health care provider about eating or drinking restrictions. Plan to have a responsible adult take you home from the hospital or clinic. Plan to have a responsible adult care for you for the time you are told after you leave the hospital or clinic. This is important. What happens during the procedure? You may be given a medicine to help you relax (sedative). A numbing medicine may be sprayed into the back of your throat, or you may gargle the medicine. Your health care provider may perform the dilatation using various surgical instruments, such as: Simple dilators. This instrument is carefully placed in the esophagus to stretch it. Guided wire bougies. This involves using an endoscope to insert a wire into the esophagus. A dilator is passed over this wire to enlarge the esophagus. Then the wire is removed. Balloon dilators. An endoscope with  a small balloon is inserted into the esophagus. The balloon is inflated to stretch the esophagus and open it up. The procedure may vary among health care providers and hospitals. What can I expect after the procedure? Your blood pressure, heart rate, breathing rate, and blood oxygen level will be monitored until you leave the hospital or clinic. Your throat may feel slightly sore and numb. This will get better over time. You will not be allowed to eat or drink until your throat is no longer numb. When you are able to drink, urinate, and sit on the edge of the bed without nausea or dizziness, you may be able to return home. Follow these instructions at home: Take over-the-counter and prescription medicines only as told by your health care provider. If you were given a sedative during the procedure, it can affect you for several hours. Do not drive or operate machinery until your health care provider says that it is safe. Plan  to have a responsible adult care for you for the time you are told. This is important. Follow instructions from your health care provider about any eating or drinking restrictions. Do not use any products that contain nicotine or tobacco, such as cigarettes, e-cigarettes, and chewing tobacco. If you need help quitting, ask your health care provider. Keep all follow-up visits. This is important. Contact a health care provider if: You have a fever. You have pain that is not relieved by medicine. Get help right away if: You have chest pain. You have trouble breathing. You have trouble swallowing. You vomit blood. You have black, tarry, or bloody stools. These symptoms may represent a serious problem that is an emergency. Do not wait to see if the symptoms will go away. Get medical help right away. Call your local emergency services (911 in the [place] Do not drive yourself to the hospital. Summary Esophageal dilatation, also called esophageal dilation, is a procedure to widen or open a blocked or narrowed part of the esophagus. Plan to have a responsible adult take you home from the hospital or clinic. For this procedure, a numbing medicine may be sprayed into the back of your throat, or you may gargle the medicine. Do not drive or operate machinery until your health care provider says that it is safe. This information is not intended to replace advice given to you by your health care provider. Make sure you discuss any questions you have with your health care provider. Document Revised: 12/02/2019 Document Reviewed: 12/02/2019 Elsevier Patient Education  2024 Elsevier Inc. Monitored Anesthesia Care, Care After The following information offers guidance on how to care for yourself after your procedure. Your health care provider may also give you more specific instructions. If you have problems or questions, contact your health care provider. What can I expect after the procedure? After the  procedure, it is common to have: Tiredness. Little or no memory about what happened during or after the procedure. Impaired judgment when it comes to making decisions. Nausea or vomiting. Some trouble with balance. Follow these instructions at home: For the time period you were told by your health care provider:  Rest. Do not participate in activities where you could fall or become injured. Do not drive or use machinery. Do not drink alcohol. Do not take sleeping pills or medicines that cause drowsiness. Do not make important decisions or sign legal documents. Do not take care of children on your own. Medicines Take over-the-counter and prescription medicines only as told by your health  care provider. If you were prescribed antibiotics, take them as told by your health care provider. Do not stop using the antibiotic even if you start to feel better. Eating and drinking Follow instructions from your health care provider about what you may eat and drink. Drink enough fluid to keep your urine pale yellow. If you vomit: Drink clear fluids slowly and in small amounts as you are able. Clear fluids include water, ice chips, low-calorie sports drinks, and fruit juice that has water added to it (diluted fruit juice). Eat light and bland foods in small amounts as you are able. These foods include bananas, applesauce, rice, lean meats, toast, and crackers. General instructions  Have a responsible adult stay with you for the time you are told. It is important to have someone help care for you until you are awake and alert. If you have sleep apnea, surgery and some medicines can increase your risk for breathing problems. Follow instructions from your health care provider about wearing your sleep device: When you are sleeping. This includes during daytime naps. While taking prescription pain medicines, sleeping medicines, or medicines that make you drowsy. Do not use any products that contain  nicotine or tobacco. These products include cigarettes, chewing tobacco, and vaping devices, such as e-cigarettes. If you need help quitting, ask your health care provider. Contact a health care provider if: You feel nauseous or vomit every time you eat or drink. You feel light-headed. You are still sleepy or having trouble with balance after 24 hours. You get a rash. You have a fever. You have redness or swelling around the IV site. Get help right away if: You have trouble breathing. You have new confusion after you get home. These symptoms may be an emergency. Get help right away. Call 911. Do not wait to see if the symptoms will go away. Do not drive yourself to the hospital. This information is not intended to replace advice given to you by your health care provider. Make sure you discuss any questions you have with your health care provider. Document Revised: 12/11/2021 Document Reviewed: 12/11/2021 Elsevier Patient Education  2024 [employer].

## 2023-01-09 ENCOUNTER — Encounter (HOSPITAL_COMMUNITY)
Admission: RE | Admit: 2023-01-09 | Discharge: 2023-01-09 | Disposition: A | Discharge status: Home / Self Care | Payer: Medicare Other | Source: Ambulatory Visit | Attending: Internal Medicine | Admitting: Internal Medicine

## 2023-01-09 VITALS — BP 163/70 | HR 79 | Temp 98.4°F | Resp 18 | Ht 67.0 in | Wt 213.0 lb

## 2023-01-09 DIAGNOSIS — R008 Other abnormalities of heart beat: Secondary | ICD-10-CM | POA: Insufficient documentation

## 2023-01-09 DIAGNOSIS — I10 Essential (primary) hypertension: Secondary | ICD-10-CM | POA: Insufficient documentation

## 2023-01-09 DIAGNOSIS — Z01810 Encounter for preprocedural cardiovascular examination: Secondary | ICD-10-CM | POA: Insufficient documentation

## 2023-01-11 ENCOUNTER — Ambulatory Visit (HOSPITAL_COMMUNITY): Payer: Medicare Other | Admitting: Anesthesiology

## 2023-01-11 ENCOUNTER — Encounter (HOSPITAL_COMMUNITY)
Admission: RE | Disposition: A | Discharge status: Home / Self Care | Payer: Self-pay | Source: Home / Self Care | Attending: Internal Medicine

## 2023-01-11 ENCOUNTER — Ambulatory Visit (HOSPITAL_COMMUNITY)
Admission: RE | Admit: 2023-01-11 | Discharge: 2023-01-11 | Disposition: A | Discharge status: Home / Self Care | Payer: Medicare Other | Attending: Internal Medicine | Admitting: Internal Medicine

## 2023-01-11 DIAGNOSIS — R933 Abnormal findings on diagnostic imaging of other parts of digestive tract: Secondary | ICD-10-CM

## 2023-01-11 DIAGNOSIS — K219 Gastro-esophageal reflux disease without esophagitis: Secondary | ICD-10-CM | POA: Diagnosis not present

## 2023-01-11 DIAGNOSIS — K319 Disease of stomach and duodenum, unspecified: Secondary | ICD-10-CM | POA: Diagnosis not present

## 2023-01-11 DIAGNOSIS — I10 Essential (primary) hypertension: Secondary | ICD-10-CM | POA: Diagnosis not present

## 2023-01-11 DIAGNOSIS — E119 Type 2 diabetes mellitus without complications: Secondary | ICD-10-CM

## 2023-01-11 DIAGNOSIS — E785 Hyperlipidemia, unspecified: Secondary | ICD-10-CM | POA: Diagnosis not present

## 2023-01-11 DIAGNOSIS — Z87891 Personal history of nicotine dependence: Secondary | ICD-10-CM | POA: Diagnosis not present

## 2023-01-11 DIAGNOSIS — Z794 Long term (current) use of insulin: Secondary | ICD-10-CM | POA: Insufficient documentation

## 2023-01-11 DIAGNOSIS — R1310 Dysphagia, unspecified: Secondary | ICD-10-CM | POA: Diagnosis present

## 2023-01-11 DIAGNOSIS — K2970 Gastritis, unspecified, without bleeding: Secondary | ICD-10-CM

## 2023-01-11 DIAGNOSIS — Z9641 Presence of insulin pump (external) (internal): Secondary | ICD-10-CM | POA: Diagnosis not present

## 2023-01-11 DIAGNOSIS — Z7984 Long term (current) use of oral hypoglycemic drugs: Secondary | ICD-10-CM

## 2023-01-11 DIAGNOSIS — K449 Diaphragmatic hernia without obstruction or gangrene: Secondary | ICD-10-CM | POA: Insufficient documentation

## 2023-01-11 DIAGNOSIS — G4700 Insomnia, unspecified: Secondary | ICD-10-CM | POA: Insufficient documentation

## 2023-01-11 DIAGNOSIS — F32A Depression, unspecified: Secondary | ICD-10-CM | POA: Insufficient documentation

## 2023-01-11 DIAGNOSIS — K2289 Other specified disease of esophagus: Secondary | ICD-10-CM | POA: Diagnosis not present

## 2023-01-11 DIAGNOSIS — Z79899 Other long term (current) drug therapy: Secondary | ICD-10-CM | POA: Diagnosis not present

## 2023-01-11 DIAGNOSIS — G8929 Other chronic pain: Secondary | ICD-10-CM | POA: Insufficient documentation

## 2023-01-11 HISTORY — PX: BIOPSY: SHX5522

## 2023-01-11 HISTORY — PX: ESOPHAGOGASTRODUODENOSCOPY (EGD) WITH PROPOFOL: SHX5813

## 2023-01-11 LAB — GLUCOSE, CAPILLARY: Glucose-Capillary: 154 mg/dL — ABNORMAL HIGH (ref 70–99)

## 2023-01-11 SURGERY — ESOPHAGOGASTRODUODENOSCOPY (EGD) WITH PROPOFOL
Anesthesia: General

## 2023-01-11 MED ORDER — PROPOFOL 10 MG/ML IV BOLUS
INTRAVENOUS | Status: DC | PRN
  Administered 2023-01-11: 50 mg via INTRAVENOUS
  Administered 2023-01-11: 75 mg via INTRAVENOUS
  Administered 2023-01-11: 100 mg via INTRAVENOUS

## 2023-01-11 MED ORDER — LACTATED RINGERS IV SOLN: INTRAVENOUS | Status: DC

## 2023-01-11 MED ORDER — PANTOPRAZOLE SODIUM 40 MG PO TBEC
40.0000 mg | DELAYED_RELEASE_TABLET | Freq: Two times a day (BID) | ORAL | 3 refills | Status: DC
Start: 2023-01-11 — End: 2023-11-20

## 2023-01-11 NOTE — H&P (Signed)
Primary Care Physician:  Darrow Bussing, MD Primary Gastroenterologist:  Dr. Marletta Lor  Pre-Procedure History & Physical: HPI:  [name] is a 67 y.o. female is here for an EGD with possible dilation to be performed for GERD dysphagia, abnormal CT  Past Medical History:  Diagnosis Date   Adenocarcinoma of right lung, stage 1 (HCC) 11/29/2016   Cancer (HCC)    Chronic pain    Depression    takes Lexapro daily   Diabetes mellitus    takes Metformin daily and has an insulin pump.Fasting blood sugar [id number]   GERD (gastroesophageal reflux disease)    takes Pantoprazole daily   History of colon polyps    benign   Hyperlipidemia    takes Atorvastatin daily   Hypertension    takes Amlodipine and Lisinopril daily   Insomnia    takes Trazodone nightly   Nocturia    Thyroid disease    Urinary frequency    Urinary urgency     Past Surgical History:  Procedure Laterality Date   ABDOMINAL HYSTERECTOMY     ABDOMINAL SURGERY     BIOPSY  05/28/2019   Procedure: BIOPSY;  Surgeon: West Bali, MD;  [facility]: [facility];  Service: Endoscopy;;  random colon/gastric/duodenum   COLONOSCOPY  2005 MAC   TICs, IH   COLONOSCOPY N/A 03/08/2014   Procedure: COLONOSCOPY;  Surgeon: West Bali, MD;  Location: [facility];  Service: Endoscopy;  Laterality: N/A;  1015   COLONOSCOPY WITH PROPOFOL N/A 05/28/2019   Procedure: COLONOSCOPY WITH PROPOFOL;  Surgeon: West Bali, MD;  Location: [facility];  Service: Endoscopy;  Laterality: N/A;  9:15am   CYSTOSTOMY  11/22/2011   Procedure: CYSTOSTOMY SUPRAPUBIC;  Surgeon: Ky Barban, MD;  Location: [facility];  Service: Urology;  Laterality: N/A;   ESOPHAGOGASTRODUODENOSCOPY N/A 03/08/2014   Procedure: ESOPHAGOGASTRODUODENOSCOPY (EGD);  Surgeon: West Bali, MD;  [facility];  Service: Endoscopy;  Laterality: N/A;   ESOPHAGOGASTRODUODENOSCOPY     ESOPHAGOGASTRODUODENOSCOPY (EGD) WITH PROPOFOL N/A 05/28/2019   Procedure:  ESOPHAGOGASTRODUODENOSCOPY (EGD) WITH PROPOFOL;  Surgeon: West Bali, MD;  [facility];  Service: Endoscopy;  Laterality: N/A;   GIVENS CAPSULE STUDY N/A 06/15/2019   Procedure: GIVENS CAPSULE STUDY;  Surgeon: West Bali, MD;  [facility]: [facility];  Service: Endoscopy;  Laterality: N/A;  7:30am   HALLUX VALGUS CORRECTION     bilateral foot surgery for bone repairs-multiple   LOBECTOMY Right 10/15/2016   Procedure: RIGHT UPPER LOBECTOMY;  Surgeon: Loreli Slot, MD;  Location: [facility] OR;  Service: Thoracic;  Laterality: Right;   LYMPH NODE DISSECTION Right 10/15/2016   Procedure: LYMPH NODE DISSECTION;  Surgeon: Loreli Slot, MD;  Location: [facility] OR;  Service: Thoracic;  Laterality: Right;   POLYPECTOMY  05/28/2019   Procedure: POLYPECTOMY;  Surgeon: West Bali, MD;  [facility];  Service: Endoscopy;;   SAVORY DILATION N/A 05/28/2019   Procedure: SAVORY DILATION;  Surgeon: West Bali, MD;  [facility]: [facility];  Service: Endoscopy;  Laterality: N/A;  15/16/17   VAGINA RECONSTRUCTION SURGERY     tvt 2006- [place]   VIDEO ASSISTED THORACOSCOPY (VATS)/WEDGE RESECTION Right 10/15/2016   Procedure: RIGHT VIDEO ASSISTED THORACOSCOPY (VATS)/WEDGE RESECTION;  Surgeon: Loreli Slot, MD;  Location: [facility] OR;  Service: Thoracic;  Laterality: Right;    Prior to Admission medications   Medication Sig Start Date End Date Taking? Authorizing Provider  acetaminophen (TYLENOL)  500 MG tablet Take 1 tablet (500 mg total) by mouth every 6 (six) hours as needed. Patient taking differently: Take 500 mg by mouth every 6 (six) hours as needed (for pain). 08/07/17  Yes Horton, Mayer Masker, MD  atorvastatin (LIPITOR) 20 MG tablet Take 20 mg by mouth daily. 04/27/17  Yes [provider]  chlorthalidone (HYGROTON) 25 MG tablet Take 12.5 mg by mouth daily.  11/26/16  Yes [provider]  clobetasol cream (TEMOVATE) 0.05 % Apply 1 Application  topically 2 (two) times daily as needed (itching).   Yes [provider]  diclofenac Sodium (VOLTAREN ARTHRITIS PAIN) 1 % GEL Apply 4 g topically 4 (four) times daily. Patient taking differently: Apply 4 g topically 3 (three) times daily as needed (joint pain). 05/15/22  Yes Leath-Warren, Sadie Haber, NP  escitalopram (LEXAPRO) 10 MG tablet Take 1 tablet (10 mg total) by mouth every morning. 09/13/22  Yes Myrlene Broker, MD  ibuprofen (ADVIL) 800 MG tablet Take 800 mg by mouth every 8 (eight) hours as needed for moderate pain.   Yes [provider]  insulin glargine (LANTUS SOLOSTAR) 100 UNIT/ML Solostar Pen Inject 25 Units into the skin at bedtime. 11/21/22  Yes Reardon, Freddi Starr, NP  lisinopril (ZESTRIL) 10 MG tablet Take 10 mg by mouth daily. 05/30/20  Yes [provider]  metFORMIN (GLUCOPHAGE-XR) 500 MG 24 hr tablet Take 1 tablet (500 mg total) by mouth daily with breakfast. 04/17/22  Yes Reardon, Whitney J, NP  pantoprazole (PROTONIX) 40 MG tablet TAKE 1 TABLET BY MOUTH TWICE A DAY 30 MINS BEFORE YOUR FIRST AND LAST MEAL. Patient taking differently: Take 40 mg by mouth daily. TAKE 1 TABLET BY MOUTH ONCE A DAY 30 MINS BEFORE YOUR FIRST MEAL 12/17/22  Yes Tiffany Kocher, PA-C  tirzepatide [facility]) 15 MG/0.5ML Pen Inject 15 mg into the skin once a week. 08/21/22  Yes Dani Gobble, NP  traZODone (DESYREL) 100 MG tablet Take 2 tablets (200 mg total) by mouth at bedtime. 09/13/22  Yes Myrlene Broker, MD  triamcinolone cream (KENALOG) 0.1 % Apply 1 application topically 2 (two) times daily. Patient taking differently: Apply 1 application  topically 2 (two) times daily as needed (irritation). 03/30/21  Yes Wurst, [place], PA-C  Vitamin D, Ergocalciferol, (DRISDOL) 1.25 MG (50000 UNIT) CAPS capsule Take 1 capsule (50,000 Units total) by mouth every 7 (seven) days. 11/21/22  Yes Dani Gobble, NP  ACCU-CHEK AVIVA PLUS test strip USE TO MONITOR GLUCOSE LEVELS TWICE A DAY  E11.9 11/27/21   Romero Belling, MD  ACCU-CHEK SOFTCLIX LANCETS lancets Use to monitor glucose levels BID; E11.9 Patient taking differently: 1 each by Other route 2 (two) times daily. E11.9 08/21/18   Romero Belling, MD  Blood Glucose Calibration (GLUCOSE CONTROL) SOLN 1 Bottle by In Vitro route as needed. Use to calibrate Accu-Chek Aviva Plus device prn; please provide control solution compatible with Accu-Chek Aviva Plus device. Patient taking differently: 1 each by Other route as needed (Use to calibrate glucometer). E11.9 08/21/18   Romero Belling, MD  Blood Glucose Monitoring Suppl (ACCU-CHEK AVIVA PLUS) w/Device KIT 1 each by Does not apply route 2 (two) times daily. Use to monitor glucose levels BID; E11.9 Patient taking differently: 1 each by Does not apply route 2 (two) times daily. E11.9 08/19/18   Romero Belling, MD  Insulin Pen Needle (PEN NEEDLES) 31G X 8 MM MISC Use As Directed 08/14/21   Romero Belling, MD    Allergies as  of 12/06/2022 - Review Complete 12/04/2022  Allergen Reaction Noted   Synthroid [levothyroxine sodium] Anaphylaxis 11/29/2011   Hydrochlorothiazide  09/13/2020   Iodine Rash 11/12/2011    Family History  Problem Relation Age of Onset   Asthma Other    Diabetes Other    Arthritis Other    Cancer Mother    Cancer Brother    Anesthesia problems Neg Hx    Hypotension Neg Hx    Malignant hyperthermia Neg Hx    Pseudochol deficiency Neg Hx    Colon polyps Neg Hx    Colon cancer Neg Hx    Celiac disease Neg Hx    Pancreatic cancer Neg Hx    Stomach cancer Neg Hx    Ulcerative colitis Neg Hx    Crohn's disease Neg Hx     Social History   Socioeconomic History   Marital status: Single    Spouse name: Not on file   Number of children: Not on file   Years of education: Not on file   Highest education level: Not on file  Occupational History   Not on file  Tobacco Use   Smoking status: Former    Packs/day: 1.00    Years: 32.00    Additional pack years:  0.00    Total pack years: 32.00    Types: Cigarettes   Smokeless tobacco: Never   Tobacco comments:    quit smoking 20 yrs ago  Vaping Use   Vaping Use: Never used  Substance and Sexual Activity   Alcohol use: No    Comment: rarely   Drug use: No   Sexual activity: Not on file  Other Topics Concern   Not on file  Social History Narrative   Not on file   Social Determinants of Health   Financial Resource Strain: Not on file  Food Insecurity: Not on file  Transportation Needs: Not on file  Physical Activity: Not on file  Stress: Not on file  Social Connections: Not on file  Intimate Partner Violence: Not on file    Review of Systems: General: Negative for fever, chills, fatigue, weakness. Eyes: Negative for vision changes.  ENT: Negative for hoarseness, difficulty swallowing , nasal congestion. CV: Negative for chest pain, angina, palpitations, dyspnea on exertion, peripheral edema.  Respiratory: Negative for dyspnea at rest, dyspnea on exertion, cough, sputum, wheezing.  GI: See history of present illness. GU:  Negative for dysuria, hematuria, urinary incontinence, urinary frequency, nocturnal urination.  MS: Negative for joint pain, low back pain.  Derm: Negative for rash or itching.  Neuro: Negative for weakness, abnormal sensation, seizure, frequent headaches, memory loss, confusion.  Psych: Negative for anxiety, depression Endo: Negative for unusual weight change.  Heme: Negative for bruising or bleeding. Allergy: Negative for rash or hives.  Physical Exam: Vital signs in last 24 hours: Temp:  [97.7 F (36.5 C)] 97.7 F (36.5 C) (06/14 0803) Pulse Rate:  [74] 74 (06/14 0803) Resp:  [16] 16 (06/14 0803) BP: (153)/(77) 153/77 (06/14 0803) SpO2:  [94 %] 94 % (06/14 0803)   General:   Alert,  Well-developed, well-nourished, pleasant and cooperative in NAD Head:  Normocephalic and atraumatic. Eyes:  Sclera clear, no icterus.   Conjunctiva pink. Ears:  Normal  auditory acuity. Nose:  No deformity, discharge,  or lesions. Msk:  Symmetrical without gross deformities. Normal posture. Extremities:  Without clubbing or edema. Neurologic:  Alert and  oriented x4;  grossly normal neurologically. Skin:  Intact without significant lesions or  rashes. Psych:  Alert and cooperative. Normal mood and affect.   Impression/Plan: [name] is here for an EGD with possible dilation to be performed for GERD dysphagia, abnormal CT  Risks, benefits, limitations, imponderables and alternatives regarding EGD have been reviewed with the patient. Questions have been answered. All parties agreeable.

## 2023-01-11 NOTE — Anesthesia Postprocedure Evaluation (Signed)
Anesthesia Post Note  Patient: MAHATI HARVAN  Procedure(s) Performed: ESOPHAGOGASTRODUODENOSCOPY (EGD) WITH PROPOFOL BIOPSY  Patient location during evaluation: Phase II Anesthesia Type: General Level of consciousness: awake and alert and oriented Pain management: pain level controlled Vital Signs Assessment: post-procedure vital signs reviewed and stable Respiratory status: spontaneous breathing, nonlabored ventilation and respiratory function stable Cardiovascular status: blood pressure returned to baseline and stable Postop Assessment: no apparent nausea or vomiting Anesthetic complications: no  No notable events documented.   Last Vitals:  Vitals:   01/11/23 0803 01/11/23 1131  BP: (!) 153/77 (!) 120/47  Pulse: 74 76  Resp: 16 17  Temp: 36.5 C 36.4 C  SpO2: 94% 94%    Last Pain:  Vitals:   01/11/23 1131  TempSrc: Oral  PainSc: 0-No pain                 Raeghan Demeter C Deoni Cosey

## 2023-01-11 NOTE — Op Note (Signed)
[facility] Patient Name: [name] Procedure Date: 01/11/2023 11:02 AM MRN: [id number] Date of Birth: Aug 03, 1955 Attending MD: Hennie Duos. Marletta Lor , [place], [id number] CSN: [id number] Age: 67 Admit Type: Outpatient Procedure:                Upper GI endoscopy Indications:              Dysphagia, Heartburn Providers:                Hennie Duos. Marletta Lor, DO, Sheran Fava, Lennice Sites Technician, Technician Referring MD:             Hennie Duos. Marletta Lor, DO Medicines:                See the Anesthesia note for documentation of the                            administered medications Complications:            No immediate complications. Estimated Blood Loss:     Estimated blood loss was minimal. Procedure:                Pre-Anesthesia Assessment:                           - The anesthesia plan was to use monitored                            anesthesia care (MAC).                           After obtaining informed consent, the endoscope was                            passed under direct vision. Throughout the                            procedure, the patient's blood pressure, pulse, and                            oxygen saturations were monitored continuously. The                            [id number]-H190 ([id number]) scope was introduced through the                            mouth, and advanced to the second part of duodenum.                            The upper GI endoscopy was accomplished without                            difficulty. The patient tolerated the procedure                            well. Scope  In: 11:20:05 AM Scope Out: 11:24:29 AM Total Procedure Duration: 0 hours 4 minutes 24 seconds  Findings:      A small hiatal hernia was present.      The Z-line was irregular. Biopsies were taken with a cold forceps for       histology.      Patchy mild inflammation characterized by erythema was found in the       entire examined stomach. Biopsies were  taken with a cold forceps for       Helicobacter pylori testing.      The duodenal bulb, first portion of the duodenum and second portion of       the duodenum were normal. Impression:               - Small hiatal hernia.                           - Z-line irregular. Biopsied.                           - Gastritis. Biopsied.                           - Normal duodenal bulb, first portion of the                            duodenum and second portion of the duodenum. Moderate Sedation:      Per Anesthesia Care Recommendation:           - Patient has a contact number available for                            emergencies. The signs and symptoms of potential                            delayed complications were discussed with the                            patient. Return to normal activities tomorrow.                            Written discharge instructions were provided to the                            patient.                           - Resume previous diet.                           - Continue present medications.                           - Await pathology results.                           - Use Protonix (pantoprazole) 40 mg PO BID for 12  weeks.                           - Return to GI clinic in 3 months. Procedure Code(s):        --- Professional ---                           [phone], Esophagogastroduodenoscopy, flexible,                            transoral; with biopsy, single or multiple Diagnosis Code(s):        --- Professional ---                           K44.9, Diaphragmatic hernia without obstruction or                            gangrene                           K22.89, Other specified disease of esophagus                           K29.70, Gastritis, unspecified, without bleeding                           R13.10, Dysphagia, unspecified                           R12, Heartburn CPT copyright 2022 [employer]. All rights  reserved. The codes documented in this report are preliminary and upon coder review may  be revised to meet current compliance requirements. Hennie Duos. Marletta Lor, DO Hennie Duos. Marletta Lor, DO 01/11/2023 11:43:08 AM This report has been signed electronically. Number of Addenda: 0

## 2023-01-11 NOTE — Anesthesia Preprocedure Evaluation (Signed)
Anesthesia Evaluation  Patient identified by MRN, date of birth, ID band Patient awake    Reviewed: Allergy & Precautions, H&P , NPO status , Patient's Chart, lab work & pertinent test results  Airway Mallampati: II  TM Distance: >3 FB Neck ROM: Full    Dental  (+) Dental Advisory Given, Implants   Pulmonary former smoker Right lung adenocarcinoma, lobectomy    Pulmonary exam normal breath sounds clear to auscultation       Cardiovascular hypertension, Pt. on medications Normal cardiovascular exam+ dysrhythmias (PVCs)  Rhythm:Regular Rate:Normal     Neuro/Psych  PSYCHIATRIC DISORDERS  Depression    negative neurological ROS     GI/Hepatic Neg liver ROS,GERD  Medicated,,  Endo/Other  diabetes, Well Controlled, Type 2, Oral Hypoglycemic Agents    Renal/GU negative Renal ROS  negative genitourinary   Musculoskeletal negative musculoskeletal ROS (+)    Abdominal   Peds negative pediatric ROS (+)  Hematology  (+) Blood dyscrasia, anemia   Anesthesia Other Findings   Reproductive/Obstetrics negative OB ROS                             Anesthesia Physical Anesthesia Plan  ASA: 3  Anesthesia Plan: General   Post-op Pain Management: Minimal or no pain anticipated   Induction: Intravenous  PONV Risk Score and Plan: 1 and Propofol infusion  Airway Management Planned: Nasal Cannula and Natural Airway  Additional Equipment:   Intra-op Plan:   Post-operative Plan:   Informed Consent: I have reviewed the patients History and Physical, chart, labs and discussed the procedure including the risks, benefits and alternatives for the proposed anesthesia with the patient or authorized representative who has indicated his/her understanding and acceptance.     Dental advisory given  Plan Discussed with: CRNA and Surgeon  Anesthesia Plan Comments:         Anesthesia Quick Evaluation

## 2023-01-11 NOTE — Discharge Instructions (Signed)
EGD Discharge instructions Please read the instructions outlined below and refer to this sheet in the next few weeks. These discharge instructions provide you with general information on caring for yourself after you leave the hospital. Your doctor may also give you specific instructions. While your treatment has been planned according to the most current medical practices available, unavoidable complications occasionally occur. If you have any problems or questions after discharge, please call your doctor. ACTIVITY You may resume your regular activity but move at a slower pace for the next 24 hours.  Take frequent rest periods for the next 24 hours.  Walking will help expel (get rid of) the air and reduce the bloated feeling in your abdomen.  No driving for 24 hours (because of the anesthesia (medicine) used during the test).  You may shower.  Do not sign any important legal documents or operate any machinery for 24 hours (because of the anesthesia used during the test).  NUTRITION Drink plenty of fluids.  You may resume your normal diet.  Begin with a light meal and progress to your normal diet.  Avoid alcoholic beverages for 24 hours or as instructed by your caregiver.  MEDICATIONS You may resume your normal medications unless your caregiver tells you otherwise.  WHAT YOU CAN EXPECT TODAY You may experience abdominal discomfort such as a feeling of fullness or "gas" pains.  FOLLOW-UP Your doctor will discuss the results of your test with you.  SEEK IMMEDIATE MEDICAL ATTENTION IF ANY OF THE FOLLOWING OCCUR: Excessive nausea (feeling sick to your stomach) and/or vomiting.  Severe abdominal pain and distention (swelling).  Trouble swallowing.  Temperature over 101 F (37.8 C).  Rectal bleeding or vomiting of blood.   Your EGD revealed mild amount inflammation in your stomach.  I took biopsies of this to rule out infection with a bacteria called H. pylori.  Await pathology results, my  office will contact you.  You have a hiatal hernia.  I took samples of your esophagus as well to rule out Barrett's esophagus.  No obvious strictures or tightenings of your esophagus and I did not need to dilate today.  Small bowel appeared normal I am going to increase your pantoprazole to twice daily for the next 10 to 12 weeks.  Follow-up in GI 2 to 3 months.   I hope you have a great rest of your week!  Hennie Duos. Marletta Lor, D.O. Gastroenterology and Hepatology [facility]

## 2023-01-11 NOTE — Transfer of Care (Signed)
Immediate Anesthesia Transfer of Care Note  Patient: [name]  Procedure(s) Performed: ESOPHAGOGASTRODUODENOSCOPY (EGD) WITH PROPOFOL BIOPSY  Patient Location: Short Stay  Anesthesia Type:General  Level of Consciousness: awake  Airway & Oxygen Therapy: Patient Spontanous Breathing  Post-op Assessment: Report given to RN  Post vital signs: Reviewed and stable  Last Vitals:  Vitals Value Taken Time  BP    Temp    Pulse    Resp    SpO2      Last Pain:  Vitals:   01/11/23 1114  PainSc: 0-No pain         Complications: No notable events documented.

## 2023-01-14 LAB — SURGICAL PATHOLOGY

## 2023-01-17 ENCOUNTER — Encounter (HOSPITAL_COMMUNITY): Payer: Self-pay | Admitting: Internal Medicine

## 2023-01-17 ENCOUNTER — Other Ambulatory Visit: Payer: Self-pay | Admitting: Nurse Practitioner

## 2023-01-17 DIAGNOSIS — E119 Type 2 diabetes mellitus without complications: Secondary | ICD-10-CM

## 2023-02-21 ENCOUNTER — Ambulatory Visit: Payer: Medicare Other | Admitting: Nurse Practitioner

## 2023-02-21 ENCOUNTER — Encounter: Payer: Self-pay | Admitting: Nurse Practitioner

## 2023-02-21 VITALS — BP 124/70 | HR 71 | Ht 67.0 in | Wt 206.8 lb

## 2023-02-21 DIAGNOSIS — E782 Mixed hyperlipidemia: Secondary | ICD-10-CM | POA: Diagnosis not present

## 2023-02-21 DIAGNOSIS — Z7984 Long term (current) use of oral hypoglycemic drugs: Secondary | ICD-10-CM | POA: Diagnosis not present

## 2023-02-21 DIAGNOSIS — E559 Vitamin D deficiency, unspecified: Secondary | ICD-10-CM

## 2023-02-21 DIAGNOSIS — E119 Type 2 diabetes mellitus without complications: Secondary | ICD-10-CM | POA: Diagnosis not present

## 2023-02-21 DIAGNOSIS — Z794 Long term (current) use of insulin: Secondary | ICD-10-CM

## 2023-02-21 DIAGNOSIS — Z7985 Long-term (current) use of injectable non-insulin antidiabetic drugs: Secondary | ICD-10-CM

## 2023-02-21 DIAGNOSIS — I10 Essential (primary) hypertension: Secondary | ICD-10-CM | POA: Diagnosis not present

## 2023-02-21 LAB — POCT GLYCOSYLATED HEMOGLOBIN (HGB A1C): Hemoglobin A1C: 6.6 % — AB (ref 4.0–5.6)

## 2023-02-21 NOTE — Progress Notes (Signed)
Endocrinology Follow Up Note       02/21/2023, 8:18 AM   Subjective:    Patient ID: [name], female    DOB: 08-27-1955.  [name] is being seen in follow up after being seen in consultation for management of currently uncontrolled symptomatic diabetes requested by  Darrow Bussing, MD.   Past Medical History:  Diagnosis Date   Adenocarcinoma of right lung, stage 1 (HCC) 11/29/2016   Cancer (HCC)    Chronic pain    Depression    takes Lexapro daily   Diabetes mellitus    takes Metformin daily and has an insulin pump.Fasting blood sugar runs250   GERD (gastroesophageal reflux disease)    takes Pantoprazole daily   History of colon polyps    benign   Hyperlipidemia    takes Atorvastatin daily   Hypertension    takes Amlodipine and Lisinopril daily   Insomnia    takes Trazodone nightly   Nocturia    Thyroid disease    Urinary frequency    Urinary urgency     Past Surgical History:  Procedure Laterality Date   ABDOMINAL HYSTERECTOMY     ABDOMINAL SURGERY     BIOPSY  05/28/2019   Procedure: BIOPSY;  Surgeon: West Bali, MD;  [facility]: [facility];  Service: Endoscopy;;  random colon/gastric/duodenum   BIOPSY  01/11/2023   Procedure: BIOPSY;  Surgeon: Lanelle Bal, DO;  [facility]: [facility];  Service: Endoscopy;;   COLONOSCOPY  2005 MAC   TICs, IH   COLONOSCOPY N/A 03/08/2014   Procedure: COLONOSCOPY;  Surgeon: West Bali, MD;  [facility]: [facility];  Service: Endoscopy;  Laterality: N/A;  1015   COLONOSCOPY WITH PROPOFOL N/A 05/28/2019   Procedure: COLONOSCOPY WITH PROPOFOL;  Surgeon: West Bali, MD;  [facility]: [facility];  Service: Endoscopy;  Laterality: N/A;  9:15am   CYSTOSTOMY  11/22/2011   Procedure: CYSTOSTOMY SUPRAPUBIC;  Surgeon: Ky Barban, MD;  Location: [facility];  Service: Urology;  Laterality: N/A;   ESOPHAGOGASTRODUODENOSCOPY N/A 03/08/2014    Procedure: ESOPHAGOGASTRODUODENOSCOPY (EGD);  Surgeon: West Bali, MD;  [facility];  Service: Endoscopy;  Laterality: N/A;   ESOPHAGOGASTRODUODENOSCOPY     ESOPHAGOGASTRODUODENOSCOPY (EGD) WITH PROPOFOL N/A 05/28/2019   Procedure: ESOPHAGOGASTRODUODENOSCOPY (EGD) WITH PROPOFOL;  Surgeon: West Bali, MD;  [facility];  Service: Endoscopy;  Laterality: N/A;   ESOPHAGOGASTRODUODENOSCOPY (EGD) WITH PROPOFOL N/A 01/11/2023   Procedure: ESOPHAGOGASTRODUODENOSCOPY (EGD) WITH PROPOFOL;  Surgeon: Lanelle Bal, DO;  [facility];  Service: Endoscopy;  Laterality: N/A;  9:45 am, asa 3   GIVENS CAPSULE STUDY N/A 06/15/2019   Procedure: GIVENS CAPSULE STUDY;  Surgeon: West Bali, MD;  [facility];  Service: Endoscopy;  Laterality: N/A;  7:30am   HALLUX VALGUS CORRECTION     bilateral foot surgery for bone repairs-multiple   LOBECTOMY Right 10/15/2016   Procedure: RIGHT UPPER LOBECTOMY;  Surgeon: Loreli Slot, MD;  Location[facility];  Service: Thoracic;  Laterality: Right;   LYMPH NODE DISSECTION Right 10/15/2016   Procedure: LYMPH NODE DISSECTION;  Surgeon: Loreli Slot, MD;  Location:  [facility] OR;  Service: Thoracic;  Laterality: Right;   POLYPECTOMY  05/28/2019   Procedure: POLYPECTOMY;  Surgeon: West Bali, MD;  [facility];  Service: Endoscopy;;   SAVORY DILATION N/A 05/28/2019   Procedure: SAVORY DILATION;  Surgeon: West Bali, MD;  [facility]: [facility];  Service: Endoscopy;  Laterality: N/A;  15/16/17   VAGINA RECONSTRUCTION SURGERY     tvt 2006- [facility]   VIDEO ASSISTED THORACOSCOPY (VATS)/WEDGE RESECTION Right 10/15/2016   Procedure: RIGHT VIDEO ASSISTED THORACOSCOPY (VATS)/WEDGE RESECTION;  Surgeon: Loreli Slot, MD;  Location: [facility];  Service: Thoracic;  Laterality: Right;    Social History   Socioeconomic History   Marital status: Single    Spouse name: Not on file   Number of  children: Not on file   Years of education: Not on file   Highest education level: Not on file  Occupational History   Not on file  Tobacco Use   Smoking status: Former    Current packs/day: 1.00    Average packs/day: 1 pack/day for 32.0 years (32.0 ttl pk-yrs)    Types: Cigarettes   Smokeless tobacco: Never   Tobacco comments:    quit smoking 20 yrs ago  Vaping Use   Vaping status: Never Used  Substance and Sexual Activity   Alcohol use: No    Comment: rarely   Drug use: No   Sexual activity: Not on file  Other Topics Concern   Not on file  Social History Narrative   Not on file   Social Determinants of Health   Financial Resource Strain: Not on file  Food Insecurity: Not on file  Transportation Needs: Not on file  Physical Activity: Not on file  Stress: Not on file  Social Connections: Not on file    Family History  Problem Relation Age of Onset   Asthma Other    Diabetes Other    Arthritis Other    Cancer Mother    Cancer Brother    Anesthesia problems Neg Hx    Hypotension Neg Hx    Malignant hyperthermia Neg Hx    Pseudochol deficiency Neg Hx    Colon polyps Neg Hx    Colon cancer Neg Hx    Celiac disease Neg Hx    Pancreatic cancer Neg Hx    Stomach cancer Neg Hx    Ulcerative colitis Neg Hx    Crohn's disease Neg Hx     Outpatient Encounter Medications as of 02/21/2023  Medication Sig   ACCU-CHEK AVIVA PLUS test strip USE TO MONITOR GLUCOSE LEVELS TWICE A DAY E11.9   ACCU-CHEK SOFTCLIX LANCETS lancets Use to monitor glucose levels BID; E11.9 (Patient taking differently: 1 each by Other route 2 (two) times daily. E11.9)   acetaminophen (TYLENOL) 500 MG tablet Take 1 tablet (500 mg total) by mouth every 6 (six) hours as needed. (Patient taking differently: Take 500 mg by mouth every 6 (six) hours as needed (for pain).)   atorvastatin (LIPITOR) 20 MG tablet Take 20 mg by mouth daily.   Blood Glucose Calibration (GLUCOSE CONTROL) SOLN 1 Bottle by In  Vitro route as needed. Use to calibrate Accu-Chek Aviva Plus device prn; please provide control solution compatible with Accu-Chek Aviva Plus device. (Patient taking differently: 1 each by Other route as needed (Use to calibrate glucometer). E11.9)   Blood Glucose Monitoring Suppl (ACCU-CHEK AVIVA PLUS) w/Device KIT 1 each by Does not apply route 2 (two) times daily. Use to monitor  glucose levels BID; E11.9 (Patient taking differently: 1 each by Does not apply route 2 (two) times daily. E11.9)   chlorthalidone (HYGROTON) 25 MG tablet Take 12.5 mg by mouth daily.    clobetasol cream (TEMOVATE) 0.05 % Apply 1 Application topically 2 (two) times daily as needed (itching).   diclofenac Sodium (VOLTAREN ARTHRITIS PAIN) 1 % GEL Apply 4 g topically 4 (four) times daily. (Patient taking differently: Apply 4 g topically 3 (three) times daily as needed (joint pain).)   escitalopram (LEXAPRO) 10 MG tablet Take 1 tablet (10 mg total) by mouth every morning.   ibuprofen (ADVIL) 800 MG tablet Take 800 mg by mouth every 8 (eight) hours as needed for moderate pain.   insulin glargine (LANTUS SOLOSTAR) 100 UNIT/ML Solostar Pen Inject 25 Units into the skin at bedtime.   Insulin Pen Needle (B-D ULTRAFINE III SHORT PEN) 31G X 8 MM MISC USE AS DIRECTED   lisinopril (ZESTRIL) 10 MG tablet Take 10 mg by mouth daily.   metFORMIN (GLUCOPHAGE-XR) 500 MG 24 hr tablet Take 1 tablet (500 mg total) by mouth daily with breakfast.   pantoprazole (PROTONIX) 40 MG tablet Take 1 tablet (40 mg total) by mouth 2 (two) times daily.   tirzepatide (MOUNJARO) 15 MG/0.5ML Pen Inject 15 mg into the skin once a week.   traZODone (DESYREL) 100 MG tablet Take 2 tablets (200 mg total) by mouth at bedtime.   triamcinolone cream (KENALOG) 0.1 % Apply 1 application topically 2 (two) times daily. (Patient taking differently: Apply 1 application  topically 2 (two) times daily as needed (irritation).)   [DISCONTINUED] Vitamin D, Ergocalciferol,  (DRISDOL) 1.25 MG (50000 UNIT) CAPS capsule Take 1 capsule (50,000 Units total) by mouth every 7 (seven) days.   No facility-administered encounter medications on file as of 02/21/2023.    ALLERGIES: Allergies  Allergen Reactions   Synthroid [Levothyroxine Sodium] Anaphylaxis   Hydrochlorothiazide     cramps   Iodine Rash    I get a skin rash sometimes if iodine applied to my skin.      VACCINATION STATUS:  There is no immunization history on file for this patient.  Diabetes She presents for her follow-up diabetic visit. She has type 2 diabetes mellitus. Onset time: diagnosed at approx age of 29. Her disease course has been stable. There are no hypoglycemic associated symptoms. There are no diabetic associated symptoms. There are no hypoglycemic complications. There are no diabetic complications. Risk factors for coronary artery disease include diabetes mellitus, obesity, hypertension and post-menopausal. Current diabetic treatment includes intensive insulin program and oral agent (monotherapy) (and Mounjaro). She is compliant with treatment all of the time. Her weight is decreasing steadily. She is following a generally healthy diet. Meal planning includes avoidance of concentrated sweets. She has not had a previous visit with a dietitian. She participates in exercise daily. Her home blood glucose trend is fluctuating minimally. Her overall blood glucose range is 140-180 mg/dl. (She presents today with her CGM showing mostly at goal glycemic profile.  Her POCT A1c today is 6.6%, unchanged from last visit.  She has lost another 5 lbs since last visit and generally feels really well.   Analysis of her CGM shows TIR 67%, TAR 33%, TBR 0%.  Only limited data available as she recently went out of town and sensor stopped working.) An ACE inhibitor/angiotensin II receptor blocker is being taken. She sees a podiatrist.Eye exam is current.     Review of systems  Constitutional: + steadily decreasing  body weight,  current Body mass index is 32.39 kg/m. , no fatigue, no subjective hyperthermia, no subjective hypothermia Eyes: no blurry vision, no xerophthalmia ENT: no sore throat, no nodules palpated in throat, no dysphagia/odynophagia, no hoarseness Cardiovascular: no chest pain, no shortness of breath, no palpitations, no leg swelling Respiratory: no cough, no shortness of breath Gastrointestinal: no nausea/vomiting/diarrhea Musculoskeletal: scattered joint pain- recently had steroid injection in neck Skin: no rashes, no hyperemia Neurological: no tremors, no numbness, no tingling, no dizziness Psychiatric: no depression, no anxiety  Objective:     BP 124/70 (BP Location: Right Arm, Patient Position: Sitting, Cuff Size: Large)   Pulse 71   Ht 5\' 7"  (1.702 m)   Wt 206 lb 12.8 oz (93.8 kg)   BMI 32.39 kg/m   Wt Readings from Last 3 Encounters:  02/21/23 206 lb 12.8 oz (93.8 kg)  01/09/23 212 lb 15.4 oz (96.6 kg)  12/04/22 213 lb (96.6 kg)     BP Readings from Last 3 Encounters:  02/21/23 124/70  01/11/23 (!) 120/47  01/09/23 (!) 163/70      Physical Exam- Limited  Constitutional:  Body mass index is 32.39 kg/m. , not in acute distress, normal state of mind Eyes:  EOMI, no exophthalmos Musculoskeletal: no gross deformities, strength intact in all four extremities, no gross restriction of joint movements Skin:  no rashes, no hyperemia Neurological: no tremor with outstretched hands  Diabetic Foot Exam - Simple   No data filed     CMP ( most recent) CMP     Component Value Date/Time   NA 142 10/29/2022 1158   NA 141 05/29/2017 0921   K 4.5 10/29/2022 1158   K 4.8 05/29/2017 0921   CL 104 10/29/2022 1158   CO2 23 10/29/2022 1158   CO2 28 05/29/2017 0921   GLUCOSE 103 (H) 10/29/2022 1158   GLUCOSE 111 (H) 06/12/2022 0908   GLUCOSE 184 (H) 05/29/2017 0921   BUN 21 10/29/2022 1158   BUN 13.5 05/29/2017 0921   CREATININE 0.82 10/29/2022 1158   CREATININE  0.76 06/12/2022 0908   CREATININE 0.8 05/29/2017 0921   CALCIUM 9.6 10/29/2022 1158   CALCIUM 9.5 05/29/2017 0921   PROT 6.2 10/29/2022 1158   PROT 7.1 05/29/2017 0921   ALBUMIN 4.3 10/29/2022 1158   ALBUMIN 80 mg/L 05/17/2022 0922   ALBUMIN 3.6 05/29/2017 0921   AST 17 10/29/2022 1158   AST 17 06/12/2022 0908   AST 17 05/29/2017 0921   ALT 17 10/29/2022 1158   ALT 20 06/12/2022 0908   ALT 20 05/29/2017 0921   ALKPHOS 90 10/29/2022 1158   ALKPHOS 104 05/29/2017 0921   BILITOT 0.4 10/29/2022 1158   BILITOT 0.5 06/12/2022 0908   BILITOT 0.40 05/29/2017 0921   GFRNONAA >60 06/12/2022 0908   GFRAA >60 12/08/2019 0849     Diabetic Labs (most recent): Lab Results  Component Value Date   HGBA1C 6.6 (A) 02/21/2023   HGBA1C 6.6 (A) 11/21/2022   HGBA1C 6.2 (A) 08/21/2022     Lipid Panel ( most recent) Lipid Panel     Component Value Date/Time   CHOL 146 10/29/2022 1158   TRIG 113 10/29/2022 1158   HDL 62 10/29/2022 1158   CHOLHDL 2.4 10/29/2022 1158   LDLCALC 64 10/29/2022 1158   LABVLDL 20 10/29/2022 1158      Lab Results  Component Value Date   TSH 0.871 10/29/2022   TSH 2.93 07/11/2016   FREET4 1.22 10/29/2022  Assessment & Plan:   1) Type 2 diabetes mellitus without complication, with long-term current use of insulin (HCC)  She presents today with her CGM showing mostly at goal glycemic profile.  Her POCT A1c today is 6.6%, unchanged from last visit.  She has lost another 5 lbs since last visit and generally feels really well.   Analysis of her CGM shows TIR 67%, TAR 33%, TBR 0%.  Only limited data available as she recently went out of town and sensor stopped working.  - [name] has currently uncontrolled symptomatic type 2 DM since 67 years of age.   -Recent labs reviewed.  - I had a long discussion with her about the progressive nature of diabetes and the pathology behind its complications. -her diabetes is not currently complicated  but she remains at a high risk for more acute and chronic complications which include CAD, CVA, CKD, retinopathy, and neuropathy. These are all discussed in detail with her.  The following Lifestyle Medicine recommendations according to American College of Lifestyle Medicine [facility]) were discussed and offered to patient and she agrees to start the journey:  A. Whole Foods, Plant-based plate comprising of fruits and vegetables, plant-based proteins, whole-grain carbohydrates was discussed in detail with the patient.   A list for source of those nutrients were also provided to the patient.  Patient will use only water or unsweetened tea for hydration. B.  The need to stay away from risky substances including alcohol, smoking; obtaining 7 to 9 hours of restorative sleep, at least 150 minutes of moderate intensity exercise weekly, the importance of healthy social connections,  and stress reduction techniques were discussed. C.  A full color page of  Calorie density of various food groups per pound showing examples of each food groups was provided to the patient.  - Nutritional counseling repeated at each appointment due to patients tendency to fall back in to old habits.  - The patient admits there is a room for improvement in their diet and drink choices. -  Suggestion is made for the patient to avoid simple carbohydrates from their diet including Cakes, Sweet Desserts / Pastries, Ice Cream, Soda (diet and regular), Sweet Tea, Candies, Chips, Cookies, Sweet Pastries, Store Bought Juices, Alcohol in Excess of 1-2 drinks a day, Artificial Sweeteners, Coffee Creamer, and "Sugar-free" Products. This will help patient to have stable blood glucose profile and potentially avoid unintended weight gain.   - I encouraged the patient to switch to unprocessed or minimally processed complex starch and increased protein intake (animal or plant source), fruits, and vegetables.   - Patient is advised to stick to a routine  mealtimes to eat 3 meals a day and avoid unnecessary snacks (to snack only to correct hypoglycemia).  - I have approached her with the following individualized plan to manage her diabetes and patient agrees:   -She is advised to continue her Lantus 25 units SQ nightly.  She can continue her Mounjaro 15 mg SQ weekly and Metformin 500 mg ER daily with breakfast for now.   -she is encouraged to start monitoring glucose 4 times daily (using her CGM), before meals and before bed, and to call the clinic if she has readings less than 70 or above 300 for 3 tests in a row.  - she is warned not to take insulin without proper monitoring per orders. - Adjustment parameters are given to her for hypo and hyperglycemia in writing.  - her Parlodel and Marcelline Deist was discontinued previously, due  to side effect profile and to deescalate her treatment plan.   - Specific targets for  A1c; LDL, HDL, and Triglycerides were discussed with the patient.  2) Blood Pressure /Hypertension:  her blood pressure is controlled to target.   she is advised to continue her current medications including Lisinopril 10 mg p.o. daily with breakfast, Chlorthalidone 25 mg po daily.  3) Lipids/Hyperlipidemia:    Her recent lipid panel from 10/29/22 shows controlled LDL of 64.  she is advised to continue Lipitor 20 mg daily at bedtime.  Side effects and precautions discussed with her.   4)  Weight/Diet:  her Body mass index is 32.39 kg/m.  -  clearly complicating her diabetes care.   she is a candidate for weight loss. I discussed with her the fact that loss of 5 - 10% of her  current body weight will have the most impact on her diabetes management.  Exercise, and detailed carbohydrates information provided  -  detailed on discharge instructions.  5)Vitamin D deficiency Her recent vitamin D level on 10/29/22 was 19.5.  I discussed and initiated Ergocalciferol 50000 units po weekly x 12 weeks, once finished she can switch over to OTC  Vitamin D3 5000 units daily as maintenance dose.    6) Chronic Care/Health Maintenance: -she is on ACEI/ARB and Statin medications and is encouraged to initiate and continue to follow up with Ophthalmology, Dentist, Podiatrist at least yearly or according to recommendations, and advised to stay away from smoking. I have recommended yearly flu vaccine and pneumonia vaccine at least every 5 years; moderate intensity exercise for up to 150 minutes weekly; and sleep for at least 7 hours a day.  - she is advised to maintain close follow up with Darrow Bussing, MD for primary care needs, as well as her other providers for optimal and coordinated care.     I spent  28  minutes in the care of the patient today including review of labs from CMP, Lipids, Thyroid Function, Hematology (current and previous including abstractions from other facilities); face-to-face time discussing  her blood glucose readings/logs, discussing hypoglycemia and hyperglycemia episodes and symptoms, medications doses, her options of short and long term treatment based on the latest standards of care / guidelines;  discussion about incorporating lifestyle medicine;  and documenting the encounter. Risk reduction counseling performed per USPSTF guidelines to reduce obesity and cardiovascular risk factors.     Please refer to Patient Instructions for Blood Glucose Monitoring and Insulin/Medications Dosing Guide"  in media tab for additional information. Please  also refer to " Patient Self Inventory" in the Media  tab for reviewed elements of pertinent patient history.  [name] participated in the discussions, expressed understanding, and voiced agreement with the above plans.  All questions were answered to her satisfaction. she is encouraged to contact clinic should she have any questions or concerns prior to her return visit.     Follow up plan: - Return in about 4 months (around 06/24/2023) for Diabetes F/U with A1c in  office, No previsit labs, Bring meter and logs.   Ronny Bacon, [facility] [address] [place], [place] [address] Phone: [phone] Fax: [phone]  02/21/2023, 8:18 AM

## 2023-04-05 ENCOUNTER — Other Ambulatory Visit: Payer: Self-pay | Admitting: Medical Genetics

## 2023-04-05 DIAGNOSIS — Z006 Encounter for examination for normal comparison and control in clinical research program: Secondary | ICD-10-CM

## 2023-05-13 ENCOUNTER — Other Ambulatory Visit (HOSPITAL_COMMUNITY)
Admission: RE | Admit: 2023-05-13 | Discharge: 2023-05-13 | Disposition: A | Discharge status: Home / Self Care | Payer: Self-pay | Source: Ambulatory Visit | Attending: Oncology | Admitting: Oncology

## 2023-05-13 DIAGNOSIS — Z006 Encounter for examination for normal comparison and control in clinical research program: Secondary | ICD-10-CM | POA: Insufficient documentation

## 2023-05-23 LAB — HELIX MOLECULAR SCREEN: Genetic Analysis Overall Interpretation: NEGATIVE

## 2023-06-10 ENCOUNTER — Inpatient Hospital Stay: Payer: Medicare Other

## 2023-06-10 ENCOUNTER — Ambulatory Visit: Payer: Medicare Other | Admitting: Internal Medicine

## 2023-06-12 ENCOUNTER — Inpatient Hospital Stay: Payer: Medicare Other | Admitting: Internal Medicine

## 2023-06-13 ENCOUNTER — Inpatient Hospital Stay: Payer: Medicare Other | Attending: Internal Medicine

## 2023-06-13 ENCOUNTER — Ambulatory Visit (HOSPITAL_COMMUNITY)
Admission: RE | Admit: 2023-06-13 | Discharge: 2023-06-13 | Disposition: A | Discharge status: Home / Self Care | Payer: Medicare Other | Source: Ambulatory Visit | Attending: Internal Medicine | Admitting: Internal Medicine

## 2023-06-13 DIAGNOSIS — Z85118 Personal history of other malignant neoplasm of bronchus and lung: Secondary | ICD-10-CM | POA: Insufficient documentation

## 2023-06-13 DIAGNOSIS — R918 Other nonspecific abnormal finding of lung field: Secondary | ICD-10-CM | POA: Diagnosis present

## 2023-06-13 DIAGNOSIS — C3490 Malignant neoplasm of unspecified part of unspecified bronchus or lung: Secondary | ICD-10-CM | POA: Insufficient documentation

## 2023-06-13 LAB — CBC WITH DIFFERENTIAL (CANCER CENTER ONLY)
Abs Immature Granulocytes: 0.04 10*3/uL (ref 0.00–0.07)
Basophils Absolute: 0.1 10*3/uL (ref 0.0–0.1)
Basophils Relative: 1 %
Eosinophils Absolute: 0.3 10*3/uL (ref 0.0–0.5)
Eosinophils Relative: 3 %
HCT: 35.6 % — ABNORMAL LOW (ref 36.0–46.0)
Hemoglobin: 11.8 g/dL — ABNORMAL LOW (ref 12.0–15.0)
Immature Granulocytes: 0 %
Lymphocytes Relative: 19 %
Lymphs Abs: 1.9 10*3/uL (ref 0.7–4.0)
MCH: 26.5 pg (ref 26.0–34.0)
MCHC: 33.1 g/dL (ref 30.0–36.0)
MCV: 80 fL (ref 80.0–100.0)
Monocytes Absolute: 0.6 10*3/uL (ref 0.1–1.0)
Monocytes Relative: 6 %
Neutro Abs: 7.2 10*3/uL (ref 1.7–7.7)
Neutrophils Relative %: 71 %
Platelet Count: 276 10*3/uL (ref 150–400)
RBC: 4.45 MIL/uL (ref 3.87–5.11)
RDW: 12.8 % (ref 11.5–15.5)
WBC Count: 10 10*3/uL (ref 4.0–10.5)
nRBC: 0 % (ref 0.0–0.2)

## 2023-06-13 LAB — CMP (CANCER CENTER ONLY)
ALT: 13 U/L (ref 0–44)
AST: 15 U/L (ref 15–41)
Albumin: 3.9 g/dL (ref 3.5–5.0)
Alkaline Phosphatase: 63 U/L (ref 38–126)
Anion gap: 5 (ref 5–15)
BUN: 21 mg/dL (ref 8–23)
CO2: 31 mmol/L (ref 22–32)
Calcium: 9.2 mg/dL (ref 8.9–10.3)
Chloride: 106 mmol/L (ref 98–111)
Creatinine: 0.73 mg/dL (ref 0.44–1.00)
GFR, Estimated: >60 mL/min (ref >60)
Glucose, Bld: 142 mg/dL — ABNORMAL HIGH (ref 70–99)
Potassium: 4.4 mmol/L (ref 3.5–5.1)
Sodium: 142 mmol/L (ref 135–145)
Total Bilirubin: 0.4 mg/dL (ref <1.2)
Total Protein: 6.5 g/dL (ref 6.5–8.1)

## 2023-06-13 MED ORDER — IOHEXOL 300 MG/ML  SOLN
75.0000 mL | Freq: Once | INTRAMUSCULAR | Status: AC | PRN
  Administered 2023-06-13: 75 mL via INTRAVENOUS

## 2023-06-25 ENCOUNTER — Ambulatory Visit (INDEPENDENT_AMBULATORY_CARE_PROVIDER_SITE_OTHER): Payer: Medicare Other | Admitting: Nurse Practitioner

## 2023-06-25 ENCOUNTER — Other Ambulatory Visit: Payer: Self-pay | Admitting: *Deleted

## 2023-06-25 ENCOUNTER — Encounter: Payer: Self-pay | Admitting: Nurse Practitioner

## 2023-06-25 VITALS — BP 124/86 | HR 68 | Ht 67.0 in | Wt 198.6 lb

## 2023-06-25 DIAGNOSIS — E559 Vitamin D deficiency, unspecified: Secondary | ICD-10-CM | POA: Diagnosis not present

## 2023-06-25 DIAGNOSIS — I10 Essential (primary) hypertension: Secondary | ICD-10-CM

## 2023-06-25 DIAGNOSIS — E782 Mixed hyperlipidemia: Secondary | ICD-10-CM

## 2023-06-25 DIAGNOSIS — Z7984 Long term (current) use of oral hypoglycemic drugs: Secondary | ICD-10-CM

## 2023-06-25 DIAGNOSIS — Z794 Long term (current) use of insulin: Secondary | ICD-10-CM | POA: Diagnosis not present

## 2023-06-25 DIAGNOSIS — E119 Type 2 diabetes mellitus without complications: Secondary | ICD-10-CM | POA: Diagnosis not present

## 2023-06-25 DIAGNOSIS — Z7985 Long-term (current) use of injectable non-insulin antidiabetic drugs: Secondary | ICD-10-CM

## 2023-06-25 LAB — HEMOGLOBIN A1C: Hemoglobin A1C: 6.6

## 2023-06-25 MED ORDER — METFORMIN HCL ER 500 MG PO TB24: 500.0000 mg | ORAL_TABLET | Freq: Every day | ORAL | 3 refills | Status: DC

## 2023-06-25 MED ORDER — TIRZEPATIDE 15 MG/0.5ML ~~LOC~~ SOAJ: 15.0000 mg | SUBCUTANEOUS | 3 refills | Status: DC

## 2023-06-25 MED ORDER — LANTUS SOLOSTAR 100 UNIT/ML ~~LOC~~ SOPN: 15.0000 [IU] | PEN_INJECTOR | Freq: Every day | SUBCUTANEOUS | 3 refills | Status: DC

## 2023-06-25 NOTE — Progress Notes (Signed)
Endocrinology Follow Up Note       06/25/2023, 8:25 AM   Subjective:    Patient ID: [name], female    DOB: [birth year].  [name] is being seen in follow up after being seen in consultation for management of currently uncontrolled symptomatic diabetes requested by  Darrow Bussing, MD.   Past Medical History:  Diagnosis Date   Adenocarcinoma of right lung, stage 1 (HCC) 11/29/2016   Cancer (HCC)    Chronic pain    Depression    takes Lexapro daily   Diabetes mellitus    takes Metformin daily and has an insulin pump.Fasting blood sugar runs250   GERD (gastroesophageal reflux disease)    takes Pantoprazole daily   History of colon polyps    benign   Hyperlipidemia    takes Atorvastatin daily   Hypertension    takes Amlodipine and Lisinopril daily   Insomnia    takes Trazodone nightly   Nocturia    Thyroid disease    Urinary frequency    Urinary urgency     Past Surgical History:  Procedure Laterality Date   ABDOMINAL HYSTERECTOMY     ABDOMINAL SURGERY     BIOPSY  05/28/2019   Procedure: BIOPSY;  Surgeon: West Bali, MD;  [facility]: [facility];  Service: Endoscopy;;  random colon/gastric/duodenum   BIOPSY  01/11/2023   Procedure: BIOPSY;  Surgeon: Lanelle Bal, DO;  [facility]: [facility];  Service: Endoscopy;;   COLONOSCOPY  2005 MAC   TICs, IH   COLONOSCOPY N/A 03/08/2014   Procedure: COLONOSCOPY;  Surgeon: West Bali, MD;  [facility]: [facility];  Service: Endoscopy;  Laterality: N/A;  1015   COLONOSCOPY WITH PROPOFOL N/A 05/28/2019   Procedure: COLONOSCOPY WITH PROPOFOL;  Surgeon: West Bali, MD;  [facility]: [facility];  Service: Endoscopy;  Laterality: N/A;  9:15am   CYSTOSTOMY  11/22/2011   Procedure: CYSTOSTOMY SUPRAPUBIC;  Surgeon: Ky Barban, MD;  Location: [facility];  Service: Urology;  Laterality: N/A;   ESOPHAGOGASTRODUODENOSCOPY N/A 03/08/2014    Procedure: ESOPHAGOGASTRODUODENOSCOPY (EGD);  Surgeon: West Bali, MD;  [facility];  Service: Endoscopy;  Laterality: N/A;   ESOPHAGOGASTRODUODENOSCOPY     ESOPHAGOGASTRODUODENOSCOPY (EGD) WITH PROPOFOL N/A 05/28/2019   Procedure: ESOPHAGOGASTRODUODENOSCOPY (EGD) WITH PROPOFOL;  Surgeon: West Bali, MD;  [facility];  Service: Endoscopy;  Laterality: N/A;   ESOPHAGOGASTRODUODENOSCOPY (EGD) WITH PROPOFOL N/A 01/11/2023   Procedure: ESOPHAGOGASTRODUODENOSCOPY (EGD) WITH PROPOFOL;  Surgeon: Lanelle Bal, DO;  [facility];  Service: Endoscopy;  Laterality: N/A;  9:45 am, asa 3   GIVENS CAPSULE STUDY N/A 06/15/2019   Procedure: GIVENS CAPSULE STUDY;  Surgeon: West Bali, MD;  [facility];  Service: Endoscopy;  Laterality: N/A;  7:30am   HALLUX VALGUS CORRECTION     bilateral foot surgery for bone repairs-multiple   LOBECTOMY Right 10/15/2016   Procedure: RIGHT UPPER LOBECTOMY;  Surgeon: Loreli Slot, MD;  Location: [facility];  Service: Thoracic;  Laterality: Right;   LYMPH NODE DISSECTION Right 10/15/2016   Procedure: LYMPH NODE DISSECTION;  Surgeon: Loreli Slot, MD;  Location:  [facility];  Service: Thoracic;  Laterality: Right;   POLYPECTOMY  05/28/2019   Procedure: POLYPECTOMY;  Surgeon: West Bali, MD;  [facility];  Service: Endoscopy;;   SAVORY DILATION N/A 05/28/2019   Procedure: SAVORY DILATION;  Surgeon: West Bali, MD;  [facility];  Service: Endoscopy;  Laterality: N/A;  15/16/17   VAGINA RECONSTRUCTION SURGERY     tvt 2006- [place]   VIDEO ASSISTED THORACOSCOPY (VATS)/WEDGE RESECTION Right 10/15/2016   Procedure: RIGHT VIDEO ASSISTED THORACOSCOPY (VATS)/WEDGE RESECTION;  Surgeon: Loreli Slot, MD;  Location: [facility];  Service: Thoracic;  Laterality: Right;    Social History   Socioeconomic History   Marital status: Single    Spouse name: Not on file   Number of  children: Not on file   Years of education: Not on file   Highest education level: Not on file  Occupational History   Not on file  Tobacco Use   Smoking status: Former    Current packs/day: 1.00    Average packs/day: 1 pack/day for 32.0 years (32.0 ttl pk-yrs)    Types: Cigarettes   Smokeless tobacco: Never   Tobacco comments:    quit smoking 20 yrs ago  Vaping Use   Vaping status: Never Used  Substance and Sexual Activity   Alcohol use: No    Comment: rarely   Drug use: No   Sexual activity: Not on file  Other Topics Concern   Not on file  Social History Narrative   Not on file   Social Determinants of Health   Financial Resource Strain: Not on file  Food Insecurity: Not on file  Transportation Needs: Not on file  Physical Activity: Not on file  Stress: Not on file  Social Connections: Not on file    Family History  Problem Relation Age of Onset   Asthma Other    Diabetes Other    Arthritis Other    Cancer Mother    Cancer Brother    Anesthesia problems Neg Hx    Hypotension Neg Hx    Malignant hyperthermia Neg Hx    Pseudochol deficiency Neg Hx    Colon polyps Neg Hx    Colon cancer Neg Hx    Celiac disease Neg Hx    Pancreatic cancer Neg Hx    Stomach cancer Neg Hx    Ulcerative colitis Neg Hx    Crohn's disease Neg Hx     Outpatient Encounter Medications as of 06/25/2023  Medication Sig   ACCU-CHEK AVIVA PLUS test strip USE TO MONITOR GLUCOSE LEVELS TWICE A DAY E11.9   ACCU-CHEK SOFTCLIX LANCETS lancets Use to monitor glucose levels BID; E11.9 (Patient taking differently: 1 each by Other route 2 (two) times daily. E11.9)   acetaminophen (TYLENOL) 500 MG tablet Take 1 tablet (500 mg total) by mouth every 6 (six) hours as needed. (Patient taking differently: Take 500 mg by mouth every 6 (six) hours as needed (for pain).)   atorvastatin (LIPITOR) 20 MG tablet Take 20 mg by mouth daily.   Blood Glucose Calibration (GLUCOSE CONTROL) SOLN 1 Bottle by In  Vitro route as needed. Use to calibrate Accu-Chek Aviva Plus device prn; please provide control solution compatible with Accu-Chek Aviva Plus device. (Patient taking differently: 1 each by Other route as needed (Use to calibrate glucometer). E11.9)   Blood Glucose Monitoring Suppl (ACCU-CHEK AVIVA PLUS) w/Device KIT 1 each by Does not apply route 2 (two) times daily. Use to monitor  glucose levels BID; E11.9 (Patient taking differently: 1 each by Does not apply route 2 (two) times daily. E11.9)   chlorthalidone (HYGROTON) 25 MG tablet Take 12.5 mg by mouth daily.    clobetasol cream (TEMOVATE) 0.05 % Apply 1 Application topically 2 (two) times daily as needed (itching).   diclofenac Sodium (VOLTAREN ARTHRITIS PAIN) 1 % GEL Apply 4 g topically 4 (four) times daily. (Patient taking differently: Apply 4 g topically 3 (three) times daily as needed (joint pain).)   escitalopram (LEXAPRO) 10 MG tablet Take 1 tablet (10 mg total) by mouth every morning.   ibuprofen (ADVIL) 800 MG tablet Take 800 mg by mouth every 8 (eight) hours as needed for moderate pain.   Insulin Pen Needle (B-D ULTRAFINE III SHORT PEN) 31G X 8 MM MISC USE AS DIRECTED   lisinopril (ZESTRIL) 10 MG tablet Take 10 mg by mouth daily.   pantoprazole (PROTONIX) 40 MG tablet Take 1 tablet (40 mg total) by mouth 2 (two) times daily.   traZODone (DESYREL) 100 MG tablet Take 2 tablets (200 mg total) by mouth at bedtime.   triamcinolone cream (KENALOG) 0.1 % Apply 1 application topically 2 (two) times daily. (Patient taking differently: Apply 1 application  topically 2 (two) times daily as needed (irritation).)   [DISCONTINUED] insulin glargine (LANTUS SOLOSTAR) 100 UNIT/ML Solostar Pen Inject 25 Units into the skin at bedtime.   [DISCONTINUED] metFORMIN (GLUCOPHAGE-XR) 500 MG 24 hr tablet Take 1 tablet (500 mg total) by mouth daily with breakfast.   [DISCONTINUED] tirzepatide (MOUNJARO) 15 MG/0.5ML Pen Inject 15 mg into the skin once a week.    insulin glargine (LANTUS SOLOSTAR) 100 UNIT/ML Solostar Pen Inject 15 Units into the skin at bedtime.   metFORMIN (GLUCOPHAGE-XR) 500 MG 24 hr tablet Take 1 tablet (500 mg total) by mouth daily with breakfast.   tirzepatide (MOUNJARO) 15 MG/0.5ML Pen Inject 15 mg into the skin once a week.   No facility-administered encounter medications on file as of 06/25/2023.    ALLERGIES: Allergies  Allergen Reactions   Synthroid [Levothyroxine Sodium] Anaphylaxis   Hydrochlorothiazide     cramps   Iodine Rash    I get a skin rash sometimes if iodine applied to my skin.      VACCINATION STATUS:  There is no immunization history on file for this patient.  Diabetes She presents for her follow-up diabetic visit. She has type 2 diabetes mellitus. Onset time: diagnosed at approx age of 44. Her disease course has been stable. There are no hypoglycemic associated symptoms. There are no diabetic associated symptoms. There are no hypoglycemic complications. There are no diabetic complications. Risk factors for coronary artery disease include diabetes mellitus, obesity, hypertension and post-menopausal. Current diabetic treatment includes oral agent (monotherapy) and insulin injections (and Mounjaro). She is compliant with treatment all of the time. Her weight is decreasing steadily. She is following a generally healthy diet. Meal planning includes avoidance of concentrated sweets. She has not had a previous visit with a dietitian. She participates in exercise daily. Her home blood glucose trend is decreasing steadily. Her overall blood glucose range is 130-140 mg/dl. (She presents today with her CGM showing tight glycemic profile overall.  Her POCT A1c today is 6.6%, unchanged from last visit.  Analysis of her CGM shows TIR 86%, TAR 10%, TBR 4% with a GMI of 6.4%.  She does have some nocturnal hypoglycemia noted.  She continues to eat right and exercise optimally.) An ACE inhibitor/angiotensin II receptor blocker  is  being taken. She sees a podiatrist.Eye exam is current.     Review of systems  Constitutional: + steadily decreasing body weight,  current Body mass index is 31.11 kg/m. , no fatigue, no subjective hyperthermia, no subjective hypothermia Eyes: no blurry vision, no xerophthalmia ENT: no sore throat, no nodules palpated in throat, no dysphagia/odynophagia, no hoarseness Cardiovascular: no chest pain, no shortness of breath, no palpitations, no leg swelling Respiratory: no cough, no shortness of breath Gastrointestinal: no nausea/vomiting/diarrhea Skin: no rashes, no hyperemia Neurological: no tremors, no numbness, no tingling, no dizziness Psychiatric: no depression, no anxiety  Objective:     BP 124/86 (BP Location: Right Arm, Patient Position: Sitting, Cuff Size: Large)   Pulse 68   Ht 5\' 7"  (1.702 m)   Wt 198 lb 9.6 oz (90.1 kg)   BMI 31.11 kg/m   Wt Readings from Last 3 Encounters:  06/25/23 198 lb 9.6 oz (90.1 kg)  02/21/23 206 lb 12.8 oz (93.8 kg)  01/09/23 212 lb 15.4 oz (96.6 kg)     BP Readings from Last 3 Encounters:  06/25/23 124/86  02/21/23 124/70  01/11/23 (!) 120/47      Physical Exam- Limited  Constitutional:  Body mass index is 31.11 kg/m. , not in acute distress, normal state of mind Eyes:  EOMI, no exophthalmos Musculoskeletal: no gross deformities, strength intact in all four extremities, no gross restriction of joint movements Skin:  no rashes, no hyperemia Neurological: no tremor with outstretched hands  Diabetic Foot Exam - Simple   No data filed     CMP ( most recent) CMP     Component Value Date/Time   NA 142 06/13/2023 1544   NA 142 10/29/2022 1158   NA 141 05/29/2017 0921   K 4.4 06/13/2023 1544   K 4.8 05/29/2017 0921   CL 106 06/13/2023 1544   CO2 31 06/13/2023 1544   CO2 28 05/29/2017 0921   GLUCOSE 142 (H) 06/13/2023 1544   GLUCOSE 184 (H) 05/29/2017 0921   BUN 21 06/13/2023 1544   BUN 21 10/29/2022 1158   BUN 13.5  05/29/2017 0921   CREATININE 0.73 06/13/2023 1544   CREATININE 0.8 05/29/2017 0921   CALCIUM 9.2 06/13/2023 1544   CALCIUM 9.5 05/29/2017 0921   PROT 6.5 06/13/2023 1544   PROT 6.2 10/29/2022 1158   PROT 7.1 05/29/2017 0921   ALBUMIN 3.9 06/13/2023 1544   ALBUMIN 4.3 10/29/2022 1158   ALBUMIN 80 mg/L 05/17/2022 0922   ALBUMIN 3.6 05/29/2017 0921   AST 15 06/13/2023 1544   AST 17 05/29/2017 0921   ALT 13 06/13/2023 1544   ALT 20 05/29/2017 0921   ALKPHOS 63 06/13/2023 1544   ALKPHOS 104 05/29/2017 0921   BILITOT 0.4 06/13/2023 1544   BILITOT 0.40 05/29/2017 0921   GFRNONAA >60 06/13/2023 1544   GFRAA >60 12/08/2019 0849     Diabetic Labs (most recent): Lab Results  Component Value Date   HGBA1C 6.6 06/25/2023   HGBA1C 6.6 (A) 02/21/2023   HGBA1C 6.6 (A) 11/21/2022     Lipid Panel ( most recent) Lipid Panel     Component Value Date/Time   CHOL 146 10/29/2022 1158   TRIG 113 10/29/2022 1158   HDL 62 10/29/2022 1158   CHOLHDL 2.4 10/29/2022 1158   LDLCALC 64 10/29/2022 1158   LABVLDL 20 10/29/2022 1158      Lab Results  Component Value Date   TSH 0.871 10/29/2022   TSH 2.93 07/11/2016   FREET4 1.22  10/29/2022           Assessment & Plan:   1) Type 2 diabetes mellitus without complication, with long-term current use of insulin (HCC)  She presents today with her CGM showing tight glycemic profile overall.  Her POCT A1c today is 6.6%, unchanged from last visit.  Analysis of her CGM shows TIR 86%, TAR 10%, TBR 4% with a GMI of 6.4%.  She does have some nocturnal hypoglycemia noted.  She continues to eat right and exercise optimally.  - Rufina Falco Boxberger has currently uncontrolled symptomatic type 2 DM since 67 years of age.   -Recent labs reviewed.  - I had a long discussion with her about the progressive nature of diabetes and the pathology behind its complications. -her diabetes is not currently complicated but she remains at a high risk for more acute and  chronic complications which include CAD, CVA, CKD, retinopathy, and neuropathy. These are all discussed in detail with her.  The following Lifestyle Medicine recommendations according to American College of Lifestyle Medicine [facility]) were discussed and offered to patient and she agrees to start the journey:  A. Whole Foods, Plant-based plate comprising of fruits and vegetables, plant-based proteins, whole-grain carbohydrates was discussed in detail with the patient.   A list for source of those nutrients were also provided to the patient.  Patient will use only water or unsweetened tea for hydration. B.  The need to stay away from risky substances including alcohol, smoking; obtaining 7 to 9 hours of restorative sleep, at least 150 minutes of moderate intensity exercise weekly, the importance of healthy social connections,  and stress reduction techniques were discussed. C.  A full color page of  Calorie density of various food groups per pound showing examples of each food groups was provided to the patient.  - Nutritional counseling repeated at each appointment due to patients tendency to fall back in to old habits.  - The patient admits there is a room for improvement in their diet and drink choices. -  Suggestion is made for the patient to avoid simple carbohydrates from their diet including Cakes, Sweet Desserts / Pastries, Ice Cream, Soda (diet and regular), Sweet Tea, Candies, Chips, Cookies, Sweet Pastries, Store Bought Juices, Alcohol in Excess of 1-2 drinks a day, Artificial Sweeteners, Coffee Creamer, and "Sugar-free" Products. This will help patient to have stable blood glucose profile and potentially avoid unintended weight gain.   - I encouraged the patient to switch to unprocessed or minimally processed complex starch and increased protein intake (animal or plant source), fruits, and vegetables.   - Patient is advised to stick to a routine mealtimes to eat 3 meals a day and avoid  unnecessary snacks (to snack only to correct hypoglycemia).  - I have approached her with the following individualized plan to manage her diabetes and patient agrees:   -She is advised to lower her Lantus to 15 units SQ nightly.  She can continue her Mounjaro 15 mg SQ weekly and Metformin 500 mg ER daily with breakfast for now.   -she is encouraged to start monitoring glucose 4 times daily (using her CGM), before meals and before bed, and to call the clinic if she has readings less than 70 or above 300 for 3 tests in a row.  - she is warned not to take insulin without proper monitoring per orders. - Adjustment parameters are given to her for hypo and hyperglycemia in writing.  - her Parlodel and Marcelline Deist was discontinued previously,  due to side effect profile and to deescalate her treatment plan.   - Specific targets for  A1c; LDL, HDL, and Triglycerides were discussed with the patient.  2) Blood Pressure /Hypertension:  her blood pressure is controlled to target.   she is advised to continue her current medications including Lisinopril 10 mg p.o. daily with breakfast, Chlorthalidone 25 mg po daily.  3) Lipids/Hyperlipidemia:    Her recent lipid panel from 10/29/22 shows controlled LDL of 64.  she is advised to continue Lipitor 20 mg daily at bedtime.  Side effects and precautions discussed with her.   4)  Weight/Diet:  her Body mass index is 31.11 kg/m.  -  clearly complicating her diabetes care.   she is a candidate for weight loss. I discussed with her the fact that loss of 5 - 10% of her  current body weight will have the most impact on her diabetes management.  Exercise, and detailed carbohydrates information provided  -  detailed on discharge instructions.  5)Vitamin D deficiency Her recent vitamin D level on 10/29/22 was 19.5.  I discussed and initiated Ergocalciferol 50000 units po weekly x 12 weeks, once finished she can switch over to OTC Vitamin D3 5000 units daily as maintenance  dose.    6) Chronic Care/Health Maintenance: -she is on ACEI/ARB and Statin medications and is encouraged to initiate and continue to follow up with Ophthalmology, Dentist, Podiatrist at least yearly or according to recommendations, and advised to stay away from smoking. I have recommended yearly flu vaccine and pneumonia vaccine at least every 5 years; moderate intensity exercise for up to 150 minutes weekly; and sleep for at least 7 hours a day.  - she is advised to maintain close follow up with Darrow Bussing, MD for primary care needs, as well as her other providers for optimal and coordinated care.     I spent  36  minutes in the care of the patient today including review of labs from CMP, Lipids, Thyroid Function, Hematology (current and previous including abstractions from other facilities); face-to-face time discussing  her blood glucose readings/logs, discussing hypoglycemia and hyperglycemia episodes and symptoms, medications doses, her options of short and long term treatment based on the latest standards of care / guidelines;  discussion about incorporating lifestyle medicine;  and documenting the encounter. Risk reduction counseling performed per USPSTF guidelines to reduce obesity and cardiovascular risk factors.     Please refer to Patient Instructions for Blood Glucose Monitoring and Insulin/Medications Dosing Guide"  in media tab for additional information. Please  also refer to " Patient Self Inventory" in the Media  tab for reviewed elements of pertinent patient history.  Trudee Grip participated in the discussions, expressed understanding, and voiced agreement with the above plans.  All questions were answered to her satisfaction. she is encouraged to contact clinic should she have any questions or concerns prior to her return visit.     Follow up plan: - Return in about 4 months (around 10/23/2023) for Diabetes F/U with A1c in office, Previsit labs, Bring meter and  logs.  Ronny Bacon, [facility] [address] [place], [place] [address] Phone: [phone] Fax: [phone]  06/25/2023, 8:25 AM

## 2023-06-30 DIAGNOSIS — C801 Malignant (primary) neoplasm, unspecified: Secondary | ICD-10-CM

## 2023-06-30 HISTORY — DX: Malignant (primary) neoplasm, unspecified: C80.1

## 2023-07-03 ENCOUNTER — Inpatient Hospital Stay: Payer: Medicare Other | Attending: Internal Medicine | Admitting: Internal Medicine

## 2023-07-03 VITALS — BP 135/70 | HR 99 | Temp 98.0°F | Resp 15 | Ht 67.0 in | Wt 197.0 lb

## 2023-07-03 DIAGNOSIS — C3411 Malignant neoplasm of upper lobe, right bronchus or lung: Secondary | ICD-10-CM | POA: Insufficient documentation

## 2023-07-03 DIAGNOSIS — C7971 Secondary malignant neoplasm of right adrenal gland: Secondary | ICD-10-CM | POA: Diagnosis not present

## 2023-07-03 DIAGNOSIS — E119 Type 2 diabetes mellitus without complications: Secondary | ICD-10-CM | POA: Insufficient documentation

## 2023-07-03 DIAGNOSIS — K2270 Barrett's esophagus without dysplasia: Secondary | ICD-10-CM | POA: Diagnosis not present

## 2023-07-03 DIAGNOSIS — G939 Disorder of brain, unspecified: Secondary | ICD-10-CM | POA: Insufficient documentation

## 2023-07-03 DIAGNOSIS — C3490 Malignant neoplasm of unspecified part of unspecified bronchus or lung: Secondary | ICD-10-CM | POA: Diagnosis not present

## 2023-07-03 DIAGNOSIS — Z902 Acquired absence of lung [part of]: Secondary | ICD-10-CM | POA: Insufficient documentation

## 2023-07-03 DIAGNOSIS — Z85118 Personal history of other malignant neoplasm of bronchus and lung: Secondary | ICD-10-CM | POA: Diagnosis present

## 2023-07-03 DIAGNOSIS — C778 Secondary and unspecified malignant neoplasm of lymph nodes of multiple regions: Secondary | ICD-10-CM | POA: Insufficient documentation

## 2023-07-03 DIAGNOSIS — K219 Gastro-esophageal reflux disease without esophagitis: Secondary | ICD-10-CM | POA: Insufficient documentation

## 2023-07-03 DIAGNOSIS — C7972 Secondary malignant neoplasm of left adrenal gland: Secondary | ICD-10-CM | POA: Diagnosis not present

## 2023-07-03 DIAGNOSIS — C7951 Secondary malignant neoplasm of bone: Secondary | ICD-10-CM | POA: Diagnosis not present

## 2023-07-03 DIAGNOSIS — R591 Generalized enlarged lymph nodes: Secondary | ICD-10-CM | POA: Diagnosis not present

## 2023-07-03 NOTE — Progress Notes (Signed)
[name] [phone]   Fax:([phone]  OFFICE PROGRESS NOTE  Koirala, Dibas, MD [address] [place] [address]  DIAGNOSIS: Stage IA (T1a, N0, M0) non-small cell lung cancer, adenocarcinoma presented with right upper lobe pulmonary nodule   PRIOR THERAPY: S/P right VATS with right upper lobectomy and mediastinal lymph node dissection under the care of Dr. Dorris Fetch on October 15, 2016.  CURRENT THERAPY: Observation.  INTERVAL HISTORY: [name] 67 y.o. female returns to the clinic today for annual follow-up visit. Discussed the use of AI scribe software for clinical note transcription with the patient, who gave verbal consent to proceed.  History of Present Illness   The patient, a 66 year old with a history of stage 1A non-small cell lung cancer adenocarcinoma, underwent a right upper lobectomy in 2018. She has been under surveillance with annual scans since then. Over the past year, the patient has not noticed any significant changes in her health. She has embarked on a self-imposed diet and exercise regimen, resulting in a 70-pound weight loss, which was intentional. She reports feeling good overall.  The patient was recently diagnosed with Barrett's esophagus following an endoscopy. She is currently on twice-daily Protonix for reflux management. She denies any new symptoms, including nausea, vomiting, diarrhea, shortness of breath, cough, chest pain, headaches, or changes in vision.  However, her recent scan showed abnormal lymph nodes throughout the chest, including the right hilum, mediastinum, supraclavicular, bilateral axillary, and upper abdomen. There was also a noted right adrenal nodule, raising concerns for possible adrenal metastasis. These findings are new compared to previous scans, raising concerns for possible recurrence or a new type of cancer.       MEDICAL HISTORY: Past Medical History:  Diagnosis  Date   Adenocarcinoma of right lung, stage 1 (HCC) 11/29/2016   Cancer (HCC)    Chronic pain    Depression    takes Lexapro daily   Diabetes mellitus    takes Metformin daily and has an insulin pump.Fasting blood sugar runs250   GERD (gastroesophageal reflux disease)    takes Pantoprazole daily   History of colon polyps    benign   Hyperlipidemia    takes Atorvastatin daily   Hypertension    takes Amlodipine and Lisinopril daily   Insomnia    takes Trazodone nightly   Nocturia    Thyroid disease    Urinary frequency    Urinary urgency     ALLERGIES:  is allergic to synthroid [levothyroxine sodium], hydrochlorothiazide, and iodine.  MEDICATIONS:  Current Outpatient Medications  Medication Sig Dispense Refill   ACCU-CHEK AVIVA PLUS test strip USE TO MONITOR GLUCOSE LEVELS TWICE A DAY E11.9 100 strip 12   ACCU-CHEK SOFTCLIX LANCETS lancets Use to monitor glucose levels BID; E11.9 (Patient taking differently: 1 each by Other route 2 (two) times daily. E11.9) 100 each 12   acetaminophen (TYLENOL) 500 MG tablet Take 1 tablet (500 mg total) by mouth every 6 (six) hours as needed. (Patient taking differently: Take 500 mg by mouth every 6 (six) hours as needed (for pain).) 30 tablet 0   atorvastatin (LIPITOR) 20 MG tablet Take 20 mg by mouth daily.  11   Blood Glucose Calibration (GLUCOSE CONTROL) SOLN 1 Bottle by In Vitro route as needed. Use to calibrate Accu-Chek Aviva Plus device prn; please provide control solution compatible with Accu-Chek Aviva Plus device. (Patient taking differently: 1 each by Other route as needed (Use to calibrate  glucometer). E11.9) 1 each 1   Blood Glucose Monitoring Suppl (ACCU-CHEK AVIVA PLUS) w/Device KIT 1 each by Does not apply route 2 (two) times daily. Use to monitor glucose levels BID; E11.9 (Patient taking differently: 1 each by Does not apply route 2 (two) times daily. E11.9) 1 kit 0   chlorthalidone (HYGROTON) 25 MG tablet Take 12.5 mg by mouth daily.       clobetasol cream (TEMOVATE) 0.05 % Apply 1 Application topically 2 (two) times daily as needed (itching).     diclofenac Sodium (VOLTAREN ARTHRITIS PAIN) 1 % GEL Apply 4 g topically 4 (four) times daily. (Patient taking differently: Apply 4 g topically 3 (three) times daily as needed (joint pain).) 150 g 0   escitalopram (LEXAPRO) 10 MG tablet Take 1 tablet (10 mg total) by mouth every morning. 90 tablet 3   ibuprofen (ADVIL) 800 MG tablet Take 800 mg by mouth every 8 (eight) hours as needed for moderate pain.     insulin glargine (LANTUS SOLOSTAR) 100 UNIT/ML Solostar Pen Inject 15 Units into the skin at bedtime. 15 mL 3   Insulin Pen Needle (B-D ULTRAFINE III SHORT PEN) 31G X 8 MM MISC USE AS DIRECTED 100 each 5   lisinopril (ZESTRIL) 10 MG tablet Take 10 mg by mouth daily.     metFORMIN (GLUCOPHAGE-XR) 500 MG 24 hr tablet Take 1 tablet (500 mg total) by mouth daily with breakfast. 90 tablet 3   pantoprazole (PROTONIX) 40 MG tablet Take 1 tablet (40 mg total) by mouth 2 (two) times daily. 180 tablet 3   tirzepatide (MOUNJARO) 15 MG/0.5ML Pen Inject 15 mg into the skin once a week. 6 mL 3   traZODone (DESYREL) 100 MG tablet Take 2 tablets (200 mg total) by mouth at bedtime. 180 tablet 3   triamcinolone cream (KENALOG) 0.1 % Apply 1 application topically 2 (two) times daily. (Patient taking differently: Apply 1 application  topically 2 (two) times daily as needed (irritation).) 30 g 0   No current facility-administered medications for this visit.    SURGICAL HISTORY:  Past Surgical History:  Procedure Laterality Date   ABDOMINAL HYSTERECTOMY     ABDOMINAL SURGERY     BIOPSY  05/28/2019   Procedure: BIOPSY;  Surgeon: West Bali, MD;  [facility]: [facility];  Service: Endoscopy;;  random colon/gastric/duodenum   BIOPSY  01/11/2023   Procedure: BIOPSY;  Surgeon: Lanelle Bal, DO;  [facility]: [facility];  Service: Endoscopy;;   COLONOSCOPY  2005 MAC   TICs, IH   COLONOSCOPY  N/A 03/08/2014   Procedure: COLONOSCOPY;  Surgeon: West Bali, MD;  [facility]: [facility];  Service: Endoscopy;  Laterality: N/A;  1015   COLONOSCOPY WITH PROPOFOL N/A 05/28/2019   Procedure: COLONOSCOPY WITH PROPOFOL;  Surgeon: West Bali, MD;  [facility]: [facility];  Service: Endoscopy;  Laterality: N/A;  9:15am   CYSTOSTOMY  11/22/2011   Procedure: CYSTOSTOMY SUPRAPUBIC;  Surgeon: Ky Barban, MD;  Location: [facility];  Service: Urology;  Laterality: N/A;   ESOPHAGOGASTRODUODENOSCOPY N/A 03/08/2014   Procedure: ESOPHAGOGASTRODUODENOSCOPY (EGD);  Surgeon: West Bali, MD;  [facility];  Service: Endoscopy;  Laterality: N/A;   ESOPHAGOGASTRODUODENOSCOPY     ESOPHAGOGASTRODUODENOSCOPY (EGD) WITH PROPOFOL N/A 05/28/2019   Procedure: ESOPHAGOGASTRODUODENOSCOPY (EGD) WITH PROPOFOL;  Surgeon: West Bali, MD;  [facility];  Service: Endoscopy;  Laterality: N/A;   ESOPHAGOGASTRODUODENOSCOPY (EGD) WITH PROPOFOL N/A 01/11/2023   Procedure: ESOPHAGOGASTRODUODENOSCOPY (EGD) WITH  PROPOFOL;  Surgeon: Lanelle Bal, DO;  [facility];  Service: Endoscopy;  Laterality: N/A;  9:45 am, asa 3   GIVENS CAPSULE STUDY N/A 06/15/2019   Procedure: GIVENS CAPSULE STUDY;  Surgeon: West Bali, MD;  [facility];  Service: Endoscopy;  Laterality: N/A;  7:30am   HALLUX VALGUS CORRECTION     bilateral foot surgery for bone repairs-multiple   LOBECTOMY Right 10/15/2016   Procedure: RIGHT UPPER LOBECTOMY;  Surgeon: Loreli Slot, MD;  [facility];  Service: Thoracic;  Laterality: Right;   LYMPH NODE DISSECTION Right 10/15/2016   Procedure: LYMPH NODE DISSECTION;  Surgeon: Loreli Slot, MD;  Location: [facility];  Service: Thoracic;  Laterality: Right;   POLYPECTOMY  05/28/2019   Procedure: POLYPECTOMY;  Surgeon: West Bali, MD;  [facility];  Service: Endoscopy;;   SAVORY DILATION N/A 05/28/2019   Procedure: SAVORY  DILATION;  Surgeon: West Bali, MD;  [facility];  Service: Endoscopy;  Laterality: N/A;  15/16/17   VAGINA RECONSTRUCTION SURGERY     tvt 2006- [place]   VIDEO ASSISTED THORACOSCOPY (VATS)/WEDGE RESECTION Right 10/15/2016   Procedure: RIGHT VIDEO ASSISTED THORACOSCOPY (VATS)/WEDGE RESECTION;  Surgeon: Loreli Slot, MD;  Location: [facility];  Service: Thoracic;  Laterality: Right;    REVIEW OF SYSTEMS:  Constitutional: positive for weight loss Eyes: negative Ears, nose, mouth, throat, and face: negative Respiratory: negative Cardiovascular: negative Gastrointestinal: negative Genitourinary:negative Integument/breast: negative Hematologic/lymphatic: negative Musculoskeletal:negative Neurological: negative Behavioral/Psych: negative Endocrine: negative Allergic/Immunologic: negative   PHYSICAL EXAMINATION: General appearance: alert, cooperative, and no distress Head: Normocephalic, without obvious abnormality, atraumatic Neck: no adenopathy, no JVD, supple, symmetrical, trachea midline, and thyroid not enlarged, symmetric, no tenderness/mass/nodules Lymph nodes: Cervical, supraclavicular, and axillary nodes normal. Resp: clear to auscultation bilaterally Back: symmetric, no curvature. ROM normal. No CVA tenderness. Cardio: regular rate and rhythm, S1, S2 normal, no murmur, click, rub or gallop GI: soft, non-tender; bowel sounds normal; no masses,  no organomegaly Extremities: extremities normal, atraumatic, no cyanosis or edema Neurologic: Alert and oriented X 3, normal strength and tone. Normal symmetric reflexes. Normal coordination and gait  ECOG PERFORMANCE STATUS: 0 - Asymptomatic  Blood pressure 135/70, pulse 99, temperature 98 F (36.7 C), temperature source Temporal, resp. rate 15, height 5\' 7"  (1.702 m), weight 197 lb (89.4 kg), SpO2 96%.  LABORATORY DATA: Lab Results  Component Value Date   WBC 10.0 06/13/2023   HGB 11.8 (L) 06/13/2023    HCT 35.6 (L) 06/13/2023   MCV 80.0 06/13/2023   PLT 276 06/13/2023      Chemistry      Component Value Date/Time   NA 142 06/13/2023 1544   NA 142 10/29/2022 1158   NA 141 05/29/2017 0921   K 4.4 06/13/2023 1544   K 4.8 05/29/2017 0921   CL 106 06/13/2023 1544   CO2 31 06/13/2023 1544   CO2 28 05/29/2017 0921   BUN 21 06/13/2023 1544   BUN 21 10/29/2022 1158   BUN 13.5 05/29/2017 0921   CREATININE 0.73 06/13/2023 1544   CREATININE 0.8 05/29/2017 0921      Component Value Date/Time   CALCIUM 9.2 06/13/2023 1544   CALCIUM 9.5 05/29/2017 0921   ALKPHOS 63 06/13/2023 1544   ALKPHOS 104 05/29/2017 0921   AST 15 06/13/2023 1544   AST 17 05/29/2017 0921   ALT 13 06/13/2023 1544   ALT 20 05/29/2017 0921   BILITOT 0.4 06/13/2023 1544  BILITOT 0.40 05/29/2017 5176       RADIOGRAPHIC STUDIES: CT Chest W Contrast  Result Date: 06/28/2023 CLINICAL DATA:  Staging non-small-cell lung cancer. Prior right upper lobectomy. Lymph node dissection. * Tracking Code: BO * EXAM: CT CHEST WITH CONTRAST TECHNIQUE: Multidetector CT imaging of the chest was performed during intravenous contrast administration. RADIATION DOSE REDUCTION: This exam was performed according to the departmental dose-optimization program which includes automated exposure control, adjustment of the mA and/or kV according to patient size and/or use of iterative reconstruction technique. CONTRAST:  75mL OMNIPAQUE IOHEXOL 300 MG/ML  SOLN COMPARISON:  CT 06/12/2022. FINDINGS: Cardiovascular: Small pericardial effusion, increased from previous. Heart itself is nonenlarged. Thoracic aorta has a normal course and caliber with mild atherosclerotic calcified plaque. There is a bovine type aortic arch, normal variant. Mediastinum/Nodes: Slightly patulous thoracic esophagus preserved thyroid gland. Several new areas of abnormal nodal enlargement identified. This includes a right axillary node today measuring 2.8 by 1.8 cm on series 2,  image 32. Previously this same node would have measured 19 by 8 mm and had more of a fatty hilum. Additional right axillary nodes are prominent. Left axillary node enlargement seen which is new on series 2, image 22 measuring 21 by 22 mm. Abnormal nodes seen in the right hilum with confluence area on series 2, image 73 for example measuring 2.5 1.7 cm. Few prominent left hilar nodes. The mediastinum there is abnormal soft tissue along the right side of the trachea on image 20 of series 2. Definable lesion to left of the carina anteriorly measuring 19 by 10 mm on series 2, image 54. Subcarinal focus measures 3.2 x 2.4 cm on series 2, image 77. New bilateral supraclavicular thoracic inlet nodes as well. Dominant focus on the left measures 1.8 1.6 cm on series 2, image 14. Lungs/Pleura: Left lung is clear. No consolidation, pneumothorax or effusion. Surgical changes on the right with scarring and fibrotic changes. Pleural thickening. No new lung dominant mass. Upper Abdomen: Stable low-attenuation nodules along the left adrenal gland. Previous focus measured 17 mm and today 18 mm. However there is a right adrenal nodule which previously measured 17 by 11 mm and today on series 2, image 145 2.6 by 1.8 cm. Worrisome for an adrenal metastasis. Some smaller adrenal nodules are also seen. Few prominent nodes as well along the gastrohepatic ligament and periceliac region, new from previous Musculoskeletal: Scattered degenerative changes along the spine IMPRESSION: Interval development of several abnormal lymph nodes throughout the chest including right hilum, mediastinum, supraclavicular, bilateral axillary regions as well as in the upper abdomen. Increasing right adrenal nodule worrisome for an adrenal metastasis. Stable other nodules along both adrenal glands. Surgical changes of right lobectomy. Aortic Atherosclerosis (ICD10-I70.0). Findings will be called to the ordering service by the Radiology physician assistant team.  Electronically Signed   By: Karen Kays M.D.   On: 06/28/2023 17:15     ASSESSMENT AND PLAN: This is a very pleasant 67 years old white female with stage IA non-small cell lung cancer status post right upper lobectomy with lymph node dissection.  This was diagnosed in March 2018. The patient is currently on observation and she is feeling fine today with no concerning complaints. The patient had repeat CT scan of the chest performed recently.  I personally independently reviewed the scan images and discussed the results with the patient today.  Unfortunately her scan showed interval development of several abnormal lymph nodes throughout the chest including right hilar, mediastinal, supraclavicular and  bilateral axillary as well as upper abdominal lymphadenopathy.  There was also increasing right adrenal nodule worrisome for adrenal metastasis.    Suspected Cancer Recurrence or New Malignancy Stage 1A non-small cell lung cancer adenocarcinoma, treated with right upper lobectomy in 2018. Recent chest scan shows abnormal lymph nodes in the right hilum, mediastinum, supraclavicular, bilateral axillary regions, and upper abdomen, with a worsening right adrenal nodule suggestive of possible adrenal metastasis. Differential diagnosis includes recurrence or new malignancy. Asymptomatic with no new physical complaints. Discussed need for further diagnostic imaging and biopsy to determine the nature of the lymphadenopathy and adrenal nodule. Explained that PET scan and MRI of the brain will aid in staging and identifying potential metastasis. Discussed potential outcomes, including recurrence or new malignancy, and need for biopsy to confirm diagnosis. Patient expressed understanding and agreement with the proposed plan. - Order PET scan - Order MRI of the brain - Refer to interventional radiology for ultrasound-guided biopsy of accessible lymph nodes (axilla or neck) - Schedule follow-up appointment in 2-3 weeks  to review results and discuss treatment options  Barrett's Esophagus Diagnosed via endoscopy. Managed with Protonix (pantoprazole) 40 mg twice daily. No new symptoms reported. - Continue Protonix 40 mg twice daily  General Health Maintenance Intentionally lost 70 pounds through diet and exercise, reports feeling good, and has no new health complaints. Blood work is normal. - Encourage continued healthy diet and exercise  Follow-up - Schedule follow-up appointment before or immediately after Christmas.   The patient was advised to call immediately if she has any other concerning symptoms in the interval. The total time spent in the appointment was 30 minutes.  The patient voices understanding of current disease status and treatment options and is in agreement with the current care plan. All questions were answered. The patient knows to call the clinic with any problems, questions or concerns. We can certainly see the patient much sooner if necessary.  Disclaimer: This note was dictated with voice recognition software. Similar sounding words can inadvertently be transcribed and may not be corrected upon review.

## 2023-07-03 NOTE — Progress Notes (Signed)
[name], MD  [name] PROCEDURE / BIOPSY REVIEW Date: 07/03/23  Requested Biopsy site: L axillary LAN Reason for request: r/o met h/o NSCLCa Imaging review: Best seen on CT 06/13/23 se 2 Im 17  Decision: Approved Imaging modality to perform: Ultrasound Schedule with: Patient preference (Local vs Mod Sed) Schedule for: Any VIR  Additional comments:   Please contact me with questions, concerns, or if issue pertaining to this request arise.  Dayne [name], MD [facility]       Previous Messages    ----- Message ----- From: [name] Sent: 07/03/2023  10:51 AM EST To: [name]; Ir Procedure Requests Subject: [place] Core Biopsy ( lymph nodes)                  Procedure: [place] Core Biopsy ( lymph nodes)   Reason : Left axillary or other accessible lymph node core biopsy Dx: Malignant neoplasm of unspecified part of unspecified bronchus or lung (HCC) [C34.90 (ICD-10-CM)]     History: CT Chest w/  Provider: Si Gaul, MD  Provider contact:  [phone]  [email or web address]

## 2023-07-10 ENCOUNTER — Ambulatory Visit (HOSPITAL_COMMUNITY): Admission: RE | Admit: 2023-07-10 | Payer: Medicare Other | Source: Ambulatory Visit

## 2023-07-10 ENCOUNTER — Ambulatory Visit (HOSPITAL_COMMUNITY)
Admission: RE | Admit: 2023-07-10 | Discharge: 2023-07-10 | Disposition: A | Discharge status: Home / Self Care | Payer: Medicare Other | Source: Ambulatory Visit | Attending: Internal Medicine | Admitting: Internal Medicine

## 2023-07-10 DIAGNOSIS — C3490 Malignant neoplasm of unspecified part of unspecified bronchus or lung: Secondary | ICD-10-CM | POA: Insufficient documentation

## 2023-07-10 MED ORDER — GADOBUTROL 1 MMOL/ML IV SOLN
9.0000 mL | Freq: Once | INTRAVENOUS | Status: AC | PRN
  Administered 2023-07-10: 9 mL via INTRAVENOUS

## 2023-07-16 ENCOUNTER — Ambulatory Visit (HOSPITAL_COMMUNITY)
Admission: RE | Admit: 2023-07-16 | Discharge: 2023-07-16 | Disposition: A | Discharge status: Home / Self Care | Payer: Medicare Other | Source: Ambulatory Visit | Attending: Internal Medicine | Admitting: Internal Medicine

## 2023-07-16 DIAGNOSIS — C7971 Secondary malignant neoplasm of right adrenal gland: Secondary | ICD-10-CM | POA: Insufficient documentation

## 2023-07-16 DIAGNOSIS — C3401 Malignant neoplasm of right main bronchus: Secondary | ICD-10-CM | POA: Insufficient documentation

## 2023-07-16 DIAGNOSIS — C7972 Secondary malignant neoplasm of left adrenal gland: Secondary | ICD-10-CM | POA: Diagnosis not present

## 2023-07-16 DIAGNOSIS — C3490 Malignant neoplasm of unspecified part of unspecified bronchus or lung: Secondary | ICD-10-CM | POA: Diagnosis present

## 2023-07-16 DIAGNOSIS — R918 Other nonspecific abnormal finding of lung field: Secondary | ICD-10-CM | POA: Insufficient documentation

## 2023-07-16 DIAGNOSIS — C7951 Secondary malignant neoplasm of bone: Secondary | ICD-10-CM | POA: Insufficient documentation

## 2023-07-16 LAB — GLUCOSE, CAPILLARY: Glucose-Capillary: 95 mg/dL (ref 70–99)

## 2023-07-16 MED ORDER — FLUDEOXYGLUCOSE F - 18 (FDG) INJECTION
10.2000 | Freq: Once | INTRAVENOUS | Status: AC
  Administered 2023-07-16: 10.2 via INTRAVENOUS

## 2023-07-22 ENCOUNTER — Inpatient Hospital Stay: Payer: Medicare Other

## 2023-07-22 ENCOUNTER — Inpatient Hospital Stay (HOSPITAL_BASED_OUTPATIENT_CLINIC_OR_DEPARTMENT_OTHER): Payer: Medicare Other | Admitting: Internal Medicine

## 2023-07-22 VITALS — BP 176/76 | HR 80 | Temp 97.4°F | Resp 17 | Ht 67.0 in | Wt 196.8 lb

## 2023-07-22 DIAGNOSIS — C3491 Malignant neoplasm of unspecified part of right bronchus or lung: Secondary | ICD-10-CM

## 2023-07-22 DIAGNOSIS — C3490 Malignant neoplasm of unspecified part of unspecified bronchus or lung: Secondary | ICD-10-CM

## 2023-07-22 DIAGNOSIS — C3411 Malignant neoplasm of upper lobe, right bronchus or lung: Secondary | ICD-10-CM | POA: Diagnosis not present

## 2023-07-22 LAB — CMP (CANCER CENTER ONLY)
ALT: 11 U/L (ref 0–44)
AST: 15 U/L (ref 15–41)
Albumin: 4.1 g/dL (ref 3.5–5.0)
Alkaline Phosphatase: 71 U/L (ref 38–126)
Anion gap: 5 (ref 5–15)
BUN: 10 mg/dL (ref 8–23)
CO2: 30 mmol/L (ref 22–32)
Calcium: 9.5 mg/dL (ref 8.9–10.3)
Chloride: 104 mmol/L (ref 98–111)
Creatinine: 0.7 mg/dL (ref 0.44–1.00)
GFR, Estimated: >60 mL/min (ref >60)
Glucose, Bld: 142 mg/dL — ABNORMAL HIGH (ref 70–99)
Potassium: 4.2 mmol/L (ref 3.5–5.1)
Sodium: 139 mmol/L (ref 135–145)
Total Bilirubin: 0.6 mg/dL (ref <1.2)
Total Protein: 6.7 g/dL (ref 6.5–8.1)

## 2023-07-22 LAB — CBC WITH DIFFERENTIAL (CANCER CENTER ONLY)
Abs Immature Granulocytes: 0.02 10*3/uL (ref 0.00–0.07)
Basophils Absolute: 0.1 10*3/uL (ref 0.0–0.1)
Basophils Relative: 1 %
Eosinophils Absolute: 0.3 10*3/uL (ref 0.0–0.5)
Eosinophils Relative: 3 %
HCT: 37.3 % (ref 36.0–46.0)
Hemoglobin: 12.3 g/dL (ref 12.0–15.0)
Immature Granulocytes: 0 %
Lymphocytes Relative: 20 %
Lymphs Abs: 1.7 10*3/uL (ref 0.7–4.0)
MCH: 25.4 pg — ABNORMAL LOW (ref 26.0–34.0)
MCHC: 33 g/dL (ref 30.0–36.0)
MCV: 76.9 fL — ABNORMAL LOW (ref 80.0–100.0)
Monocytes Absolute: 0.4 10*3/uL (ref 0.1–1.0)
Monocytes Relative: 5 %
Neutro Abs: 6.2 10*3/uL (ref 1.7–7.7)
Neutrophils Relative %: 71 %
Platelet Count: 303 10*3/uL (ref 150–400)
RBC: 4.85 MIL/uL (ref 3.87–5.11)
RDW: 12.6 % (ref 11.5–15.5)
WBC Count: 8.7 10*3/uL (ref 4.0–10.5)
nRBC: 0 % (ref 0.0–0.2)

## 2023-07-22 LAB — LACTATE DEHYDROGENASE: LDH: 197 U/L — ABNORMAL HIGH (ref 98–192)

## 2023-07-22 MED ORDER — DEXAMETHASONE 4 MG PO TABS: 4.0000 mg | ORAL_TABLET | Freq: Three times a day (TID) | ORAL | 0 refills | Status: DC

## 2023-07-22 NOTE — Progress Notes (Signed)
[facility] [phone]   [phone]  OFFICE PROGRESS NOTE  Koirala, Dibas, MD [address] [place] [address]  DIAGNOSIS: Stage IA (T1a, N0, M0) non-small cell lung cancer, adenocarcinoma presented with right upper lobe pulmonary nodule   PRIOR THERAPY: S/P right VATS with right upper lobectomy and mediastinal lymph node dissection under the care of Dr. Dorris Fetch on October 15, 2016.  CURRENT THERAPY: Observation.  INTERVAL HISTORY: [name] 67 y.o. female returns to the clinic today for annual follow-up visit accompanied by one of her neighbor. Discussed the use of AI scribe software for clinical note transcription with the patient, who gave verbal consent to proceed.  History of Present Illness   The patient, a 67 year old with a history of stage IA non-small cell lung cancer status post right upper lobectomy with lymph node dissection.  This was diagnosed in March 2018. She presents for a follow-up consultation. She reports feeling physically well, with no new or worsening symptoms. However, recent imaging studies, including a PET scan and MRI, indicate a recurrence of her cancer. The disease appears to be extensive, with mediastinal adenopathy, questionable local recurrence in the right hilum, bilateral adrenal metastasis, and a lesion in the T11 vertebral body. The MRI of the brain also reveals multiple lesions. The patient has a history of smoking for approximately 30 years but quit in 2004. She also has a concurrent diagnosis of diabetes.       MEDICAL HISTORY: Past Medical History:  Diagnosis Date   Adenocarcinoma of right lung, stage 1 (HCC) 11/29/2016   Cancer (HCC)    Chronic pain    Depression    takes Lexapro daily   Diabetes mellitus    takes Metformin daily and has an insulin pump.Fasting blood sugar runs250   GERD (gastroesophageal reflux disease)    takes Pantoprazole daily   History of colon  polyps    benign   Hyperlipidemia    takes Atorvastatin daily   Hypertension    takes Amlodipine and Lisinopril daily   Insomnia    takes Trazodone nightly   Nocturia    Thyroid disease    Urinary frequency    Urinary urgency     ALLERGIES:  is allergic to synthroid [levothyroxine sodium], hydrochlorothiazide, and iodine.  MEDICATIONS:  Current Outpatient Medications  Medication Sig Dispense Refill   ACCU-CHEK AVIVA PLUS test strip USE TO MONITOR GLUCOSE LEVELS TWICE A DAY E11.9 100 strip 12   ACCU-CHEK SOFTCLIX LANCETS lancets Use to monitor glucose levels BID; E11.9 (Patient taking differently: 1 each by Other route 2 (two) times daily. E11.9) 100 each 12   acetaminophen (TYLENOL) 500 MG tablet Take 1 tablet (500 mg total) by mouth every 6 (six) hours as needed. (Patient taking differently: Take 500 mg by mouth every 6 (six) hours as needed (for pain).) 30 tablet 0   atorvastatin (LIPITOR) 20 MG tablet Take 20 mg by mouth daily.  11   Blood Glucose Calibration (GLUCOSE CONTROL) SOLN 1 Bottle by In Vitro route as needed. Use to calibrate Accu-Chek Aviva Plus device prn; please provide control solution compatible with Accu-Chek Aviva Plus device. (Patient taking differently: 1 each by Other route as needed (Use to calibrate glucometer). E11.9) 1 each 1   Blood Glucose Monitoring Suppl (ACCU-CHEK AVIVA PLUS) w/Device KIT 1 each by Does not apply route 2 (two) times daily. Use to monitor glucose levels BID; E11.9 (Patient taking differently: 1 each by  Does not apply route 2 (two) times daily. E11.9) 1 kit 0   chlorthalidone (HYGROTON) 25 MG tablet Take 12.5 mg by mouth daily.      clobetasol cream (TEMOVATE) 0.05 % Apply 1 Application topically 2 (two) times daily as needed (itching).     diclofenac Sodium (VOLTAREN ARTHRITIS PAIN) 1 % GEL Apply 4 g topically 4 (four) times daily. (Patient taking differently: Apply 4 g topically 3 (three) times daily as needed (joint pain).) 150 g 0    escitalopram (LEXAPRO) 10 MG tablet Take 1 tablet (10 mg total) by mouth every morning. 90 tablet 3   ibuprofen (ADVIL) 800 MG tablet Take 800 mg by mouth every 8 (eight) hours as needed for moderate pain.     insulin glargine (LANTUS SOLOSTAR) 100 UNIT/ML Solostar Pen Inject 15 Units into the skin at bedtime. 15 mL 3   Insulin Pen Needle (B-D ULTRAFINE III SHORT PEN) 31G X 8 MM MISC USE AS DIRECTED 100 each 5   lisinopril (ZESTRIL) 10 MG tablet Take 10 mg by mouth daily.     metFORMIN (GLUCOPHAGE-XR) 500 MG 24 hr tablet Take 1 tablet (500 mg total) by mouth daily with breakfast. 90 tablet 3   pantoprazole (PROTONIX) 40 MG tablet Take 1 tablet (40 mg total) by mouth 2 (two) times daily. 180 tablet 3   tirzepatide (MOUNJARO) 15 MG/0.5ML Pen Inject 15 mg into the skin once a week. 6 mL 3   traZODone (DESYREL) 100 MG tablet Take 2 tablets (200 mg total) by mouth at bedtime. 180 tablet 3   triamcinolone cream (KENALOG) 0.1 % Apply 1 application topically 2 (two) times daily. (Patient taking differently: Apply 1 application  topically 2 (two) times daily as needed (irritation).) 30 g 0   No current facility-administered medications for this visit.    SURGICAL HISTORY:  Past Surgical History:  Procedure Laterality Date   ABDOMINAL HYSTERECTOMY     ABDOMINAL SURGERY     BIOPSY  05/28/2019   Procedure: BIOPSY;  Surgeon: West Bali, MD;  Location: [facility];  Service: Endoscopy;;  random colon/gastric/duodenum   BIOPSY  01/11/2023   Procedure: BIOPSY;  Surgeon: Lanelle Bal, DO;  [facility]: [facility];  Service: Endoscopy;;   COLONOSCOPY  2005 MAC   TICs, IH   COLONOSCOPY N/A 03/08/2014   Procedure: COLONOSCOPY;  Surgeon: West Bali, MD;  Location: [facility];  Service: Endoscopy;  Laterality: N/A;  1015   COLONOSCOPY WITH PROPOFOL N/A 05/28/2019   Procedure: COLONOSCOPY WITH PROPOFOL;  Surgeon: West Bali, MD;  [facility]: [facility];  Service: Endoscopy;  Laterality:  N/A;  9:15am   CYSTOSTOMY  11/22/2011   Procedure: CYSTOSTOMY SUPRAPUBIC;  Surgeon: Ky Barban, MD;  Location: [facility];  Service: Urology;  Laterality: N/A;   ESOPHAGOGASTRODUODENOSCOPY N/A 03/08/2014   Procedure: ESOPHAGOGASTRODUODENOSCOPY (EGD);  Surgeon: West Bali, MD;  [facility];  Service: Endoscopy;  Laterality: N/A;   ESOPHAGOGASTRODUODENOSCOPY     ESOPHAGOGASTRODUODENOSCOPY (EGD) WITH PROPOFOL N/A 05/28/2019   Procedure: ESOPHAGOGASTRODUODENOSCOPY (EGD) WITH PROPOFOL;  Surgeon: West Bali, MD;  [facility];  Service: Endoscopy;  Laterality: N/A;   ESOPHAGOGASTRODUODENOSCOPY (EGD) WITH PROPOFOL N/A 01/11/2023   Procedure: ESOPHAGOGASTRODUODENOSCOPY (EGD) WITH PROPOFOL;  Surgeon: Lanelle Bal, DO;  [facility]: [facility];  Service: Endoscopy;  Laterality: N/A;  9:45 am, asa 3   GIVENS CAPSULE STUDY N/A 06/15/2019   Procedure: GIVENS CAPSULE STUDY;  Surgeon: Jonette Eva  L, MD;  [facility]: [facility];  Service: Endoscopy;  Laterality: N/A;  7:30am   HALLUX VALGUS CORRECTION     bilateral foot surgery for bone repairs-multiple   LOBECTOMY Right 10/15/2016   Procedure: RIGHT UPPER LOBECTOMY;  Surgeon: Loreli Slot, MD;  Location: [facility] OR;  Service: Thoracic;  Laterality: Right;   LYMPH NODE DISSECTION Right 10/15/2016   Procedure: LYMPH NODE DISSECTION;  Surgeon: Loreli Slot, MD;  Location: [facility] OR;  Service: Thoracic;  Laterality: Right;   POLYPECTOMY  05/28/2019   Procedure: POLYPECTOMY;  Surgeon: West Bali, MD;  [facility];  Service: Endoscopy;;   SAVORY DILATION N/A 05/28/2019   Procedure: SAVORY DILATION;  Surgeon: West Bali, MD;  [facility]: [facility];  Service: Endoscopy;  Laterality: N/A;  15/16/17   VAGINA RECONSTRUCTION SURGERY     tvt 2006- [place]   VIDEO ASSISTED THORACOSCOPY (VATS)/WEDGE RESECTION Right 10/15/2016   Procedure: RIGHT VIDEO ASSISTED THORACOSCOPY (VATS)/WEDGE RESECTION;   Surgeon: Loreli Slot, MD;  Location: [facility];  Service: Thoracic;  Laterality: Right;    REVIEW OF SYSTEMS:  Constitutional: positive for fatigue and weight loss Eyes: negative Ears, nose, mouth, throat, and face: negative Respiratory: negative Cardiovascular: negative Gastrointestinal: negative Genitourinary:negative Integument/breast: negative Hematologic/lymphatic: negative Musculoskeletal:negative Neurological: negative Behavioral/Psych: negative Endocrine: negative Allergic/Immunologic: negative   PHYSICAL EXAMINATION: General appearance: alert, cooperative, and no distress Head: Normocephalic, without obvious abnormality, atraumatic Neck: no adenopathy, no JVD, supple, symmetrical, trachea midline, and thyroid not enlarged, symmetric, no tenderness/mass/nodules Lymph nodes: Cervical, supraclavicular, and axillary nodes normal. Resp: clear to auscultation bilaterally Back: symmetric, no curvature. ROM normal. No CVA tenderness. Cardio: regular rate and rhythm, S1, S2 normal, no murmur, click, rub or gallop GI: soft, non-tender; bowel sounds normal; no masses,  no organomegaly Extremities: extremities normal, atraumatic, no cyanosis or edema Neurologic: Alert and oriented X 3, normal strength and tone. Normal symmetric reflexes. Normal coordination and gait  ECOG PERFORMANCE STATUS: 0 - Asymptomatic  Blood pressure (!) 176/76, pulse 80, temperature (!) 97.4 F (36.3 C), temperature source Temporal, resp. rate 17, height 5\' 7"  (1.702 m), weight 196 lb 12.8 oz (89.3 kg), SpO2 100%.  LABORATORY DATA: Lab Results  Component Value Date   WBC 8.7 07/22/2023   HGB 12.3 07/22/2023   HCT 37.3 07/22/2023   MCV 76.9 (L) 07/22/2023   PLT 303 07/22/2023      Chemistry      Component Value Date/Time   NA 139 07/22/2023 1008   NA 142 10/29/2022 1158   NA 141 05/29/2017 0921   K 4.2 07/22/2023 1008   K 4.8 05/29/2017 0921   CL 104 07/22/2023 1008   CO2 30 07/22/2023  1008   CO2 28 05/29/2017 0921   BUN 10 07/22/2023 1008   BUN 21 10/29/2022 1158   BUN 13.5 05/29/2017 0921   CREATININE 0.70 07/22/2023 1008   CREATININE 0.8 05/29/2017 0921      Component Value Date/Time   CALCIUM 9.5 07/22/2023 1008   CALCIUM 9.5 05/29/2017 0921   ALKPHOS 71 07/22/2023 1008   ALKPHOS 104 05/29/2017 0921   AST 15 07/22/2023 1008   AST 17 05/29/2017 0921   ALT 11 07/22/2023 1008   ALT 20 05/29/2017 0921   BILITOT 0.6 07/22/2023 1008   BILITOT 0.40 05/29/2017 0921       RADIOGRAPHIC STUDIES: NM PET Image Restage (PS) Skull Base to Thigh (F-18 FDG) Result Date: 07/21/2023 CLINICAL DATA:  Subsequent treatment strategy for  non-small cell lung cancer. EXAM: NUCLEAR MEDICINE PET SKULL BASE TO THIGH TECHNIQUE: 10.2 mCi F-18 FDG was injected intravenously. Full-ring PET imaging was performed from the skull base to thigh after the radiotracer. CT data was obtained and used for attenuation correction and anatomic localization. Fasting blood glucose: 95 mg/dl COMPARISON:  CT 70/62/3762 FINDINGS: NECK: No hypermetabolic lymph nodes in the neck. Incidental CT findings: None. CHEST: Intensely hypermetabolic LEFT and RIGHT supraclavicular/thoracic inlet nodes. Lymph node adjacent to the LEFT lobe of thyroid gland with SUV max equal 6.2 measures 17 mm on image 46. Bilateral hypermetabolic axillary lymph nodes. Example RIGHT axial lymph node measures 22 mm on image 53 with SUV max equal 13.4. Enlarged paratracheal and subcarinal nodes which are intensely hypermetabolic. For example subcarinal node measuring 20 mm short axis with SUV max equal 12.8. Intensely hypermetabolic RIGHT hilar lymph node. Postsurgical change in the RIGHT lung consistent resection. no discrete pulmonary nodules. There is metabolic activity in the RIGHT hilum at the inferior margin of the staple line on image 67. Incidental CT findings: None. ABDOMEN/PELVIS: Bilateral hypermetabolic adrenal glands Example LEFT gland  measures 18 mm with SUV max equal 8.1. Hypermetabolic lymph node along the LEFT external iliac artery measures 14 mm SUV max equal 11.9 on image 165. No liver metastasis. Incidental CT findings: None. SKELETON: Hypermetabolic lesion in the T 11 vertebral bodies SUV max 9.2 on image 101. This is corresponding periphery sclerotic centrally lytic lesion measuring 12 mm on image 101. Incidental CT findings: None. IMPRESSION: 1. Extensive hypermetabolic metastatic adenopathy in the thorax involving the thoracic inlet, mediastinum, and axillary nodes. 2. Potential local recurrence at the RIGHT hilum along inferior margin of the resection staple line. 3. Bilateral hypermetabolic adrenal metastatic lesions. 4. Hypermetabolic metastatic lesion in the T11 vertebral body. . Electronically Signed   By: Genevive Bi M.D.   On: 07/21/2023 12:10   MR BRAIN W WO CONTRAST Result Date: 07/19/2023 CLINICAL DATA:  Non-small cell lung cancer, staging EXAM: MRI HEAD WITHOUT AND WITH CONTRAST TECHNIQUE: Multiplanar, multiecho pulse sequences of the brain and surrounding structures were obtained without and with intravenous contrast. CONTRAST:  9mL GADAVIST GADOBUTROL 1 MMOL/ML IV SOLN COMPARISON:  06/11/2027 FINDINGS: Brain: Multiple enhancing lesions in the brain parenchyma. Left posterior parafalcine frontal lobe, 6 x 3 x 8 mm (series 16, image 142). Right anterior frontal lobe, 10 x 12 x 12 mm (series 16, image 139). Left lateral frontal lobe, 8 x 6 x 7 mm, (series 16, image 136). Anteromedial left frontal lobe, 2 mm (series 16, image 127). Anterior right frontal lobe, punctate (series 16, image 123). Anterior, medial right frontal lobe, 4 x 4 x 3 mm (series 16, image 123). Medial left parietal lobe, 7 x 6 x 6 mm (series 16, image 123). Left thalamus, 3 mm (series 16, image 99). Right thalamus/internal capsule, 7 x 6 x 6 mm (series 16, image 97). Right posterior temporal lobe, 5 x 5 x 6 mm (series 16, image 91). Right occipital  lobe, 6 x 5 x 5 mm (series 16, image 85). Left anterolateral temporal lobe, 5 x 2 x 3 mm (series 16, image 65). Right cerebellum, 6 x 5 x 5 mm (series 16, image 45). Left cerebellum 8 x 9 x 7 mm (series 16, image 43). The larger lesions are associated with increased T2 hyperintense signal, likely edema. No evidence of acute infarct, hemorrhage, mass, mass effect, or midline shift. No hydrocephalus or extra-axial collection. Pituitary and craniocervical junction within normal limits.  Vascular: Normal arterial flow voids. Normal arterial and venous enhancement. Skull and upper cervical spine: Normal marrow signal. Sinuses/Orbits: Mild mucosal thickening in the ethmoid air cells and maxillary sinuses. No acute finding in the orbits. Other: Trace fluid in the right mastoid air cells. IMPRESSION: Multiple enhancing lesions in the brain parenchyma, as described above, consistent with metastatic disease. The larger lesions are associated with increased T2 hyperintense signal, likely edema. No significant mass effect or midline shift. Electronically Signed   By: Wiliam Ke M.D.   On: 07/19/2023 19:33     ASSESSMENT AND PLAN: This is a very pleasant 67 years old white female with stage IV (T1a, N3, M1 C) non-small cell lung cancer initially diagnosed as stage IA non-small cell lung cancer status post right upper lobectomy with lymph node dissection.  This was diagnosed in March 2018 with evidence for disease recurrence and metastasis in December 2024. The patient has been on observation and she is feeling fine today with no concerning complaints. The patient had repeat CT scan of the chest performed recently.  I personally independently reviewed the scan images and discussed the results with the patient today.  Unfortunately her scan showed interval development of several abnormal lymph nodes throughout the chest including right hilar, mediastinal, supraclavicular and bilateral axillary as well as upper abdominal  lymphadenopathy.  There was also increasing right adrenal nodule worrisome for adrenal metastasis. She had a PET scan as well as MRI of the brain performed recently.  I personally and independently reviewed the imaging studies and showed the images to the patient today.    Metastatic Adenocarcinoma Stage IV adenocarcinoma with extensive metastasis, initially diagnosed with stage IA non-small cell lung cancer status post right upper lobectomy with lymph node dissection.  This was diagnosed in March 2018. PET scan shows mediastinal adenopathy, thoracic inlet, mediastinum, axillary regions, bilateral hypermetabolic adrenal metastasis, and T11 vertebral lesion. MRI reveals multiple brain lesions. Prognosis: 3-6 months without treatment, up to 2 years with chemoimmunotherapy, potentially longer with target mutation. Discussed whole brain radiation, Decadron for swelling, and potential chemoimmunotherapy post-radiation. Biopsy and molecular testing needed for targeted therapy. - Refer to radiation oncologist for whole brain radiation - Prescribe Decadron 4 mg every 8 hours, taper during radiation - Schedule lymph node biopsy - Send biopsy for molecular testing - Order blood test for molecular analysis (Guardant 360) - Discuss prognosis and treatment options  Diabetes Mellitus Diabetes mellitus potentially affected by Decadron. - Consult endocrinologist, Dr. Lavonne Chick, for medication adjustment - Consider reducing Decadron to three times a day to mitigate hyperglycemia  Follow-up - Urgent appointment with radiation oncologist - Follow-up one week post-biopsy to discuss results and next steps - Send to lab for blood test.   The patient will come back for follow-up visit in 2-3 weeks for evaluation and more detailed discussion of her treatment options based on the biopsy and the molecular studies. The total time spent in the appointment was 30 minutes.  The patient voices understanding of current disease  status and treatment options and is in agreement with the current care plan. All questions were answered. The patient knows to call the clinic with any problems, questions or concerns. We can certainly see the patient much sooner if necessary.  Disclaimer: This note was dictated with voice recognition software. Similar sounding words can inadvertently be transcribed and may not be corrected upon review.

## 2023-07-23 ENCOUNTER — Ambulatory Visit
Admission: RE | Admit: 2023-07-23 | Discharge: 2023-07-23 | Disposition: A | Discharge status: Home / Self Care | Payer: Medicare Other | Source: Ambulatory Visit | Attending: Radiation Oncology | Admitting: Radiation Oncology

## 2023-07-23 ENCOUNTER — Encounter: Payer: Self-pay | Admitting: Radiation Oncology

## 2023-07-23 VITALS — BP 159/69 | HR 67 | Temp 97.6°F | Resp 20 | Ht 67.0 in | Wt 194.0 lb

## 2023-07-23 DIAGNOSIS — G8929 Other chronic pain: Secondary | ICD-10-CM | POA: Insufficient documentation

## 2023-07-23 DIAGNOSIS — Z860100 Personal history of colon polyps, unspecified: Secondary | ICD-10-CM | POA: Insufficient documentation

## 2023-07-23 DIAGNOSIS — Z794 Long term (current) use of insulin: Secondary | ICD-10-CM | POA: Diagnosis not present

## 2023-07-23 DIAGNOSIS — C7951 Secondary malignant neoplasm of bone: Secondary | ICD-10-CM | POA: Diagnosis not present

## 2023-07-23 DIAGNOSIS — Z791 Long term (current) use of non-steroidal anti-inflammatories (NSAID): Secondary | ICD-10-CM | POA: Insufficient documentation

## 2023-07-23 DIAGNOSIS — C7931 Secondary malignant neoplasm of brain: Secondary | ICD-10-CM | POA: Diagnosis present

## 2023-07-23 DIAGNOSIS — K219 Gastro-esophageal reflux disease without esophagitis: Secondary | ICD-10-CM | POA: Insufficient documentation

## 2023-07-23 DIAGNOSIS — C7971 Secondary malignant neoplasm of right adrenal gland: Secondary | ICD-10-CM | POA: Insufficient documentation

## 2023-07-23 DIAGNOSIS — C7972 Secondary malignant neoplasm of left adrenal gland: Secondary | ICD-10-CM | POA: Insufficient documentation

## 2023-07-23 DIAGNOSIS — E079 Disorder of thyroid, unspecified: Secondary | ICD-10-CM | POA: Insufficient documentation

## 2023-07-23 DIAGNOSIS — R590 Localized enlarged lymph nodes: Secondary | ICD-10-CM | POA: Diagnosis not present

## 2023-07-23 DIAGNOSIS — I10 Essential (primary) hypertension: Secondary | ICD-10-CM | POA: Diagnosis not present

## 2023-07-23 DIAGNOSIS — G4700 Insomnia, unspecified: Secondary | ICD-10-CM | POA: Diagnosis not present

## 2023-07-23 DIAGNOSIS — Z79899 Other long term (current) drug therapy: Secondary | ICD-10-CM | POA: Insufficient documentation

## 2023-07-23 DIAGNOSIS — C3411 Malignant neoplasm of upper lobe, right bronchus or lung: Secondary | ICD-10-CM | POA: Diagnosis not present

## 2023-07-23 DIAGNOSIS — E785 Hyperlipidemia, unspecified: Secondary | ICD-10-CM | POA: Diagnosis not present

## 2023-07-23 DIAGNOSIS — E119 Type 2 diabetes mellitus without complications: Secondary | ICD-10-CM | POA: Diagnosis not present

## 2023-07-23 DIAGNOSIS — Z7985 Long-term (current) use of injectable non-insulin antidiabetic drugs: Secondary | ICD-10-CM | POA: Insufficient documentation

## 2023-07-23 DIAGNOSIS — C3491 Malignant neoplasm of unspecified part of right bronchus or lung: Secondary | ICD-10-CM

## 2023-07-23 NOTE — Progress Notes (Signed)
Thoracic Location of Tumor / Histology: RUL Lung metastatic to brain, bone, adrenal glands.  Patient presented for annual follow up of Right Lung cancer.  Recent imaging was indicative of recurrence of her cancer.  Mediastinal adenopathy, questionable local recurrent in the right hilum, bilateral adrenal metastasis, and a lesion in the T11 verterbral body.  MRI Brain also revealed multiple lesions.  She is here to discuss whole brain radiaiton.  MRI Brain 07/10/2023: Multiple enhancing lesion in the brain parenchyma.   Past/Anticipated interventions by cardiothoracic surgery, if any:  Dr. Dorris Fetch 10/15/2016 -Right VATS with right upper lobectomy and mediastinal node dissection.  Past/Anticipated interventions by medical oncology, if any:  Dr. Arbutus Ped 07/22/2023 - Refer to radiation oncologist for whole brain radiation - Prescribe Decadron 4 mg every 8 hours, taper during radiation   Tobacco/Marijuana/Snuff/ETOH use: No  Signs/Symptoms Weight changes, if any:  No Respiratory complaints, if any: No Hemoptysis, if any: No Pain issues, if any:  0/10  Recent neurologic symptoms, if any:  Seizures: No Headaches: No Nausea: No Dizziness/ataxia: No Difficulty with hand coordination: No Focal numbness/weakness: No Visual deficits/changes: no Confusion/Memory deficits: no Decadron: Yes, started 07/22/2023 Decadron 4 mg  TID.  SAFETY ISSUES: Prior radiation? No Pacemaker/ICD?  No Possible current pregnancy? Hysterectomy Is the patient on methotrexate? No  Current Complaints / other details:

## 2023-07-23 NOTE — Progress Notes (Addendum)
Radiation Oncology         [phone] ________________________________  Initial Outpatient Consultation  Name: [name] MRN: [id number]  Date: 07/23/2023  DOB: 1956/07/08  ZD:GUYQIHK, Dibas, MD  Si Gaul, MD   REFERRING PHYSICIAN: Si Gaul, MD  DIAGNOSIS:    ICD-10-CM   1. Metastatic cancer to brain [facility])  C79.31       Stage IV (T1a, N3, M1 C) non-small cell lung cancer initially diagnosed as stage IA non-small cell lung cancer status post right upper lobectomy with lymph node dissection, diagnosed in March of 2018. Now with evidence of disease recurrence and and wide spread metastatic disease, including intracranial metastatic disease, in December 2024.   CHIEF COMPLAINT: Here to discuss management of intracranial metastatic disease likely from a non-small cell lung cancer primary   HISTORY OF PRESENT ILLNESS::[name] is a 67 y.o. female who was initially diagnosed with non small cell adenocarcinoma of the right lung in 2018, s/p right upper lobectomy and mediastinal lymph node dissection under the care of Dr. Dorris Fetch on October 15, 2016. (Other than her recent recurrence), she previously remained without evidence of disease recurrence and continued with surveillance under Dr. Arbutus Ped with routine annual imaging.   She recently presented for a routine surveillance chest CT on 06/13/23 which revealed: interval development of several abnormal lymph nodes throughout the chest, including in the right hilum, mediastinum, supraclavicular, bilateral axillary regions as well as in the upper abdomen. An increasing adrenal nodule worrisome for adrenal metastatic disease was also demonstrated.   She was accordingly referred back to Dr. Arbutus Ped on 07/03/23 for further evaluation and management. During that visit, the patient denied any respiratory symptoms and endorsed feeling well overall. She was ultimately recommended further staging work-up (dates and results  detailed as follows):  -- MRI of the brain on 07/10/23 unfortunately demonstrated multiple enhancing lesions in the brain parenchyma, consistent with metastatic disease. The larger lesions appeared to be associated with increased T2 hyperintense signal, likely representing edema. No significant mass effect or evidence of midline shift were demonstrated.  There appear to be 14 lesions and none of the lesions appear within 5 mm of optic structures or the hippocampi -- Restaging PET scan on 07/16/23 demonstrated: extensive hypermetabolic metastatic adenopathy in the thorax involving the thoracic inlet, mediastinum, and axillary nodes; evidence of potential local recurrence at the right hilum along the inferior margin of the resection staple line; bilateral hypermetabolic adrenal metastatic lesions; and a hypermetabolic metastatic lesion in the T11 vertebral body   She has had a thorough discussion with Dr. Arbutus Ped regarding her prognosis related to her extensive disease and subsequent life expectancy. Moving forward, she is agreeable to pursue nodal biopsies and molecular studies to determine a targeted therapy. She has also agreed to start decadron. For her intracranial metastatic disease, Dr. Arbutus Ped has also recommended evaluation for whole brain radiation therapy which we will discuss in detail today.   She is currently scheduled to undergo nodal biopsies on 08/01/22 after the new year.   She denies any pain.  She denies dizziness, neurologic symptoms, headaches, seizures, back pain or shortness of breath.  She reports she was asymptomatic before her recent scans. She likes to gamble in her free time.  She is accompanied by a supportive friend.  She does not smoke.  She reports her cognition is stable and her friend does not report any concerns to this effect per her observations  She is left-handed.  PREVIOUS RADIATION THERAPY: No  PAST MEDICAL HISTORY:  has a past medical history of Adenocarcinoma  of right lung, stage 1 (HCC) (11/29/2016), Cancer (HCC), Chronic pain, Depression, Diabetes mellitus, GERD (gastroesophageal reflux disease), History of colon polyps, Hyperlipidemia, Hypertension, Insomnia, Nocturia, Thyroid disease, Urinary frequency, and Urinary urgency.    PAST SURGICAL HISTORY: Past Surgical History:  Procedure Laterality Date   ABDOMINAL HYSTERECTOMY     ABDOMINAL SURGERY     BIOPSY  05/28/2019   Procedure: BIOPSY;  Surgeon: West Bali, MD;  [facility];  Service: Endoscopy;;  random colon/gastric/duodenum   BIOPSY  01/11/2023   Procedure: BIOPSY;  Surgeon: Lanelle Bal, DO;  [facility];  Service: Endoscopy;;   COLONOSCOPY  2005 MAC   TICs, IH   COLONOSCOPY N/A 03/08/2014   Procedure: COLONOSCOPY;  Surgeon: West Bali, MD;  [facility];  Service: Endoscopy;  Laterality: N/A;  1015   COLONOSCOPY WITH PROPOFOL N/A 05/28/2019   Procedure: COLONOSCOPY WITH PROPOFOL;  Surgeon: West Bali, MD;  [facility];  Service: Endoscopy;  Laterality: N/A;  9:15am   CYSTOSTOMY  11/22/2011   Procedure: CYSTOSTOMY SUPRAPUBIC;  Surgeon: Ky Barban, MD;  Location: [facility];  Service: Urology;  Laterality: N/A;   ESOPHAGOGASTRODUODENOSCOPY N/A 03/08/2014   Procedure: ESOPHAGOGASTRODUODENOSCOPY (EGD);  Surgeon: West Bali, MD;  [facility];  Service: Endoscopy;  Laterality: N/A;   ESOPHAGOGASTRODUODENOSCOPY     ESOPHAGOGASTRODUODENOSCOPY (EGD) WITH PROPOFOL N/A 05/28/2019   Procedure: ESOPHAGOGASTRODUODENOSCOPY (EGD) WITH PROPOFOL;  Surgeon: West Bali, MD;  [facility];  Service: Endoscopy;  Laterality: N/A;   ESOPHAGOGASTRODUODENOSCOPY (EGD) WITH PROPOFOL N/A 01/11/2023   Procedure: ESOPHAGOGASTRODUODENOSCOPY (EGD) WITH PROPOFOL;  Surgeon: Lanelle Bal, DO;  [facility];  Service: Endoscopy;  Laterality: N/A;  9:45 am, asa 3   GIVENS CAPSULE STUDY N/A 06/15/2019   Procedure:  GIVENS CAPSULE STUDY;  Surgeon: West Bali, MD;  [facility];  Service: Endoscopy;  Laterality: N/A;  7:30am   HALLUX VALGUS CORRECTION     bilateral foot surgery for bone repairs-multiple   LOBECTOMY Right 10/15/2016   Procedure: RIGHT UPPER LOBECTOMY;  Surgeon: Loreli Slot, MD;  Location: [facility] OR;  Service: Thoracic;  Laterality: Right;   LYMPH NODE DISSECTION Right 10/15/2016   Procedure: LYMPH NODE DISSECTION;  Surgeon: Loreli Slot, MD;  Location: [facility] OR;  Service: Thoracic;  Laterality: Right;   POLYPECTOMY  05/28/2019   Procedure: POLYPECTOMY;  Surgeon: West Bali, MD;  [facility];  Service: Endoscopy;;   SAVORY DILATION N/A 05/28/2019   Procedure: SAVORY DILATION;  Surgeon: West Bali, MD;  [facility];  Service: Endoscopy;  Laterality: N/A;  15/16/17   VAGINA RECONSTRUCTION SURGERY     tvt 2006- [place]   VIDEO ASSISTED THORACOSCOPY (VATS)/WEDGE RESECTION Right 10/15/2016   Procedure: RIGHT VIDEO ASSISTED THORACOSCOPY (VATS)/WEDGE RESECTION;  Surgeon: Loreli Slot, MD;  Location: [facility] OR;  Service: Thoracic;  Laterality: Right;    FAMILY HISTORY: family history includes Arthritis in an other family member; Asthma in an other family member; Cancer in her brother and mother; Diabetes in an other family member.  SOCIAL HISTORY:  reports that she has quit smoking. Her smoking use included cigarettes. She has a 32 pack-year smoking history. She has never used smokeless tobacco. She reports that she does not drink alcohol and does not use drugs.  ALLERGIES: Synthroid [levothyroxine sodium], Hydrochlorothiazide, and  Iodine  MEDICATIONS:  Current Outpatient Medications  Medication Sig Dispense Refill   meloxicam (MOBIC) 7.5 MG tablet Take 7.5 mg by mouth daily.     ACCU-CHEK AVIVA PLUS test strip USE TO MONITOR GLUCOSE LEVELS TWICE A DAY E11.9 100 strip 12   ACCU-CHEK SOFTCLIX LANCETS lancets Use to monitor  glucose levels BID; E11.9 (Patient taking differently: 1 each by Other route 2 (two) times daily. E11.9) 100 each 12   acetaminophen (TYLENOL) 500 MG tablet Take 1 tablet (500 mg total) by mouth every 6 (six) hours as needed. (Patient taking differently: Take 500 mg by mouth every 6 (six) hours as needed (for pain).) 30 tablet 0   atorvastatin (LIPITOR) 20 MG tablet Take 20 mg by mouth daily.  11   Blood Glucose Calibration (GLUCOSE CONTROL) SOLN 1 Bottle by In Vitro route as needed. Use to calibrate Accu-Chek Aviva Plus device prn; please provide control solution compatible with Accu-Chek Aviva Plus device. (Patient taking differently: 1 each by Other route as needed (Use to calibrate glucometer). E11.9) 1 each 1   Blood Glucose Monitoring Suppl (ACCU-CHEK AVIVA PLUS) w/Device KIT 1 each by Does not apply route 2 (two) times daily. Use to monitor glucose levels BID; E11.9 (Patient taking differently: 1 each by Does not apply route 2 (two) times daily. E11.9) 1 kit 0   chlorthalidone (HYGROTON) 25 MG tablet Take 12.5 mg by mouth daily.      clobetasol cream (TEMOVATE) 0.05 % Apply 1 Application topically 2 (two) times daily as needed (itching).     cyclobenzaprine (FLEXERIL) 10 MG tablet TAKE 1 TABLET BY MOUTH TWICE A DAY AS NEEDED FOR MUSCLE SPASMS     dexamethasone (DECADRON) 4 MG tablet Take 1 tablet (4 mg total) by mouth 3 (three) times daily. 30 tablet 0   diclofenac Sodium (VOLTAREN ARTHRITIS PAIN) 1 % GEL Apply 4 g topically 4 (four) times daily. (Patient taking differently: Apply 4 g topically 3 (three) times daily as needed (joint pain).) 150 g 0   escitalopram (LEXAPRO) 10 MG tablet Take 1 tablet (10 mg total) by mouth every morning. 90 tablet 3   ibuprofen (ADVIL) 800 MG tablet Take 800 mg by mouth every 8 (eight) hours as needed for moderate pain.     insulin glargine (LANTUS SOLOSTAR) 100 UNIT/ML Solostar Pen Inject 15 Units into the skin at bedtime. 15 mL 3   Insulin Pen Needle (B-D  ULTRAFINE III SHORT PEN) 31G X 8 MM MISC USE AS DIRECTED 100 each 5   lisinopril (ZESTRIL) 10 MG tablet Take 10 mg by mouth daily.     metFORMIN (GLUCOPHAGE-XR) 500 MG 24 hr tablet Take 1 tablet (500 mg total) by mouth daily with breakfast. 90 tablet 3   pantoprazole (PROTONIX) 40 MG tablet Take 1 tablet (40 mg total) by mouth 2 (two) times daily. 180 tablet 3   tirzepatide (MOUNJARO) 15 MG/0.5ML Pen Inject 15 mg into the skin once a week. 6 mL 3   traZODone (DESYREL) 100 MG tablet Take 2 tablets (200 mg total) by mouth at bedtime. 180 tablet 3   triamcinolone cream (KENALOG) 0.1 % Apply 1 application topically 2 (two) times daily. (Patient taking differently: Apply 1 application  topically 2 (two) times daily as needed (irritation).) 30 g 0   No current facility-administered medications for this encounter.    REVIEW OF SYSTEMS:  Notable for that above.   PHYSICAL EXAM:  height is 5\' 7"  (1.702 m) and weight is 194 lb (  88 kg). Her temperature is 97.6 F (36.4 C). Her blood pressure is 159/69 (abnormal) and her pulse is 67. Her respiration is 20 and oxygen saturation is 97%.   General: Alert and oriented, in no acute distress   HEENT: Head is normocephalic. Extraocular movements are intact. Oropharynx is clear. Neck: Neck is supple, no palpable cervical or supraclavicular lymphadenopathy. Heart: Regular in rate and rhythm with no murmurs, rubs, or gallops. Chest: Clear to auscultation bilaterally, with no rhonchi, wheezes, or rales. Abdomen: Soft, nontender, nondistended, with no rigidity or guarding. Extremities: No cyanosis or edema. Lymphatics: see Neck Exam Skin: No concerning lesions. Musculoskeletal: symmetric strength and muscle tone throughout. Neurologic: Cranial nerves II through XII are grossly intact. No obvious focalities. Speech is fluent. Coordination is intact. Psychiatric: Judgment and insight are intact. Affect is appropriate.  KPS = 100  100 - Normal; no complaints; no  evidence of disease. 90   - Able to carry on normal activity; minor signs or symptoms of disease. 80   - Normal activity with effort; some signs or symptoms of disease. 54   - Cares for self; unable to carry on normal activity or to do active work. 60   - Requires occasional assistance, but is able to care for most of his personal needs. 50   - Requires considerable assistance and frequent medical care. 40   - Disabled; requires special care and assistance. 30   - Severely disabled; hospital admission is indicated although death not imminent. 20   - Very sick; hospital admission necessary; active supportive treatment necessary. 10   - Moribund; fatal processes progressing rapidly. 0     - Dead  Karnofsky DA, Abelmann WH, Craver LS and Burchenal [facility] [phone]) The use of the nitrogen mustards in the palliative treatment of carcinoma: with particular reference to bronchogenic carcinoma Cancer 1 634-56  ECOG = 0  0 - Asymptomatic (Fully active, able to carry on all predisease activities without restriction)  1 - Symptomatic but completely ambulatory (Restricted in physically strenuous activity but ambulatory and able to carry out work of a light or sedentary nature. For example, light housework, office work)  2 - Symptomatic, <50% in bed during the day (Ambulatory and capable of all self care but unable to carry out any work activities. Up and about more than 50% of waking hours)  3 - Symptomatic, >50% in bed, but not bedbound (Capable of only limited self-care, confined to bed or chair 50% or more of waking hours)  4 - Bedbound (Completely disabled. Cannot carry on any self-care. Totally confined to bed or chair)  5 - Death   Santiago Glad MM, Creech RH, Tormey DC, et al. [phone]). "Toxicity and response criteria of the [facility]". Am. Evlyn Clines. Oncol. 5 (6): 649-55   LABORATORY DATA:  Lab Results  Component Value Date   WBC 8.7 07/22/2023   HGB 12.3 07/22/2023   HCT 37.3  07/22/2023   MCV 76.9 (L) 07/22/2023   PLT 303 07/22/2023   CMP     Component Value Date/Time   NA 139 07/22/2023 1008   NA 142 10/29/2022 1158   NA 141 05/29/2017 0921   K 4.2 07/22/2023 1008   K 4.8 05/29/2017 0921   CL 104 07/22/2023 1008   CO2 30 07/22/2023 1008   CO2 28 05/29/2017 0921   GLUCOSE 142 (H) 07/22/2023 1008   GLUCOSE 184 (H) 05/29/2017 0921   BUN 10 07/22/2023 1008   BUN 21 10/29/2022 1158  BUN 13.5 05/29/2017 0921   CREATININE 0.70 07/22/2023 1008   CREATININE 0.8 05/29/2017 0921   CALCIUM 9.5 07/22/2023 1008   CALCIUM 9.5 05/29/2017 0921   PROT 6.7 07/22/2023 1008   PROT 6.2 10/29/2022 1158   PROT 7.1 05/29/2017 0921   ALBUMIN 4.1 07/22/2023 1008   ALBUMIN 4.3 10/29/2022 1158   ALBUMIN 80 mg/L 05/17/2022 0922   ALBUMIN 3.6 05/29/2017 0921   AST 15 07/22/2023 1008   AST 17 05/29/2017 0921   ALT 11 07/22/2023 1008   ALT 20 05/29/2017 0921   ALKPHOS 71 07/22/2023 1008   ALKPHOS 104 05/29/2017 0921   BILITOT 0.6 07/22/2023 1008   BILITOT 0.40 05/29/2017 0921   GFR 68.61 10/14/2017 0826   EGFR 78 10/29/2022 1158   GFRNONAA >60 07/22/2023 1008         RADIOGRAPHY: NM PET Image Restage (PS) Skull Base to Thigh (F-18 FDG) Result Date: 07/21/2023 CLINICAL DATA:  Subsequent treatment strategy for non-small cell lung cancer. EXAM: NUCLEAR MEDICINE PET SKULL BASE TO THIGH TECHNIQUE: 10.2 mCi F-18 FDG was injected intravenously. Full-ring PET imaging was performed from the skull base to thigh after the radiotracer. CT data was obtained and used for attenuation correction and anatomic localization. Fasting blood glucose: 95 mg/dl COMPARISON:  CT 66/44/0347 FINDINGS: NECK: No hypermetabolic lymph nodes in the neck. Incidental CT findings: None. CHEST: Intensely hypermetabolic LEFT and RIGHT supraclavicular/thoracic inlet nodes. Lymph node adjacent to the LEFT lobe of thyroid gland with SUV max equal 6.2 measures 17 mm on image 46. Bilateral hypermetabolic axillary  lymph nodes. Example RIGHT axial lymph node measures 22 mm on image 53 with SUV max equal 13.4. Enlarged paratracheal and subcarinal nodes which are intensely hypermetabolic. For example subcarinal node measuring 20 mm short axis with SUV max equal 12.8. Intensely hypermetabolic RIGHT hilar lymph node. Postsurgical change in the RIGHT lung consistent resection. no discrete pulmonary nodules. There is metabolic activity in the RIGHT hilum at the inferior margin of the staple line on image 67. Incidental CT findings: None. ABDOMEN/PELVIS: Bilateral hypermetabolic adrenal glands Example LEFT gland measures 18 mm with SUV max equal 8.1. Hypermetabolic lymph node along the LEFT external iliac artery measures 14 mm SUV max equal 11.9 on image 165. No liver metastasis. Incidental CT findings: None. SKELETON: Hypermetabolic lesion in the T 11 vertebral bodies SUV max 9.2 on image 101. This is corresponding periphery sclerotic centrally lytic lesion measuring 12 mm on image 101. Incidental CT findings: None. IMPRESSION: 1. Extensive hypermetabolic metastatic adenopathy in the thorax involving the thoracic inlet, mediastinum, and axillary nodes. 2. Potential local recurrence at the RIGHT hilum along inferior margin of the resection staple line. 3. Bilateral hypermetabolic adrenal metastatic lesions. 4. Hypermetabolic metastatic lesion in the T11 vertebral body. . Electronically Signed   By: Genevive Bi M.D.   On: 07/21/2023 12:10   MR BRAIN W WO CONTRAST Result Date: 07/19/2023 CLINICAL DATA:  Non-small cell lung cancer, staging EXAM: MRI HEAD WITHOUT AND WITH CONTRAST TECHNIQUE: Multiplanar, multiecho pulse sequences of the brain and surrounding structures were obtained without and with intravenous contrast. CONTRAST:  9mL GADAVIST GADOBUTROL 1 MMOL/ML IV SOLN COMPARISON:  06/11/2027 FINDINGS: Brain: Multiple enhancing lesions in the brain parenchyma. Left posterior parafalcine frontal lobe, 6 x 3 x 8 mm (series 16,  image 142). Right anterior frontal lobe, 10 x 12 x 12 mm (series 16, image 139). Left lateral frontal lobe, 8 x 6 x 7 mm, (series 16, image 136). Anteromedial left frontal  lobe, 2 mm (series 16, image 127). Anterior right frontal lobe, punctate (series 16, image 123). Anterior, medial right frontal lobe, 4 x 4 x 3 mm (series 16, image 123). Medial left parietal lobe, 7 x 6 x 6 mm (series 16, image 123). Left thalamus, 3 mm (series 16, image 99). Right thalamus/internal capsule, 7 x 6 x 6 mm (series 16, image 97). Right posterior temporal lobe, 5 x 5 x 6 mm (series 16, image 91). Right occipital lobe, 6 x 5 x 5 mm (series 16, image 85). Left anterolateral temporal lobe, 5 x 2 x 3 mm (series 16, image 65). Right cerebellum, 6 x 5 x 5 mm (series 16, image 45). Left cerebellum 8 x 9 x 7 mm (series 16, image 43). The larger lesions are associated with increased T2 hyperintense signal, likely edema. No evidence of acute infarct, hemorrhage, mass, mass effect, or midline shift. No hydrocephalus or extra-axial collection. Pituitary and craniocervical junction within normal limits. Vascular: Normal arterial flow voids. Normal arterial and venous enhancement. Skull and upper cervical spine: Normal marrow signal. Sinuses/Orbits: Mild mucosal thickening in the ethmoid air cells and maxillary sinuses. No acute finding in the orbits. Other: Trace fluid in the right mastoid air cells. IMPRESSION: Multiple enhancing lesions in the brain parenchyma, as described above, consistent with metastatic disease. The larger lesions are associated with increased T2 hyperintense signal, likely edema. No significant mass effect or midline shift. Electronically Signed   By: Wiliam Ke M.D.   On: 07/19/2023 19:33      IMPRESSION/PLAN:    ICD-10-CM   1. Metastatic cancer to brain [facility])  C79.31      I had a lengthy discussion with the patient after reviewing their latest MRI results with them.  Unfortunately, the MRI shows numerous brain  metastases, as described above, totaling approximately 14 separate foci.     We spoke about whole brain radiotherapy versus stereotactic radiosurgery to the brain. We spoke about the differing risks benefits and side effects of both of these treatments. During part of our discussion, we spoke about the hair loss, fatigue and cognitive effects that can result from whole brain radiotherapy.  Additionally, we spoke about radionecrosis that can result from stereotactic radiosurgery. I explained that whole brain radiotherapy is more comprehensive and therefore can decrease the substantial chance of recurrences elsewhere in the brain, while stereotactic radiosurgery only treats the areas of gross disease while sparing the rest of the brain parenchyma.  She understands there is a significantly higher risk of needing more treatment to the brain in the future if she undergoes targeted stereotactic radiosurgery but she would be monitored closely with MRIs and with the goal of avoiding whole brain RT side effects indefinitely.   She qualifies and is a terrific candidate for the CE.7 Clinical Trial which randomizes patients with numerous brain metastases to single fraction stereotactic radiosurgery versus 10 fractions of hippocampal sparing whole brain radiation therapy plus memantine.  We discussed the pros and cons of both treatment methods and the fact that there is not a clear standard therapy for patients with as many brain metastases as the MRI is showing.  She understands that enrollment on this trial is completely optional and she would be randomized (would not be able to choose her treatment method.)  That said, it is unclear which treatment option if either is more advantageous, and I am equivocal regarding the 2 arms of her trial.   After lengthy discussion, the patient is enthusiastic about  enrolling on this clinical trial.  She met with our research nurse today.  Our research nurse will work on randomization  and Ms Morrical will be consented accordingly.  She understands we are going to try to start her treatment as soon as possible and if it begins while I am on vacation next week, one of my partners will cover as needed.  Given that she is asymptomatic I recommended that she stop her recently started dexamethasone and we will resume this if she develops significant neurologic symptoms.   On date of service, in total, I spent 60 minutes on this encounter. Patient was seen in person.  __________________________________________   Lonie Peak, MD  This document serves as a record of services personally performed by Lonie Peak, MD. It was created on her behalf by Neena Rhymes, a trained medical scribe. The creation of this record is based on the scribe's personal observations and the provider's statements to them. This document has been checked and approved by the attending provider.

## 2023-07-23 NOTE — Research (Signed)
A PHASE III TRIAL OF STEREOTACTIC RADIOSURGERY COMPARED WITH HIPPOCAMPAL-AVOIDANT WHOLE BRAIN RADIOTHERAPY (HA-WBRT) PLUS MEMANTINE FOR 5 OR MORE BRAIN METASTASES  [name] was identified by Dr Basilio Cairo as a potential candidate for the above listed study.  This Clinical Research Nurse met with ROSEZETTA SAMMUT, [id number], on 07/23/23 in a manner and location that ensures patient privacy to discuss participation in the above listed research study.  Patient is Accompanied by her friend .  A copy of the informed consent document and separate HIPAA Authorization was provided to the patient.  Patient reads, speaks, and understands [place].    Patient was provided with the business card of this Nurse and encouraged to contact the research team with any questions.  Patient was provided the option of taking informed consent documents home to review and was encouraged to review at their convenience with their support network, including other care providers. Patient is comfortable with making a decision regarding study participation today.  As outlined in the informed consent form, this Nurse and Trudee Grip discussed the purpose of the research study, the investigational nature of the study, study procedures and requirements for study participation, potential risks and benefits of study participation, as well as alternatives to participation. This study is not blinded. The patient understands participation is voluntary and they may withdraw from study participation at any time.  Each study arm was reviewed, and randomization discussed.  Potential side effects were reviewed with patient as outlined in the consent form, and patient made aware there may be side effects not yet known. This study does not involve a placebo. Patient understands enrollment is pending full eligibility review.   Confidentiality and how the patient's information will be used as part of study participation were discussed.   Patient was informed there is not reimbursement provided for their time and effort spent on trial participation.  The patient is encouraged to discuss research study participation with their insurance provider to determine what costs they may incur as part of study participation, including research related injury.    All questions were answered to patient's satisfaction.  The informed consent and separate HIPAA Authorization was reviewed page by page.  The patient's mental and emotional status is appropriate to provide informed consent, and the patient verbalizes an understanding of study participation.  Patient has agreed to participate in the above listed research study and has voluntarily signed the informed consent date 03/16/2022 and separate HIPAA Authorization, version 05/22/2023  on 07/23/23 at 1101AM.  The patient was provided with a copy of the signed informed consent form and separate HIPAA Authorization for their reference.  No study specific procedures were obtained prior to the signing of the informed consent document.  Approximately 30 minutes were spent with the patient reviewing the informed consent documents.  Patient was not requested to complete a Release of Information form.  Discussed plan for research team to confirm eligibility and for research team to reach out to her Thursday 07/25/23 to schedule labs and neurocognitive testing.  Margret Chance Ariyon Mittleman, RN, BSN, [facility] She  Her  Hers Clinical Research Nurse [facility] Direct Dial [phone] 07/23/2023 12:16 PM

## 2023-07-25 ENCOUNTER — Other Ambulatory Visit: Payer: Self-pay

## 2023-07-25 ENCOUNTER — Telehealth: Payer: Self-pay | Admitting: Radiation Therapy

## 2023-07-25 DIAGNOSIS — C3491 Malignant neoplasm of unspecified part of right bronchus or lung: Secondary | ICD-10-CM

## 2023-07-25 NOTE — Telephone Encounter (Signed)
I spoke with the patient to reschedule the 12/27 brain simulation appointment. We will not have the results of her trial randomization until around 4:00 on 12/27, this information is needed prior to sim for accurate set up.  [name] will still come in on 12/27 to meet with the research staff for registration into the trial and understands that we will update her on the arrival time for Monday 's SIM once we know which arm she has been randomized for. If SRS, we will use IV contrast for SIM, if HS whole brain we will not.   [name] R.T.(R)(T) Radiation Special Procedures Navigator

## 2023-07-25 NOTE — Research (Addendum)
LATE ENTRY:  A PHASE III TRIAL OF STEREOTACTIC RADIOSURGERY COMPARED WITH HIPPOCAMPAL-AVOIDANT WHOLE BRAIN RADIOTHERAPY (HA-WBRT) PLUS MEMANTINE FOR 5 OR MORE BRAIN METASTASES   During the patient's appointment today, I confirmed the following with patient and/or Dr Basilio Cairo:  Patient has had hysterectomy. No known allergy to gadolinium. No prior cranial radiation. No cytotoxic chemotherapy is planned within 48 hours before or after SRS or HA-WBRT. No widespread definitive leptomeningeal metastasis. No history of surgical resection of brain mets. No history of allergic reaction or hypersensitivity to memantine or its excipients. No current alcohol or drug abuse. No current use of NMDA antagonists. No diagnosis of liver disease. No clinically significant cardiovascular conditions or cardiac dysfunction. No current UTI. No history of epilepsy or seizures & not taking anti-epileptics. No other serious intercurrent illness or medical condition that would compromise patient safety, preclude safe administration of planned protocol treatment, or would not permit patient to be managed according to protocol guidelines. No architectural distortion of lateral ventricular systems.  No metastases within the brainstem.  I also reviewed patient's medication list with her to ensure that it is up to date.  Patient's eligibility will be reviewed by the research team prior to registration & randomization.  Molly Chance Elenora Hawbaker, RN, BSN, [facility] She  Her  Hers Clinical Research Nurse [facility] Direct Dial [phone] 07/25/2023 10:10 AM

## 2023-07-26 ENCOUNTER — Ambulatory Visit: Payer: Medicare Other

## 2023-07-26 ENCOUNTER — Ambulatory Visit: Payer: Medicare Other | Admitting: Radiation Oncology

## 2023-07-26 ENCOUNTER — Inpatient Hospital Stay: Payer: Medicare Other

## 2023-07-26 DIAGNOSIS — C3491 Malignant neoplasm of unspecified part of right bronchus or lung: Secondary | ICD-10-CM

## 2023-07-26 LAB — RESEARCH LABS

## 2023-07-26 NOTE — Research (Signed)
A PHASE III TRIAL OF STEREOTACTIC RADIOSURGERY COMPARED WITH HIPPOCAMPAL-AVOIDANT WHOLE BRAIN RADIOTHERAPY (HA-WBRT) PLUS MEMANTINE FOR 5 OR MORE BRAIN METASTASES  Patient was registered to step 0 prior to any study procedures.  She completed baseline PROs and neurocognitive assessments with research team members, then was escorted to lab by Adella Nissen.   Margret Chance Sincere Berlanga, RN, BSN, [facility] She  Her  Hers Clinical Research Nurse [facility] Direct Dial [phone] 07/26/2023 12:48 PM

## 2023-07-26 NOTE — Research (Signed)
A PHASE III TRIAL OF STEREOTACTIC RADIOSURGERY COMPARED WITH HIPPOCAMPAL-AVOIDANT WHOLE BRAIN RADIOTHERAPY (HA-WBRT) PLUS MEMANTINE FOR 5 OR MORE BRAIN METASTASES   This Nurse has reviewed this patient's inclusion and exclusion criteria and confirmed [name] is eligible for study participation.  Patient will continue with enrollment.  Menopausal status (women only): [name] is s/p hysterectomy.  Eligibility confirmed by treating investigator, who also agrees that patient should proceed with enrollment.  Medical history per Dr Colletta [place] 07/23/23 H&P.  Margret Chance Ajahni Nay, RN, BSN, [facility] She  Her  Hers Clinical Research Nurse [facility] Direct Dial [phone] 07/26/2023 4:32 PM

## 2023-07-26 NOTE — Research (Signed)
A PHASE III TRIAL OF STEREOTACTIC RADIOSURGERY COMPARED WITH HIPPOCAMPAL-AVOIDANT WHOLE BRAIN RADIOTHERAPY (HA-WBRT) PLUS MEMANTINE FOR 5 OR MORE BRAIN METASTASES  This Nurse has reviewed this patient's inclusion and exclusion criteria as a second review and confirms [name] is eligible for study participation.  Patient may continue with enrollment.  Domenica Reamer, BSN, RN, [employer] Research Nurse II [phone] 07/26/2023 4:35 PM

## 2023-07-29 ENCOUNTER — Ambulatory Visit
Admission: RE | Admit: 2023-07-29 | Discharge: 2023-07-29 | Disposition: A | Discharge status: Home / Self Care | Payer: Medicare Other | Source: Ambulatory Visit | Attending: Radiation Oncology | Admitting: Radiation Oncology

## 2023-07-29 ENCOUNTER — Other Ambulatory Visit: Payer: Self-pay | Admitting: Radiation Therapy

## 2023-07-29 ENCOUNTER — Telehealth: Payer: Self-pay

## 2023-07-29 ENCOUNTER — Other Ambulatory Visit: Payer: Self-pay

## 2023-07-29 VITALS — BP 126/75 | HR 62 | Temp 97.7°F | Resp 18 | Ht 67.0 in | Wt 194.8 lb

## 2023-07-29 DIAGNOSIS — C7931 Secondary malignant neoplasm of brain: Secondary | ICD-10-CM | POA: Diagnosis not present

## 2023-07-29 DIAGNOSIS — C3491 Malignant neoplasm of unspecified part of right bronchus or lung: Secondary | ICD-10-CM

## 2023-07-29 DIAGNOSIS — C3411 Malignant neoplasm of upper lobe, right bronchus or lung: Secondary | ICD-10-CM | POA: Diagnosis not present

## 2023-07-29 DIAGNOSIS — Z510 Encounter for antineoplastic radiation therapy: Secondary | ICD-10-CM | POA: Insufficient documentation

## 2023-07-29 NOTE — Telephone Encounter (Signed)
A PHASE III TRIAL OF STEREOTACTIC RADIOSURGERY COMPARED WITH HIPPOCAMPAL-AVOIDANT WHOLE BRAIN RADIOTHERAPY (HA-WBRT) PLUS MEMANTINE FOR 5 OR MORE BRAIN METASTASES  Reached out to patient to let her know a planning MRI is required before [facility] and that the scheduling team will be reaching out to discuss changes to appt dates tomorrow. Patient verbalized understanding.  Margret Chance Deone Leifheit, RN, BSN, [facility] She  Her  Hers Clinical Research Nurse [facility] Direct Dial [phone] 07/29/2023 5:10 PM

## 2023-07-29 NOTE — Addendum Note (Signed)
Encounter addended by: Benard Halsted, LPN on: 16/04/9603 10:44 AM  Actions taken: LDA properties accepted

## 2023-07-29 NOTE — Progress Notes (Signed)
Has armband been applied?  Yes.    Does patient have an allergy to IV contrast dye?: No. Patient does report having allergy to iodine when placed on skin. Denies ever having any reactions with IV contrast in the past.    Has patient ever received premedication for IV contrast dye?: No.   Date of lab work: July 22, 2023 BUN: 10  CR: 0.70 eGFR: >60  Does patient take metformin?: Yes.      IV site: antecubital left, condition patent and no redness  Has IV site been added to flowsheet?  Yes.    BP 126/75   Pulse 62   Temp 97.7 F (36.5 C)   Resp 18   Ht 5\' 7"  (1.702 m)   Wt 194 lb 12.8 oz (88.4 kg)   SpO2 99%   BMI 30.51 kg/m

## 2023-07-29 NOTE — Addendum Note (Signed)
Encounter addended by: Benard Halsted, LPN on: 16/04/9603 10:41 AM  Actions taken: LDA properties accepted

## 2023-07-30 ENCOUNTER — Other Ambulatory Visit: Payer: Self-pay | Admitting: Radiation Therapy

## 2023-07-30 ENCOUNTER — Encounter: Payer: Self-pay | Admitting: Internal Medicine

## 2023-07-30 ENCOUNTER — Ambulatory Visit (HOSPITAL_COMMUNITY)
Admission: RE | Admit: 2023-07-30 | Discharge: 2023-07-30 | Disposition: A | Discharge status: Home / Self Care | Payer: Medicare Other | Source: Ambulatory Visit | Attending: Radiation Oncology | Admitting: Radiation Oncology

## 2023-07-30 DIAGNOSIS — C7931 Secondary malignant neoplasm of brain: Secondary | ICD-10-CM | POA: Insufficient documentation

## 2023-07-30 DIAGNOSIS — C3491 Malignant neoplasm of unspecified part of right bronchus or lung: Secondary | ICD-10-CM

## 2023-07-30 MED ORDER — GADOBUTROL 1 MMOL/ML IV SOLN
8.5000 mL | Freq: Once | INTRAVENOUS | Status: AC | PRN
  Administered 2023-07-30: 8.5 mL via INTRAVENOUS

## 2023-07-30 NOTE — Research (Signed)
 A PHASE III TRIAL OF STEREOTACTIC RADIOSURGERY COMPARED WITH HIPPOCAMPAL-AVOIDANT WHOLE BRAIN RADIOTHERAPY (HA-WBRT) PLUS MEMANTINE  FOR 5 OR MORE BRAIN METASTASES  Documentation of baseline AEs   Event Grade Onset Date Resolved Date Drug Name Attribution Treatment Comments  Hyperglycemia Grade 2 Baseline   Unrelated Insulin  therapy   Gastroesophageal reflux disease Grade 2 Baseline   Unrelated On pantoprazole    Insomnia Grade 1 Baseline   Unrelated On trazodone    Depression Grade 1 Baseline   Unrelated On escitalopram     Molly Ezzell G. [name], RN, BSN, [facility] She  Her  Hers Clinical Research Nurse [facility] Direct Dial [phone] 07/30/2023 1:45 PM

## 2023-08-01 ENCOUNTER — Ambulatory Visit: Payer: Medicare Other | Admitting: Radiation Oncology

## 2023-08-01 ENCOUNTER — Telehealth: Payer: Self-pay | Admitting: Radiation Therapy

## 2023-08-01 DIAGNOSIS — C3491 Malignant neoplasm of unspecified part of right bronchus or lung: Secondary | ICD-10-CM

## 2023-08-01 LAB — GUARDANT 360

## 2023-08-01 NOTE — Research (Addendum)
 A PHASE III TRIAL OF STEREOTACTIC RADIOSURGERY COMPARED WITH HIPPOCAMPAL-AVOIDANT WHOLE BRAIN RADIOTHERAPY (HA-WBRT) PLUS MEMANTINE  FOR 5 OR MORE BRAIN METASTASES  Rec'd message from Dr Loyd team that planning MRI shows patient has more mets than expected, and patient is no longer a good candidate for research protocol treatment. Called [name]; she was aware of change already. Patient will be removed from the study. She was encouraged to call me with any questions/concerns related to research.  Andrea MORTON Naw Lasala, RN, BSN, [facility] She  Her  Hers Clinical Research Nurse [facility] Direct Dial [phone] 08/01/2023 9:14 AM

## 2023-08-01 NOTE — Telephone Encounter (Signed)
 I called to share the recent MRI results with [name] and the recommendation to change course from [facility] on the CE.7 trial to hippocampal avoidance whole brain off trial. I explained the rationale for the change and that she will now be coming in daily, starting on the afternoon of 1/6. [name] was happy for the call and thankful for Dr. Izell and the rest of the team changing the plan to best treat her.   Devere Perch R.T(R)(T) Radiation Special Procedures Lead

## 2023-08-02 ENCOUNTER — Ambulatory Visit (HOSPITAL_COMMUNITY)
Admission: RE | Admit: 2023-08-02 | Discharge: 2023-08-02 | Disposition: A | Discharge status: Home / Self Care | Payer: Medicare Other | Source: Ambulatory Visit | Attending: Internal Medicine | Admitting: Internal Medicine

## 2023-08-02 DIAGNOSIS — R590 Localized enlarged lymph nodes: Secondary | ICD-10-CM | POA: Diagnosis present

## 2023-08-02 DIAGNOSIS — C3490 Malignant neoplasm of unspecified part of unspecified bronchus or lung: Secondary | ICD-10-CM

## 2023-08-02 DIAGNOSIS — C773 Secondary and unspecified malignant neoplasm of axilla and upper limb lymph nodes: Secondary | ICD-10-CM | POA: Diagnosis not present

## 2023-08-02 DIAGNOSIS — Z85118 Personal history of other malignant neoplasm of bronchus and lung: Secondary | ICD-10-CM | POA: Insufficient documentation

## 2023-08-02 DIAGNOSIS — G939 Disorder of brain, unspecified: Secondary | ICD-10-CM | POA: Insufficient documentation

## 2023-08-02 DIAGNOSIS — Z01818 Encounter for other preprocedural examination: Secondary | ICD-10-CM

## 2023-08-02 MED ORDER — LIDOCAINE HCL 1 % IJ SOLN
INTRAMUSCULAR | Status: AC
  Filled 2023-08-02: qty 20

## 2023-08-02 NOTE — Procedures (Signed)
 Interventional Radiology Procedure Note  Procedure: US  Guided Biopsy of right axillary lymph node  Complications: None  Estimated Blood Loss: < 10 mL  Findings: 16 G core biopsy of 3.5 cm right axillary lymph node performed under US  guidance.  Four core samples obtained and sent to Pathology.  Marcey DASEN. Luverne, M.D Pager:  [phone]

## 2023-08-05 ENCOUNTER — Other Ambulatory Visit: Payer: Self-pay | Admitting: Radiation Oncology

## 2023-08-05 ENCOUNTER — Other Ambulatory Visit: Payer: Self-pay

## 2023-08-05 ENCOUNTER — Ambulatory Visit
Admission: RE | Admit: 2023-08-05 | Discharge: 2023-08-05 | Disposition: A | Discharge status: Home / Self Care | Payer: Medicare Other | Source: Ambulatory Visit | Attending: Radiation Oncology | Admitting: Radiation Oncology

## 2023-08-05 ENCOUNTER — Ambulatory Visit: Payer: Medicare Other

## 2023-08-05 DIAGNOSIS — C3411 Malignant neoplasm of upper lobe, right bronchus or lung: Secondary | ICD-10-CM | POA: Insufficient documentation

## 2023-08-05 DIAGNOSIS — C7951 Secondary malignant neoplasm of bone: Secondary | ICD-10-CM | POA: Insufficient documentation

## 2023-08-05 DIAGNOSIS — C7931 Secondary malignant neoplasm of brain: Secondary | ICD-10-CM | POA: Insufficient documentation

## 2023-08-05 DIAGNOSIS — R519 Headache, unspecified: Secondary | ICD-10-CM | POA: Diagnosis not present

## 2023-08-05 DIAGNOSIS — I10 Essential (primary) hypertension: Secondary | ICD-10-CM | POA: Diagnosis not present

## 2023-08-05 DIAGNOSIS — K219 Gastro-esophageal reflux disease without esophagitis: Secondary | ICD-10-CM | POA: Insufficient documentation

## 2023-08-05 DIAGNOSIS — Z7985 Long-term (current) use of injectable non-insulin antidiabetic drugs: Secondary | ICD-10-CM | POA: Diagnosis not present

## 2023-08-05 DIAGNOSIS — E785 Hyperlipidemia, unspecified: Secondary | ICD-10-CM | POA: Insufficient documentation

## 2023-08-05 DIAGNOSIS — Z5111 Encounter for antineoplastic chemotherapy: Secondary | ICD-10-CM | POA: Insufficient documentation

## 2023-08-05 DIAGNOSIS — R059 Cough, unspecified: Secondary | ICD-10-CM | POA: Insufficient documentation

## 2023-08-05 DIAGNOSIS — K5900 Constipation, unspecified: Secondary | ICD-10-CM | POA: Insufficient documentation

## 2023-08-05 DIAGNOSIS — Z860100 Personal history of colon polyps, unspecified: Secondary | ICD-10-CM | POA: Diagnosis not present

## 2023-08-05 DIAGNOSIS — Z791 Long term (current) use of non-steroidal anti-inflammatories (NSAID): Secondary | ICD-10-CM | POA: Insufficient documentation

## 2023-08-05 DIAGNOSIS — G8929 Other chronic pain: Secondary | ICD-10-CM | POA: Insufficient documentation

## 2023-08-05 DIAGNOSIS — Z79899 Other long term (current) drug therapy: Secondary | ICD-10-CM | POA: Insufficient documentation

## 2023-08-05 DIAGNOSIS — G4700 Insomnia, unspecified: Secondary | ICD-10-CM | POA: Diagnosis not present

## 2023-08-05 DIAGNOSIS — Z510 Encounter for antineoplastic radiation therapy: Secondary | ICD-10-CM | POA: Insufficient documentation

## 2023-08-05 DIAGNOSIS — Z794 Long term (current) use of insulin: Secondary | ICD-10-CM | POA: Diagnosis not present

## 2023-08-05 DIAGNOSIS — Z7984 Long term (current) use of oral hypoglycemic drugs: Secondary | ICD-10-CM | POA: Diagnosis not present

## 2023-08-05 DIAGNOSIS — E119 Type 2 diabetes mellitus without complications: Secondary | ICD-10-CM | POA: Diagnosis not present

## 2023-08-05 LAB — RAD ONC ARIA SESSION SUMMARY
Course Elapsed Days: 0
Plan Fractions Treated to Date: 1
Plan Prescribed Dose Per Fraction: 3 Gy
Plan Total Fractions Prescribed: 10
Plan Total Prescribed Dose: 30 Gy
Reference Point Dosage Given to Date: 3 Gy
Reference Point Session Dosage Given: 3 Gy
Session Number: 1

## 2023-08-05 MED ORDER — MEMANTINE HCL 28 X 5 MG & 21 X 10 MG PO TABS: ORAL_TABLET | ORAL | 12 refills | Status: DC

## 2023-08-06 ENCOUNTER — Other Ambulatory Visit: Payer: Self-pay

## 2023-08-06 ENCOUNTER — Inpatient Hospital Stay: Payer: Medicare Other | Attending: Internal Medicine | Admitting: Internal Medicine

## 2023-08-06 ENCOUNTER — Inpatient Hospital Stay: Payer: Medicare Other

## 2023-08-06 ENCOUNTER — Ambulatory Visit
Admission: RE | Admit: 2023-08-06 | Discharge: 2023-08-06 | Disposition: A | Discharge status: Home / Self Care | Payer: Medicare Other | Source: Ambulatory Visit | Attending: Radiation Oncology | Admitting: Radiation Oncology

## 2023-08-06 VITALS — BP 138/68 | HR 60 | Temp 97.8°F | Resp 17 | Ht 67.0 in | Wt 190.9 lb

## 2023-08-06 DIAGNOSIS — C3491 Malignant neoplasm of unspecified part of right bronchus or lung: Secondary | ICD-10-CM

## 2023-08-06 DIAGNOSIS — E119 Type 2 diabetes mellitus without complications: Secondary | ICD-10-CM | POA: Insufficient documentation

## 2023-08-06 DIAGNOSIS — Z791 Long term (current) use of non-steroidal anti-inflammatories (NSAID): Secondary | ICD-10-CM | POA: Insufficient documentation

## 2023-08-06 DIAGNOSIS — C3411 Malignant neoplasm of upper lobe, right bronchus or lung: Secondary | ICD-10-CM | POA: Insufficient documentation

## 2023-08-06 DIAGNOSIS — I10 Essential (primary) hypertension: Secondary | ICD-10-CM | POA: Insufficient documentation

## 2023-08-06 DIAGNOSIS — G4700 Insomnia, unspecified: Secondary | ICD-10-CM | POA: Insufficient documentation

## 2023-08-06 DIAGNOSIS — K5900 Constipation, unspecified: Secondary | ICD-10-CM | POA: Insufficient documentation

## 2023-08-06 DIAGNOSIS — Z5111 Encounter for antineoplastic chemotherapy: Secondary | ICD-10-CM | POA: Diagnosis not present

## 2023-08-06 DIAGNOSIS — Z7984 Long term (current) use of oral hypoglycemic drugs: Secondary | ICD-10-CM | POA: Insufficient documentation

## 2023-08-06 DIAGNOSIS — R519 Headache, unspecified: Secondary | ICD-10-CM | POA: Insufficient documentation

## 2023-08-06 DIAGNOSIS — K219 Gastro-esophageal reflux disease without esophagitis: Secondary | ICD-10-CM | POA: Insufficient documentation

## 2023-08-06 DIAGNOSIS — E785 Hyperlipidemia, unspecified: Secondary | ICD-10-CM | POA: Insufficient documentation

## 2023-08-06 DIAGNOSIS — Z7985 Long-term (current) use of injectable non-insulin antidiabetic drugs: Secondary | ICD-10-CM | POA: Insufficient documentation

## 2023-08-06 DIAGNOSIS — G8929 Other chronic pain: Secondary | ICD-10-CM | POA: Insufficient documentation

## 2023-08-06 DIAGNOSIS — Z794 Long term (current) use of insulin: Secondary | ICD-10-CM | POA: Insufficient documentation

## 2023-08-06 DIAGNOSIS — Z79899 Other long term (current) drug therapy: Secondary | ICD-10-CM | POA: Insufficient documentation

## 2023-08-06 DIAGNOSIS — R059 Cough, unspecified: Secondary | ICD-10-CM | POA: Insufficient documentation

## 2023-08-06 DIAGNOSIS — C7951 Secondary malignant neoplasm of bone: Secondary | ICD-10-CM | POA: Insufficient documentation

## 2023-08-06 DIAGNOSIS — C7931 Secondary malignant neoplasm of brain: Secondary | ICD-10-CM | POA: Insufficient documentation

## 2023-08-06 DIAGNOSIS — Z860100 Personal history of colon polyps, unspecified: Secondary | ICD-10-CM | POA: Insufficient documentation

## 2023-08-06 LAB — CBC WITH DIFFERENTIAL (CANCER CENTER ONLY)
Abs Immature Granulocytes: 0.02 10*3/uL (ref 0.00–0.07)
Basophils Absolute: 0.1 10*3/uL (ref 0.0–0.1)
Basophils Relative: 1 %
Eosinophils Absolute: 0.3 10*3/uL (ref 0.0–0.5)
Eosinophils Relative: 4 %
HCT: 37.6 % (ref 36.0–46.0)
Hemoglobin: 12.3 g/dL (ref 12.0–15.0)
Immature Granulocytes: 0 %
Lymphocytes Relative: 18 %
Lymphs Abs: 1.5 10*3/uL (ref 0.7–4.0)
MCH: 24.9 pg — ABNORMAL LOW (ref 26.0–34.0)
MCHC: 32.7 g/dL (ref 30.0–36.0)
MCV: 76.1 fL — ABNORMAL LOW (ref 80.0–100.0)
Monocytes Absolute: 0.4 10*3/uL (ref 0.1–1.0)
Monocytes Relative: 4 %
Neutro Abs: 6 10*3/uL (ref 1.7–7.7)
Neutrophils Relative %: 73 %
Platelet Count: 306 10*3/uL (ref 150–400)
RBC: 4.94 MIL/uL (ref 3.87–5.11)
RDW: 12.7 % (ref 11.5–15.5)
WBC Count: 8.2 10*3/uL (ref 4.0–10.5)
nRBC: 0 % (ref 0.0–0.2)

## 2023-08-06 LAB — RAD ONC ARIA SESSION SUMMARY
Course Elapsed Days: 1
Plan Fractions Treated to Date: 2
Plan Prescribed Dose Per Fraction: 3 Gy
Plan Total Fractions Prescribed: 10
Plan Total Prescribed Dose: 30 Gy
Reference Point Dosage Given to Date: 6 Gy
Reference Point Session Dosage Given: 3 Gy
Session Number: 2

## 2023-08-06 LAB — CMP (CANCER CENTER ONLY)
ALT: 10 U/L (ref 0–44)
AST: 12 U/L — ABNORMAL LOW (ref 15–41)
Albumin: 4.1 g/dL (ref 3.5–5.0)
Alkaline Phosphatase: 72 U/L (ref 38–126)
Anion gap: 6 (ref 5–15)
BUN: 12 mg/dL (ref 8–23)
CO2: 31 mmol/L (ref 22–32)
Calcium: 9.7 mg/dL (ref 8.9–10.3)
Chloride: 102 mmol/L (ref 98–111)
Creatinine: 0.77 mg/dL (ref 0.44–1.00)
GFR, Estimated: >60 mL/min (ref >60)
Glucose, Bld: 159 mg/dL — ABNORMAL HIGH (ref 70–99)
Potassium: 4 mmol/L (ref 3.5–5.1)
Sodium: 139 mmol/L (ref 135–145)
Total Bilirubin: 0.6 mg/dL (ref 0.0–1.2)
Total Protein: 7 g/dL (ref 6.5–8.1)

## 2023-08-06 LAB — SURGICAL PATHOLOGY

## 2023-08-06 MED ORDER — CYANOCOBALAMIN 1000 MCG/ML IJ SOLN
1000.0000 ug | Freq: Once | INTRAMUSCULAR | Status: AC
Start: 2023-08-06 — End: 2023-08-06
  Administered 2023-08-06: 1000 ug via INTRAMUSCULAR
  Filled 2023-08-06: qty 1

## 2023-08-06 MED ORDER — LIDOCAINE-PRILOCAINE 2.5-2.5 % EX CREA: TOPICAL_CREAM | CUTANEOUS | 3 refills | Status: DC

## 2023-08-06 MED ORDER — FOLIC ACID 1 MG PO TABS: 1.0000 mg | ORAL_TABLET | Freq: Every day | ORAL | 3 refills | Status: DC

## 2023-08-06 MED ORDER — ONDANSETRON HCL 8 MG PO TABS: 8.0000 mg | ORAL_TABLET | Freq: Three times a day (TID) | ORAL | 1 refills | Status: DC | PRN

## 2023-08-06 MED ORDER — PROCHLORPERAZINE MALEATE 10 MG PO TABS: 10.0000 mg | ORAL_TABLET | Freq: Four times a day (QID) | ORAL | 1 refills | Status: DC | PRN

## 2023-08-06 NOTE — Progress Notes (Signed)
START ON PATHWAY REGIMEN - Non-Small Cell Lung     A cycle is every 21 days:     Pembrolizumab      Pemetrexed      Carboplatin   **Always confirm dose/schedule in your pharmacy ordering system**  Patient Characteristics: Stage IV Metastatic, Nonsquamous, Molecular Analysis Completed, Molecular Alteration Present and Targeted Therapy Exhausted OR KRAS G12C+ or HER2+ Present and No Prior Chemo/Immunotherapy OR No Alteration Present, Initial Chemotherapy/Immunotherapy, PS =  0, 1, BRAF/MET/KRAS/HER2 Mutation Positive, Candidate for Immunotherapy, PD-L1 Expression Positive 1-49% (TPS) / Negative / Not Tested / Awaiting Test Results and Immunotherapy Candidate Therapeutic Status: Stage IV Metastatic Histology: Nonsquamous Cell Broad Molecular Profiling Status: Molecular Analysis Completed Molecular Analysis Results: KRAS G12C Mutation Present and No Prior Chemo/Immunotherapy ECOG Performance Status: 1 Chemotherapy/Immunotherapy Line of Therapy: Initial Chemotherapy/Immunotherapy Immunotherapy Candidate Status: Candidate for Immunotherapy PD-L1 Expression Status: Awaiting Test Results Intent of Therapy: Non-Curative / Palliative Intent, Discussed with Patient

## 2023-08-06 NOTE — Research (Signed)
 Trial: DCP-001: Use of a Clinical Trial Screening Tool to Address Cancer Health Disparities in the [facility] [facility] Research Program [facility]  [name] was identified by this RN as a potential candidate for the above listed study.  This Clinical Research Nurse met with [name], [id number], on 08/06/23 in a manner and location that ensures patient privacy to discuss participation in the above listed research study.  Patient is Accompanied by friends .  A copy of the informed consent document and separate HIPAA Authorization was provided to the patient.  Patient reads, speaks, and understands [place].    Patient was provided with the business card of this Nurse and encouraged to contact the research team with any questions.  Patient was provided the option of taking informed consent documents home to review and was encouraged to review at their convenience with their support network, including other care providers. Patient is comfortable with making a decision regarding study participation today.  As outlined in the informed consent form, this Nurse and Pina C Caudill discussed the purpose of the research study, the investigational nature of the study, study procedures and requirements for study participation, potential risks and benefits of study participation, as well as alternatives to participation. This study is not blinded. The patient understands participation is voluntary and they may withdraw from study participation at any time.  This study does not involve randomization.  This study does not involve an investigational drug or device. This study does not involve a placebo. Patient understands enrollment is pending full eligibility review.   Confidentiality and how the patient's information will be used as part of study participation were discussed.  Patient was informed there is not reimbursement provided for their time and effort spent on trial participation.  The  patient is encouraged to discuss research study participation with their insurance provider to determine what costs they may incur as part of study participation, including research related injury.    All questions were answered to patient's satisfaction.  The informed consent and separate HIPAA Authorization was reviewed page by page.  The patient's mental and emotional status is appropriate to provide informed consent, and the patient verbalizes an understanding of study participation.  Patient has agreed to participate in the above listed research study and has voluntarily signed the informed consent active date 03/16/2022 and separate HIPAA Authorization, version 15  on 08/06/23 at 0812AM.  The patient was provided with a copy of the signed informed consent form and separate HIPAA Authorization for their reference.  No study specific procedures were obtained prior to the signing of the informed consent document.  Approximately 15 minutes were spent with the patient reviewing the informed consent documents.  Patient was not requested to complete a Release of Information form.  Patient answered DCP questions. She is hopeful about her radiation and her visit with Dr Sherrod today.  Andrea MORTON Kriston Pasquarello, RN, BSN, [facility] She  Her  Hers Clinical Research Nurse [facility] Direct Dial [phone] 08/06/2023 9:22 AM

## 2023-08-06 NOTE — Progress Notes (Signed)
 [facility] [phone]   [phone]  OFFICE PROGRESS NOTE  Koirala, Dibas, MD [address] [place] [address]  DIAGNOSIS:  1) recurrent/metastatic non-small cell lung cancer, adenocarcinoma presented with metastatic adenopathy in the thorax as well as mediastinum, axillary lymph nodes and potential local recurrence at the right hilum along the inferior margin of the resection staple line with bilateral hypermetabolic adrenal metastasis and hypermetabolic bone metastasis to the T11 vertebral body as well as brain metastasis diagnosed in December 2024.  2) Stage IA (T1a, N0, M0) non-small cell lung cancer, adenocarcinoma presented with right upper lobe pulmonary nodule   Detected Alteration(s) / Biomarker(s) Associated FDA-approved therapies Clinical Trial Availability % cfDNA or Amplification KRAS G12C approved by FDA Adagrasib , Sotorasib approved in other indication Adagrasib +cetuximab Yes 2.8%  TP53 E204* None Yes 2.0%   PRIOR THERAPY: S/P right VATS with right upper lobectomy and mediastinal lymph node dissection under the care of Dr. Kerrin on October 15, 2016.  CURRENT THERAPY: Systemic chemotherapy with carboplatin  for AUC of 5, Alimta  500 Mg/M2 and Keytruda  200 Mg IV every 3 weeks.  First dose August 19, 2022.  INTERVAL HISTORY: [name] 68 y.o. female returns to the clinic today for annual follow-up visit accompanied by one of her neighbor. Discussed the use of AI scribe software for clinical note transcription with the patient, who gave verbal consent to proceed.  History of Present Illness   The patient, a 68 year old individual with a history of stage 1A non-small cell lung cancer adenocarcinoma, initially diagnosed in March 2018, underwent a right upper lobe resection. She was on observation until a recent PET scan revealed metastatic disease. A biopsy confirmed the recurrence of adenocarcinoma, now  technically classified as stage 4. Molecular studies revealed a KRAS G12C mutation in the tumor.  After the first session, she experienced a severe headache. No other symptoms or complaints were reported.  The patient maintains a positive attitude towards her treatment plan, expressing readiness to fight the disease.       MEDICAL HISTORY: Past Medical History:  Diagnosis Date   Adenocarcinoma of right lung, stage 1 (HCC) 11/29/2016   Cancer (HCC)    Chronic pain    Depression    takes Lexapro  daily   Diabetes mellitus    takes Metformin  daily and has an insulin  pump.Fasting blood sugar runs250   GERD (gastroesophageal reflux disease)    takes Pantoprazole  daily   History of colon polyps    benign   Hyperlipidemia    takes Atorvastatin  daily   Hypertension    takes Amlodipine  and Lisinopril  daily   Insomnia    takes Trazodone  nightly   Nocturia    Thyroid  disease    Urinary frequency    Urinary urgency     ALLERGIES:  is allergic to synthroid [levothyroxine sodium], hydrochlorothiazide, and iodine.  MEDICATIONS:  Current Outpatient Medications  Medication Sig Dispense Refill   ACCU-CHEK AVIVA PLUS test strip USE TO MONITOR GLUCOSE LEVELS TWICE A DAY E11.9 100 strip 12   ACCU-CHEK SOFTCLIX LANCETS lancets Use to monitor glucose levels BID; E11.9 (Patient taking differently: 1 each by Other route 2 (two) times daily. E11.9) 100 each 12   acetaminophen  (TYLENOL ) 500 MG tablet Take 1 tablet (500 mg total) by mouth every 6 (six) hours as needed. (Patient taking differently: Take 500 mg by mouth every 6 (six) hours as needed (for pain).) 30 tablet 0   atorvastatin  (LIPITOR)  20 MG tablet Take 20 mg by mouth daily.  11   Blood Glucose Calibration (GLUCOSE CONTROL) SOLN 1 Bottle by In Vitro route as needed. Use to calibrate Accu-Chek Aviva Plus device prn; please provide control solution compatible with Accu-Chek Aviva Plus device. (Patient taking differently: 1 each by Other route as  needed (Use to calibrate glucometer). E11.9) 1 each 1   Blood Glucose Monitoring Suppl (ACCU-CHEK AVIVA PLUS) w/Device KIT 1 each by Does not apply route 2 (two) times daily. Use to monitor glucose levels BID; E11.9 (Patient taking differently: 1 each by Does not apply route 2 (two) times daily. E11.9) 1 kit 0   chlorthalidone  (HYGROTON ) 25 MG tablet Take 12.5 mg by mouth daily.      clobetasol  cream (TEMOVATE ) 0.05 % Apply 1 Application topically 2 (two) times daily as needed (itching).     cyclobenzaprine  (FLEXERIL ) 10 MG tablet TAKE 1 TABLET BY MOUTH TWICE A DAY AS NEEDED FOR MUSCLE SPASMS     dexamethasone  (DECADRON ) 4 MG tablet Take 1 tablet (4 mg total) by mouth 3 (three) times daily. 30 tablet 0   diclofenac  Sodium (VOLTAREN  ARTHRITIS PAIN) 1 % GEL Apply 4 g topically 4 (four) times daily. (Patient taking differently: Apply 4 g topically 3 (three) times daily as needed (joint pain).) 150 g 0   escitalopram  (LEXAPRO ) 10 MG tablet Take 1 tablet (10 mg total) by mouth every morning. 90 tablet 3   ibuprofen  (ADVIL ) 800 MG tablet Take 800 mg by mouth every 8 (eight) hours as needed for moderate pain.     insulin  glargine (LANTUS  SOLOSTAR) 100 UNIT/ML Solostar Pen Inject 15 Units into the skin at bedtime. 15 mL 3   Insulin  Pen Needle (B-D ULTRAFINE III SHORT PEN) 31G X 8 MM MISC USE AS DIRECTED 100 each 5   lisinopril  (ZESTRIL ) 10 MG tablet Take 10 mg by mouth daily.     meloxicam (MOBIC) 7.5 MG tablet Take 7.5 mg by mouth daily.     memantine  (NAMENDA  TITRATION PACK) tablet pack 5 mg/day for =1 week; 5 mg twice daily for =1 week; 15 mg/day given in 5 mg and 10 mg separated doses for =1 week; then 10 mg twice daily. Take through 01/20/2024, then stop. 49 tablet 12   metFORMIN  (GLUCOPHAGE -XR) 500 MG 24 hr tablet Take 1 tablet (500 mg total) by mouth daily with breakfast. 90 tablet 3   pantoprazole  (PROTONIX ) 40 MG tablet Take 1 tablet (40 mg total) by mouth 2 (two) times daily. 180 tablet 3    tirzepatide  (MOUNJARO ) 15 MG/0.5ML Pen Inject 15 mg into the skin once a week. 6 mL 3   traZODone  (DESYREL ) 100 MG tablet Take 2 tablets (200 mg total) by mouth at bedtime. 180 tablet 3   triamcinolone  cream (KENALOG ) 0.1 % Apply 1 application topically 2 (two) times daily. (Patient taking differently: Apply 1 application  topically 2 (two) times daily as needed (irritation).) 30 g 0   No current facility-administered medications for this visit.    SURGICAL HISTORY:  Past Surgical History:  Procedure Laterality Date   ABDOMINAL HYSTERECTOMY     ABDOMINAL SURGERY     BIOPSY  05/28/2019   Procedure: BIOPSY;  Surgeon: Harvey Margo CROME, MD;  [facility]: [facility];  Service: Endoscopy;;  random colon/gastric/duodenum   BIOPSY  01/11/2023   Procedure: BIOPSY;  Surgeon: Cindie Carlin POUR, DO;  Location: [facility];  Service: Endoscopy;;   COLONOSCOPY  2005 [facility]   TICs, IH  COLONOSCOPY N/A 03/08/2014   Procedure: COLONOSCOPY;  Surgeon: Margo LITTIE Haddock, MD;  Location: [facility];  Service: Endoscopy;  Laterality: N/A;  1015   COLONOSCOPY WITH PROPOFOL  N/A 05/28/2019   Procedure: COLONOSCOPY WITH PROPOFOL ;  Surgeon: Haddock Margo LITTIE, MD;  [facility]: [facility];  Service: Endoscopy;  Laterality: N/A;  9:15am   CYSTOSTOMY  11/22/2011   Procedure: CYSTOSTOMY SUPRAPUBIC;  Surgeon: Emery LILLETTE Blaze, MD;  Location: [facility];  Service: Urology;  Laterality: N/A;   ESOPHAGOGASTRODUODENOSCOPY N/A 03/08/2014   Procedure: ESOPHAGOGASTRODUODENOSCOPY (EGD);  Surgeon: Margo LITTIE Haddock, MD;  [facility]: [facility];  Service: Endoscopy;  Laterality: N/A;   ESOPHAGOGASTRODUODENOSCOPY     ESOPHAGOGASTRODUODENOSCOPY (EGD) WITH PROPOFOL  N/A 05/28/2019   Procedure: ESOPHAGOGASTRODUODENOSCOPY (EGD) WITH PROPOFOL ;  Surgeon: Haddock Margo LITTIE, MD;  [facility];  Service: Endoscopy;  Laterality: N/A;   ESOPHAGOGASTRODUODENOSCOPY (EGD) WITH PROPOFOL  N/A 01/11/2023   Procedure: ESOPHAGOGASTRODUODENOSCOPY (EGD)  WITH PROPOFOL ;  Surgeon: Cindie Carlin POUR, DO;  [facility]: [facility];  Service: Endoscopy;  Laterality: N/A;  9:45 am, asa 3   GIVENS CAPSULE STUDY N/A 06/15/2019   Procedure: GIVENS CAPSULE STUDY;  Surgeon: Haddock Margo LITTIE, MD;  [facility]: [facility];  Service: Endoscopy;  Laterality: N/A;  7:30am   HALLUX VALGUS CORRECTION     bilateral foot surgery for bone repairs-multiple   LOBECTOMY Right 10/15/2016   Procedure: RIGHT UPPER LOBECTOMY;  Surgeon: Elspeth JAYSON Millers, MD;  Location: [facility];  Service: Thoracic;  Laterality: Right;   LYMPH NODE DISSECTION Right 10/15/2016   Procedure: LYMPH NODE DISSECTION;  Surgeon: Elspeth JAYSON Millers, MD;  Location: [facility] OR;  Service: Thoracic;  Laterality: Right;   POLYPECTOMY  05/28/2019   Procedure: POLYPECTOMY;  Surgeon: Haddock Margo LITTIE, MD;  [facility];  Service: Endoscopy;;   SAVORY DILATION N/A 05/28/2019   Procedure: SAVORY DILATION;  Surgeon: Haddock Margo LITTIE, MD;  [facility];  Service: Endoscopy;  Laterality: N/A;  15/16/17   VAGINA RECONSTRUCTION SURGERY     tvt 2006- [facility]    VIDEO ASSISTED THORACOSCOPY (VATS)/WEDGE RESECTION Right 10/15/2016   Procedure: RIGHT VIDEO ASSISTED THORACOSCOPY (VATS)/WEDGE RESECTION;  Surgeon: Elspeth JAYSON Millers, MD;  Location: [facility];  Service: Thoracic;  Laterality: Right;    REVIEW OF SYSTEMS:  Constitutional: positive for fatigue and weight loss Eyes: negative Ears, nose, mouth, throat, and face: negative Respiratory: negative Cardiovascular: negative Gastrointestinal: negative Genitourinary:negative Integument/breast: negative Hematologic/lymphatic: negative Musculoskeletal:negative Neurological: positive for headaches Behavioral/Psych: negative Endocrine: negative Allergic/Immunologic: negative   PHYSICAL EXAMINATION: General appearance: alert, cooperative, and no distress Head: Normocephalic, without obvious abnormality, atraumatic Neck: no adenopathy, no JVD,  supple, symmetrical, trachea midline, and thyroid  not enlarged, symmetric, no tenderness/mass/nodules Lymph nodes: Cervical, supraclavicular, and axillary nodes normal. Resp: clear to auscultation bilaterally Back: symmetric, no curvature. ROM normal. No CVA tenderness. Cardio: regular rate and rhythm, S1, S2 normal, no murmur, click, rub or gallop GI: soft, non-tender; bowel sounds normal; no masses,  no organomegaly Extremities: extremities normal, atraumatic, no cyanosis or edema Neurologic: Alert and oriented X 3, normal strength and tone. Normal symmetric reflexes. Normal coordination and gait  ECOG PERFORMANCE STATUS: 0 - Asymptomatic  Blood pressure 138/68, pulse 60, temperature 97.8 F (36.6 C), temperature source Temporal, resp. rate 17, height 5' 7 (1.702 m), weight 190 lb 14.4 oz (86.6 kg), SpO2 100%.  LABORATORY DATA: Lab Results  Component Value Date   WBC 8.2 08/06/2023   HGB 12.3 08/06/2023   HCT  37.6 08/06/2023   MCV 76.1 (L) 08/06/2023   PLT 306 08/06/2023      Chemistry      Component Value Date/Time   NA 139 08/06/2023 0801   NA 142 10/29/2022 1158   NA 141 05/29/2017 0921   K 4.0 08/06/2023 0801   K 4.8 05/29/2017 0921   CL 102 08/06/2023 0801   CO2 31 08/06/2023 0801   CO2 28 05/29/2017 0921   BUN 12 08/06/2023 0801   BUN 21 10/29/2022 1158   BUN 13.5 05/29/2017 0921   CREATININE 0.77 08/06/2023 0801   CREATININE 0.8 05/29/2017 0921      Component Value Date/Time   CALCIUM  9.7 08/06/2023 0801   CALCIUM  9.5 05/29/2017 0921   ALKPHOS 72 08/06/2023 0801   ALKPHOS 104 05/29/2017 0921   AST 12 (L) 08/06/2023 0801   AST 17 05/29/2017 0921   ALT 10 08/06/2023 0801   ALT 20 05/29/2017 0921   BILITOT 0.6 08/06/2023 0801   BILITOT 0.40 05/29/2017 0921       RADIOGRAPHIC STUDIES: US  CORE BIOPSY (LYMPH NODES) Result Date: 08/02/2023 INDICATION: History of lung carcinoma and bilateral hypermetabolic axillary lymphadenopathy by PET scan. EXAM:  ULTRASOUND GUIDED CORE BIOPSY OF RIGHT AXILLARY LYMPH NODE MEDICATIONS: None. ANESTHESIA/SEDATION: None PROCEDURE: The procedure, risks, benefits, and alternatives were explained to the patient. Questions regarding the procedure were encouraged and answered. The patient understands and consents to the procedure. A time-out was performed prior to initiating the procedure. The right axilla was prepped with chlorhexidine  in a sterile fashion, and a sterile drape was applied covering the operative field. A sterile gown and sterile gloves were used for the procedure. Local anesthesia was provided with 1% Lidocaine . After localizing an enlarged right axillary lymph node, 4 separate 16 gauge core biopsy samples were obtained in different portions of the lymph node under ultrasound guidance. Core biopsy samples were submitted in saline. Additional ultrasound was performed. COMPLICATIONS: None immediate. FINDINGS: Enlarged right axillary lymph node measures up to approximately 3.5 x 2.3 x 1.8 cm. Solid core biopsy samples were obtained. IMPRESSION: Ultrasound-guided core biopsy performed of an enlarged right axillary lymph node. This corresponds to 1 of the hypermetabolic lymph nodes by PET scan. Electronically Signed   By: Marcey Moan M.D.   On: 08/02/2023 15:18   MR Brain W Wo Contrast Result Date: 07/30/2023 CLINICAL DATA:  Metastatic disease evaluation 3T SRS Protocol for radiaiton treatment planning EXAM: MRI HEAD WITHOUT AND WITH CONTRAST TECHNIQUE: Multiplanar, multiecho pulse sequences of the brain and surrounding structures were obtained without and with intravenous contrast. CONTRAST:  8.5mL GADAVIST  GADOBUTROL  1 MMOL/ML IV SOLN COMPARISON:  None Available. FINDINGS: Brain: Many of the previously seen enhancing lesions are slightly increased in size. For example, index left posterior frontal lesion measures 8 x 8 mm on series 1100 image 181 (previously 7 x 6 mm). In addition, there are numerous additional  punctate enhancing metastasis which were not well characterized on the prior, which could reflect new metastases or metastases that are better characterized due to improved 3 T technique. There are over 50 lesions identified and annotated on series 1100. Edema surrounding some of these lesions without significant mass effect or midline shift. No evidence of acute infarct, acute hemorrhage, or hydrocephalus. Vascular: Major arterial flow voids are maintained at the skull base. Skull and upper cervical spine: Normal marrow signal. Sinuses/Orbits: Clear sinuses.  No acute orbital findings. Other: No mastoid effusions. IMPRESSION: 1. Slightly increased size of the previously identified  metastases with numerous punctate enhancing metastases (over 50) that could be new or visible given improved technique (annotated on series 1100). 2. Edema surrounding some of these lesions without mass effect or midline shift. Electronically Signed   By: Gilmore GORMAN Molt M.D.   On: 07/30/2023 20:27   NM PET Image Restage (PS) Skull Base to Thigh (F-18 FDG) Result Date: 07/21/2023 CLINICAL DATA:  Subsequent treatment strategy for non-small cell lung cancer. EXAM: NUCLEAR MEDICINE PET SKULL BASE TO THIGH TECHNIQUE: 10.2 mCi F-18 FDG was injected intravenously. Full-ring PET imaging was performed from the skull base to thigh after the radiotracer. CT data was obtained and used for attenuation correction and anatomic localization. Fasting blood glucose: 95 mg/dl COMPARISON:  CT 88/85/7975 FINDINGS: NECK: No hypermetabolic lymph nodes in the neck. Incidental CT findings: None. CHEST: Intensely hypermetabolic LEFT and RIGHT supraclavicular/thoracic inlet nodes. Lymph node adjacent to the LEFT lobe of thyroid  gland with SUV max equal 6.2 measures 17 mm on image 46. Bilateral hypermetabolic axillary lymph nodes. Example RIGHT axial lymph node measures 22 mm on image 53 with SUV max equal 13.4. Enlarged paratracheal and subcarinal nodes  which are intensely hypermetabolic. For example subcarinal node measuring 20 mm short axis with SUV max equal 12.8. Intensely hypermetabolic RIGHT hilar lymph node. Postsurgical change in the RIGHT lung consistent resection. no discrete pulmonary nodules. There is metabolic activity in the RIGHT hilum at the inferior margin of the staple line on image 67. Incidental CT findings: None. ABDOMEN/PELVIS: Bilateral hypermetabolic adrenal glands Example LEFT gland measures 18 mm with SUV max equal 8.1. Hypermetabolic lymph node along the LEFT external iliac artery measures 14 mm SUV max equal 11.9 on image 165. No liver metastasis. Incidental CT findings: None. SKELETON: Hypermetabolic lesion in the T 11 vertebral bodies SUV max 9.2 on image 101. This is corresponding periphery sclerotic centrally lytic lesion measuring 12 mm on image 101. Incidental CT findings: None. IMPRESSION: 1. Extensive hypermetabolic metastatic adenopathy in the thorax involving the thoracic inlet, mediastinum, and axillary nodes. 2. Potential local recurrence at the RIGHT hilum along inferior margin of the resection staple line. 3. Bilateral hypermetabolic adrenal metastatic lesions. 4. Hypermetabolic metastatic lesion in the T11 vertebral body. . Electronically Signed   By: Jackquline Boxer M.D.   On: 07/21/2023 12:10   MR BRAIN W WO CONTRAST Result Date: 07/19/2023 CLINICAL DATA:  Non-small cell lung cancer, staging EXAM: MRI HEAD WITHOUT AND WITH CONTRAST TECHNIQUE: Multiplanar, multiecho pulse sequences of the brain and surrounding structures were obtained without and with intravenous contrast. CONTRAST:  9mL GADAVIST  GADOBUTROL  1 MMOL/ML IV SOLN COMPARISON:  06/11/2027 FINDINGS: Brain: Multiple enhancing lesions in the brain parenchyma. Left posterior parafalcine frontal lobe, 6 x 3 x 8 mm (series 16, image 142). Right anterior frontal lobe, 10 x 12 x 12 mm (series 16, image 139). Left lateral frontal lobe, 8 x 6 x 7 mm, (series 16,  image 136). Anteromedial left frontal lobe, 2 mm (series 16, image 127). Anterior right frontal lobe, punctate (series 16, image 123). Anterior, medial right frontal lobe, 4 x 4 x 3 mm (series 16, image 123). Medial left parietal lobe, 7 x 6 x 6 mm (series 16, image 123). Left thalamus, 3 mm (series 16, image 99). Right thalamus/internal capsule, 7 x 6 x 6 mm (series 16, image 97). Right posterior temporal lobe, 5 x 5 x 6 mm (series 16, image 91). Right occipital lobe, 6 x 5 x 5 mm (series 16, image 85). Left anterolateral temporal  lobe, 5 x 2 x 3 mm (series 16, image 65). Right cerebellum, 6 x 5 x 5 mm (series 16, image 45). Left cerebellum 8 x 9 x 7 mm (series 16, image 43). The larger lesions are associated with increased T2 hyperintense signal, likely edema. No evidence of acute infarct, hemorrhage, mass, mass effect, or midline shift. No hydrocephalus or extra-axial collection. Pituitary and craniocervical junction within normal limits. Vascular: Normal arterial flow voids. Normal arterial and venous enhancement. Skull and upper cervical spine: Normal marrow signal. Sinuses/Orbits: Mild mucosal thickening in the ethmoid air cells and maxillary sinuses. No acute finding in the orbits. Other: Trace fluid in the right mastoid air cells. IMPRESSION: Multiple enhancing lesions in the brain parenchyma, as described above, consistent with metastatic disease. The larger lesions are associated with increased T2 hyperintense signal, likely edema. No significant mass effect or midline shift. Electronically Signed   By: Donald Campion M.D.   On: 07/19/2023 19:33     ASSESSMENT AND PLAN: This is a very pleasant 68 years old white female with stage IV (T1a, N3, M1 C) non-small cell lung cancer initially diagnosed as stage IA non-small cell lung cancer status post right upper lobectomy with lymph node dissection.  This was diagnosed in March 2018 with evidence for disease recurrence and metastasis in December 2024. The  patient has been on observation and she is feeling fine today with no concerning complaints. The patient had repeat CT scan of the chest performed recently.  I personally independently reviewed the scan images and discussed the results with the patient today.  Unfortunately her scan showed interval development of several abnormal lymph nodes throughout the chest including right hilar, mediastinal, supraclavicular and bilateral axillary as well as upper abdominal lymphadenopathy.  There was also increasing right adrenal nodule worrisome for adrenal metastasis. She underwent ultrasound-guided core biopsy of right axillary lymph node and the final pathology was consistent with metastatic adenocarcinoma of lung primary.  We will send the tissue block to [facility] for molecular studies and PD-L1 expression.  Molecular studies by [email or web address] showed positive KRAS G12C mutation.  Stage IV Non-Small Cell Lung Cancer (NSCLC) Adenocarcinoma Initially diagnosed with stage IA NSCLC adenocarcinoma in March 2018, underwent right upper lobe resection. Recent CT and PET scans revealed metastatic disease, confirmed by biopsy as adenocarcinoma with KRAS G12C mutation. Currently undergoing radiation therapy for brain metastases, experiencing significant post-radiation headaches. Treatment plan includes palliative systemic chemotherapy and immunotherapy. Discussed risks (nausea, vomiting, fatigue, blood count drops) and benefits (potential tumor control, symptom relief). Alternative is palliative care on hospice. Prefers active treatment. - Administer carboplatin  (AUC of 5), Alimta  (500 mg/m), and Keytruda  (200 mg IV) every three weeks for four cycles - Continue Alimta  and Keytruda  every three weeks post four cycles - Monitor CBC, kidney and liver function, and electrolytes weekly for 12 weeks - Perform lab tests on treatment days post initial 12 weeks - Start chemotherapy in two weeks post-radiation - Provide  chemotherapy education class - Insert port for chemotherapy administration - Prescribe B12 injections every nine weeks - Prescribe folic acid  1 mg daily - Prescribe anti-nausea medication - Prescribe Emla  cream for port site numbing - Refer to support group  Radiation-Induced Headache Severe headache following first radiation session for brain metastases, a common side effect of radiation therapy. - Monitor headache symptoms - Provide supportive care as needed  General Health Maintenance Discussed importance of maintaining overall health and provided resources for living with lung cancer. - Provide educational materials  on living with lung cancer - Encourage participation in a support group  Follow-up - Schedule follow-up appointments as needed - Ensure B12 injection before leaving.   The patient was advised to call immediately if she has any other concerning symptoms in the interval. The patient voices understanding of current disease status and treatment options and is in agreement with the current care plan. All questions were answered. The patient knows to call the clinic with any problems, questions or concerns. We can certainly see the patient much sooner if necessary.  Disclaimer: This note was dictated with voice recognition software. Similar sounding words can inadvertently be transcribed and may not be corrected upon review.

## 2023-08-07 ENCOUNTER — Other Ambulatory Visit: Payer: Self-pay

## 2023-08-07 ENCOUNTER — Ambulatory Visit
Admission: RE | Admit: 2023-08-07 | Discharge: 2023-08-07 | Disposition: A | Discharge status: Home / Self Care | Payer: Medicare Other | Source: Ambulatory Visit | Attending: Radiation Oncology | Admitting: Radiation Oncology

## 2023-08-07 ENCOUNTER — Telehealth: Payer: Self-pay | Admitting: Internal Medicine

## 2023-08-07 ENCOUNTER — Encounter: Payer: Self-pay | Admitting: Internal Medicine

## 2023-08-07 DIAGNOSIS — Z5111 Encounter for antineoplastic chemotherapy: Secondary | ICD-10-CM | POA: Diagnosis not present

## 2023-08-07 LAB — RAD ONC ARIA SESSION SUMMARY
Course Elapsed Days: 2
Plan Fractions Treated to Date: 3
Plan Prescribed Dose Per Fraction: 3 Gy
Plan Total Fractions Prescribed: 10
Plan Total Prescribed Dose: 30 Gy
Reference Point Dosage Given to Date: 9 Gy
Reference Point Session Dosage Given: 3 Gy
Session Number: 3

## 2023-08-08 ENCOUNTER — Ambulatory Visit
Admission: RE | Admit: 2023-08-08 | Discharge: 2023-08-08 | Disposition: A | Discharge status: Home / Self Care | Payer: Medicare Other | Source: Ambulatory Visit | Attending: Radiation Oncology | Admitting: Radiation Oncology

## 2023-08-08 ENCOUNTER — Other Ambulatory Visit: Payer: Self-pay

## 2023-08-08 DIAGNOSIS — Z5111 Encounter for antineoplastic chemotherapy: Secondary | ICD-10-CM | POA: Diagnosis not present

## 2023-08-08 LAB — RAD ONC ARIA SESSION SUMMARY
Course Elapsed Days: 3
Plan Fractions Treated to Date: 4
Plan Prescribed Dose Per Fraction: 3 Gy
Plan Total Fractions Prescribed: 10
Plan Total Prescribed Dose: 30 Gy
Reference Point Dosage Given to Date: 12 Gy
Reference Point Session Dosage Given: 3 Gy
Session Number: 4

## 2023-08-09 ENCOUNTER — Other Ambulatory Visit: Payer: Self-pay

## 2023-08-09 ENCOUNTER — Ambulatory Visit
Admission: RE | Admit: 2023-08-09 | Discharge: 2023-08-09 | Disposition: A | Discharge status: Home / Self Care | Payer: Medicare Other | Source: Ambulatory Visit | Attending: Radiation Oncology

## 2023-08-09 DIAGNOSIS — Z5111 Encounter for antineoplastic chemotherapy: Secondary | ICD-10-CM | POA: Diagnosis not present

## 2023-08-09 LAB — RAD ONC ARIA SESSION SUMMARY
Course Elapsed Days: 4
Plan Fractions Treated to Date: 5
Plan Prescribed Dose Per Fraction: 3 Gy
Plan Total Fractions Prescribed: 10
Plan Total Prescribed Dose: 30 Gy
Reference Point Dosage Given to Date: 15 Gy
Reference Point Session Dosage Given: 3 Gy
Session Number: 5

## 2023-08-12 ENCOUNTER — Other Ambulatory Visit: Payer: Self-pay

## 2023-08-12 ENCOUNTER — Ambulatory Visit
Admission: RE | Admit: 2023-08-12 | Discharge: 2023-08-12 | Disposition: A | Discharge status: Home / Self Care | Payer: Medicare Other | Source: Ambulatory Visit | Attending: Radiation Oncology | Admitting: Radiation Oncology

## 2023-08-12 ENCOUNTER — Ambulatory Visit: Payer: Medicare Other

## 2023-08-12 DIAGNOSIS — Z5111 Encounter for antineoplastic chemotherapy: Secondary | ICD-10-CM | POA: Diagnosis not present

## 2023-08-12 LAB — RAD ONC ARIA SESSION SUMMARY
Course Elapsed Days: 7
Plan Fractions Treated to Date: 6
Plan Prescribed Dose Per Fraction: 3 Gy
Plan Total Fractions Prescribed: 10
Plan Total Prescribed Dose: 30 Gy
Reference Point Dosage Given to Date: 18 Gy
Reference Point Session Dosage Given: 3 Gy
Session Number: 6

## 2023-08-13 ENCOUNTER — Ambulatory Visit: Payer: Self-pay | Admitting: Radiation Oncology

## 2023-08-13 ENCOUNTER — Ambulatory Visit
Admission: RE | Admit: 2023-08-13 | Discharge: 2023-08-13 | Discharge status: Home / Self Care | Payer: Medicare Other | Source: Ambulatory Visit | Attending: Radiation Oncology

## 2023-08-13 ENCOUNTER — Other Ambulatory Visit: Payer: Self-pay

## 2023-08-13 DIAGNOSIS — Z5111 Encounter for antineoplastic chemotherapy: Secondary | ICD-10-CM | POA: Diagnosis not present

## 2023-08-13 LAB — RAD ONC ARIA SESSION SUMMARY
Course Elapsed Days: 8
Plan Fractions Treated to Date: 7
Plan Prescribed Dose Per Fraction: 3 Gy
Plan Total Fractions Prescribed: 10
Plan Total Prescribed Dose: 30 Gy
Reference Point Dosage Given to Date: 21 Gy
Reference Point Session Dosage Given: 3 Gy
Session Number: 7

## 2023-08-14 ENCOUNTER — Ambulatory Visit: Payer: Medicare Other | Admitting: Radiation Oncology

## 2023-08-14 ENCOUNTER — Other Ambulatory Visit: Payer: Self-pay

## 2023-08-14 ENCOUNTER — Ambulatory Visit
Admission: RE | Admit: 2023-08-14 | Discharge: 2023-08-14 | Disposition: A | Discharge status: Home / Self Care | Payer: Medicare Other | Source: Ambulatory Visit | Attending: Radiation Oncology | Admitting: Radiation Oncology

## 2023-08-14 DIAGNOSIS — Z5111 Encounter for antineoplastic chemotherapy: Secondary | ICD-10-CM | POA: Diagnosis not present

## 2023-08-14 LAB — RAD ONC ARIA SESSION SUMMARY
Course Elapsed Days: 9
Plan Fractions Treated to Date: 8
Plan Prescribed Dose Per Fraction: 3 Gy
Plan Total Fractions Prescribed: 10
Plan Total Prescribed Dose: 30 Gy
Reference Point Dosage Given to Date: 24 Gy
Reference Point Session Dosage Given: 3 Gy
Session Number: 8

## 2023-08-15 ENCOUNTER — Other Ambulatory Visit: Payer: Self-pay | Admitting: Radiology

## 2023-08-15 ENCOUNTER — Other Ambulatory Visit: Payer: Self-pay

## 2023-08-15 ENCOUNTER — Ambulatory Visit
Admission: RE | Admit: 2023-08-15 | Discharge: 2023-08-15 | Disposition: A | Discharge status: Home / Self Care | Payer: Medicare Other | Source: Ambulatory Visit | Attending: Radiation Oncology | Admitting: Radiation Oncology

## 2023-08-15 DIAGNOSIS — Z5111 Encounter for antineoplastic chemotherapy: Secondary | ICD-10-CM | POA: Diagnosis not present

## 2023-08-15 LAB — RAD ONC ARIA SESSION SUMMARY
Course Elapsed Days: 10
Plan Fractions Treated to Date: 9
Plan Prescribed Dose Per Fraction: 3 Gy
Plan Total Fractions Prescribed: 10
Plan Total Prescribed Dose: 30 Gy
Reference Point Dosage Given to Date: 27 Gy
Reference Point Session Dosage Given: 3 Gy
Session Number: 9

## 2023-08-16 ENCOUNTER — Other Ambulatory Visit: Payer: Self-pay

## 2023-08-16 ENCOUNTER — Inpatient Hospital Stay: Payer: Medicare Other

## 2023-08-16 ENCOUNTER — Ambulatory Visit
Admission: RE | Admit: 2023-08-16 | Discharge: 2023-08-16 | Disposition: A | Discharge status: Home / Self Care | Payer: Medicare Other | Source: Ambulatory Visit | Attending: Radiation Oncology

## 2023-08-16 DIAGNOSIS — Z5111 Encounter for antineoplastic chemotherapy: Secondary | ICD-10-CM | POA: Diagnosis not present

## 2023-08-16 LAB — RAD ONC ARIA SESSION SUMMARY
Course Elapsed Days: 11
Plan Fractions Treated to Date: 10
Plan Prescribed Dose Per Fraction: 3 Gy
Plan Total Fractions Prescribed: 10
Plan Total Prescribed Dose: 30 Gy
Reference Point Dosage Given to Date: 30 Gy
Reference Point Session Dosage Given: 3 Gy
Session Number: 10

## 2023-08-16 NOTE — H&P (Signed)
Chief Complaint: Recurrent/metastatic right lung adenocarcinoma; referred for port a cath placement to assist with treatment  Referring Provider(s): Mohamed,M  Supervising Physician: Richarda Overlie  Patient Status: [facility] - Out-pt  History of Present Illness: [name] is a 68 y.o. female ex smoker with PMH sig for depression, DM, GERD, colon polyps, HLD, HTN who presents now with recurrent/metastatic adenocarcinoma right lung. Original diagnosis was in 2018. At that time pt had RUL lobectomy.  Recent imaging has shown metastatic adenopathy in the thorax as well as mediastinum, axillary lymph nodes and potential local recurrence at the right hilum along the inferior margin of the resection staple line with bilateral hypermetabolic adrenal metastasis and hypermetabolic bone metastasis to the T11 vertebral body as well as brain metastasis diagnosed in December 2024. She underwent right axillary lymph node biopsy on 08/02/23 by IR team. She is scheduled today for port a cath placement to assist with treatment.   *** Patient is Full Code  Past Medical History:  Diagnosis Date   Adenocarcinoma of right lung, stage 1 (HCC) 11/29/2016   Cancer (HCC)    Chronic pain    Depression    takes Lexapro daily   Diabetes mellitus    takes Metformin daily and has an insulin pump.Fasting blood sugar runs250   GERD (gastroesophageal reflux disease)    takes Pantoprazole daily   History of colon polyps    benign   Hyperlipidemia    takes Atorvastatin daily   Hypertension    takes Amlodipine and Lisinopril daily   Insomnia    takes Trazodone nightly   Nocturia    Thyroid disease    Urinary frequency    Urinary urgency     Past Surgical History:  Procedure Laterality Date   ABDOMINAL HYSTERECTOMY     ABDOMINAL SURGERY     BIOPSY  05/28/2019   Procedure: BIOPSY;  Surgeon: West Bali, MD;  [facility];  Service: Endoscopy;;  random colon/gastric/duodenum   BIOPSY  01/11/2023    Procedure: BIOPSY;  Surgeon: Lanelle Bal, DO;  [facility];  Service: Endoscopy;;   COLONOSCOPY  2005 MAC   TICs, IH   COLONOSCOPY N/A 03/08/2014   Procedure: COLONOSCOPY;  Surgeon: West Bali, MD;  [facility];  Service: Endoscopy;  Laterality: N/A;  1015   COLONOSCOPY WITH PROPOFOL N/A 05/28/2019   Procedure: COLONOSCOPY WITH PROPOFOL;  Surgeon: West Bali, MD;  [facility];  Service: Endoscopy;  Laterality: N/A;  9:15am   CYSTOSTOMY  11/22/2011   Procedure: CYSTOSTOMY SUPRAPUBIC;  Surgeon: Ky Barban, MD;  Location: [facility];  Service: Urology;  Laterality: N/A;   ESOPHAGOGASTRODUODENOSCOPY N/A 03/08/2014   Procedure: ESOPHAGOGASTRODUODENOSCOPY (EGD);  Surgeon: West Bali, MD;  [facility];  Service: Endoscopy;  Laterality: N/A;   ESOPHAGOGASTRODUODENOSCOPY     ESOPHAGOGASTRODUODENOSCOPY (EGD) WITH PROPOFOL N/A 05/28/2019   Procedure: ESOPHAGOGASTRODUODENOSCOPY (EGD) WITH PROPOFOL;  Surgeon: West Bali, MD;  [facility];  Service: Endoscopy;  Laterality: N/A;   ESOPHAGOGASTRODUODENOSCOPY (EGD) WITH PROPOFOL N/A 01/11/2023   Procedure: ESOPHAGOGASTRODUODENOSCOPY (EGD) WITH PROPOFOL;  Surgeon: Lanelle Bal, DO;  [facility];  Service: Endoscopy;  Laterality: N/A;  9:45 am, asa 3   GIVENS CAPSULE STUDY N/A 06/15/2019   Procedure: GIVENS CAPSULE STUDY;  Surgeon: West Bali, MD;  [facility];  Service: Endoscopy;  Laterality: N/A;  7:30am   HALLUX VALGUS CORRECTION  bilateral foot surgery for bone repairs-multiple   LOBECTOMY Right 10/15/2016   Procedure: RIGHT UPPER LOBECTOMY;  Surgeon: Loreli Slot, MD;  Location: [facility] OR;  Service: Thoracic;  Laterality: Right;   LYMPH NODE DISSECTION Right 10/15/2016   Procedure: LYMPH NODE DISSECTION;  Surgeon: Loreli Slot, MD;  Location: [facility] OR;  Service: Thoracic;  Laterality: Right;   POLYPECTOMY  05/28/2019   Procedure:  POLYPECTOMY;  Surgeon: West Bali, MD;  [facility];  Service: Endoscopy;;   SAVORY DILATION N/A 05/28/2019   Procedure: SAVORY DILATION;  Surgeon: West Bali, MD;  [facility];  Service: Endoscopy;  Laterality: N/A;  15/16/17   VAGINA RECONSTRUCTION SURGERY     tvt 2006- [place]   VIDEO ASSISTED THORACOSCOPY (VATS)/WEDGE RESECTION Right 10/15/2016   Procedure: RIGHT VIDEO ASSISTED THORACOSCOPY (VATS)/WEDGE RESECTION;  Surgeon: Loreli Slot, MD;  Location: [facility] OR;  Service: Thoracic;  Laterality: Right;    Allergies: Synthroid [levothyroxine sodium], Hydrochlorothiazide, and Iodine  Medications: Prior to Admission medications   Medication Sig Start Date End Date Taking? Authorizing Provider  ACCU-CHEK AVIVA PLUS test strip USE TO MONITOR GLUCOSE LEVELS TWICE A DAY E11.9 11/27/21   Romero Belling, MD  ACCU-CHEK SOFTCLIX LANCETS lancets Use to monitor glucose levels BID; E11.9 Patient taking differently: 1 each by Other route 2 (two) times daily. E11.9 08/21/18   Romero Belling, MD  acetaminophen (TYLENOL) 500 MG tablet Take 1 tablet (500 mg total) by mouth every 6 (six) hours as needed. Patient taking differently: Take 500 mg by mouth every 6 (six) hours as needed (for pain). 08/07/17   Horton, Mayer Masker, MD  atorvastatin (LIPITOR) 20 MG tablet Take 20 mg by mouth daily. 04/27/17   [provider]  Blood Glucose Calibration (GLUCOSE CONTROL) SOLN 1 Bottle by In Vitro route as needed. Use to calibrate Accu-Chek Aviva Plus device prn; please provide control solution compatible with Accu-Chek Aviva Plus device. Patient taking differently: 1 each by Other route as needed (Use to calibrate glucometer). E11.9 08/21/18   Romero Belling, MD  Blood Glucose Monitoring Suppl (ACCU-CHEK AVIVA PLUS) w/Device KIT 1 each by Does not apply route 2 (two) times daily. Use to monitor glucose levels BID; E11.9 Patient taking differently: 1 each by Does not apply  route 2 (two) times daily. E11.9 08/19/18   Romero Belling, MD  chlorthalidone (HYGROTON) 25 MG tablet Take 12.5 mg by mouth daily.  11/26/16   [provider]  clobetasol cream (TEMOVATE) 0.05 % Apply 1 Application topically 2 (two) times daily as needed (itching).    [provider]  cyclobenzaprine (FLEXERIL) 10 MG tablet TAKE 1 TABLET BY MOUTH TWICE A DAY AS NEEDED FOR MUSCLE SPASMS    [provider]  dexamethasone (DECADRON) 4 MG tablet Take 1 tablet (4 mg total) by mouth 3 (three) times daily. 07/22/23   Si Gaul, MD  diclofenac Sodium (VOLTAREN ARTHRITIS PAIN) 1 % GEL Apply 4 g topically 4 (four) times daily. Patient taking differently: Apply 4 g topically 3 (three) times daily as needed (joint pain). 05/15/22   Leath-Warren, Sadie Haber, NP  escitalopram (LEXAPRO) 10 MG tablet Take 1 tablet (10 mg total) by mouth every morning. 09/13/22   Myrlene Broker, MD  folic acid (FOLVITE) 1 MG tablet Take 1 tablet (1 mg total) by mouth daily. Start 7 days before pemetrexed chemotherapy. Continue until 21 days after pemetrexed completed. 08/06/23   Si Gaul, MD  ibuprofen (ADVIL) 800 MG  tablet Take 800 mg by mouth every 8 (eight) hours as needed for moderate pain.    [provider]  insulin glargine (LANTUS SOLOSTAR) 100 UNIT/ML Solostar Pen Inject 15 Units into the skin at bedtime. 06/25/23   Dani Gobble, NP  Insulin Pen Needle (B-D ULTRAFINE III SHORT PEN) 31G X 8 MM MISC USE AS DIRECTED 01/17/23   Dani Gobble, NP  lidocaine-prilocaine (EMLA) cream Apply to affected area once 08/06/23   Si Gaul, MD  lisinopril (ZESTRIL) 10 MG tablet Take 10 mg by mouth daily. 05/30/20   [provider]  meloxicam (MOBIC) 7.5 MG tablet Take 7.5 mg by mouth daily. 07/10/23   [provider]  memantine [facility] TITRATION PACK) tablet pack 5 mg/day for =1 week; 5 mg twice daily for =1 week; 15 mg/day given in 5 mg and 10 mg separated doses  for =1 week; then 10 mg twice daily. Take through 01/20/2024, then stop. 08/05/23   Lonie Peak, MD  metFORMIN (GLUCOPHAGE-XR) 500 MG 24 hr tablet Take 1 tablet (500 mg total) by mouth daily with breakfast. 06/25/23   Dani Gobble, NP  ondansetron (ZOFRAN) 8 MG tablet Take 1 tablet (8 mg total) by mouth every 8 (eight) hours as needed for nausea or vomiting. Start on the third day after carboplatin. 08/06/23   Si Gaul, MD  pantoprazole (PROTONIX) 40 MG tablet Take 1 tablet (40 mg total) by mouth 2 (two) times daily. 01/11/23 01/11/24  Lanelle Bal, DO  prochlorperazine (COMPAZINE) 10 MG tablet Take 1 tablet (10 mg total) by mouth every 6 (six) hours as needed for nausea or vomiting. 08/06/23   Si Gaul, MD  tirzepatide [facility]) 15 MG/0.5ML Pen Inject 15 mg into the skin once a week. 06/25/23   Dani Gobble, NP  traZODone (DESYREL) 100 MG tablet Take 2 tablets (200 mg total) by mouth at bedtime. 09/13/22   Myrlene Broker, MD  triamcinolone cream (KENALOG) 0.1 % Apply 1 application topically 2 (two) times daily. Patient taking differently: Apply 1 application  topically 2 (two) times daily as needed (irritation). 03/30/21   Rennis Harding, PA-C     Family History  Problem Relation Age of Onset   Asthma Other    Diabetes Other    Arthritis Other    Cancer Mother    Cancer Brother    Anesthesia problems Neg Hx    Hypotension Neg Hx    Malignant hyperthermia Neg Hx    Pseudochol deficiency Neg Hx    Colon polyps Neg Hx    Colon cancer Neg Hx    Celiac disease Neg Hx    Pancreatic cancer Neg Hx    Stomach cancer Neg Hx    Ulcerative colitis Neg Hx    Crohn's disease Neg Hx     Social History   Socioeconomic History   Marital status: Single    Spouse name: Not on file   Number of children: Not on file   Years of education: Not on file   Highest education level: Not on file  Occupational History   Not on file  Tobacco Use   Smoking status: Former     Current packs/day: 1.00    Average packs/day: 1 pack/day for 32.0 years (32.0 ttl pk-yrs)    Types: Cigarettes   Smokeless tobacco: Never   Tobacco comments:    quit smoking 20 yrs ago  Vaping Use   Vaping status: Never Used  Substance and Sexual Activity  Alcohol use: No    Comment: rarely   Drug use: No   Sexual activity: Not on file  Other Topics Concern   Not on file  Social History Narrative   Not on file   Social Drivers of Health   Financial Resource Strain: Not on file  Food Insecurity: No Food Insecurity (07/23/2023)   Hunger Vital Sign    Worried About Running Out of Food in the Last Year: Never true    Ran Out of Food in the Last Year: Never true  Transportation Needs: No Transportation Needs (07/23/2023)   PRAPARE - Administrator, Civil Service (Medical): No    Lack of Transportation (Non-Medical): No  Physical Activity: Not on file  Stress: Not on file  Social Connections: Not on file       Review of Systems  Vital Signs:   Advance Care Plan: no documents on file.  Physical Exam  Imaging: [place] CORE BIOPSY (LYMPH NODES) Result Date: 08/02/2023 INDICATION: History of lung carcinoma and bilateral hypermetabolic axillary lymphadenopathy by PET scan. EXAM: ULTRASOUND GUIDED CORE BIOPSY OF RIGHT AXILLARY LYMPH NODE MEDICATIONS: None. ANESTHESIA/SEDATION: None PROCEDURE: The procedure, risks, benefits, and alternatives were explained to the patient. Questions regarding the procedure were encouraged and answered. The patient understands and consents to the procedure. A time-out was performed prior to initiating the procedure. The right axilla was prepped with chlorhexidine in a sterile fashion, and a sterile drape was applied covering the operative field. A sterile gown and sterile gloves were used for the procedure. Local anesthesia was provided with 1% Lidocaine. After localizing an enlarged right axillary lymph node, 4 separate 16 gauge core biopsy  samples were obtained in different portions of the lymph node under ultrasound guidance. Core biopsy samples were submitted in saline. Additional ultrasound was performed. COMPLICATIONS: None immediate. FINDINGS: Enlarged right axillary lymph node measures up to approximately 3.5 x 2.3 x 1.8 cm. Solid core biopsy samples were obtained. IMPRESSION: Ultrasound-guided core biopsy performed of an enlarged right axillary lymph node. This corresponds to 1 of the hypermetabolic lymph nodes by PET scan. Electronically Signed   By: Irish Lack M.D.   On: 08/02/2023 15:18   MR Brain W Wo Contrast Result Date: 07/30/2023 CLINICAL DATA:  Metastatic disease evaluation 3T SRS Protocol for radiaiton treatment planning EXAM: MRI HEAD WITHOUT AND WITH CONTRAST TECHNIQUE: Multiplanar, multiecho pulse sequences of the brain and surrounding structures were obtained without and with intravenous contrast. CONTRAST:  8.67mL GADAVIST GADOBUTROL 1 MMOL/ML IV SOLN COMPARISON:  None Available. FINDINGS: Brain: Many of the previously seen enhancing lesions are slightly increased in size. For example, index left posterior frontal lesion measures 8 x 8 mm on series 1100 image 181 (previously 7 x 6 mm). In addition, there are numerous additional punctate enhancing metastasis which were not well characterized on the prior, which could reflect new metastases or metastases that are better characterized due to improved 3 T technique. There are over 50 lesions identified and annotated on series 1100. Edema surrounding some of these lesions without significant mass effect or midline shift. No evidence of acute infarct, acute hemorrhage, or hydrocephalus. Vascular: Major arterial flow voids are maintained at the skull base. Skull and upper cervical spine: Normal marrow signal. Sinuses/Orbits: Clear sinuses.  No acute orbital findings. Other: No mastoid effusions. IMPRESSION: 1. Slightly increased size of the previously identified metastases with  numerous punctate enhancing metastases (over 50) that could be new or visible given improved technique (annotated on  series 1100). 2. Edema surrounding some of these lesions without mass effect or midline shift. Electronically Signed   By: Feliberto Harts M.D.   On: 07/30/2023 20:27    Labs:  CBC: Recent Labs    06/13/23 1544 07/22/23 1008 08/06/23 0801  WBC 10.0 8.7 8.2  HGB 11.8* 12.3 12.3  HCT 35.6* 37.3 37.6  PLT 276 303 306    COAGS: No results for input(s): "INR", "APTT" in the last 8760 hours.  BMP: Recent Labs    10/29/22 1158 06/13/23 1544 07/22/23 1008 08/06/23 0801  NA 142 142 139 139  K 4.5 4.4 4.2 4.0  CL 104 106 104 102  CO2 23 31 30 31   GLUCOSE 103* 142* 142* 159*  BUN 21 21 10 12   CALCIUM 9.6 9.2 9.5 9.7  CREATININE 0.82 0.73 0.70 0.77  GFRNONAA  --  >60 >60 >60    LIVER FUNCTION TESTS: Recent Labs    10/29/22 1158 06/13/23 1544 07/22/23 1008 08/06/23 0801  BILITOT 0.4 0.4 0.6 0.6  AST 17 15 15  12*  ALT 17 13 11 10   ALKPHOS 90 63 71 72  PROT 6.2 6.5 6.7 7.0  ALBUMIN 4.3 3.9 4.1 4.1    TUMOR MARKERS: No results for input(s): "AFPTM", "CEA", "CA199", "CHROMGRNA" in the last 8760 hours.  Assessment and Plan: 68 y.o. female ex smoker with PMH sig for depression, DM, GERD, colon polyps, HLD, HTN who presents now with recurrent/metastatic adenocarcinoma right lung. Original diagnosis was in 2018. At that time pt had RUL lobectomy.  Recent imaging has shown metastatic adenopathy in the thorax as well as mediastinum, axillary lymph nodes and potential local recurrence at the right hilum along the inferior margin of the resection staple line with bilateral hypermetabolic adrenal metastasis and hypermetabolic bone metastasis to the T11 vertebral body as well as brain metastasis diagnosed in December 2024. She underwent right axillary lymph node biopsy on 08/02/23 by IR team. She is scheduled today for port a cath placement to assist with treatment. Risks  and benefits of image guided port-a-catheter placement was discussed with the patient including, but not limited to bleeding, infection, pneumothorax, or fibrin sheath development and need for additional procedures.  All of the patient's questions were answered, patient is agreeable to proceed. Consent signed and in chart.    Thank you for allowing our service to participate in YURY ZEPF 's care.  Electronically Signed: D. Jeananne Rama, PA-C   08/16/2023, 9:59 AM      I spent a total of    25 Minutes in face to face in clinical consultation, greater than 50% of which was counseling/coordinating care for port a cath placement

## 2023-08-19 ENCOUNTER — Other Ambulatory Visit: Payer: Self-pay

## 2023-08-19 ENCOUNTER — Encounter: Payer: Self-pay | Admitting: Internal Medicine

## 2023-08-19 ENCOUNTER — Other Ambulatory Visit: Payer: Self-pay | Admitting: Internal Medicine

## 2023-08-19 ENCOUNTER — Ambulatory Visit (HOSPITAL_COMMUNITY)
Admission: RE | Admit: 2023-08-19 | Discharge: 2023-08-19 | Disposition: A | Discharge status: Home / Self Care | Payer: Medicare Other | Source: Ambulatory Visit | Attending: Internal Medicine | Admitting: Internal Medicine

## 2023-08-19 ENCOUNTER — Encounter (HOSPITAL_COMMUNITY): Payer: Self-pay

## 2023-08-19 ENCOUNTER — Ambulatory Visit (HOSPITAL_COMMUNITY)
Admission: RE | Admit: 2023-08-19 | Discharge: 2023-08-19 | Disposition: A | Discharge status: Home / Self Care | Payer: Medicare Other | Source: Ambulatory Visit | Attending: Internal Medicine

## 2023-08-19 DIAGNOSIS — C7931 Secondary malignant neoplasm of brain: Secondary | ICD-10-CM | POA: Diagnosis not present

## 2023-08-19 DIAGNOSIS — C3491 Malignant neoplasm of unspecified part of right bronchus or lung: Secondary | ICD-10-CM

## 2023-08-19 DIAGNOSIS — Z7985 Long-term (current) use of injectable non-insulin antidiabetic drugs: Secondary | ICD-10-CM | POA: Diagnosis not present

## 2023-08-19 DIAGNOSIS — K219 Gastro-esophageal reflux disease without esophagitis: Secondary | ICD-10-CM | POA: Diagnosis not present

## 2023-08-19 DIAGNOSIS — C7951 Secondary malignant neoplasm of bone: Secondary | ICD-10-CM | POA: Insufficient documentation

## 2023-08-19 DIAGNOSIS — E119 Type 2 diabetes mellitus without complications: Secondary | ICD-10-CM | POA: Insufficient documentation

## 2023-08-19 DIAGNOSIS — Z7984 Long term (current) use of oral hypoglycemic drugs: Secondary | ICD-10-CM | POA: Diagnosis not present

## 2023-08-19 DIAGNOSIS — Z794 Long term (current) use of insulin: Secondary | ICD-10-CM | POA: Insufficient documentation

## 2023-08-19 DIAGNOSIS — I10 Essential (primary) hypertension: Secondary | ICD-10-CM | POA: Diagnosis not present

## 2023-08-19 DIAGNOSIS — Z87891 Personal history of nicotine dependence: Secondary | ICD-10-CM | POA: Diagnosis not present

## 2023-08-19 DIAGNOSIS — F32A Depression, unspecified: Secondary | ICD-10-CM | POA: Insufficient documentation

## 2023-08-19 DIAGNOSIS — C778 Secondary and unspecified malignant neoplasm of lymph nodes of multiple regions: Secondary | ICD-10-CM | POA: Diagnosis not present

## 2023-08-19 DIAGNOSIS — E785 Hyperlipidemia, unspecified: Secondary | ICD-10-CM | POA: Insufficient documentation

## 2023-08-19 HISTORY — PX: IR IMAGING GUIDED PORT INSERTION: IMG5740

## 2023-08-19 LAB — GLUCOSE, CAPILLARY: Glucose-Capillary: 152 mg/dL — ABNORMAL HIGH (ref 70–99)

## 2023-08-19 MED ORDER — LIDOCAINE HCL 1 % IJ SOLN
20.0000 mL | Freq: Once | INTRAMUSCULAR | Status: AC
  Administered 2023-08-19: 10 mL via INTRADERMAL

## 2023-08-19 MED ORDER — FENTANYL CITRATE (PF) 100 MCG/2ML IJ SOLN
INTRAMUSCULAR | Status: AC | PRN
  Administered 2023-08-19 (×4): 25 ug via INTRAVENOUS

## 2023-08-19 MED ORDER — MIDAZOLAM HCL 2 MG/2ML IJ SOLN
INTRAMUSCULAR | Status: AC | PRN
  Administered 2023-08-19 (×2): .5 mg via INTRAVENOUS
  Administered 2023-08-19: 1 mg via INTRAVENOUS

## 2023-08-19 MED ORDER — FENTANYL CITRATE (PF) 100 MCG/2ML IJ SOLN
INTRAMUSCULAR | Status: AC
  Filled 2023-08-19: qty 2

## 2023-08-19 MED ORDER — LIDOCAINE-EPINEPHRINE 1 %-1:100000 IJ SOLN
INTRAMUSCULAR | Status: AC
Start: 2023-08-19 — End: ?
  Filled 2023-08-19: qty 1

## 2023-08-19 MED ORDER — HEPARIN SOD (PORK) LOCK FLUSH 100 UNIT/ML IV SOLN
500.0000 [IU] | Freq: Once | INTRAVENOUS | Status: AC
  Administered 2023-08-19: 500 [IU] via INTRAVENOUS

## 2023-08-19 MED ORDER — LIDOCAINE-EPINEPHRINE 1 %-1:100000 IJ SOLN
20.0000 mL | Freq: Once | INTRAMUSCULAR | Status: AC
  Administered 2023-08-19: 20 mL via INTRADERMAL

## 2023-08-19 MED ORDER — MIDAZOLAM HCL 2 MG/2ML IJ SOLN
INTRAMUSCULAR | Status: AC
Start: 2023-08-19 — End: ?
  Filled 2023-08-19: qty 2

## 2023-08-19 MED ORDER — SODIUM CHLORIDE 0.9 % IV SOLN: INTRAVENOUS | Status: DC

## 2023-08-19 MED ORDER — HEPARIN SOD (PORK) LOCK FLUSH 100 UNIT/ML IV SOLN
INTRAVENOUS | Status: AC
  Filled 2023-08-19: qty 5

## 2023-08-19 MED ORDER — LIDOCAINE HCL 1 % IJ SOLN
INTRAMUSCULAR | Status: AC
  Filled 2023-08-19: qty 20

## 2023-08-19 NOTE — Radiation Completion Notes (Signed)
Patient Name: MARIALI, GUILE MRN: [id number] Date of Birth: [birth year] Referring Physician: Si Gaul, M.D. Date of Service: 2023-08-19 Radiation Oncologist: Lonie Peak, M.D. [facility] [place]                             RADIATION ONCOLOGY END OF TREATMENT NOTE     Diagnosis: C79.31 Secondary malignant neoplasm of brain Staging on 2021-06-14: Stage IV adenocarcinoma of lung, right (HCC) T=cT1a, N=cN0, M=cM0 Intent: Palliative     ==========DELIVERED PLANS==========  First Treatment Date: 2023-08-05 Last Treatment Date: 2023-08-16   Plan Name: Brain_HA Site: Brain Technique: IMRT Mode: Photon Dose Per Fraction: 3 Gy Prescribed Dose (Delivered / Prescribed): 30 Gy / 30 Gy Prescribed Fxs (Delivered / Prescribed): 10 / 10     ==========ON TREATMENT VISIT DATES========== 2023-08-05, 2023-08-12     ==========UPCOMING VISITS==========       ==========APPENDIX - ON TREATMENT VISIT NOTES==========   See weekly On Treatment Notes in Epic for details in the Media tab (listed as Progress notes on the On Treatment Visit Dates listed above).

## 2023-08-19 NOTE — Discharge Instructions (Signed)
Please call Interventional Radiology clinic [phone] with any questions or concerns.  You may remove your dressing and shower tomorrow.  After the procedure, it is common to have: Discomfort at the port insertion site. Bruising on the skin over the port. This should improve over 3-4 days  Follow these instructions at home:  Medication: Do not use Aspirin or ibuprofen products, such as Advil or Motrin, as it may increase bleeding.  You may resume your usual medications as ordered by your doctor. If your doctor prescribed antibiotics, take them as directed. Do not stop taking them just because you feel better. You need to take the full course of antibiotics.  Eating and drinking: Drink plenty of liquids to keep your urine pale yellow You can resume your regular diet as directed by your doctor   Care of the procedure site Follow instructions from your health care provider about how to take care of your port insertion site. Make sure you: After your port is placed, you will get a manufacturer's information card. The card has information about your port. Keep this card with you at all times Make sure to remember what type of port you have Take care of the port as told by your health care provider DO NOT use EMLA cream for 2 weeks after port placement -the cream will remove the surgical glue on your incision DO NOT use any lotions, creams, or ointments on incision for 2 weeks. This will remove the surgical glue on your incision Wash your hands with soap and water before and after you change your bandage (dressing). If soap and water are not available, use hand sanitizer Change your dressing as told by your health care provider Leave skin glue, or adhesive strips in place. These skin closures may need to stay in place for 2 weeks or longer Check your port insertion site every day for signs of infection. Check for: Redness, swelling, or pain Fluid or blood Warmth Pus or a bad  smell  Activity Return to your normal activities as told by your health care provider. Ask your health care provider what activities are safe for you Do not lift anything that is heavier than 10 lb (4.5 kg), or the limit that you are told, until your health care provider says that it is safe Do not take baths, swim, or use a hot tub until your health care provider approves. Take showers only. Keep all follow-up visits as told by your doctor  Contact a health care provider if: You cannot flush your port with saline as directed, or you cannot draw blood from the port You have a fever or chills You have redness, swelling, or pain around your port insertion site You have fluid or blood coming from your port insertion site Your port insertion site feels warm to the touch You have pus or a bad smell coming from the port insertion site  Get help right away if: You have chest pain or shortness of breath You have bleeding from your port that you cannot control   Moderate Conscious Sedation-Care After  This sheet gives you information about how to care for yourself after your procedure. Your health care provider may also give you more specific instructions. If you have problems or questions, contact your health care provider.  After the procedure, it is common to have: Sleepiness for several hours. Impaired judgment for several hours. Difficulty with balance. Vomiting if you eat too soon.  Follow these instructions at home:  Rest. Do  not participate in activities where you could fall or become injured. Do not drive or use machinery. Do not drink alcohol. Do not take sleeping pills or medicines that cause drowsiness. Do not make important decisions or sign legal documents. Do not take care of children on your own.  Eating and drinking Follow the diet recommended by your health care provider. Drink enough fluid to keep your urine pale yellow. If you vomit: Drink water, juice, or soup  when you can drink without vomiting. Make sure you have little or no nausea before eating solid foods.  General instructions Take over-the-counter and prescription medicines only as told by your health care provider. Have a responsible adult stay with you for the time you are told. It is important to have someone help care for you until you are awake and alert. Do not smoke. Keep all follow-up visits as told by your health care provider. This is important.  Contact a health care provider if: You are still sleepy or having trouble with balance after 24 hours. You feel light-headed. You keep feeling nauseous or you keep vomiting. You develop a rash. You have a fever. You have redness or swelling around the IV site.  Get help right away if: You have trouble breathing. You have new-onset confusion at home.  This information is not intended to replace advice given to you by your health care provider. Make sure you discuss any questions you have with your healthcare provider.

## 2023-08-19 NOTE — Procedures (Signed)
 Interventional Radiology Procedure:   Indications: Metastatic lung cancer  Procedure: Port placement  Findings: Right jugular port, tip at SVC/RA junction  Complications: None     EBL: Minimal, less than 10 ml  Plan: Discharge in one hour.  Keep port site and incisions dry for at least 24 hours.     Finnleigh Marchetti R. Lowella Dandy, MD  Pager: [phone]

## 2023-08-20 MED FILL — Fosaprepitant Dimeglumine For IV Infusion 150 MG (Base Eq): INTRAVENOUS | Qty: 5 | Status: AC

## 2023-08-21 ENCOUNTER — Inpatient Hospital Stay (HOSPITAL_BASED_OUTPATIENT_CLINIC_OR_DEPARTMENT_OTHER): Payer: Medicare Other | Admitting: Internal Medicine

## 2023-08-21 ENCOUNTER — Inpatient Hospital Stay: Payer: Medicare Other

## 2023-08-21 VITALS — BP 130/79 | HR 86 | Temp 98.1°F | Resp 18

## 2023-08-21 VITALS — BP 123/77 | HR 84 | Temp 97.9°F | Resp 16 | Ht 67.0 in | Wt 187.7 lb

## 2023-08-21 DIAGNOSIS — C3491 Malignant neoplasm of unspecified part of right bronchus or lung: Secondary | ICD-10-CM | POA: Diagnosis not present

## 2023-08-21 DIAGNOSIS — Z95828 Presence of other vascular implants and grafts: Secondary | ICD-10-CM | POA: Insufficient documentation

## 2023-08-21 DIAGNOSIS — Z5111 Encounter for antineoplastic chemotherapy: Secondary | ICD-10-CM | POA: Diagnosis not present

## 2023-08-21 LAB — CBC WITH DIFFERENTIAL (CANCER CENTER ONLY)
Abs Immature Granulocytes: 0.03 10*3/uL (ref 0.00–0.07)
Basophils Absolute: 0 10*3/uL (ref 0.0–0.1)
Basophils Relative: 1 %
Eosinophils Absolute: 0.3 10*3/uL (ref 0.0–0.5)
Eosinophils Relative: 4 %
HCT: 36.8 % (ref 36.0–46.0)
Hemoglobin: 11.7 g/dL — ABNORMAL LOW (ref 12.0–15.0)
Immature Granulocytes: 1 %
Lymphocytes Relative: 12 %
Lymphs Abs: 0.8 10*3/uL (ref 0.7–4.0)
MCH: 24.2 pg — ABNORMAL LOW (ref 26.0–34.0)
MCHC: 31.8 g/dL (ref 30.0–36.0)
MCV: 76.2 fL — ABNORMAL LOW (ref 80.0–100.0)
Monocytes Absolute: 0.5 10*3/uL (ref 0.1–1.0)
Monocytes Relative: 8 %
Neutro Abs: 5 10*3/uL (ref 1.7–7.7)
Neutrophils Relative %: 74 %
Platelet Count: 276 10*3/uL (ref 150–400)
RBC: 4.83 MIL/uL (ref 3.87–5.11)
RDW: 13.4 % (ref 11.5–15.5)
WBC Count: 6.6 10*3/uL (ref 4.0–10.5)
nRBC: 0 % (ref 0.0–0.2)

## 2023-08-21 LAB — CMP (CANCER CENTER ONLY)
ALT: 9 U/L (ref 0–44)
AST: 12 U/L — ABNORMAL LOW (ref 15–41)
Albumin: 3.8 g/dL (ref 3.5–5.0)
Alkaline Phosphatase: 63 U/L (ref 38–126)
Anion gap: 6 (ref 5–15)
BUN: 16 mg/dL (ref 8–23)
CO2: 30 mmol/L (ref 22–32)
Calcium: 9.4 mg/dL (ref 8.9–10.3)
Chloride: 103 mmol/L (ref 98–111)
Creatinine: 0.78 mg/dL (ref 0.44–1.00)
GFR, Estimated: >60 mL/min (ref >60)
Glucose, Bld: 167 mg/dL — ABNORMAL HIGH (ref 70–99)
Potassium: 3.8 mmol/L (ref 3.5–5.1)
Sodium: 139 mmol/L (ref 135–145)
Total Bilirubin: 0.5 mg/dL (ref 0.0–1.2)
Total Protein: 6.6 g/dL (ref 6.5–8.1)

## 2023-08-21 LAB — TSH: TSH: 1.243 u[IU]/mL (ref 0.350–4.500)

## 2023-08-21 MED ORDER — SODIUM CHLORIDE 0.9 % IV SOLN
500.0000 mg/m2 | Freq: Once | INTRAVENOUS | Status: AC
  Administered 2023-08-21: 1000 mg via INTRAVENOUS
  Filled 2023-08-21: qty 40

## 2023-08-21 MED ORDER — PALONOSETRON HCL INJECTION 0.25 MG/5ML
0.2500 mg | Freq: Once | INTRAVENOUS | Status: AC
  Administered 2023-08-21: 0.25 mg via INTRAVENOUS
  Filled 2023-08-21: qty 5

## 2023-08-21 MED ORDER — DEXAMETHASONE SODIUM PHOSPHATE 10 MG/ML IJ SOLN
10.0000 mg | Freq: Once | INTRAMUSCULAR | Status: AC
  Administered 2023-08-21: 10 mg via INTRAVENOUS
  Filled 2023-08-21: qty 1

## 2023-08-21 MED ORDER — HEPARIN SOD (PORK) LOCK FLUSH 100 UNIT/ML IV SOLN: 500.0000 [IU] | Freq: Once | INTRAVENOUS | Status: DC | PRN

## 2023-08-21 MED ORDER — SODIUM CHLORIDE 0.9 % IV SOLN
150.0000 mg | Freq: Once | INTRAVENOUS | Status: AC
  Administered 2023-08-21: 150 mg via INTRAVENOUS
  Filled 2023-08-21: qty 150

## 2023-08-21 MED ORDER — SODIUM CHLORIDE 0.9 % IV SOLN
200.0000 mg | Freq: Once | INTRAVENOUS | Status: AC
  Administered 2023-08-21: 200 mg via INTRAVENOUS
  Filled 2023-08-21: qty 200

## 2023-08-21 MED ORDER — SODIUM CHLORIDE 0.9% FLUSH
10.0000 mL | Freq: Once | INTRAVENOUS | Status: AC
  Administered 2023-08-21: 10 mL

## 2023-08-21 MED ORDER — SODIUM CHLORIDE 0.9% FLUSH: 10.0000 mL | INTRAVENOUS | Status: DC | PRN

## 2023-08-21 MED ORDER — SODIUM CHLORIDE 0.9 % IV SOLN: INTRAVENOUS | Status: DC

## 2023-08-21 MED ORDER — SODIUM CHLORIDE 0.9 % IV SOLN
488.0000 mg | Freq: Once | INTRAVENOUS | Status: AC
  Administered 2023-08-21: 490 mg via INTRAVENOUS
  Filled 2023-08-21: qty 49

## 2023-08-21 NOTE — Patient Instructions (Signed)
 [facility] - A DEPT OF [facility]  Discharge Instructions: Thank you for choosing [facility] to provide your oncology and hematology care.   If you have a lab appointment with the Cancer Center, please go directly to the Cancer Center and check in at the registration area.   Wear comfortable clothing and clothing appropriate for easy access to any Portacath or PICC line.   We strive to give you quality time with your provider. You may need to reschedule your appointment if you arrive late (15 or more minutes).  Arriving late affects you and other patients whose appointments are after yours.  Also, if you miss three or more appointments without notifying the office, you may be dismissed from the clinic at the provider's discretion.      For prescription refill requests, have your pharmacy contact our office and allow 72 hours for refills to be completed.    Today you received the following chemotherapy and/or immunotherapy agents: Keytruda, Alimta, Carboplatin      To help prevent nausea and vomiting after your treatment, we encourage you to take your nausea medication as directed.  BELOW ARE SYMPTOMS THAT SHOULD BE REPORTED IMMEDIATELY: *FEVER GREATER THAN 100.4 F (38 C) OR HIGHER *CHILLS OR SWEATING *NAUSEA AND VOMITING THAT IS NOT CONTROLLED WITH YOUR NAUSEA MEDICATION *UNUSUAL SHORTNESS OF BREATH *UNUSUAL BRUISING OR BLEEDING *URINARY PROBLEMS (pain or burning when urinating, or frequent urination) *BOWEL PROBLEMS (unusual diarrhea, constipation, pain near the anus) TENDERNESS IN MOUTH AND THROAT WITH OR WITHOUT PRESENCE OF ULCERS (sore throat, sores in mouth, or a toothache) UNUSUAL RASH, SWELLING OR PAIN  UNUSUAL VAGINAL DISCHARGE OR ITCHING   Items with * indicate a potential emergency and should be followed up as soon as possible or go to the Emergency Department if any problems should occur.  Please show the CHEMOTHERAPY ALERT  CARD or IMMUNOTHERAPY ALERT CARD at check-in to the Emergency Department and triage nurse.  Should you have questions after your visit or need to cancel or reschedule your appointment, please contact [facility] - A DE[facility]  Dept: [phone]  and follow the prompts.  Office hours are 8:00 a.m. to 4:30 p.m. Monday - Friday. Please note that voicemails left after 4:00 p.m. may not be returned until the following business day.  We are closed weekends and major holidays. You have access to a nurse at all times for urgent questions. Please call the main number to the clinic Dept: [phone] and follow the prompts.   For any non-urgent questions, you may also contact your provider using MyChart. We now offer e-Visits for anyone 69 and older to request care online for non-urgent symptoms. For details visit [email or web address].   Also download the MyChart app! Go to the app store, search "MyChart", open the app, select [place], and log in with your MyChart username and password.

## 2023-08-21 NOTE — Progress Notes (Signed)
[facility] [phone]   [phone]  OFFICE PROGRESS NOTE  Koirala, Dibas, MD [address] [place] [address]  DIAGNOSIS:  1) recurrent/metastatic non-small cell lung cancer, adenocarcinoma presented with metastatic adenopathy in the thorax as well as mediastinum, axillary lymph nodes and potential local recurrence at the right hilum along the inferior margin of the resection staple line with bilateral hypermetabolic adrenal metastasis and hypermetabolic bone metastasis to the T11 vertebral body as well as brain metastasis diagnosed in December 2024.  2) Stage IA (T1a, N0, M0) non-small cell lung cancer, adenocarcinoma presented with right upper lobe pulmonary nodule   Detected Alteration(s) / Biomarker(s) Associated FDA-approved therapies Clinical Trial Availability % cfDNA or Amplification KRAS G12C approved by FDA Adagrasib, Sotorasib approved in other indication Adagrasib+cetuximab Yes 2.8%  TP53 E204* None Yes 2.0%   PRIOR THERAPY:  1) S/P right VATS with right upper lobectomy and mediastinal lymph node dissection under the care of Dr. Dorris Fetch on October 15, 2016. 2) whole brain irradiation under the care of of Dr. Basilio Cairo.  CURRENT THERAPY: Systemic chemotherapy with carboplatin for AUC of 5, Alimta 500 Mg/M2 and Keytruda 200 Mg IV every 3 weeks.  First dose August 20, 2022.  INTERVAL HISTORY: [name] 68 y.o. female returns to the clinic today for annual follow-up visit accompanied by one of her neighbor. Discussed the use of AI scribe software for clinical note transcription with the patient, who gave verbal consent to proceed.  History of Present Illness   The patient, a 68 year old diagnosed with stage four non-small cell lung cancer with brain metastases in December 2024, has recently completed whole brain radiation due to metastasis to the brain. She is scheduled to begin the first cycle of  chemotherapy. The patient reports feeling well, with no complaints of nausea, vomiting, diarrhea, chest pain, or breathing issues. She does note hair loss, which is attributed to the recent radiation therapy.        MEDICAL HISTORY: Past Medical History:  Diagnosis Date   Adenocarcinoma of right lung, stage 1 (HCC) 11/29/2016   Cancer (HCC)    Chronic pain    Depression    takes Lexapro daily   Diabetes mellitus    takes Metformin daily and has an insulin pump.Fasting blood sugar runs250   GERD (gastroesophageal reflux disease)    takes Pantoprazole daily   History of colon polyps    benign   Hyperlipidemia    takes Atorvastatin daily   Hypertension    takes Amlodipine and Lisinopril daily   Insomnia    takes Trazodone nightly   Nocturia    Thyroid disease    Urinary frequency    Urinary urgency     ALLERGIES:  is allergic to synthroid [levothyroxine sodium], hydrochlorothiazide, and iodine.  MEDICATIONS:  Current Outpatient Medications  Medication Sig Dispense Refill   ACCU-CHEK AVIVA PLUS test strip USE TO MONITOR GLUCOSE LEVELS TWICE A DAY E11.9 100 strip 12   ACCU-CHEK SOFTCLIX LANCETS lancets Use to monitor glucose levels BID; E11.9 (Patient taking differently: 1 each by Other route 2 (two) times daily. E11.9) 100 each 12   acetaminophen (TYLENOL) 500 MG tablet Take 1 tablet (500 mg total) by mouth every 6 (six) hours as needed. (Patient taking differently: Take 500 mg by mouth every 6 (six) hours as needed (for pain).) 30 tablet 0   atorvastatin (LIPITOR) 20 MG tablet Take 20 mg by mouth daily.  11  Blood Glucose Calibration (GLUCOSE CONTROL) SOLN 1 Bottle by In Vitro route as needed. Use to calibrate Accu-Chek Aviva Plus device prn; please provide control solution compatible with Accu-Chek Aviva Plus device. (Patient taking differently: 1 each by Other route as needed (Use to calibrate glucometer). E11.9) 1 each 1   Blood Glucose Monitoring Suppl (ACCU-CHEK AVIVA PLUS)  w/Device KIT 1 each by Does not apply route 2 (two) times daily. Use to monitor glucose levels BID; E11.9 (Patient taking differently: 1 each by Does not apply route 2 (two) times daily. E11.9) 1 kit 0   chlorthalidone (HYGROTON) 25 MG tablet Take 12.5 mg by mouth daily.      clobetasol cream (TEMOVATE) 0.05 % Apply 1 Application topically 2 (two) times daily as needed (itching).     cyclobenzaprine (FLEXERIL) 10 MG tablet TAKE 1 TABLET BY MOUTH TWICE A DAY AS NEEDED FOR MUSCLE SPASMS     dexamethasone (DECADRON) 4 MG tablet Take 1 tablet (4 mg total) by mouth 3 (three) times daily. 30 tablet 0   diclofenac Sodium (VOLTAREN ARTHRITIS PAIN) 1 % GEL Apply 4 g topically 4 (four) times daily. (Patient taking differently: Apply 4 g topically 3 (three) times daily as needed (joint pain).) 150 g 0   escitalopram (LEXAPRO) 10 MG tablet Take 1 tablet (10 mg total) by mouth every morning. 90 tablet 3   folic acid (FOLVITE) 1 MG tablet Take 1 tablet (1 mg total) by mouth daily. Start 7 days before pemetrexed chemotherapy. Continue until 21 days after pemetrexed completed. 100 tablet 3   ibuprofen (ADVIL) 800 MG tablet Take 800 mg by mouth every 8 (eight) hours as needed for moderate pain.     insulin glargine (LANTUS SOLOSTAR) 100 UNIT/ML Solostar Pen Inject 15 Units into the skin at bedtime. 15 mL 3   Insulin Pen Needle (B-D ULTRAFINE III SHORT PEN) 31G X 8 MM MISC USE AS DIRECTED 100 each 5   lidocaine-prilocaine (EMLA) cream Apply to affected area once 30 g 3   lisinopril (ZESTRIL) 10 MG tablet Take 10 mg by mouth daily.     meloxicam (MOBIC) 7.5 MG tablet Take 7.5 mg by mouth daily.     memantine (NAMENDA TITRATION PACK) tablet pack 5 mg/day for =1 week; 5 mg twice daily for =1 week; 15 mg/day given in 5 mg and 10 mg separated doses for =1 week; then 10 mg twice daily. Take through 01/20/2024, then stop. 49 tablet 12   metFORMIN (GLUCOPHAGE-XR) 500 MG 24 hr tablet Take 1 tablet (500 mg total) by mouth daily  with breakfast. 90 tablet 3   ondansetron (ZOFRAN) 8 MG tablet Take 1 tablet (8 mg total) by mouth every 8 (eight) hours as needed for nausea or vomiting. Start on the third day after carboplatin. 30 tablet 1   pantoprazole (PROTONIX) 40 MG tablet Take 1 tablet (40 mg total) by mouth 2 (two) times daily. 180 tablet 3   prochlorperazine (COMPAZINE) 10 MG tablet Take 1 tablet (10 mg total) by mouth every 6 (six) hours as needed for nausea or vomiting. 30 tablet 1   tirzepatide (MOUNJARO) 15 MG/0.5ML Pen Inject 15 mg into the skin once a week. 6 mL 3   traZODone (DESYREL) 100 MG tablet Take 2 tablets (200 mg total) by mouth at bedtime. 180 tablet 3   triamcinolone cream (KENALOG) 0.1 % Apply 1 application topically 2 (two) times daily. (Patient taking differently: Apply 1 application  topically 2 (two) times daily as needed (irritation).)  30 g 0   No current facility-administered medications for this visit.    SURGICAL HISTORY:  Past Surgical History:  Procedure Laterality Date   ABDOMINAL HYSTERECTOMY     ABDOMINAL SURGERY     BIOPSY  05/28/2019   Procedure: BIOPSY;  Surgeon: West Bali, MD;  [facility];  Service: Endoscopy;;  random colon/gastric/duodenum   BIOPSY  01/11/2023   Procedure: BIOPSY;  Surgeon: Lanelle Bal, DO;  [facility];  Service: Endoscopy;;   COLONOSCOPY  2005 MAC   TICs, IH   COLONOSCOPY N/A 03/08/2014   Procedure: COLONOSCOPY;  Surgeon: West Bali, MD;  [facility];  Service: Endoscopy;  Laterality: N/A;  1015   COLONOSCOPY WITH PROPOFOL N/A 05/28/2019   Procedure: COLONOSCOPY WITH PROPOFOL;  Surgeon: West Bali, MD;  [facility];  Service: Endoscopy;  Laterality: N/A;  9:15am   CYSTOSTOMY  11/22/2011   Procedure: CYSTOSTOMY SUPRAPUBIC;  Surgeon: Ky Barban, MD;  Location: [facility];  Service: Urology;  Laterality: N/A;   ESOPHAGOGASTRODUODENOSCOPY N/A 03/08/2014   Procedure:  ESOPHAGOGASTRODUODENOSCOPY (EGD);  Surgeon: West Bali, MD;  [facility];  Service: Endoscopy;  Laterality: N/A;   ESOPHAGOGASTRODUODENOSCOPY     ESOPHAGOGASTRODUODENOSCOPY (EGD) WITH PROPOFOL N/A 05/28/2019   Procedure: ESOPHAGOGASTRODUODENOSCOPY (EGD) WITH PROPOFOL;  Surgeon: West Bali, MD;  [facility];  Service: Endoscopy;  Laterality: N/A;   ESOPHAGOGASTRODUODENOSCOPY (EGD) WITH PROPOFOL N/A 01/11/2023   Procedure: ESOPHAGOGASTRODUODENOSCOPY (EGD) WITH PROPOFOL;  Surgeon: Lanelle Bal, DO;  [facility];  Service: Endoscopy;  Laterality: N/A;  9:45 am, asa 3   GIVENS CAPSULE STUDY N/A 06/15/2019   Procedure: GIVENS CAPSULE STUDY;  Surgeon: West Bali, MD;  [facility];  Service: Endoscopy;  Laterality: N/A;  7:30am   HALLUX VALGUS CORRECTION     bilateral foot surgery for bone repairs-multiple   IR IMAGING GUIDED PORT INSERTION  08/19/2023   LOBECTOMY Right 10/15/2016   Procedure: RIGHT UPPER LOBECTOMY;  Surgeon: Loreli Slot, MD;  Location: [facility];  Service: Thoracic;  Laterality: Right;   LYMPH NODE DISSECTION Right 10/15/2016   Procedure: LYMPH NODE DISSECTION;  Surgeon: Loreli Slot, MD;  Location: [facility] OR;  Service: Thoracic;  Laterality: Right;   POLYPECTOMY  05/28/2019   Procedure: POLYPECTOMY;  Surgeon: West Bali, MD;  [facility];  Service: Endoscopy;;   SAVORY DILATION N/A 05/28/2019   Procedure: SAVORY DILATION;  Surgeon: West Bali, MD;  [facility];  Service: Endoscopy;  Laterality: N/A;  15/16/17   VAGINA RECONSTRUCTION SURGERY     tvt 2006- [place]   VIDEO ASSISTED THORACOSCOPY (VATS)/WEDGE RESECTION Right 10/15/2016   Procedure: RIGHT VIDEO ASSISTED THORACOSCOPY (VATS)/WEDGE RESECTION;  Surgeon: Loreli Slot, MD;  Location: [facility];  Service: Thoracic;  Laterality: Right;    REVIEW OF SYSTEMS:  A comprehensive review of systems was negative.    PHYSICAL EXAMINATION: General appearance: alert, cooperative, and no distress Head: Normocephalic, without obvious abnormality, atraumatic Neck: no adenopathy, no JVD, supple, symmetrical, trachea midline, and thyroid not enlarged, symmetric, no tenderness/mass/nodules Lymph nodes: Cervical, supraclavicular, and axillary nodes normal. Resp: clear to auscultation bilaterally Back: symmetric, no curvature. ROM normal. No CVA tenderness. Cardio: regular rate and rhythm, S1, S2 normal, no murmur, click, rub or gallop GI: soft, non-tender; bowel sounds normal; no masses,  no organomegaly Extremities: extremities normal, atraumatic, no cyanosis or edema  ECOG PERFORMANCE STATUS: 0 - Asymptomatic  Blood pressure 123/77, pulse 65, temperature 97.9 F (36.6 C), temperature source Temporal, resp. rate 16, height 5\' 7"  (1.702 m), weight 187 lb 11.2 oz (85.1 kg), SpO2 99%.  LABORATORY DATA: Lab Results  Component Value Date   WBC 6.6 08/21/2023   HGB 11.7 (L) 08/21/2023   HCT 36.8 08/21/2023   MCV 76.2 (L) 08/21/2023   PLT 276 08/21/2023      Chemistry      Component Value Date/Time   NA 139 08/06/2023 0801   NA 142 10/29/2022 1158   NA 141 05/29/2017 0921   K 4.0 08/06/2023 0801   K 4.8 05/29/2017 0921   CL 102 08/06/2023 0801   CO2 31 08/06/2023 0801   CO2 28 05/29/2017 0921   BUN 12 08/06/2023 0801   BUN 21 10/29/2022 1158   BUN 13.5 05/29/2017 0921   CREATININE 0.77 08/06/2023 0801   CREATININE 0.8 05/29/2017 0921      Component Value Date/Time   CALCIUM 9.7 08/06/2023 0801   CALCIUM 9.5 05/29/2017 0921   ALKPHOS 72 08/06/2023 0801   ALKPHOS 104 05/29/2017 0921   AST 12 (L) 08/06/2023 0801   AST 17 05/29/2017 0921   ALT 10 08/06/2023 0801   ALT 20 05/29/2017 0921   BILITOT 0.6 08/06/2023 0801   BILITOT 0.40 05/29/2017 0921       RADIOGRAPHIC STUDIES: IR IMAGING GUIDED PORT INSERTION Result Date: 08/19/2023 INDICATION: Metastatic lung cancer.  Port-A-Cath needed  for treatment. EXAM: FLUOROSCOPIC AND ULTRASOUND GUIDED PLACEMENT OF A SUBCUTANEOUS PORT COMPARISON:  None Available. MEDICATIONS: Moderate sedation ANESTHESIA/SEDATION: Moderate (conscious) sedation was employed during this procedure. A total of Versed 2 mg and fentanyl 100 mcg was administered intravenously at the order of the provider performing the procedure. Total intra-service moderate sedation time: 28 minutes. Patient's level of consciousness and vital signs were monitored continuously by radiology nurse throughout the procedure under the supervision of the provider performing the procedure. FLUOROSCOPY TIME:  Radiation Exposure Index (as provided by the fluoroscopic device): 1 mGy Kerma COMPLICATIONS: None immediate. PROCEDURE: The procedure, risks, benefits, and alternatives were explained to the patient. Questions regarding the procedure were encouraged and answered. The patient understands and consents to the procedure. Patient was placed supine on the interventional table. Ultrasound confirmed a patent right internal jugular vein. Ultrasound image was saved for documentation. The right chest and neck were cleaned with a skin antiseptic and a sterile drape was placed. Maximal barrier sterile technique was utilized including caps, mask, sterile gowns, sterile gloves, sterile drape, hand hygiene and skin antiseptic. The right neck was anesthetized with 1% lidocaine. Small incision was made in the right neck with a blade. Micropuncture set was placed in the right internal jugular vein with ultrasound guidance. The micropuncture wire was used for measurement purposes. The right chest was anesthetized with 1% lidocaine with epinephrine. #15 blade was used to make an incision and a subcutaneous port pocket was formed. 8 french Power Port was assembled. Subcutaneous tunnel was formed with a stiff tunneling device. The port catheter was brought through the subcutaneous tunnel. The port was placed in the  subcutaneous pocket. The micropuncture set was exchanged for a peel-away sheath. The catheter was placed through the peel-away sheath and the tip was positioned at the superior cavoatrial junction. Catheter placement was confirmed with fluoroscopy. The port was accessed and flushed with heparinized saline. The port pocket was closed using two layers of absorbable sutures and Dermabond. The vein  skin site was closed using a single layer of absorbable suture and Dermabond. Sterile dressings were applied. Patient tolerated the procedure well without an immediate complication. Ultrasound and fluoroscopic images were taken and saved for this procedure. IMPRESSION: Placement of a subcutaneous power-injectable port device. Catheter tip at the superior cavoatrial junction. Electronically Signed   By: Richarda Overlie M.D.   On: 08/19/2023 17:32   [place] CORE BIOPSY (LYMPH NODES) Result Date: 08/02/2023 INDICATION: History of lung carcinoma and bilateral hypermetabolic axillary lymphadenopathy by PET scan. EXAM: ULTRASOUND GUIDED CORE BIOPSY OF RIGHT AXILLARY LYMPH NODE MEDICATIONS: None. ANESTHESIA/SEDATION: None PROCEDURE: The procedure, risks, benefits, and alternatives were explained to the patient. Questions regarding the procedure were encouraged and answered. The patient understands and consents to the procedure. A time-out was performed prior to initiating the procedure. The right axilla was prepped with chlorhexidine in a sterile fashion, and a sterile drape was applied covering the operative field. A sterile gown and sterile gloves were used for the procedure. Local anesthesia was provided with 1% Lidocaine. After localizing an enlarged right axillary lymph node, 4 separate 16 gauge core biopsy samples were obtained in different portions of the lymph node under ultrasound guidance. Core biopsy samples were submitted in saline. Additional ultrasound was performed. COMPLICATIONS: None immediate. FINDINGS: Enlarged right  axillary lymph node measures up to approximately 3.5 x 2.3 x 1.8 cm. Solid core biopsy samples were obtained. IMPRESSION: Ultrasound-guided core biopsy performed of an enlarged right axillary lymph node. This corresponds to 1 of the hypermetabolic lymph nodes by PET scan. Electronically Signed   By: Irish Lack M.D.   On: 08/02/2023 15:18   MR Brain W Wo Contrast Result Date: 07/30/2023 CLINICAL DATA:  Metastatic disease evaluation 3T SRS Protocol for radiaiton treatment planning EXAM: MRI HEAD WITHOUT AND WITH CONTRAST TECHNIQUE: Multiplanar, multiecho pulse sequences of the brain and surrounding structures were obtained without and with intravenous contrast. CONTRAST:  8.64mL GADAVIST GADOBUTROL 1 MMOL/ML IV SOLN COMPARISON:  None Available. FINDINGS: Brain: Many of the previously seen enhancing lesions are slightly increased in size. For example, index left posterior frontal lesion measures 8 x 8 mm on series 1100 image 181 (previously 7 x 6 mm). In addition, there are numerous additional punctate enhancing metastasis which were not well characterized on the prior, which could reflect new metastases or metastases that are better characterized due to improved 3 T technique. There are over 50 lesions identified and annotated on series 1100. Edema surrounding some of these lesions without significant mass effect or midline shift. No evidence of acute infarct, acute hemorrhage, or hydrocephalus. Vascular: Major arterial flow voids are maintained at the skull base. Skull and upper cervical spine: Normal marrow signal. Sinuses/Orbits: Clear sinuses.  No acute orbital findings. Other: No mastoid effusions. IMPRESSION: 1. Slightly increased size of the previously identified metastases with numerous punctate enhancing metastases (over 50) that could be new or visible given improved technique (annotated on series 1100). 2. Edema surrounding some of these lesions without mass effect or midline shift. Electronically  Signed   By: Feliberto Harts M.D.   On: 07/30/2023 20:27     ASSESSMENT AND PLAN: This is a very pleasant 68 years old white female with stage IV (T1a, N3, M1 C) non-small cell lung cancer initially diagnosed as stage IA non-small cell lung cancer status post right upper lobectomy with lymph node dissection.  This was diagnosed in March 2018 with evidence for disease recurrence and metastasis in December 2024. The patient has  been on observation and she is feeling fine today with no concerning complaints. The patient had repeat CT scan of the chest performed recently.  I personally independently reviewed the scan images and discussed the results with the patient today.  Unfortunately her scan showed interval development of several abnormal lymph nodes throughout the chest including right hilar, mediastinal, supraclavicular and bilateral axillary as well as upper abdominal lymphadenopathy.  There was also increasing right adrenal nodule worrisome for adrenal metastasis. She underwent ultrasound-guided core biopsy of right axillary lymph node and the final pathology was consistent with metastatic adenocarcinoma of lung primary.  We will send the tissue block to [facility] for molecular studies and PD-L1 expression.  Molecular studies by [id number] showed positive KRAS G12C mutation. She underwent whole brain radiation for brain metastasis. The patient is here today to start the first dose of systemic chemotherapy with carboplatin for AUC of 5, Alimta 500 Mg/M2 and Keytruda 200 Mg IV every 3 weeks.  First dose August 21, 2023.    Stage IV Non-Small Cell Lung Cancer (NSCLC) Diagnosed with stage IV NSCLC with brain metastases in December 2024. Metastasis to the brain treated with whole brain radiation therapy. Currently asymptomatic with no complaints of nausea, vomiting, diarrhea, chest pain, or dyspnea. Hair loss noted, likely secondary to radiation therapy. Initiating systemic chemotherapy today with  Carboplatin, Alimta, and Keytruda. Infusion expected to take approximately three hours. Follow-up planned for next week to assess response to treatment and check laboratory results. Discussed the importance of monitoring blood counts and potential need for dose adjustments based on lab results. - Administer Carboplatin, Alimta, and Keytruda - Schedule follow-up appointment next week to assess treatment response and check labs.   The patient was advised to call immediately if she has any other concerning symptoms in the interval. The patient voices understanding of current disease status and treatment options and is in agreement with the current care plan. All questions were answered. The patient knows to call the clinic with any problems, questions or concerns. We can certainly see the patient much sooner if necessary.  Disclaimer: This note was dictated with voice recognition software. Similar sounding words can inadvertently be transcribed and may not be corrected upon review.

## 2023-08-22 LAB — T4: T4, Total: 9 ug/dL (ref 4.5–12.0)

## 2023-08-23 ENCOUNTER — Other Ambulatory Visit: Payer: Self-pay

## 2023-08-23 ENCOUNTER — Encounter (HOSPITAL_COMMUNITY): Payer: Self-pay | Admitting: Internal Medicine

## 2023-08-23 NOTE — Progress Notes (Unsigned)
[facility] OFFICE PROGRESS NOTE  Koirala, Dibas, MD [address] [place] [address]  DIAGNOSIS:  1) recurrent/metastatic non-small cell lung cancer, adenocarcinoma presented with metastatic adenopathy in the thorax as well as mediastinum, axillary lymph nodes and potential local recurrence at the right hilum along the inferior margin of the resection staple line with bilateral hypermetabolic adrenal metastasis and hypermetabolic bone metastasis to the T11 vertebral body as well as brain metastasis diagnosed in December 2024.   2) Stage IA (T1a, N0, M0) non-small cell lung cancer, adenocarcinoma presented with right upper lobe pulmonary nodule    Detected Alteration(s) / Biomarker(s)Associated FDA-approved therapiesClinical Trial Availability% cfDNA or Amplification KRAS G12C approved by FDA Adagrasib, Sotorasib approved in other indication Adagrasib+cetuximab Yes2.8%   TP53 E204* None Yes2.0%  PRIOR THERAPY: 1) S/P right VATS with right upper lobectomy and mediastinal lymph node dissection under the care of Dr. Dorris Fetch on October 15, 2016. 2) whole brain irradiation under the care of of Dr. Basilio Cairo.  CURRENT THERAPY: Systemic chemotherapy with carboplatin for AUC of 5, Alimta 500 Mg/M2 and Keytruda 200 Mg IV every 3 weeks.  First dose August 20, 2022. Status post 1 cycle.   INTERVAL HISTORY: [name] 68 y.o. female returns to the clinic today for follow-up visit accompanied by ***.  The patient was last seen by Dr. Arbutus Ped last week.  The patient was recently diagnosed with stage IV non-small cell lung cancer.  She completed whole brain radiation and underwent her first cycle of treatment last week and tolerated it ***overall with***any adverse side effects except for***  Today she denies any fever, chills, night sweats, or unexplained weight loss.  Fatigue?  She denies any chest pain, shortness of breath, cough, or hemoptysis.  Denies any  nausea, vomiting, diarrhea, or constipation.  Denies any headache or visual changes.  Denies any rashes or skin changes.  She is here today for evaluation and 1 week follow-up visit to manage any adverse side effects of treatment.     MEDICAL HISTORY: Past Medical History:  Diagnosis Date   Adenocarcinoma of right lung, stage 1 (HCC) 11/29/2016   Cancer (HCC)    Chronic pain    Depression    takes Lexapro daily   Diabetes mellitus    takes Metformin daily and has an insulin pump.Fasting blood sugar runs250   GERD (gastroesophageal reflux disease)    takes Pantoprazole daily   History of colon polyps    benign   Hyperlipidemia    takes Atorvastatin daily   Hypertension    takes Amlodipine and Lisinopril daily   Insomnia    takes Trazodone nightly   Nocturia    Thyroid disease    Urinary frequency    Urinary urgency     ALLERGIES:  is allergic to synthroid [levothyroxine sodium], hydrochlorothiazide, and iodine.  MEDICATIONS:  Current Outpatient Medications  Medication Sig Dispense Refill   ACCU-CHEK AVIVA PLUS test strip USE TO MONITOR GLUCOSE LEVELS TWICE A DAY E11.9 100 strip 12   ACCU-CHEK SOFTCLIX LANCETS lancets Use to monitor glucose levels BID; E11.9 (Patient taking differently: 1 each by Other route 2 (two) times daily. E11.9) 100 each 12   acetaminophen (TYLENOL) 500 MG tablet Take 1 tablet (500 mg total) by mouth every 6 (six) hours as needed. (Patient taking differently: Take 500 mg by mouth every 6 (six) hours as needed (for pain).) 30 tablet 0   atorvastatin (LIPITOR) 20 MG tablet Take 20 mg by mouth  daily.  11   Blood Glucose Calibration (GLUCOSE CONTROL) SOLN 1 Bottle by In Vitro route as needed. Use to calibrate Accu-Chek Aviva Plus device prn; please provide control solution compatible with Accu-Chek Aviva Plus device. (Patient taking differently: 1 each by Other route as needed (Use to calibrate glucometer). E11.9) 1 each 1   Blood Glucose Monitoring Suppl  (ACCU-CHEK AVIVA PLUS) w/Device KIT 1 each by Does not apply route 2 (two) times daily. Use to monitor glucose levels BID; E11.9 (Patient taking differently: 1 each by Does not apply route 2 (two) times daily. E11.9) 1 kit 0   chlorthalidone (HYGROTON) 25 MG tablet Take 12.5 mg by mouth daily.      clobetasol cream (TEMOVATE) 0.05 % Apply 1 Application topically 2 (two) times daily as needed (itching).     cyclobenzaprine (FLEXERIL) 10 MG tablet TAKE 1 TABLET BY MOUTH TWICE A DAY AS NEEDED FOR MUSCLE SPASMS     dexamethasone (DECADRON) 4 MG tablet Take 1 tablet (4 mg total) by mouth 3 (three) times daily. 30 tablet 0   diclofenac Sodium (VOLTAREN ARTHRITIS PAIN) 1 % GEL Apply 4 g topically 4 (four) times daily. (Patient taking differently: Apply 4 g topically 3 (three) times daily as needed (joint pain).) 150 g 0   escitalopram (LEXAPRO) 10 MG tablet Take 1 tablet (10 mg total) by mouth every morning. 90 tablet 3   folic acid (FOLVITE) 1 MG tablet Take 1 tablet (1 mg total) by mouth daily. Start 7 days before pemetrexed chemotherapy. Continue until 21 days after pemetrexed completed. 100 tablet 3   ibuprofen (ADVIL) 800 MG tablet Take 800 mg by mouth every 8 (eight) hours as needed for moderate pain.     insulin glargine (LANTUS SOLOSTAR) 100 UNIT/ML Solostar Pen Inject 15 Units into the skin at bedtime. 15 mL 3   Insulin Pen Needle (B-D ULTRAFINE III SHORT PEN) 31G X 8 MM MISC USE AS DIRECTED 100 each 5   lidocaine-prilocaine (EMLA) cream Apply to affected area once 30 g 3   lisinopril (ZESTRIL) 10 MG tablet Take 10 mg by mouth daily.     meloxicam (MOBIC) 7.5 MG tablet Take 7.5 mg by mouth daily.     memantine (NAMENDA TITRATION PACK) tablet pack 5 mg/day for =1 week; 5 mg twice daily for =1 week; 15 mg/day given in 5 mg and 10 mg separated doses for =1 week; then 10 mg twice daily. Take through 01/20/2024, then stop. 49 tablet 12   metFORMIN (GLUCOPHAGE-XR) 500 MG 24 hr tablet Take 1 tablet (500 mg  total) by mouth daily with breakfast. 90 tablet 3   ondansetron (ZOFRAN) 8 MG tablet Take 1 tablet (8 mg total) by mouth every 8 (eight) hours as needed for nausea or vomiting. Start on the third day after carboplatin. 30 tablet 1   pantoprazole (PROTONIX) 40 MG tablet Take 1 tablet (40 mg total) by mouth 2 (two) times daily. 180 tablet 3   prochlorperazine (COMPAZINE) 10 MG tablet Take 1 tablet (10 mg total) by mouth every 6 (six) hours as needed for nausea or vomiting. 30 tablet 1   tirzepatide (MOUNJARO) 15 MG/0.5ML Pen Inject 15 mg into the skin once a week. 6 mL 3   traZODone (DESYREL) 100 MG tablet Take 2 tablets (200 mg total) by mouth at bedtime. 180 tablet 3   triamcinolone cream (KENALOG) 0.1 % Apply 1 application topically 2 (two) times daily. (Patient taking differently: Apply 1 application  topically 2 (two)  times daily as needed (irritation).) 30 g 0   No current facility-administered medications for this visit.    SURGICAL HISTORY:  Past Surgical History:  Procedure Laterality Date   ABDOMINAL HYSTERECTOMY     ABDOMINAL SURGERY     BIOPSY  05/28/2019   Procedure: BIOPSY;  Surgeon: West Bali, MD;  [facility]: [facility];  Service: Endoscopy;;  random colon/gastric/duodenum   BIOPSY  01/11/2023   Procedure: BIOPSY;  Surgeon: Lanelle Bal, DO;  [facility]: [facility];  Service: Endoscopy;;   COLONOSCOPY  2005 MAC   TICs, IH   COLONOSCOPY N/A 03/08/2014   Procedure: COLONOSCOPY;  Surgeon: West Bali, MD;  [facility]: [facility];  Service: Endoscopy;  Laterality: N/A;  1015   COLONOSCOPY WITH PROPOFOL N/A 05/28/2019   Procedure: COLONOSCOPY WITH PROPOFOL;  Surgeon: West Bali, MD;  [facility]: [facility];  Service: Endoscopy;  Laterality: N/A;  9:15am   CYSTOSTOMY  11/22/2011   Procedure: CYSTOSTOMY SUPRAPUBIC;  Surgeon: Ky Barban, MD;  Location: [facility];  Service: Urology;  Laterality: N/A;   ESOPHAGOGASTRODUODENOSCOPY N/A 03/08/2014   Procedure:  ESOPHAGOGASTRODUODENOSCOPY (EGD);  Surgeon: West Bali, MD;  [facility]: [facility];  Service: Endoscopy;  Laterality: N/A;   ESOPHAGOGASTRODUODENOSCOPY     ESOPHAGOGASTRODUODENOSCOPY (EGD) WITH PROPOFOL N/A 05/28/2019   Procedure: ESOPHAGOGASTRODUODENOSCOPY (EGD) WITH PROPOFOL;  Surgeon: West Bali, MD;  [facility]: [facility];  Service: Endoscopy;  Laterality: N/A;   ESOPHAGOGASTRODUODENOSCOPY (EGD) WITH PROPOFOL N/A 01/11/2023   Procedure: ESOPHAGOGASTRODUODENOSCOPY (EGD) WITH PROPOFOL;  Surgeon: Lanelle Bal, DO;  [facility]: [facility];  Service: Endoscopy;  Laterality: N/A;  9:45 am, asa 3   GIVENS CAPSULE STUDY N/A 06/15/2019   Procedure: GIVENS CAPSULE STUDY;  Surgeon: West Bali, MD;  [facility]: [facility];  Service: Endoscopy;  Laterality: N/A;  7:30am   HALLUX VALGUS CORRECTION     bilateral foot surgery for bone repairs-multiple   IR IMAGING GUIDED PORT INSERTION  08/19/2023   LOBECTOMY Right 10/15/2016   Procedure: RIGHT UPPER LOBECTOMY;  Surgeon: Loreli Slot, MD;  Location: [facility] OR;  Service: Thoracic;  Laterality: Right;   LYMPH NODE DISSECTION Right 10/15/2016   Procedure: LYMPH NODE DISSECTION;  Surgeon: Loreli Slot, MD;  Location: [facility] OR;  Service: Thoracic;  Laterality: Right;   POLYPECTOMY  05/28/2019   Procedure: POLYPECTOMY;  Surgeon: West Bali, MD;  [facility];  Service: Endoscopy;;   SAVORY DILATION N/A 05/28/2019   Procedure: SAVORY DILATION;  Surgeon: West Bali, MD;  [facility]: [facility];  Service: Endoscopy;  Laterality: N/A;  15/16/17   VAGINA RECONSTRUCTION SURGERY     tvt 2006- [place]   VIDEO ASSISTED THORACOSCOPY (VATS)/WEDGE RESECTION Right 10/15/2016   Procedure: RIGHT VIDEO ASSISTED THORACOSCOPY (VATS)/WEDGE RESECTION;  Surgeon: Loreli Slot, MD;  Location: [facility];  Service: Thoracic;  Laterality: Right;    REVIEW OF SYSTEMS:   Review of Systems  Constitutional: Negative for  appetite change, chills, fatigue, fever and unexpected weight change.  HENT:   Negative for mouth sores, nosebleeds, sore throat and trouble swallowing.   Eyes: Negative for eye problems and icterus.  Respiratory: Negative for cough, hemoptysis, shortness of breath and wheezing.   Cardiovascular: Negative for chest pain and leg swelling.  Gastrointestinal: Negative for abdominal pain, constipation, diarrhea, nausea and vomiting.  Genitourinary: Negative for bladder incontinence, difficulty urinating, dysuria, frequency and hematuria.   Musculoskeletal: Negative for back pain,  gait problem, neck pain and neck stiffness.  Skin: Negative for itching and rash.  Neurological: Negative for dizziness, extremity weakness, gait problem, headaches, light-headedness and seizures.  Hematological: Negative for adenopathy. Does not bruise/bleed easily.  Psychiatric/Behavioral: Negative for confusion, depression and sleep disturbance. The patient is not nervous/anxious.     PHYSICAL EXAMINATION:  There were no vitals taken for this visit.  ECOG PERFORMANCE STATUS: {CHL ONC ECOG [id number]  Physical Exam  Constitutional: Oriented to person, place, and time and well-developed, well-nourished, and in no distress. No distress.  HENT:  Head: Normocephalic and atraumatic.  Mouth/Throat: Oropharynx is clear and moist. No oropharyngeal exudate.  Eyes: Conjunctivae are normal. Right eye exhibits no discharge. Left eye exhibits no discharge. No scleral icterus.  Neck: Normal range of motion. Neck supple.  Cardiovascular: Normal rate, regular rhythm, normal heart sounds and intact distal pulses.   Pulmonary/Chest: Effort normal and breath sounds normal. No respiratory distress. No wheezes. No rales.  Abdominal: Soft. Bowel sounds are normal. Exhibits no distension and no mass. There is no tenderness.  Musculoskeletal: Normal range of motion. Exhibits no edema.  Lymphadenopathy:    No cervical adenopathy.   Neurological: Alert and oriented to person, place, and time. Exhibits normal muscle tone. Gait normal. Coordination normal.  Skin: Skin is warm and dry. No rash noted. Not diaphoretic. No erythema. No pallor.  Psychiatric: Mood, memory and judgment normal.  Vitals reviewed.  LABORATORY DATA: Lab Results  Component Value Date   WBC 6.6 08/21/2023   HGB 11.7 (L) 08/21/2023   HCT 36.8 08/21/2023   MCV 76.2 (L) 08/21/2023   PLT 276 08/21/2023      Chemistry      Component Value Date/Time   NA 139 08/21/2023 0903   NA 142 10/29/2022 1158   NA 141 05/29/2017 0921   K 3.8 08/21/2023 0903   K 4.8 05/29/2017 0921   CL 103 08/21/2023 0903   CO2 30 08/21/2023 0903   CO2 28 05/29/2017 0921   BUN 16 08/21/2023 0903   BUN 21 10/29/2022 1158   BUN 13.5 05/29/2017 0921   CREATININE 0.78 08/21/2023 0903   CREATININE 0.8 05/29/2017 0921      Component Value Date/Time   CALCIUM 9.4 08/21/2023 0903   CALCIUM 9.5 05/29/2017 0921   ALKPHOS 63 08/21/2023 0903   ALKPHOS 104 05/29/2017 0921   AST 12 (L) 08/21/2023 0903   AST 17 05/29/2017 0921   ALT 9 08/21/2023 0903   ALT 20 05/29/2017 0921   BILITOT 0.5 08/21/2023 0903   BILITOT 0.40 05/29/2017 0921       RADIOGRAPHIC STUDIES:  IR IMAGING GUIDED PORT INSERTION Result Date: 08/19/2023 INDICATION: Metastatic lung cancer.  Port-A-Cath needed for treatment. EXAM: FLUOROSCOPIC AND ULTRASOUND GUIDED PLACEMENT OF A SUBCUTANEOUS PORT COMPARISON:  None Available. MEDICATIONS: Moderate sedation ANESTHESIA/SEDATION: Moderate (conscious) sedation was employed during this procedure. A total of Versed 2 mg and fentanyl 100 mcg was administered intravenously at the order of the provider performing the procedure. Total intra-service moderate sedation time: 28 minutes. Patient's level of consciousness and vital signs were monitored continuously by radiology nurse throughout the procedure under the supervision of the provider performing the procedure.  FLUOROSCOPY TIME:  Radiation Exposure Index (as provided by the fluoroscopic device): 1 mGy Kerma COMPLICATIONS: None immediate. PROCEDURE: The procedure, risks, benefits, and alternatives were explained to the patient. Questions regarding the procedure were encouraged and answered. The patient understands and consents to the procedure. Patient was placed supine on the  interventional table. Ultrasound confirmed a patent right internal jugular vein. Ultrasound image was saved for documentation. The right chest and neck were cleaned with a skin antiseptic and a sterile drape was placed. Maximal barrier sterile technique was utilized including caps, mask, sterile gowns, sterile gloves, sterile drape, hand hygiene and skin antiseptic. The right neck was anesthetized with 1% lidocaine. Small incision was made in the right neck with a blade. Micropuncture set was placed in the right internal jugular vein with ultrasound guidance. The micropuncture wire was used for measurement purposes. The right chest was anesthetized with 1% lidocaine with epinephrine. #15 blade was used to make an incision and a subcutaneous port pocket was formed. 8 french Power Port was assembled. Subcutaneous tunnel was formed with a stiff tunneling device. The port catheter was brought through the subcutaneous tunnel. The port was placed in the subcutaneous pocket. The micropuncture set was exchanged for a peel-away sheath. The catheter was placed through the peel-away sheath and the tip was positioned at the superior cavoatrial junction. Catheter placement was confirmed with fluoroscopy. The port was accessed and flushed with heparinized saline. The port pocket was closed using two layers of absorbable sutures and Dermabond. The vein skin site was closed using a single layer of absorbable suture and Dermabond. Sterile dressings were applied. Patient tolerated the procedure well without an immediate complication. Ultrasound and fluoroscopic images  were taken and saved for this procedure. IMPRESSION: Placement of a subcutaneous power-injectable port device. Catheter tip at the superior cavoatrial junction. Electronically Signed   By: Richarda Overlie M.D.   On: 08/19/2023 17:32   [place] CORE BIOPSY (LYMPH NODES) Result Date: 08/02/2023 INDICATION: History of lung carcinoma and bilateral hypermetabolic axillary lymphadenopathy by PET scan. EXAM: ULTRASOUND GUIDED CORE BIOPSY OF RIGHT AXILLARY LYMPH NODE MEDICATIONS: None. ANESTHESIA/SEDATION: None PROCEDURE: The procedure, risks, benefits, and alternatives were explained to the patient. Questions regarding the procedure were encouraged and answered. The patient understands and consents to the procedure. A time-out was performed prior to initiating the procedure. The right axilla was prepped with chlorhexidine in a sterile fashion, and a sterile drape was applied covering the operative field. A sterile gown and sterile gloves were used for the procedure. Local anesthesia was provided with 1% Lidocaine. After localizing an enlarged right axillary lymph node, 4 separate 16 gauge core biopsy samples were obtained in different portions of the lymph node under ultrasound guidance. Core biopsy samples were submitted in saline. Additional ultrasound was performed. COMPLICATIONS: None immediate. FINDINGS: Enlarged right axillary lymph node measures up to approximately 3.5 x 2.3 x 1.8 cm. Solid core biopsy samples were obtained. IMPRESSION: Ultrasound-guided core biopsy performed of an enlarged right axillary lymph node. This corresponds to 1 of the hypermetabolic lymph nodes by PET scan. Electronically Signed   By: Irish Lack M.D.   On: 08/02/2023 15:18   MR Brain W Wo Contrast Result Date: 07/30/2023 CLINICAL DATA:  Metastatic disease evaluation 3T SRS Protocol for radiaiton treatment planning EXAM: MRI HEAD WITHOUT AND WITH CONTRAST TECHNIQUE: Multiplanar, multiecho pulse sequences of the brain and surrounding  structures were obtained without and with intravenous contrast. CONTRAST:  8.18mL GADAVIST GADOBUTROL 1 MMOL/ML IV SOLN COMPARISON:  None Available. FINDINGS: Brain: Many of the previously seen enhancing lesions are slightly increased in size. For example, index left posterior frontal lesion measures 8 x 8 mm on series 1100 image 181 (previously 7 x 6 mm). In addition, there are numerous additional punctate enhancing metastasis which were not well characterized  on the prior, which could reflect new metastases or metastases that are better characterized due to improved 3 T technique. There are over 50 lesions identified and annotated on series 1100. Edema surrounding some of these lesions without significant mass effect or midline shift. No evidence of acute infarct, acute hemorrhage, or hydrocephalus. Vascular: Major arterial flow voids are maintained at the skull base. Skull and upper cervical spine: Normal marrow signal. Sinuses/Orbits: Clear sinuses.  No acute orbital findings. Other: No mastoid effusions. IMPRESSION: 1. Slightly increased size of the previously identified metastases with numerous punctate enhancing metastases (over 50) that could be new or visible given improved technique (annotated on series 1100). 2. Edema surrounding some of these lesions without mass effect or midline shift. Electronically Signed   By: Feliberto Harts M.D.   On: 07/30/2023 20:27     ASSESSMENT/PLAN:  This is a very pleasant 68 year old Caucasian female with stage IV (T1a, N3, M1 C) non-small cell lung cancer.  She was initially diagnosed as a stage Ia non-small cell lung cancer, status post right upper lobectomy with lymph node dissection.  She was diagnosed in March 2018.  She was found to have evidence of disease recurrence and metastasis in December 2024.  When she was found to have metastatic disease, she had several abnormal lymph nodes throughout the chest including the hilar, mediastinal, supraclavicular,  bilateral axillary region as well as upper abdominal lymphadenopathy.  There was also an increasing right adrenal nodule worrisome for adrenal metastasis.  She underwent ultrasound-guided biopsy of the axillary lymph node and the final pathology was consistent with metastatic adenocarcinoma of lung primary.  She is positive for K-ras G 12C mutation which can be used in the second line setting.  She also was found to have metastatic disease to the brain and underwent whole brain radiation in ***  The patient underwent her first cycle of systemic chemotherapy with carboplatin for an AUC of 5, Alimta 500 mg/m, Keytruda 200 mg IV every 3 weeks.  She status post 1 cycle and this was started on 08/21/2023.  She tolerated this ***.   Labs were reviewed.  Recommend that she *** We will see her back for follow-up visit in 2 weeks for evaluation repeat blood work before undergoing cycle #2.  The patient was advised to call immediately if she has any concerning symptoms in the interval. The patient voices understanding of current disease status and treatment options and is in agreement with the current care plan. All questions were answered. The patient knows to call the clinic with any problems, questions or concerns. We can certainly see the patient much sooner if necessary      No orders of the defined types were placed in this encounter.    I spent {CHL ONC TIME VISIT - WUXLK:[id number]} counseling the patient face to face. The total time spent in the appointment was {CHL ONC TIME VISIT - GUYQI:[id number]}.  Yaasir Menken L Kaila Devries, PA-C 08/23/23

## 2023-08-25 ENCOUNTER — Other Ambulatory Visit: Payer: Self-pay

## 2023-08-27 ENCOUNTER — Telehealth: Payer: Self-pay

## 2023-08-27 ENCOUNTER — Encounter: Payer: Self-pay | Admitting: Internal Medicine

## 2023-08-27 ENCOUNTER — Inpatient Hospital Stay: Payer: Medicare Other

## 2023-08-28 ENCOUNTER — Inpatient Hospital Stay (HOSPITAL_BASED_OUTPATIENT_CLINIC_OR_DEPARTMENT_OTHER): Payer: Medicare Other | Admitting: Physician Assistant

## 2023-08-28 ENCOUNTER — Inpatient Hospital Stay: Payer: Medicare Other

## 2023-08-28 ENCOUNTER — Encounter: Payer: Self-pay | Admitting: Internal Medicine

## 2023-08-28 VITALS — BP 152/87 | HR 73 | Temp 97.8°F | Resp 17 | Wt 187.4 lb

## 2023-08-28 DIAGNOSIS — R059 Cough, unspecified: Secondary | ICD-10-CM | POA: Diagnosis not present

## 2023-08-28 DIAGNOSIS — C3491 Malignant neoplasm of unspecified part of right bronchus or lung: Secondary | ICD-10-CM

## 2023-08-28 DIAGNOSIS — Z5111 Encounter for antineoplastic chemotherapy: Secondary | ICD-10-CM | POA: Diagnosis not present

## 2023-08-28 DIAGNOSIS — Z95828 Presence of other vascular implants and grafts: Secondary | ICD-10-CM

## 2023-08-28 LAB — CMP (CANCER CENTER ONLY)
ALT: 17 U/L (ref 0–44)
AST: 17 U/L (ref 15–41)
Albumin: 3.8 g/dL (ref 3.5–5.0)
Alkaline Phosphatase: 68 U/L (ref 38–126)
Anion gap: 8 (ref 5–15)
BUN: 17 mg/dL (ref 8–23)
CO2: 28 mmol/L (ref 22–32)
Calcium: 9.4 mg/dL (ref 8.9–10.3)
Chloride: 101 mmol/L (ref 98–111)
Creatinine: 0.61 mg/dL (ref 0.44–1.00)
GFR, Estimated: >60 mL/min (ref >60)
Glucose, Bld: 366 mg/dL — ABNORMAL HIGH (ref 70–99)
Potassium: 4.4 mmol/L (ref 3.5–5.1)
Sodium: 137 mmol/L (ref 135–145)
Total Bilirubin: 0.7 mg/dL (ref 0.0–1.2)
Total Protein: 6.5 g/dL (ref 6.5–8.1)

## 2023-08-28 LAB — CBC WITH DIFFERENTIAL (CANCER CENTER ONLY)
Abs Immature Granulocytes: 0.01 10*3/uL (ref 0.00–0.07)
Basophils Absolute: 0 10*3/uL (ref 0.0–0.1)
Basophils Relative: 0 %
Eosinophils Absolute: 0 10*3/uL (ref 0.0–0.5)
Eosinophils Relative: 1 %
HCT: 34 % — ABNORMAL LOW (ref 36.0–46.0)
Hemoglobin: 11.3 g/dL — ABNORMAL LOW (ref 12.0–15.0)
Immature Granulocytes: 0 %
Lymphocytes Relative: 18 %
Lymphs Abs: 0.6 10*3/uL — ABNORMAL LOW (ref 0.7–4.0)
MCH: 24.4 pg — ABNORMAL LOW (ref 26.0–34.0)
MCHC: 33.2 g/dL (ref 30.0–36.0)
MCV: 73.3 fL — ABNORMAL LOW (ref 80.0–100.0)
Monocytes Absolute: 0.1 10*3/uL (ref 0.1–1.0)
Monocytes Relative: 2 %
Neutro Abs: 2.5 10*3/uL (ref 1.7–7.7)
Neutrophils Relative %: 79 %
Platelet Count: 157 10*3/uL (ref 150–400)
RBC: 4.64 MIL/uL (ref 3.87–5.11)
RDW: 13.1 % (ref 11.5–15.5)
WBC Count: 3.2 10*3/uL — ABNORMAL LOW (ref 4.0–10.5)
nRBC: 0 % (ref 0.0–0.2)

## 2023-08-28 MED ORDER — SODIUM CHLORIDE 0.9% FLUSH
10.0000 mL | Freq: Once | INTRAVENOUS | Status: AC
  Administered 2023-08-28: 10 mL

## 2023-08-28 MED ORDER — BENZONATATE 100 MG PO CAPS: 100.0000 mg | ORAL_CAPSULE | Freq: Three times a day (TID) | ORAL | 2 refills | Status: DC | PRN

## 2023-08-28 MED ORDER — HEPARIN SOD (PORK) LOCK FLUSH 100 UNIT/ML IV SOLN
500.0000 [IU] | Freq: Once | INTRAVENOUS | Status: AC
  Administered 2023-08-28: 500 [IU]

## 2023-08-28 NOTE — Telephone Encounter (Signed)
A PHASE III TRIAL OF STEREOTACTIC RADIOSURGERY COMPARED WITH HIPPOCAMPAL-AVOIDANT WHOLE BRAIN RADIOTHERAPY (HA-WBRT) PLUS MEMANTINE FOR 5 OR MORE BRAIN METASTASES  Spoke with patient and Dr Basilio Cairo regarding following patient for data collection. Both patient and Dr Basilio Cairo agree.   Patient reports having a cold right now.  Dr Basilio Cairo has placed orders and sent messages to set up the 8 week and 4 month follow up MRIs and visits.  Margret Chance Annaliah Rivenbark, RN, BSN, [facility]  Her  [employer] Clinical Research Nurse [facility] Direct Dial [phone] 08/28/2023 2:25 PM

## 2023-09-02 ENCOUNTER — Telehealth: Payer: Self-pay | Admitting: *Deleted

## 2023-09-02 ENCOUNTER — Other Ambulatory Visit: Payer: Self-pay

## 2023-09-02 DIAGNOSIS — C7931 Secondary malignant neoplasm of brain: Secondary | ICD-10-CM

## 2023-09-02 NOTE — Telephone Encounter (Signed)
Called patient to inform of MRI (3 T) for 10-07-23- arrival time- 10:30 am @ [facility], patient to go in Entrance A, no prep for scan, patient also is set up for MRI (3 T) for 12-06-23- arrival time- 11:30 am , patient to go in Entrance A, no prep for scan, spoke with patient and she is aware of these scans and the instructions

## 2023-09-03 ENCOUNTER — Ambulatory Visit: Payer: Medicare Other | Admitting: Radiation Oncology

## 2023-09-03 ENCOUNTER — Inpatient Hospital Stay: Payer: Medicare Other | Attending: Internal Medicine

## 2023-09-03 ENCOUNTER — Other Ambulatory Visit: Payer: Self-pay

## 2023-09-03 DIAGNOSIS — Z5111 Encounter for antineoplastic chemotherapy: Secondary | ICD-10-CM | POA: Diagnosis present

## 2023-09-03 DIAGNOSIS — C7951 Secondary malignant neoplasm of bone: Secondary | ICD-10-CM | POA: Insufficient documentation

## 2023-09-03 DIAGNOSIS — Z791 Long term (current) use of non-steroidal anti-inflammatories (NSAID): Secondary | ICD-10-CM | POA: Diagnosis not present

## 2023-09-03 DIAGNOSIS — K219 Gastro-esophageal reflux disease without esophagitis: Secondary | ICD-10-CM | POA: Insufficient documentation

## 2023-09-03 DIAGNOSIS — Z794 Long term (current) use of insulin: Secondary | ICD-10-CM | POA: Insufficient documentation

## 2023-09-03 DIAGNOSIS — E119 Type 2 diabetes mellitus without complications: Secondary | ICD-10-CM | POA: Insufficient documentation

## 2023-09-03 DIAGNOSIS — R197 Diarrhea, unspecified: Secondary | ICD-10-CM | POA: Diagnosis not present

## 2023-09-03 DIAGNOSIS — Z7985 Long-term (current) use of injectable non-insulin antidiabetic drugs: Secondary | ICD-10-CM | POA: Insufficient documentation

## 2023-09-03 DIAGNOSIS — C3411 Malignant neoplasm of upper lobe, right bronchus or lung: Secondary | ICD-10-CM | POA: Diagnosis not present

## 2023-09-03 DIAGNOSIS — Z860100 Personal history of colon polyps, unspecified: Secondary | ICD-10-CM | POA: Diagnosis not present

## 2023-09-03 DIAGNOSIS — C3491 Malignant neoplasm of unspecified part of right bronchus or lung: Secondary | ICD-10-CM

## 2023-09-03 DIAGNOSIS — E785 Hyperlipidemia, unspecified: Secondary | ICD-10-CM | POA: Diagnosis not present

## 2023-09-03 DIAGNOSIS — I10 Essential (primary) hypertension: Secondary | ICD-10-CM | POA: Diagnosis not present

## 2023-09-03 DIAGNOSIS — Z7952 Long term (current) use of systemic steroids: Secondary | ICD-10-CM | POA: Insufficient documentation

## 2023-09-03 DIAGNOSIS — R110 Nausea: Secondary | ICD-10-CM | POA: Diagnosis not present

## 2023-09-03 DIAGNOSIS — D649 Anemia, unspecified: Secondary | ICD-10-CM | POA: Insufficient documentation

## 2023-09-03 DIAGNOSIS — C7931 Secondary malignant neoplasm of brain: Secondary | ICD-10-CM | POA: Insufficient documentation

## 2023-09-03 DIAGNOSIS — Z7984 Long term (current) use of oral hypoglycemic drugs: Secondary | ICD-10-CM | POA: Diagnosis not present

## 2023-09-03 DIAGNOSIS — Z923 Personal history of irradiation: Secondary | ICD-10-CM | POA: Diagnosis not present

## 2023-09-03 DIAGNOSIS — G4700 Insomnia, unspecified: Secondary | ICD-10-CM | POA: Diagnosis not present

## 2023-09-03 DIAGNOSIS — Z79899 Other long term (current) drug therapy: Secondary | ICD-10-CM | POA: Insufficient documentation

## 2023-09-03 DIAGNOSIS — G8929 Other chronic pain: Secondary | ICD-10-CM | POA: Diagnosis not present

## 2023-09-03 DIAGNOSIS — Z95828 Presence of other vascular implants and grafts: Secondary | ICD-10-CM

## 2023-09-03 DIAGNOSIS — Z5112 Encounter for antineoplastic immunotherapy: Secondary | ICD-10-CM | POA: Insufficient documentation

## 2023-09-03 LAB — CMP (CANCER CENTER ONLY)
ALT: 12 U/L (ref 0–44)
AST: 11 U/L — ABNORMAL LOW (ref 15–41)
Albumin: 3.8 g/dL (ref 3.5–5.0)
Alkaline Phosphatase: 64 U/L (ref 38–126)
Anion gap: 7 (ref 5–15)
BUN: 18 mg/dL (ref 8–23)
CO2: 30 mmol/L (ref 22–32)
Calcium: 9.5 mg/dL (ref 8.9–10.3)
Chloride: 98 mmol/L (ref 98–111)
Creatinine: 1.06 mg/dL — ABNORMAL HIGH (ref 0.44–1.00)
GFR, Estimated: 57 mL/min — ABNORMAL LOW (ref >60)
Glucose, Bld: 219 mg/dL — ABNORMAL HIGH (ref 70–99)
Potassium: 3.9 mmol/L (ref 3.5–5.1)
Sodium: 135 mmol/L (ref 135–145)
Total Bilirubin: 0.5 mg/dL (ref 0.0–1.2)
Total Protein: 6.5 g/dL (ref 6.5–8.1)

## 2023-09-03 LAB — CBC WITH DIFFERENTIAL (CANCER CENTER ONLY)
Abs Immature Granulocytes: 0.03 10*3/uL (ref 0.00–0.07)
Basophils Absolute: 0 10*3/uL (ref 0.0–0.1)
Basophils Relative: 0 %
Eosinophils Absolute: 0 10*3/uL (ref 0.0–0.5)
Eosinophils Relative: 1 %
HCT: 34.1 % — ABNORMAL LOW (ref 36.0–46.0)
Hemoglobin: 11.2 g/dL — ABNORMAL LOW (ref 12.0–15.0)
Immature Granulocytes: 1 %
Lymphocytes Relative: 20 %
Lymphs Abs: 1.1 10*3/uL (ref 0.7–4.0)
MCH: 24.6 pg — ABNORMAL LOW (ref 26.0–34.0)
MCHC: 32.8 g/dL (ref 30.0–36.0)
MCV: 74.8 fL — ABNORMAL LOW (ref 80.0–100.0)
Monocytes Absolute: 0.5 10*3/uL (ref 0.1–1.0)
Monocytes Relative: 9 %
Neutro Abs: 3.9 10*3/uL (ref 1.7–7.7)
Neutrophils Relative %: 69 %
Platelet Count: 170 10*3/uL (ref 150–400)
RBC: 4.56 MIL/uL (ref 3.87–5.11)
RDW: 13.2 % (ref 11.5–15.5)
WBC Count: 5.6 10*3/uL (ref 4.0–10.5)
nRBC: 0 % (ref 0.0–0.2)

## 2023-09-03 MED ORDER — SODIUM CHLORIDE 0.9% FLUSH
10.0000 mL | Freq: Once | INTRAVENOUS | Status: AC
  Administered 2023-09-03: 10 mL

## 2023-09-03 MED ORDER — HEPARIN SOD (PORK) LOCK FLUSH 100 UNIT/ML IV SOLN
500.0000 [IU] | Freq: Once | INTRAVENOUS | Status: AC
Start: 2023-09-03 — End: 2023-09-03
  Administered 2023-09-03: 500 [IU]

## 2023-09-07 NOTE — Progress Notes (Signed)
[facility] OFFICE PROGRESS NOTE  Koirala, Dibas, MD [address] [place] [address]  DIAGNOSIS: 1) recurrent/metastatic non-small cell lung cancer, adenocarcinoma presented with metastatic adenopathy in the thorax as well as mediastinum, axillary lymph nodes and potential local recurrence at the right hilum along the inferior margin of the resection staple line with bilateral hypermetabolic adrenal metastasis and hypermetabolic bone metastasis to the T11 vertebral body as well as brain metastasis diagnosed in December 2024.   2) Stage IA (T1a, N0, M0) non-small cell lung cancer, adenocarcinoma presented with right upper lobe pulmonary nodule    Detected Alteration(s) / Biomarker(s)Associated FDA-approved therapiesClinical Trial Availability% cfDNA or Amplification KRAS G12C approved by FDA Adagrasib, Sotorasib approved in other indication Adagrasib+cetuximab Yes2.8%   TP53 E204* None Yes2.0%  PRIOR THERAPY: 1) S/P right VATS with right upper lobectomy and mediastinal lymph node dissection under the care of Dr. Dorris Fetch on October 15, 2016. 2) whole brain irradiation under the care of of Dr. Basilio Cairo.  CURRENT THERAPY: Systemic chemotherapy with carboplatin for AUC of 5, Alimta 500 Mg/M2 and Keytruda 200 Mg IV every 3 weeks.  First dose August 20, 2022. Status post 1 cycle.   INTERVAL HISTORY: [name] 68 y.o. female returns to the clinic today for a follow-up visit. The patient was last seen by Dr. Arbutus Ped last week.  The patient was recently diagnosed with stage IV non-small cell lung cancer.  She completed whole brain radiation on 08/16/23 and underwent her first cycle of treatment 3 weeks ago and tolerated it well she had some mild and expected fatigue and somewhat diminished appetite. She lost a few pounds.   At her last appointment, her main concern was that she had been struggling with a cold and was treated by her PCP with antibiotics and  steroids. She also had an X-ray performed by her PCP which was negative for infection.  I gave her a prescription for tessalon for her cough at her last appointment. Overall, her cough is lingering somewhat but "not as bad as it was before" and she is feeling "pretty good" overall.   Today she denies any fever, chills, or night sweats. She denies any chest pain, shortness of breath, or hemoptysis. She had mild nausea with cycle #1 but not enough to require taking her anti-emetic. She had some diarrhea with cycle #1. She has imodium at home if needed. She denies any usual bowel habits at this time. Denies any headache or visual changes. Denies any rashes or skin changes. She is here for evaluation and repeat blood work before cycle #2.     MEDICAL HISTORY: Past Medical History:  Diagnosis Date   Adenocarcinoma of right lung, stage 1 (HCC) 11/29/2016   Cancer (HCC)    Chronic pain    Depression    takes Lexapro daily   Diabetes mellitus    takes Metformin daily and has an insulin pump.Fasting blood sugar runs250   GERD (gastroesophageal reflux disease)    takes Pantoprazole daily   History of colon polyps    benign   Hyperlipidemia    takes Atorvastatin daily   Hypertension    takes Amlodipine and Lisinopril daily   Insomnia    takes Trazodone nightly   Nocturia    Thyroid disease    Urinary frequency    Urinary urgency     ALLERGIES:  is allergic to synthroid [levothyroxine sodium], hydrochlorothiazide, and iodine.  MEDICATIONS:  Current Outpatient Medications  Medication Sig Dispense  Refill   ACCU-CHEK AVIVA PLUS test strip USE TO MONITOR GLUCOSE LEVELS TWICE A DAY E11.9 100 strip 12   ACCU-CHEK SOFTCLIX LANCETS lancets Use to monitor glucose levels BID; E11.9 (Patient taking differently: 1 each by Other route 2 (two) times daily. E11.9) 100 each 12   acetaminophen (TYLENOL) 500 MG tablet Take 1 tablet (500 mg total) by mouth every 6 (six) hours as needed. (Patient taking  differently: Take 500 mg by mouth every 6 (six) hours as needed (for pain).) 30 tablet 0   atorvastatin (LIPITOR) 20 MG tablet Take 20 mg by mouth daily.  11   azithromycin (ZITHROMAX) 250 MG tablet Take 250 mg by mouth daily.     benzonatate (TESSALON) 100 MG capsule Take 1 capsule (100 mg total) by mouth 3 (three) times daily as needed. 30 capsule 2   Blood Glucose Calibration (GLUCOSE CONTROL) SOLN 1 Bottle by In Vitro route as needed. Use to calibrate Accu-Chek Aviva Plus device prn; please provide control solution compatible with Accu-Chek Aviva Plus device. (Patient taking differently: 1 each by Other route as needed (Use to calibrate glucometer). E11.9) 1 each 1   Blood Glucose Monitoring Suppl (ACCU-CHEK AVIVA PLUS) w/Device KIT 1 each by Does not apply route 2 (two) times daily. Use to monitor glucose levels BID; E11.9 (Patient taking differently: 1 each by Does not apply route 2 (two) times daily. E11.9) 1 kit 0   chlorthalidone (HYGROTON) 25 MG tablet Take 12.5 mg by mouth daily.      clobetasol cream (TEMOVATE) 0.05 % Apply 1 Application topically 2 (two) times daily as needed (itching).     cyclobenzaprine (FLEXERIL) 10 MG tablet TAKE 1 TABLET BY MOUTH TWICE A DAY AS NEEDED FOR MUSCLE SPASMS     dexamethasone (DECADRON) 4 MG tablet Take 1 tablet (4 mg total) by mouth 3 (three) times daily. 30 tablet 0   diclofenac Sodium (VOLTAREN ARTHRITIS PAIN) 1 % GEL Apply 4 g topically 4 (four) times daily. (Patient taking differently: Apply 4 g topically 3 (three) times daily as needed (joint pain).) 150 g 0   escitalopram (LEXAPRO) 10 MG tablet Take 1 tablet (10 mg total) by mouth every morning. 90 tablet 3   folic acid (FOLVITE) 1 MG tablet Take 1 tablet (1 mg total) by mouth daily. Start 7 days before pemetrexed chemotherapy. Continue until 21 days after pemetrexed completed. 100 tablet 3   ibuprofen (ADVIL) 800 MG tablet Take 800 mg by mouth every 8 (eight) hours as needed for moderate pain.      insulin glargine (LANTUS SOLOSTAR) 100 UNIT/ML Solostar Pen Inject 15 Units into the skin at bedtime. 15 mL 3   Insulin Pen Needle (B-D ULTRAFINE III SHORT PEN) 31G X 8 MM MISC USE AS DIRECTED 100 each 5   lidocaine-prilocaine (EMLA) cream Apply to affected area once 30 g 3   lisinopril (ZESTRIL) 10 MG tablet Take 10 mg by mouth daily.     meloxicam (MOBIC) 7.5 MG tablet Take 7.5 mg by mouth daily.     memantine (NAMENDA TITRATION PACK) tablet pack 5 mg/day for =1 week; 5 mg twice daily for =1 week; 15 mg/day given in 5 mg and 10 mg separated doses for =1 week; then 10 mg twice daily. Take through 01/20/2024, then stop. 49 tablet 12   metFORMIN (GLUCOPHAGE-XR) 500 MG 24 hr tablet Take 1 tablet (500 mg total) by mouth daily with breakfast. 90 tablet 3   ondansetron (ZOFRAN) 8 MG tablet Take 1  tablet (8 mg total) by mouth every 8 (eight) hours as needed for nausea or vomiting. Start on the third day after carboplatin. 30 tablet 1   pantoprazole (PROTONIX) 40 MG tablet Take 1 tablet (40 mg total) by mouth 2 (two) times daily. 180 tablet 3   predniSONE (DELTASONE) 10 MG tablet Take 10 mg by mouth daily with breakfast.     prochlorperazine (COMPAZINE) 10 MG tablet Take 1 tablet (10 mg total) by mouth every 6 (six) hours as needed for nausea or vomiting. 30 tablet 1   tirzepatide (MOUNJARO) 15 MG/0.5ML Pen Inject 15 mg into the skin once a week. 6 mL 3   traZODone (DESYREL) 100 MG tablet Take 2 tablets (200 mg total) by mouth at bedtime. 180 tablet 3   triamcinolone cream (KENALOG) 0.1 % Apply 1 application topically 2 (two) times daily. (Patient taking differently: Apply 1 application  topically 2 (two) times daily as needed (irritation).) 30 g 0   No current facility-administered medications for this visit.    SURGICAL HISTORY:  Past Surgical History:  Procedure Laterality Date   ABDOMINAL HYSTERECTOMY     ABDOMINAL SURGERY     BIOPSY  05/28/2019   Procedure: BIOPSY;  Surgeon: West Bali, MD;   [facility];  Service: Endoscopy;;  random colon/gastric/duodenum   BIOPSY  01/11/2023   Procedure: BIOPSY;  Surgeon: Lanelle Bal, DO;  [facility];  Service: Endoscopy;;   COLONOSCOPY  2005 MAC   TICs, IH   COLONOSCOPY N/A 03/08/2014   Procedure: COLONOSCOPY;  Surgeon: West Bali, MD;  [facility]: [facility];  Service: Endoscopy;  Laterality: N/A;  1015   COLONOSCOPY WITH PROPOFOL N/A 05/28/2019   Procedure: COLONOSCOPY WITH PROPOFOL;  Surgeon: West Bali, MD;  [facility];  Service: Endoscopy;  Laterality: N/A;  9:15am   CYSTOSTOMY  11/22/2011   Procedure: CYSTOSTOMY SUPRAPUBIC;  Surgeon: Ky Barban, MD;  Location: [facility];  Service: Urology;  Laterality: N/A;   ESOPHAGOGASTRODUODENOSCOPY N/A 03/08/2014   Procedure: ESOPHAGOGASTRODUODENOSCOPY (EGD);  Surgeon: West Bali, MD;  [facility];  Service: Endoscopy;  Laterality: N/A;   ESOPHAGOGASTRODUODENOSCOPY     ESOPHAGOGASTRODUODENOSCOPY (EGD) WITH PROPOFOL N/A 05/28/2019   Procedure: ESOPHAGOGASTRODUODENOSCOPY (EGD) WITH PROPOFOL;  Surgeon: West Bali, MD;  [facility];  Service: Endoscopy;  Laterality: N/A;   ESOPHAGOGASTRODUODENOSCOPY (EGD) WITH PROPOFOL N/A 01/11/2023   Procedure: ESOPHAGOGASTRODUODENOSCOPY (EGD) WITH PROPOFOL;  Surgeon: Lanelle Bal, DO;  [facility];  Service: Endoscopy;  Laterality: N/A;  9:45 am, asa 3   GIVENS CAPSULE STUDY N/A 06/15/2019   Procedure: GIVENS CAPSULE STUDY;  Surgeon: West Bali, MD;  [facility];  Service: Endoscopy;  Laterality: N/A;  7:30am   HALLUX VALGUS CORRECTION     bilateral foot surgery for bone repairs-multiple   IR IMAGING GUIDED PORT INSERTION  08/19/2023   LOBECTOMY Right 10/15/2016   Procedure: RIGHT UPPER LOBECTOMY;  Surgeon: Loreli Slot, MD;  Location: [facility];  Service: Thoracic;  Laterality: Right;   LYMPH NODE DISSECTION Right 10/15/2016   Procedure: LYMPH  NODE DISSECTION;  Surgeon: Loreli Slot, MD;  Location: [facility] OR;  Service: Thoracic;  Laterality: Right;   POLYPECTOMY  05/28/2019   Procedure: POLYPECTOMY;  Surgeon: West Bali, MD;  [facility];  Service: Endoscopy;;   SAVORY DILATION N/A 05/28/2019   Procedure: SAVORY DILATION;  Surgeon: West Bali, MD;  [facility];  Service: Endoscopy;  Laterality: N/A;  15/16/17   VAGINA RECONSTRUCTION SURGERY     tvt 2006- [place]   VIDEO ASSISTED THORACOSCOPY (VATS)/WEDGE RESECTION Right 10/15/2016   Procedure: RIGHT VIDEO ASSISTED THORACOSCOPY (VATS)/WEDGE RESECTION;  Surgeon: Loreli Slot, MD;  Location: [facility];  Service: Thoracic;  Laterality: Right;    REVIEW OF SYSTEMS:   Review of Systems  Constitutional: Positive for mild fatigue and weight loss. Negative for appetite change, chills, and fever.   HENT: Negative for mouth sores, nosebleeds, sore throat and trouble swallowing.   Eyes: Negative for eye problems and icterus.  Respiratory: Positive for improved but lingering cough. Negative for hemoptysis, shortness of breath and wheezing.   Cardiovascular: Negative for chest pain and leg swelling.  Gastrointestinal: Negative for abdominal pain, constipation, diarrhea, nausea and vomiting.  Genitourinary: Negative for bladder incontinence, difficulty urinating, dysuria, frequency and hematuria.   Musculoskeletal: Negative for back pain, gait problem, neck pain and neck stiffness.  Skin: Negative for itching and rash.  Neurological: Negative for dizziness, extremity weakness, gait problem, headaches, light-headedness and seizures.  Hematological: Negative for adenopathy. Does not bruise/bleed easily.  Psychiatric/Behavioral: Negative for confusion, depression and sleep disturbance. The patient is not nervous/anxious.     PHYSICAL EXAMINATION:  Blood pressure (!) 154/57, pulse 84, temperature 97.6 F (36.4 C), temperature source Temporal, resp.  rate 16, weight 183 lb (83 kg), SpO2 97%.  ECOG PERFORMANCE STATUS: 1  Physical Exam  Constitutional: Oriented to person, place, and time and well-developed, well-nourished, and in no distress.  HENT:  Head: Normocephalic and atraumatic.  Mouth/Throat: Oropharynx is clear and moist. No oropharyngeal exudate.  Eyes: Conjunctivae are normal. Right eye exhibits no discharge. Left eye exhibits no discharge. No scleral icterus.  Neck: Normal range of motion. Neck supple.  Cardiovascular: Normal rate, regular rhythm, normal heart sounds and intact distal pulses.   Pulmonary/Chest: Effort normal and breath sounds normal. No respiratory distress. No wheezes. No rales.  Abdominal: Soft. Bowel sounds are normal. Exhibits no distension and no mass. There is no tenderness.  Musculoskeletal: Normal range of motion. Exhibits no edema.  Lymphadenopathy:    No cervical adenopathy.  Neurological: Alert and oriented to person, place, and time. Exhibits normal muscle tone. Gait normal. Coordination normal.  Skin: Skin is warm and dry. No rash noted. Not diaphoretic. No erythema. No pallor.  Psychiatric: Mood, memory and judgment normal.  Vitals reviewed.  LABORATORY DATA: Lab Results  Component Value Date   WBC 4.2 09/11/2023   HGB 10.7 (L) 09/11/2023   HCT 32.9 (L) 09/11/2023   MCV 76.9 (L) 09/11/2023   PLT 315 09/11/2023      Chemistry      Component Value Date/Time   NA 138 09/11/2023 1109   NA 142 10/29/2022 1158   NA 141 05/29/2017 0921   K 3.6 09/11/2023 1109   K 4.8 05/29/2017 0921   CL 101 09/11/2023 1109   CO2 31 09/11/2023 1109   CO2 28 05/29/2017 0921   BUN 13 09/11/2023 1109   BUN 21 10/29/2022 1158   BUN 13.5 05/29/2017 0921   CREATININE 0.70 09/11/2023 1109   CREATININE 0.8 05/29/2017 0921      Component Value Date/Time   CALCIUM 9.3 09/11/2023 1109   CALCIUM 9.5 05/29/2017 0921   ALKPHOS 65 09/11/2023 1109   ALKPHOS 104 05/29/2017 0921   AST 12 (L) 09/11/2023 1109    AST 17 05/29/2017 0921   ALT 9 09/11/2023 1109  ALT 20 05/29/2017 0921   BILITOT 0.4 09/11/2023 1109   BILITOT 0.40 05/29/2017 0921       RADIOGRAPHIC STUDIES:  IR IMAGING GUIDED PORT INSERTION Result Date: 08/19/2023 INDICATION: Metastatic lung cancer.  Port-A-Cath needed for treatment. EXAM: FLUOROSCOPIC AND ULTRASOUND GUIDED PLACEMENT OF A SUBCUTANEOUS PORT COMPARISON:  None Available. MEDICATIONS: Moderate sedation ANESTHESIA/SEDATION: Moderate (conscious) sedation was employed during this procedure. A total of Versed 2 mg and fentanyl 100 mcg was administered intravenously at the order of the provider performing the procedure. Total intra-service moderate sedation time: 28 minutes. Patient's level of consciousness and vital signs were monitored continuously by radiology nurse throughout the procedure under the supervision of the provider performing the procedure. FLUOROSCOPY TIME:  Radiation Exposure Index (as provided by the fluoroscopic device): 1 mGy Kerma COMPLICATIONS: None immediate. PROCEDURE: The procedure, risks, benefits, and alternatives were explained to the patient. Questions regarding the procedure were encouraged and answered. The patient understands and consents to the procedure. Patient was placed supine on the interventional table. Ultrasound confirmed a patent right internal jugular vein. Ultrasound image was saved for documentation. The right chest and neck were cleaned with a skin antiseptic and a sterile drape was placed. Maximal barrier sterile technique was utilized including caps, mask, sterile gowns, sterile gloves, sterile drape, hand hygiene and skin antiseptic. The right neck was anesthetized with 1% lidocaine. Small incision was made in the right neck with a blade. Micropuncture set was placed in the right internal jugular vein with ultrasound guidance. The micropuncture wire was used for measurement purposes. The right chest was anesthetized with 1% lidocaine with  epinephrine. #15 blade was used to make an incision and a subcutaneous port pocket was formed. 8 french Power Port was assembled. Subcutaneous tunnel was formed with a stiff tunneling device. The port catheter was brought through the subcutaneous tunnel. The port was placed in the subcutaneous pocket. The micropuncture set was exchanged for a peel-away sheath. The catheter was placed through the peel-away sheath and the tip was positioned at the superior cavoatrial junction. Catheter placement was confirmed with fluoroscopy. The port was accessed and flushed with heparinized saline. The port pocket was closed using two layers of absorbable sutures and Dermabond. The vein skin site was closed using a single layer of absorbable suture and Dermabond. Sterile dressings were applied. Patient tolerated the procedure well without an immediate complication. Ultrasound and fluoroscopic images were taken and saved for this procedure. IMPRESSION: Placement of a subcutaneous power-injectable port device. Catheter tip at the superior cavoatrial junction. Electronically Signed   By: Richarda Overlie M.D.   On: 08/19/2023 17:32     ASSESSMENT/PLAN:  This is a very pleasant 68 year old Caucasian female with stage IV (T1a, N3, M1 C) non-small cell lung cancer.  She was initially diagnosed as a stage Ia non-small cell lung cancer, status post right upper lobectomy with lymph node dissection.  She was diagnosed in March 2018.  She was found to have evidence of disease recurrence and metastasis in December 2024.   When she was found to have metastatic disease, she had several abnormal lymph nodes throughout the chest including the hilar, mediastinal, supraclavicular, bilateral axillary region as well as upper abdominal lymphadenopathy.  There was also an increasing right adrenal nodule worrisome for adrenal metastasis.   She underwent ultrasound-guided biopsy of the axillary lymph node and the final pathology was consistent with  metastatic adenocarcinoma of lung primary.  She is positive for K-ras G 12C mutation which can be used  in the second line setting.   She also was found to have metastatic disease to the brain and underwent whole brain radiation in 08/16/23   The patient underwent her first cycle of systemic chemotherapy with carboplatin for an AUC of 5, Alimta 500 mg/m, Keytruda 200 mg IV every 3 weeks.  She status post 1 cycle and this was started on 08/21/2023.   She tolerated this well without any significant adverse side effects except mild fatigue and decreased appetite.    Labs were reviewed which are acceptable. She will proceed with cycle #2 today as scheduled.   We will see her back in 3 weeks before undergoing cycle #3.   We talked about increasing her protein intake and protein supplemental drinks. We also discussed small frequent meals.   We will continue to monitor her anemia. We also discussed taking an iron supplement given her low MCV and I can add on iron studies on her next set of labs.   The patient was advised to call immediately if she has any concerning symptoms in the interval. The patient voices understanding of current disease status and treatment options and is in agreement with the current care plan. All questions were answered. The patient knows to call the clinic with any problems, questions or concerns. We can certainly see the patient much sooner if necessary  No orders of the defined types were placed in this encounter. The total time spent in the appointment was 20-29 minutes.   Molly Hadlock L Wrangler Penning, PA-C 09/11/23

## 2023-09-10 MED FILL — Fosaprepitant Dimeglumine For IV Infusion 150 MG (Base Eq): INTRAVENOUS | Qty: 5 | Status: AC

## 2023-09-11 ENCOUNTER — Inpatient Hospital Stay (HOSPITAL_BASED_OUTPATIENT_CLINIC_OR_DEPARTMENT_OTHER): Payer: Medicare Other | Admitting: Physician Assistant

## 2023-09-11 ENCOUNTER — Other Ambulatory Visit: Payer: Self-pay | Admitting: Internal Medicine

## 2023-09-11 ENCOUNTER — Inpatient Hospital Stay: Payer: Medicare Other

## 2023-09-11 VITALS — BP 154/57 | HR 84 | Temp 97.6°F | Resp 16 | Wt 183.0 lb

## 2023-09-11 DIAGNOSIS — C3491 Malignant neoplasm of unspecified part of right bronchus or lung: Secondary | ICD-10-CM

## 2023-09-11 DIAGNOSIS — Z5112 Encounter for antineoplastic immunotherapy: Secondary | ICD-10-CM | POA: Diagnosis not present

## 2023-09-11 DIAGNOSIS — Z5111 Encounter for antineoplastic chemotherapy: Secondary | ICD-10-CM

## 2023-09-11 DIAGNOSIS — Z95828 Presence of other vascular implants and grafts: Secondary | ICD-10-CM

## 2023-09-11 LAB — CMP (CANCER CENTER ONLY)
ALT: 9 U/L (ref 0–44)
AST: 12 U/L — ABNORMAL LOW (ref 15–41)
Albumin: 3.7 g/dL (ref 3.5–5.0)
Alkaline Phosphatase: 65 U/L (ref 38–126)
Anion gap: 6 (ref 5–15)
BUN: 13 mg/dL (ref 8–23)
CO2: 31 mmol/L (ref 22–32)
Calcium: 9.3 mg/dL (ref 8.9–10.3)
Chloride: 101 mmol/L (ref 98–111)
Creatinine: 0.7 mg/dL (ref 0.44–1.00)
GFR, Estimated: >60 mL/min (ref >60)
Glucose, Bld: 164 mg/dL — ABNORMAL HIGH (ref 70–99)
Potassium: 3.6 mmol/L (ref 3.5–5.1)
Sodium: 138 mmol/L (ref 135–145)
Total Bilirubin: 0.4 mg/dL (ref 0.0–1.2)
Total Protein: 6.7 g/dL (ref 6.5–8.1)

## 2023-09-11 LAB — CBC WITH DIFFERENTIAL (CANCER CENTER ONLY)
Abs Immature Granulocytes: 0.02 10*3/uL (ref 0.00–0.07)
Basophils Absolute: 0 10*3/uL (ref 0.0–0.1)
Basophils Relative: 1 %
Eosinophils Absolute: 0.1 10*3/uL (ref 0.0–0.5)
Eosinophils Relative: 1 %
HCT: 32.9 % — ABNORMAL LOW (ref 36.0–46.0)
Hemoglobin: 10.7 g/dL — ABNORMAL LOW (ref 12.0–15.0)
Immature Granulocytes: 1 %
Lymphocytes Relative: 27 %
Lymphs Abs: 1.1 10*3/uL (ref 0.7–4.0)
MCH: 25 pg — ABNORMAL LOW (ref 26.0–34.0)
MCHC: 32.5 g/dL (ref 30.0–36.0)
MCV: 76.9 fL — ABNORMAL LOW (ref 80.0–100.0)
Monocytes Absolute: 0.5 10*3/uL (ref 0.1–1.0)
Monocytes Relative: 12 %
Neutro Abs: 2.5 10*3/uL (ref 1.7–7.7)
Neutrophils Relative %: 58 %
Platelet Count: 315 10*3/uL (ref 150–400)
RBC: 4.28 MIL/uL (ref 3.87–5.11)
RDW: 15.2 % (ref 11.5–15.5)
WBC Count: 4.2 10*3/uL (ref 4.0–10.5)
nRBC: 0 % (ref 0.0–0.2)

## 2023-09-11 MED ORDER — FOSAPREPITANT DIMEGLUMINE INJECTION 150 MG
150.0000 mg | Freq: Once | INTRAVENOUS | Status: AC
  Administered 2023-09-11: 150 mg via INTRAVENOUS
  Filled 2023-09-11: qty 150

## 2023-09-11 MED ORDER — SODIUM CHLORIDE 0.9% FLUSH
10.0000 mL | INTRAVENOUS | Status: DC | PRN
  Administered 2023-09-11: 10 mL

## 2023-09-11 MED ORDER — SODIUM CHLORIDE 0.9 % IV SOLN
488.0000 mg | Freq: Once | INTRAVENOUS | Status: AC
  Administered 2023-09-11: 490 mg via INTRAVENOUS
  Filled 2023-09-11: qty 49

## 2023-09-11 MED ORDER — PEMBROLIZUMAB CHEMO INJECTION 100 MG/4ML
200.0000 mg | Freq: Once | INTRAVENOUS | Status: AC
  Administered 2023-09-11: 200 mg via INTRAVENOUS
  Filled 2023-09-11: qty 200

## 2023-09-11 MED ORDER — PALONOSETRON HCL INJECTION 0.25 MG/5ML
0.2500 mg | Freq: Once | INTRAVENOUS | Status: AC
  Administered 2023-09-11: 0.25 mg via INTRAVENOUS
  Filled 2023-09-11: qty 5

## 2023-09-11 MED ORDER — HEPARIN SOD (PORK) LOCK FLUSH 100 UNIT/ML IV SOLN
500.0000 [IU] | Freq: Once | INTRAVENOUS | Status: AC | PRN
  Administered 2023-09-11: 500 [IU]

## 2023-09-11 MED ORDER — SODIUM CHLORIDE 0.9 % IV SOLN: INTRAVENOUS | Status: DC

## 2023-09-11 MED ORDER — SODIUM CHLORIDE 0.9 % IV SOLN
500.0000 mg/m2 | Freq: Once | INTRAVENOUS | Status: AC
  Administered 2023-09-11: 1000 mg via INTRAVENOUS
  Filled 2023-09-11: qty 40

## 2023-09-11 MED ORDER — SODIUM CHLORIDE 0.9% FLUSH
10.0000 mL | Freq: Once | INTRAVENOUS | Status: AC
  Administered 2023-09-11: 10 mL

## 2023-09-11 MED ORDER — DEXAMETHASONE SODIUM PHOSPHATE 10 MG/ML IJ SOLN
10.0000 mg | Freq: Once | INTRAMUSCULAR | Status: AC
  Administered 2023-09-11: 10 mg via INTRAVENOUS
  Filled 2023-09-11: qty 1

## 2023-09-11 NOTE — Addendum Note (Signed)
Addended by: Mickie Hillier on: 09/11/2023 12:53 PM   Modules accepted: Orders

## 2023-09-11 NOTE — Patient Instructions (Signed)
[facility] - A DEPT OF [facility]  Discharge Instructions: Thank you for choosing [facility] to provide your oncology and hematology care.   If you have a lab appointment with the Cancer Center, please go directly to the Cancer Center and check in at the registration area.   Wear comfortable clothing and clothing appropriate for easy access to any Portacath or PICC line.   We strive to give you quality time with your provider. You may need to reschedule your appointment if you arrive late (15 or more minutes).  Arriving late affects you and other patients whose appointments are after yours.  Also, if you miss three or more appointments without notifying the office, you may be dismissed from the clinic at the provider's discretion.      For prescription refill requests, have your pharmacy contact our office and allow 72 hours for refills to be completed.    Today you received the following chemotherapy and/or immunotherapy agents keytruda, alimta, carboplatin      To help prevent nausea and vomiting after your treatment, we encourage you to take your nausea medication as directed.  BELOW ARE SYMPTOMS THAT SHOULD BE REPORTED IMMEDIATELY: *FEVER GREATER THAN 100.4 F (38 C) OR HIGHER *CHILLS OR SWEATING *NAUSEA AND VOMITING THAT IS NOT CONTROLLED WITH YOUR NAUSEA MEDICATION *UNUSUAL SHORTNESS OF BREATH *UNUSUAL BRUISING OR BLEEDING *URINARY PROBLEMS (pain or burning when urinating, or frequent urination) *BOWEL PROBLEMS (unusual diarrhea, constipation, pain near the anus) TENDERNESS IN MOUTH AND THROAT WITH OR WITHOUT PRESENCE OF ULCERS (sore throat, sores in mouth, or a toothache) UNUSUAL RASH, SWELLING OR PAIN  UNUSUAL VAGINAL DISCHARGE OR ITCHING   Items with * indicate a potential emergency and should be followed up as soon as possible or go to the Emergency Department if any problems should occur.  Please show the CHEMOTHERAPY ALERT  CARD or IMMUNOTHERAPY ALERT CARD at check-in to the Emergency Department and triage nurse.  Should you have questions after your visit or need to cancel or reschedule your appointment, please contact [facility] - [facility]  Dept: [phone]  and follow the prompts.  Office hours are 8:00 a.m. to 4:30 p.m. Monday - Friday. Please note that voicemails left after 4:00 p.m. may not be returned until the following business day.  We are closed weekends and major holidays. You have access to a nurse at all times for urgent questions. Please call the main number to the clinic Dept: [phone] and follow the prompts.   For any non-urgent questions, you may also contact your provider using MyChart. We now offer e-Visits for anyone 69 and older to request care online for non-urgent symptoms. For details visit [email or web address].   Also download the MyChart app! Go to the app store, search "MyChart", open the app, select [facility], and log in with your MyChart username and password.

## 2023-09-17 ENCOUNTER — Telehealth: Payer: Self-pay

## 2023-09-17 ENCOUNTER — Encounter: Payer: Self-pay | Admitting: Internal Medicine

## 2023-09-17 ENCOUNTER — Inpatient Hospital Stay: Payer: Medicare Other

## 2023-09-17 ENCOUNTER — Other Ambulatory Visit: Payer: Self-pay | Admitting: Physician Assistant

## 2023-09-17 DIAGNOSIS — C3491 Malignant neoplasm of unspecified part of right bronchus or lung: Secondary | ICD-10-CM

## 2023-09-17 DIAGNOSIS — Z5111 Encounter for antineoplastic chemotherapy: Secondary | ICD-10-CM | POA: Diagnosis not present

## 2023-09-17 DIAGNOSIS — R638 Other symptoms and signs concerning food and fluid intake: Secondary | ICD-10-CM

## 2023-09-17 DIAGNOSIS — Z95828 Presence of other vascular implants and grafts: Secondary | ICD-10-CM

## 2023-09-17 LAB — CMP (CANCER CENTER ONLY)
ALT: 15 U/L (ref 0–44)
AST: 16 U/L (ref 15–41)
Albumin: 3.7 g/dL (ref 3.5–5.0)
Alkaline Phosphatase: 70 U/L (ref 38–126)
Anion gap: 6 (ref 5–15)
BUN: 20 mg/dL (ref 8–23)
CO2: 31 mmol/L (ref 22–32)
Calcium: 9.3 mg/dL (ref 8.9–10.3)
Chloride: 99 mmol/L (ref 98–111)
Creatinine: 0.81 mg/dL (ref 0.44–1.00)
GFR, Estimated: >60 mL/min (ref >60)
Glucose, Bld: 219 mg/dL — ABNORMAL HIGH (ref 70–99)
Potassium: 3.7 mmol/L (ref 3.5–5.1)
Sodium: 136 mmol/L (ref 135–145)
Total Bilirubin: 0.6 mg/dL (ref 0.0–1.2)
Total Protein: 6.3 g/dL — ABNORMAL LOW (ref 6.5–8.1)

## 2023-09-17 LAB — CBC WITH DIFFERENTIAL (CANCER CENTER ONLY)
Abs Immature Granulocytes: 0.01 10*3/uL (ref 0.00–0.07)
Basophils Absolute: 0 10*3/uL (ref 0.0–0.1)
Basophils Relative: 1 %
Eosinophils Absolute: 0 10*3/uL (ref 0.0–0.5)
Eosinophils Relative: 1 %
HCT: 31.2 % — ABNORMAL LOW (ref 36.0–46.0)
Hemoglobin: 10.4 g/dL — ABNORMAL LOW (ref 12.0–15.0)
Immature Granulocytes: 0 %
Lymphocytes Relative: 26 %
Lymphs Abs: 0.8 10*3/uL (ref 0.7–4.0)
MCH: 25.1 pg — ABNORMAL LOW (ref 26.0–34.0)
MCHC: 33.3 g/dL (ref 30.0–36.0)
MCV: 75.4 fL — ABNORMAL LOW (ref 80.0–100.0)
Monocytes Absolute: 0.1 10*3/uL (ref 0.1–1.0)
Monocytes Relative: 2 %
Neutro Abs: 2 10*3/uL (ref 1.7–7.7)
Neutrophils Relative %: 70 %
Platelet Count: 170 10*3/uL (ref 150–400)
RBC: 4.14 MIL/uL (ref 3.87–5.11)
RDW: 14.8 % (ref 11.5–15.5)
WBC Count: 2.9 10*3/uL — ABNORMAL LOW (ref 4.0–10.5)
nRBC: 0 % (ref 0.0–0.2)

## 2023-09-17 MED ORDER — SODIUM CHLORIDE 0.9% FLUSH
10.0000 mL | Freq: Once | INTRAVENOUS | Status: AC
  Administered 2023-09-17: 10 mL

## 2023-09-17 MED ORDER — HEPARIN SOD (PORK) LOCK FLUSH 100 UNIT/ML IV SOLN
500.0000 [IU] | Freq: Once | INTRAVENOUS | Status: AC
Start: 2023-09-17 — End: 2023-09-17
  Administered 2023-09-17: 500 [IU]

## 2023-09-17 NOTE — Telephone Encounter (Signed)
Spoke with patient this afternoon. Patient stated that she hasn't ate much in the last 4 days.  Patient states that she is only able to get down about 1/2 of an ensure and water, every time she tries to eat real food, she chews it up and her body tells her to spit it back out.  Patient was nauseous over the weekend and took compazine for relief. Patient stated that she is going to try to eat cereal tonight.  Patient denies any trouble swallowing. Last treatment on 2/12. Informed patient I would relay message to [place], [place] and call her with further information.

## 2023-09-17 NOTE — Progress Notes (Signed)
Pt. Here for port flush/Labs.  States she is having difficult getting food down.  Loss of appetite.  She's able to take in 1/3- 1/2 of Ensure or Boost Daily.  Takes in 16 oz. Of water daily.  Secure chatted Cassie/NP, Diane/RN and Jessica Clayton/RD.  Shanda Bumps states with put in a [employer] referral.  Pt. Aware and informed her someone will give her a call

## 2023-09-17 NOTE — Telephone Encounter (Signed)
Per [place], PA- Patient will benefit from IVF.  Patient lives in [place], [place] and with the weather coming in, patient has agreed to get IVF's at the [facility] per her friend Greggory Stallion.  Appt made for 815. Patients friend Greggory Stallion verbalized understanding.

## 2023-09-18 ENCOUNTER — Inpatient Hospital Stay: Payer: Medicare Other

## 2023-09-18 DIAGNOSIS — Z5111 Encounter for antineoplastic chemotherapy: Secondary | ICD-10-CM | POA: Diagnosis not present

## 2023-09-18 DIAGNOSIS — R638 Other symptoms and signs concerning food and fluid intake: Secondary | ICD-10-CM

## 2023-09-18 MED ORDER — SODIUM CHLORIDE 0.9% FLUSH
10.0000 mL | INTRAVENOUS | Status: DC | PRN
  Administered 2023-09-18: 10 mL via INTRAVENOUS

## 2023-09-18 MED ORDER — HEPARIN SOD (PORK) LOCK FLUSH 100 UNIT/ML IV SOLN
500.0000 [IU] | Freq: Once | INTRAVENOUS | Status: AC
  Administered 2023-09-18: 500 [IU] via INTRAVENOUS

## 2023-09-18 MED ORDER — SODIUM CHLORIDE 0.9 % IV SOLN: Freq: Once | INTRAVENOUS | Status: AC

## 2023-09-18 NOTE — Patient Instructions (Signed)
[facility] - A DEPT OF [facility]  Discharge Instructions: Thank you for choosing [facility] to provide your oncology and hematology care.  If you have a lab appointment with the Cancer Center - please note that after April 8th, 2024, all labs will be drawn in the cancer center.  You do not have to check in or register with the main entrance as you have in the past but will complete your check-in in the cancer center.  Wear comfortable clothing and clothing appropriate for easy access to any Portacath or PICC line.   We strive to give you quality time with your provider. You may need to reschedule your appointment if you arrive late (15 or more minutes).  Arriving late affects you and other patients whose appointments are after yours.  Also, if you miss three or more appointments without notifying the office, you may be dismissed from the clinic at the provider's discretion.      For prescription refill requests, have your pharmacy contact our office and allow 72 hours for refills to be completed.    Today you received the following: Hydration fluids    To help prevent nausea and vomiting after your treatment, we encourage you to take your nausea medication as directed.  BELOW ARE SYMPTOMS THAT SHOULD BE REPORTED IMMEDIATELY: *FEVER GREATER THAN 100.4 F (38 C) OR HIGHER *CHILLS OR SWEATING *NAUSEA AND VOMITING THAT IS NOT CONTROLLED WITH YOUR NAUSEA MEDICATION *UNUSUAL SHORTNESS OF BREATH *UNUSUAL BRUISING OR BLEEDING *URINARY PROBLEMS (pain or burning when urinating, or frequent urination) *BOWEL PROBLEMS (unusual diarrhea, constipation, pain near the anus) TENDERNESS IN MOUTH AND THROAT WITH OR WITHOUT PRESENCE OF ULCERS (sore throat, sores in mouth, or a toothache) UNUSUAL RASH, SWELLING OR PAIN  UNUSUAL VAGINAL DISCHARGE OR ITCHING   Items with * indicate a potential emergency and should be followed up as soon as possible or go to the  Emergency Department if any problems should occur.  Please show the CHEMOTHERAPY ALERT CARD or IMMUNOTHERAPY ALERT CARD at check-in to the Emergency Department and triage nurse.  Should you have questions after your visit or need to cancel or reschedule your appointment, please contact [facility] [place] - A DEPT OF [facility] [phone]  and follow the prompts.  Office hours are 8:00 a.m. to 4:30 p.m. Monday - Friday. Please note that voicemails left after 4:00 p.m. may not be returned until the following business day.  We are closed weekends and major holidays. You have access to a nurse at all times for urgent questions. Please call the main number to the clinic [phone] and follow the prompts.  For any non-urgent questions, you may also contact your provider using MyChart. We now offer e-Visits for anyone 51 and older to request care online for non-urgent symptoms. For details visit [email or web address].   Also download the MyChart app! Go to the app store, search "MyChart", open the app, select [facility], and log in with your MyChart username and password.

## 2023-09-18 NOTE — Progress Notes (Signed)
Hydration fluids given per orders. Patient tolerated it well without problems. Vitals stable and discharged home from clinic ambulatory. Follow up as scheduled.  

## 2023-09-19 NOTE — Addendum Note (Signed)
Encounter addended by: Edward Qualia on: 09/19/2023 10:35 AM  Actions taken: Imaging Exam ended

## 2023-09-20 ENCOUNTER — Ambulatory Visit: Payer: Self-pay | Admitting: Radiation Oncology

## 2023-09-21 NOTE — Progress Notes (Signed)
 Updated pemetrexed ERX to [id number]   Pryor Ochoa, PharmD 09/21/23

## 2023-09-23 ENCOUNTER — Telehealth: Payer: Self-pay | Admitting: Medical Oncology

## 2023-09-23 NOTE — Telephone Encounter (Signed)
 IVF-Returned call about setting up IVF> She did get them last week at [facility] and if she needs IVF that is where she wants to go. She feels fine now  and will call back if she is not able to take in oral fluids , feels dehydrated.

## 2023-09-24 ENCOUNTER — Inpatient Hospital Stay: Payer: Medicare Other

## 2023-09-24 ENCOUNTER — Telehealth: Payer: Self-pay | Admitting: Medical Oncology

## 2023-09-24 DIAGNOSIS — Z95828 Presence of other vascular implants and grafts: Secondary | ICD-10-CM

## 2023-09-24 DIAGNOSIS — C3491 Malignant neoplasm of unspecified part of right bronchus or lung: Secondary | ICD-10-CM

## 2023-09-24 DIAGNOSIS — Z5111 Encounter for antineoplastic chemotherapy: Secondary | ICD-10-CM | POA: Diagnosis not present

## 2023-09-24 LAB — CBC WITH DIFFERENTIAL (CANCER CENTER ONLY)
Abs Immature Granulocytes: 0.01 10*3/uL (ref 0.00–0.07)
Basophils Absolute: 0 10*3/uL (ref 0.0–0.1)
Basophils Relative: 0 %
Eosinophils Absolute: 0 10*3/uL (ref 0.0–0.5)
Eosinophils Relative: 1 %
HCT: 28 % — ABNORMAL LOW (ref 36.0–46.0)
Hemoglobin: 9.2 g/dL — ABNORMAL LOW (ref 12.0–15.0)
Immature Granulocytes: 0 %
Lymphocytes Relative: 35 %
Lymphs Abs: 1 10*3/uL (ref 0.7–4.0)
MCH: 24.7 pg — ABNORMAL LOW (ref 26.0–34.0)
MCHC: 32.9 g/dL (ref 30.0–36.0)
MCV: 75.1 fL — ABNORMAL LOW (ref 80.0–100.0)
Monocytes Absolute: 0.5 10*3/uL (ref 0.1–1.0)
Monocytes Relative: 16 %
Neutro Abs: 1.4 10*3/uL — ABNORMAL LOW (ref 1.7–7.7)
Neutrophils Relative %: 48 %
Platelet Count: 93 10*3/uL — ABNORMAL LOW (ref 150–400)
RBC: 3.73 MIL/uL — ABNORMAL LOW (ref 3.87–5.11)
RDW: 14.6 % (ref 11.5–15.5)
WBC Count: 2.9 10*3/uL — ABNORMAL LOW (ref 4.0–10.5)
nRBC: 0 % (ref 0.0–0.2)

## 2023-09-24 LAB — CMP (CANCER CENTER ONLY)
ALT: 13 U/L (ref 0–44)
AST: 16 U/L (ref 15–41)
Albumin: 3.6 g/dL (ref 3.5–5.0)
Alkaline Phosphatase: 63 U/L (ref 38–126)
Anion gap: 6 (ref 5–15)
BUN: 6 mg/dL — ABNORMAL LOW (ref 8–23)
CO2: 28 mmol/L (ref 22–32)
Calcium: 9 mg/dL (ref 8.9–10.3)
Chloride: 106 mmol/L (ref 98–111)
Creatinine: 0.68 mg/dL (ref 0.44–1.00)
GFR, Estimated: >60 mL/min (ref >60)
Glucose, Bld: 147 mg/dL — ABNORMAL HIGH (ref 70–99)
Potassium: 3.7 mmol/L (ref 3.5–5.1)
Sodium: 140 mmol/L (ref 135–145)
Total Bilirubin: 0.3 mg/dL (ref 0.0–1.2)
Total Protein: 6.3 g/dL — ABNORMAL LOW (ref 6.5–8.1)

## 2023-09-24 MED ORDER — HEPARIN SOD (PORK) LOCK FLUSH 100 UNIT/ML IV SOLN
500.0000 [IU] | Freq: Once | INTRAVENOUS | Status: AC
  Administered 2023-09-24: 500 [IU]

## 2023-09-24 MED ORDER — SODIUM CHLORIDE 0.9% FLUSH
10.0000 mL | Freq: Once | INTRAVENOUS | Status: AC
  Administered 2023-09-24: 10 mL

## 2023-09-24 NOTE — Telephone Encounter (Signed)
 IVF prn- Deneise Lever , RN @ [facility] said they can give her IVF on an prn basis. Please ask Dr. Arbutus Ped to enter a supportive plan for IV hydration.

## 2023-09-25 ENCOUNTER — Other Ambulatory Visit: Payer: Self-pay

## 2023-09-26 ENCOUNTER — Other Ambulatory Visit: Payer: Medicare Other

## 2023-09-27 NOTE — Telephone Encounter (Addendum)
 LVM that per Dr. Arbutus Ped ,if you  can drink on your own no need for IVF . I told pt to call if she is  unable to take in oral fluids adequately , having nausea /vomiting, lightheaded.

## 2023-10-01 MED FILL — Fosaprepitant Dimeglumine For IV Infusion 150 MG (Base Eq): INTRAVENOUS | Qty: 5 | Status: AC

## 2023-10-02 ENCOUNTER — Inpatient Hospital Stay: Payer: Medicare Other | Admitting: Dietician

## 2023-10-02 ENCOUNTER — Inpatient Hospital Stay (HOSPITAL_BASED_OUTPATIENT_CLINIC_OR_DEPARTMENT_OTHER): Payer: Medicare Other

## 2023-10-02 ENCOUNTER — Ambulatory Visit: Payer: Self-pay | Admitting: Radiation Oncology

## 2023-10-02 ENCOUNTER — Inpatient Hospital Stay: Payer: Medicare Other | Attending: Internal Medicine | Admitting: Internal Medicine

## 2023-10-02 ENCOUNTER — Inpatient Hospital Stay: Payer: Medicare Other

## 2023-10-02 VITALS — BP 130/85 | HR 58 | Temp 98.0°F | Resp 17 | Ht 67.0 in | Wt 178.0 lb

## 2023-10-02 DIAGNOSIS — Z860100 Personal history of colon polyps, unspecified: Secondary | ICD-10-CM | POA: Insufficient documentation

## 2023-10-02 DIAGNOSIS — D61818 Other pancytopenia: Secondary | ICD-10-CM | POA: Insufficient documentation

## 2023-10-02 DIAGNOSIS — E861 Hypovolemia: Secondary | ICD-10-CM | POA: Insufficient documentation

## 2023-10-02 DIAGNOSIS — Z794 Long term (current) use of insulin: Secondary | ICD-10-CM | POA: Diagnosis not present

## 2023-10-02 DIAGNOSIS — C3411 Malignant neoplasm of upper lobe, right bronchus or lung: Secondary | ICD-10-CM | POA: Insufficient documentation

## 2023-10-02 DIAGNOSIS — E785 Hyperlipidemia, unspecified: Secondary | ICD-10-CM | POA: Insufficient documentation

## 2023-10-02 DIAGNOSIS — Z7984 Long term (current) use of oral hypoglycemic drugs: Secondary | ICD-10-CM | POA: Diagnosis not present

## 2023-10-02 DIAGNOSIS — Z791 Long term (current) use of non-steroidal anti-inflammatories (NSAID): Secondary | ICD-10-CM | POA: Insufficient documentation

## 2023-10-02 DIAGNOSIS — Z9641 Presence of insulin pump (external) (internal): Secondary | ICD-10-CM | POA: Insufficient documentation

## 2023-10-02 DIAGNOSIS — C3491 Malignant neoplasm of unspecified part of right bronchus or lung: Secondary | ICD-10-CM | POA: Diagnosis not present

## 2023-10-02 DIAGNOSIS — Z95828 Presence of other vascular implants and grafts: Secondary | ICD-10-CM

## 2023-10-02 DIAGNOSIS — G8929 Other chronic pain: Secondary | ICD-10-CM | POA: Insufficient documentation

## 2023-10-02 DIAGNOSIS — E119 Type 2 diabetes mellitus without complications: Secondary | ICD-10-CM | POA: Diagnosis not present

## 2023-10-02 DIAGNOSIS — T451X5A Adverse effect of antineoplastic and immunosuppressive drugs, initial encounter: Secondary | ICD-10-CM | POA: Diagnosis not present

## 2023-10-02 DIAGNOSIS — R110 Nausea: Secondary | ICD-10-CM | POA: Insufficient documentation

## 2023-10-02 DIAGNOSIS — Z7985 Long-term (current) use of injectable non-insulin antidiabetic drugs: Secondary | ICD-10-CM | POA: Diagnosis not present

## 2023-10-02 DIAGNOSIS — Z87891 Personal history of nicotine dependence: Secondary | ICD-10-CM | POA: Insufficient documentation

## 2023-10-02 DIAGNOSIS — Z79899 Other long term (current) drug therapy: Secondary | ICD-10-CM | POA: Diagnosis not present

## 2023-10-02 DIAGNOSIS — G4700 Insomnia, unspecified: Secondary | ICD-10-CM | POA: Diagnosis not present

## 2023-10-02 DIAGNOSIS — Z7952 Long term (current) use of systemic steroids: Secondary | ICD-10-CM | POA: Diagnosis not present

## 2023-10-02 DIAGNOSIS — Z5111 Encounter for antineoplastic chemotherapy: Secondary | ICD-10-CM | POA: Diagnosis present

## 2023-10-02 DIAGNOSIS — C7931 Secondary malignant neoplasm of brain: Secondary | ICD-10-CM | POA: Insufficient documentation

## 2023-10-02 DIAGNOSIS — I10 Essential (primary) hypertension: Secondary | ICD-10-CM | POA: Diagnosis not present

## 2023-10-02 LAB — CBC WITH DIFFERENTIAL (CANCER CENTER ONLY)
Abs Immature Granulocytes: 0.02 10*3/uL (ref 0.00–0.07)
Basophils Absolute: 0 10*3/uL (ref 0.0–0.1)
Basophils Relative: 1 %
Eosinophils Absolute: 0 10*3/uL (ref 0.0–0.5)
Eosinophils Relative: 1 %
HCT: 29.8 % — ABNORMAL LOW (ref 36.0–46.0)
Hemoglobin: 9.8 g/dL — ABNORMAL LOW (ref 12.0–15.0)
Immature Granulocytes: 1 %
Lymphocytes Relative: 23 %
Lymphs Abs: 0.5 10*3/uL — ABNORMAL LOW (ref 0.7–4.0)
MCH: 25.3 pg — ABNORMAL LOW (ref 26.0–34.0)
MCHC: 32.9 g/dL (ref 30.0–36.0)
MCV: 77 fL — ABNORMAL LOW (ref 80.0–100.0)
Monocytes Absolute: 0.6 10*3/uL (ref 0.1–1.0)
Monocytes Relative: 32 %
Neutro Abs: 0.9 10*3/uL — ABNORMAL LOW (ref 1.7–7.7)
Neutrophils Relative %: 42 %
Platelet Count: 270 10*3/uL (ref 150–400)
RBC: 3.87 MIL/uL (ref 3.87–5.11)
RDW: 17.9 % — ABNORMAL HIGH (ref 11.5–15.5)
WBC Count: 2 10*3/uL — ABNORMAL LOW (ref 4.0–10.5)
nRBC: 0 % (ref 0.0–0.2)

## 2023-10-02 LAB — CMP (CANCER CENTER ONLY)
ALT: 14 U/L (ref 0–44)
AST: 33 U/L (ref 15–41)
Albumin: 3.6 g/dL (ref 3.5–5.0)
Alkaline Phosphatase: 59 U/L (ref 38–126)
Anion gap: 6 (ref 5–15)
BUN: 10 mg/dL (ref 8–23)
CO2: 28 mmol/L (ref 22–32)
Calcium: 8.6 mg/dL — ABNORMAL LOW (ref 8.9–10.3)
Chloride: 101 mmol/L (ref 98–111)
Creatinine: 0.68 mg/dL (ref 0.44–1.00)
GFR, Estimated: >60 mL/min (ref >60)
Glucose, Bld: 188 mg/dL — ABNORMAL HIGH (ref 70–99)
Potassium: 3.6 mmol/L (ref 3.5–5.1)
Sodium: 135 mmol/L (ref 135–145)
Total Bilirubin: 0.4 mg/dL (ref 0.0–1.2)
Total Protein: 6.5 g/dL (ref 6.5–8.1)

## 2023-10-02 LAB — TSH: TSH: 2.489 u[IU]/mL (ref 0.350–4.500)

## 2023-10-02 MED ORDER — HYDROCODONE BIT-HOMATROP MBR 5-1.5 MG/5ML PO SOLN: 5.0000 mL | Freq: Four times a day (QID) | ORAL | 0 refills | Status: DC | PRN

## 2023-10-02 MED ORDER — SODIUM CHLORIDE 0.9% FLUSH
10.0000 mL | Freq: Once | INTRAVENOUS | Status: AC
  Administered 2023-10-02: 10 mL

## 2023-10-02 NOTE — Progress Notes (Signed)
 [facility] Telephone:([phone]   Fax:([phone]) [phone]  OFFICE PROGRESS NOTE  Koirala, Dibas, MD [address] [place] [address]  DIAGNOSIS:  1) recurrent/metastatic non-small cell lung cancer, adenocarcinoma presented with metastatic adenopathy in the thorax as well as mediastinum, axillary lymph nodes and potential local recurrence at the right hilum along the inferior margin of the resection staple line with bilateral hypermetabolic adrenal metastasis and hypermetabolic bone metastasis to the T11 vertebral body as well as brain metastasis diagnosed in December 2024.  2) Stage IA (T1a, N0, M0) non-small cell lung cancer, adenocarcinoma presented with right upper lobe pulmonary nodule   Detected Alteration(s) / Biomarker(s) Associated FDA-approved therapies Clinical Trial Availability % cfDNA or Amplification KRAS G12C approved by FDA Adagrasib, Sotorasib approved in other indication Adagrasib+cetuximab Yes 2.8%  TP53 E204* None Yes 2.0%   PRIOR THERAPY:  1) S/P right VATS with right upper lobectomy and mediastinal lymph node dissection under the care of Dr. Dorris Fetch on October 15, 2016. 2) whole brain irradiation under the care of of Dr. Basilio Cairo.  CURRENT THERAPY: Systemic chemotherapy with carboplatin for AUC of 5, Alimta 500 Mg/M2 and Keytruda 200 Mg IV every 3 weeks.  First dose August 20, 2022.  INTERVAL HISTORY: [name] 68 y.o. female returns to the clinic today for annual follow-up visit accompanied by one of her neighbor. Discussed the use of AI scribe software for clinical note transcription with the patient, who gave verbal consent to proceed.  History of Present Illness   [name] is a 68 year old female with metastatic non-small cell lung cancer adenocarcinoma who presents with a persistent dry cough. She is accompanied by her neighbor.  She has a history of metastatic non-small cell lung cancer  adenocarcinoma, diagnosed in December 2024, with a positive KRAS G12C mutation. She completed whole brain radiation and is currently undergoing chemotherapy. She has not received radiation to the chest, only to the brain. She takes folic acid daily as part of her treatment regimen.  She presents with a persistent dry cough that she describes as 'driving her crazy.' She has been managing the cough with DayQuil, which helps, and another unspecified medication. Her neighbor notes that the cough worsens at night, leading to coughing attacks. No hemoptysis is reported.  She experiences fatigue as a side effect of her treatment. Her recent complete blood count (CBC) shows a low white blood cell count of 2.0, with an absolute neutrophil count of 0.9, and a hemoglobin level of 9.8, which is an improvement from a previous level of 9.2. Her platelets are within normal range at 270. No nausea or vomiting is reported.        MEDICAL HISTORY: Past Medical History:  Diagnosis Date   Adenocarcinoma of right lung, stage 1 (HCC) 11/29/2016   Cancer (HCC)    Chronic pain    Depression    takes Lexapro daily   Diabetes mellitus    takes Metformin daily and has an insulin pump.Fasting blood sugar runs250   GERD (gastroesophageal reflux disease)    takes Pantoprazole daily   History of colon polyps    benign   Hyperlipidemia    takes Atorvastatin daily   Hypertension    takes Amlodipine and Lisinopril daily   Insomnia    takes Trazodone nightly   Nocturia    Thyroid disease    Urinary frequency    Urinary urgency     ALLERGIES:  is  allergic to synthroid [levothyroxine sodium], hydrochlorothiazide, and iodine.  MEDICATIONS:  Current Outpatient Medications  Medication Sig Dispense Refill   ACCU-CHEK AVIVA PLUS test strip USE TO MONITOR GLUCOSE LEVELS TWICE A DAY E11.9 100 strip 12   ACCU-CHEK SOFTCLIX LANCETS lancets Use to monitor glucose levels BID; E11.9 (Patient taking differently: 1 each by  Other route 2 (two) times daily. E11.9) 100 each 12   acetaminophen (TYLENOL) 500 MG tablet Take 1 tablet (500 mg total) by mouth every 6 (six) hours as needed. (Patient taking differently: Take 500 mg by mouth every 6 (six) hours as needed (for pain).) 30 tablet 0   atorvastatin (LIPITOR) 20 MG tablet Take 20 mg by mouth daily.  11   azithromycin (ZITHROMAX) 250 MG tablet Take 250 mg by mouth daily.     benzonatate (TESSALON) 100 MG capsule Take 1 capsule (100 mg total) by mouth 3 (three) times daily as needed. 30 capsule 2   Blood Glucose Calibration (GLUCOSE CONTROL) SOLN 1 Bottle by In Vitro route as needed. Use to calibrate Accu-Chek Aviva Plus device prn; please provide control solution compatible with Accu-Chek Aviva Plus device. (Patient taking differently: 1 each by Other route as needed (Use to calibrate glucometer). E11.9) 1 each 1   Blood Glucose Monitoring Suppl (ACCU-CHEK AVIVA PLUS) w/Device KIT 1 each by Does not apply route 2 (two) times daily. Use to monitor glucose levels BID; E11.9 (Patient taking differently: 1 each by Does not apply route 2 (two) times daily. E11.9) 1 kit 0   chlorthalidone (HYGROTON) 25 MG tablet Take 12.5 mg by mouth daily.      clobetasol cream (TEMOVATE) 0.05 % Apply 1 Application topically 2 (two) times daily as needed (itching).     cyclobenzaprine (FLEXERIL) 10 MG tablet TAKE 1 TABLET BY MOUTH TWICE A DAY AS NEEDED FOR MUSCLE SPASMS     dexamethasone (DECADRON) 4 MG tablet TAKE 1 TABLET BY MOUTH 3 TIMES DAILY. 30 tablet 0   diclofenac Sodium (VOLTAREN ARTHRITIS PAIN) 1 % GEL Apply 4 g topically 4 (four) times daily. (Patient taking differently: Apply 4 g topically 3 (three) times daily as needed (joint pain).) 150 g 0   escitalopram (LEXAPRO) 10 MG tablet Take 1 tablet (10 mg total) by mouth every morning. 90 tablet 3   folic acid (FOLVITE) 1 MG tablet Take 1 tablet (1 mg total) by mouth daily. Start 7 days before pemetrexed chemotherapy. Continue until 21  days after pemetrexed completed. 100 tablet 3   ibuprofen (ADVIL) 800 MG tablet Take 800 mg by mouth every 8 (eight) hours as needed for moderate pain.     insulin glargine (LANTUS SOLOSTAR) 100 UNIT/ML Solostar Pen Inject 15 Units into the skin at bedtime. 15 mL 3   Insulin Pen Needle (B-D ULTRAFINE III SHORT PEN) 31G X 8 MM MISC USE AS DIRECTED 100 each 5   lidocaine-prilocaine (EMLA) cream Apply to affected area once 30 g 3   lisinopril (ZESTRIL) 10 MG tablet Take 10 mg by mouth daily.     meloxicam (MOBIC) 7.5 MG tablet Take 7.5 mg by mouth daily.     memantine (NAMENDA TITRATION PACK) tablet pack 5 mg/day for =1 week; 5 mg twice daily for =1 week; 15 mg/day given in 5 mg and 10 mg separated doses for =1 week; then 10 mg twice daily. Take through 01/20/2024, then stop. 49 tablet 12   metFORMIN (GLUCOPHAGE-XR) 500 MG 24 hr tablet Take 1 tablet (500 mg total) by mouth daily  with breakfast. 90 tablet 3   ondansetron (ZOFRAN) 8 MG tablet Take 1 tablet (8 mg total) by mouth every 8 (eight) hours as needed for nausea or vomiting. Start on the third day after carboplatin. 30 tablet 1   pantoprazole (PROTONIX) 40 MG tablet Take 1 tablet (40 mg total) by mouth 2 (two) times daily. 180 tablet 3   predniSONE (DELTASONE) 10 MG tablet Take 10 mg by mouth daily with breakfast.     prochlorperazine (COMPAZINE) 10 MG tablet Take 1 tablet (10 mg total) by mouth every 6 (six) hours as needed for nausea or vomiting. 30 tablet 1   tirzepatide (MOUNJARO) 15 MG/0.5ML Pen Inject 15 mg into the skin once a week. 6 mL 3   traZODone (DESYREL) 100 MG tablet Take 2 tablets (200 mg total) by mouth at bedtime. 180 tablet 3   triamcinolone cream (KENALOG) 0.1 % Apply 1 application topically 2 (two) times daily. (Patient taking differently: Apply 1 application  topically 2 (two) times daily as needed (irritation).) 30 g 0   No current facility-administered medications for this visit.    SURGICAL HISTORY:  Past Surgical  History:  Procedure Laterality Date   ABDOMINAL HYSTERECTOMY     ABDOMINAL SURGERY     BIOPSY  05/28/2019   Procedure: BIOPSY;  Surgeon: West Bali, MD;  Location: [facility];  Service: Endoscopy;;  random colon/gastric/duodenum   BIOPSY  01/11/2023   Procedure: BIOPSY;  Surgeon: Lanelle Bal, DO;  Location: [facility];  Service: Endoscopy;;   COLONOSCOPY  2005 MAC   TICs, IH   COLONOSCOPY N/A 03/08/2014   Procedure: COLONOSCOPY;  Surgeon: West Bali, MD;  Location: [facility];  Service: Endoscopy;  Laterality: N/A;  1015   COLONOSCOPY WITH PROPOFOL N/A 05/28/2019   Procedure: COLONOSCOPY WITH PROPOFOL;  Surgeon: West Bali, MD;  Location: [facility];  Service: Endoscopy;  Laterality: N/A;  9:15am   CYSTOSTOMY  11/22/2011   Procedure: CYSTOSTOMY SUPRAPUBIC;  Surgeon: Ky Barban, MD;  Location: [facility] OR[facility];  Service: Urology;  Laterality: N/A;   ESOPHAGOGASTRODUODENOSCOPY N/A 03/08/2014   Procedure: ESOPHAGOGASTRODUODENOSCOPY (EGD);  Surgeon: West Bali, MD;  Location: [facility];  Service: Endoscopy;  Laterality: N/A;   ESOPHAGOGASTRODUODENOSCOPY     ESOPHAGOGASTRODUODENOSCOPY (EGD) WITH PROPOFOL N/A 05/28/2019   Procedure: ESOPHAGOGASTRODUODENOSCOPY (EGD) WITH PROPOFOL;  Surgeon: West Bali, MD;  Location: [facility];  Service: Endoscopy;  Laterality: N/A;   ESOPHAGOGASTRODUODENOSCOPY (EGD) WITH PROPOFOL N/A 01/11/2023   Procedure: ESOPHAGOGASTRODUODENOSCOPY (EGD) WITH PROPOFOL;  Surgeon: Lanelle Bal, DO;  Location: [facility];  Service: Endoscopy;  Laterality: N/A;  9:45 am, asa 3   GIVENS CAPSULE STUDY N/A 06/15/2019   Procedure: GIVENS CAPSULE STUDY;  Surgeon: West Bali, MD;  Location: [facility];  Service: Endoscopy;  Laterality: N/A;  7:30am   HALLUX VALGUS CORRECTION     bilateral foot surgery for bone repairs-multiple   IR IMAGING GUIDED PORT INSERTION  08/19/2023   LOBECTOMY Right 10/15/2016   Procedure: RIGHT UPPER  LOBECTOMY;  Surgeon: Loreli Slot, MD;  Location[facility] OR;  Service: Thoracic;  Laterality: Right;   LYMPH NODE DISSECTION Right 10/15/2016   Procedure: LYMPH NODE DISSECTION;  Surgeon: Loreli Slot, MD;  Location: [facility] OR;  Service: Thoracic;  Laterality: Right;   POLYPECTOMY  05/28/2019   Procedure: POLYPECTOMY;  Surgeon: West Bali, MD;  Location: [facility];  Service: Endoscopy;;   SAVORY DILATION  N/A 05/28/2019   Procedure: SAVORY DILATION;  Surgeon: West Bali, MD;  Location: [facility];  Service: Endoscopy;  Laterality: N/A;  15/16/17   VAGINA RECONSTRUCTION SURGERY     tvt 2006- [place]   VIDEO ASSISTED THORACOSCOPY (VATS)/WEDGE RESECTION Right 10/15/2016   Procedure: RIGHT VIDEO ASSISTED THORACOSCOPY (VATS)/WEDGE RESECTION;  Surgeon: Loreli Slot, MD;  Location: [facility] OR;  Service: Thoracic;  Laterality: Right;    REVIEW OF SYSTEMS:  Constitutional: positive for fatigue Eyes: negative Ears, nose, mouth, throat, and face: negative Respiratory: positive for cough Cardiovascular: negative Gastrointestinal: negative Genitourinary:negative Integument/breast: negative Hematologic/lymphatic: negative Musculoskeletal:negative Neurological: negative Behavioral/Psych: negative Endocrine: negative Allergic/Immunologic: negative   PHYSICAL EXAMINATION: General appearance: alert, cooperative, fatigued, and no distress Head: Normocephalic, without obvious abnormality, atraumatic Neck: no adenopathy, no JVD, supple, symmetrical, trachea midline, and thyroid not enlarged, symmetric, no tenderness/mass/nodules Lymph nodes: Cervical, supraclavicular, and axillary nodes normal. Resp: clear to auscultation bilaterally Back: symmetric, no curvature. ROM normal. No CVA tenderness. Cardio: regular rate and rhythm, S1, S2 normal, no murmur, click, rub or gallop GI: soft, non-tender; bowel sounds normal; no masses,  no organomegaly Extremities: extremities  normal, atraumatic, no cyanosis or edema Neurologic: Alert and oriented X 3, normal strength and tone. Normal symmetric reflexes. Normal coordination and gait  ECOG PERFORMANCE STATUS: 0 - Asymptomatic  Blood pressure 130/85, pulse (!) 58, temperature 98 F (36.7 C), temperature source Temporal, resp. rate 17, height 5\' 7"  (1.702 m), weight 178 lb (80.7 kg), SpO2 100%.  LABORATORY DATA: Lab Results  Component Value Date   WBC 2.0 (L) 10/02/2023   HGB 9.8 (L) 10/02/2023   HCT 29.8 (L) 10/02/2023   MCV 77.0 (L) 10/02/2023   PLT 270 10/02/2023      Chemistry      Component Value Date/Time   NA 140 09/24/2023 1418   NA 142 10/29/2022 1158   NA 141 05/29/2017 0921   K 3.7 09/24/2023 1418   K 4.8 05/29/2017 0921   CL 106 09/24/2023 1418   CO2 28 09/24/2023 1418   CO2 28 05/29/2017 0921   BUN 6 (L) 09/24/2023 1418   BUN 21 10/29/2022 1158   BUN 13.5 05/29/2017 0921   CREATININE 0.68 09/24/2023 1418   CREATININE 0.8 05/29/2017 0921      Component Value Date/Time   CALCIUM 9.0 09/24/2023 1418   CALCIUM 9.5 05/29/2017 0921   ALKPHOS 63 09/24/2023 1418   ALKPHOS 104 05/29/2017 0921   AST 16 09/24/2023 1418   AST 17 05/29/2017 0921   ALT 13 09/24/2023 1418   ALT 20 05/29/2017 0921   BILITOT 0.3 09/24/2023 1418   BILITOT 0.40 05/29/2017 0921       RADIOGRAPHIC STUDIES: No results found.    ASSESSMENT AND PLAN: This is a very pleasant 68 years old white female with stage IV (T1a, N3, M1 C) non-small cell lung cancer initially diagnosed as stage IA non-small cell lung cancer status post right upper lobectomy with lymph node dissection.  This was diagnosed in March 2018 with evidence for disease recurrence and metastasis in December 2024. The patient has been on observation and she is feeling fine today with no concerning complaints. The patient had repeat CT scan of the chest performed recently.  I personally independently reviewed the scan images and discussed the results  with the patient today.  Unfortunately her scan showed interval development of several abnormal lymph nodes throughout the chest including right hilar, mediastinal, supraclavicular and bilateral axillary as well  as upper abdominal lymphadenopathy.  There was also increasing right adrenal nodule worrisome for adrenal metastasis. She underwent ultrasound-guided core biopsy of right axillary lymph node and the final pathology was consistent with metastatic adenocarcinoma of lung primary.  We will send the tissue block to [facility] for molecular studies and PD-L1 expression.  Molecular studies by [id number] showed positive KRAS G12C mutation. She underwent whole brain radiation for brain metastasis. The patient is here today to start the first dose of systemic chemotherapy with carboplatin for AUC of 5, Alimta 500 Mg/M2 and Keytruda 200 Mg IV every 3 weeks.  First dose August 21, 2023.  Status post 2 cycles.    Metastatic non-small cell lung cancer (NSCLC) adenocarcinoma with KRAS G12C mutation Diagnosed in December 2024. Currently undergoing chemotherapy with carboplatin for AUC of 5, Alimta 500 Mg/M2 and Keytruda 200 Mg IV every 3 weeks. following whole brain radiation. Presenting with persistent dry cough, likely due to disease in the chest causing irritation. No hemoptysis reported. Experiencing fatigue as a side effect of treatment. Current CBC shows leukopenia with a white blood cell count of 2.0 and an absolute neutrophil count of 0.9, indicating a need for treatment delay to allow recovery. Hemoglobin is low at 9.8, but improved from previous 9.2. Platelets are normal at 270. Decision made to delay chemotherapy to prevent further decline in blood counts and associated complications. - Delay chemotherapy for one week to allow recovery of white blood cell count. - Remove needle from port-a-cath and reschedule treatment for next week. - Prescribe Hycodan for cough management and send prescription to  [facility] in [place]. - Continue folic acid supplementation daily. - Plan to resume chemotherapy next week if blood counts recover. - Schedule a scan after cycle number three to assess treatment efficacy.   The patient was advised to call immediately if she has any other concerning symptoms in the interval. The patient voices understanding of current disease status and treatment options and is in agreement with the current care plan. All questions were answered. The patient knows to call the clinic with any problems, questions or concerns. We can certainly see the patient much sooner if necessary.  Disclaimer: This note was dictated with voice recognition software. Similar sounding words can inadvertently be transcribed and may not be corrected upon review.

## 2023-10-03 ENCOUNTER — Telehealth: Payer: Self-pay | Admitting: Internal Medicine

## 2023-10-03 ENCOUNTER — Telehealth: Payer: Self-pay | Admitting: *Deleted

## 2023-10-03 LAB — T4: T4, Total: 9.2 ug/dL (ref 4.5–12.0)

## 2023-10-03 NOTE — Telephone Encounter (Signed)
 Contacted by patient's friend Elliot Gault (on [place]). Mr. [name] is currently staying with patient - brought her to appt with Dr. Arbutus Ped on 3/5. He said he had just arrived from out of town before the appointment and had not seen the patient have these symptoms or episodes so did not report them to Dr. Arbutus Ped.   Mr. [name] reported patient had episodes of confusion past 2 evenings, then just sits and stares. He said she doesn't seem to know what is happening during this time.   He reports she was incontinent of urine x 2 yesterday and had bowel incontinence x 2 with loose stools today. He said she told him she did not feel any urge to go - it just happened.   Mr. [name] asked if there is anything Dr. Arbutus Ped could order or recommend to help.

## 2023-10-03 NOTE — Telephone Encounter (Signed)
 Rescheduled appointments per orders modified. Left the patient a voicemail with appointment details.

## 2023-10-04 ENCOUNTER — Other Ambulatory Visit: Payer: Self-pay

## 2023-10-04 NOTE — Telephone Encounter (Signed)
 Spoke with patients friend Greggory Stallion this morning to check in on the patient.  Greggory Stallion reports that patient has been acting okay since last night.  Greggory Stallion reports that the patient stated that these symptoms has been going on for about a month now.  Informed Greggory Stallion that it is Dr. Asa Lente recommendation that she go to the ER to be evaluated. Greggory Stallion reports that he will inform patient of Dr. Asa Lente recommendations. Greggory Stallion verbalized understanding.

## 2023-10-07 ENCOUNTER — Ambulatory Visit (HOSPITAL_COMMUNITY)
Admission: RE | Admit: 2023-10-07 | Discharge: 2023-10-07 | Disposition: A | Discharge status: Home / Self Care | Payer: Medicare Other | Source: Ambulatory Visit | Attending: Radiation Oncology | Admitting: Radiation Oncology

## 2023-10-07 ENCOUNTER — Other Ambulatory Visit: Payer: Self-pay

## 2023-10-07 ENCOUNTER — Inpatient Hospital Stay

## 2023-10-07 ENCOUNTER — Encounter: Payer: Self-pay | Admitting: Internal Medicine

## 2023-10-07 DIAGNOSIS — C3491 Malignant neoplasm of unspecified part of right bronchus or lung: Secondary | ICD-10-CM

## 2023-10-07 DIAGNOSIS — C7931 Secondary malignant neoplasm of brain: Secondary | ICD-10-CM

## 2023-10-07 MED ORDER — GADOBUTROL 1 MMOL/ML IV SOLN
8.0000 mL | Freq: Once | INTRAVENOUS | Status: AC | PRN
  Administered 2023-10-07: 8 mL via INTRAVENOUS

## 2023-10-07 MED FILL — Fosaprepitant Dimeglumine For IV Infusion 150 MG (Base Eq): INTRAVENOUS | Qty: 5 | Status: AC

## 2023-10-08 ENCOUNTER — Inpatient Hospital Stay

## 2023-10-08 ENCOUNTER — Inpatient Hospital Stay: Payer: Medicare Other

## 2023-10-08 ENCOUNTER — Inpatient Hospital Stay (HOSPITAL_BASED_OUTPATIENT_CLINIC_OR_DEPARTMENT_OTHER): Admitting: Internal Medicine

## 2023-10-08 VITALS — BP 131/68 | HR 71 | Temp 97.9°F | Resp 20

## 2023-10-08 VITALS — BP 90/51 | HR 87 | Temp 96.0°F | Resp 16 | Ht 67.0 in | Wt 167.3 lb

## 2023-10-08 DIAGNOSIS — C3490 Malignant neoplasm of unspecified part of unspecified bronchus or lung: Secondary | ICD-10-CM | POA: Diagnosis not present

## 2023-10-08 DIAGNOSIS — C3491 Malignant neoplasm of unspecified part of right bronchus or lung: Secondary | ICD-10-CM

## 2023-10-08 DIAGNOSIS — Z95828 Presence of other vascular implants and grafts: Secondary | ICD-10-CM

## 2023-10-08 DIAGNOSIS — E861 Hypovolemia: Secondary | ICD-10-CM | POA: Diagnosis not present

## 2023-10-08 DIAGNOSIS — Z5111 Encounter for antineoplastic chemotherapy: Secondary | ICD-10-CM | POA: Diagnosis not present

## 2023-10-08 LAB — CBC WITH DIFFERENTIAL (CANCER CENTER ONLY)
Abs Immature Granulocytes: 0.11 10*3/uL — ABNORMAL HIGH (ref 0.00–0.07)
Basophils Absolute: 0.1 10*3/uL (ref 0.0–0.1)
Basophils Relative: 1 %
Eosinophils Absolute: 0 10*3/uL (ref 0.0–0.5)
Eosinophils Relative: 0 %
HCT: 33.7 % — ABNORMAL LOW (ref 36.0–46.0)
Hemoglobin: 11.2 g/dL — ABNORMAL LOW (ref 12.0–15.0)
Immature Granulocytes: 2 %
Lymphocytes Relative: 23 %
Lymphs Abs: 1.3 10*3/uL (ref 0.7–4.0)
MCH: 25.3 pg — ABNORMAL LOW (ref 26.0–34.0)
MCHC: 33.2 g/dL (ref 30.0–36.0)
MCV: 76.2 fL — ABNORMAL LOW (ref 80.0–100.0)
Monocytes Absolute: 0.6 10*3/uL (ref 0.1–1.0)
Monocytes Relative: 11 %
Neutro Abs: 3.6 10*3/uL (ref 1.7–7.7)
Neutrophils Relative %: 63 %
Platelet Count: 257 10*3/uL (ref 150–400)
RBC: 4.42 MIL/uL (ref 3.87–5.11)
RDW: 17.8 % — ABNORMAL HIGH (ref 11.5–15.5)
WBC Count: 5.7 10*3/uL (ref 4.0–10.5)
nRBC: 0 % (ref 0.0–0.2)

## 2023-10-08 LAB — CMP (CANCER CENTER ONLY)
ALT: 20 U/L (ref 0–44)
AST: 28 U/L (ref 15–41)
Albumin: 3.7 g/dL (ref 3.5–5.0)
Alkaline Phosphatase: 65 U/L (ref 38–126)
Anion gap: 11 (ref 5–15)
BUN: 20 mg/dL (ref 8–23)
CO2: 29 mmol/L (ref 22–32)
Calcium: 9 mg/dL (ref 8.9–10.3)
Chloride: 100 mmol/L (ref 98–111)
Creatinine: 1.14 mg/dL — ABNORMAL HIGH (ref 0.44–1.00)
GFR, Estimated: 52 mL/min — ABNORMAL LOW (ref >60)
Glucose, Bld: 185 mg/dL — ABNORMAL HIGH (ref 70–99)
Potassium: 3.5 mmol/L (ref 3.5–5.1)
Sodium: 140 mmol/L (ref 135–145)
Total Bilirubin: 0.6 mg/dL (ref 0.0–1.2)
Total Protein: 6.8 g/dL (ref 6.5–8.1)

## 2023-10-08 LAB — TSH: TSH: 1.309 u[IU]/mL (ref 0.350–4.500)

## 2023-10-08 MED ORDER — SODIUM CHLORIDE 0.9 % IV SOLN
500.0000 mg/m2 | Freq: Once | INTRAVENOUS | Status: AC
  Administered 2023-10-08: 1000 mg via INTRAVENOUS
  Filled 2023-10-08: qty 40

## 2023-10-08 MED ORDER — CYANOCOBALAMIN 1000 MCG/ML IJ SOLN
1000.0000 ug | Freq: Once | INTRAMUSCULAR | Status: AC
Start: 2023-10-08 — End: 2023-10-08
  Administered 2023-10-08: 1000 ug via INTRAMUSCULAR
  Filled 2023-10-08: qty 1

## 2023-10-08 MED ORDER — SODIUM CHLORIDE 0.9% FLUSH
10.0000 mL | INTRAVENOUS | Status: DC | PRN
Start: 2023-10-08 — End: 2023-10-08
  Administered 2023-10-08: 10 mL

## 2023-10-08 MED ORDER — HEPARIN SOD (PORK) LOCK FLUSH 100 UNIT/ML IV SOLN
500.0000 [IU] | Freq: Once | INTRAVENOUS | Status: AC | PRN
  Administered 2023-10-08: 500 [IU]

## 2023-10-08 MED ORDER — SODIUM CHLORIDE 0.9% FLUSH
10.0000 mL | Freq: Once | INTRAVENOUS | Status: AC
  Administered 2023-10-08: 10 mL

## 2023-10-08 MED ORDER — SODIUM CHLORIDE 0.9 % IV SOLN: INTRAVENOUS | Status: AC

## 2023-10-08 MED ORDER — SODIUM CHLORIDE 0.9 % IV SOLN
400.0000 mg | Freq: Once | INTRAVENOUS | Status: AC
  Administered 2023-10-08: 400 mg via INTRAVENOUS
  Filled 2023-10-08: qty 40

## 2023-10-08 MED ORDER — SODIUM CHLORIDE 0.9 % IV SOLN
200.0000 mg | Freq: Once | INTRAVENOUS | Status: AC
  Administered 2023-10-08: 200 mg via INTRAVENOUS
  Filled 2023-10-08: qty 200

## 2023-10-08 MED ORDER — DEXAMETHASONE SODIUM PHOSPHATE 10 MG/ML IJ SOLN
10.0000 mg | Freq: Once | INTRAMUSCULAR | Status: AC
  Administered 2023-10-08: 10 mg via INTRAVENOUS
  Filled 2023-10-08: qty 1

## 2023-10-08 MED ORDER — SODIUM CHLORIDE 0.9 % IV SOLN: INTRAVENOUS | Status: DC

## 2023-10-08 MED ORDER — SODIUM CHLORIDE 0.9 % IV SOLN
150.0000 mg | Freq: Once | INTRAVENOUS | Status: AC
  Administered 2023-10-08: 150 mg via INTRAVENOUS
  Filled 2023-10-08: qty 150
  Filled 2023-10-08: qty 5

## 2023-10-08 MED ORDER — PALONOSETRON HCL INJECTION 0.25 MG/5ML
0.2500 mg | Freq: Once | INTRAVENOUS | Status: AC
  Administered 2023-10-08: 0.25 mg via INTRAVENOUS
  Filled 2023-10-08: qty 5

## 2023-10-08 NOTE — Progress Notes (Signed)
 Ok to reduce carbo dose today to 400mg  due to increase in pt's SCr per Dr. Arbutus Ped.  Drusilla Kanner, PharmD, MBA

## 2023-10-08 NOTE — Progress Notes (Signed)
 [facility] [phone]   [phone]  OFFICE PROGRESS NOTE  Koirala, Dibas, MD [address] [place] [address]  DIAGNOSIS:  1) recurrent/metastatic non-small cell lung cancer, adenocarcinoma presented with metastatic adenopathy in the thorax as well as mediastinum, axillary lymph nodes and potential local recurrence at the right hilum along the inferior margin of the resection staple line with bilateral hypermetabolic adrenal metastasis and hypermetabolic bone metastasis to the T11 vertebral body as well as brain metastasis diagnosed in December 2024.  2) Stage IA (T1a, N0, M0) non-small cell lung cancer, adenocarcinoma presented with right upper lobe pulmonary nodule   Detected Alteration(s) / Biomarker(s) Associated FDA-approved therapies Clinical Trial Availability % cfDNA or Amplification KRAS G12C approved by FDA Adagrasib, Sotorasib approved in other indication Adagrasib+cetuximab Yes 2.8%  TP53 E204* None Yes 2.0%   PRIOR THERAPY:  1) S/P right VATS with right upper lobectomy and mediastinal lymph node dissection under the care of Dr. Dorris Fetch on October 15, 2016. 2) whole brain irradiation under the care of of Dr. Basilio Cairo.  CURRENT THERAPY: Systemic chemotherapy with carboplatin for AUC of 5, Alimta 500 Mg/M2 and Keytruda 200 Mg IV every 3 weeks.  First dose August 20, 2022.  INTERVAL HISTORY: [name] 68 y.o. female returns to the clinic today for annual follow-up visit.Discussed the use of AI scribe software for clinical note transcription with the patient, who gave verbal consent to proceed.  History of Present Illness   The patient presents for continuation of chemotherapy treatment.  She has recurrent/metastatic non-small cell lung cancer, adenocarcinoma presented with metastatic adenopathy in the thorax as well as mediastinum, axillary lymph nodes and potential local recurrence at the right hilum  along the inferior margin of the resection staple line with bilateral hypermetabolic adrenal metastasis and hypermetabolic bone metastasis to the T11 vertebral body as well as brain metastasis diagnosed in December 2024. She has completed two cycles of chemotherapy with carboplatin for AUC of 5, Alimta 500 Mg/M2 and Keytruda 200 Mg IV every 3 weeks.  First dose August 20, 2022, with a delay in the previous cycle due to low white blood cell count. Current lab results indicate a normal white blood cell count, allowing her to proceed with the third cycle.  She feels 'okay' today but experienced dizziness in the morning. No nausea, vomiting, or diarrhea. There is no recent weight loss, although she has lost some weight overall.        MEDICAL HISTORY: Past Medical History:  Diagnosis Date   Adenocarcinoma of right lung, stage 1 (HCC) 11/29/2016   Cancer (HCC)    Chronic pain    Depression    takes Lexapro daily   Diabetes mellitus    takes Metformin daily and has an insulin pump.Fasting blood sugar runs250   GERD (gastroesophageal reflux disease)    takes Pantoprazole daily   History of colon polyps    benign   Hyperlipidemia    takes Atorvastatin daily   Hypertension    takes Amlodipine and Lisinopril daily   Insomnia    takes Trazodone nightly   Nocturia    Thyroid disease    Urinary frequency    Urinary urgency     ALLERGIES:  is allergic to synthroid [levothyroxine sodium], hydrochlorothiazide, and iodine.  MEDICATIONS:  Current Outpatient Medications  Medication Sig Dispense Refill   ACCU-CHEK AVIVA PLUS test strip USE TO MONITOR GLUCOSE LEVELS TWICE A DAY E11.9 100 strip 12  ACCU-CHEK SOFTCLIX LANCETS lancets Use to monitor glucose levels BID; E11.9 (Patient taking differently: 1 each by Other route 2 (two) times daily. E11.9) 100 each 12   acetaminophen (TYLENOL) 500 MG tablet Take 1 tablet (500 mg total) by mouth every 6 (six) hours as needed. (Patient taking differently:  Take 500 mg by mouth every 6 (six) hours as needed (for pain).) 30 tablet 0   atorvastatin (LIPITOR) 20 MG tablet Take 20 mg by mouth daily.  11   azithromycin (ZITHROMAX) 250 MG tablet Take 250 mg by mouth daily.     benzonatate (TESSALON) 100 MG capsule Take 1 capsule (100 mg total) by mouth 3 (three) times daily as needed. 30 capsule 2   Blood Glucose Calibration (GLUCOSE CONTROL) SOLN 1 Bottle by In Vitro route as needed. Use to calibrate Accu-Chek Aviva Plus device prn; please provide control solution compatible with Accu-Chek Aviva Plus device. (Patient taking differently: 1 each by Other route as needed (Use to calibrate glucometer). E11.9) 1 each 1   Blood Glucose Monitoring Suppl (ACCU-CHEK AVIVA PLUS) w/Device KIT 1 each by Does not apply route 2 (two) times daily. Use to monitor glucose levels BID; E11.9 (Patient taking differently: 1 each by Does not apply route 2 (two) times daily. E11.9) 1 kit 0   chlorthalidone (HYGROTON) 25 MG tablet Take 12.5 mg by mouth daily.      clobetasol cream (TEMOVATE) 0.05 % Apply 1 Application topically 2 (two) times daily as needed (itching).     cyclobenzaprine (FLEXERIL) 10 MG tablet TAKE 1 TABLET BY MOUTH TWICE A DAY AS NEEDED FOR MUSCLE SPASMS     dexamethasone (DECADRON) 4 MG tablet TAKE 1 TABLET BY MOUTH 3 TIMES DAILY. 30 tablet 0   diclofenac Sodium (VOLTAREN ARTHRITIS PAIN) 1 % GEL Apply 4 g topically 4 (four) times daily. (Patient taking differently: Apply 4 g topically 3 (three) times daily as needed (joint pain).) 150 g 0   escitalopram (LEXAPRO) 10 MG tablet Take 1 tablet (10 mg total) by mouth every morning. 90 tablet 3   folic acid (FOLVITE) 1 MG tablet Take 1 tablet (1 mg total) by mouth daily. Start 7 days before pemetrexed chemotherapy. Continue until 21 days after pemetrexed completed. 100 tablet 3   HYDROcodone bit-homatropine (HYCODAN) 5-1.5 MG/5ML syrup Take 5 mLs by mouth every 6 (six) hours as needed for cough. 120 mL 0   ibuprofen  (ADVIL) 800 MG tablet Take 800 mg by mouth every 8 (eight) hours as needed for moderate pain.     insulin glargine (LANTUS SOLOSTAR) 100 UNIT/ML Solostar Pen Inject 15 Units into the skin at bedtime. 15 mL 3   Insulin Pen Needle (B-D ULTRAFINE III SHORT PEN) 31G X 8 MM MISC USE AS DIRECTED 100 each 5   lidocaine-prilocaine (EMLA) cream Apply to affected area once 30 g 3   lisinopril (ZESTRIL) 10 MG tablet Take 10 mg by mouth daily.     meloxicam (MOBIC) 7.5 MG tablet Take 7.5 mg by mouth daily.     memantine (NAMENDA TITRATION PACK) tablet pack 5 mg/day for =1 week; 5 mg twice daily for =1 week; 15 mg/day given in 5 mg and 10 mg separated doses for =1 week; then 10 mg twice daily. Take through 01/20/2024, then stop. 49 tablet 12   metFORMIN (GLUCOPHAGE-XR) 500 MG 24 hr tablet Take 1 tablet (500 mg total) by mouth daily with breakfast. 90 tablet 3   ondansetron (ZOFRAN) 8 MG tablet Take 1 tablet (8  mg total) by mouth every 8 (eight) hours as needed for nausea or vomiting. Start on the third day after carboplatin. 30 tablet 1   pantoprazole (PROTONIX) 40 MG tablet Take 1 tablet (40 mg total) by mouth 2 (two) times daily. 180 tablet 3   predniSONE (DELTASONE) 10 MG tablet Take 10 mg by mouth daily with breakfast.     prochlorperazine (COMPAZINE) 10 MG tablet Take 1 tablet (10 mg total) by mouth every 6 (six) hours as needed for nausea or vomiting. 30 tablet 1   tirzepatide (MOUNJARO) 15 MG/0.5ML Pen Inject 15 mg into the skin once a week. 6 mL 3   traZODone (DESYREL) 100 MG tablet Take 2 tablets (200 mg total) by mouth at bedtime. 180 tablet 3   triamcinolone cream (KENALOG) 0.1 % Apply 1 application topically 2 (two) times daily. (Patient taking differently: Apply 1 application  topically 2 (two) times daily as needed (irritation).) 30 g 0   No current facility-administered medications for this visit.    SURGICAL HISTORY:  Past Surgical History:  Procedure Laterality Date   ABDOMINAL HYSTERECTOMY      ABDOMINAL SURGERY     BIOPSY  05/28/2019   Procedure: BIOPSY;  Surgeon: West Bali, MD;  [facility]: [facility];  Service: Endoscopy;;  random colon/gastric/duodenum   BIOPSY  01/11/2023   Procedure: BIOPSY;  Surgeon: Lanelle Bal, DO;  [facility];  Service: Endoscopy;;   COLONOSCOPY  2005 MAC   TICs, IH   COLONOSCOPY N/A 03/08/2014   Procedure: COLONOSCOPY;  Surgeon: West Bali, MD;  [facility]: [facility];  Service: Endoscopy;  Laterality: N/A;  1015   COLONOSCOPY WITH PROPOFOL N/A 05/28/2019   Procedure: COLONOSCOPY WITH PROPOFOL;  Surgeon: West Bali, MD;  [facility]: [facility];  Service: Endoscopy;  Laterality: N/A;  9:15am   CYSTOSTOMY  11/22/2011   Procedure: CYSTOSTOMY SUPRAPUBIC;  Surgeon: Ky Barban, MD;  Location: [facility];  Service: Urology;  Laterality: N/A;   ESOPHAGOGASTRODUODENOSCOPY N/A 03/08/2014   Procedure: ESOPHAGOGASTRODUODENOSCOPY (EGD);  Surgeon: West Bali, MD;  [facility];  Service: Endoscopy;  Laterality: N/A;   ESOPHAGOGASTRODUODENOSCOPY     ESOPHAGOGASTRODUODENOSCOPY (EGD) WITH PROPOFOL N/A 05/28/2019   Procedure: ESOPHAGOGASTRODUODENOSCOPY (EGD) WITH PROPOFOL;  Surgeon: West Bali, MD;  [facility];  Service: Endoscopy;  Laterality: N/A;   ESOPHAGOGASTRODUODENOSCOPY (EGD) WITH PROPOFOL N/A 01/11/2023   Procedure: ESOPHAGOGASTRODUODENOSCOPY (EGD) WITH PROPOFOL;  Surgeon: Lanelle Bal, DO;  [facility];  Service: Endoscopy;  Laterality: N/A;  9:45 am, asa 3   GIVENS CAPSULE STUDY N/A 06/15/2019   Procedure: GIVENS CAPSULE STUDY;  Surgeon: West Bali, MD;  [facility];  Service: Endoscopy;  Laterality: N/A;  7:30am   HALLUX VALGUS CORRECTION     bilateral foot surgery for bone repairs-multiple   IR IMAGING GUIDED PORT INSERTION  08/19/2023   LOBECTOMY Right 10/15/2016   Procedure: RIGHT UPPER LOBECTOMY;  Surgeon: Loreli Slot, MD;  Location[facility] OR;   Service: Thoracic;  Laterality: Right;   LYMPH NODE DISSECTION Right 10/15/2016   Procedure: LYMPH NODE DISSECTION;  Surgeon: Loreli Slot, MD;  Location: [facility] OR;  Service: Thoracic;  Laterality: Right;   POLYPECTOMY  05/28/2019   Procedure: POLYPECTOMY;  Surgeon: West Bali, MD;  [facility];  Service: Endoscopy;;   SAVORY DILATION N/A 05/28/2019   Procedure: SAVORY DILATION;  Surgeon: West Bali, MD;  [facility];  Service: Endoscopy;  Laterality: N/A;  15/16/17   VAGINA RECONSTRUCTION SURGERY     tvt 2006- [place]   VIDEO ASSISTED THORACOSCOPY (VATS)/WEDGE RESECTION Right 10/15/2016   Procedure: RIGHT VIDEO ASSISTED THORACOSCOPY (VATS)/WEDGE RESECTION;  Surgeon: Loreli Slot, MD;  Location: [facility];  Service: Thoracic;  Laterality: Right;    REVIEW OF SYSTEMS:  Constitutional: positive for fatigue Eyes: negative Ears, nose, mouth, throat, and face: negative Respiratory: negative Cardiovascular: negative Gastrointestinal: negative Genitourinary:negative Integument/breast: negative Hematologic/lymphatic: negative Musculoskeletal:negative Neurological: negative Behavioral/Psych: negative Endocrine: negative Allergic/Immunologic: negative   PHYSICAL EXAMINATION: General appearance: alert, cooperative, fatigued, and no distress Head: Normocephalic, without obvious abnormality, atraumatic Neck: no adenopathy, no JVD, supple, symmetrical, trachea midline, and thyroid not enlarged, symmetric, no tenderness/mass/nodules Lymph nodes: Cervical, supraclavicular, and axillary nodes normal. Resp: clear to auscultation bilaterally Back: symmetric, no curvature. ROM normal. No CVA tenderness. Cardio: regular rate and rhythm, S1, S2 normal, no murmur, click, rub or gallop GI: soft, non-tender; bowel sounds normal; no masses,  no organomegaly Extremities: extremities normal, atraumatic, no cyanosis or edema Neurologic: Alert and oriented X 3,  normal strength and tone. Normal symmetric reflexes. Normal coordination and gait  ECOG PERFORMANCE STATUS: 1 - Symptomatic but completely ambulatory  Blood pressure (!) 90/51, pulse 87, temperature (!) 96 F (35.6 C), resp. rate 16, height 5\' 7"  (1.702 m), weight 167 lb 4.8 oz (75.9 kg), SpO2 97%.  LABORATORY DATA: Lab Results  Component Value Date   WBC 2.0 (L) 10/02/2023   HGB 9.8 (L) 10/02/2023   HCT 29.8 (L) 10/02/2023   MCV 77.0 (L) 10/02/2023   PLT 270 10/02/2023      Chemistry      Component Value Date/Time   NA 135 10/02/2023 1049   NA 142 10/29/2022 1158   NA 141 05/29/2017 0921   K 3.6 10/02/2023 1049   K 4.8 05/29/2017 0921   CL 101 10/02/2023 1049   CO2 28 10/02/2023 1049   CO2 28 05/29/2017 0921   BUN 10 10/02/2023 1049   BUN 21 10/29/2022 1158   BUN 13.5 05/29/2017 0921   CREATININE 0.68 10/02/2023 1049   CREATININE 0.8 05/29/2017 0921      Component Value Date/Time   CALCIUM 8.6 (L) 10/02/2023 1049   CALCIUM 9.5 05/29/2017 0921   ALKPHOS 59 10/02/2023 1049   ALKPHOS 104 05/29/2017 0921   AST 33 10/02/2023 1049   AST 17 05/29/2017 0921   ALT 14 10/02/2023 1049   ALT 20 05/29/2017 0921   BILITOT 0.4 10/02/2023 1049   BILITOT 0.40 05/29/2017 0921       RADIOGRAPHIC STUDIES: No results found.    ASSESSMENT AND PLAN: This is a very pleasant 68 years old white female with stage IV (T1a, N3, M1 C) non-small cell lung cancer initially diagnosed as stage IA non-small cell lung cancer status post right upper lobectomy with lymph node dissection.  This was diagnosed in March 2018 with evidence for disease recurrence and metastasis in December 2024. The patient has been on observation and she is feeling fine today with no concerning complaints. The patient had repeat CT scan of the chest performed recently.  I personally independently reviewed the scan images and discussed the results with the patient today.  Unfortunately her scan showed interval  development of several abnormal lymph nodes throughout the chest including right hilar, mediastinal, supraclavicular and bilateral axillary as well as upper abdominal lymphadenopathy.  There was also increasing right adrenal nodule worrisome for adrenal  metastasis. She underwent ultrasound-guided core biopsy of right axillary lymph node and the final pathology was consistent with metastatic adenocarcinoma of lung primary.  We will send the tissue block to [facility] for molecular studies and PD-L1 expression.  Molecular studies by [id number] showed positive KRAS G12C mutation. She underwent whole brain radiation for brain metastasis. The patient is here today to start the first dose of systemic chemotherapy with carboplatin for AUC of 5, Alimta 500 Mg/M2 and Keytruda 200 Mg IV every 3 weeks.  First dose August 21, 2023.  Status post 2 cycles.     Recurrent/metastatic non-small cell lung cancer, adenocarcinoma presented with metastatic adenopathy in the thorax as well as mediastinum, axillary lymph nodes and potential local recurrence at the right hilum along the inferior margin of the resection staple line with bilateral hypermetabolic adrenal metastasis and hypermetabolic bone metastasis to the T11 vertebral body as well as brain metastasis diagnosed in December 2024. Currently on chemotherapy with carboplatin for AUC of 5, Alimta 500 Mg/M2 and Keytruda 200 Mg IV every 3 weeks.  First dose August 20, 2022. She has completed two cycles. Treatment was delayed due to leukopenia, but her white blood cell count has normalized, allowing continuation. She reports morning dizziness but no nausea, vomiting, or diarrhea. There is some weight loss, and she is advised to improve nutritional intake. - Proceed with cycle number three of chemotherapy. - Encourage nutritional intake to prevent further weight loss.  Follow-up A follow-up plan is in place to monitor her response to chemotherapy and overall health  status. - Order a scan of the chest, abdomen, and pelvis in two weeks to assess response to treatment. - Schedule a follow-up appointment in three weeks to review scan results and assess treatment progress.   For the hypotension, will arrange for the patient to receive 1 L of normal saline during her treatment today. The patient was advised to call immediately if she has any concerning symptoms in the interval.  The patient voices understanding of current disease status and treatment options and is in agreement with the current care plan. All questions were answered. The patient knows to call the clinic with any problems, questions or concerns. We can certainly see the patient much sooner if necessary.  Disclaimer: This note was dictated with voice recognition software. Similar sounding words can inadvertently be transcribed and may not be corrected upon review.

## 2023-10-08 NOTE — Patient Instructions (Signed)
 [facility] - A DEPT OF [facility]  Discharge Instructions: Thank you for choosing [facility] to provide your oncology and hematology care.   If you have a lab appointment with the Cancer Center, please go directly to the Cancer Center and check in at the registration area.   Wear comfortable clothing and clothing appropriate for easy access to any Portacath or PICC line.   We strive to give you quality time with your provider. You may need to reschedule your appointment if you arrive late (15 or more minutes).  Arriving late affects you and other patients whose appointments are after yours.  Also, if you miss three or more appointments without notifying the office, you may be dismissed from the clinic at the provider's discretion.      For prescription refill requests, have your pharmacy contact our office and allow 72 hours for refills to be completed.    Today you received the following chemotherapy and/or immunotherapy agents: CARBOplatin (PARAPLATIN), PEMEtrexed Disodium (ALIMTA), and pembrolizumab (KEYTRUDA)      To help prevent nausea and vomiting after your treatment, we encourage you to take your nausea medication as directed.  BELOW ARE SYMPTOMS THAT SHOULD BE REPORTED IMMEDIATELY: *FEVER GREATER THAN 100.4 F (38 C) OR HIGHER *CHILLS OR SWEATING *NAUSEA AND VOMITING THAT IS NOT CONTROLLED WITH YOUR NAUSEA MEDICATION *UNUSUAL SHORTNESS OF BREATH *UNUSUAL BRUISING OR BLEEDING *URINARY PROBLEMS (pain or burning when urinating, or frequent urination) *BOWEL PROBLEMS (unusual diarrhea, constipation, pain near the anus) TENDERNESS IN MOUTH AND THROAT WITH OR WITHOUT PRESENCE OF ULCERS (sore throat, sores in mouth, or a toothache) UNUSUAL RASH, SWELLING OR PAIN  UNUSUAL VAGINAL DISCHARGE OR ITCHING   Items with * indicate a potential emergency and should be followed up as soon as possible or go to the Emergency Department if any  problems should occur.  Please show the CHEMOTHERAPY ALERT CARD or IMMUNOTHERAPY ALERT CARD at check-in to the Emergency Department and triage nurse.  Should you have questions after your visit or need to cancel or reschedule your appointment, please contact [facility] - A DEPT OF [facility]  Dept: [phone]  and follow the prompts.  Office hours are 8:00 a.m. to 4:30 p.m. Monday - Friday. Please note that voicemails left after 4:00 p.m. may not be returned until the following business day.  We are closed weekends and major holidays. You have access to a nurse at all times for urgent questions. Please call the main number to the clinic Dept: [phone] and follow the prompts.   For any non-urgent questions, you may also contact your provider using MyChart. We now offer e-Visits for anyone 39 and older to request care online for non-urgent symptoms. For details visit [email or web address].   Also download the MyChart app! Go to the app store, search "MyChart", open the app, select [facility], and log in with your MyChart username and password.

## 2023-10-09 ENCOUNTER — Encounter: Payer: Self-pay | Admitting: Internal Medicine

## 2023-10-09 LAB — T4: T4, Total: 11.4 ug/dL (ref 4.5–12.0)

## 2023-10-15 ENCOUNTER — Telehealth: Payer: Self-pay

## 2023-10-15 ENCOUNTER — Inpatient Hospital Stay: Payer: Medicare Other

## 2023-10-15 NOTE — Progress Notes (Incomplete)
 PAIN: {Pain rating:20411} {PAIN DESCRIPTION:21022940} over {Location on body:14304}.  RESPIRATORY: {[facility] LUNG [id number]. Pt is on {Exam; oxygen delivery:30093}. Noted {Exam; skin abnormals:103} {[facility] RAD ONC SKIN ROS:11522895}.  SWALLOWING/DIET: Pt {gerd dysphagia:12484} {Desc; oto dysphagia:17829}. {Daily diet habits:20576}.  OTHER: Pt complains of {Blank multiple:19196::"fatigue","weakness","loss of sleep","poor appetite"}.  There were no vitals taken for this visit.   Wt Readings from Last 3 Encounters:  10/08/23 167 lb 4.8 oz (75.9 kg)  10/02/23 178 lb (80.7 kg)  09/18/23 175 lb 12.8 oz (79.7 kg)

## 2023-10-15 NOTE — Telephone Encounter (Signed)
 A PHASE III TRIAL OF STEREOTACTIC RADIOSURGERY COMPARED WITH HIPPOCAMPAL-AVOIDANT WHOLE BRAIN RADIOTHERAPY (HA-WBRT) PLUS MEMANTINE FOR 5 OR MORE BRAIN METASTASES  Rec'd a call from patient's emergency contact with concerns about patient status. They have left a message for palliative care provider at [facility]. Report patient has lost over 20 lb, 10 in the last week and vomited in the waiting room at last visit. I spoke with Dr Basilio Cairo, who is due to see patient tomorrow. She requested I reach out to Dr Arbutus Ped and symptom mgmt to see who could see patient while she is here tomorrow; sent message to them and to [facility] with palliative care, as friends are trying to get in touch with that team too.    Plan for research team to see patient for 8 week post-treatment visit while she is here tomorrow for labs, neurocogs, PROs.   Margret Chance Keeleigh Terris, RN, BSN, [facility] She  Her  [facility] Clinical Research Nurse [facility] Direct Dial [phone] 10/15/2023 4:44 PM

## 2023-10-16 ENCOUNTER — Other Ambulatory Visit: Payer: Self-pay

## 2023-10-16 ENCOUNTER — Inpatient Hospital Stay

## 2023-10-16 ENCOUNTER — Inpatient Hospital Stay (HOSPITAL_BASED_OUTPATIENT_CLINIC_OR_DEPARTMENT_OTHER): Admitting: Physician Assistant

## 2023-10-16 ENCOUNTER — Ambulatory Visit: Admitting: Dietician

## 2023-10-16 ENCOUNTER — Ambulatory Visit
Admission: RE | Admit: 2023-10-16 | Discharge: 2023-10-16 | Disposition: A | Discharge status: Home / Self Care | Payer: Medicare Other | Source: Ambulatory Visit | Attending: Radiation Oncology | Admitting: Radiation Oncology

## 2023-10-16 VITALS — BP 133/71 | HR 72 | Temp 97.8°F | Resp 16 | Wt 162.2 lb

## 2023-10-16 VITALS — BP 103/62 | HR 88 | Temp 97.8°F | Resp 16 | Wt 162.2 lb

## 2023-10-16 DIAGNOSIS — E861 Hypovolemia: Secondary | ICD-10-CM

## 2023-10-16 DIAGNOSIS — C3491 Malignant neoplasm of unspecified part of right bronchus or lung: Secondary | ICD-10-CM | POA: Diagnosis not present

## 2023-10-16 DIAGNOSIS — Z95828 Presence of other vascular implants and grafts: Secondary | ICD-10-CM

## 2023-10-16 DIAGNOSIS — Z5111 Encounter for antineoplastic chemotherapy: Secondary | ICD-10-CM | POA: Diagnosis not present

## 2023-10-16 DIAGNOSIS — C7931 Secondary malignant neoplasm of brain: Secondary | ICD-10-CM

## 2023-10-16 DIAGNOSIS — D61818 Other pancytopenia: Secondary | ICD-10-CM

## 2023-10-16 LAB — CMP (CANCER CENTER ONLY)
ALT: 30 U/L (ref 0–44)
AST: 23 U/L (ref 15–41)
Albumin: 3.8 g/dL (ref 3.5–5.0)
Alkaline Phosphatase: 69 U/L (ref 38–126)
Anion gap: 11 (ref 5–15)
BUN: 35 mg/dL — ABNORMAL HIGH (ref 8–23)
CO2: 27 mmol/L (ref 22–32)
Calcium: 9.2 mg/dL (ref 8.9–10.3)
Chloride: 99 mmol/L (ref 98–111)
Creatinine: 1.08 mg/dL — ABNORMAL HIGH (ref 0.44–1.00)
GFR, Estimated: 56 mL/min — ABNORMAL LOW (ref >60)
Glucose, Bld: 193 mg/dL — ABNORMAL HIGH (ref 70–99)
Potassium: 3.7 mmol/L (ref 3.5–5.1)
Sodium: 137 mmol/L (ref 135–145)
Total Bilirubin: 1.3 mg/dL — ABNORMAL HIGH (ref 0.0–1.2)
Total Protein: 6.6 g/dL (ref 6.5–8.1)

## 2023-10-16 LAB — CBC WITH DIFFERENTIAL (CANCER CENTER ONLY)
Abs Immature Granulocytes: 0.02 10*3/uL (ref 0.00–0.07)
Basophils Absolute: 0 10*3/uL (ref 0.0–0.1)
Basophils Relative: 1 %
Eosinophils Absolute: 0 10*3/uL (ref 0.0–0.5)
Eosinophils Relative: 2 %
HCT: 30.7 % — ABNORMAL LOW (ref 36.0–46.0)
Hemoglobin: 10.4 g/dL — ABNORMAL LOW (ref 12.0–15.0)
Immature Granulocytes: 1 %
Lymphocytes Relative: 42 %
Lymphs Abs: 0.9 10*3/uL (ref 0.7–4.0)
MCH: 25.8 pg — ABNORMAL LOW (ref 26.0–34.0)
MCHC: 33.9 g/dL (ref 30.0–36.0)
MCV: 76.2 fL — ABNORMAL LOW (ref 80.0–100.0)
Monocytes Absolute: 0.1 10*3/uL (ref 0.1–1.0)
Monocytes Relative: 3 %
Neutro Abs: 1.1 10*3/uL — ABNORMAL LOW (ref 1.7–7.7)
Neutrophils Relative %: 51 %
Platelet Count: 142 10*3/uL — ABNORMAL LOW (ref 150–400)
RBC: 4.03 MIL/uL (ref 3.87–5.11)
RDW: 16.9 % — ABNORMAL HIGH (ref 11.5–15.5)
WBC Count: 2.1 10*3/uL — ABNORMAL LOW (ref 4.0–10.5)
nRBC: 0 % (ref 0.0–0.2)

## 2023-10-16 LAB — MAGNESIUM: Magnesium: 1.9 mg/dL (ref 1.7–2.4)

## 2023-10-16 LAB — RESEARCH LABS

## 2023-10-16 LAB — SAMPLE TO BLOOD BANK

## 2023-10-16 MED ORDER — HEPARIN SOD (PORK) LOCK FLUSH 100 UNIT/ML IV SOLN
500.0000 [IU] | Freq: Once | INTRAVENOUS | Status: AC
  Administered 2023-10-16: 500 [IU] via INTRAVENOUS

## 2023-10-16 MED ORDER — SODIUM CHLORIDE 0.9 % IV SOLN: Freq: Once | INTRAVENOUS | Status: AC

## 2023-10-16 MED ORDER — SODIUM CHLORIDE 0.9% FLUSH
10.0000 mL | Freq: Once | INTRAVENOUS | Status: AC
  Administered 2023-10-16: 10 mL via INTRAVENOUS

## 2023-10-16 MED ORDER — SODIUM CHLORIDE 0.9% FLUSH
10.0000 mL | Freq: Once | INTRAVENOUS | Status: AC
  Administered 2023-10-16: 10 mL

## 2023-10-16 NOTE — Patient Instructions (Signed)
 Dehydration, Adult Dehydration is a condition in which there is not enough water or other fluids in the body. This happens when a person loses more fluids than they take in. Important organs cannot work right without the right amount of fluids. Any loss of fluids from the body can cause dehydration. Dehydration can be mild, worse, or very bad. It should be treated right away to keep it from getting very bad. What are the causes? Conditions that cause loss of water in the body. They include: Watery poop (diarrhea). Vomiting. Sweating a lot. Fever. Infection. Peeing (urinating) a lot. Not drinking enough fluids. Certain medicines, such as medicines that take extra fluid out of the body (diuretics). Lack of safe drinking water. Not being able to get enough water and food. What increases the risk? Having a long-term (chronic) illness that has not been treated the right way, such as: Diabetes. Heart disease. Kidney disease. Being 25 years of age or older. Having a disability. Living in a place that is high above the ground or sea (high in altitude). The thinner, drier air causes more fluid loss. Doing exercises that put stress on your body for a long time. Being active when in hot places. What are the signs or symptoms? Symptoms of dehydration depend on how bad it is. Mild or worse dehydration Thirst. Dry lips or dry mouth. Feeling dizzy or light-headed. Muscle cramps. Passing little pee or dark pee. Pee may be the color of tea. Headache. Very bad dehydration Changes in skin. Skin may: Be cold to the touch (clammy). Be blotchy or pale. Not go back to normal right after you pinch it and let it go. Little or no tears, pee, or sweat. Fast breathing. Low blood pressure. Weak pulse. Pulse that is more than 100 beats a minute when you are sitting still. Other changes, such as: Feeling very thirsty. Eyes that look hollow (sunken). Cold hands and feet. Being confused. Being very  tired (lethargic) or having trouble waking from sleep. Losing weight. Loss of consciousness. How is this treated? Treatment for this condition depends on how bad your dehydration is. Treatment should start right away. Do not wait until your condition gets very bad. Very bad dehydration is an emergency. You will need to go to a hospital. Mild or worse dehydration can be treated at home. You may be asked to: Drink more fluids. Drink an oral rehydration solution (ORS). This drink gives you the right amount of fluids, salts, and minerals (electrolytes). Very bad dehydration can be treated: With fluids through an IV tube. By correcting low levels of electrolytes in the body. By treating the problem that caused your dehydration. Follow these instructions at home: Oral rehydration solution If told by your doctor, drink an ORS: Make an ORS. Use instructions on the package. Start by drinking small amounts, about  cup (120 mL) every 5-10 minutes. Slowly drink more until you have had the amount that your doctor said to have.  Eating and drinking  Drink enough clear fluid to keep your pee pale yellow. If you were told to drink an ORS, finish the ORS first. Then, start slowly drinking other clear fluids. Drink fluids such as: Water. Do not drink only water. Doing that can make the salt (sodium) level in your body get too low. Water from ice chips you suck on. Fruit juice that you have added water to (diluted). Low-calorie sports drinks. Eat foods that have the right amounts of salts and minerals, such as bananas, oranges, potatoes,  tomatoes, or spinach. Do not drink alcohol. Avoid drinks that have caffeine or sugar. These include:: High-calorie sports drinks. Fruit juice that you did not add water to. Soda. Coffee or energy drinks. Avoid foods that are greasy or have a lot of fat or sugar. General instructions Take over-the-counter and prescription medicines only as told by your doctor. Do  not take sodium tablets. Doing that can make the salt level in your body get too high. Return to your normal activities as told by your doctor. Ask your doctor what activities are safe for you. Keep all follow-up visits. Your doctor may check and change your treatment. Contact a doctor if: You have pain in your belly (abdomen) and the pain: Gets worse. Stays in one place. You have a rash. You have a stiff neck. You get angry or annoyed more easily than normal. You are more tired or have a harder time waking than normal. You feel weak or dizzy. You feel very thirsty. Get help right away if: You have any symptoms of very bad dehydration. You vomit every time you eat or drink. Your vomiting gets worse, does not go away, or you vomit blood or green stuff. You are getting treatment, but symptoms are getting worse. You have a fever. You have a very bad headache. You have: Diarrhea that gets worse or does not go away. Blood in your poop (stool). This may cause poop to look black and tarry. No pee in 6-8 hours. Only a small amount of pee in 6-8 hours, and the pee is very dark. You have trouble breathing. These symptoms may be an emergency. Get help right away. Call 911. Do not wait to see if the symptoms will go away. Do not drive yourself to the hospital. This information is not intended to replace advice given to you by your health care provider. Make sure you discuss any questions you have with your health care provider. Document Revised: 02/12/2022 Document Reviewed: 02/12/2022 Elsevier Patient Education  2024 [employer].

## 2023-10-16 NOTE — Progress Notes (Signed)
 Nutrition Assessment  Reason for Assessment: Referral (wt loss/poor po)   ASSESSMENT: 68 year old female with recurrent lung cancer mets to bone and brain. S/p right VATS/right lobectomy (2018) and whole brain radiation (last RT 08/16/23). Pt currently receiving carboplatin/alimta + keytruda q21d. Patient is under the care of Dr. Arbutus Ped.   Past medical history includes HTN, GERD, DM2, depression, chronic pain, HLD  Met with patient in [facility]. Patient receiving IVF secondary to hypotension related to hypovolemia. Pt is joined by 2 close friends today who participate in her care. Patient reports recent dizzy spells when standing, usually followed by a fall per friend. Pt reports having no appetite and taste changes. Yesterday she ate 1 chicken nugget and 2 pretzels. Pt does not like the taste of Boost/Ensure. She does tolerate milk with cereal, but does not drink by itself often.   Patient began noticing decline in appetite with start of treatment. Pt is insulin dependent, however has not been taking recently. Endorses starting mounjaro in November for wt loss/glucose control. Per chart, weights have been trending down since start of this medicine. Pt reports occasional nausea. Antiemetics work well as needed. She has mild constipation which she attributes to decreased intake.   Nutrition Focused Physical Exam: deferred     Medications: lexapro, folvite, lantus, zestril, namenda, mobic, metformin, zofran, protonix, trazodone, mounjaro (06/25/23)   Labs: glucose 193, BUN 35, Cr 1.08, total bilirubin 1.3   Anthropometrics:   Height: 5'7" Weight: 162 lb 3.2 oz  UBW: 190 lb (08/19/23) BMI: 25.40   NUTRITION DIAGNOSIS: Unintended wt loss related to side effects of therapy as evidenced by 15% wt loss in 3 months - severe for time frame   INTERVENTION:  Educated on strategies for taste changes, suggested trying baking soda salt water rinses several times daily before meals - handout with tips +  recipe Educated on strategies for poor appetite, eating within first hour of waking followed by po q2-3h - handout with snack ideas provided Encourage soft moist foods for ease of intake Suggested CIB mixed with cup of whole fairlife milk as alternate ONS Mounjaro likely contributing to decreased appetite - consider alternate medication (discussed with pt who agrees, has appt with PCP next week and will discuss)   MONITORING, EVALUATION, GOAL: Pt will tolerate increased calories and protein to minimize additional wt loss    Next Visit: Thursday April 3 during infusion

## 2023-10-16 NOTE — Research (Addendum)
 TRIAL A PHASE III TRIAL OF STEREOTACTIC RADIOSURGERY COMPARED WITH HIPPOCAMPAL-AVOIDANT WHOLE BRAIN RADIOTHERAPY (HA-WBRT) PLUS MEMANTINE  FOR 5 OR MORE BRAIN METASTASES   Molly arrives today Accompanied by friends for the 8-week post-treatment visit.    PROs: Per study protocol, all PROs required for this visit were completed prior to other study activities and completeness has been verified.     LABS: Mandatory and optional labs are collected per consent and study protocol: [name] tolerated well without complaint.   MEDICATION REVIEW: Molly reviews and verifies the current medication list is not correct.  All reported changes were recorded on the medication list and marked as reviewed. All other listed medications remain the same. RN Archibald Beard will make adjustments to medication list. Molly is on multiple medications for DM, including Mounjaro , insulin  prn, and metformin . She is also on lisinopril  for HTN but is having orthostatic hypotension now. Sent a message to med onc at Dr Barrie Borer request. Molly requests that memantine  be switched to the [facility] on [facility] in [place]; sent request to Dr [name].  VITAL SIGNS: Vital signs are collected per study protocol.  MD/PROVIDER VISIT: Molly sees Dr [name] for today's visit. Katie with [facility] will also see Molly.   ADVERSE EVENTS: [name] reports AE as below. Attribution per Dr [name].  ADVERSE EVENT LOGSARAJANE Hodge [phone]  10/16/2023  Adverse Event Log  Study/Protocol: CE.7 Cycle: 8 weeks post-treatment  Event Grade Onset Date Resolved Date Attribution to Study Intervention Treatment Comments  Fall Grade 1 10/15/23  Unrelated  Fell today x1  Anorexia Grade 3 10/02/23  Unrelated  Down 32# since study admission in December  Nausea Grade 2 10/02/23  Unrelated    Alopecia  Grade 2 10/02/23  Unrelated    Dysgeusia Grade 2 10/02/23  Unrelated  No food tastes good  Anemia Grade 1 08/21/23  Unrelated   Hemoglobin 10.4 today  Creatinine increased Grade 1 10/08/23  Unrelated  Creatinine 1.08 today  Platelet count decreased Grade 1 10/16/23  Unrelated  Platelets 142 today  Neutrophil count decreased Grade 2 10/16/23  Unrelated  Neutrophils 1.1 today   NEUROCOGNITIVE ASSESSMENT: Neurocognitive assessment is completed by Naoma Bacca.  GIFT CARD: This study does not provide visit compensation.   DISPOSITION: Upon completion off all study requirements, Molly remained in [facility] exam room getting IV fluids.   The Molly was thanked for their time and continued voluntary participation in this study. [name] has been provided direct contact information and is encouraged to contact this Nurse for any needs or questions.  Molly Gravel Toua Stites, RN, BSN, [facility] She  Her  Hers Clinical Research Nurse [facility] Direct Dial [phone] 10/16/2023 2:09 PM   ADDENDUM:   Baseline AEs remain unchanged.  Molly Gravel Gennesis Hogland, RN, BSN, [facility] She  Her  Hers Clinical Research Nurse [employer] Direct Dial [phone] 12/20/2023 3:52 PM

## 2023-10-16 NOTE — Progress Notes (Signed)
 Symptom Management Consult Note [facility]    Patient Care Team: Darrow Bussing, MD as PCP - General (Family Medicine) West Bali, MD (Inactive) as Consulting Physician (Gastroenterology) Loreli Slot, MD as Consulting Physician (Cardiothoracic Surgery)    Name / MRN / DOB: [name]  [id number]  01/03/1956   Date of visit: 10/16/2023   Chief Complaint/Reason for visit: dizziness   Current Therapy: ystemic chemotherapy with carboplatin for AUC of 5, Alimta 500 Mg/M2 and Keytruda 200 Mg IV every 3 weeks   Last treatment:  Day 1   Cycle 3 on 10/08/23   ASSESSMENT & PLAN: Patient is a 68 y.o. female with oncologic history of recurrent/metastatic non-small cell lung cancer, adenocarcinoma followed by Dr. Gwenyth Bouillon  I have viewed most recent oncology note and lab work.    #Recurrent/metastatic non-small cell lung cancer, adenocarcinoma  - Next appointment with oncologist is 10/31/23  #Pancytopenia - Treatment related. CBC 2.1, ANC 1.1, platelets 142k, HgB 10.4. - Emphasized infection prevention and bleeding precautions.   #Chemotherapy-Induced Nausea -Occasional nausea managed with anti-nausea medication, likely a chemotherapy side effect. Prompt medication use advised to prevent worsening. - Continue current anti-nausea medication as needed. - Encourage taking anti-nausea medication at the first sign of nausea, within the appropriate time frame.  #Orthostatic Hypotension - Dizziness likely due to orthostatic hypotension and dehydration. Blood pressure drop upon standing noted. Inadequate fluid intake reported. - Administer 1L NS fluids to improve hydration and blood pressure. On recheck BP not improved and patient is asymptomatic. Will give additional IVF 500. On reassesssment BP is normotensive. Patient eager to leave for lunch. - Advise increasing fluid intake to at least 60 ounces per day. - Instruct to monitor blood pressure at home and  withhold lisinopril if systolic blood pressure is below 110 mmHg.  #Decreased Appetite and Nutritional Intake -CMP shows creatinine is improved compared to last week, 1.08 today (was 1.14). -Decreased appetite and difficulty with food intake could be side effect of chemotherapy  - Appetite stimulants is not ideal in her case as she is on trazodone and Lexapro which can interact. - Encourage trying different types of broth and salty foods to increase nutrient intake. Advise keeping fluids like water or ginger ale nearby to sip throughout the day. - Provide positive reinforcement and suggestions for experimenting with different foods and drinks. - Patient seen by the dietician today who discussed this in more detail. Patient is taking Mounjaro which also is known to suppress appetite. Please see her note for additional details.   Strict ED precautions discussed should symptoms worsen.   Heme/Onc History: Oncology History Overview Note  Patient presented with abnormal LDCT screening 09/17/16.      Stage IV adenocarcinoma of lung, right (HCC)  09/17/2016 Imaging   LDCT screening IMPRESSION: 15.1 mm spiculated posterior right upper lobe nodule, suspicious for primary bronchogenic neoplasm. Lung-RADS Category 4B   09/28/2016 Imaging   PET IMPRESSION: 15 mm spiculated posterior right upper lobe pulmonary nodule is hypermetabolic, consistent with primary bronchogenic neoplasm. No evidence for metastatic disease in the chest, abdomen, or pelvis.   10/15/2016 Surgery   PROCEDURE: 1. Right video-assisted thoracoscopy. 2. Wedge resection, right upper lobe nodule. 3. Thoracoscopic right upper lobectomy. 4. Mediastinal lymph node dissection. 5. On-Q local anesthetic catheter placement.     11/29/2016 Initial Diagnosis   Adenocarcinoma of right lung, stage 1 (HCC)   06/14/2021 Cancer Staging   Staging form: Lung, AJCC 8th Edition - Clinical:  Stage IA1 (cT1a, cN0, cM0) - Signed by Si Gaul, MD on 06/14/2021   08/21/2023 -  Chemotherapy   Patient is on Treatment Plan : LUNG Carboplatin (5) + Pemetrexed (500) + Pembrolizumab (200) D1 q21d Induction x 4 cycles / Maintenance Pemetrexed (500) + Pembrolizumab (200) D1 q21d         Interval history-: Discussed the use of AI scribe software for clinical note transcription with the patient, who gave verbal consent to proceed.   [name] is a 68 y.o. female with oncologic history as above presenting to [facility] today with chief complaint of dizziness. Patient is accompanied by friends who provide additional history.  She experiences dizziness x several days. This morning, her dizziness was particularly severe, causing her to grab the wall for support and eventually fall to her knees. She describes feeling 'totally dizzy' and needing to wait until she could pull herself up. No chest pain or vision changes accompany these episodes. She did not hit her head or loose conciseness. She has not been taking her blood pressure medication, lisinopril, consistently in the morning as usual, but rather in the afternoon lately. She did not take it today but likely took it yesterday afternoon.  Her fluid intake has been low, with only 12 to 16 ounces consumed yesterday. She struggles with drinking due to taste aversions, particularly with protein drinks, which she finds unpalatable. She manages to drink ginger ale and water but finds protein drinks intolerable due to taste. She experiences occasional nausea, for which she carries a bucket and takes medication when needed. The medication helps alleviate her nausea. Her appetite is poor, and she finds it difficult to eat, often feeling turned off by food.  Her bowel movements are infrequent, which she attributes to her low food intake. When she does have a bowel movement, it is often loose, but she may go days without one. She also reports urinating less frequently than before. She denies any fever or  chills.  ROS  All other systems are reviewed and are negative for acute change except as noted in the HPI.    Allergies  Allergen Reactions   Synthroid [Levothyroxine Sodium] Anaphylaxis   Hydrochlorothiazide     cramps   Iodine Rash    I get a skin rash sometimes if iodine applied to my skin.       Past Medical History:  Diagnosis Date   Adenocarcinoma of right lung, stage 1 (HCC) 11/29/2016   Cancer (HCC)    Chronic pain    Depression    takes Lexapro daily   Diabetes mellitus    takes Metformin daily and has an insulin pump.Fasting blood sugar runs250   GERD (gastroesophageal reflux disease)    takes Pantoprazole daily   History of colon polyps    benign   Hyperlipidemia    takes Atorvastatin daily   Hypertension    takes Amlodipine and Lisinopril daily   Insomnia    takes Trazodone nightly   Nocturia    Thyroid disease    Urinary frequency    Urinary urgency      Past Surgical History:  Procedure Laterality Date   ABDOMINAL HYSTERECTOMY     ABDOMINAL SURGERY     BIOPSY  05/28/2019   Procedure: BIOPSY;  Surgeon: West Bali, MD;  [facility];  Service: Endoscopy;;  random colon/gastric/duodenum   BIOPSY  01/11/2023   Procedure: BIOPSY;  Surgeon: Lanelle Bal, DO;  [facility];  Service: Endoscopy;;   COLONOSCOPY  2005 MAC   TICs, IH   COLONOSCOPY N/A 03/08/2014   Procedure: COLONOSCOPY;  Surgeon: West Bali, MD;  [facility];  Service: Endoscopy;  Laterality: N/A;  1015   COLONOSCOPY WITH PROPOFOL N/A 05/28/2019   Procedure: COLONOSCOPY WITH PROPOFOL;  Surgeon: West Bali, MD;  [facility];  Service: Endoscopy;  Laterality: N/A;  9:15am   CYSTOSTOMY  11/22/2011   Procedure: CYSTOSTOMY SUPRAPUBIC;  Surgeon: Ky Barban, MD;  Location: [facility];  Service: Urology;  Laterality: N/A;   ESOPHAGOGASTRODUODENOSCOPY N/A 03/08/2014   Procedure: ESOPHAGOGASTRODUODENOSCOPY (EGD);  Surgeon: West Bali, MD;  [facility];  Service: Endoscopy;  Laterality: N/A;   ESOPHAGOGASTRODUODENOSCOPY     ESOPHAGOGASTRODUODENOSCOPY (EGD) WITH PROPOFOL N/A 05/28/2019   Procedure: ESOPHAGOGASTRODUODENOSCOPY (EGD) WITH PROPOFOL;  Surgeon: West Bali, MD;  [facility];  Service: Endoscopy;  Laterality: N/A;   ESOPHAGOGASTRODUODENOSCOPY (EGD) WITH PROPOFOL N/A 01/11/2023   Procedure: ESOPHAGOGASTRODUODENOSCOPY (EGD) WITH PROPOFOL;  Surgeon: Lanelle Bal, DO;  [facility];  Service: Endoscopy;  Laterality: N/A;  9:45 am, asa 3   GIVENS CAPSULE STUDY N/A 06/15/2019   Procedure: GIVENS CAPSULE STUDY;  Surgeon: West Bali, MD;  [facility];  Service: Endoscopy;  Laterality: N/A;  7:30am   HALLUX VALGUS CORRECTION     bilateral foot surgery for bone repairs-multiple   IR IMAGING GUIDED PORT INSERTION  08/19/2023   LOBECTOMY Right 10/15/2016   Procedure: RIGHT UPPER LOBECTOMY;  Surgeon: Loreli Slot, MD;  Location: [facility] OR;  Service: Thoracic;  Laterality: Right;   LYMPH NODE DISSECTION Right 10/15/2016   Procedure: LYMPH NODE DISSECTION;  Surgeon: Loreli Slot, MD;  Location: [facility] OR;  Service: Thoracic;  Laterality: Right;   POLYPECTOMY  05/28/2019   Procedure: POLYPECTOMY;  Surgeon: West Bali, MD;  [facility];  Service: Endoscopy;;   SAVORY DILATION N/A 05/28/2019   Procedure: SAVORY DILATION;  Surgeon: West Bali, MD;  [facility];  Service: Endoscopy;  Laterality: N/A;  15/16/17   VAGINA RECONSTRUCTION SURGERY     tvt 2006- [place]   VIDEO ASSISTED THORACOSCOPY (VATS)/WEDGE RESECTION Right 10/15/2016   Procedure: RIGHT VIDEO ASSISTED THORACOSCOPY (VATS)/WEDGE RESECTION;  Surgeon: Loreli Slot, MD;  Location: [facility];  Service: Thoracic;  Laterality: Right;    Social History   Socioeconomic History   Marital status: Single    Spouse name: Not on file   Number of children: Not on  file   Years of education: Not on file   Highest education level: Not on file  Occupational History   Not on file  Tobacco Use   Smoking status: Former    Current packs/day: 1.00    Average packs/day: 1 pack/day for 32.0 years (32.0 ttl pk-yrs)    Types: Cigarettes    Passive exposure: Never   Smokeless tobacco: Never   Tobacco comments:    quit smoking 20 yrs ago  Vaping Use   Vaping status: Never Used  Substance and Sexual Activity   Alcohol use: No    Comment: rarely   Drug use: No   Sexual activity: Not on file  Other Topics Concern   Not on file  Social History Narrative   Not on file   Social Drivers of Health   Financial Resource Strain: Not on file  Food Insecurity: No Food Insecurity (07/23/2023)   Hunger  Vital Sign    Worried About Programme researcher, broadcasting/film/video in the Last Year: Never true    Ran Out of Food in the Last Year: Never true  Transportation Needs: No Transportation Needs (07/23/2023)   PRAPARE - Administrator, Civil Service (Medical): No    Lack of Transportation (Non-Medical): No  Physical Activity: Not on file  Stress: Not on file  Social Connections: Not on file  Intimate Partner Violence: Not At Risk (07/23/2023)   Humiliation, Afraid, Rape, and Kick questionnaire    Fear of Current or Ex-Partner: No    Emotionally Abused: No    Physically Abused: No    Sexually Abused: No    Family History  Problem Relation Age of Onset   Asthma Other    Diabetes Other    Arthritis Other    Cancer Mother    Cancer Brother    Anesthesia problems Neg Hx    Hypotension Neg Hx    Malignant hyperthermia Neg Hx    Pseudochol deficiency Neg Hx    Colon polyps Neg Hx    Colon cancer Neg Hx    Celiac disease Neg Hx    Pancreatic cancer Neg Hx    Stomach cancer Neg Hx    Ulcerative colitis Neg Hx    Crohn's disease Neg Hx      Current Outpatient Medications:    ACCU-CHEK AVIVA PLUS test strip, USE TO MONITOR GLUCOSE LEVELS TWICE A DAY E11.9,  Disp: 100 strip, Rfl: 12   ACCU-CHEK SOFTCLIX LANCETS lancets, Use to monitor glucose levels BID; E11.9 (Patient taking differently: 1 each by Other route 2 (two) times daily. E11.9), Disp: 100 each, Rfl: 12   acetaminophen (TYLENOL) 500 MG tablet, Take 1 tablet (500 mg total) by mouth every 6 (six) hours as needed. (Patient taking differently: Take 500 mg by mouth every 6 (six) hours as needed (for pain).), Disp: 30 tablet, Rfl: 0   atorvastatin (LIPITOR) 20 MG tablet, Take 20 mg by mouth daily., Disp: , Rfl: 11   Blood Glucose Calibration (GLUCOSE CONTROL) SOLN, 1 Bottle by In Vitro route as needed. Use to calibrate Accu-Chek Aviva Plus device prn; please provide control solution compatible with Accu-Chek Aviva Plus device. (Patient taking differently: 1 each by Other route as needed (Use to calibrate glucometer). E11.9), Disp: 1 each, Rfl: 1   Blood Glucose Monitoring Suppl (ACCU-CHEK AVIVA PLUS) w/Device KIT, 1 each by Does not apply route 2 (two) times daily. Use to monitor glucose levels BID; E11.9 (Patient taking differently: 1 each by Does not apply route 2 (two) times daily. E11.9), Disp: 1 kit, Rfl: 0   chlorthalidone (HYGROTON) 25 MG tablet, Take 12.5 mg by mouth daily. , Disp: , Rfl:    clobetasol cream (TEMOVATE) 0.05 %, Apply 1 Application topically 2 (two) times daily as needed (itching)., Disp: , Rfl:    cyclobenzaprine (FLEXERIL) 10 MG tablet, TAKE 1 TABLET BY MOUTH TWICE A DAY AS NEEDED FOR MUSCLE SPASMS, Disp: , Rfl:    diclofenac Sodium (VOLTAREN ARTHRITIS PAIN) 1 % GEL, Apply 4 g topically 4 (four) times daily. (Patient taking differently: Apply 4 g topically 3 (three) times daily as needed (joint pain).), Disp: 150 g, Rfl: 0   escitalopram (LEXAPRO) 10 MG tablet, Take 1 tablet (10 mg total) by mouth every morning., Disp: 90 tablet, Rfl: 3   folic acid (FOLVITE) 1 MG tablet, Take 1 tablet (1 mg total) by mouth daily. Start 7 days before pemetrexed chemotherapy.  Continue until 21 days  after pemetrexed completed., Disp: 100 tablet, Rfl: 3   HYDROcodone bit-homatropine (HYCODAN) 5-1.5 MG/5ML syrup, Take 5 mLs by mouth every 6 (six) hours as needed for cough., Disp: 120 mL, Rfl: 0   ibuprofen (ADVIL) 800 MG tablet, Take 800 mg by mouth every 8 (eight) hours as needed for moderate pain., Disp: , Rfl:    insulin glargine (LANTUS SOLOSTAR) 100 UNIT/ML Solostar Pen, Inject 15 Units into the skin at bedtime., Disp: 15 mL, Rfl: 3   Insulin Pen Needle (B-D ULTRAFINE III SHORT PEN) 31G X 8 MM MISC, USE AS DIRECTED, Disp: 100 each, Rfl: 5   ipratropium (ATROVENT) 0.03 % nasal spray, Place 2 sprays into both nostrils 2 (two) times daily., Disp: , Rfl:    lidocaine-prilocaine (EMLA) cream, Apply to affected area once, Disp: 30 g, Rfl: 3   lisinopril (ZESTRIL) 10 MG tablet, Take 10 mg by mouth daily., Disp: , Rfl:    meloxicam (MOBIC) 7.5 MG tablet, Take 7.5 mg by mouth daily., Disp: , Rfl:    memantine (NAMENDA TITRATION PACK) tablet pack, 5 mg/day for =1 week; 5 mg twice daily for =1 week; 15 mg/day given in 5 mg and 10 mg separated doses for =1 week; then 10 mg twice daily. Take through 01/20/2024, then stop., Disp: 49 tablet, Rfl: 12   metFORMIN (GLUCOPHAGE-XR) 500 MG 24 hr tablet, Take 1 tablet (500 mg total) by mouth daily with breakfast., Disp: 90 tablet, Rfl: 3   ondansetron (ZOFRAN) 8 MG tablet, Take 1 tablet (8 mg total) by mouth every 8 (eight) hours as needed for nausea or vomiting. Start on the third day after carboplatin., Disp: 30 tablet, Rfl: 1   pantoprazole (PROTONIX) 40 MG tablet, Take 1 tablet (40 mg total) by mouth 2 (two) times daily., Disp: 180 tablet, Rfl: 3   prochlorperazine (COMPAZINE) 10 MG tablet, Take 1 tablet (10 mg total) by mouth every 6 (six) hours as needed for nausea or vomiting., Disp: 30 tablet, Rfl: 1   tirzepatide (MOUNJARO) 15 MG/0.5ML Pen, Inject 15 mg into the skin once a week., Disp: 6 mL, Rfl: 3   traZODone (DESYREL) 100 MG tablet, Take 2 tablets (200  mg total) by mouth at bedtime., Disp: 180 tablet, Rfl: 3   triamcinolone cream (KENALOG) 0.1 %, Apply 1 application topically 2 (two) times daily. (Patient taking differently: Apply 1 application  topically 2 (two) times daily as needed (irritation).), Disp: 30 g, Rfl: 0  PHYSICAL EXAM: ECOG FS:1 - Symptomatic but completely ambulatory    Vitals:   10/16/23 1312 10/16/23 1315 10/16/23 1405 10/16/23 1407  BP: 123/83 (!) 86/57 122/85 103/62  Pulse: 80 (!) 107 64 88  Resp:    16  Temp:      TempSrc:      SpO2:   100% 100%  Weight:       Physical Exam Vitals and nursing note reviewed.  Constitutional:      Appearance: She is not ill-appearing or toxic-appearing.  HENT:     Head: Normocephalic.     Mouth/Throat:     Mouth: Mucous membranes are dry.  Eyes:     Conjunctiva/sclera: Conjunctivae normal.  Cardiovascular:     Rate and Rhythm: Normal rate and regular rhythm.     Pulses: Normal pulses.     Heart sounds: Normal heart sounds.  Pulmonary:     Effort: Pulmonary effort is normal.     Breath sounds: Normal breath sounds.  Abdominal:  General: Bowel sounds are normal. There is no distension.     Palpations: Abdomen is soft.     Tenderness: There is no abdominal tenderness.  Musculoskeletal:     Cervical back: Normal range of motion.  Skin:    General: Skin is warm and dry.  Neurological:     Mental Status: She is alert.        LABORATORY DATA: I have reviewed the data as listed    Latest Ref Rng & Units 10/16/2023   10:37 AM 10/08/2023    8:26 AM 10/02/2023   10:49 AM  CBC  WBC 4.0 - 10.5 K/uL 2.1  5.7  2.0   Hemoglobin 12.0 - 15.0 g/dL 27.2  53.6  9.8   Hematocrit 36.0 - 46.0 % 30.7  33.7  29.8   Platelets 150 - 400 K/uL 142  257  270         Latest Ref Rng & Units 10/16/2023   10:37 AM 10/08/2023    8:26 AM 10/02/2023   10:49 AM  CMP  Glucose 70 - 99 mg/dL 644  034  742   BUN 8 - 23 mg/dL 35  20  10   Creatinine 0.44 - 1.00 mg/dL 5.95  6.38  7.56    Sodium 135 - 145 mmol/L 137  140  135   Potassium 3.5 - 5.1 mmol/L 3.7  3.5  3.6   Chloride 98 - 111 mmol/L 99  100  101   CO2 22 - 32 mmol/L 27  29  28    Calcium 8.9 - 10.3 mg/dL 9.2  9.0  8.6   Total Protein 6.5 - 8.1 g/dL 6.6  6.8  6.5   Total Bilirubin 0.0 - 1.2 mg/dL 1.3  0.6  0.4   Alkaline Phos 38 - 126 U/L 69  65  59   AST 15 - 41 U/L 23  28  33   ALT 0 - 44 U/L 30  20  14         RADIOGRAPHIC STUDIES (from last 24 hours if applicable) I have personally reviewed the radiological images as listed and agreed with the findings in the report. No results found.      Visit Diagnosis: 1. Hypotension due to hypovolemia   2. Stage IV adenocarcinoma of lung, right (HCC)   3. Other pancytopenia (HCC)      No orders of the defined types were placed in this encounter.   All questions were answered. The patient knows to call the clinic with any problems, questions or concerns. No barriers to learning was detected.  A total of more than 30 minutes were spent on this encounter with face-to-face time and non-face-to-face time, including preparing to see the patient, ordering tests and/or medications, counseling the patient and coordination of care as outlined above.    Thank you for allowing me to participate in the care of this patient.    Shanon Ace, PA-C Department of Hematology/Oncology [facility] at [facility] Phone: [phone]  [phone]    10/16/2023 2:25 PM

## 2023-10-16 NOTE — Research (Addendum)
 This is a duplicate note. Please see the other note made by research RN on this date. TRIAL A PHASE III TRIAL OF STEREOTACTIC RADIOSURGERY COMPARED WITH HIPPOCAMPAL-AVOIDANT WHOLE BRAIN RADIOTHERAPY (HA-WBRT) PLUS MEMANTINE  FOR 5 OR MORE BRAIN METASTASES   Patient arrives today Accompanied by friends for the 8-week post-treatment visit.    PROs: Per study protocol, all PROs required for this visit were completed prior to other study activities and completeness has been verified.     LABS: Mandatory and optional labs are collected per consent and study protocol: [name] tolerated well without complaint.   MEDICATION REVIEW: Patient reviews and verifies the current medication list is not correct.  All reported changes were recorded on the medication list and marked as reviewed. All other listed medications remain the same. RN Archibald Beard will make adjustments to medication list. Patient is on multiple medications for DM, including Mounjaro , insulin  prn, and metformin . She is also on lisinopril  for HTN but is having orthostatic hypotension now. Sent a message to med onc at Dr Barrie Borer request. Patient requests that memantine  be switched to the [facility] on [address] in [place]; sent request to Dr [name].  VITAL SIGNS: Vital signs are collected per study protocol.  MD/PROVIDER VISIT: Patient sees Dr [name] for today's visit. Katie with [facility] will also see patient.   ADVERSE EVENTS: [name] reports AE as below. Attribution per Dr [name].  ADVERSE EVENT LOGNEVE BRANSCOMB [id number]  10/16/2023  Adverse Event Log  Study/Protocol: CE.7 Cycle: 8 weeks post-treatment  Event Grade Onset Date Resolved Date Attribution to Study Intervention Treatment Comments  Fall Grade 1 10/15/23  Unrelated  Fell today x1  Anorexia Grade 3 10/02/23  Unrelated  Down 32# since study admission in December  Nausea Grade 2 10/02/23  Unrelated    Alopecia  Grade 2 10/02/23  Unrelated    Dysgeusia  Grade 2 10/02/23  Unrelated  No food tastes good  Anemia Grade 1 08/21/23  Unrelated  Hemoglobin 10.4 today  Creatinine increased Grade 1 10/08/23  Unrelated  Creatinine 1.08 today  Platelet count decreased Grade 1 10/16/23  Unrelated  Platelets 142 today  Neutrophil count decreased Grade 2 10/16/23  Unrelated  Neutrophils 1.1 today   NEUROCOGNITIVE ASSESSMENT: Neurocognitive assessment is completed by Naoma Bacca.  GIFT CARD: This study does not provide visit compensation.   DISPOSITION: Upon completion off all study requirements, patient remained in [facility] exam room getting IV fluids.   The patient was thanked for their time and continued voluntary participation in this study. [name] has been provided direct contact information and is encouraged to contact this Nurse for any needs or questions.  Victory Gravel Molly Goldberger, RN, BSN, [facility]  [facility] Clinical Research Nurse [facility] Direct Dial [phone] 10/16/2023 2:12 PM

## 2023-10-17 ENCOUNTER — Encounter: Payer: Self-pay | Admitting: Internal Medicine

## 2023-10-17 ENCOUNTER — Telehealth: Payer: Self-pay

## 2023-10-17 NOTE — Telephone Encounter (Signed)
 Received a call from [facility] in regards to patient's cancer policy requesting records of PET scan, Bx, chemotherapy and radiation. Informed [employer] of the head of HIM's number [phone]  to better assist them. Informed them that the patient needs to sign a release of records form for the information.

## 2023-10-18 ENCOUNTER — Other Ambulatory Visit: Payer: Self-pay | Admitting: Radiation Oncology

## 2023-10-18 ENCOUNTER — Telehealth: Payer: Self-pay

## 2023-10-18 DIAGNOSIS — C7931 Secondary malignant neoplasm of brain: Secondary | ICD-10-CM

## 2023-10-18 MED ORDER — MEMANTINE HCL 10 MG PO TABS: 10.0000 mg | ORAL_TABLET | Freq: Two times a day (BID) | ORAL | 0 refills | Status: DC

## 2023-10-18 NOTE — Progress Notes (Signed)
 Radiation Oncology         [phone] ________________________________  Name: ROZELLA SERVELLO MRN: [id number]  Date: 10/16/2023  DOB: 1956/07/25  Follow-Up Visit Note  Outpatient  CC: Koirala, Dibas, MD  Darrow Bussing, MD  Diagnosis and Prior Radiotherapy:    ICD-10-CM   1. Metastatic cancer to brain (HCC)  C79.31      STAGE IV NSCLC  CHIEF COMPLAINT: Here for follow-up and surveillance of brain cancer  Narrative:  The patient returns today for routine follow-up.  The patient's friend called nursing yesterday with concerns about anorexia and concerns that she was "actively dying."  Therefore an appointment was made with her symptom management clinic this morning she is found to be dehydrated and orthostatic and is currently receiving IV fluids.  Fortunately the patient does not appear to be actively dying today; she denies any headaches but does report some nausea.  Medical oncology believes that many of her symptoms are likely related to her systemic therapy.  She reports a suboptimal diet and taste changes.  She denies any new focal neurologic deficits.  MRI of the brain performed earlier this month was personally reviewed by me.  The official report is still pending.  To my eye she has partial response to whole brain radiation.  Some lesions are slightly smaller and some are slightly larger.  There are some increased T2 change consistent with peritumoral edema but no mass effect.         The patient reports that she is still left-handed.                       ALLERGIES:  is allergic to synthroid [levothyroxine sodium], hydrochlorothiazide, and iodine.  Meds: Current Outpatient Medications  Medication Sig Dispense Refill   ACCU-CHEK AVIVA PLUS test strip USE TO MONITOR GLUCOSE LEVELS TWICE A DAY E11.9 100 strip 12   ACCU-CHEK SOFTCLIX LANCETS lancets Use to monitor glucose levels BID; E11.9 (Patient taking differently: 1 each by Other route 2 (two) times daily. E11.9) 100 each  12   acetaminophen (TYLENOL) 500 MG tablet Take 1 tablet (500 mg total) by mouth every 6 (six) hours as needed. (Patient taking differently: Take 500 mg by mouth every 6 (six) hours as needed (for pain).) 30 tablet 0   atorvastatin (LIPITOR) 20 MG tablet Take 20 mg by mouth daily.  11   Blood Glucose Calibration (GLUCOSE CONTROL) SOLN 1 Bottle by In Vitro route as needed. Use to calibrate Accu-Chek Aviva Plus device prn; please provide control solution compatible with Accu-Chek Aviva Plus device. (Patient taking differently: 1 each by Other route as needed (Use to calibrate glucometer). E11.9) 1 each 1   Blood Glucose Monitoring Suppl (ACCU-CHEK AVIVA PLUS) w/Device KIT 1 each by Does not apply route 2 (two) times daily. Use to monitor glucose levels BID; E11.9 (Patient taking differently: 1 each by Does not apply route 2 (two) times daily. E11.9) 1 kit 0   chlorthalidone (HYGROTON) 25 MG tablet Take 12.5 mg by mouth daily.      clobetasol cream (TEMOVATE) 0.05 % Apply 1 Application topically 2 (two) times daily as needed (itching).     cyclobenzaprine (FLEXERIL) 10 MG tablet TAKE 1 TABLET BY MOUTH TWICE A DAY AS NEEDED FOR MUSCLE SPASMS     diclofenac Sodium (VOLTAREN ARTHRITIS PAIN) 1 % GEL Apply 4 g topically 4 (four) times daily. (Patient taking differently: Apply 4 g topically 3 (three) times daily as needed (joint pain).)  150 g 0   escitalopram (LEXAPRO) 10 MG tablet Take 1 tablet (10 mg total) by mouth every morning. 90 tablet 3   folic acid (FOLVITE) 1 MG tablet Take 1 tablet (1 mg total) by mouth daily. Start 7 days before pemetrexed chemotherapy. Continue until 21 days after pemetrexed completed. 100 tablet 3   HYDROcodone bit-homatropine (HYCODAN) 5-1.5 MG/5ML syrup Take 5 mLs by mouth every 6 (six) hours as needed for cough. 120 mL 0   ibuprofen (ADVIL) 800 MG tablet Take 800 mg by mouth every 8 (eight) hours as needed for moderate pain.     insulin glargine (LANTUS SOLOSTAR) 100 UNIT/ML  Solostar Pen Inject 15 Units into the skin at bedtime. 15 mL 3   Insulin Pen Needle (B-D ULTRAFINE III SHORT PEN) 31G X 8 MM MISC USE AS DIRECTED 100 each 5   ipratropium (ATROVENT) 0.03 % nasal spray Place 2 sprays into both nostrils 2 (two) times daily.     lidocaine-prilocaine (EMLA) cream Apply to affected area once 30 g 3   lisinopril (ZESTRIL) 10 MG tablet Take 10 mg by mouth daily.     meloxicam (MOBIC) 7.5 MG tablet Take 7.5 mg by mouth daily.     memantine (NAMENDA TITRATION PACK) tablet pack 5 mg/day for =1 week; 5 mg twice daily for =1 week; 15 mg/day given in 5 mg and 10 mg separated doses for =1 week; then 10 mg twice daily. Take through 01/20/2024, then stop. 49 tablet 12   metFORMIN (GLUCOPHAGE-XR) 500 MG 24 hr tablet Take 1 tablet (500 mg total) by mouth daily with breakfast. 90 tablet 3   ondansetron (ZOFRAN) 8 MG tablet Take 1 tablet (8 mg total) by mouth every 8 (eight) hours as needed for nausea or vomiting. Start on the third day after carboplatin. 30 tablet 1   pantoprazole (PROTONIX) 40 MG tablet Take 1 tablet (40 mg total) by mouth 2 (two) times daily. 180 tablet 3   prochlorperazine (COMPAZINE) 10 MG tablet Take 1 tablet (10 mg total) by mouth every 6 (six) hours as needed for nausea or vomiting. 30 tablet 1   tirzepatide (MOUNJARO) 15 MG/0.5ML Pen Inject 15 mg into the skin once a week. 6 mL 3   traZODone (DESYREL) 100 MG tablet Take 2 tablets (200 mg total) by mouth at bedtime. 180 tablet 3   triamcinolone cream (KENALOG) 0.1 % Apply 1 application topically 2 (two) times daily. (Patient taking differently: Apply 1 application  topically 2 (two) times daily as needed (irritation).) 30 g 0   No current facility-administered medications for this encounter.    Physical Findings: The patient is in no acute distress. Patient is alert and oriented.  vitals were not taken for this visit. .    General: Alert and oriented, in no acute distress HEENT: Alopecia.  There is a  protuberance over the right skull with the patient reports is chronic from childhood Extraocular movements are intact. Oropharynx is clear. Heart: Regular in rate and rhythm with no murmurs, rubs, or gallops. Chest: Good airflow bilaterally Abdomen: Soft, nontender, nondistended, with no rigidity or guarding. Extremities: No cyanosis or edema.  Skin: No concerning lesions. Musculoskeletal: symmetric strength and muscle tone throughout. Neurologic: Cranial nerves II through XII are grossly intact. No obvious focalities. Speech is fluent. Coordination is intact. Psychiatric: Judgment and insight are intact. Affect is appropriate.  KPS = 70  100 - Normal; no complaints; no evidence of disease. 90   - Able to carry on  normal activity; minor signs or symptoms of disease. 80   - Normal activity with effort; some signs or symptoms of disease. 53   - Cares for self; unable to carry on normal activity or to do active work. 60   - Requires occasional assistance, but is able to care for most of his personal needs. 50   - Requires considerable assistance and frequent medical care. 40   - Disabled; requires special care and assistance. 30   - Severely disabled; hospital admission is indicated although death not imminent. 20   - Very sick; hospital admission necessary; active supportive treatment necessary. 10   - Moribund; fatal processes progressing rapidly. 0     - Dead  Karnofsky DA, Abelmann WH, Craver LS and Burchenal [facility] [phone]) The use of the nitrogen mustards in the palliative treatment of carcinoma: with particular reference to bronchogenic carcinoma Cancer 1 634-56  ECOG = 2  0 - Asymptomatic (Fully active, able to carry on all predisease activities without restriction)  1 - Symptomatic but completely ambulatory (Restricted in physically strenuous activity but ambulatory and able to carry out work of a light or sedentary nature. For example, light housework, office work)  2 - Symptomatic,  <50% in bed during the day (Ambulatory and capable of all self care but unable to carry out any work activities. Up and about more than 50% of waking hours)  3 - Symptomatic, >50% in bed, but not bedbound (Capable of only limited self-care, confined to bed or chair 50% or more of waking hours)  4 - Bedbound (Completely disabled. Cannot carry on any self-care. Totally confined to bed or chair)  5 - Death   Santiago Glad MM, Creech RH, Tormey DC, et al. [phone]). "Toxicity and response criteria of the [facility] Group". Am. Evlyn Clines. Oncol. 5 (6): 649-55    Lab Findings: Lab Results  Component Value Date   WBC 2.1 (L) 10/16/2023   HGB 10.4 (L) 10/16/2023   HCT 30.7 (L) 10/16/2023   MCV 76.2 (L) 10/16/2023   PLT 142 (L) 10/16/2023    Radiographic Findings: MR Brain W Wo Contrast Result Date: 10/17/2023 CLINICAL DATA:  68 year old female with metastatic non-small cell lung cancer. Status post radiation treatment in January. EXAM: MRI HEAD WITHOUT AND WITH CONTRAST TECHNIQUE: Multiplanar, multiecho pulse sequences of the brain and surrounding structures were obtained without and with intravenous contrast. CONTRAST:  8mL GADAVIST GADOBUTROL 1 MMOL/ML IV SOLN COMPARISON:  07/30/2023 treatment planning study. FINDINGS: Brain: Black blood postcontrast imaging series 1100. Innumerable enhancing brain metastases as noted previously. The largest lesions at the bilateral superior convexities ranging from 10-13 mm (series 1100, images 221, 231, 238) have not significantly changed. Central bi-thalamic metastases redemonstrated. That on the right is slightly smaller now, 7 mm versus 9 mm previously. Contralateral left thalamic lesion has mildly enlarged, 8 mm and more rim enhancing now versus solid and 4-5 mm previously (series 1100, image 185). Left lateral temporal lobe dominant lesion also appears slightly larger and more confluent now, 11 mm on series 1100, image 151 versus 9-10 mm previously).  Dominant cerebellar lesion located posteriorly in the left hemisphere is smaller now 7-8 mm on series 1100, image 131 versus 11 mm previously. No obvious brand new lesion. No pachymeningeal or leptomeningeal thickening or enhancement is identified. Associated DWI abnormality with the metastases consistent with hypercellularity. No overt hemorrhagic lesion. On axial FLAIR imaging now confluent T2 and FLAIR hyperintensity resembling cytotoxic edema has progressed in the bilateral superior  frontal lobes (series 4 image 39). Similar edema in the left lateral temporal lobe and at the junction of the right posterior temporal and occipital lobes has only mildly progressed since December. And there is no significant cerebellar edema. No superimposed restricted diffusion suggestive of acute infarction. No midline shift, ventriculomegaly, extra-axial collection or acute intracranial hemorrhage. Cervicomedullary junction and pituitary are within normal limits. Vascular: Major intracranial vascular flow voids are preserved. Skull and upper cervical spine: Visualized bone marrow signal is within normal limits. Negative visible cervical spine, spinal cord. Sinuses/Orbits: Mild paranasal sinus mucosal thickening and/or fluid has increased. Orbits appear stable and negative. Other: Mild mastoid effusions have increased, greater on the left. Trace retained secretions in the nasopharynx. IMPRESSION: Innumerable brain metastases with mixed response to therapy compared to 07/30/2023 planning MRI: Most of the largest lesions are stable (individually up to 13 mm). Occasional metastases are slightly larger (left thalamus, left lateral temporal lobe). While other lesions are slightly smaller. No obvious brand new metastasis. But progressed and now confluent T2/FLAIR hyperintensity in the superior frontal lobes, most resembling vasogenic edema. But no significant intracranial mass effect. Electronically Signed   By: Odessa Fleming M.D.   On:  10/17/2023 09:18    Impression/Plan:   We will proceed with surveillance MRI of the brain as scheduled for May and I will see her again after those images.  The official report from her MRI did come out after I saw the patient and it is consistent with my personal impressions.  Overall her disease is relatively stable with some metastases slightly larger since treatment and some slightly smaller.  There is a little increase in the T2 flair abnormality but no significant mass effect.  We will continue to monitor.  Medical oncology will continue to actively manage her side effects related to systemic therapy.  I am hopeful that the IV fluids that she is receiving today will be helpful and she will push her calories and protein by mouth at home as able.  On date of service, in total, I spent 30 minutes on this encounter. Patient was seen in person.  Note signed after encounter date; minutes pertain to date of service, only.  _____________________________________   Lonie Peak, MD

## 2023-10-18 NOTE — Telephone Encounter (Addendum)
 A PHASE III TRIAL OF STEREOTACTIC RADIOSURGERY COMPARED WITH HIPPOCAMPAL-AVOIDANT WHOLE BRAIN RADIOTHERAPY (HA-WBRT) PLUS MEMANTINE FOR 5 OR MORE BRAIN METASTASES  On lab review, BUN >20 is noted, which requires patient to hold study drug and repeat labs in a week. Notified  Dr Basilio Cairo. Attempted to reach patient to update her; no answer, left VM.  Margret Chance Zuma Hust, RN, BSN, [facility] She  Her  Hers Clinical Research Nurse [facility] Direct Dial [phone] 10/18/2023 11:29 AM  Patient returned my call. She will hold memantine until we recheck her BUN next week.  Margret Chance Xochilth Standish, RN, BSN, [facility] She  Her  Hers Clinical Research Nurse [facility] Direct Dial [phone] 10/18/2023 11:35 AM

## 2023-10-18 NOTE — Telephone Encounter (Signed)
 Called patient at Dr Colletta [place] request to let her know that memantine has been called in to the [facility] on [address] in [place]. Patient verbalized understanding.  Margret Chance Jaielle Dlouhy, RN, BSN, [facility] She  Her  Hers Clinical Research Nurse [facility] Direct Dial [phone] 10/18/2023 9:49 AM

## 2023-10-21 NOTE — Progress Notes (Signed)
 Hi Dr. Basilio Cairo,  I called and let her know about the MRI being consistent w/ your assessment and for her to rescan in May. Thank you

## 2023-10-22 ENCOUNTER — Other Ambulatory Visit: Payer: Self-pay | Admitting: Internal Medicine

## 2023-10-22 ENCOUNTER — Ambulatory Visit (HOSPITAL_COMMUNITY)
Admission: RE | Admit: 2023-10-22 | Discharge: 2023-10-22 | Disposition: A | Discharge status: Home / Self Care | Source: Ambulatory Visit | Attending: Internal Medicine | Admitting: Internal Medicine

## 2023-10-22 ENCOUNTER — Telehealth: Payer: Self-pay | Admitting: Medical Oncology

## 2023-10-22 DIAGNOSIS — C3490 Malignant neoplasm of unspecified part of unspecified bronchus or lung: Secondary | ICD-10-CM | POA: Diagnosis present

## 2023-10-22 DIAGNOSIS — E861 Hypovolemia: Secondary | ICD-10-CM

## 2023-10-22 DIAGNOSIS — C3491 Malignant neoplasm of unspecified part of right bronchus or lung: Secondary | ICD-10-CM

## 2023-10-22 MED ORDER — IOHEXOL 300 MG/ML  SOLN
100.0000 mL | Freq: Once | INTRAMUSCULAR | Status: AC | PRN
  Administered 2023-10-22: 100 mL via INTRAVENOUS

## 2023-10-22 MED ORDER — APIXABAN (ELIQUIS) VTE STARTER PACK (10MG AND 5MG): ORAL_TABLET | ORAL | 0 refills | Status: DC

## 2023-10-22 NOTE — Telephone Encounter (Signed)
 Pulmonary Embolism education provided . Pt voiced understanding.Marland Kitchen

## 2023-10-23 ENCOUNTER — Inpatient Hospital Stay

## 2023-10-23 ENCOUNTER — Ambulatory Visit (INDEPENDENT_AMBULATORY_CARE_PROVIDER_SITE_OTHER): Payer: Medicare Other | Admitting: Nurse Practitioner

## 2023-10-23 ENCOUNTER — Ambulatory Visit: Payer: Medicare Other

## 2023-10-23 ENCOUNTER — Encounter: Payer: Self-pay | Admitting: Nurse Practitioner

## 2023-10-23 ENCOUNTER — Telehealth: Payer: Self-pay

## 2023-10-23 ENCOUNTER — Ambulatory Visit: Payer: Medicare Other | Admitting: Physician Assistant

## 2023-10-23 ENCOUNTER — Inpatient Hospital Stay: Admitting: Dietician

## 2023-10-23 ENCOUNTER — Other Ambulatory Visit: Payer: Medicare Other

## 2023-10-23 VITALS — BP 92/74 | HR 60 | Ht 67.0 in | Wt 164.6 lb

## 2023-10-23 DIAGNOSIS — E782 Mixed hyperlipidemia: Secondary | ICD-10-CM | POA: Diagnosis not present

## 2023-10-23 DIAGNOSIS — E559 Vitamin D deficiency, unspecified: Secondary | ICD-10-CM

## 2023-10-23 DIAGNOSIS — Z794 Long term (current) use of insulin: Secondary | ICD-10-CM

## 2023-10-23 DIAGNOSIS — Z7984 Long term (current) use of oral hypoglycemic drugs: Secondary | ICD-10-CM

## 2023-10-23 DIAGNOSIS — E119 Type 2 diabetes mellitus without complications: Secondary | ICD-10-CM | POA: Diagnosis not present

## 2023-10-23 DIAGNOSIS — I10 Essential (primary) hypertension: Secondary | ICD-10-CM

## 2023-10-23 DIAGNOSIS — Z7985 Long-term (current) use of injectable non-insulin antidiabetic drugs: Secondary | ICD-10-CM

## 2023-10-23 LAB — POCT GLYCOSYLATED HEMOGLOBIN (HGB A1C): Hemoglobin A1C: 7.8 % — AB (ref 4.0–5.6)

## 2023-10-23 NOTE — Telephone Encounter (Signed)
 A PHASE III TRIAL OF STEREOTACTIC RADIOSURGERY COMPARED WITH HIPPOCAMPAL-AVOIDANT WHOLE BRAIN RADIOTHERAPY (HA-WBRT) PLUS MEMANTINE FOR 5 OR MORE BRAIN METASTASES  Spoke with patient about appt today. She reports it got moved to next week, 10/31/23, and she will be here then. She is feeling better but can't come in before then. Spoke with Dr Basilio Cairo; she wants pt to continue to hold memantine until labs are rechecked.  Molly Chance Rasheeda Mulvehill, RN, BSN, [facility] She  Her  Hers Clinical Research Nurse [facility] Direct Dial [phone] 10/23/2023 4:38 PM

## 2023-10-23 NOTE — Progress Notes (Signed)
 Endocrinology Follow Up Note       10/23/2023, 8:28 AM   Subjective:    Patient ID: [name], female    DOB: [birth year].  [name] is being seen in follow up after being seen in consultation for management of currently uncontrolled symptomatic diabetes requested by  Darrow Bussing, MD.   Past Medical History:  Diagnosis Date   Adenocarcinoma of right lung, stage 1 (HCC) 11/29/2016   Cancer (HCC)    Chronic pain    Depression    takes Lexapro daily   Diabetes mellitus    takes Metformin daily and has an insulin pump.Fasting blood sugar runs250   GERD (gastroesophageal reflux disease)    takes Pantoprazole daily   History of colon polyps    benign   Hyperlipidemia    takes Atorvastatin daily   Hypertension    takes Amlodipine and Lisinopril daily   Insomnia    takes Trazodone nightly   Nocturia    Thyroid disease    Urinary frequency    Urinary urgency     Past Surgical History:  Procedure Laterality Date   ABDOMINAL HYSTERECTOMY     ABDOMINAL SURGERY     BIOPSY  05/28/2019   Procedure: BIOPSY;  Surgeon: West Bali, MD;  [facility];  Service: Endoscopy;;  random colon/gastric/duodenum   BIOPSY  01/11/2023   Procedure: BIOPSY;  Surgeon: Lanelle Bal, DO;  [facility];  Service: Endoscopy;;   COLONOSCOPY  2005 MAC   TICs, IH   COLONOSCOPY N/A 03/08/2014   Procedure: COLONOSCOPY;  Surgeon: West Bali, MD;  [facility]: [facility];  Service: Endoscopy;  Laterality: N/A;  1015   COLONOSCOPY WITH PROPOFOL N/A 05/28/2019   Procedure: COLONOSCOPY WITH PROPOFOL;  Surgeon: West Bali, MD;  [facility];  Service: Endoscopy;  Laterality: N/A;  9:15am   CYSTOSTOMY  11/22/2011   Procedure: CYSTOSTOMY SUPRAPUBIC;  Surgeon: Ky Barban, MD;  Location: [facility];  Service: Urology;  Laterality: N/A;   ESOPHAGOGASTRODUODENOSCOPY N/A 03/08/2014    Procedure: ESOPHAGOGASTRODUODENOSCOPY (EGD);  Surgeon: West Bali, MD;  [facility];  Service: Endoscopy;  Laterality: N/A;   ESOPHAGOGASTRODUODENOSCOPY     ESOPHAGOGASTRODUODENOSCOPY (EGD) WITH PROPOFOL N/A 05/28/2019   Procedure: ESOPHAGOGASTRODUODENOSCOPY (EGD) WITH PROPOFOL;  Surgeon: West Bali, MD;  [facility];  Service: Endoscopy;  Laterality: N/A;   ESOPHAGOGASTRODUODENOSCOPY (EGD) WITH PROPOFOL N/A 01/11/2023   Procedure: ESOPHAGOGASTRODUODENOSCOPY (EGD) WITH PROPOFOL;  Surgeon: Lanelle Bal, DO;  [facility];  Service: Endoscopy;  Laterality: N/A;  9:45 am, asa 3   GIVENS CAPSULE STUDY N/A 06/15/2019   Procedure: GIVENS CAPSULE STUDY;  Surgeon: West Bali, MD;  [facility];  Service: Endoscopy;  Laterality: N/A;  7:30am   HALLUX VALGUS CORRECTION     bilateral foot surgery for bone repairs-multiple   IR IMAGING GUIDED PORT INSERTION  08/19/2023   LOBECTOMY Right 10/15/2016   Procedure: RIGHT UPPER LOBECTOMY;  Surgeon: Loreli Slot, MD;  [facility];  Service: Thoracic;  Laterality: Right;   LYMPH NODE DISSECTION Right 10/15/2016   Procedure: LYMPH NODE  DISSECTION;  Surgeon: Loreli Slot, MD;  Location: [facility] OR;  Service: Thoracic;  Laterality: Right;   POLYPECTOMY  05/28/2019   Procedure: POLYPECTOMY;  Surgeon: West Bali, MD;  [facility];  Service: Endoscopy;;   SAVORY DILATION N/A 05/28/2019   Procedure: SAVORY DILATION;  Surgeon: West Bali, MD;  [facility];  Service: Endoscopy;  Laterality: N/A;  15/16/17   VAGINA RECONSTRUCTION SURGERY     tvt 2006- [place]   VIDEO ASSISTED THORACOSCOPY (VATS)/WEDGE RESECTION Right 10/15/2016   Procedure: RIGHT VIDEO ASSISTED THORACOSCOPY (VATS)/WEDGE RESECTION;  Surgeon: Loreli Slot, MD;  Location: [facility];  Service: Thoracic;  Laterality: Right;    Social History   Socioeconomic History   Marital status:  Single    Spouse name: Not on file   Number of children: Not on file   Years of education: Not on file   Highest education level: Not on file  Occupational History   Not on file  Tobacco Use   Smoking status: Former    Current packs/day: 1.00    Average packs/day: 1 pack/day for 32.0 years (32.0 ttl pk-yrs)    Types: Cigarettes    Passive exposure: Never   Smokeless tobacco: Never   Tobacco comments:    quit smoking 20 yrs ago  Vaping Use   Vaping status: Never Used  Substance and Sexual Activity   Alcohol use: No    Comment: rarely   Drug use: No   Sexual activity: Not on file  Other Topics Concern   Not on file  Social History Narrative   Not on file   Social Drivers of Health   Financial Resource Strain: Not on file  Food Insecurity: No Food Insecurity (07/23/2023)   Hunger Vital Sign    Worried About Running Out of Food in the Last Year: Never true    Ran Out of Food in the Last Year: Never true  Transportation Needs: No Transportation Needs (07/23/2023)   PRAPARE - Administrator, Civil Service (Medical): No    Lack of Transportation (Non-Medical): No  Physical Activity: Not on file  Stress: Not on file  Social Connections: Not on file    Family History  Problem Relation Age of Onset   Asthma Other    Diabetes Other    Arthritis Other    Cancer Mother    Cancer Brother    Anesthesia problems Neg Hx    Hypotension Neg Hx    Malignant hyperthermia Neg Hx    Pseudochol deficiency Neg Hx    Colon polyps Neg Hx    Colon cancer Neg Hx    Celiac disease Neg Hx    Pancreatic cancer Neg Hx    Stomach cancer Neg Hx    Ulcerative colitis Neg Hx    Crohn's disease Neg Hx     Outpatient Encounter Medications as of 10/23/2023  Medication Sig   ACCU-CHEK AVIVA PLUS test strip USE TO MONITOR GLUCOSE LEVELS TWICE A DAY E11.9   ACCU-CHEK SOFTCLIX LANCETS lancets Use to monitor glucose levels BID; E11.9 (Patient taking differently: 1 each by Other route  2 (two) times daily. E11.9)   acetaminophen (TYLENOL) 500 MG tablet Take 1 tablet (500 mg total) by mouth every 6 (six) hours as needed. (Patient taking differently: Take 500 mg by mouth every 6 (six) hours as needed (for pain).)   atorvastatin (LIPITOR) 20 MG tablet Take 20 mg by mouth daily.   Blood  Glucose Calibration (GLUCOSE CONTROL) SOLN 1 Bottle by In Vitro route as needed. Use to calibrate Accu-Chek Aviva Plus device prn; please provide control solution compatible with Accu-Chek Aviva Plus device. (Patient taking differently: 1 each by Other route as needed (Use to calibrate glucometer). E11.9)   Blood Glucose Monitoring Suppl (ACCU-CHEK AVIVA PLUS) w/Device KIT 1 each by Does not apply route 2 (two) times daily. Use to monitor glucose levels BID; E11.9 (Patient taking differently: 1 each by Does not apply route 2 (two) times daily. E11.9)   chlorthalidone (HYGROTON) 25 MG tablet Take 12.5 mg by mouth daily.    clobetasol cream (TEMOVATE) 0.05 % Apply 1 Application topically 2 (two) times daily as needed (itching).   cyclobenzaprine (FLEXERIL) 10 MG tablet TAKE 1 TABLET BY MOUTH TWICE A DAY AS NEEDED FOR MUSCLE SPASMS   diclofenac Sodium (VOLTAREN ARTHRITIS PAIN) 1 % GEL Apply 4 g topically 4 (four) times daily. (Patient taking differently: Apply 4 g topically 3 (three) times daily as needed (joint pain).)   escitalopram (LEXAPRO) 10 MG tablet Take 1 tablet (10 mg total) by mouth every morning.   folic acid (FOLVITE) 1 MG tablet Take 1 tablet (1 mg total) by mouth daily. Start 7 days before pemetrexed chemotherapy. Continue until 21 days after pemetrexed completed.   ibuprofen (ADVIL) 800 MG tablet Take 800 mg by mouth every 8 (eight) hours as needed for moderate pain.   Insulin Pen Needle (B-D ULTRAFINE III SHORT PEN) 31G X 8 MM MISC USE AS DIRECTED   ipratropium (ATROVENT) 0.03 % nasal spray Place 2 sprays into both nostrils 2 (two) times daily.   lidocaine-prilocaine (EMLA) cream Apply to  affected area once   lisinopril (ZESTRIL) 10 MG tablet Take 10 mg by mouth daily.   meloxicam (MOBIC) 7.5 MG tablet Take 7.5 mg by mouth daily.   memantine (NAMENDA) 10 MG tablet Take 1 tablet (10 mg total) by mouth 2 (two) times daily. Take through 01-20-24, then stop.   ondansetron (ZOFRAN) 8 MG tablet Take 1 tablet (8 mg total) by mouth every 8 (eight) hours as needed for nausea or vomiting. Start on the third day after carboplatin.   pantoprazole (PROTONIX) 40 MG tablet Take 1 tablet (40 mg total) by mouth 2 (two) times daily.   prochlorperazine (COMPAZINE) 10 MG tablet Take 1 tablet (10 mg total) by mouth every 6 (six) hours as needed for nausea or vomiting.   traZODone (DESYREL) 100 MG tablet Take 2 tablets (200 mg total) by mouth at bedtime.   triamcinolone cream (KENALOG) 0.1 % Apply 1 application topically 2 (two) times daily. (Patient taking differently: Apply 1 application  topically 2 (two) times daily as needed (irritation).)   APIXABAN (ELIQUIS) VTE STARTER PACK (10MG  AND 5MG ) Take as directed on package: start with two-5mg  tablets twice daily for 7 days. On day 8, switch to one-5mg  tablet twice daily. (Patient not taking: Reported on 10/23/2023)   HYDROcodone bit-homatropine (HYCODAN) 5-1.5 MG/5ML syrup Take 5 mLs by mouth every 6 (six) hours as needed for cough. (Patient not taking: Reported on 10/23/2023)   insulin glargine (LANTUS SOLOSTAR) 100 UNIT/ML Solostar Pen Inject 15 Units into the skin at bedtime. (Patient not taking: Reported on 10/23/2023)   [DISCONTINUED] metFORMIN (GLUCOPHAGE-XR) 500 MG 24 hr tablet Take 1 tablet (500 mg total) by mouth daily with breakfast. (Patient not taking: Reported on 10/23/2023)   [DISCONTINUED] tirzepatide (MOUNJARO) 15 MG/0.5ML Pen Inject 15 mg into the skin once a week. (Patient not taking:  Reported on 10/23/2023)   No facility-administered encounter medications on file as of 10/23/2023.    ALLERGIES: Allergies  Allergen Reactions   Synthroid  [Levothyroxine Sodium] Anaphylaxis   Hydrochlorothiazide     cramps   Iodine Rash    I get a skin rash sometimes if iodine applied to my skin.      VACCINATION STATUS: Immunization History  Administered Date(s) Administered   Influenza, High Dose Seasonal PF 04/19/2019, 05/08/2023   Moderna Sars-Covid-2 Vaccination 06/16/2020    Diabetes She presents for her follow-up diabetic visit. She has type 2 diabetes mellitus. Onset time: diagnosed at approx age of 48. Her disease course has been fluctuating. There are no hypoglycemic associated symptoms. There are no diabetic associated symptoms. There are no hypoglycemic complications. There are no diabetic complications. Risk factors for coronary artery disease include diabetes mellitus, obesity, hypertension and post-menopausal. Current diabetic treatment includes oral agent (monotherapy) and insulin injections (and Mounjaro). She is compliant with treatment all of the time. Her weight is decreasing steadily. She is following a generally healthy diet. Meal planning includes avoidance of concentrated sweets. She has not had a previous visit with a dietitian. She participates in exercise daily. Her overall blood glucose range is 140-180 mg/dl. (She presents today with her CGM showing slightly above target postprandial readings.  Her POCT A1c today is 7.8%, increasing from last visit of 6.6%.  Analysis of her CGM shows TIR 74%, TAR 26%, TBR 0% with a GMI of 7.1%.  She was diagnosed with cancer that metastasized to her brain, now on chemotherapy.  She stopped her insulin a couple of months ago due to drops in glucose.) An ACE inhibitor/angiotensin II receptor blocker is being taken. She sees a podiatrist.Eye exam is current.     Review of systems  Constitutional: + steadily decreasing body weight,  current Body mass index is 25.78 kg/m. , + fatigue, no subjective hyperthermia, no subjective hypothermia, + decreased appetite due to chemotherapy Eyes: no  blurry vision, no xerophthalmia ENT: no sore throat, no nodules palpated in throat, no dysphagia/odynophagia, no hoarseness Cardiovascular: no chest pain, no shortness of breath, no palpitations, no leg swelling Respiratory: no cough, no shortness of breath Gastrointestinal: no nausea/vomiting/diarrhea Skin: no rashes, no hyperemia Neurological: no tremors, no numbness, no tingling, no dizziness Psychiatric: no depression, no anxiety  Objective:     BP 92/74 (BP Location: Right Arm, Patient Position: Sitting, Cuff Size: Large)   Ht 5\' 7"  (1.702 m)   Wt 164 lb 9.6 oz (74.7 kg)   BMI 25.78 kg/m   Wt Readings from Last 3 Encounters:  10/23/23 164 lb 9.6 oz (74.7 kg)  10/16/23 162 lb 3.2 oz (73.6 kg)  10/16/23 162 lb 3.2 oz (73.6 kg)     BP Readings from Last 3 Encounters:  10/23/23 92/74  10/16/23 103/62  10/16/23 133/71     Physical Exam- Limited  Constitutional:  Body mass index is 25.78 kg/m. , not in acute distress, normal state of mind Eyes:  EOMI, no exophthalmos Musculoskeletal: no gross deformities, strength intact in all four extremities, no gross restriction of joint movements Skin:  no rashes, no hyperemia Neurological: no tremor with outstretched hands   Diabetic Foot Exam - Simple   Simple Foot Form Diabetic Foot exam was performed with the following findings: Yes 10/23/2023  8:14 AM  Visual Inspection No deformities, no ulcerations, no other skin breakdown bilaterally: Yes Sensation Testing Intact to touch and monofilament testing bilaterally: Yes Pulse Check Posterior Tibialis  and Dorsalis pulse intact bilaterally: Yes Comments     CMP ( most recent) CMP     Component Value Date/Time   NA 137 10/16/2023 1037   NA 142 10/29/2022 1158   NA 141 05/29/2017 0921   K 3.7 10/16/2023 1037   K 4.8 05/29/2017 0921   CL 99 10/16/2023 1037   CO2 27 10/16/2023 1037   CO2 28 05/29/2017 0921   GLUCOSE 193 (H) 10/16/2023 1037   GLUCOSE 184 (H) 05/29/2017  0921   BUN 35 (H) 10/16/2023 1037   BUN 21 10/29/2022 1158   BUN 13.5 05/29/2017 0921   CREATININE 1.08 (H) 10/16/2023 1037   CREATININE 0.8 05/29/2017 0921   CALCIUM 9.2 10/16/2023 1037   CALCIUM 9.5 05/29/2017 0921   PROT 6.6 10/16/2023 1037   PROT 6.2 10/29/2022 1158   PROT 7.1 05/29/2017 0921   ALBUMIN 3.8 10/16/2023 1037   ALBUMIN 4.3 10/29/2022 1158   ALBUMIN 80 mg/L 05/17/2022 0922   ALBUMIN 3.6 05/29/2017 0921   AST 23 10/16/2023 1037   AST 17 05/29/2017 0921   ALT 30 10/16/2023 1037   ALT 20 05/29/2017 0921   ALKPHOS 69 10/16/2023 1037   ALKPHOS 104 05/29/2017 0921   BILITOT 1.3 (H) 10/16/2023 1037   BILITOT 0.40 05/29/2017 0921   GFRNONAA 56 (L) 10/16/2023 1037   GFRAA >60 12/08/2019 0849     Diabetic Labs (most recent): Lab Results  Component Value Date   HGBA1C 7.8 (A) 10/23/2023   HGBA1C 6.6 06/25/2023   HGBA1C 6.6 (A) 02/21/2023     Lipid Panel ( most recent) Lipid Panel     Component Value Date/Time   CHOL 146 10/29/2022 1158   TRIG 113 10/29/2022 1158   HDL 62 10/29/2022 1158   CHOLHDL 2.4 10/29/2022 1158   LDLCALC 64 10/29/2022 1158   LABVLDL 20 10/29/2022 1158      Lab Results  Component Value Date   TSH 1.309 10/08/2023   TSH 2.489 10/02/2023   TSH 1.243 08/21/2023   TSH 0.871 10/29/2022   TSH 2.93 07/11/2016   FREET4 1.22 10/29/2022           Assessment & Plan:   1) Type 2 diabetes mellitus without complication, with long-term current use of insulin (HCC)  She presents today with her CGM showing slightly above target postprandial readings.  Her POCT A1c today is 7.8%, increasing from last visit of 6.6%.  Analysis of her CGM shows TIR 74%, TAR 26%, TBR 0% with a GMI of 7.1%.  She was diagnosed with cancer that metastasized to her brain, now on chemotherapy.  She stopped her insulin a couple of months ago due to drops in glucose.  - [name] has currently uncontrolled symptomatic type 2 DM since 68 years of age.    -Recent labs reviewed.  - I had a long discussion with her about the progressive nature of diabetes and the pathology behind its complications. -her diabetes is not currently complicated but she remains at a high risk for more acute and chronic complications which include CAD, CVA, CKD, retinopathy, and neuropathy. These are all discussed in detail with her.  The following Lifestyle Medicine recommendations according to American College of Lifestyle Medicine [facility]) were discussed and offered to patient and she agrees to start the journey:  A. Whole Foods, Plant-based plate comprising of fruits and vegetables, plant-based proteins, whole-grain carbohydrates was discussed in detail with the patient.   A list for source of those nutrients were  also provided to the patient.  Patient will use only water or unsweetened tea for hydration. B.  The need to stay away from risky substances including alcohol, smoking; obtaining 7 to 9 hours of restorative sleep, at least 150 minutes of moderate intensity exercise weekly, the importance of healthy social connections,  and stress reduction techniques were discussed. C.  A full color page of  Calorie density of various food groups per pound showing examples of each food groups was provided to the patient.  - Nutritional counseling repeated at each appointment due to patients tendency to fall back in to old habits.  - The patient admits there is a room for improvement in their diet and drink choices. -  Suggestion is made for the patient to avoid simple carbohydrates from their diet including Cakes, Sweet Desserts / Pastries, Ice Cream, Soda (diet and regular), Sweet Tea, Candies, Chips, Cookies, Sweet Pastries, Store Bought Juices, Alcohol in Excess of 1-2 drinks a day, Artificial Sweeteners, Coffee Creamer, and "Sugar-free" Products. This will help patient to have stable blood glucose profile and potentially avoid unintended weight gain.   - I encouraged the  patient to switch to unprocessed or minimally processed complex starch and increased protein intake (animal or plant source), fruits, and vegetables.   - Patient is advised to stick to a routine mealtimes to eat 3 meals a day and avoid unnecessary snacks (to snack only to correct hypoglycemia).  - I have approached her with the following individualized plan to manage her diabetes and patient agrees:   -She is advised to continue her Lantus 15 units SQ nightly if glucose is above 250 consistently and if she gets put on steroids (as these will likely raise her glucose).  She is advised to stop the [facility] and the Metformin for now due to decreased appetite coupled with her chemotherapy.  -she is encouraged to start monitoring glucose 4 times daily (using her CGM), before meals and before bed, and to call the clinic if she has readings less than 70 or above 300 for 3 tests in a row.  - she is warned not to take insulin without proper monitoring per orders. - Adjustment parameters are given to her for hypo and hyperglycemia in writing.  - her Parlodel and Marcelline Deist was discontinued previously, due to side effect profile and to deescalate her treatment plan.   - Specific targets for  A1c; LDL, HDL, and Triglycerides were discussed with the patient.  2) Blood Pressure /Hypertension:  her blood pressure is controlled to target.  She is advised to continue medications prescribed by her PCP.  3) Lipids/Hyperlipidemia:    Her recent lipid panel from 10/29/22 shows controlled LDL of 64.  she is advised to continue Lipitor 20 mg daily at bedtime.  Side effects and precautions discussed with her.   4)  Weight/Diet:  her Body mass index is 25.78 kg/m.  -  clearly complicating her diabetes care.   she is a candidate for weight loss. I discussed with her the fact that loss of 5 - 10% of her  current body weight will have the most impact on her diabetes management.  Exercise, and detailed carbohydrates  information provided  -  detailed on discharge instructions.  5)Vitamin D deficiency Her recent vitamin D level on 10/29/22 was 19.5.  I discussed and initiated Ergocalciferol 50000 units po weekly x 12 weeks, once finished she can switch over to OTC Vitamin D3 5000 units daily as maintenance dose.  6) Chronic Care/Health Maintenance: -she is on ACEI/ARB and Statin medications and is encouraged to initiate and continue to follow up with Ophthalmology, Dentist, Podiatrist at least yearly or according to recommendations, and advised to stay away from smoking. I have recommended yearly flu vaccine and pneumonia vaccine at least every 5 years; moderate intensity exercise for up to 150 minutes weekly; and sleep for at least 7 hours a day.  - she is advised to maintain close follow up with Darrow Bussing, MD for primary care needs, as well as her other providers for optimal and coordinated care.     I spent  41  minutes in the care of the patient today including review of labs from CMP, Lipids, Thyroid Function, Hematology (current and previous including abstractions from other facilities); face-to-face time discussing  her blood glucose readings/logs, discussing hypoglycemia and hyperglycemia episodes and symptoms, medications doses, her options of short and long term treatment based on the latest standards of care / guidelines;  discussion about incorporating lifestyle medicine;  and documenting the encounter. Risk reduction counseling performed per USPSTF guidelines to reduce obesity and cardiovascular risk factors.     Please refer to Patient Instructions for Blood Glucose Monitoring and Insulin/Medications Dosing Guide"  in media tab for additional information. Please  also refer to " Patient Self Inventory" in the Media  tab for reviewed elements of pertinent patient history.  Trudee Grip participated in the discussions, expressed understanding, and voiced agreement with the above plans.  All  questions were answered to her satisfaction. she is encouraged to contact clinic should she have any questions or concerns prior to her return visit.     Follow up plan: - Return in about 4 months (around 02/22/2024) for Diabetes F/U with A1c in office, No previsit labs, Bring meter and logs.  Ronny Bacon, [facility] [address] [place], [place] [address] Phone: [phone] Fax: [phone]  10/23/2023, 8:28 AM

## 2023-10-24 ENCOUNTER — Inpatient Hospital Stay

## 2023-10-24 ENCOUNTER — Ambulatory Visit: Payer: Medicare Other

## 2023-10-24 ENCOUNTER — Other Ambulatory Visit: Payer: Self-pay

## 2023-10-24 ENCOUNTER — Other Ambulatory Visit: Payer: Medicare Other

## 2023-10-24 ENCOUNTER — Ambulatory Visit: Payer: Medicare Other | Admitting: Internal Medicine

## 2023-10-24 DIAGNOSIS — C3491 Malignant neoplasm of unspecified part of right bronchus or lung: Secondary | ICD-10-CM

## 2023-10-24 DIAGNOSIS — Z5111 Encounter for antineoplastic chemotherapy: Secondary | ICD-10-CM | POA: Diagnosis not present

## 2023-10-24 DIAGNOSIS — Z95828 Presence of other vascular implants and grafts: Secondary | ICD-10-CM

## 2023-10-24 LAB — CMP (CANCER CENTER ONLY)
ALT: 25 U/L (ref 0–44)
AST: 23 U/L (ref 15–41)
Albumin: 3.6 g/dL (ref 3.5–5.0)
Alkaline Phosphatase: 67 U/L (ref 38–126)
Anion gap: 7 (ref 5–15)
BUN: 12 mg/dL (ref 8–23)
CO2: 30 mmol/L (ref 22–32)
Calcium: 8.9 mg/dL (ref 8.9–10.3)
Chloride: 100 mmol/L (ref 98–111)
Creatinine: 1.06 mg/dL — ABNORMAL HIGH (ref 0.44–1.00)
GFR, Estimated: 57 mL/min — ABNORMAL LOW (ref >60)
Glucose, Bld: 198 mg/dL — ABNORMAL HIGH (ref 70–99)
Potassium: 3.1 mmol/L — ABNORMAL LOW (ref 3.5–5.1)
Sodium: 137 mmol/L (ref 135–145)
Total Bilirubin: 0.5 mg/dL (ref 0.0–1.2)
Total Protein: 6.3 g/dL — ABNORMAL LOW (ref 6.5–8.1)

## 2023-10-24 LAB — CBC WITH DIFFERENTIAL (CANCER CENTER ONLY)
Abs Immature Granulocytes: 0.01 10*3/uL (ref 0.00–0.07)
Basophils Absolute: 0 10*3/uL (ref 0.0–0.1)
Basophils Relative: 0 %
Eosinophils Absolute: 0.1 10*3/uL (ref 0.0–0.5)
Eosinophils Relative: 3 %
HCT: 23.1 % — ABNORMAL LOW (ref 36.0–46.0)
Hemoglobin: 8 g/dL — ABNORMAL LOW (ref 12.0–15.0)
Immature Granulocytes: 1 %
Lymphocytes Relative: 45 %
Lymphs Abs: 1 10*3/uL (ref 0.7–4.0)
MCH: 25.4 pg — ABNORMAL LOW (ref 26.0–34.0)
MCHC: 34.6 g/dL (ref 30.0–36.0)
MCV: 73.3 fL — ABNORMAL LOW (ref 80.0–100.0)
Monocytes Absolute: 0.3 10*3/uL (ref 0.1–1.0)
Monocytes Relative: 15 %
Neutro Abs: 0.8 10*3/uL — ABNORMAL LOW (ref 1.7–7.7)
Neutrophils Relative %: 36 %
Platelet Count: 109 10*3/uL — ABNORMAL LOW (ref 150–400)
RBC: 3.15 MIL/uL — ABNORMAL LOW (ref 3.87–5.11)
RDW: 15.9 % — ABNORMAL HIGH (ref 11.5–15.5)
WBC Count: 2.1 10*3/uL — ABNORMAL LOW (ref 4.0–10.5)
nRBC: 0 % (ref 0.0–0.2)

## 2023-10-24 MED ORDER — SODIUM CHLORIDE 0.9% FLUSH
10.0000 mL | Freq: Once | INTRAVENOUS | Status: AC
  Administered 2023-10-24: 10 mL

## 2023-10-24 MED ORDER — HEPARIN SOD (PORK) LOCK FLUSH 100 UNIT/ML IV SOLN
500.0000 [IU] | Freq: Once | INTRAVENOUS | Status: AC
  Administered 2023-10-24: 500 [IU]

## 2023-10-25 ENCOUNTER — Telehealth (HOSPITAL_COMMUNITY): Admitting: Psychiatry

## 2023-10-25 ENCOUNTER — Telehealth: Payer: Self-pay

## 2023-10-25 ENCOUNTER — Encounter (HOSPITAL_COMMUNITY): Payer: Self-pay | Admitting: Psychiatry

## 2023-10-25 ENCOUNTER — Encounter: Payer: Self-pay | Admitting: General Practice

## 2023-10-25 DIAGNOSIS — F331 Major depressive disorder, recurrent, moderate: Secondary | ICD-10-CM

## 2023-10-25 MED ORDER — TRAZODONE HCL 100 MG PO TABS: 200.0000 mg | ORAL_TABLET | Freq: Every day | ORAL | 3 refills | Status: DC

## 2023-10-25 MED ORDER — ESCITALOPRAM OXALATE 10 MG PO TABS: 10.0000 mg | ORAL_TABLET | Freq: Every morning | ORAL | 3 refills | Status: DC

## 2023-10-25 NOTE — Progress Notes (Signed)
 Virtual Visit via Video Note  I connected with [name] on 10/25/23 at 10:40 AM EDT by a video enabled telemedicine application and verified that I am speaking with the correct person using two identifiers.  Location: Patient: home Provider: office   I discussed the limitations of evaluation and management by telemedicine and the availability of in person appointments. The patient expressed understanding and agreed to proceed.      I discussed the assessment and treatment plan with the patient. The patient was provided an opportunity to ask questions and all were answered. The patient agreed with the plan and demonstrated an understanding of the instructions.   The patient was advised to call back or seek an in-person evaluation if the symptoms worsen or if the condition fails to improve as anticipated.  I provided 20 minutes of non-face-to-face time during this encounter.   Diannia Ruder, MD  [facility] MD/PA/NP OP Progress Note  10/25/2023 10:56 AM [name]  MRN:  [id number]  Chief Complaint:  Chief Complaint  Patient presents with   Depression   Follow-up   HPI: This patient is a 68 year old single white female who lives alone in [place].  She is retired as a Psychologist, counselling.  She is originally from [facility]   The patient returns for follow-up after about a year regarding her depression and anxiety.  Unfortunately she developed metastatic adenocarcinoma.  A few years ago she had a lesion in her lung that was resected.  She has been followed up closely with scans ever since.  The one last year unfortunately indicated metastases.  She had to have radiation to the brain and now is undergoing chemotherapy.  She has lost a lot of weight and for a while could not eat.  She is down about 80 pounds from her starting weight.  The patient does state that she has a lot of support with her neighbors and friends.  She does not seem to be depressed and states that  she has not had any cognitive decline from the brain radiation.  She is rather weak and is using a walker to walk but she is very upbeat and talkative and seems to be in a positive place right now.  She has 1 more round of chemotherapy and then will stay on immunotherapy.  She is thinking about moving to [place] to be with close friends if she is facing an end-of-life situation.  For now she does feel that the medicine for depression and sleep is helping considerably. Visit Diagnosis:    ICD-10-CM   1. Major depressive disorder, recurrent episode, moderate (HCC)  F33.1       Past Psychiatric History: none  Past Medical History:  Past Medical History:  Diagnosis Date   Adenocarcinoma of right lung, stage 1 (HCC) 11/29/2016   Cancer (HCC)    Chronic pain    Depression    takes Lexapro daily   Diabetes mellitus    takes Metformin daily and has an insulin pump.Fasting blood sugar runs250   GERD (gastroesophageal reflux disease)    takes Pantoprazole daily   History of colon polyps    benign   Hyperlipidemia    takes Atorvastatin daily   Hypertension    takes Amlodipine and Lisinopril daily   Insomnia    takes Trazodone nightly   Nocturia    Thyroid disease    Urinary frequency    Urinary urgency     Past Surgical History:  Procedure Laterality Date  ABDOMINAL HYSTERECTOMY     ABDOMINAL SURGERY     BIOPSY  05/28/2019   Procedure: BIOPSY;  Surgeon: West Bali, MD;  [facility];  Service: Endoscopy;;  random colon/gastric/duodenum   BIOPSY  01/11/2023   Procedure: BIOPSY;  Surgeon: Lanelle Bal, DO;  [facility];  Service: Endoscopy;;   COLONOSCOPY  2005 MAC   TICs, IH   COLONOSCOPY N/A 03/08/2014   Procedure: COLONOSCOPY;  Surgeon: West Bali, MD;  [facility];  Service: Endoscopy;  Laterality: N/A;  1015   COLONOSCOPY WITH PROPOFOL N/A 05/28/2019   Procedure: COLONOSCOPY WITH PROPOFOL;  Surgeon: West Bali, MD;  [facility];  Service: Endoscopy;  Laterality: N/A;  9:15am   CYSTOSTOMY  11/22/2011   Procedure: CYSTOSTOMY SUPRAPUBIC;  Surgeon: Ky Barban, MD;  Location: [facility];  Service: Urology;  Laterality: N/A;   ESOPHAGOGASTRODUODENOSCOPY N/A 03/08/2014   Procedure: ESOPHAGOGASTRODUODENOSCOPY (EGD);  Surgeon: West Bali, MD;  [facility];  Service: Endoscopy;  Laterality: N/A;   ESOPHAGOGASTRODUODENOSCOPY     ESOPHAGOGASTRODUODENOSCOPY (EGD) WITH PROPOFOL N/A 05/28/2019   Procedure: ESOPHAGOGASTRODUODENOSCOPY (EGD) WITH PROPOFOL;  Surgeon: West Bali, MD;  [facility];  Service: Endoscopy;  Laterality: N/A;   ESOPHAGOGASTRODUODENOSCOPY (EGD) WITH PROPOFOL N/A 01/11/2023   Procedure: ESOPHAGOGASTRODUODENOSCOPY (EGD) WITH PROPOFOL;  Surgeon: Lanelle Bal, DO;  [facility];  Service: Endoscopy;  Laterality: N/A;  9:45 am, asa 3   GIVENS CAPSULE STUDY N/A 06/15/2019   Procedure: GIVENS CAPSULE STUDY;  Surgeon: West Bali, MD;  [facility];  Service: Endoscopy;  Laterality: N/A;  7:30am   HALLUX VALGUS CORRECTION     bilateral foot surgery for bone repairs-multiple   IR IMAGING GUIDED PORT INSERTION  08/19/2023   LOBECTOMY Right 10/15/2016   Procedure: RIGHT UPPER LOBECTOMY;  Surgeon: Loreli Slot, MD;  Location: [facility];  Service: Thoracic;  Laterality: Right;   LYMPH NODE DISSECTION Right 10/15/2016   Procedure: LYMPH NODE DISSECTION;  Surgeon: Loreli Slot, MD;  Location: [facility] OR;  Service: Thoracic;  Laterality: Right;   POLYPECTOMY  05/28/2019   Procedure: POLYPECTOMY;  Surgeon: West Bali, MD;  [facility];  Service: Endoscopy;;   SAVORY DILATION N/A 05/28/2019   Procedure: SAVORY DILATION;  Surgeon: West Bali, MD;  [facility];  Service: Endoscopy;  Laterality: N/A;  15/16/17   VAGINA RECONSTRUCTION SURGERY     tvt 2006- [place]   VIDEO ASSISTED THORACOSCOPY (VATS)/WEDGE  RESECTION Right 10/15/2016   Procedure: RIGHT VIDEO ASSISTED THORACOSCOPY (VATS)/WEDGE RESECTION;  Surgeon: Loreli Slot, MD;  Location: [facility];  Service: Thoracic;  Laterality: Right;    Family Psychiatric History: See below  Family History:  Family History  Problem Relation Age of Onset   Asthma Other    Diabetes Other    Arthritis Other    Cancer Mother    Cancer Brother    Anesthesia problems Neg Hx    Hypotension Neg Hx    Malignant hyperthermia Neg Hx    Pseudochol deficiency Neg Hx    Colon polyps Neg Hx    Colon cancer Neg Hx    Celiac disease Neg Hx    Pancreatic cancer Neg Hx    Stomach cancer Neg Hx    Ulcerative colitis Neg Hx    Crohn's disease Neg Hx     Social History:  Social History  Socioeconomic History   Marital status: Single    Spouse name: Not on file   Number of children: Not on file   Years of education: Not on file   Highest education level: Not on file  Occupational History   Not on file  Tobacco Use   Smoking status: Former    Current packs/day: 1.00    Average packs/day: 1 pack/day for 32.0 years (32.0 ttl pk-yrs)    Types: Cigarettes    Passive exposure: Never   Smokeless tobacco: Never   Tobacco comments:    quit smoking 20 yrs ago  Vaping Use   Vaping status: Never Used  Substance and Sexual Activity   Alcohol use: No    Comment: rarely   Drug use: No   Sexual activity: Not on file  Other Topics Concern   Not on file  Social History Narrative   Not on file   Social Drivers of Health   Financial Resource Strain: Not on file  Food Insecurity: No Food Insecurity (07/23/2023)   Hunger Vital Sign    Worried About Running Out of Food in the Last Year: Never true    Ran Out of Food in the Last Year: Never true  Transportation Needs: No Transportation Needs (07/23/2023)   PRAPARE - Administrator, Civil Service (Medical): No    Lack of Transportation (Non-Medical): No  Physical Activity: Not on file   Stress: Not on file  Social Connections: Not on file    Allergies:  Allergies  Allergen Reactions   Synthroid [Levothyroxine Sodium] Anaphylaxis   Hydrochlorothiazide     cramps   Iodine Rash    I get a skin rash sometimes if iodine applied to my skin.      Metabolic Disorder Labs: Lab Results  Component Value Date   HGBA1C 7.8 (A) 10/23/2023   MPG 200 10/11/2016   No results found for: "PROLACTIN" Lab Results  Component Value Date   CHOL 146 10/29/2022   TRIG 113 10/29/2022   HDL 62 10/29/2022   CHOLHDL 2.4 10/29/2022   LDLCALC 64 10/29/2022   Lab Results  Component Value Date   TSH 1.309 10/08/2023   TSH 2.489 10/02/2023    Therapeutic Level Labs: No results found for: "LITHIUM" No results found for: "VALPROATE" No results found for: "CBMZ"  Current Medications: Current Outpatient Medications  Medication Sig Dispense Refill   ACCU-CHEK AVIVA PLUS test strip USE TO MONITOR GLUCOSE LEVELS TWICE A DAY E11.9 100 strip 12   ACCU-CHEK SOFTCLIX LANCETS lancets Use to monitor glucose levels BID; E11.9 (Patient taking differently: 1 each by Other route 2 (two) times daily. E11.9) 100 each 12   acetaminophen (TYLENOL) 500 MG tablet Take 1 tablet (500 mg total) by mouth every 6 (six) hours as needed. (Patient taking differently: Take 500 mg by mouth every 6 (six) hours as needed (for pain).) 30 tablet 0   APIXABAN (ELIQUIS) VTE STARTER PACK (10MG  AND 5MG ) Take as directed on package: start with two-5mg  tablets twice daily for 7 days. On day 8, switch to one-5mg  tablet twice daily. (Patient not taking: Reported on 10/23/2023) 74 each 0   atorvastatin (LIPITOR) 20 MG tablet Take 20 mg by mouth daily.  11   Blood Glucose Calibration (GLUCOSE CONTROL) SOLN 1 Bottle by In Vitro route as needed. Use to calibrate Accu-Chek Aviva Plus device prn; please provide control solution compatible with Accu-Chek Aviva Plus device. (Patient taking differently: 1 each by Other route as needed  (  Use to calibrate glucometer). E11.9) 1 each 1   Blood Glucose Monitoring Suppl (ACCU-CHEK AVIVA PLUS) w/Device KIT 1 each by Does not apply route 2 (two) times daily. Use to monitor glucose levels BID; E11.9 (Patient taking differently: 1 each by Does not apply route 2 (two) times daily. E11.9) 1 kit 0   chlorthalidone (HYGROTON) 25 MG tablet Take 12.5 mg by mouth daily.      clobetasol cream (TEMOVATE) 0.05 % Apply 1 Application topically 2 (two) times daily as needed (itching).     cyclobenzaprine (FLEXERIL) 10 MG tablet TAKE 1 TABLET BY MOUTH TWICE A DAY AS NEEDED FOR MUSCLE SPASMS     diclofenac Sodium (VOLTAREN ARTHRITIS PAIN) 1 % GEL Apply 4 g topically 4 (four) times daily. (Patient taking differently: Apply 4 g topically 3 (three) times daily as needed (joint pain).) 150 g 0   escitalopram (LEXAPRO) 10 MG tablet Take 1 tablet (10 mg total) by mouth every morning. 90 tablet 3   folic acid (FOLVITE) 1 MG tablet Take 1 tablet (1 mg total) by mouth daily. Start 7 days before pemetrexed chemotherapy. Continue until 21 days after pemetrexed completed. 100 tablet 3   HYDROcodone bit-homatropine (HYCODAN) 5-1.5 MG/5ML syrup Take 5 mLs by mouth every 6 (six) hours as needed for cough. (Patient not taking: Reported on 10/23/2023) 120 mL 0   ibuprofen (ADVIL) 800 MG tablet Take 800 mg by mouth every 8 (eight) hours as needed for moderate pain.     insulin glargine (LANTUS SOLOSTAR) 100 UNIT/ML Solostar Pen Inject 15 Units into the skin at bedtime. (Patient not taking: Reported on 10/23/2023) 15 mL 3   Insulin Pen Needle (B-D ULTRAFINE III SHORT PEN) 31G X 8 MM MISC USE AS DIRECTED 100 each 5   ipratropium (ATROVENT) 0.03 % nasal spray Place 2 sprays into both nostrils 2 (two) times daily.     lidocaine-prilocaine (EMLA) cream Apply to affected area once 30 g 3   lisinopril (ZESTRIL) 10 MG tablet Take 10 mg by mouth daily.     meloxicam (MOBIC) 7.5 MG tablet Take 7.5 mg by mouth daily.     memantine  (NAMENDA) 10 MG tablet Take 1 tablet (10 mg total) by mouth 2 (two) times daily. Take through 01-20-24, then stop. 180 tablet 0   ondansetron (ZOFRAN) 8 MG tablet Take 1 tablet (8 mg total) by mouth every 8 (eight) hours as needed for nausea or vomiting. Start on the third day after carboplatin. 30 tablet 1   pantoprazole (PROTONIX) 40 MG tablet Take 1 tablet (40 mg total) by mouth 2 (two) times daily. 180 tablet 3   prochlorperazine (COMPAZINE) 10 MG tablet Take 1 tablet (10 mg total) by mouth every 6 (six) hours as needed for nausea or vomiting. 30 tablet 1   traZODone (DESYREL) 100 MG tablet Take 2 tablets (200 mg total) by mouth at bedtime. 180 tablet 3   triamcinolone cream (KENALOG) 0.1 % Apply 1 application topically 2 (two) times daily. (Patient taking differently: Apply 1 application  topically 2 (two) times daily as needed (irritation).) 30 g 0   No current facility-administered medications for this visit.     Musculoskeletal: Strength & Muscle Tone: decreased Gait & Station: unsteady Patient leans: N/A  Psychiatric Specialty Exam: Review of Systems  Constitutional:  Positive for appetite change and fatigue.  Neurological:  Positive for weakness.  All other systems reviewed and are negative.   There were no vitals taken for this visit.There is no  height or weight on file to calculate BMI.  General Appearance: Casual and Fairly Groomed  Eye Contact:  Good  Speech:  Clear and Coherent  Volume:  Normal  Mood:  Euthymic  Affect:  Congruent  Thought Process:  Goal Directed  Orientation:  Full (Time, Place, and Person)  Thought Content: WDL   Suicidal Thoughts:  No  Homicidal Thoughts:  No  Memory:  Immediate;   Good Recent;   Good Remote;   Fair  Judgement:  Good  Insight:  Good  Psychomotor Activity:  Decreased  Concentration:  Concentration: Good and Attention Span: Good  Recall:  Good  Fund of Knowledge: Good  Language: Good  Akathisia:  No  Handed:  Right  AIMS  (if indicated): not done  Assets:  Communication Skills Desire for Improvement Resilience Social Support  ADL's:  Intact  Cognition: WNL  Sleep:  Good   Screenings: PHQ2-9    Flowsheet Row CONSULT from 07/23/2023 in [facility] Radiation Oncology Video Visit from 03/12/2022 in [facility] at [place] Video Visit from 09/14/2021 in [facility] at [place] Video Visit from 04/21/2021 in [facility] Outpatient [facility]al [facility] at [place] Video Visit from 10/05/2020 in [facility]al [facility] at [facility] Total Score 0 0 0 0 0      Flowsheet Row IR FLUORO/SHUNT/FIST from 08/19/2023 in [facility] Admission (Discharged) from 01/11/2023 in [facility] Pre-Admission Testing 60 from 01/09/2023 in [facility]  C-SSRS RISK CATEGORY No Risk No Risk No Risk        Assessment and Plan: This patient is a 68 year old female with a history of depression and anxiety.  Despite her metastatic adenocarcinoma and recent treatment she seems to be in good spirits.  She would like to continue with her psychiatric medications.  She will continue Lexapro 10 mg daily for depression and trazodone 200 mg at bedtime for sleep.  She will return to see me in 3 months.  Collaboration of Care: Collaboration of Care: Notes are shared with oncology through the epic system  Patient/Guardian was advised Release of Information must be obtained prior to any record release in order to collaborate their care with an outside provider. Patient/Guardian was advised if they have not already done so to contact the registration department to sign all necessary forms in order for [place] to release information regarding their care.   Consent: Patient/Guardian gives verbal consent for treatment and assignment of benefits for services provided during this visit.  Patient/Guardian expressed understanding and agreed to proceed.    Diannia Ruder, MD 10/25/2023, 10:56 AM

## 2023-10-25 NOTE — Telephone Encounter (Signed)
 A PHASE III TRIAL OF STEREOTACTIC RADIOSURGERY COMPARED WITH HIPPOCAMPAL-AVOIDANT WHOLE BRAIN RADIOTHERAPY (HA-WBRT) PLUS MEMANTINE FOR 5 OR MORE BRAIN METASTASES  Called patient at Dr Colletta Maryland request to let her know that her labs (BUN/CrCl) have improved this week, and that she needs to restart her memantine at 10 mg BID. Patient verbalized understanding.   Patient had questions about her labs (low WBC, low hgb). Explained that Dr Arbutus Ped will address those prn. Reinforced teaching regarding precautions around blood thinners and fall precautions. Encouraged activity as tolerated with appropriate assistive devices, even if it is just walking around for 2-3 minutes inside the house. Patient verbalized understanding of all.  Next research visit is due after her 4 month MRI. Encouraged patient to call with questions or concerns prn.  Margret Chance Camree Wigington, RN, BSN, [facility]  Her  Hers Clinical Research Nurse [facility] Direct Dial [phone] 10/25/2023 9:47 AM

## 2023-10-25 NOTE — Progress Notes (Signed)
 [facility] Spiritual Care Note  Reached Ms Gerhart by phone per referral from thoracic navigator Megan Bouchard/RN. Ms Medero reports strong faith, which is helping her accept her clinical situation, even as she recognizes that her family and friends may be in a different emotional place. She reports working at [employer] from [employer], whom she appreciates very much, for helping increase her hydration and caloric intake.  Provided introduction to Spiritual Care support, invitation to Lung Cancer Support Group (in which Ms Kuba is very interested), reflective listening, and emotional support. We plan to follow up at her treatment on 10/31/2023 to meet in person. Adding her to Lung Group email list per her request.   Konrad Dolores, [facility] Pager [phone] Voicemail [phone]

## 2023-10-29 ENCOUNTER — Inpatient Hospital Stay: Payer: Medicare Other

## 2023-10-30 MED FILL — Fosaprepitant Dimeglumine For IV Infusion 150 MG (Base Eq): INTRAVENOUS | Qty: 5 | Status: AC

## 2023-10-31 ENCOUNTER — Inpatient Hospital Stay: Admitting: Nutrition

## 2023-10-31 ENCOUNTER — Encounter: Payer: Self-pay | Admitting: General Practice

## 2023-10-31 ENCOUNTER — Inpatient Hospital Stay: Admitting: Internal Medicine

## 2023-10-31 ENCOUNTER — Inpatient Hospital Stay: Attending: Internal Medicine

## 2023-10-31 ENCOUNTER — Other Ambulatory Visit: Payer: Self-pay

## 2023-10-31 ENCOUNTER — Inpatient Hospital Stay

## 2023-10-31 VITALS — BP 104/66 | HR 62 | Temp 101.5°F | Resp 17 | Wt 167.1 lb

## 2023-10-31 VITALS — Temp 98.9°F

## 2023-10-31 DIAGNOSIS — E119 Type 2 diabetes mellitus without complications: Secondary | ICD-10-CM | POA: Diagnosis not present

## 2023-10-31 DIAGNOSIS — Z7984 Long term (current) use of oral hypoglycemic drugs: Secondary | ICD-10-CM | POA: Insufficient documentation

## 2023-10-31 DIAGNOSIS — Z860100 Personal history of colon polyps, unspecified: Secondary | ICD-10-CM | POA: Diagnosis not present

## 2023-10-31 DIAGNOSIS — C7972 Secondary malignant neoplasm of left adrenal gland: Secondary | ICD-10-CM | POA: Diagnosis not present

## 2023-10-31 DIAGNOSIS — Z9641 Presence of insulin pump (external) (internal): Secondary | ICD-10-CM | POA: Diagnosis not present

## 2023-10-31 DIAGNOSIS — G4700 Insomnia, unspecified: Secondary | ICD-10-CM | POA: Diagnosis not present

## 2023-10-31 DIAGNOSIS — Z95828 Presence of other vascular implants and grafts: Secondary | ICD-10-CM

## 2023-10-31 DIAGNOSIS — T451X5A Adverse effect of antineoplastic and immunosuppressive drugs, initial encounter: Secondary | ICD-10-CM | POA: Insufficient documentation

## 2023-10-31 DIAGNOSIS — C3411 Malignant neoplasm of upper lobe, right bronchus or lung: Secondary | ICD-10-CM | POA: Diagnosis not present

## 2023-10-31 DIAGNOSIS — D6481 Anemia due to antineoplastic chemotherapy: Secondary | ICD-10-CM | POA: Insufficient documentation

## 2023-10-31 DIAGNOSIS — I2699 Other pulmonary embolism without acute cor pulmonale: Secondary | ICD-10-CM | POA: Insufficient documentation

## 2023-10-31 DIAGNOSIS — Z794 Long term (current) use of insulin: Secondary | ICD-10-CM | POA: Insufficient documentation

## 2023-10-31 DIAGNOSIS — I700 Atherosclerosis of aorta: Secondary | ICD-10-CM | POA: Diagnosis not present

## 2023-10-31 DIAGNOSIS — C7931 Secondary malignant neoplasm of brain: Secondary | ICD-10-CM | POA: Diagnosis not present

## 2023-10-31 DIAGNOSIS — C3491 Malignant neoplasm of unspecified part of right bronchus or lung: Secondary | ICD-10-CM

## 2023-10-31 DIAGNOSIS — M47816 Spondylosis without myelopathy or radiculopathy, lumbar region: Secondary | ICD-10-CM | POA: Diagnosis not present

## 2023-10-31 DIAGNOSIS — I10 Essential (primary) hypertension: Secondary | ICD-10-CM | POA: Insufficient documentation

## 2023-10-31 DIAGNOSIS — C7951 Secondary malignant neoplasm of bone: Secondary | ICD-10-CM | POA: Diagnosis not present

## 2023-10-31 DIAGNOSIS — E785 Hyperlipidemia, unspecified: Secondary | ICD-10-CM | POA: Insufficient documentation

## 2023-10-31 DIAGNOSIS — D649 Anemia, unspecified: Secondary | ICD-10-CM | POA: Insufficient documentation

## 2023-10-31 DIAGNOSIS — C7971 Secondary malignant neoplasm of right adrenal gland: Secondary | ICD-10-CM | POA: Insufficient documentation

## 2023-10-31 DIAGNOSIS — K219 Gastro-esophageal reflux disease without esophagitis: Secondary | ICD-10-CM | POA: Diagnosis not present

## 2023-10-31 DIAGNOSIS — Z791 Long term (current) use of non-steroidal anti-inflammatories (NSAID): Secondary | ICD-10-CM | POA: Insufficient documentation

## 2023-10-31 DIAGNOSIS — Z86711 Personal history of pulmonary embolism: Secondary | ICD-10-CM | POA: Insufficient documentation

## 2023-10-31 DIAGNOSIS — Z5111 Encounter for antineoplastic chemotherapy: Secondary | ICD-10-CM | POA: Diagnosis present

## 2023-10-31 DIAGNOSIS — D6959 Other secondary thrombocytopenia: Secondary | ICD-10-CM | POA: Insufficient documentation

## 2023-10-31 DIAGNOSIS — Z7901 Long term (current) use of anticoagulants: Secondary | ICD-10-CM | POA: Diagnosis not present

## 2023-10-31 DIAGNOSIS — Z860101 Personal history of adenomatous and serrated colon polyps: Secondary | ICD-10-CM | POA: Insufficient documentation

## 2023-10-31 DIAGNOSIS — Z79899 Other long term (current) drug therapy: Secondary | ICD-10-CM | POA: Insufficient documentation

## 2023-10-31 DIAGNOSIS — R55 Syncope and collapse: Secondary | ICD-10-CM | POA: Insufficient documentation

## 2023-10-31 LAB — CMP (CANCER CENTER ONLY)
ALT: 18 U/L (ref 0–44)
AST: 22 U/L (ref 15–41)
Albumin: 3.4 g/dL — ABNORMAL LOW (ref 3.5–5.0)
Alkaline Phosphatase: 65 U/L (ref 38–126)
Anion gap: 8 (ref 5–15)
BUN: 7 mg/dL — ABNORMAL LOW (ref 8–23)
CO2: 29 mmol/L (ref 22–32)
Calcium: 8.8 mg/dL — ABNORMAL LOW (ref 8.9–10.3)
Chloride: 98 mmol/L (ref 98–111)
Creatinine: 1.02 mg/dL — ABNORMAL HIGH (ref 0.44–1.00)
GFR, Estimated: 60 mL/min — ABNORMAL LOW (ref >60)
Glucose, Bld: 196 mg/dL — ABNORMAL HIGH (ref 70–99)
Potassium: 3.4 mmol/L — ABNORMAL LOW (ref 3.5–5.1)
Sodium: 135 mmol/L (ref 135–145)
Total Bilirubin: 0.4 mg/dL (ref 0.0–1.2)
Total Protein: 5.9 g/dL — ABNORMAL LOW (ref 6.5–8.1)

## 2023-10-31 LAB — CBC WITH DIFFERENTIAL (CANCER CENTER ONLY)
Abs Immature Granulocytes: 0.18 10*3/uL — ABNORMAL HIGH (ref 0.00–0.07)
Basophils Absolute: 0 10*3/uL (ref 0.0–0.1)
Basophils Relative: 0 %
Eosinophils Absolute: 0.1 10*3/uL (ref 0.0–0.5)
Eosinophils Relative: 2 %
HCT: 22.5 % — ABNORMAL LOW (ref 36.0–46.0)
Hemoglobin: 7.7 g/dL — ABNORMAL LOW (ref 12.0–15.0)
Immature Granulocytes: 4 %
Lymphocytes Relative: 23 %
Lymphs Abs: 1.1 10*3/uL (ref 0.7–4.0)
MCH: 26.6 pg (ref 26.0–34.0)
MCHC: 34.2 g/dL (ref 30.0–36.0)
MCV: 77.6 fL — ABNORMAL LOW (ref 80.0–100.0)
Monocytes Absolute: 0.9 10*3/uL (ref 0.1–1.0)
Monocytes Relative: 17 %
Neutro Abs: 2.7 10*3/uL (ref 1.7–7.7)
Neutrophils Relative %: 54 %
Platelet Count: 453 10*3/uL — ABNORMAL HIGH (ref 150–400)
RBC: 2.9 MIL/uL — ABNORMAL LOW (ref 3.87–5.11)
RDW: 18.7 % — ABNORMAL HIGH (ref 11.5–15.5)
WBC Count: 4.9 10*3/uL (ref 4.0–10.5)
nRBC: 0 % (ref 0.0–0.2)

## 2023-10-31 MED ORDER — SODIUM CHLORIDE 0.9 % IV SOLN
200.0000 mg | Freq: Once | INTRAVENOUS | Status: AC
  Administered 2023-10-31: 200 mg via INTRAVENOUS
  Filled 2023-10-31: qty 200

## 2023-10-31 MED ORDER — SODIUM CHLORIDE 0.9 % IV SOLN
440.0000 mg | Freq: Once | INTRAVENOUS | Status: AC
  Administered 2023-10-31: 440 mg via INTRAVENOUS
  Filled 2023-10-31: qty 44

## 2023-10-31 MED ORDER — SODIUM CHLORIDE 0.9% FLUSH
10.0000 mL | Freq: Once | INTRAVENOUS | Status: AC
  Administered 2023-10-31: 10 mL

## 2023-10-31 MED ORDER — PALONOSETRON HCL INJECTION 0.25 MG/5ML
0.2500 mg | Freq: Once | INTRAVENOUS | Status: AC
  Administered 2023-10-31: 0.25 mg via INTRAVENOUS
  Filled 2023-10-31: qty 5

## 2023-10-31 MED ORDER — DEXAMETHASONE SODIUM PHOSPHATE 10 MG/ML IJ SOLN
10.0000 mg | Freq: Once | INTRAMUSCULAR | Status: AC
  Administered 2023-10-31: 10 mg via INTRAVENOUS
  Filled 2023-10-31: qty 1

## 2023-10-31 MED ORDER — SODIUM CHLORIDE 0.9 % IV SOLN
500.0000 mg/m2 | Freq: Once | INTRAVENOUS | Status: AC
  Administered 2023-10-31: 1000 mg via INTRAVENOUS
  Filled 2023-10-31: qty 40

## 2023-10-31 MED ORDER — SODIUM CHLORIDE 0.9% FLUSH
10.0000 mL | INTRAVENOUS | Status: DC | PRN
  Administered 2023-10-31: 10 mL

## 2023-10-31 MED ORDER — SODIUM CHLORIDE 0.9 % IV SOLN
150.0000 mg | Freq: Once | INTRAVENOUS | Status: AC
  Administered 2023-10-31: 150 mg via INTRAVENOUS
  Filled 2023-10-31: qty 150

## 2023-10-31 MED ORDER — HEPARIN SOD (PORK) LOCK FLUSH 100 UNIT/ML IV SOLN
500.0000 [IU] | Freq: Once | INTRAVENOUS | Status: AC | PRN
  Administered 2023-10-31: 500 [IU]

## 2023-10-31 MED ORDER — SODIUM CHLORIDE 0.9 % IV SOLN: INTRAVENOUS | Status: DC

## 2023-10-31 NOTE — Progress Notes (Signed)
 Ok to [employer] dose to 440mg  today based on pt's wt=75.8kg and and Scr=1.02 per Dr. Arbutus Ped.  Drusilla Kanner, PharmD, MBA

## 2023-10-31 NOTE — Progress Notes (Signed)
 Orders for 1 unit of blood placed for [facility] tomorrow. Charge nurse notified.

## 2023-10-31 NOTE — Progress Notes (Signed)
 Nutrition follow-up completed with patient during infusion for recurrent lung cancer with metastases to bone and brain.  Weight improved and was documented as 167 pounds 1.6 ounces April 3.  Patient weighed 162 pounds 3.2 ounces March 19.  Labs include potassium 3.4, glucose 196, BUN 7, creatinine 1.02, hemoglobin 7.7.  Patient reports her appetite has improved.  She has been able to make changes in her food intake after she was educated by previous RD on strategies for taste alterations and poor appetite.  She expresses interest in learning more about high iron foods.  After meeting with her endocrinologist her metformin and Mounjaro were discontinued.  She states she is having issues with constipation but is managing with stool softeners and prune juice.  She is pleased with weight gain.  Nutrition diagnosis: Unintended weight loss, improved.  Intervention: Continue strategies for increasing calories and protein in small amounts throughout the day.   Consider a trial of nausea medication to improve intake associated with food smells. Continue baking soda and salt water gargle. Educated briefly on high iron foods.  Provided fact sheets on anemia and iron. Questions answered.  Contact information given.  Monitoring, evaluation, goals: Patient will tolerate adequate calories and protein to promote maintenance of lean body mass.  Next visit: Wednesday, April 23, during infusion.  **Disclaimer: This note was dictated with voice recognition software. Similar sounding words can inadvertently be transcribed and this note may contain transcription errors which may not have been corrected upon publication of note.**

## 2023-10-31 NOTE — Patient Instructions (Signed)
 [facility] - A DEPT OF [facility]  Discharge Instructions: Thank you for choosing [facility] to provide your oncology and hematology care.   If you have a lab appointment with the Cancer Center, please go directly to the Cancer Center and check in at the registration area.   Wear comfortable clothing and clothing appropriate for easy access to any Portacath or PICC line.   We strive to give you quality time with your provider. You may need to reschedule your appointment if you arrive late (15 or more minutes).  Arriving late affects you and other patients whose appointments are after yours.  Also, if you miss three or more appointments without notifying the office, you may be dismissed from the clinic at the provider's discretion.      For prescription refill requests, have your pharmacy contact our office and allow 72 hours for refills to be completed.    Today you received the following chemotherapy and/or immunotherapy agents: CARBOplatin (PARAPLATIN), PEMEtrexed Disodium (ALIMTA), and pembrolizumab (KEYTRUDA)      To help prevent nausea and vomiting after your treatment, we encourage you to take your nausea medication as directed.  BELOW ARE SYMPTOMS THAT SHOULD BE REPORTED IMMEDIATELY: *FEVER GREATER THAN 100.4 F (38 C) OR HIGHER *CHILLS OR SWEATING *NAUSEA AND VOMITING THAT IS NOT CONTROLLED WITH YOUR NAUSEA MEDICATION *UNUSUAL SHORTNESS OF BREATH *UNUSUAL BRUISING OR BLEEDING *URINARY PROBLEMS (pain or burning when urinating, or frequent urination) *BOWEL PROBLEMS (unusual diarrhea, constipation, pain near the anus) TENDERNESS IN MOUTH AND THROAT WITH OR WITHOUT PRESENCE OF ULCERS (sore throat, sores in mouth, or a toothache) UNUSUAL RASH, SWELLING OR PAIN  UNUSUAL VAGINAL DISCHARGE OR ITCHING   Items with * indicate a potential emergency and should be followed up as soon as possible or go to the Emergency Department if any  problems should occur.  Please show the CHEMOTHERAPY ALERT CARD or IMMUNOTHERAPY ALERT CARD at check-in to the Emergency Department and triage nurse.  Should you have questions after your visit or need to cancel or reschedule your appointment, please contact [facility] - A DEPT OF [facility]  Dept: [phone]  and follow the prompts.  Office hours are 8:00 a.m. to 4:30 p.m. Monday - Friday. Please note that voicemails left after 4:00 p.m. may not be returned until the following business day.  We are closed weekends and major holidays. You have access to a nurse at all times for urgent questions. Please call the main number to the clinic Dept: [phone] and follow the prompts.   For any non-urgent questions, you may also contact your provider using MyChart. We now offer e-Visits for anyone 39 and older to request care online for non-urgent symptoms. For details visit [email or web address].   Also download the MyChart app! Go to the app store, search "MyChart", open the app, select [facility], and log in with your MyChart username and password.

## 2023-10-31 NOTE — Progress Notes (Signed)
 [facility] Telephone:(336) [phone]   Fax:(336) [phone]  OFFICE PROGRESS NOTE  Darrow Bussing, MD [address] [place] [address]  DIAGNOSIS:  1) recurrent/metastatic non-small cell lung cancer, adenocarcinoma presented with metastatic adenopathy in the thorax as well as mediastinum, axillary lymph nodes and potential local recurrence at the right hilum along the inferior margin of the resection staple line with bilateral hypermetabolic adrenal metastasis and hypermetabolic bone metastasis to the T11 vertebral body as well as brain metastasis diagnosed in December 2024. 2) incidental bilaterally pulmonary embolism diagnosed on CT scan of the chest on October 22, 2023 2) Stage IA (T1a, N0, M0) non-small cell lung cancer, adenocarcinoma presented with right upper lobe pulmonary nodule   Detected Alteration(s) / Biomarker(s) Associated FDA-approved therapies Clinical Trial Availability % cfDNA or Amplification KRAS G12C approved by FDA Adagrasib, Sotorasib approved in other indication Adagrasib+cetuximab Yes 2.8%  TP53 E204* None Yes 2.0%   PRIOR THERAPY:  1) S/P right VATS with right upper lobectomy and mediastinal lymph node dissection under the care of Dr. Dorris Fetch on October 15, 2016. 2) whole brain irradiation under the care of of Dr. Basilio Cairo.  CURRENT THERAPY:  1) Systemic chemotherapy with carboplatin for AUC of 5, Alimta 500 Mg/M2 and Keytruda 200 Mg IV every 3 weeks.  First dose August 20, 2022.  Status post 3 cycles. 2) Eliquis starter kit with 10 mg p.o. twice daily for 7 days followed by 5 mg p.o. daily.  First dose started October 18, 2023.   INTERVAL HISTORY: [name] 68 y.o. female returns to the clinic today for annual follow-up visit.Discussed the use of AI scribe software for clinical note transcription with the patient, who gave verbal consent to proceed.  History of Present Illness   [name] is a 68 year old  female with metastatic non-small cell lung cancer who presents for follow-up and repeat CT scan evaluation.  She feels 'really good' and notes improvement in her ability and steadiness, indicating she feels she is 'back to where' she likes to be. The recent CT scan conducted approximately ten days ago revealed a small bilateral pulmonary embolus as an incidental finding. The scan also showed some lymph nodes in the abdomen that have slightly increased in size, but they are nonspecific and small. Her lungs appear good, and bone healing is noted.  No issues or bleeding problems with this medication are reported.  Her hemoglobin level is low at 7.7 g/dL. Her white blood cell count and platelet levels are reported as good.        MEDICAL HISTORY: Past Medical History:  Diagnosis Date   Adenocarcinoma of right lung, stage 1 (HCC) 11/29/2016   Cancer (HCC)    Chronic pain    Depression    takes Lexapro daily   Diabetes mellitus    takes Metformin daily and has an insulin pump.Fasting blood sugar runs250   GERD (gastroesophageal reflux disease)    takes Pantoprazole daily   History of colon polyps    benign   Hyperlipidemia    takes Atorvastatin daily   Hypertension    takes Amlodipine and Lisinopril daily   Insomnia    takes Trazodone nightly   Nocturia    Thyroid disease    Urinary frequency    Urinary urgency     ALLERGIES:  is allergic to synthroid [levothyroxine sodium], hydrochlorothiazide, and iodine.  MEDICATIONS:  Current Outpatient Medications  Medication Sig Dispense Refill  ACCU-CHEK AVIVA PLUS test strip USE TO MONITOR GLUCOSE LEVELS TWICE A DAY E11.9 100 strip 12   ACCU-CHEK SOFTCLIX LANCETS lancets Use to monitor glucose levels BID; E11.9 (Patient taking differently: 1 each by Other route 2 (two) times daily. E11.9) 100 each 12   acetaminophen (TYLENOL) 500 MG tablet Take 1 tablet (500 mg total) by mouth every 6 (six) hours as needed. (Patient taking differently:  Take 500 mg by mouth every 6 (six) hours as needed (for pain).) 30 tablet 0   APIXABAN (ELIQUIS) VTE STARTER PACK (10MG  AND 5MG ) Take as directed on package: start with two-5mg  tablets twice daily for 7 days. On day 8, switch to one-5mg  tablet twice daily. (Patient not taking: Reported on 10/23/2023) 74 each 0   atorvastatin (LIPITOR) 20 MG tablet Take 20 mg by mouth daily.  11   Blood Glucose Calibration (GLUCOSE CONTROL) SOLN 1 Bottle by In Vitro route as needed. Use to calibrate Accu-Chek Aviva Plus device prn; please provide control solution compatible with Accu-Chek Aviva Plus device. (Patient taking differently: 1 each by Other route as needed (Use to calibrate glucometer). E11.9) 1 each 1   Blood Glucose Monitoring Suppl (ACCU-CHEK AVIVA PLUS) w/Device KIT 1 each by Does not apply route 2 (two) times daily. Use to monitor glucose levels BID; E11.9 (Patient taking differently: 1 each by Does not apply route 2 (two) times daily. E11.9) 1 kit 0   chlorthalidone (HYGROTON) 25 MG tablet Take 12.5 mg by mouth daily.      clobetasol cream (TEMOVATE) 0.05 % Apply 1 Application topically 2 (two) times daily as needed (itching).     cyclobenzaprine (FLEXERIL) 10 MG tablet TAKE 1 TABLET BY MOUTH TWICE A DAY AS NEEDED FOR MUSCLE SPASMS     diclofenac Sodium (VOLTAREN ARTHRITIS PAIN) 1 % GEL Apply 4 g topically 4 (four) times daily. (Patient taking differently: Apply 4 g topically 3 (three) times daily as needed (joint pain).) 150 g 0   escitalopram (LEXAPRO) 10 MG tablet Take 1 tablet (10 mg total) by mouth every morning. 90 tablet 3   folic acid (FOLVITE) 1 MG tablet Take 1 tablet (1 mg total) by mouth daily. Start 7 days before pemetrexed chemotherapy. Continue until 21 days after pemetrexed completed. 100 tablet 3   HYDROcodone bit-homatropine (HYCODAN) 5-1.5 MG/5ML syrup Take 5 mLs by mouth every 6 (six) hours as needed for cough. (Patient not taking: Reported on 10/23/2023) 120 mL 0   ibuprofen (ADVIL) 800  MG tablet Take 800 mg by mouth every 8 (eight) hours as needed for moderate pain.     insulin glargine (LANTUS SOLOSTAR) 100 UNIT/ML Solostar Pen Inject 15 Units into the skin at bedtime. (Patient not taking: Reported on 10/23/2023) 15 mL 3   Insulin Pen Needle (B-D ULTRAFINE III SHORT PEN) 31G X 8 MM MISC USE AS DIRECTED 100 each 5   ipratropium (ATROVENT) 0.03 % nasal spray Place 2 sprays into both nostrils 2 (two) times daily.     lidocaine-prilocaine (EMLA) cream Apply to affected area once 30 g 3   lisinopril (ZESTRIL) 10 MG tablet Take 10 mg by mouth daily.     meloxicam (MOBIC) 7.5 MG tablet Take 7.5 mg by mouth daily.     memantine (NAMENDA) 10 MG tablet Take 1 tablet (10 mg total) by mouth 2 (two) times daily. Take through 01-20-24, then stop. 180 tablet 0   ondansetron (ZOFRAN) 8 MG tablet Take 1 tablet (8 mg total) by mouth every 8 (eight)  hours as needed for nausea or vomiting. Start on the third day after carboplatin. 30 tablet 1   pantoprazole (PROTONIX) 40 MG tablet Take 1 tablet (40 mg total) by mouth 2 (two) times daily. 180 tablet 3   prochlorperazine (COMPAZINE) 10 MG tablet Take 1 tablet (10 mg total) by mouth every 6 (six) hours as needed for nausea or vomiting. 30 tablet 1   traZODone (DESYREL) 100 MG tablet Take 2 tablets (200 mg total) by mouth at bedtime. 180 tablet 3   triamcinolone cream (KENALOG) 0.1 % Apply 1 application topically 2 (two) times daily. (Patient taking differently: Apply 1 application  topically 2 (two) times daily as needed (irritation).) 30 g 0   No current facility-administered medications for this visit.    SURGICAL HISTORY:  Past Surgical History:  Procedure Laterality Date   ABDOMINAL HYSTERECTOMY     ABDOMINAL SURGERY     BIOPSY  05/28/2019   Procedure: BIOPSY;  Surgeon: West Bali, MD;  [facility]: [facility];  Service: Endoscopy;;  random colon/gastric/duodenum   BIOPSY  01/11/2023   Procedure: BIOPSY;  Surgeon: Lanelle Bal, DO;   [facility]: [facility];  Service: Endoscopy;;   COLONOSCOPY  2005 MAC   TICs, IH   COLONOSCOPY N/A 03/08/2014   Procedure: COLONOSCOPY;  Surgeon: West Bali, MD;  [facility]: [facility];  Service: Endoscopy;  Laterality: N/A;  1015   COLONOSCOPY WITH PROPOFOL N/A 05/28/2019   Procedure: COLONOSCOPY WITH PROPOFOL;  Surgeon: West Bali, MD;  [facility];  Service: Endoscopy;  Laterality: N/A;  9:15am   CYSTOSTOMY  11/22/2011   Procedure: CYSTOSTOMY SUPRAPUBIC;  Surgeon: Ky Barban, MD;  Location: [facility];  Service: Urology;  Laterality: N/A;   ESOPHAGOGASTRODUODENOSCOPY N/A 03/08/2014   Procedure: ESOPHAGOGASTRODUODENOSCOPY (EGD);  Surgeon: West Bali, MD;  [facility];  Service: Endoscopy;  Laterality: N/A;   ESOPHAGOGASTRODUODENOSCOPY     ESOPHAGOGASTRODUODENOSCOPY (EGD) WITH PROPOFOL N/A 05/28/2019   Procedure: ESOPHAGOGASTRODUODENOSCOPY (EGD) WITH PROPOFOL;  Surgeon: West Bali, MD;  [facility];  Service: Endoscopy;  Laterality: N/A;   ESOPHAGOGASTRODUODENOSCOPY (EGD) WITH PROPOFOL N/A 01/11/2023   Procedure: ESOPHAGOGASTRODUODENOSCOPY (EGD) WITH PROPOFOL;  Surgeon: Lanelle Bal, DO;  [facility];  Service: Endoscopy;  Laterality: N/A;  9:45 am, asa 3   GIVENS CAPSULE STUDY N/A 06/15/2019   Procedure: GIVENS CAPSULE STUDY;  Surgeon: West Bali, MD;  [facility];  Service: Endoscopy;  Laterality: N/A;  7:30am   HALLUX VALGUS CORRECTION     bilateral foot surgery for bone repairs-multiple   IR IMAGING GUIDED PORT INSERTION  08/19/2023   LOBECTOMY Right 10/15/2016   Procedure: RIGHT UPPER LOBECTOMY;  Surgeon: Loreli Slot, MD;  Location: [facility];  Service: Thoracic;  Laterality: Right;   LYMPH NODE DISSECTION Right 10/15/2016   Procedure: LYMPH NODE DISSECTION;  Surgeon: Loreli Slot, MD;  Location: [facility] OR;  Service: Thoracic;  Laterality: Right;   POLYPECTOMY  05/28/2019   Procedure:  POLYPECTOMY;  Surgeon: West Bali, MD;  [facility];  Service: Endoscopy;;   SAVORY DILATION N/A 05/28/2019   Procedure: SAVORY DILATION;  Surgeon: West Bali, MD;  [facility];  Service: Endoscopy;  Laterality: N/A;  15/16/17   VAGINA RECONSTRUCTION SURGERY     tvt 2006- [place]   VIDEO ASSISTED THORACOSCOPY (VATS)/WEDGE RESECTION Right 10/15/2016   Procedure: RIGHT VIDEO ASSISTED THORACOSCOPY (VATS)/WEDGE RESECTION;  Surgeon:  Loreli Slot, MD;  Location: [facility] OR;  Service: Thoracic;  Laterality: Right;    REVIEW OF SYSTEMS:  Constitutional: positive for fatigue Eyes: negative Ears, nose, mouth, throat, and face: negative Respiratory: negative Cardiovascular: negative Gastrointestinal: negative Genitourinary:negative Integument/breast: negative Hematologic/lymphatic: negative Musculoskeletal:negative Neurological: negative Behavioral/Psych: negative Endocrine: negative Allergic/Immunologic: negative   PHYSICAL EXAMINATION: General appearance: alert, cooperative, fatigued, and no distress Head: Normocephalic, without obvious abnormality, atraumatic Neck: no adenopathy, no JVD, supple, symmetrical, trachea midline, and thyroid not enlarged, symmetric, no tenderness/mass/nodules Lymph nodes: Cervical, supraclavicular, and axillary nodes normal. Resp: clear to auscultation bilaterally Back: symmetric, no curvature. ROM normal. No CVA tenderness. Cardio: regular rate and rhythm, S1, S2 normal, no murmur, click, rub or gallop GI: soft, non-tender; bowel sounds normal; no masses,  no organomegaly Extremities: extremities normal, atraumatic, no cyanosis or edema Neurologic: Alert and oriented X 3, normal strength and tone. Normal symmetric reflexes. Normal coordination and gait  ECOG PERFORMANCE STATUS: 1 - Symptomatic but completely ambulatory  Blood pressure 104/66, pulse 62, temperature (!) 101.5 F (38.6 C), temperature source Temporal,  resp. rate 17, weight 167 lb 1.6 oz (75.8 kg), SpO2 100%.  LABORATORY DATA: Lab Results  Component Value Date   WBC 4.9 10/31/2023   HGB 7.7 (L) 10/31/2023   HCT 22.5 (L) 10/31/2023   MCV 77.6 (L) 10/31/2023   PLT 453 (H) 10/31/2023      Chemistry      Component Value Date/Time   NA 137 10/24/2023 1203   NA 142 10/29/2022 1158   NA 141 05/29/2017 0921   K 3.1 (L) 10/24/2023 1203   K 4.8 05/29/2017 0921   CL 100 10/24/2023 1203   CO2 30 10/24/2023 1203   CO2 28 05/29/2017 0921   BUN 12 10/24/2023 1203   BUN 21 10/29/2022 1158   BUN 13.5 05/29/2017 0921   CREATININE 1.06 (H) 10/24/2023 1203   CREATININE 0.8 05/29/2017 0921      Component Value Date/Time   CALCIUM 8.9 10/24/2023 1203   CALCIUM 9.5 05/29/2017 0921   ALKPHOS 67 10/24/2023 1203   ALKPHOS 104 05/29/2017 0921   AST 23 10/24/2023 1203   AST 17 05/29/2017 0921   ALT 25 10/24/2023 1203   ALT 20 05/29/2017 0921   BILITOT 0.5 10/24/2023 1203   BILITOT 0.40 05/29/2017 0921       RADIOGRAPHIC STUDIES: CT CHEST ABDOMEN PELVIS W CONTRAST Result Date: 10/22/2023 CLINICAL DATA:  Staging non-small-cell lung cancer. Prior right upper lobectomy. * Tracking Code: BO * EXAM: CT CHEST, ABDOMEN, AND PELVIS WITH CONTRAST TECHNIQUE: Multidetector CT imaging of the chest, abdomen and pelvis was performed following the standard protocol during bolus administration of intravenous contrast. RADIATION DOSE REDUCTION: This exam was performed according to the departmental dose-optimization program which includes automated exposure control, adjustment of the mA and/or kV according to patient size and/or use of iterative reconstruction technique. CONTRAST:  OMNIPAQUE IOHEXOL 300 MG/ML  SOLN COMPARISON:  CT chest dated 06/13/2023, nuclear medicine PET dated 07/16/2023 FINDINGS: CT CHEST FINDINGS Cardiovascular: Right chest wall port tip terminates at the right atrium. Normal heart size. No significant pericardial fluid/thickening.  Great vessels are normal in course and caliber. Bilateral filling defects within lower lobar proximal segmental pulmonary arteries (2:32, 31). Coronary artery calcifications. Mediastinum/Nodes: Imaged thyroid gland without nodules meeting criteria for imaging follow-up by size. Normal esophagus. Decreased size of multi station lymphadenopathy, for example 6 mm left supraclavicular (2:7), previously 17 mm, 13 mm right axillary (2:13), previously 22 mm,  and 11 mm subcarinal (2:28), previously 20 mm. Lungs/Pleura: The central airways are patent. Postsurgical changes of right upper lobectomy. Interval improved right hilar soft tissue thickening with decreased effacement of right middle lobe bronchi. Unchanged 2 mm left upper lobe perifissural nodule (4:35). No pneumothorax. No pleural effusion. Musculoskeletal: Increased sclerosis of left T11 metastasis measuring 1.7 x 1.1 cm (2:48), previously 1.3 x 0.9 cm. Multilevel degenerative changes of the thoracic spine. CT ABDOMEN PELVIS FINDINGS Hepatobiliary: No focal hepatic lesions. No intra or extrahepatic biliary ductal dilation. Normal gallbladder. Pancreas: No focal lesions or main ductal dilation. Spleen: Diffusely heterogeneous appearance of the splenic parenchyma with focal hypoattenuating foci measuring 12 mm (2:55) and 10 mm (2:60) which appear to correspond to subtle areas of hypermetabolism on prior PET. Adrenals/Urinary Tract: Decreased size of bilateral adrenal nodules measuring 9 mm on the right and 12 mm on the left (2:57), previously 13 mm and 18 mm, respectively. No suspicious renal mass or hydronephrosis. Punctate nonobstructing left interpolar stone. Irregular bladder stone. Stomach/Bowel: Normal appearance of the stomach. No evidence of bowel wall thickening, distention, or inflammatory changes. Moderately distended rectum contains large volume stool. Colonic diverticulosis without acute diverticulitis. 7 mm hyperattenuating rounded focus along the  anterolateral aspect of the ascending colon (2:81), most likely a diverticulum. Normal appendix. Vascular/Lymphatic: Aortic atherosclerosis. Slightly decreased size of left external iliac lymph node measuring 12 mm (2:99), previously 14 mm. Slightly increased prominence of a few subcentimeter retroperitoneal lymph nodes, for example 7 mm aortocaval (2:69), previously 5 mm and 6 mm left para-aortic (2:70), previously 4 mm, and 8 mm left common iliac lymph node (2:80), previously 5 mm. Reproductive: No adnexal masses. Other: No free fluid, fluid collection, or free air. Musculoskeletal: No acute or abnormal lytic or blastic osseous findings. Multilevel degenerative changes of the lumbar spine. Postsurgical changes of the anterior abdominal wall. IMPRESSION: 1. Bilateral filling defects within lower lobar proximal segmental pulmonary arteries, consistent with pulmonary emboli. No evidence of right heart strain. 2. Interval improved right hilar soft tissue thickening with decreased effacement of right middle lobe bronchi. 3. Decreased size of multi station lymphadenopathy and bilateral adrenal metastases. 4. Increased sclerosis of left T11 metastasis, likely treatment related changes. 5. Diffusely heterogeneous appearance of the splenic parenchyma with focal hypoattenuating foci measuring 12 mm and 10 mm which appear to correspond to subtle areas of hypermetabolism on prior PET, suspicious for metastases. 6. Slightly increased prominence of a few subcentimeter retroperitoneal lymph nodes, which are nonspecific and may be reactive. Attention on follow-up. 7. Moderately distended rectum contains large volume stool. Subcentimeter hyperattenuating focus along the ascending colon, most likely a colonic diverticulum. Attention on follow-up. 8. Aortic Atherosclerosis (ICD10-I70.0). Coronary artery calcifications. Assessment for potential risk factor modification, dietary therapy or pharmacologic therapy may be warranted, if  clinically indicated. Critical Value/emergent results were called by telephone at the time of interpretation on 10/22/2023 at 3:57 pm to provider [facility] , who verbally acknowledged these results. Electronically Signed   By: Agustin Cree M.D.   On: 10/22/2023 16:09   MR Brain W Wo Contrast Result Date: 10/17/2023 CLINICAL DATA:  68 year old female with metastatic non-small cell lung cancer. Status post radiation treatment in January. EXAM: MRI HEAD WITHOUT AND WITH CONTRAST TECHNIQUE: Multiplanar, multiecho pulse sequences of the brain and surrounding structures were obtained without and with intravenous contrast. CONTRAST:  8mL GADAVIST GADOBUTROL 1 MMOL/ML IV SOLN COMPARISON:  07/30/2023 treatment planning study. FINDINGS: Brain: Black blood postcontrast imaging series 1100. Innumerable enhancing brain  metastases as noted previously. The largest lesions at the bilateral superior convexities ranging from 10-13 mm (series 1100, images 221, 231, 238) have not significantly changed. Central bi-thalamic metastases redemonstrated. That on the right is slightly smaller now, 7 mm versus 9 mm previously. Contralateral left thalamic lesion has mildly enlarged, 8 mm and more rim enhancing now versus solid and 4-5 mm previously (series 1100, image 185). Left lateral temporal lobe dominant lesion also appears slightly larger and more confluent now, 11 mm on series 1100, image 151 versus 9-10 mm previously). Dominant cerebellar lesion located posteriorly in the left hemisphere is smaller now 7-8 mm on series 1100, image 131 versus 11 mm previously. No obvious brand new lesion. No pachymeningeal or leptomeningeal thickening or enhancement is identified. Associated DWI abnormality with the metastases consistent with hypercellularity. No overt hemorrhagic lesion. On axial FLAIR imaging now confluent T2 and FLAIR hyperintensity resembling cytotoxic edema has progressed in the bilateral superior frontal lobes (series 4 image  39). Similar edema in the left lateral temporal lobe and at the junction of the right posterior temporal and occipital lobes has only mildly progressed since December. And there is no significant cerebellar edema. No superimposed restricted diffusion suggestive of acute infarction. No midline shift, ventriculomegaly, extra-axial collection or acute intracranial hemorrhage. Cervicomedullary junction and pituitary are within normal limits. Vascular: Major intracranial vascular flow voids are preserved. Skull and upper cervical spine: Visualized bone marrow signal is within normal limits. Negative visible cervical spine, spinal cord. Sinuses/Orbits: Mild paranasal sinus mucosal thickening and/or fluid has increased. Orbits appear stable and negative. Other: Mild mastoid effusions have increased, greater on the left. Trace retained secretions in the nasopharynx. IMPRESSION: Innumerable brain metastases with mixed response to therapy compared to 07/30/2023 planning MRI: Most of the largest lesions are stable (individually up to 13 mm). Occasional metastases are slightly larger (left thalamus, left lateral temporal lobe). While other lesions are slightly smaller. No obvious brand new metastasis. But progressed and now confluent T2/FLAIR hyperintensity in the superior frontal lobes, most resembling vasogenic edema. But no significant intracranial mass effect. Electronically Signed   By: Odessa Fleming M.D.   On: 10/17/2023 09:18      ASSESSMENT AND PLAN: This is a very pleasant 68 years old white female with stage IV (T1a, N3, M1 C) non-small cell lung cancer initially diagnosed as stage IA non-small cell lung cancer status post right upper lobectomy with lymph node dissection.  This was diagnosed in March 2018 with evidence for disease recurrence and metastasis in December 2024. The patient has been on observation and she is feeling fine today with no concerning complaints. The patient had repeat CT scan of the chest  performed recently.  I personally independently reviewed the scan images and discussed the results with the patient today.  Unfortunately her scan showed interval development of several abnormal lymph nodes throughout the chest including right hilar, mediastinal, supraclavicular and bilateral axillary as well as upper abdominal lymphadenopathy.  There was also increasing right adrenal nodule worrisome for adrenal metastasis. She underwent ultrasound-guided core biopsy of right axillary lymph node and the final pathology was consistent with metastatic adenocarcinoma of lung primary.  We will send the tissue block to [facility] for molecular studies and PD-L1 expression.  Molecular studies by [id number] showed positive KRAS G12C mutation. She underwent whole brain radiation for brain metastasis. The patient is here today to start the first dose of systemic chemotherapy with carboplatin for AUC of 5, Alimta 500 Mg/M2 and Keytruda 200  Mg IV every 3 weeks.  First dose August 21, 2023.  Status post 3 cycles. She has been tolerating her treatment fairly well except for fatigue.    Metastatic non-small cell lung cancer   She is currently on Systemic chemotherapy with carboplatin for AUC of 5, Alimta 500 Mg/M2 and Keytruda 200 Mg IV every 3 weeks.  First dose August 20, 2022.  Status post 3 cycles. Recent CT scans of the chest, abdomen, and pelvis show the lung and bone are in good condition. Some lymph nodes in the abdomen are slightly increased in size but nonspecific due to their small size. Reports feeling good with improved steadiness and overall well-being.   - Continue current treatment regimen    Anemia   Hemoglobin level is low at 7.7 g/dL, indicating anemia. White blood cell count and platelets are within normal range. Discussed potential for arranging transfusion at a nearby hospital if not possible today.   - Arrange for blood transfusion if possible today or coordinate for a later date     Pulmonary embolism   Incidental finding of a small bilateral pulmonary embolus on recent CT scan. Currently on Eliquis 5 mg twice a day with no reported issues or bleeding problems.   - Continue Eliquis 5 mg twice a day   The patient was advised to call immediately if she has any concerning symptoms in the interval. The patient voices understanding of current disease status and treatment options and is in agreement with the current care plan. All questions were answered. The patient knows to call the clinic with any problems, questions or concerns. We can certainly see the patient much sooner if necessary.  Disclaimer: This note was dictated with voice recognition software. Similar sounding words can inadvertently be transcribed and may not be corrected upon review.

## 2023-10-31 NOTE — Progress Notes (Signed)
 [facility] Spiritual Care Note  Followed up with [name] in infusion to meet in person. Brought a full packet of [facility] support programming information, including chaplain's card/brochure and invitation to join Lung Cancer Support Group. [name] was appreciative and verbalized interest in group for positive support and connecting encouragement.   We plan to check in at a future treatment, and she knows to reach out to chaplain whenever needed/desired.   [address] Rush [place], [place], [facility] Pager [phone] Voicemail [phone]

## 2023-11-01 ENCOUNTER — Inpatient Hospital Stay

## 2023-11-01 DIAGNOSIS — Z5111 Encounter for antineoplastic chemotherapy: Secondary | ICD-10-CM | POA: Diagnosis not present

## 2023-11-01 DIAGNOSIS — Z95828 Presence of other vascular implants and grafts: Secondary | ICD-10-CM

## 2023-11-01 DIAGNOSIS — D649 Anemia, unspecified: Secondary | ICD-10-CM

## 2023-11-01 LAB — PREPARE RBC (CROSSMATCH)

## 2023-11-01 MED ORDER — DIPHENHYDRAMINE HCL 25 MG PO CAPS
25.0000 mg | ORAL_CAPSULE | Freq: Once | ORAL | Status: AC
  Administered 2023-11-01: 25 mg via ORAL
  Filled 2023-11-01: qty 1

## 2023-11-01 MED ORDER — SODIUM CHLORIDE 0.9% IV SOLUTION
250.0000 mL | INTRAVENOUS | Status: DC
  Administered 2023-11-01: 100 mL via INTRAVENOUS

## 2023-11-01 MED ORDER — ACETAMINOPHEN 325 MG PO TABS
650.0000 mg | ORAL_TABLET | Freq: Once | ORAL | Status: AC
  Administered 2023-11-01: 650 mg via ORAL
  Filled 2023-11-01: qty 2

## 2023-11-01 MED ORDER — HEPARIN SOD (PORK) LOCK FLUSH 100 UNIT/ML IV SOLN
500.0000 [IU] | Freq: Once | INTRAVENOUS | Status: AC
  Administered 2023-11-01: 500 [IU] via INTRAVENOUS

## 2023-11-01 MED ORDER — SODIUM CHLORIDE 0.9% FLUSH
10.0000 mL | INTRAVENOUS | Status: DC | PRN
  Administered 2023-11-01: 10 mL via INTRAVENOUS

## 2023-11-01 NOTE — Progress Notes (Signed)
 Patients port flushed without difficulty.  Good blood return noted with no bruising or swelling noted at site.  Patient remains accessed for treatment.

## 2023-11-01 NOTE — Progress Notes (Signed)
 Pt presents to cancer center for blood transfusion. Pt tolerated RBC transfusion well with no signs of complications. Blood transfusion given per protocol. Vitals stable pre, during, and post transfusion. RN educated pt on the importance of notifying the clinic if any complications occur and when to seek emergency care. Pt verbalized understanding and all questions answered at this time. Pt stable at discharge. All follow ups as scheduled.   [name] [employer]

## 2023-11-02 LAB — BPAM RBC
Blood Product Expiration Date: 202504072359
ISSUE DATE / TIME: 202504041017
Unit Type and Rh: 600

## 2023-11-02 LAB — TYPE AND SCREEN
ABO/RH(D): A NEG
Antibody Screen: NEGATIVE
Unit division: 0

## 2023-11-05 ENCOUNTER — Inpatient Hospital Stay: Payer: Medicare Other

## 2023-11-06 ENCOUNTER — Inpatient Hospital Stay

## 2023-11-06 DIAGNOSIS — C3491 Malignant neoplasm of unspecified part of right bronchus or lung: Secondary | ICD-10-CM

## 2023-11-06 DIAGNOSIS — Z5111 Encounter for antineoplastic chemotherapy: Secondary | ICD-10-CM | POA: Diagnosis not present

## 2023-11-06 DIAGNOSIS — Z95828 Presence of other vascular implants and grafts: Secondary | ICD-10-CM

## 2023-11-06 LAB — CBC WITH DIFFERENTIAL (CANCER CENTER ONLY)
Abs Immature Granulocytes: 0.02 10*3/uL (ref 0.00–0.07)
Basophils Absolute: 0 10*3/uL (ref 0.0–0.1)
Basophils Relative: 0 %
Eosinophils Absolute: 0 10*3/uL (ref 0.0–0.5)
Eosinophils Relative: 0 %
HCT: 23.3 % — ABNORMAL LOW (ref 36.0–46.0)
Hemoglobin: 8.2 g/dL — ABNORMAL LOW (ref 12.0–15.0)
Immature Granulocytes: 1 %
Lymphocytes Relative: 20 %
Lymphs Abs: 0.6 10*3/uL — ABNORMAL LOW (ref 0.7–4.0)
MCH: 27.7 pg (ref 26.0–34.0)
MCHC: 35.2 g/dL (ref 30.0–36.0)
MCV: 78.7 fL — ABNORMAL LOW (ref 80.0–100.0)
Monocytes Absolute: 0.1 10*3/uL (ref 0.1–1.0)
Monocytes Relative: 3 %
Neutro Abs: 2 10*3/uL (ref 1.7–7.7)
Neutrophils Relative %: 76 %
Platelet Count: 181 10*3/uL (ref 150–400)
RBC: 2.96 MIL/uL — ABNORMAL LOW (ref 3.87–5.11)
RDW: 19.6 % — ABNORMAL HIGH (ref 11.5–15.5)
WBC Count: 2.7 10*3/uL — ABNORMAL LOW (ref 4.0–10.5)
nRBC: 0 % (ref 0.0–0.2)

## 2023-11-06 LAB — CMP (CANCER CENTER ONLY)
ALT: 18 U/L (ref 0–44)
AST: 26 U/L (ref 15–41)
Albumin: 3.5 g/dL (ref 3.5–5.0)
Alkaline Phosphatase: 66 U/L (ref 38–126)
Anion gap: 9 (ref 5–15)
BUN: 18 mg/dL (ref 8–23)
CO2: 29 mmol/L (ref 22–32)
Calcium: 8.9 mg/dL (ref 8.9–10.3)
Chloride: 97 mmol/L — ABNORMAL LOW (ref 98–111)
Creatinine: 1.09 mg/dL — ABNORMAL HIGH (ref 0.44–1.00)
GFR, Estimated: 55 mL/min — ABNORMAL LOW (ref >60)
Glucose, Bld: 174 mg/dL — ABNORMAL HIGH (ref 70–99)
Potassium: 4.1 mmol/L (ref 3.5–5.1)
Sodium: 135 mmol/L (ref 135–145)
Total Bilirubin: 0.8 mg/dL (ref 0.0–1.2)
Total Protein: 6.4 g/dL — ABNORMAL LOW (ref 6.5–8.1)

## 2023-11-06 MED ORDER — SODIUM CHLORIDE 0.9% FLUSH
10.0000 mL | Freq: Once | INTRAVENOUS | Status: AC
  Administered 2023-11-06: 10 mL

## 2023-11-06 MED ORDER — HEPARIN SOD (PORK) LOCK FLUSH 100 UNIT/ML IV SOLN
500.0000 [IU] | Freq: Once | INTRAVENOUS | Status: AC
Start: 2023-11-06 — End: 2023-11-06
  Administered 2023-11-06: 500 [IU]

## 2023-11-12 ENCOUNTER — Encounter (HOSPITAL_COMMUNITY): Payer: Self-pay

## 2023-11-12 ENCOUNTER — Other Ambulatory Visit: Payer: Self-pay

## 2023-11-12 ENCOUNTER — Emergency Department (HOSPITAL_COMMUNITY)

## 2023-11-12 ENCOUNTER — Telehealth: Payer: Self-pay

## 2023-11-12 ENCOUNTER — Observation Stay (HOSPITAL_COMMUNITY)
Admission: EM | Admit: 2023-11-12 | Discharge: 2023-11-14 | Disposition: A | Discharge status: Home / Self Care | Attending: Family Medicine | Admitting: Family Medicine

## 2023-11-12 DIAGNOSIS — Z794 Long term (current) use of insulin: Secondary | ICD-10-CM | POA: Insufficient documentation

## 2023-11-12 DIAGNOSIS — Z79899 Other long term (current) drug therapy: Secondary | ICD-10-CM | POA: Insufficient documentation

## 2023-11-12 DIAGNOSIS — D61818 Other pancytopenia: Principal | ICD-10-CM | POA: Diagnosis present

## 2023-11-12 DIAGNOSIS — Z7901 Long term (current) use of anticoagulants: Secondary | ICD-10-CM | POA: Insufficient documentation

## 2023-11-12 DIAGNOSIS — W19XXXA Unspecified fall, initial encounter: Secondary | ICD-10-CM | POA: Diagnosis not present

## 2023-11-12 DIAGNOSIS — Z85118 Personal history of other malignant neoplasm of bronchus and lung: Secondary | ICD-10-CM | POA: Diagnosis not present

## 2023-11-12 DIAGNOSIS — I10 Essential (primary) hypertension: Secondary | ICD-10-CM | POA: Diagnosis present

## 2023-11-12 DIAGNOSIS — M545 Low back pain, unspecified: Secondary | ICD-10-CM | POA: Diagnosis present

## 2023-11-12 DIAGNOSIS — E1165 Type 2 diabetes mellitus with hyperglycemia: Secondary | ICD-10-CM | POA: Diagnosis present

## 2023-11-12 DIAGNOSIS — E782 Mixed hyperlipidemia: Secondary | ICD-10-CM | POA: Diagnosis present

## 2023-11-12 DIAGNOSIS — S32009A Unspecified fracture of unspecified lumbar vertebra, initial encounter for closed fracture: Secondary | ICD-10-CM | POA: Insufficient documentation

## 2023-11-12 DIAGNOSIS — R55 Syncope and collapse: Principal | ICD-10-CM | POA: Diagnosis present

## 2023-11-12 DIAGNOSIS — Z87891 Personal history of nicotine dependence: Secondary | ICD-10-CM | POA: Insufficient documentation

## 2023-11-12 DIAGNOSIS — D696 Thrombocytopenia, unspecified: Secondary | ICD-10-CM | POA: Diagnosis present

## 2023-11-12 DIAGNOSIS — C3491 Malignant neoplasm of unspecified part of right bronchus or lung: Secondary | ICD-10-CM | POA: Diagnosis present

## 2023-11-12 DIAGNOSIS — K219 Gastro-esophageal reflux disease without esophagitis: Secondary | ICD-10-CM | POA: Diagnosis present

## 2023-11-12 LAB — COMPREHENSIVE METABOLIC PANEL WITH GFR
ALT: 16 U/L (ref 0–44)
AST: 29 U/L (ref 15–41)
Albumin: 3.4 g/dL — ABNORMAL LOW (ref 3.5–5.0)
Alkaline Phosphatase: 67 U/L (ref 38–126)
Anion gap: 11 (ref 5–15)
BUN: 15 mg/dL (ref 8–23)
CO2: 24 mmol/L (ref 22–32)
Calcium: 8.8 mg/dL — ABNORMAL LOW (ref 8.9–10.3)
Chloride: 100 mmol/L (ref 98–111)
Creatinine, Ser: 1.14 mg/dL — ABNORMAL HIGH (ref 0.44–1.00)
GFR, Estimated: 52 mL/min — ABNORMAL LOW (ref >60)
Glucose, Bld: 110 mg/dL — ABNORMAL HIGH (ref 70–99)
Potassium: 3.5 mmol/L (ref 3.5–5.1)
Sodium: 135 mmol/L (ref 135–145)
Total Bilirubin: 0.4 mg/dL (ref 0.0–1.2)
Total Protein: 6.6 g/dL (ref 6.5–8.1)

## 2023-11-12 LAB — CBC WITH DIFFERENTIAL/PLATELET
Abs Immature Granulocytes: 0 10*3/uL (ref 0.00–0.07)
Basophils Absolute: 0 10*3/uL (ref 0.0–0.1)
Basophils Relative: 0 %
Eosinophils Absolute: 0 10*3/uL (ref 0.0–0.5)
Eosinophils Relative: 1 %
HCT: 21.1 % — ABNORMAL LOW (ref 36.0–46.0)
Hemoglobin: 7.1 g/dL — ABNORMAL LOW (ref 12.0–15.0)
Lymphocytes Relative: 68 %
Lymphs Abs: 1.4 10*3/uL (ref 0.7–4.0)
MCH: 27.3 pg (ref 26.0–34.0)
MCHC: 33.6 g/dL (ref 30.0–36.0)
MCV: 81.2 fL (ref 80.0–100.0)
Monocytes Absolute: 0.1 10*3/uL (ref 0.1–1.0)
Monocytes Relative: 4 %
Neutro Abs: 0.6 10*3/uL — ABNORMAL LOW (ref 1.7–7.7)
Neutrophils Relative %: 27 %
Platelets: 26 10*3/uL — CL (ref 150–400)
RBC: 2.6 MIL/uL — ABNORMAL LOW (ref 3.87–5.11)
RDW: 18.1 % — ABNORMAL HIGH (ref 11.5–15.5)
Smear Review: DECREASED
WBC: 2.1 10*3/uL — ABNORMAL LOW (ref 4.0–10.5)
nRBC: 0 % (ref 0.0–0.2)

## 2023-11-12 LAB — URINALYSIS, ROUTINE W REFLEX MICROSCOPIC
Bilirubin Urine: NEGATIVE
Glucose, UA: NEGATIVE mg/dL
Hgb urine dipstick: NEGATIVE
Ketones, ur: NEGATIVE mg/dL
Nitrite: NEGATIVE
Protein, ur: 30 mg/dL — AB
Specific Gravity, Urine: 1.016 (ref 1.005–1.030)
pH: 5 (ref 5.0–8.0)

## 2023-11-12 LAB — TROPONIN I (HIGH SENSITIVITY): Troponin I (High Sensitivity): 15 ng/L (ref <18)

## 2023-11-12 MED ORDER — ACETAMINOPHEN 500 MG PO TABS
1000.0000 mg | ORAL_TABLET | Freq: Once | ORAL | Status: AC
  Administered 2023-11-12: 1000 mg via ORAL
  Filled 2023-11-12: qty 2

## 2023-11-12 NOTE — ED Triage Notes (Signed)
 Pt c/o rt lower back pain with intermittent shooting pains. Pt states she hit the cabinet but not really a fall just slid down to the ground. No bruising noted to site. Pt is on chemotherapy for lung and brain cancer.

## 2023-11-12 NOTE — Telephone Encounter (Signed)
 A PHASE III TRIAL OF STEREOTACTIC RADIOSURGERY COMPARED WITH HIPPOCAMPAL-AVOIDANT WHOLE BRAIN RADIOTHERAPY (HA-WBRT) PLUS MEMANTINE FOR 5 OR MORE BRAIN METASTASES  Patient called research office. She was standing in her kitchen this am having some "unsteadiness" then found herself on the floor. She has had some "bad steps" lately and states she usually has people at home helping her but is alone this week. She has a lab appt tomorrow and is seeing Dr Marguerita Shih next week. I passed this information on to Dr Marguerita Shih and his RN in case he needs to add anything to her labs. I will be out of the office until 11/29/23 and asked that they reach out to the patient if they need to get her a message.  Victory Gravel Arica Bevilacqua, RN, BSN, [facility] She  [employer]  [employer] Clinical Research Nurse [facility] Direct Dial [phone] 11/12/2023 3:53 PM

## 2023-11-13 ENCOUNTER — Encounter: Payer: Self-pay | Admitting: *Deleted

## 2023-11-13 ENCOUNTER — Inpatient Hospital Stay

## 2023-11-13 ENCOUNTER — Other Ambulatory Visit (HOSPITAL_COMMUNITY): Payer: Self-pay | Admitting: *Deleted

## 2023-11-13 ENCOUNTER — Other Ambulatory Visit: Payer: Medicare Other

## 2023-11-13 ENCOUNTER — Ambulatory Visit: Payer: Medicare Other | Admitting: Internal Medicine

## 2023-11-13 ENCOUNTER — Observation Stay (HOSPITAL_COMMUNITY)

## 2023-11-13 ENCOUNTER — Ambulatory Visit: Payer: Medicare Other

## 2023-11-13 ENCOUNTER — Observation Stay (HOSPITAL_BASED_OUTPATIENT_CLINIC_OR_DEPARTMENT_OTHER)

## 2023-11-13 DIAGNOSIS — I10 Essential (primary) hypertension: Secondary | ICD-10-CM | POA: Diagnosis not present

## 2023-11-13 DIAGNOSIS — K219 Gastro-esophageal reflux disease without esophagitis: Secondary | ICD-10-CM

## 2023-11-13 DIAGNOSIS — D61818 Other pancytopenia: Secondary | ICD-10-CM | POA: Diagnosis not present

## 2023-11-13 DIAGNOSIS — C3491 Malignant neoplasm of unspecified part of right bronchus or lung: Secondary | ICD-10-CM

## 2023-11-13 DIAGNOSIS — R55 Syncope and collapse: Secondary | ICD-10-CM | POA: Diagnosis not present

## 2023-11-13 DIAGNOSIS — E782 Mixed hyperlipidemia: Secondary | ICD-10-CM | POA: Diagnosis not present

## 2023-11-13 DIAGNOSIS — D696 Thrombocytopenia, unspecified: Secondary | ICD-10-CM | POA: Diagnosis present

## 2023-11-13 DIAGNOSIS — E1165 Type 2 diabetes mellitus with hyperglycemia: Secondary | ICD-10-CM | POA: Diagnosis present

## 2023-11-13 LAB — GLUCOSE, CAPILLARY
Glucose-Capillary: 102 mg/dL — ABNORMAL HIGH (ref 70–99)
Glucose-Capillary: 205 mg/dL — ABNORMAL HIGH (ref 70–99)

## 2023-11-13 LAB — HEMOGLOBIN AND HEMATOCRIT, BLOOD
HCT: 18.7 % — ABNORMAL LOW (ref 36.0–46.0)
Hemoglobin: 6.4 g/dL — CL (ref 12.0–15.0)

## 2023-11-13 LAB — CBC
HCT: 16.8 % — ABNORMAL LOW (ref 36.0–46.0)
Hemoglobin: 5.8 g/dL — CL (ref 12.0–15.0)
MCH: 27.9 pg (ref 26.0–34.0)
MCHC: 34.5 g/dL (ref 30.0–36.0)
MCV: 80.8 fL (ref 80.0–100.0)
Platelets: 30 10*3/uL — ABNORMAL LOW (ref 150–400)
RBC: 2.08 MIL/uL — ABNORMAL LOW (ref 3.87–5.11)
RDW: 18.1 % — ABNORMAL HIGH (ref 11.5–15.5)
WBC: 1.6 10*3/uL — ABNORMAL LOW (ref 4.0–10.5)
nRBC: 0 % (ref 0.0–0.2)

## 2023-11-13 LAB — ECHOCARDIOGRAM COMPLETE
Area-P 1/2: 4.15 cm2
Height: 67 in
P 1/2 time: 781 ms
S' Lateral: 3.4 cm
Weight: 2673.74 [oz_av]

## 2023-11-13 LAB — COMPREHENSIVE METABOLIC PANEL WITH GFR
ALT: 14 U/L (ref 0–44)
AST: 20 U/L (ref 15–41)
Albumin: 2.9 g/dL — ABNORMAL LOW (ref 3.5–5.0)
Alkaline Phosphatase: 55 U/L (ref 38–126)
Anion gap: 6 (ref 5–15)
BUN: 15 mg/dL (ref 8–23)
CO2: 28 mmol/L (ref 22–32)
Calcium: 8.5 mg/dL — ABNORMAL LOW (ref 8.9–10.3)
Chloride: 103 mmol/L (ref 98–111)
Creatinine, Ser: 1.06 mg/dL — ABNORMAL HIGH (ref 0.44–1.00)
GFR, Estimated: 57 mL/min — ABNORMAL LOW (ref >60)
Glucose, Bld: 86 mg/dL (ref 70–99)
Potassium: 3.7 mmol/L (ref 3.5–5.1)
Sodium: 137 mmol/L (ref 135–145)
Total Bilirubin: 0.6 mg/dL (ref 0.0–1.2)
Total Protein: 5.6 g/dL — ABNORMAL LOW (ref 6.5–8.1)

## 2023-11-13 LAB — IRON AND TIBC
Iron: 117 ug/dL (ref 28–170)
Saturation Ratios: 49 % — ABNORMAL HIGH (ref 10.4–31.8)
TIBC: 239 ug/dL — ABNORMAL LOW (ref 250–450)
UIBC: 122 ug/dL

## 2023-11-13 LAB — PREPARE RBC (CROSSMATCH)

## 2023-11-13 LAB — CBG MONITORING, ED
Glucose-Capillary: 72 mg/dL (ref 70–99)
Glucose-Capillary: 91 mg/dL (ref 70–99)

## 2023-11-13 LAB — TROPONIN I (HIGH SENSITIVITY): Troponin I (High Sensitivity): 14 ng/L (ref <18)

## 2023-11-13 LAB — FOLATE: Folate: 14.4 ng/mL (ref >5.9)

## 2023-11-13 LAB — VITAMIN B12: Vitamin B-12: 711 pg/mL (ref 180–914)

## 2023-11-13 MED ORDER — PANTOPRAZOLE SODIUM 40 MG PO TBEC
40.0000 mg | DELAYED_RELEASE_TABLET | Freq: Two times a day (BID) | ORAL | Status: DC
  Administered 2023-11-13 – 2023-11-14 (×3): 40 mg via ORAL
  Filled 2023-11-13 (×3): qty 1

## 2023-11-13 MED ORDER — INSULIN ASPART 100 UNIT/ML IJ SOLN
0.0000 [IU] | Freq: Three times a day (TID) | INTRAMUSCULAR | Status: DC
  Administered 2023-11-14: 3 [IU] via SUBCUTANEOUS

## 2023-11-13 MED ORDER — CLOBETASOL PROPIONATE 0.05 % EX CREA: 1.0000 | TOPICAL_CREAM | Freq: Two times a day (BID) | CUTANEOUS | Status: DC | PRN

## 2023-11-13 MED ORDER — ATORVASTATIN CALCIUM 20 MG PO TABS
20.0000 mg | ORAL_TABLET | Freq: Every day | ORAL | Status: DC
Start: 2023-11-13 — End: 2023-11-14
  Administered 2023-11-13 – 2023-11-14 (×2): 20 mg via ORAL
  Filled 2023-11-13: qty 2
  Filled 2023-11-13: qty 1

## 2023-11-13 MED ORDER — SODIUM CHLORIDE 0.9% IV SOLUTION: Freq: Once | INTRAVENOUS | Status: DC

## 2023-11-13 MED ORDER — ACETAMINOPHEN 325 MG PO TABS
650.0000 mg | ORAL_TABLET | Freq: Four times a day (QID) | ORAL | Status: DC | PRN
  Administered 2023-11-13 – 2023-11-14 (×2): 650 mg via ORAL
  Filled 2023-11-13 (×2): qty 2

## 2023-11-13 MED ORDER — ONDANSETRON HCL 4 MG/2ML IJ SOLN: 4.0000 mg | Freq: Four times a day (QID) | INTRAMUSCULAR | Status: DC | PRN

## 2023-11-13 MED ORDER — ONDANSETRON HCL 4 MG PO TABS
4.0000 mg | ORAL_TABLET | Freq: Four times a day (QID) | ORAL | Status: DC | PRN
Start: 2023-11-13 — End: 2023-11-14

## 2023-11-13 MED ORDER — LISINOPRIL 10 MG PO TABS: 10.0000 mg | ORAL_TABLET | Freq: Every day | ORAL | Status: DC

## 2023-11-13 MED ORDER — SODIUM CHLORIDE 0.9% IV SOLUTION: Freq: Once | INTRAVENOUS | Status: AC

## 2023-11-13 MED ORDER — ACETAMINOPHEN 650 MG RE SUPP: 650.0000 mg | Freq: Four times a day (QID) | RECTAL | Status: DC | PRN

## 2023-11-13 MED ORDER — FUROSEMIDE 10 MG/ML IJ SOLN
20.0000 mg | Freq: Two times a day (BID) | INTRAMUSCULAR | Status: DC
  Administered 2023-11-13: 20 mg via INTRAVENOUS
  Filled 2023-11-13: qty 2

## 2023-11-13 NOTE — Plan of Care (Signed)
  Problem: Education: Goal: Knowledge of General Education information will improve Description Including pain rating scale, medication(s)/side effects and non-pharmacologic comfort measures Outcome: Progressing   Problem: Health Behavior/Discharge Planning: Goal: Ability to manage health-related needs will improve Outcome: Progressing   Problem: Clinical Measurements: Goal: Ability to maintain clinical measurements within normal limits will improve Outcome: Progressing Goal: Diagnostic test results will improve Outcome: Progressing   Problem: Safety: Goal: Ability to remain free from injury will improve Outcome: Progressing   

## 2023-11-13 NOTE — Progress Notes (Signed)
 Mobility Specialist Progress Note:    11/13/23 1524  Therapy Vitals  Patient Position (if appropriate) Orthostatic Vitals  Orthostatic Lying   BP- Lying 150/50  Pulse- Lying 64  Orthostatic Sitting  BP- Sitting 139/63  Pulse- Sitting 70  Orthostatic Standing at 0 minutes  BP- Standing at 0 minutes 108/57  Pulse- Standing at 0 minutes 74  Orthostatic Standing at 3 minutes  BP- Standing at 3 minutes 114/46  Pulse- Standing at 3 minutes 96  Mobility  Activity Dangled on edge of bed;Stood at bedside  Level of Assistance Standby assist, set-up cues, supervision of patient - no hands on  Assistive Device None;Front wheel walker  Distance Ambulated (ft) 0 ft  Range of Motion/Exercises Active;All extremities  Activity Response Tolerated well  Mobility Referral Yes  Mobility visit 1 Mobility  Mobility Specialist Start Time (ACUTE ONLY) 1520  Mobility Specialist Stop Time (ACUTE ONLY) 1540  Mobility Specialist Time Calculation (min) (ACUTE ONLY) 20 min   Pt received in bed, agreeable to mobility. Required SBA to stand with no AD. Pt leans on RW when needed. Tolerated well, notified RN of orthostatics. Left pt sitting EOB, all needs met.  Glinda Lapping Mobility Specialist Please contact via Special educational needs teacher or  Rehab office at [phone]

## 2023-11-13 NOTE — TOC CM/SW Note (Signed)
 Transition of Care [facility]) - Inpatient Brief Assessment   Patient Details  Name: [name] MRN: [id number] Date of Birth: Feb 19, 1956  Transition of Care [facility]) CM/SW Contact:    Grandville Lax, LCSWA Phone Number: 11/13/2023, 10:48 AM   Clinical Narrative: Transition of Care Department [facility]) has reviewed patient and no TOC needs have been identified at this time. We will continue to monitor patient advancement through interdiciplinary progression rounds. If new patient transition needs arise, please place a TOC consult.   Transition of Care Asessment: Insurance and Status: Insurance coverage has been reviewed Patient has primary care physician: Yes Home environment has been reviewed: From home Prior level of function:: Independent Prior/Current Home Services: No current home services Social Drivers of Health Review: SDOH reviewed no interventions necessary Readmission risk has been reviewed: Yes Transition of care needs: no transition of care needs at this time

## 2023-11-13 NOTE — Progress Notes (Signed)
 PROGRESS NOTE    Patient: [name]                            PCP: Darrow Bussing, MD                    DOB: May 25, 1956            DOA: 11/12/2023 WUJ:[id number]             DOS: 11/13/2023, 11:05 AM   LOS: 0 days   Date of Service: The patient was seen and examined on 11/13/2023  Subjective:   The patient was seen and examined this morning. Hemodynamically stable. No issues overnight .  Brief Narrative:   [name] is a 68 y.o. female with medical history significant of stage IV adenocarcinoma of right lung, type 2 diabetes mellitus, hypertension, GERD, hyperlipidemia who presents to the emergency department due to right hip/lower back pain.  She states that she went to the kitchen to get some coffee and on reaching to the cabinet to get a cup, she believes she must of passed out, because the next thing she remembered was sitting on the floor of the kitchen.  She was able to get up from the floor, but she started to have a right hip/right lower back pain that progressively worsened as the day progressed, so she decided to go to the ED for further evaluation and management.  She denies headache, neck pain, lightheadedness, numbness or tingling of extremities.   ED Course: Her Pulse was 50/min, other vital signs were within normal range.  Workup in the ED showed pancytopenia with platelets of 26, WBC 2.1, hemoglobin 7.1.  BMP was normal except for blood glucose of 110, creatinine 1.14.  Urinalysis was normal. CT lumbar spine without contrast showed acute minimally displaced fracture of the right L1 transverse process.  No acute fracture or malalignment of the lumbar spine. CT head without contrast showed No acute intracranial hemorrhage Right hip x-ray was normal. Tylenol was given.  Hospitalist was asked to admit patient for further evaluation and management.      Assessment & Plan:   Principal Problem:   Syncope Active Problems:   Essential hypertension   Mixed  hyperlipidemia   Stage IV adenocarcinoma of lung, right (HCC)   GERD (gastroesophageal reflux disease)   Pancytopenia (HCC)   Thrombocytopenia (HCC)   Type 2 diabetes mellitus with hyperglycemia (HCC)  Syncope - Likely due to hypovolemia, dehydration, severe anemia Continue telemetry and watch for arrhythmias Troponins 15 > 14  - Hemoglobin as low as 6.4, transfusing 2U PRBC -Continue neurochecks  EKG showed normal sinus rhythm at a rate of 62 bpm -Echocardiogram  -Carotid artery Dopplers will be done to rule out stenosis -    Pancytopenia possibly due to chemotherapy WBC 2.1, hemoglobin 6 4, platelets 26 -11/13/23  2U PRBC + 2U platelet transfusion Oncologist on-call (Dr. Cherly Hensen) was consulted and recommended platelets transfusion due to patient's thrombocytopenia per ED PA    Latest Ref Rng & Units 11/13/2023    6:44 AM 11/13/2023    5:10 AM 11/12/2023   10:51 PM  CBC  WBC 4.0 - 10.5 K/uL  1.6  2.1   Hemoglobin 12.0 - 15.0 g/dL 6.4  5.8  7.1   Hematocrit 36.0 - 46.0 % 18.7  16.8  21.1   Platelets 150 - 400 K/uL  30  26  Essential hypertension HOLD lisinopril   Mixed hyperlipidemia - Continue Lipitor   GERD - Continue Protonix   Type 2 diabetes mellitus Hemoglobin A1c on 10/23/2023 was 7.8 Continue ISS and hypoglycemia protocol   Stage IV Adenocarcinoma of right lung Patient is on chemotherapy and follows with Dr. Shirline Frees Last office visit was on 10/31/23      ------------------------------------------------------------------------------------------------------------------------------------- Nutritional status:  The patient's BMI is: Body mass index is 26.17 kg/m. I agree with the assessment and plan as outlined -------------------------------------------------------------------------------------------------------------------------------------  DVT prophylaxis:  SCDs Start: 11/13/23 0359   Code Status:   Code Status: Full Code  Family Communication: No  family member present at bedside--Advance care planning has been discussed.   Admission status:   Status is: Observation The patient remains OBS appropriate and will d/c before 2 midnights.   Disposition: From  - home             Planning for discharge in 1-2 days: to Home   Procedures:   No admission procedures for hospital encounter.   Antimicrobials:  Anti-infectives (From admission, onward)    None        Medication:   sodium chloride   Intravenous Once   atorvastatin  20 mg Oral Daily   insulin aspart  0-15 Units Subcutaneous TID WC   pantoprazole  40 mg Oral BID    acetaminophen **OR** acetaminophen, clobetasol cream, ondansetron **OR** ondansetron (ZOFRAN) IV   Objective:   Vitals:   11/13/23 0930 11/13/23 0945 11/13/23 1000 11/13/23 1015  BP: 135/67  (!) 117/54   Pulse: 78 65 64 (!) 54  Resp: (!) 25 (!) 23 17 14   Temp:    97.8 F (36.6 C)  TempSrc:    Oral  SpO2: 100% 100% 99% 100%  Weight:      Height:        Intake/Output Summary (Last 24 hours) at 11/13/2023 1105 Last data filed at 11/13/2023 1028 Gross per 24 hour  Intake 639.16 ml  Output --  Net 639.16 ml   Filed Weights   11/12/23 1809  Weight: 75.8 kg     Physical examination:   Constitution:  Alert, cooperative, no distress,  Appears calm and comfortable  Psychiatric:   Normal and stable mood and affect, cognition intact,   HEENT:        Normocephalic, PERRL, otherwise with in Normal limits  Chest:         Chest symmetric Cardio vascular:  S1/S2, RRR, No murmure, No Rubs or Gallops  pulmonary: Clear to auscultation bilaterally, respirations unlabored, negative wheezes / crackles Abdomen: Soft, non-tender, non-distended, bowel sounds,no masses, no organomegaly Muscular skeletal: Limited exam - in bed, able to move all 4 extremities,   Neuro: CNII-XII intact. , normal motor and sensation, reflexes intact  Extremities: No pitting edema lower extremities, +2 pulses  Skin: Dry, warm  to touch, negative for any Rashes, No open wounds Wounds: per nursing documentation   ------------------------------------------------------------------------------------------------------------------------------------------    LABs:     Latest Ref Rng & Units 11/13/2023    6:44 AM 11/13/2023    5:10 AM 11/12/2023   10:51 PM  CBC  WBC 4.0 - 10.5 K/uL  1.6  2.1   Hemoglobin 12.0 - 15.0 g/dL 6.4  5.8  7.1   Hematocrit 36.0 - 46.0 % 18.7  16.8  21.1   Platelets 150 - 400 K/uL  30  26       Latest Ref Rng & Units 11/13/2023    5:10 AM  11/12/2023   10:51 PM 11/06/2023    2:29 PM  CMP  Glucose 70 - 99 mg/dL 86  191  478   BUN 8 - 23 mg/dL 15  15  18    Creatinine 0.44 - 1.00 mg/dL 2.95  6.21  3.08   Sodium 135 - 145 mmol/L 137  135  135   Potassium 3.5 - 5.1 mmol/L 3.7  3.5  4.1   Chloride 98 - 111 mmol/L 103  100  97   CO2 22 - 32 mmol/L 28  24  29    Calcium 8.9 - 10.3 mg/dL 8.5  8.8  8.9   Total Protein 6.5 - 8.1 g/dL 5.6  6.6  6.4   Total Bilirubin 0.0 - 1.2 mg/dL 0.6  0.4  0.8   Alkaline Phos 38 - 126 U/L 55  67  66   AST 15 - 41 U/L 20  29  26    ALT 0 - 44 U/L 14  16  18         Micro Results No results found for this or any previous visit (from the past 240 hours).  Radiology Reports CT Lumbar Spine Wo Contrast Result Date: 11/12/2023 CLINICAL DATA:  Back trauma, no prior imaging (Age >= 16y) h/o cancer with fall, metastatic lung cancer EXAM: CT LUMBAR SPINE WITHOUT CONTRAST TECHNIQUE: Multidetector CT imaging of the lumbar spine was performed without intravenous contrast administration. Multiplanar CT image reconstructions were also generated. RADIATION DOSE REDUCTION: This exam was performed according to the departmental dose-optimization program which includes automated exposure control, adjustment of the mA and/or kV according to patient size and/or use of iterative reconstruction technique. COMPARISON:  10/22/2023 FINDINGS: Segmentation: 5 lumbar type vertebrae. Alignment:  Normal. Vertebrae: There is an acute minimally displaced fracture of the right L1 transverse process. No acute fracture identified. Vertebral body height is preserved. Modic changes noted within the adjoining L2-3 endplates. No suspicious lytic or blastic bone lesion. Paraspinal and other soft tissues: Negative. Disc levels: There is intervertebral disc space narrowing throughout the lumbar spine with associated vacuum disc phenomena at L2-3, L3-4, and L5-S1 in keeping with changes of moderate to severe degenerative disc disease, most severe at L2-3. Multifactorial central canal stenosis, severe at L2-3 and moderate at L1-2 and L3-4. Facet hypertrophy results in severe neuroforaminal narrowing on the left at L3-4 and L4-5 with probable abutment of the exiting L3-L4 nerve roots. Impingement of the left lateral recess at L3-4 and L4-5 also noted IMPRESSION: 1. Acute minimally displaced fracture of the right L1 transverse process. 2. No acute fracture or malalignment of the lumbar spine. 3. Multilevel degenerative disc disease and facet arthropathy resulting in multilevel central canal stenosis, severe at L2-3 and moderate at L1-2 and L3-4. 4. Severe neuroforaminal narrowing on the left at L3-4 and L4-5 with probable abutment of the exiting L3 and L4 nerve roots. If indicated, this could be better assessed with dedicated MRI examination Electronically Signed   By: Helyn Numbers M.D.   On: 11/12/2023 23:45   CT Head Wo Contrast Result Date: 11/12/2023 CLINICAL DATA:  Head trauma, minor (Age >= 65y) h/o cancer having syncope EXAM: CT HEAD WITHOUT CONTRAST TECHNIQUE: Contiguous axial images were obtained from the base of the skull through the vertex without intravenous contrast. RADIATION DOSE REDUCTION: This exam was performed according to the departmental dose-optimization program which includes automated exposure control, adjustment of the mA and/or kV according to patient size and/or use of iterative  reconstruction technique. COMPARISON:  10/07/2023  FINDINGS: Brain: Subtle areas of vasogenic edema are identified within the frontal lobes bilaterally related to the numerous intra-axial metastatic foci noted on prior MRI examination. These are not visualized, however, on the current examination due to changes in modality. No acute intracranial hemorrhage. No abnormal mass effect or midline shift. No abnormal extra-axial fluid collection. Ventricular size is normal. Cerebellum is unremarkable. Vascular: No hyperdense vessel or unexpected calcification. Skull: Right temporal parosteal osteoma is unchanged. No focal lytic lesion. No acute fracture. Sinuses/Orbits: No acute finding. Other: Mastoid air cells and middle ear cavities are clear. IMPRESSION: 1. No acute intracranial hemorrhage. 2. Subtle areas of vasogenic edema are identified within the frontal lobes bilaterally related to the numerous intra-axial metastatic foci noted on prior MRI examination. These foci are not visualized, however, on the current examination due to changes in modality. Electronically Signed   By: Worthy Heads M.D.   On: 11/12/2023 23:31   DG HIP UNILAT WITH PELVIS 2-3 VIEWS RIGHT Result Date: 11/12/2023 CLINICAL DATA:  Fall EXAM: DG HIP (WITH OR WITHOUT PELVIS) 2-3V RIGHT COMPARISON:  None Available. FINDINGS: There is no evidence of hip fracture or dislocation. There is no evidence of arthropathy or other focal bone abnormality. IMPRESSION: Negative. Electronically Signed   By: Tyron Gallon M.D.   On: 11/12/2023 23:31    SIGNED: Bobbetta Burnet, MD, FHM. FAAFP. Arlin Benes - [facility] hospital[facility] Time spent - 35 min.  In seeing, evaluating and examining the patient. Reviewing medical records, labs, drawn plan of care. [facility],  Pager (please use [email or web address] to page/ text) Please use Epic Secure Chat for non-urgent communication (7AM-7PM)  If 7PM-7AM, please contact night-coverage [email or web address], 11/13/2023, 11:05  AM

## 2023-11-13 NOTE — ED Notes (Signed)
 Patient has contusion on left arm from a lab needle stick, so provided an ice pack and bedding to elevate the arm.

## 2023-11-13 NOTE — Research (Signed)
 TRIAL A PHASE III TRIAL OF STEREOTACTIC RADIOSURGERY COMPARED WITH HIPPOCAMPAL-AVOIDANT WHOLE BRAIN RADIOTHERAPY (HA-WBRT) PLUS MEMANTINE FOR 5 OR MORE BRAIN METASTASES   ADVERSE EVENT LOG:  Mardella C Rijos [id number]   11/13/2023   Adverse Event Log   Study/Protocol: CE.7 Hospitalization Admitting AE's reviewed for reporting purposes.  The pt's hospitalization was considered serious, unexpected and unlikely or unrelated to the pt's study treatment by the study PI, Dr. [name]. The study confirmed that this episode does NOT require expedited reporting as a SAE.     Event Grade Onset Date Resolved Date Attribution to Study Intervention Treatment Comments  Syncope Grade 3 11/12/23   Unlikely  observation Pt states she "passed out".   White Blood cell decreased Grade 3 11/12/23   Unrelated   WBC's 1.6 on 11/13/23 (2.1 on 11/12/23)  Anemia Grade 3 11/12/23   Unrelated  2U PRBC's Hemoglobin 7.1 in ED, hemoglobin 5.8 today  Platelet count decreased Grade 3 11/12/23   Unrelated  platelet transfusion Platelets 26 in ED, platelets 30 today  Spine fracture  Grade 3  11/12/23  Unrelated  Closed fracture of transverse process of lumbar vertebra  Back Pain  Grade 3  11/12/23  Unrelated  Acute right sided low back pain on admission    Will continue to monitor pt's hospitalization and report her adverse events accordingly.  Elfida Grinder RN, BSN, CCRP Clinical Research Nurse Lead 11/13/2023 4:38 PM

## 2023-11-13 NOTE — Progress Notes (Signed)
*  PRELIMINARY RESULTS* Echocardiogram 2D Echocardiogram has been performed.  Bernis Brisker 11/13/2023, 3:37 PM

## 2023-11-13 NOTE — Hospital Course (Addendum)
 [name] is a 68 y.o. female with medical history significant of stage IV adenocarcinoma of right lung, type 2 diabetes mellitus, hypertension, GERD, hyperlipidemia who presents to the emergency department due to right hip/lower back pain.  She states that she went to the kitchen to get some coffee and on reaching to the cabinet to get a cup, she believes she must of passed out, because the next thing she remembered was sitting on the floor of the kitchen.  She was able to get up from the floor, but she started to have a right hip/right lower back pain that progressively worsened as the day progressed, so she decided to go to the ED for further evaluation and management.  She denies headache, neck pain, lightheadedness, numbness or tingling of extremities.   ED Course: Her Pulse was 50/min, other vital signs were within normal range.  Workup in the ED showed pancytopenia with platelets of 26, WBC 2.1, hemoglobin 7.1.  BMP was normal except for blood glucose of 110, creatinine 1.14.  Urinalysis was normal. CT lumbar spine without contrast showed acute minimally displaced fracture of the right L1 transverse process.  No acute fracture or malalignment of the lumbar spine. CT head without contrast showed No acute intracranial hemorrhage Right hip x-ray was normal. Tylenol was given.  Hospitalist was asked to admit patient for further evaluation and management.      Assessment & Plan:   Principal Problem:   Syncope Active Problems:   Essential hypertension   Mixed hyperlipidemia   Stage IV adenocarcinoma of lung, right (HCC)   GERD (gastroesophageal reflux disease)   Pancytopenia (HCC)   Thrombocytopenia (HCC)   Type 2 diabetes mellitus with hyperglycemia (HCC)  Syncope - Likely due to hypovolemia, dehydration, severe anemia Continue telemetry and watch for arrhythmias Troponins 15 > 14  - Hemoglobin as low as 6.4, transfusing 2U PRBC -Continue neurochecks  EKG showed normal sinus  rhythm at a rate of 62 bpm -Echocardiogram  -Carotid artery Dopplers will be done to rule out stenosis -    Pancytopenia possibly due to chemotherapy WBC 2.1, hemoglobin 6 4, platelets 26 -11/13/23  2U PRBC + 2U platelet transfusion Oncologist on-call ([name]) was consulted and recommended platelets transfusion due to patient's thrombocytopenia per ED PA    Latest Ref Rng & Units 11/13/2023    6:44 AM 11/13/2023    5:10 AM 11/12/2023   10:51 PM  CBC  WBC 4.0 - 10.5 K/uL  1.6  2.1   Hemoglobin 12.0 - 15.0 g/dL 6.4  5.8  7.1   Hematocrit 36.0 - 46.0 % 18.7  16.8  21.1   Platelets 150 - 400 K/uL  30  26        Essential hypertension HOLD lisinopril   Mixed hyperlipidemia - Continue Lipitor   GERD - Continue Protonix   Type 2 diabetes mellitus Hemoglobin A1c on 10/23/2023 was 7.8 Continue ISS and hypoglycemia protocol   Stage IV Adenocarcinoma of right lung Patient is on chemotherapy and follows with [name] Last office visit was on 10/31/23

## 2023-11-13 NOTE — ED Provider Notes (Signed)
 [place] EMERGENCY DEPARTMENT AT [facility] Provider Note   CSN: [id number] Arrival date & time: 11/12/23  1751     History Chief Complaint  Patient presents with   Back Pain    [name] is a 68 y.o. female with history of adenocarcinoma stage IV, diabetes, on Eliquis presents to the emergency department today for evaluation of right hip/lower back pain.  Patient reports that she had a fall earlier today.  She reports that she reached up in the cabinet to get a cup and exiting she knows she was sitting on the ground.  She is unsure what happened in between those times.  She reports she is coming in as her hip pain did not get any better and was concerned.  She denies any numbness or tingling.  Denies any headache.  She reports that she does not think that she hit her head.  Denies any neck pain.  She denies any fecal or urinary incontinence.  Denies any saddle anesthesia.  She denies any chest pain, shortness breath, or feelings of lightheadedness before this likely syncopal episode.  She reports that she is felt "off" for the past week.  Sees Dr. Arbutus Ped at [facility] for [facility] treatment.   Back Pain Associated symptoms: no chest pain, no dysuria, no fever, no headaches, no numbness and no weakness       Home Medications Prior to Admission medications   Medication Sig Start Date End Date Taking? Authorizing Provider  ACCU-CHEK AVIVA PLUS test strip USE TO MONITOR GLUCOSE LEVELS TWICE A DAY E11.9 11/27/21   Romero Belling, MD  ACCU-CHEK SOFTCLIX LANCETS lancets Use to monitor glucose levels BID; E11.9 Patient taking differently: 1 each by Other route 2 (two) times daily. E11.9 08/21/18   Romero Belling, MD  acetaminophen (TYLENOL) 500 MG tablet Take 1 tablet (500 mg total) by mouth every 6 (six) hours as needed. Patient taking differently: Take 500 mg by mouth every 6 (six) hours as needed (for pain). 08/07/17   Horton, Mayer Masker, MD  APIXABAN Everlene Balls) VTE STARTER PACK  (10MG  AND 5MG ) Take as directed on package: start with two-5mg  tablets twice daily for 7 days. On day 8, switch to one-5mg  tablet twice daily. Patient not taking: Reported on 10/23/2023 10/22/23   Si Gaul, MD  atorvastatin (LIPITOR) 20 MG tablet Take 20 mg by mouth daily. 04/27/17   [provider]  Blood Glucose Calibration (GLUCOSE CONTROL) SOLN 1 Bottle by In Vitro route as needed. Use to calibrate Accu-Chek Aviva Plus device prn; please provide control solution compatible with Accu-Chek Aviva Plus device. Patient taking differently: 1 each by Other route as needed (Use to calibrate glucometer). E11.9 08/21/18   Romero Belling, MD  Blood Glucose Monitoring Suppl (ACCU-CHEK AVIVA PLUS) w/Device KIT 1 each by Does not apply route 2 (two) times daily. Use to monitor glucose levels BID; E11.9 Patient taking differently: 1 each by Does not apply route 2 (two) times daily. E11.9 08/19/18   Romero Belling, MD  chlorthalidone (HYGROTON) 25 MG tablet Take 12.5 mg by mouth daily.  11/26/16   [provider]  clobetasol cream (TEMOVATE) 0.05 % Apply 1 Application topically 2 (two) times daily as needed (itching).    [provider]  cyclobenzaprine (FLEXERIL) 10 MG tablet TAKE 1 TABLET BY MOUTH TWICE A DAY AS NEEDED FOR MUSCLE SPASMS    [provider]  diclofenac Sodium (VOLTAREN ARTHRITIS PAIN) 1 % GEL Apply 4 g topically 4 (four) times  daily. Patient taking differently: Apply 4 g topically 3 (three) times daily as needed (joint pain). 05/15/22   Leath-Warren, Sadie Haber, NP  escitalopram (LEXAPRO) 10 MG tablet Take 1 tablet (10 mg total) by mouth every morning. 10/25/23   Myrlene Broker, MD  folic acid (FOLVITE) 1 MG tablet Take 1 tablet (1 mg total) by mouth daily. Start 7 days before pemetrexed chemotherapy. Continue until 21 days after pemetrexed completed. 08/06/23   Si Gaul, MD  HYDROcodone bit-homatropine [facility]) 5-1.5 MG/5ML syrup Take 5 mLs by mouth  every 6 (six) hours as needed for cough. Patient not taking: Reported on 10/23/2023 10/02/23   Si Gaul, MD  ibuprofen (ADVIL) 800 MG tablet Take 800 mg by mouth every 8 (eight) hours as needed for moderate pain.    [provider]  insulin glargine (LANTUS SOLOSTAR) 100 UNIT/ML Solostar Pen Inject 15 Units into the skin at bedtime. Patient not taking: Reported on 10/23/2023 06/25/23   Dani Gobble, NP  Insulin Pen Needle (B-D ULTRAFINE III SHORT PEN) 31G X 8 MM MISC USE AS DIRECTED 01/17/23   Dani Gobble, NP  ipratropium (ATROVENT) 0.03 % nasal spray Place 2 sprays into both nostrils 2 (two) times daily. 08/27/23   [provider]  lidocaine-prilocaine (EMLA) cream Apply to affected area once 08/06/23   Si Gaul, MD  lisinopril (ZESTRIL) 10 MG tablet Take 10 mg by mouth daily. 05/30/20   [provider]  meloxicam (MOBIC) 7.5 MG tablet Take 7.5 mg by mouth daily. 07/10/23   [provider]  memantine (NAMENDA) 10 MG tablet Take 1 tablet (10 mg total) by mouth 2 (two) times daily. Take through 01-20-24, then stop. 10/18/23   Lonie Peak, MD  ondansetron (ZOFRAN) 8 MG tablet Take 1 tablet (8 mg total) by mouth every 8 (eight) hours as needed for nausea or vomiting. Start on the third day after carboplatin. 08/06/23   Si Gaul, MD  pantoprazole (PROTONIX) 40 MG tablet Take 1 tablet (40 mg total) by mouth 2 (two) times daily. 01/11/23 01/11/24  Lanelle Bal, DO  prochlorperazine (COMPAZINE) 10 MG tablet Take 1 tablet (10 mg total) by mouth every 6 (six) hours as needed for nausea or vomiting. 08/06/23   Si Gaul, MD  traZODone (DESYREL) 100 MG tablet Take 2 tablets (200 mg total) by mouth at bedtime. 10/25/23   Myrlene Broker, MD  triamcinolone cream (KENALOG) 0.1 % Apply 1 application topically 2 (two) times daily. Patient taking differently: Apply 1 application  topically 2 (two) times daily as needed (irritation). 03/30/21   Wurst,  [place], PA-C      Allergies    Synthroid [levothyroxine sodium], Hydrochlorothiazide, and Iodine    Review of Systems   Review of Systems  Constitutional:  Negative for chills and fever.  Respiratory:  Negative for shortness of breath.   Cardiovascular:  Negative for chest pain.  Gastrointestinal:  Negative for abdominal distention, constipation, diarrhea, nausea and vomiting.       Denies any fecal incontinence  Genitourinary:  Negative for dysuria and hematuria.       Denies any urinary incontinence or urinary retention.  Musculoskeletal:  Positive for back pain. Negative for neck pain and neck stiffness.       Denies any saddle anesthesia  Neurological:  Positive for syncope. Negative for weakness, numbness and headaches.    Physical Exam Updated Vital Signs BP 131/64   Pulse 64   Temp 98.3 F (36.8 C)  Resp 16   Ht 5\' 7"  (1.702 m)   Wt 75.8 kg   SpO2 99%   BMI 26.17 kg/m  Physical Exam Constitutional:      Appearance: She is not toxic-appearing.     Comments: Chronically ill appearing  Eyes:     General: No scleral icterus. Cardiovascular:     Rate and Rhythm: Normal rate.     Pulses:          Radial pulses are 2+ on the right side and 2+ on the left side.       Dorsalis pedis pulses are 2+ on the right side and 2+ on the left side.       Posterior tibial pulses are 2+ on the right side and 2+ on the left side.  Pulmonary:     Effort: Pulmonary effort is normal. No respiratory distress.  Abdominal:     Palpations: Abdomen is soft.     Tenderness: There is no abdominal tenderness.  Musculoskeletal:        General: Tenderness present.     Cervical back: Normal range of motion. No tenderness.       Back:     Right lower leg: No edema.     Left lower leg: No edema.     Comments: Tenderness to the marked area. No step off or deformity.  Strength and sensation intact and symmetric in patient's bilateral upper and lower extremities.  Palpable radial, DP, PT  pulses.  Compartments are soft throughout.  Sensation reportedly intact throughout as well per patient.  Skin:    General: Skin is warm and dry.  Neurological:     General: No focal deficit present.     Mental Status: She is alert and oriented to person, place, and time.     Cranial Nerves: No cranial nerve deficit, dysarthria or facial asymmetry.     Sensory: No sensory deficit.     Motor: No weakness.     Coordination: Finger-Nose-Finger Test normal.     ED Results / Procedures / Treatments   Labs (all labs ordered are listed, but only abnormal results are displayed) Labs Reviewed  URINALYSIS, ROUTINE W REFLEX MICROSCOPIC - Abnormal; Notable for the following components:      Result Value   APPearance HAZY (*)    Protein, ur 30 (*)    Leukocytes,Ua MODERATE (*)    Bacteria, UA RARE (*)    All other components within normal limits  CBC WITH DIFFERENTIAL/PLATELET - Abnormal; Notable for the following components:   WBC 2.1 (*)    RBC 2.60 (*)    Hemoglobin 7.1 (*)    HCT 21.1 (*)    RDW 18.1 (*)    Platelets 26 (*)    Neutro Abs 0.6 (*)    All other components within normal limits  COMPREHENSIVE METABOLIC PANEL WITH GFR - Abnormal; Notable for the following components:   Glucose, Bld 110 (*)    Creatinine, Ser 1.14 (*)    Calcium 8.8 (*)    Albumin 3.4 (*)    GFR, Estimated 52 (*)    All other components within normal limits  PREPARE PLATELET PHERESIS  TYPE AND SCREEN  TROPONIN I (HIGH SENSITIVITY)  TROPONIN I (HIGH SENSITIVITY)    EKG None  Radiology CT Lumbar Spine Wo Contrast Result Date: 11/12/2023 CLINICAL DATA:  Back trauma, no prior imaging (Age >= 16y) h/o cancer with fall, metastatic lung cancer EXAM: CT LUMBAR SPINE WITHOUT CONTRAST TECHNIQUE: Multidetector CT imaging  of the lumbar spine was performed without intravenous contrast administration. Multiplanar CT image reconstructions were also generated. RADIATION DOSE REDUCTION: This exam was performed  according to the departmental dose-optimization program which includes automated exposure control, adjustment of the mA and/or kV according to patient size and/or use of iterative reconstruction technique. COMPARISON:  10/22/2023 FINDINGS: Segmentation: 5 lumbar type vertebrae. Alignment: Normal. Vertebrae: There is an acute minimally displaced fracture of the right L1 transverse process. No acute fracture identified. Vertebral body height is preserved. Modic changes noted within the adjoining L2-3 endplates. No suspicious lytic or blastic bone lesion. Paraspinal and other soft tissues: Negative. Disc levels: There is intervertebral disc space narrowing throughout the lumbar spine with associated vacuum disc phenomena at L2-3, L3-4, and L5-S1 in keeping with changes of moderate to severe degenerative disc disease, most severe at L2-3. Multifactorial central canal stenosis, severe at L2-3 and moderate at L1-2 and L3-4. Facet hypertrophy results in severe neuroforaminal narrowing on the left at L3-4 and L4-5 with probable abutment of the exiting L3-L4 nerve roots. Impingement of the left lateral recess at L3-4 and L4-5 also noted IMPRESSION: 1. Acute minimally displaced fracture of the right L1 transverse process. 2. No acute fracture or malalignment of the lumbar spine. 3. Multilevel degenerative disc disease and facet arthropathy resulting in multilevel central canal stenosis, severe at L2-3 and moderate at L1-2 and L3-4. 4. Severe neuroforaminal narrowing on the left at L3-4 and L4-5 with probable abutment of the exiting L3 and L4 nerve roots. If indicated, this could be better assessed with dedicated MRI examination Electronically Signed   By: Worthy Heads M.D.   On: 11/12/2023 23:45   CT Head Wo Contrast Result Date: 11/12/2023 CLINICAL DATA:  Head trauma, minor (Age >= 65y) h/o cancer having syncope EXAM: CT HEAD WITHOUT CONTRAST TECHNIQUE: Contiguous axial images were obtained from the base of the skull  through the vertex without intravenous contrast. RADIATION DOSE REDUCTION: This exam was performed according to the departmental dose-optimization program which includes automated exposure control, adjustment of the mA and/or kV according to patient size and/or use of iterative reconstruction technique. COMPARISON:  10/07/2023 FINDINGS: Brain: Subtle areas of vasogenic edema are identified within the frontal lobes bilaterally related to the numerous intra-axial metastatic foci noted on prior MRI examination. These are not visualized, however, on the current examination due to changes in modality. No acute intracranial hemorrhage. No abnormal mass effect or midline shift. No abnormal extra-axial fluid collection. Ventricular size is normal. Cerebellum is unremarkable. Vascular: No hyperdense vessel or unexpected calcification. Skull: Right temporal parosteal osteoma is unchanged. No focal lytic lesion. No acute fracture. Sinuses/Orbits: No acute finding. Other: Mastoid air cells and middle ear cavities are clear. IMPRESSION: 1. No acute intracranial hemorrhage. 2. Subtle areas of vasogenic edema are identified within the frontal lobes bilaterally related to the numerous intra-axial metastatic foci noted on prior MRI examination. These foci are not visualized, however, on the current examination due to changes in modality. Electronically Signed   By: Worthy Heads M.D.   On: 11/12/2023 23:31   DG HIP UNILAT WITH PELVIS 2-3 VIEWS RIGHT Result Date: 11/12/2023 CLINICAL DATA:  Fall EXAM: DG HIP (WITH OR WITHOUT PELVIS) 2-3V RIGHT COMPARISON:  None Available. FINDINGS: There is no evidence of hip fracture or dislocation. There is no evidence of arthropathy or other focal bone abnormality. IMPRESSION: Negative. Electronically Signed   By: Tyron Gallon M.D.   On: 11/12/2023 23:31    Procedures .Critical Care  Performed  by: Spence Dux, PA-C Authorized by: Spence Dux, PA-C   Critical care provider  statement:    Critical care time (minutes):  30   Critical care was necessary to treat or prevent imminent or life-threatening deterioration of the following conditions:  Circulatory failure   Critical care was time spent personally by me on the following activities:  Development of treatment plan with patient or surrogate, discussions with consultants, evaluation of patient's response to treatment, examination of patient, ordering and review of laboratory studies, ordering and review of radiographic studies, ordering and performing treatments and interventions, pulse oximetry, re-evaluation of patient's condition and review of old charts   Care discussed with: admitting provider   Comments:     Pancytopenia, remarkably low platelets at 26 requiring transfusion and admission.    Medications Ordered in ED Medications  0.9 %  sodium chloride infusion (Manually program via Guardrails IV Fluids) (has no administration in time range)  acetaminophen (TYLENOL) tablet 1,000 mg (1,000 mg Oral Given 11/12/23 2245)    ED Course/ Medical Decision Making/ A&P Clinical Course as of 11/13/23 0057  Wed Nov 13, 2023  0000 Consulted oncology. Awaiting callback. [RR]    Clinical Course User Index [RR] Spence Dux, PA-C    Medical Decision Making Amount and/or Complexity of Data Reviewed Labs: ordered. Radiology: ordered.  Risk OTC drugs. Prescription drug management. Decision regarding hospitalization.   68 y.o. female presents to the ER for evaluation of syncopal episode with lower back pain. Differential diagnosis includes but is not limited to CVA, ACS, arrhythmia, vasovagal / orthostatic hypotension, sepsis, hypoglycemia, electrolyte disturbance, respiratory failure, anemia, dehydration, heat injury, polypharmacy, malignancy, anxiety/panic attack, trauma, fracture. Vital signs unremarkable. Physical exam as noted above.   Patient requesting only Tylenol for pain and does not want anything  stronger.  Tylenol ordered  I independently reviewed and interpreted the patient's labs.  CBC shows leukopenia white blood cell count of 2.1.  Hemoglobin at 7.1.  Looks to be around patient's new baseline between 7 and 8.  Platelets are significantly low at 26.  This is a new finding for patient.  CMP shows glucose at 110, creatinine 1.14.  Calcium 8.8 with a total albumin at 3.4.  No other electrolyte or LFT abnormalities.  Troponin at 14.  Urinalysis shows hazy urine with 30 protein.  Moderate leukocytes and 21-50 white blood cells with rare bacteria.  Patient is not having any urinary symptoms.  Will culture.  CT head 1. No acute intracranial hemorrhage. 2. Subtle areas of vasogenic edema are identified within the frontal lobes bilaterally related to the numerous intra-axial metastatic foci noted on prior MRI examination. These foci are not visualized, however, on the current examination due to changes in modality. Per radiologist's interpretation.    CT lumbar 1. Acute minimally displaced fracture of the right L1 transverse process. 2. No acute fracture or malalignment of the lumbar spine. 3. Multilevel degenerative disc disease and facet arthropathy resulting in multilevel central canal stenosis, severe at L2-3 and moderate at L1-2 and L3-4. 4. Severe neuroforaminal narrowing on the left at L3-4 and L4-5 with probable abutment of the exiting L3 and L4 nerve roots. If indicated, this could be better assessed with dedicated MRI examination   XR of hip/pelvis unremarkable.  I consulted oncology. I could not get a hold of Dr. Pegge Bow. I was able to get a hold of Dr. Alita Irwin. His recommendations are 1 unit of platelets and check until >50k. He reports that there are no special  requirement or treatment needed to the platelets necessary.   Given the patients blood counts, will need to keep for transfusion. I am also concerned for her syncopal episode has this did not have a prodrome. Could be related to  thrombocytopenia, however may need to be investigated further inpatient.   Portions of this report may have been transcribed using voice recognition software. Every effort was made to ensure accuracy; however, inadvertent computerized transcription errors may be present.    Final Clinical Impression(s) / ED Diagnoses Final diagnoses:  Pancytopenia (HCC)  Acute right-sided low back pain without sciatica  Closed fracture of transverse process of lumbar vertebra, initial encounter (HCC)  Syncope, unspecified syncope type    Rx / DC Orders ED Discharge Orders     None         Spence Dux, PA-C 11/13/23 0108    Tonya Fredrickson, MD 11/13/23 1106

## 2023-11-13 NOTE — Care Management Obs Status (Signed)
 MEDICARE OBSERVATION STATUS NOTIFICATION   Patient Details  Name: [name] MRN: [id number] Date of Birth: [birth year]   Medicare Observation Status Notification Given:  Yes    Geraldina Klinefelter, RN 11/13/2023, 4:42 PM

## 2023-11-13 NOTE — H&P (Signed)
 History and Physical    Patient: [name]:[id number] DOB: 1955/09/18 DOA: 11/12/2023 DOS: the patient was seen and examined on 11/13/2023 PCP: Molly Pinal, MD  Patient coming from: Home  Chief Complaint:  Chief Complaint  Patient presents with   Back Pain   HPI: [name] is a 68 y.o. female with medical history significant of stage IV adenocarcinoma of right lung, type 2 diabetes mellitus, hypertension, GERD, hyperlipidemia who presents to the emergency department due to right hip/lower back pain.  She states that she went to the kitchen to get some coffee and on reaching to the cabinet to get a cup, she believes she must of passed out, because the next thing she remembered was sitting on the floor of the kitchen.  She was able to get up from the floor, but she started to have a right hip/right lower back pain that progressively worsened as the day progressed, so she decided to go to the ED for further evaluation and management.  She denies headache, neck pain, lightheadedness, numbness or tingling of extremities.  ED Course:  In the emergency department, pulse was 50/min, other vital signs were within normal range.  Workup in the ED showed pancytopenia with platelets of 26, WBC 2.1, hemoglobin 7.1.  BMP was normal except for blood glucose of 110, creatinine 1.14.  Urinalysis was normal. CT lumbar spine without contrast showed acute minimally displaced fracture of the right L1 transverse process.  No acute fracture or malalignment of the lumbar spine. CT head without contrast showed no acute intracranial hemorrhage Right hip x-ray was normal. Tylenol was given.  Hospitalist was asked to admit patient for further evaluation and management.   Review of Systems: Review of systems as noted in the HPI. All other systems reviewed and are negative.   Past Medical History:  Diagnosis Date   Adenocarcinoma of right lung, stage 1 (HCC) 11/29/2016   Cancer (HCC)    Chronic  pain    Depression    takes Lexapro daily   Diabetes mellitus    takes Metformin daily and has an insulin pump.Fasting blood sugar runs250   GERD (gastroesophageal reflux disease)    takes Pantoprazole daily   History of colon polyps    benign   Hyperlipidemia    takes Atorvastatin daily   Hypertension    takes Amlodipine and Lisinopril daily   Insomnia    takes Trazodone nightly   Nocturia    Thyroid disease    Urinary frequency    Urinary urgency    Past Surgical History:  Procedure Laterality Date   ABDOMINAL HYSTERECTOMY     ABDOMINAL SURGERY     BIOPSY  05/28/2019   Procedure: BIOPSY;  Surgeon: Molly Jubilee, MD;  [facility]: [facility];  Service: Endoscopy;;  random colon/gastric/duodenum   BIOPSY  01/11/2023   Procedure: BIOPSY;  Surgeon: Molly Greening, DO;  [facility]: [facility];  Service: Endoscopy;;   COLONOSCOPY  2005 MAC   TICs, IH   COLONOSCOPY N/A 03/08/2014   Procedure: COLONOSCOPY;  Surgeon: Molly Jubilee, MD;  [facility]: [facility];  Service: Endoscopy;  Laterality: N/A;  1015   COLONOSCOPY WITH PROPOFOL N/A 05/28/2019   Procedure: COLONOSCOPY WITH PROPOFOL;  Surgeon: Molly Jubilee, MD;  [facility]: [facility];  Service: Endoscopy;  Laterality: N/A;  9:15am   CYSTOSTOMY  11/22/2011   Procedure: CYSTOSTOMY SUPRAPUBIC;  Surgeon: Molly Caper, MD;  Location: [facility];  Service: Urology;  Laterality:  N/A;   ESOPHAGOGASTRODUODENOSCOPY N/A 03/08/2014   Procedure: ESOPHAGOGASTRODUODENOSCOPY (EGD);  Surgeon: Molly Bali, MD;  [facility];  Service: Endoscopy;  Laterality: N/A;   ESOPHAGOGASTRODUODENOSCOPY     ESOPHAGOGASTRODUODENOSCOPY (EGD) WITH PROPOFOL N/A 05/28/2019   Procedure: ESOPHAGOGASTRODUODENOSCOPY (EGD) WITH PROPOFOL;  Surgeon: Molly Bali, MD;  [facility];  Service: Endoscopy;  Laterality: N/A;   ESOPHAGOGASTRODUODENOSCOPY (EGD) WITH PROPOFOL N/A 01/11/2023   Procedure: ESOPHAGOGASTRODUODENOSCOPY (EGD) WITH  PROPOFOL;  Surgeon: Molly Bal, DO;  [facility];  Service: Endoscopy;  Laterality: N/A;  9:45 am, asa 3   GIVENS CAPSULE STUDY N/A 06/15/2019   Procedure: GIVENS CAPSULE STUDY;  Surgeon: Molly Bali, MD;  [facility];  Service: Endoscopy;  Laterality: N/A;  7:30am   HALLUX VALGUS CORRECTION     bilateral foot surgery for bone repairs-multiple   IR IMAGING GUIDED PORT INSERTION  08/19/2023   LOBECTOMY Right 10/15/2016   Procedure: RIGHT UPPER LOBECTOMY;  Surgeon: Molly Slot, MD;  Location: [facility] OR;  Service: Thoracic;  Laterality: Right;   LYMPH NODE DISSECTION Right 10/15/2016   Procedure: LYMPH NODE DISSECTION;  Surgeon: Molly Slot, MD;  Location: [facility] OR;  Service: Thoracic;  Laterality: Right;   POLYPECTOMY  05/28/2019   Procedure: POLYPECTOMY;  Surgeon: Molly Bali, MD;  [facility];  Service: Endoscopy;;   SAVORY DILATION N/A 05/28/2019   Procedure: SAVORY DILATION;  Surgeon: Molly Bali, MD;  [facility]: [facility];  Service: Endoscopy;  Laterality: N/A;  15/16/17   VAGINA RECONSTRUCTION SURGERY     tvt 2006- [place]   VIDEO ASSISTED THORACOSCOPY (VATS)/WEDGE RESECTION Right 10/15/2016   Procedure: RIGHT VIDEO ASSISTED THORACOSCOPY (VATS)/WEDGE RESECTION;  Surgeon: Molly Slot, MD;  Location: [facility];  Service: Thoracic;  Laterality: Right;    Social History:  reports that she has quit smoking. Her smoking use included cigarettes. She has a 32 pack-year smoking history. She has never been exposed to tobacco smoke. She has never used smokeless tobacco. She reports that she does not drink alcohol and does not use drugs.   Allergies  Allergen Reactions   Synthroid [Levothyroxine Sodium] Anaphylaxis   Hydrochlorothiazide     cramps   Iodine Rash    I get a skin rash sometimes if iodine applied to my skin.      Family History  Problem Relation Age of Onset   Asthma Other    Diabetes Other     Arthritis Other    Cancer Mother    Cancer Brother    Anesthesia problems Neg Hx    Hypotension Neg Hx    Malignant hyperthermia Neg Hx    Pseudochol deficiency Neg Hx    Colon polyps Neg Hx    Colon cancer Neg Hx    Celiac disease Neg Hx    Pancreatic cancer Neg Hx    Stomach cancer Neg Hx    Ulcerative colitis Neg Hx    Crohn's disease Neg Hx      Prior to Admission medications   Medication Sig Start Date End Date Taking? Authorizing Provider  ACCU-CHEK AVIVA PLUS test strip USE TO MONITOR GLUCOSE LEVELS TWICE A DAY E11.9 11/27/21   Romero Belling, MD  ACCU-CHEK SOFTCLIX LANCETS lancets Use to monitor glucose levels BID; E11.9 Patient taking differently: 1 each by Other route 2 (two) times daily. E11.9 08/21/18   Romero Belling, MD  acetaminophen (TYLENOL) 500 MG tablet Take 1 tablet (  500 mg total) by mouth every 6 (six) hours as needed. Patient taking differently: Take 500 mg by mouth every 6 (six) hours as needed (for pain). 08/07/17   Horton, Mayer Masker, MD  APIXABAN Everlene Balls) VTE STARTER PACK (10MG  AND 5MG ) Take as directed on package: start with two-5mg  tablets twice daily for 7 days. On day 8, switch to one-5mg  tablet twice daily. Patient not taking: Reported on 10/23/2023 10/22/23   Si Gaul, MD  atorvastatin (LIPITOR) 20 MG tablet Take 20 mg by mouth daily. 04/27/17   [provider]  Blood Glucose Calibration (GLUCOSE CONTROL) SOLN 1 Bottle by In Vitro route as needed. Use to calibrate Accu-Chek Aviva Plus device prn; please provide control solution compatible with Accu-Chek Aviva Plus device. Patient taking differently: 1 each by Other route as needed (Use to calibrate glucometer). E11.9 08/21/18   Romero Belling, MD  Blood Glucose Monitoring Suppl (ACCU-CHEK AVIVA PLUS) w/Device KIT 1 each by Does not apply route 2 (two) times daily. Use to monitor glucose levels BID; E11.9 Patient taking differently: 1 each by Does not apply route 2 (two) times daily. E11.9 08/19/18    Romero Belling, MD  chlorthalidone (HYGROTON) 25 MG tablet Take 12.5 mg by mouth daily.  11/26/16   [provider]  clobetasol cream (TEMOVATE) 0.05 % Apply 1 Application topically 2 (two) times daily as needed (itching).    [provider]  cyclobenzaprine (FLEXERIL) 10 MG tablet TAKE 1 TABLET BY MOUTH TWICE A DAY AS NEEDED FOR MUSCLE SPASMS    [provider]  diclofenac Sodium (VOLTAREN ARTHRITIS PAIN) 1 % GEL Apply 4 g topically 4 (four) times daily. Patient taking differently: Apply 4 g topically 3 (three) times daily as needed (joint pain). 05/15/22   Leath-Warren, Sadie Haber, NP  escitalopram (LEXAPRO) 10 MG tablet Take 1 tablet (10 mg total) by mouth every morning. 10/25/23   Myrlene Broker, MD  folic acid (FOLVITE) 1 MG tablet Take 1 tablet (1 mg total) by mouth daily. Start 7 days before pemetrexed chemotherapy. Continue until 21 days after pemetrexed completed. 08/06/23   Si Gaul, MD  HYDROcodone bit-homatropine [facility]) 5-1.5 MG/5ML syrup Take 5 mLs by mouth every 6 (six) hours as needed for cough. Patient not taking: Reported on 10/23/2023 10/02/23   Si Gaul, MD  ibuprofen (ADVIL) 800 MG tablet Take 800 mg by mouth every 8 (eight) hours as needed for moderate pain.    [provider]  insulin glargine (LANTUS SOLOSTAR) 100 UNIT/ML Solostar Pen Inject 15 Units into the skin at bedtime. Patient not taking: Reported on 10/23/2023 06/25/23   Dani Gobble, NP  Insulin Pen Needle (B-D ULTRAFINE III SHORT PEN) 31G X 8 MM MISC USE AS DIRECTED 01/17/23   Dani Gobble, NP  ipratropium (ATROVENT) 0.03 % nasal spray Place 2 sprays into both nostrils 2 (two) times daily. 08/27/23   [provider]  lidocaine-prilocaine (EMLA) cream Apply to affected area once 08/06/23   Si Gaul, MD  lisinopril (ZESTRIL) 10 MG tablet Take 10 mg by mouth daily. 05/30/20   [provider]  meloxicam (MOBIC) 7.5 MG tablet Take 7.5 mg by  mouth daily. 07/10/23   [provider]  memantine (NAMENDA) 10 MG tablet Take 1 tablet (10 mg total) by mouth 2 (two) times daily. Take through 01-20-24, then stop. 10/18/23   Lonie Peak, MD  ondansetron (ZOFRAN) 8 MG tablet Take 1 tablet (8 mg total) by mouth every 8 (eight) hours as needed  for nausea or vomiting. Start on the third day after carboplatin. 08/06/23   Marlene Simas, MD  pantoprazole (PROTONIX) 40 MG tablet Take 1 tablet (40 mg total) by mouth 2 (two) times daily. 01/11/23 01/11/24  Carver, Charles K, DO  prochlorperazine (COMPAZINE) 10 MG tablet Take 1 tablet (10 mg total) by mouth every 6 (six) hours as needed for nausea or vomiting. 08/06/23   Marlene Simas, MD  traZODone (DESYREL) 100 MG tablet Take 2 tablets (200 mg total) by mouth at bedtime. 10/25/23   Alysia Bachelor, MD  triamcinolone cream (KENALOG) 0.1 % Apply 1 application topically 2 (two) times daily. Patient taking differently: Apply 1 application  topically 2 (two) times daily as needed (irritation). 03/30/21   Clover Dao, PA-C    Physical Exam: BP (!) 123/57   Pulse (!) 50   Temp 97.9 F (36.6 C) (Oral)   Resp 18   Ht 5\' 7"  (1.702 m)   Wt 75.8 kg   SpO2 98%   BMI 26.17 kg/m   General: 68 y.o. year-old female well developed well nourished in no acute distress.  Alert and oriented x3. HEENT: NCAT, EOMI Neck: Supple, trachea medial Cardiovascular: Regular rate and rhythm with no rubs or gallops.  No thyromegaly or JVD noted.  No lower extremity edema. 2/4 pulses in all 4 extremities. Respiratory: Clear to auscultation with no wheezes or rales. Good inspiratory effort. Abdomen: Soft, nontender nondistended with normal bowel sounds x4 quadrants. Muskuloskeletal: Tender to palpation of right-sided lower back (L1-L2).  No cyanosis, clubbing or edema noted bilaterally Neuro: CN II-XII intact, strength 5/5 x 4, sensation, reflexes intact Skin: No ulcerative lesions noted or rashes Psychiatry:  Judgement and insight appear normal. Mood is appropriate for condition and setting          Labs on Admission:  Basic Metabolic Panel: Recent Labs  Lab 11/06/23 1429 11/12/23 2251  NA 135 135  K 4.1 3.5  CL 97* 100  CO2 29 24  GLUCOSE 174* 110*  BUN 18 15  CREATININE 1.09* 1.14*  CALCIUM 8.9 8.8*   Liver Function Tests: Recent Labs  Lab 11/06/23 1429 11/12/23 2251  AST 26 29  ALT 18 16  ALKPHOS 66 67  BILITOT 0.8 0.4  PROT 6.4* 6.6  ALBUMIN 3.5 3.4*   No results for input(s): "LIPASE", "AMYLASE" in the last 168 hours. No results for input(s): "AMMONIA" in the last 168 hours. CBC: Recent Labs  Lab 11/06/23 1429 11/12/23 2251  WBC 2.7* 2.1*  NEUTROABS 2.0 0.6*  HGB 8.2* 7.1*  HCT 23.3* 21.1*  MCV 78.7* 81.2  PLT 181 26*   Cardiac Enzymes: No results for input(s): "CKTOTAL", "CKMB", "CKMBINDEX", "TROPONINI" in the last 168 hours.  BNP (last 3 results) No results for input(s): "BNP" in the last 8760 hours.  ProBNP (last 3 results) No results for input(s): "PROBNP" in the last 8760 hours.  CBG: No results for input(s): "GLUCAP" in the last 168 hours.  Radiological Exams on Admission: CT Lumbar Spine Wo Contrast Result Date: 11/12/2023 CLINICAL DATA:  Back trauma, no prior imaging (Age >= 16y) h/o cancer with fall, metastatic lung cancer EXAM: CT LUMBAR SPINE WITHOUT CONTRAST TECHNIQUE: Multidetector CT imaging of the lumbar spine was performed without intravenous contrast administration. Multiplanar CT image reconstructions were also generated. RADIATION DOSE REDUCTION: This exam was performed according to the departmental dose-optimization program which includes automated exposure control, adjustment of the mA and/or kV according to patient size and/or use of iterative reconstruction  technique. COMPARISON:  10/22/2023 FINDINGS: Segmentation: 5 lumbar type vertebrae. Alignment: Normal. Vertebrae: There is an acute minimally displaced fracture of the right L1  transverse process. No acute fracture identified. Vertebral body height is preserved. Modic changes noted within the adjoining L2-3 endplates. No suspicious lytic or blastic bone lesion. Paraspinal and other soft tissues: Negative. Disc levels: There is intervertebral disc space narrowing throughout the lumbar spine with associated vacuum disc phenomena at L2-3, L3-4, and L5-S1 in keeping with changes of moderate to severe degenerative disc disease, most severe at L2-3. Multifactorial central canal stenosis, severe at L2-3 and moderate at L1-2 and L3-4. Facet hypertrophy results in severe neuroforaminal narrowing on the left at L3-4 and L4-5 with probable abutment of the exiting L3-L4 nerve roots. Impingement of the left lateral recess at L3-4 and L4-5 also noted IMPRESSION: 1. Acute minimally displaced fracture of the right L1 transverse process. 2. No acute fracture or malalignment of the lumbar spine. 3. Multilevel degenerative disc disease and facet arthropathy resulting in multilevel central canal stenosis, severe at L2-3 and moderate at L1-2 and L3-4. 4. Severe neuroforaminal narrowing on the left at L3-4 and L4-5 with probable abutment of the exiting L3 and L4 nerve roots. If indicated, this could be better assessed with dedicated MRI examination Electronically Signed   By: Worthy Heads M.D.   On: 11/12/2023 23:45   CT Head Wo Contrast Result Date: 11/12/2023 CLINICAL DATA:  Head trauma, minor (Age >= 65y) h/o cancer having syncope EXAM: CT HEAD WITHOUT CONTRAST TECHNIQUE: Contiguous axial images were obtained from the base of the skull through the vertex without intravenous contrast. RADIATION DOSE REDUCTION: This exam was performed according to the departmental dose-optimization program which includes automated exposure control, adjustment of the mA and/or kV according to patient size and/or use of iterative reconstruction technique. COMPARISON:  10/07/2023 FINDINGS: Brain: Subtle areas of vasogenic  edema are identified within the frontal lobes bilaterally related to the numerous intra-axial metastatic foci noted on prior MRI examination. These are not visualized, however, on the current examination due to changes in modality. No acute intracranial hemorrhage. No abnormal mass effect or midline shift. No abnormal extra-axial fluid collection. Ventricular size is normal. Cerebellum is unremarkable. Vascular: No hyperdense vessel or unexpected calcification. Skull: Right temporal parosteal osteoma is unchanged. No focal lytic lesion. No acute fracture. Sinuses/Orbits: No acute finding. Other: Mastoid air cells and middle ear cavities are clear. IMPRESSION: 1. No acute intracranial hemorrhage. 2. Subtle areas of vasogenic edema are identified within the frontal lobes bilaterally related to the numerous intra-axial metastatic foci noted on prior MRI examination. These foci are not visualized, however, on the current examination due to changes in modality. Electronically Signed   By: Worthy Heads M.D.   On: 11/12/2023 23:31   DG HIP UNILAT WITH PELVIS 2-3 VIEWS RIGHT Result Date: 11/12/2023 CLINICAL DATA:  Fall EXAM: DG HIP (WITH OR WITHOUT PELVIS) 2-3V RIGHT COMPARISON:  None Available. FINDINGS: There is no evidence of hip fracture or dislocation. There is no evidence of arthropathy or other focal bone abnormality. IMPRESSION: Negative. Electronically Signed   By: Tyron Gallon M.D.   On: 11/12/2023 23:31    EKG: I independently viewed the EKG done and my findings are as followed: Normal sinus rhythm at rate of 62 bpm  Assessment/Plan Present on Admission:  Syncope  Essential hypertension  Mixed hyperlipidemia  GERD (gastroesophageal reflux disease)  Stage IV adenocarcinoma of lung, right (HCC)  Principal Problem:   Syncope Active Problems:  Essential hypertension   Mixed hyperlipidemia   Stage IV adenocarcinoma of lung, right (HCC)   GERD (gastroesophageal reflux disease)   Pancytopenia  (HCC)   Thrombocytopenia (HCC)   Type 2 diabetes mellitus with hyperglycemia (HCC)  Syncope Continue telemetry and watch for arrhythmias Troponins 15 > 14  EKG showed normal sinus rhythm at a rate of 62 bpm Echocardiogram will be done to rule out significant aortic stenosis or other outflow obstruction, and also to evaluate EF and to rule out segmental/Regional wall motion abnormalities.  Carotid artery Dopplers will be done to rule out hemodynamically significant stenosis  Pancytopenia possibly due to chemotherapy WBC 2.1, hemoglobin 7.1, platelets 26 Oncologist on-call (Dr. Alita Irwin) was consulted and recommended 1 unit of platelets transfusion due to patient's thrombocytopenia per ED PA  Essential hypertension Continue lisinopril  Mixed hyperlipidemia Continue Lipitor  GERD Continue Protonix  Type 2 diabetes mellitus Hemoglobin A1c on 10/23/2023 was 7.8 Continue ISS and hypoglycemia protocol  Stage IV adenocarcinoma of right lung Patient is on chemotherapy and follows with Dr. Liam Redhead Last office visit was on 10/31/23   DVT prophylaxis: SCDs  Code Status: Full code  Family Communication: None at bedside  Consults: None  Severity of Illness: The appropriate patient status for this patient is INPATIENT. Inpatient status is judged to be reasonable and necessary in order to provide the required intensity of service to ensure the patient's safety. The patient's presenting symptoms, physical exam findings, and initial radiographic and laboratory data in the context of their chronic comorbidities is felt to place them at high risk for further clinical deterioration. Furthermore, it is not anticipated that the patient will be medically stable for discharge from the hospital within 2 midnights of admission.   * I certify that at the point of admission it is my clinical judgment that the patient will require inpatient hospital care spanning beyond 2 midnights from the point of  admission due to high intensity of service, high risk for further deterioration and high frequency of surveillance required.*  Author: Judine Arciniega, DO 11/13/2023 4:22 AM  For on call review [email or web address].

## 2023-11-14 DIAGNOSIS — D649 Anemia, unspecified: Secondary | ICD-10-CM

## 2023-11-14 DIAGNOSIS — R55 Syncope and collapse: Secondary | ICD-10-CM | POA: Diagnosis not present

## 2023-11-14 LAB — TYPE AND SCREEN
ABO/RH(D): A NEG
Antibody Screen: NEGATIVE
Unit division: 0
Unit division: 0

## 2023-11-14 LAB — CBC
HCT: 26.1 % — ABNORMAL LOW (ref 36.0–46.0)
Hemoglobin: 9 g/dL — ABNORMAL LOW (ref 12.0–15.0)
MCH: 28.1 pg (ref 26.0–34.0)
MCHC: 34.5 g/dL (ref 30.0–36.0)
MCV: 81.6 fL (ref 80.0–100.0)
Platelets: 36 10*3/uL — ABNORMAL LOW (ref 150–400)
RBC: 3.2 MIL/uL — ABNORMAL LOW (ref 3.87–5.11)
RDW: 17.1 % — ABNORMAL HIGH (ref 11.5–15.5)
WBC: 1.8 10*3/uL — ABNORMAL LOW (ref 4.0–10.5)
nRBC: 0 % (ref 0.0–0.2)

## 2023-11-14 LAB — GLUCOSE, CAPILLARY
Glucose-Capillary: 98 mg/dL (ref 70–99)
Glucose-Capillary: 199 mg/dL — ABNORMAL HIGH (ref 70–99)

## 2023-11-14 LAB — MISC LABCORP TEST (SEND OUT): Labcorp test code: 83935

## 2023-11-14 LAB — BPAM RBC
Blood Product Expiration Date: 202504192359
Blood Product Expiration Date: 202505122359
ISSUE DATE / TIME: 202504160752
ISSUE DATE / TIME: 202504161147
Unit Type and Rh: 600
Unit Type and Rh: 9500

## 2023-11-14 LAB — IRON AND TIBC
Iron: 201 ug/dL — ABNORMAL HIGH (ref 28–170)
Saturation Ratios: 81 % — ABNORMAL HIGH (ref 10.4–31.8)
TIBC: 249 ug/dL — ABNORMAL LOW (ref 250–450)
UIBC: 48 ug/dL

## 2023-11-14 LAB — BPAM PLATELET PHERESIS
Blood Product Expiration Date: 202504192359
ISSUE DATE / TIME: 202504160349
Unit Type and Rh: 6200

## 2023-11-14 LAB — PREPARE PLATELET PHERESIS: Unit division: 0

## 2023-11-14 LAB — URINE CULTURE: Culture: NO GROWTH

## 2023-11-14 MED ORDER — CHLORHEXIDINE GLUCONATE CLOTH 2 % EX PADS
6.0000 | MEDICATED_PAD | Freq: Every day | CUTANEOUS | Status: DC
  Administered 2023-11-14: 6 via TOPICAL

## 2023-11-14 MED ORDER — FERROUS SULFATE 325 (65 FE) MG PO TABS: 325.0000 mg | ORAL_TABLET | Freq: Two times a day (BID) | ORAL | Status: DC

## 2023-11-14 MED ORDER — SODIUM CHLORIDE 0.9 % IV SOLN
200.0000 mg | Freq: Once | INTRAVENOUS | Status: DC
  Filled 2023-11-14: qty 10

## 2023-11-14 MED ORDER — FERROUS SULFATE 325 (65 FE) MG PO TABS
325.0000 mg | ORAL_TABLET | Freq: Two times a day (BID) | ORAL | Status: DC
  Administered 2023-11-14: 325 mg via ORAL
  Filled 2023-11-14: qty 1

## 2023-11-14 MED ORDER — FERROUS SULFATE 325 (65 FE) MG PO TABS: 325.0000 mg | ORAL_TABLET | Freq: Two times a day (BID) | ORAL | 0 refills | Status: DC

## 2023-11-14 MED ORDER — HYDROCODONE BIT-HOMATROP MBR 5-1.5 MG/5ML PO SOLN: 5.0000 mL | Freq: Four times a day (QID) | ORAL | 0 refills | Status: DC | PRN

## 2023-11-14 NOTE — Progress Notes (Signed)
 Went over discharge instructions w/ pt.

## 2023-11-14 NOTE — Plan of Care (Signed)

## 2023-11-14 NOTE — Plan of Care (Signed)

## 2023-11-14 NOTE — Discharge Summary (Addendum)
 Physician Discharge Summary   Patient: [name] MRN: [id number] DOB: 07-Jun-1956  Admit date:     11/12/2023  Discharge date: 11/14/23  Discharge Physician: Kendell Bane   PCP: Darrow Bussing, MD   Recommendations at discharge:    Follow-up with primary oncologist Dr. Arbutus Ped 1-2 weeks, CBC within 1 week report to PCP and Dr. Cheral Marker monitor platelets and hemoglobin level closely Due to low iron level, iron supplement started Recommending to hold Eliquis for next few days Follow-up with PCP in 1 week-note to PCP, holding BP meds due to soft blood pressure secondary to hypovolemia, anemia-May be restarted slowly as an outpatient On this admission received 2U PRBC, 1 unit platelets, and IV iron  Discharge Diagnoses: Principal Problem:   Syncope Active Problems:   Essential hypertension   Mixed hyperlipidemia   Stage IV adenocarcinoma of lung, right (HCC)   GERD (gastroesophageal reflux disease)   Pancytopenia (HCC)   Thrombocytopenia (HCC)   Type 2 diabetes mellitus with hyperglycemia (HCC)  Resolved Problems:   * No resolved hospital problems. *  Hospital Course: [name] is a 68 y.o. female with medical history significant of stage IV adenocarcinoma of right lung, type 2 diabetes mellitus, hypertension, GERD, hyperlipidemia who presents to the emergency department due to right hip/lower back pain.  She states that she went to the kitchen to get some coffee and on reaching to the cabinet to get a cup, she believes she must of passed out, because the next thing she remembered was sitting on the floor of the kitchen.  She was able to get up from the floor, but she started to have a right hip/right lower back pain that progressively worsened as the day progressed, so she decided to go to the ED for further evaluation and management.  She denies headache, neck pain, lightheadedness, numbness or tingling of extremities.   ED Course: Her Pulse was 50/min, other  vital signs were within normal range.  Workup in the ED showed pancytopenia with platelets of 26, WBC 2.1, hemoglobin 7.1.  BMP was normal except for blood glucose of 110, creatinine 1.14.  Urinalysis was normal. CT lumbar spine without contrast showed acute minimally displaced fracture of the right L1 transverse process.  No acute fracture or malalignment of the lumbar spine. CT head without contrast showed No acute intracranial hemorrhage Right hip x-ray was normal. Tylenol was given.  Hospitalist was asked to admit patient for further evaluation and management.      Syncope - Likely due to hypovolemia, dehydration, severe anemia -Blood pressure medications have been discontinued for now, may be reinitiated once patient is more stable on outpatient setting Continue telemetry and watch for arrhythmias Troponins 15 > 14  - Hemoglobin as low as 6.4, transfusing 2U PRBC -Neurochecks-no focal neurological findings  EKG showed normal sinus rhythm at a rate of 62 bpm -Echocardiogram -reviewed within normal limits   Pancytopenia possibly due to chemotherapy WBC 2.1, hemoglobin 6.4 >>> , platelets 26, 36 -11/13/23  2U PRBC + 1U platelet transfusion Oncologist on-call (Dr. Cherly Hensen) was consulted and recommended platelets transfusion due to patient's thrombocytopenia per ED PA    Latest Ref Rng & Units 11/14/2023    8:08 AM 11/13/2023    6:44 AM 11/13/2023    5:10 AM  CBC  WBC 4.0 - 10.5 K/uL 1.8   1.6   Hemoglobin 12.0 - 15.0 g/dL 9.0  6.4  5.8   Hematocrit 36.0 - 46.0 % 26.1  18.7  16.8   Platelets 150 - 400 K/uL 36   30       Essential hypertension HOLD lisinopril for now -follow-up as an outpatient   Mixed hyperlipidemia - Continue Lipitor   GERD - Continue Protonix   Type 2 diabetes mellitus -Diabetic diet, resume home regimen   Stage IV Adenocarcinoma of right lung Patient is on chemotherapy and follows with Dr. Shirline Frees Last office visit was on 10/31/23       Consultants:  Remote consultation hematologist Procedures performed: Blood and platelet transfusion, large SVT Disposition: Home Diet recommendation:  Discharge Diet Orders (From admission, onward)     Start     Ordered   11/14/23 0000  Diet - low sodium heart healthy        11/14/23 1007           Carb modified diet DISCHARGE MEDICATION: Allergies as of 11/14/2023       Reactions   Synthroid [levothyroxine Sodium] Anaphylaxis   Hydrochlorothiazide    cramps   Iodine Rash   I get a skin rash sometimes if iodine applied to my skin.         Medication List     PAUSE taking these medications    Apixaban Starter Pack (10mg  and 5mg ) Wait to take this until: November 16, 2023 Commonly known as: ELIQUIS STARTER PACK Take as directed on package: start with two-5mg  tablets twice daily for 7 days. On day 8, switch to one-5mg  tablet twice daily.       STOP taking these medications    chlorthalidone 25 MG tablet Commonly known as: HYGROTON   lisinopril 10 MG tablet Commonly known as: ZESTRIL   meloxicam 7.5 MG tablet Commonly known as: MOBIC       TAKE these medications    Accu-Chek Aviva Plus test strip Generic drug: glucose blood USE TO MONITOR GLUCOSE LEVELS TWICE A DAY E11.9   Accu-Chek Aviva Plus w/Device Kit 1 each by Does not apply route 2 (two) times daily. Use to monitor glucose levels BID; E11.9   Accu-Chek Softclix Lancets lancets Use to monitor glucose levels BID; E11.9   acetaminophen 500 MG tablet Commonly known as: TYLENOL Take 1 tablet (500 mg total) by mouth every 6 (six) hours as needed. What changed: reasons to take this   atorvastatin 20 MG tablet Commonly known as: LIPITOR Take 20 mg by mouth daily.   B-D ULTRAFINE III SHORT PEN 31G X 8 MM Misc Generic drug: Insulin Pen Needle USE AS DIRECTED   clobetasol cream 0.05 % Commonly known as: TEMOVATE Apply 1 Application topically 2 (two) times daily as needed (itching).   clotrimazole 10 MG  troche Commonly known as: MYCELEX Take 10 mg by mouth 5 (five) times daily.   cyclobenzaprine 10 MG tablet Commonly known as: FLEXERIL TAKE 1 TABLET BY MOUTH TWICE A DAY AS NEEDED FOR MUSCLE SPASMS   diclofenac Sodium 1 % Gel Commonly known as: Voltaren Arthritis Pain Apply 4 g topically 4 (four) times daily. What changed:  when to take this reasons to take this   escitalopram 10 MG tablet Commonly known as: LEXAPRO Take 1 tablet (10 mg total) by mouth every morning.   ferrous sulfate 325 (65 FE) MG tablet Take 1 tablet (325 mg total) by mouth 2 (two) times daily with a meal. Start taking on: November 15, 2023   folic acid 1 MG tablet Commonly known as: FOLVITE Take 1 tablet (1 mg total) by mouth daily. Start 7 days  before pemetrexed chemotherapy. Continue until 21 days after pemetrexed completed.   Glucose Control Soln 1 Bottle by In Vitro route as needed. Use to calibrate Accu-Chek Aviva Plus device prn; please provide control solution compatible with Accu-Chek Aviva Plus device.   HYDROcodone bit-homatropine 5-1.5 MG/5ML syrup Commonly known as: Hycodan Take 5 mLs by mouth every 6 (six) hours as needed for cough.   ipratropium 0.03 % nasal spray Commonly known as: ATROVENT Place 2 sprays into both nostrils 2 (two) times daily.   Lantus SoloStar 100 UNIT/ML Solostar Pen Generic drug: insulin glargine Inject 15 Units into the skin at bedtime.   lidocaine 2 % solution Commonly known as: XYLOCAINE SMARTSIG:By Mouth   lidocaine-prilocaine cream Commonly known as: EMLA Apply to affected area once   magic mouthwash Soln Take 5 mLs by mouth 3 (three) times daily as needed for mouth pain. Suspension contains equal amounts of Maalox Extra Strength, nystatin, and diphenhydramine.   memantine 10 MG tablet Commonly known as: NAMENDA Take 1 tablet (10 mg total) by mouth 2 (two) times daily. Take through 01-20-24, then stop.   ondansetron 8 MG tablet Commonly known as:  Zofran Take 1 tablet (8 mg total) by mouth every 8 (eight) hours as needed for nausea or vomiting. Start on the third day after carboplatin.   pantoprazole 40 MG tablet Commonly known as: PROTONIX Take 1 tablet (40 mg total) by mouth 2 (two) times daily.   prochlorperazine 10 MG tablet Commonly known as: COMPAZINE Take 1 tablet (10 mg total) by mouth every 6 (six) hours as needed for nausea or vomiting.   traZODone 100 MG tablet Commonly known as: DESYREL Take 2 tablets (200 mg total) by mouth at bedtime.   triamcinolone cream 0.1 % Commonly known as: KENALOG Apply 1 application topically 2 (two) times daily. What changed:  when to take this reasons to take this         Discharge Exam: Filed Weights   11/12/23 1809  Weight: 75.8 kg        General:  AAO x 3,  cooperative, no distress;   HEENT:  Normocephalic, PERRL, otherwise with in Normal limits   Neuro:  CNII-XII intact. , normal motor and sensation, reflexes intact   Lungs:   Clear to auscultation BL, Respirations unlabored,  No wheezes / crackles  Cardio:    S1/S2, RRR, No murmure, No Rubs or Gallops   Abdomen:  Soft, non-tender, bowel sounds active all four quadrants, no guarding or peritoneal signs.  Muscular  skeletal:  Limited exam -global generalized weaknesses - in bed, able to move all 4 extremities,   2+ pulses,  symmetric, No pitting edema  Skin:  Dry, warm to touch, negative for any Rashes,  Wounds: Please see nursing documentation          Condition at discharge: good  The results of significant diagnostics from this hospitalization (including imaging, microbiology, ancillary and laboratory) are listed below for reference.   Imaging Studies: ECHOCARDIOGRAM COMPLETE Result Date: 11/13/2023    ECHOCARDIOGRAM REPORT   Patient Name:   [name] Date of Exam: 11/13/2023 Medical Rec #:  [id number]        Height:       67.0 in Accession #:    [id number]       Weight:       167.1 lb Date of  Birth:  [birth year]        BSA:          1.874  m Patient Age:    67 years         BP:           139/85 mmHg Patient Gender: F                HR:           79 bpm. Exam Location:  [facility] Procedure: 2D Echo, Cardiac Doppler and Color Doppler (Both Spectral and Color            Flow Doppler were utilized during procedure). Indications:    Syncope R55  History:        Patient has no prior history of Echocardiogram examinations.                 Signs/Symptoms:Syncope; Risk Factors:Hypertension, Diabetes and                 Dyslipidemia. Stage IV adenocarcinoma of lung, right [facility]),                 Metastatic cancer to brain [facility]).  Sonographer:    Denese Finn RCS Referring Phys: [id number] OLADAPO ADEFESO IMPRESSIONS  1. Left ventricular ejection fraction, by estimation, is 55 to 60%. The left ventricle has normal function. The left ventricle has no regional wall motion abnormalities. Left ventricular diastolic parameters were normal.  2. Right ventricular systolic function is normal. The right ventricular size is normal. There is normal pulmonary artery systolic pressure. The estimated right ventricular systolic pressure is 20.3 mmHg.  3. The mitral valve is degenerative. Trivial mitral valve regurgitation.  4. The aortic valve is tricuspid. There is mild calcification of the aortic valve. Aortic valve regurgitation is mild. Aortic valve sclerosis/calcification is present, without any evidence of aortic stenosis.  5. The inferior vena cava is normal in size with greater than 50% respiratory variability, suggesting right atrial pressure of 3 mmHg. Comparison(s): No prior Echocardiogram. FINDINGS  Left Ventricle: Left ventricular ejection fraction, by estimation, is 55 to 60%. The left ventricle has normal function. The left ventricle has no regional wall motion abnormalities. The left ventricular internal cavity size was normal in size. There is  borderline left ventricular hypertrophy. Left ventricular diastolic  parameters were normal. Right Ventricle: The right ventricular size is normal. No increase in right ventricular wall thickness. Right ventricular systolic function is normal. There is normal pulmonary artery systolic pressure. The tricuspid regurgitant velocity is 2.08 m/s, and  with an assumed right atrial pressure of 3 mmHg, the estimated right ventricular systolic pressure is 20.3 mmHg. Left Atrium: Left atrial size was normal in size. Right Atrium: Right atrial size was normal in size. Pericardium: There is no evidence of pericardial effusion. Mitral Valve: The mitral valve is degenerative in appearance. Mild mitral annular calcification. Trivial mitral valve regurgitation. Tricuspid Valve: The tricuspid valve is grossly normal. Tricuspid valve regurgitation is mild. Aortic Valve: The aortic valve is tricuspid. There is mild calcification of the aortic valve. There is mild to moderate aortic valve annular calcification. Aortic valve regurgitation is mild. Aortic regurgitation PHT measures 781 msec. Aortic valve sclerosis/calcification is present, without any evidence of aortic stenosis. Pulmonic Valve: The pulmonic valve was grossly normal. Pulmonic valve regurgitation is trivial. Aorta: The aortic root is normal in size and structure. Venous: The inferior vena cava is normal in size with greater than 50% respiratory variability, suggesting right atrial pressure of 3 mmHg. IAS/Shunts: No atrial level shunt detected by color flow Doppler. Additional Comments: 3D was performed not  requiring image post processing on an independent workstation and was indeterminate.  LEFT VENTRICLE PLAX 2D LVIDd:         5.10 cm   Diastology LVIDs:         3.40 cm   LV e' medial:    7.72 cm/s LV PW:         0.90 cm   LV E/e' medial:  12.5 LV IVS:        1.00 cm   LV e' lateral:   9.68 cm/s LVOT diam:     2.00 cm   LV E/e' lateral: 10.0 LV SV:         91 LV SV Index:   48 LVOT Area:     3.14 cm  RIGHT VENTRICLE RV S prime:     11.50  cm/s TAPSE (M-mode): 2.6 cm LEFT ATRIUM             Index        RIGHT ATRIUM           Index LA diam:        3.90 cm 2.08 cm/m   RA Area:     20.40 cm LA Vol (A2C):   52.9 ml 28.23 ml/m  RA Volume:   57.10 ml  30.48 ml/m LA Vol (A4C):   55.7 ml 29.73 ml/m LA Biplane Vol: 57.2 ml 30.53 ml/m  AORTIC VALVE LVOT Vmax:   121.00 cm/s LVOT Vmean:  77.000 cm/s LVOT VTI:    0.289 m AI PHT:      781 msec  AORTA Ao Root diam: 3.20 cm MITRAL VALVE               TRICUSPID VALVE MV Area (PHT): 4.15 cm    TR Peak grad:   17.3 mmHg MV Decel Time: 183 msec    TR Vmax:        208.00 cm/s MV E velocity: 96.40 cm/s MV A velocity: 94.30 cm/s  SHUNTS MV E/A ratio:  1.02        Systemic VTI:  0.29 m                            Systemic Diam: 2.00 cm Nona Dell MD Electronically signed by Nona Dell MD Signature Date/Time: 11/13/2023/3:41:13 PM    Final    US Carotid Bilateral Result Date: 11/13/2023 CLINICAL DATA:  Syncope, thrombocytopenia, hypertension, hyperlipidemia, diabetes EXAM: BILATERAL CAROTID DUPLEX ULTRASOUND TECHNIQUE: Wallace Cullens scale imaging, color Doppler and duplex ultrasound were performed of bilateral carotid and vertebral arteries in the neck. COMPARISON:  None available FINDINGS: Criteria: Quantification of carotid stenosis is based on velocity parameters that correlate the residual internal carotid diameter with NASCET-based stenosis levels, using the diameter of the distal internal carotid lumen as the denominator for stenosis measurement. The following velocity measurements were obtained: RIGHT ICA: 109/36 cm/sec CCA: 69/14 cm/sec SYSTOLIC ICA/CCA RATIO:  1.6 ECA: 77 cm/sec LEFT ICA: 143/42 cm/sec CCA: 74/14 cm/sec SYSTOLIC ICA/CCA RATIO:  1.9 ECA: 63 cm/sec RIGHT CAROTID ARTERY: Mild heterogeneous plaque of the right ICA origin. RIGHT VERTEBRAL ARTERY:  Antegrade flow. LEFT CAROTID ARTERY: Mildly elevated peak systolic and end-diastolic velocities of the distal left internal carotid artery are likely  artifact given lack of significant atheromatous plaque. LEFT VERTEBRAL ARTERY:  Antegrade flow. IMPRESSION: 1. Mild atheromatous plaque of the right ICA origin with Doppler measurements consistent with less than 50% stenosis. 2. Less than 50% stenosis of the  left internal carotid artery. Electronically Signed   By: Elester Grim M.D.   On: 11/13/2023 11:34   CT Lumbar Spine Wo Contrast Result Date: 11/12/2023 CLINICAL DATA:  Back trauma, no prior imaging (Age >= 16y) h/o cancer with fall, metastatic lung cancer EXAM: CT LUMBAR SPINE WITHOUT CONTRAST TECHNIQUE: Multidetector CT imaging of the lumbar spine was performed without intravenous contrast administration. Multiplanar CT image reconstructions were also generated. RADIATION DOSE REDUCTION: This exam was performed according to the departmental dose-optimization program which includes automated exposure control, adjustment of the mA and/or kV according to patient size and/or use of iterative reconstruction technique. COMPARISON:  10/22/2023 FINDINGS: Segmentation: 5 lumbar type vertebrae. Alignment: Normal. Vertebrae: There is an acute minimally displaced fracture of the right L1 transverse process. No acute fracture identified. Vertebral body height is preserved. Modic changes noted within the adjoining L2-3 endplates. No suspicious lytic or blastic bone lesion. Paraspinal and other soft tissues: Negative. Disc levels: There is intervertebral disc space narrowing throughout the lumbar spine with associated vacuum disc phenomena at L2-3, L3-4, and L5-S1 in keeping with changes of moderate to severe degenerative disc disease, most severe at L2-3. Multifactorial central canal stenosis, severe at L2-3 and moderate at L1-2 and L3-4. Facet hypertrophy results in severe neuroforaminal narrowing on the left at L3-4 and L4-5 with probable abutment of the exiting L3-L4 nerve roots. Impingement of the left lateral recess at L3-4 and L4-5 also noted IMPRESSION: 1. Acute  minimally displaced fracture of the right L1 transverse process. 2. No acute fracture or malalignment of the lumbar spine. 3. Multilevel degenerative disc disease and facet arthropathy resulting in multilevel central canal stenosis, severe at L2-3 and moderate at L1-2 and L3-4. 4. Severe neuroforaminal narrowing on the left at L3-4 and L4-5 with probable abutment of the exiting L3 and L4 nerve roots. If indicated, this could be better assessed with dedicated MRI examination Electronically Signed   By: Worthy Heads M.D.   On: 11/12/2023 23:45   CT Head Wo Contrast Result Date: 11/12/2023 CLINICAL DATA:  Head trauma, minor (Age >= 65y) h/o cancer having syncope EXAM: CT HEAD WITHOUT CONTRAST TECHNIQUE: Contiguous axial images were obtained from the base of the skull through the vertex without intravenous contrast. RADIATION DOSE REDUCTION: This exam was performed according to the departmental dose-optimization program which includes automated exposure control, adjustment of the mA and/or kV according to patient size and/or use of iterative reconstruction technique. COMPARISON:  10/07/2023 FINDINGS: Brain: Subtle areas of vasogenic edema are identified within the frontal lobes bilaterally related to the numerous intra-axial metastatic foci noted on prior MRI examination. These are not visualized, however, on the current examination due to changes in modality. No acute intracranial hemorrhage. No abnormal mass effect or midline shift. No abnormal extra-axial fluid collection. Ventricular size is normal. Cerebellum is unremarkable. Vascular: No hyperdense vessel or unexpected calcification. Skull: Right temporal parosteal osteoma is unchanged. No focal lytic lesion. No acute fracture. Sinuses/Orbits: No acute finding. Other: Mastoid air cells and middle ear cavities are clear. IMPRESSION: 1. No acute intracranial hemorrhage. 2. Subtle areas of vasogenic edema are identified within the frontal lobes bilaterally  related to the numerous intra-axial metastatic foci noted on prior MRI examination. These foci are not visualized, however, on the current examination due to changes in modality. Electronically Signed   By: Worthy Heads M.D.   On: 11/12/2023 23:31   DG HIP UNILAT WITH PELVIS 2-3 VIEWS RIGHT Result Date: 11/12/2023 CLINICAL DATA:  Fall EXAM: DG  HIP (WITH OR WITHOUT PELVIS) 2-3V RIGHT COMPARISON:  None Available. FINDINGS: There is no evidence of hip fracture or dislocation. There is no evidence of arthropathy or other focal bone abnormality. IMPRESSION: Negative. Electronically Signed   By: Tyron Gallon M.D.   On: 11/12/2023 23:31   CT CHEST ABDOMEN PELVIS W CONTRAST Result Date: 10/22/2023 CLINICAL DATA:  Staging non-small-cell lung cancer. Prior right upper lobectomy. * Tracking Code: BO * EXAM: CT CHEST, ABDOMEN, AND PELVIS WITH CONTRAST TECHNIQUE: Multidetector CT imaging of the chest, abdomen and pelvis was performed following the standard protocol during bolus administration of intravenous contrast. RADIATION DOSE REDUCTION: This exam was performed according to the departmental dose-optimization program which includes automated exposure control, adjustment of the mA and/or kV according to patient size and/or use of iterative reconstruction technique. CONTRAST:  100mL OMNIPAQUE IOHEXOL 300 MG/ML  SOLN COMPARISON:  CT chest dated 06/13/2023, nuclear medicine PET dated 07/16/2023 FINDINGS: CT CHEST FINDINGS Cardiovascular: Right chest wall port tip terminates at the right atrium. Normal heart size. No significant pericardial fluid/thickening. Great vessels are normal in course and caliber. Bilateral filling defects within lower lobar proximal segmental pulmonary arteries (2:32, 31). Coronary artery calcifications. Mediastinum/Nodes: Imaged thyroid gland without nodules meeting criteria for imaging follow-up by size. Normal esophagus. Decreased size of multi station lymphadenopathy, for example 6 mm left  supraclavicular (2:7), previously 17 mm, 13 mm right axillary (2:13), previously 22 mm, and 11 mm subcarinal (2:28), previously 20 mm. Lungs/Pleura: The central airways are patent. Postsurgical changes of right upper lobectomy. Interval improved right hilar soft tissue thickening with decreased effacement of right middle lobe bronchi. Unchanged 2 mm left upper lobe perifissural nodule (4:35). No pneumothorax. No pleural effusion. Musculoskeletal: Increased sclerosis of left T11 metastasis measuring 1.7 x 1.1 cm (2:48), previously 1.3 x 0.9 cm. Multilevel degenerative changes of the thoracic spine. CT ABDOMEN PELVIS FINDINGS Hepatobiliary: No focal hepatic lesions. No intra or extrahepatic biliary ductal dilation. Normal gallbladder. Pancreas: No focal lesions or main ductal dilation. Spleen: Diffusely heterogeneous appearance of the splenic parenchyma with focal hypoattenuating foci measuring 12 mm (2:55) and 10 mm (2:60) which appear to correspond to subtle areas of hypermetabolism on prior PET. Adrenals/Urinary Tract: Decreased size of bilateral adrenal nodules measuring 9 mm on the right and 12 mm on the left (2:57), previously 13 mm and 18 mm, respectively. No suspicious renal mass or hydronephrosis. Punctate nonobstructing left interpolar stone. Irregular bladder stone. Stomach/Bowel: Normal appearance of the stomach. No evidence of bowel wall thickening, distention, or inflammatory changes. Moderately distended rectum contains large volume stool. Colonic diverticulosis without acute diverticulitis. 7 mm hyperattenuating rounded focus along the anterolateral aspect of the ascending colon (2:81), most likely a diverticulum. Normal appendix. Vascular/Lymphatic: Aortic atherosclerosis. Slightly decreased size of left external iliac lymph node measuring 12 mm (2:99), previously 14 mm. Slightly increased prominence of a few subcentimeter retroperitoneal lymph nodes, for example 7 mm aortocaval (2:69), previously 5  mm and 6 mm left para-aortic (2:70), previously 4 mm, and 8 mm left common iliac lymph node (2:80), previously 5 mm. Reproductive: No adnexal masses. Other: No free fluid, fluid collection, or free air. Musculoskeletal: No acute or abnormal lytic or blastic osseous findings. Multilevel degenerative changes of the lumbar spine. Postsurgical changes of the anterior abdominal wall. IMPRESSION: 1. Bilateral filling defects within lower lobar proximal segmental pulmonary arteries, consistent with pulmonary emboli. No evidence of right heart strain. 2. Interval improved right hilar soft tissue thickening with decreased effacement of right middle lobe  bronchi. 3. Decreased size of multi station lymphadenopathy and bilateral adrenal metastases. 4. Increased sclerosis of left T11 metastasis, likely treatment related changes. 5. Diffusely heterogeneous appearance of the splenic parenchyma with focal hypoattenuating foci measuring 12 mm and 10 mm which appear to correspond to subtle areas of hypermetabolism on prior PET, suspicious for metastases. 6. Slightly increased prominence of a few subcentimeter retroperitoneal lymph nodes, which are nonspecific and may be reactive. Attention on follow-up. 7. Moderately distended rectum contains large volume stool. Subcentimeter hyperattenuating focus along the ascending colon, most likely a colonic diverticulum. Attention on follow-up. 8. Aortic Atherosclerosis (ICD10-I70.0). Coronary artery calcifications. Assessment for potential risk factor modification, dietary therapy or pharmacologic therapy may be warranted, if clinically indicated. Critical Value/emergent results were called by telephone at the time of interpretation on 10/22/2023 at 3:57 pm to provider [facility] , who verbally acknowledged these results. Electronically Signed   By: Limin  Xu M.D.   On: 10/22/2023 16:09    Microbiology: Results for orders placed or performed during the hospital encounter of 11/12/23   Urine Culture     Status: None   Collection Time: 11/12/23 10:48 PM   Specimen: Urine, Clean Catch  Result Value Ref Range Status   Specimen Description   Final    URINE, CLEAN CATCH Performed at [facility], [address], [place], [place] [address]    Special Requests   Final    NONE Performed at [facility], [address], [place], [place] [address]    Culture   Final    NO GROWTH Performed at [facility], [address], [place], [place] [address]    Report Status 11/14/2023 FINAL  Final    Labs: CBC: Recent Labs  Lab 11/12/23 2251 11/13/23 0510 11/13/23 0644 11/14/23 0808  WBC 2.1* 1.6*  --  1.8*  NEUTROABS 0.6*  --   --   --   HGB 7.1* 5.8* 6.4* 9.0*  HCT 21.1* 16.8* 18.7* 26.1*  MCV 81.2 80.8  --  81.6  PLT 26* 30*  --  36*   Basic Metabolic Panel: Recent Labs  Lab 11/12/23 2251 11/13/23 0510  NA 135 137  K 3.5 3.7  CL 100 103  CO2 24 28  GLUCOSE 110* 86  BUN 15 15  CREATININE 1.14* 1.06*  CALCIUM 8.8* 8.5*   Liver Function Tests: Recent Labs  Lab 11/12/23 2251 11/13/23 0510  AST 29 20  ALT 16 14  ALKPHOS 67 55  BILITOT 0.4 0.6  PROT 6.6 5.6*  ALBUMIN 3.4* 2.9*   CBG: Recent Labs  Lab 11/13/23 1247 11/13/23 1655 11/13/23 2100 11/14/23 0720 11/14/23 1124  GLUCAP 91 102* 205* 98 199*    Discharge time spent: greater than 30 minutes.  Signed: Bobbetta Burnet, MD [facility] 11/14/2023

## 2023-11-17 LAB — BPAM PLATELET PHERESIS
Blood Product Expiration Date: 202504202359
Unit Type and Rh: 6200

## 2023-11-17 LAB — PREPARE PLATELET PHERESIS: Unit division: 0

## 2023-11-19 ENCOUNTER — Inpatient Hospital Stay: Payer: Medicare Other

## 2023-11-20 ENCOUNTER — Other Ambulatory Visit: Payer: Self-pay | Admitting: Medical Oncology

## 2023-11-20 ENCOUNTER — Inpatient Hospital Stay

## 2023-11-20 ENCOUNTER — Inpatient Hospital Stay (HOSPITAL_BASED_OUTPATIENT_CLINIC_OR_DEPARTMENT_OTHER): Admitting: Internal Medicine

## 2023-11-20 ENCOUNTER — Other Ambulatory Visit

## 2023-11-20 ENCOUNTER — Inpatient Hospital Stay: Admitting: Dietician

## 2023-11-20 ENCOUNTER — Ambulatory Visit: Admitting: Internal Medicine

## 2023-11-20 ENCOUNTER — Encounter: Payer: Self-pay | Admitting: Internal Medicine

## 2023-11-20 ENCOUNTER — Other Ambulatory Visit: Payer: Self-pay | Admitting: Gastroenterology

## 2023-11-20 ENCOUNTER — Ambulatory Visit

## 2023-11-20 ENCOUNTER — Other Ambulatory Visit: Payer: Self-pay | Admitting: Physician Assistant

## 2023-11-20 ENCOUNTER — Encounter: Payer: Self-pay | Admitting: Medical Oncology

## 2023-11-20 VITALS — BP 114/55 | HR 75 | Resp 16

## 2023-11-20 VITALS — BP 123/62 | HR 63 | Temp 97.3°F | Resp 15 | Ht 67.0 in | Wt 164.1 lb

## 2023-11-20 DIAGNOSIS — Z5111 Encounter for antineoplastic chemotherapy: Secondary | ICD-10-CM | POA: Diagnosis not present

## 2023-11-20 DIAGNOSIS — C3491 Malignant neoplasm of unspecified part of right bronchus or lung: Secondary | ICD-10-CM

## 2023-11-20 DIAGNOSIS — D649 Anemia, unspecified: Secondary | ICD-10-CM

## 2023-11-20 DIAGNOSIS — K219 Gastro-esophageal reflux disease without esophagitis: Secondary | ICD-10-CM

## 2023-11-20 DIAGNOSIS — Z95828 Presence of other vascular implants and grafts: Secondary | ICD-10-CM

## 2023-11-20 LAB — CMP (CANCER CENTER ONLY)
ALT: 12 U/L (ref 0–44)
AST: 18 U/L (ref 15–41)
Albumin: 3.5 g/dL (ref 3.5–5.0)
Alkaline Phosphatase: 70 U/L (ref 38–126)
Anion gap: 8 (ref 5–15)
BUN: 13 mg/dL (ref 8–23)
CO2: 28 mmol/L (ref 22–32)
Calcium: 8.6 mg/dL — ABNORMAL LOW (ref 8.9–10.3)
Chloride: 103 mmol/L (ref 98–111)
Creatinine: 1.27 mg/dL — ABNORMAL HIGH (ref 0.44–1.00)
GFR, Estimated: 46 mL/min — ABNORMAL LOW (ref >60)
Glucose, Bld: 149 mg/dL — ABNORMAL HIGH (ref 70–99)
Potassium: 4 mmol/L (ref 3.5–5.1)
Sodium: 139 mmol/L (ref 135–145)
Total Bilirubin: 0.5 mg/dL (ref 0.0–1.2)
Total Protein: 6.1 g/dL — ABNORMAL LOW (ref 6.5–8.1)

## 2023-11-20 LAB — CBC WITH DIFFERENTIAL (CANCER CENTER ONLY)
Abs Immature Granulocytes: 0.15 10*3/uL — ABNORMAL HIGH (ref 0.00–0.07)
Basophils Absolute: 0 10*3/uL (ref 0.0–0.1)
Basophils Relative: 1 %
Eosinophils Absolute: 0 10*3/uL (ref 0.0–0.5)
Eosinophils Relative: 0 %
HCT: 22.2 % — ABNORMAL LOW (ref 36.0–46.0)
Hemoglobin: 7.7 g/dL — ABNORMAL LOW (ref 12.0–15.0)
Immature Granulocytes: 5 %
Lymphocytes Relative: 32 %
Lymphs Abs: 0.9 10*3/uL (ref 0.7–4.0)
MCH: 28.2 pg (ref 26.0–34.0)
MCHC: 34.7 g/dL (ref 30.0–36.0)
MCV: 81.3 fL (ref 80.0–100.0)
Monocytes Absolute: 0.8 10*3/uL (ref 0.1–1.0)
Monocytes Relative: 27 %
Neutro Abs: 1 10*3/uL — ABNORMAL LOW (ref 1.7–7.7)
Neutrophils Relative %: 35 %
Platelet Count: 196 10*3/uL (ref 150–400)
RBC: 2.73 MIL/uL — ABNORMAL LOW (ref 3.87–5.11)
RDW: 17 % — ABNORMAL HIGH (ref 11.5–15.5)
WBC Count: 2.9 10*3/uL — ABNORMAL LOW (ref 4.0–10.5)
nRBC: 0 % (ref 0.0–0.2)

## 2023-11-20 MED ORDER — SODIUM CHLORIDE 0.9% FLUSH
10.0000 mL | INTRAVENOUS | Status: DC | PRN
  Administered 2023-11-20: 10 mL

## 2023-11-20 MED ORDER — PROCHLORPERAZINE MALEATE 10 MG PO TABS
10.0000 mg | ORAL_TABLET | Freq: Once | ORAL | Status: AC
  Administered 2023-11-20: 10 mg via ORAL
  Filled 2023-11-20: qty 1

## 2023-11-20 MED ORDER — SODIUM CHLORIDE 0.9% FLUSH
10.0000 mL | Freq: Once | INTRAVENOUS | Status: AC
Start: 2023-11-20 — End: 2023-11-20
  Administered 2023-11-20: 10 mL

## 2023-11-20 MED ORDER — PEMETREXED DISODIUM CHEMO 500 MG/20ML IV SOLN
500.0000 mg/m2 | Freq: Once | INTRAVENOUS | Status: AC
  Administered 2023-11-20: 1000 mg via INTRAVENOUS
  Filled 2023-11-20: qty 40

## 2023-11-20 MED ORDER — SODIUM CHLORIDE 0.9 % IV SOLN
200.0000 mg | Freq: Once | INTRAVENOUS | Status: AC
  Administered 2023-11-20: 200 mg via INTRAVENOUS
  Filled 2023-11-20: qty 200

## 2023-11-20 MED ORDER — HEPARIN SOD (PORK) LOCK FLUSH 100 UNIT/ML IV SOLN
500.0000 [IU] | Freq: Once | INTRAVENOUS | Status: AC | PRN
  Administered 2023-11-20: 500 [IU]

## 2023-11-20 MED ORDER — SODIUM CHLORIDE 0.9 % IV SOLN: INTRAVENOUS | Status: DC

## 2023-11-20 NOTE — Progress Notes (Signed)
 Called [facility]. Spoke to [employer]. Blood transfusion ordered to be given at [facility].

## 2023-11-20 NOTE — Progress Notes (Signed)
 [facility] [phone]   [phone]  OFFICE PROGRESS NOTE  Molly Pinal, MD [address] [place] [address]  DIAGNOSIS:  1) recurrent/metastatic non-small cell lung cancer, adenocarcinoma presented with metastatic adenopathy in the thorax as well as mediastinum, axillary lymph nodes and potential local recurrence at the right hilum along the inferior margin of the resection staple line with bilateral hypermetabolic adrenal metastasis and hypermetabolic bone metastasis to the T11 vertebral body as well as brain metastasis diagnosed in December 2024. 2) incidental bilaterally pulmonary embolism diagnosed on CT scan of the chest on October 22, 2023 2) Stage IA (T1a, N0, M0) non-small cell lung cancer, adenocarcinoma presented with right upper lobe pulmonary nodule   Detected Alteration(s) / Biomarker(s) Associated FDA-approved therapies Clinical Trial Availability % cfDNA or Amplification KRAS G12C approved by FDA Adagrasib, Sotorasib approved in other indication Adagrasib+cetuximab Yes 2.8%  TP53 E204* None Yes 2.0%   PRIOR THERAPY:  1) S/P right VATS with right upper lobectomy and mediastinal lymph node dissection under the care of Dr. Luna Salinas on October 15, 2016. 2) whole brain irradiation under the care of of Dr. [name].  CURRENT THERAPY:  1) Systemic chemotherapy with carboplatin  for AUC of 5, Alimta 500 Mg/M2 and Keytruda  200 Mg IV every 3 weeks.  First dose August 20, 2022.  Status post 4  cycles. 2) Eliquis  starter kit with 10 mg p.o. twice daily for 7 days followed by 5 mg p.o. daily.  First dose started October 18, 2023.   INTERVAL HISTORY: [name] 68 y.o. female returns to the clinic today for annual follow-up visit accompanied by her neighbor.Discussed the use of AI scribe software for clinical note transcription with the patient, who gave verbal consent to proceed.  History of Present Illness    [name] is a 68 year old female with metastatic non-small cell lung cancer who presents for evaluation before starting cycle number five of chemotherapy.  Diagnosed with metastatic non-small cell lung cancer, adenocarcinoma, in December 2024, with a positive KRAS G12C mutation. She started systemic chemotherapy with carboplatin , Alimta, and Keytruda . She has completed four cycles and is here for evaluation before starting cycle number five.  Recently experienced a syncopal episode resulting in a fall, leading to a hospital visit where thrombocytopenia was discovered. She was unaware of the low platelet count until the hospital visit. She felt 'a little bit weird' for a few days prior to the fall. Her platelet count was 27 at the hospital, but it has since recovered to 196.  Has a history of anemia, which she states has been present 'forever.' During the hospital visit, her hemoglobin was found to be 5.8, lower than her usual baseline. Her hemoglobin today is 7.7. She has been started on iron  tablets following the hospital visit.  Used to donate blood regularly, with her hemoglobin running at 12, but it started dropping and did not recover, leading her to stop donating. She feels better this past week than she has in months, stating she 'felt like a new person' and even drove, which she had not done in a while.        MEDICAL HISTORY: Past Medical History:  Diagnosis Date   Adenocarcinoma of right lung, stage 1 (HCC) 11/29/2016   Cancer (HCC)    Chronic pain    Depression    takes Lexapro  daily   Diabetes mellitus    takes Metformin  daily and has an  insulin  pump.Fasting blood sugar runs250   GERD (gastroesophageal reflux disease)    takes Pantoprazole  daily   History of colon polyps    benign   Hyperlipidemia    takes Atorvastatin  daily   Hypertension    takes Amlodipine  and Lisinopril  daily   Insomnia    takes Trazodone  nightly   Nocturia    Thyroid  disease    Urinary  frequency    Urinary urgency     ALLERGIES:  is allergic to synthroid [levothyroxine sodium], hydrochlorothiazide, and iodine.  MEDICATIONS:  Current Outpatient Medications  Medication Sig Dispense Refill   ACCU-CHEK AVIVA PLUS test strip USE TO MONITOR GLUCOSE LEVELS TWICE A DAY E11.9 100 strip 12   ACCU-CHEK SOFTCLIX LANCETS lancets Use to monitor glucose levels BID; E11.9 (Patient taking differently: 1 each by Other route 2 (two) times daily. E11.9) 100 each 12   acetaminophen  (TYLENOL ) 500 MG tablet Take 1 tablet (500 mg total) by mouth every 6 (six) hours as needed. (Patient taking differently: Take 500 mg by mouth every 6 (six) hours as needed (for pain).) 30 tablet 0   APIXABAN  (ELIQUIS ) VTE STARTER PACK (10MG  AND 5MG ) Take as directed on package: start with two-5mg  tablets twice daily for 7 days. On day 8, switch to one-5mg  tablet twice daily. 74 each 0   atorvastatin  (LIPITOR) 20 MG tablet Take 20 mg by mouth daily.  11   Blood Glucose Calibration (GLUCOSE CONTROL) SOLN 1 Bottle by In Vitro route as needed. Use to calibrate Accu-Chek Aviva Plus device prn; please provide control solution compatible with Accu-Chek Aviva Plus device. (Patient taking differently: 1 each by Other route as needed (Use to calibrate glucometer). E11.9) 1 each 1   Blood Glucose Monitoring Suppl (ACCU-CHEK AVIVA PLUS) w/Device KIT 1 each by Does not apply route 2 (two) times daily. Use to monitor glucose levels BID; E11.9 (Patient taking differently: 1 each by Does not apply route 2 (two) times daily. E11.9) 1 kit 0   clobetasol  cream (TEMOVATE ) 0.05 % Apply 1 Application topically 2 (two) times daily as needed (itching).     clotrimazole  (MYCELEX ) 10 MG troche Take 10 mg by mouth 5 (five) times daily.     cyclobenzaprine  (FLEXERIL ) 10 MG tablet TAKE 1 TABLET BY MOUTH TWICE A DAY AS NEEDED FOR MUSCLE SPASMS     diclofenac  Sodium (VOLTAREN  ARTHRITIS PAIN) 1 % GEL Apply 4 g topically 4 (four) times daily. (Patient  taking differently: Apply 4 g topically 3 (three) times daily as needed (joint pain).) 150 g 0   escitalopram  (LEXAPRO ) 10 MG tablet Take 1 tablet (10 mg total) by mouth every morning. 90 tablet 3   ferrous sulfate  325 (65 FE) MG tablet Take 1 tablet (325 mg total) by mouth 2 (two) times daily with a meal. 60 tablet 0   folic acid  (FOLVITE ) 1 MG tablet Take 1 tablet (1 mg total) by mouth daily. Start 7 days before pemetrexed  chemotherapy. Continue until 21 days after pemetrexed  completed. 100 tablet 3   HYDROcodone  bit-homatropine (HYCODAN) 5-1.5 MG/5ML syrup Take 5 mLs by mouth every 6 (six) hours as needed for cough. 120 mL 0   insulin  glargine (LANTUS  SOLOSTAR) 100 UNIT/ML Solostar Pen Inject 15 Units into the skin at bedtime. 15 mL 3   Insulin  Pen Needle (B-D ULTRAFINE III SHORT PEN) 31G X 8 MM MISC USE AS DIRECTED 100 each 5   ipratropium (ATROVENT) 0.03 % nasal spray Place 2 sprays into both nostrils 2 (two) times daily.  lidocaine  (XYLOCAINE ) 2 % solution SMARTSIG:By Mouth     lidocaine -prilocaine  (EMLA ) cream Apply to affected area once 30 g 3   magic mouthwash SOLN Take 5 mLs by mouth 3 (three) times daily as needed for mouth pain. Suspension contains equal amounts of Maalox Extra Strength, nystatin, and diphenhydramine .     memantine  (NAMENDA ) 10 MG tablet Take 1 tablet (10 mg total) by mouth 2 (two) times daily. Take through 01-20-24, then stop. 180 tablet 0   ondansetron  (ZOFRAN ) 8 MG tablet Take 1 tablet (8 mg total) by mouth every 8 (eight) hours as needed for nausea or vomiting. Start on the third day after carboplatin . 30 tablet 1   pantoprazole  (PROTONIX ) 40 MG tablet TAKE 1 TABLET BY MOUTH TWICE A DAY 30 MINS BEFORE YOUR FIRST AND LAST MEAL. 180 tablet 1   prochlorperazine  (COMPAZINE ) 10 MG tablet Take 1 tablet (10 mg total) by mouth every 6 (six) hours as needed for nausea or vomiting. 30 tablet 1   traZODone  (DESYREL ) 100 MG tablet Take 2 tablets (200 mg total) by mouth at  bedtime. 180 tablet 3   triamcinolone  cream (KENALOG ) 0.1 % Apply 1 application topically 2 (two) times daily. (Patient taking differently: Apply 1 application  topically 2 (two) times daily as needed (irritation).) 30 g 0   No current facility-administered medications for this visit.    SURGICAL HISTORY:  Past Surgical History:  Procedure Laterality Date   ABDOMINAL HYSTERECTOMY     ABDOMINAL SURGERY     BIOPSY  05/28/2019   Procedure: BIOPSY;  Surgeon: Alyce Jubilee, MD;  [facility];  Service: Endoscopy;;  random colon/gastric/duodenum   BIOPSY  01/11/2023   Procedure: BIOPSY;  Surgeon: Vinetta Greening, DO;  [facility];  Service: Endoscopy;;   COLONOSCOPY  2005 [facility]   TICs, IH   COLONOSCOPY N/A 03/08/2014   Procedure: COLONOSCOPY;  Surgeon: Alyce Jubilee, MD;  [facility]: [facility];  Service: Endoscopy;  Laterality: N/A;  1015   COLONOSCOPY WITH PROPOFOL  N/A 05/28/2019   Procedure: COLONOSCOPY WITH PROPOFOL ;  Surgeon: Alyce Jubilee, MD;  [facility];  Service: Endoscopy;  Laterality: N/A;  9:15am   CYSTOSTOMY  11/22/2011   Procedure: CYSTOSTOMY SUPRAPUBIC;  Surgeon: Reggie Caper, MD;  Location: [facility];  Service: Urology;  Laterality: N/A;   ESOPHAGOGASTRODUODENOSCOPY N/A 03/08/2014   Procedure: ESOPHAGOGASTRODUODENOSCOPY (EGD);  Surgeon: Alyce Jubilee, MD;  [facility];  Service: Endoscopy;  Laterality: N/A;   ESOPHAGOGASTRODUODENOSCOPY     ESOPHAGOGASTRODUODENOSCOPY (EGD) WITH PROPOFOL  N/A 05/28/2019   Procedure: ESOPHAGOGASTRODUODENOSCOPY (EGD) WITH PROPOFOL ;  Surgeon: Alyce Jubilee, MD;  [facility];  Service: Endoscopy;  Laterality: N/A;   ESOPHAGOGASTRODUODENOSCOPY (EGD) WITH PROPOFOL  N/A 01/11/2023   Procedure: ESOPHAGOGASTRODUODENOSCOPY (EGD) WITH PROPOFOL ;  Surgeon: Vinetta Greening, DO;  [facility];  Service: Endoscopy;  Laterality: N/A;  9:45 am, asa 3   GIVENS CAPSULE STUDY N/A 06/15/2019    Procedure: GIVENS CAPSULE STUDY;  Surgeon: Alyce Jubilee, MD;  [facility];  Service: Endoscopy;  Laterality: N/A;  7:30am   HALLUX VALGUS CORRECTION     bilateral foot surgery for bone repairs-multiple   IR IMAGING GUIDED PORT INSERTION  08/19/2023   LOBECTOMY Right 10/15/2016   Procedure: RIGHT UPPER LOBECTOMY;  Surgeon: Zelphia Higashi, MD;  Location: [facility] OR;  Service: Thoracic;  Laterality: Right;   LYMPH NODE DISSECTION Right 10/15/2016   Procedure: LYMPH NODE  DISSECTION;  Surgeon: Zelphia Higashi, MD;  Location: [facility] OR;  Service: Thoracic;  Laterality: Right;   POLYPECTOMY  05/28/2019   Procedure: POLYPECTOMY;  Surgeon: Alyce Jubilee, MD;  [facility];  Service: Endoscopy;;   SAVORY DILATION N/A 05/28/2019   Procedure: SAVORY DILATION;  Surgeon: Alyce Jubilee, MD;  [facility];  Service: Endoscopy;  Laterality: N/A;  15/16/17   VAGINA RECONSTRUCTION SURGERY     tvt 2006- [place]    VIDEO ASSISTED THORACOSCOPY (VATS)/WEDGE RESECTION Right 10/15/2016   Procedure: RIGHT VIDEO ASSISTED THORACOSCOPY (VATS)/WEDGE RESECTION;  Surgeon: Zelphia Higashi, MD;  Location: [facility];  Service: Thoracic;  Laterality: Right;    REVIEW OF SYSTEMS:  Constitutional: positive for fatigue Eyes: negative Ears, nose, mouth, throat, and face: negative Respiratory: negative Cardiovascular: negative Gastrointestinal: negative Genitourinary:negative Integument/breast: negative Hematologic/lymphatic: negative Musculoskeletal:negative Neurological: negative Behavioral/Psych: negative Endocrine: negative Allergic/Immunologic: negative   PHYSICAL EXAMINATION: General appearance: alert, cooperative, fatigued, and no distress Head: Normocephalic, without obvious abnormality, atraumatic Neck: no adenopathy, no JVD, supple, symmetrical, trachea midline, and thyroid  not enlarged, symmetric, no tenderness/mass/nodules Lymph nodes: Cervical,  supraclavicular, and axillary nodes normal. Resp: clear to auscultation bilaterally Back: symmetric, no curvature. ROM normal. No CVA tenderness. Cardio: regular rate and rhythm, S1, S2 normal, no murmur, click, rub or gallop GI: soft, non-tender; bowel sounds normal; no masses,  no organomegaly Extremities: extremities normal, atraumatic, no cyanosis or edema Neurologic: Alert and oriented X 3, normal strength and tone. Normal symmetric reflexes. Normal coordination and gait  ECOG PERFORMANCE STATUS: 1 - Symptomatic but completely ambulatory  Blood pressure 123/62, pulse 63, temperature (!) 97.3 F (36.3 C), temperature source Temporal, resp. rate 15, height 5\' 7"  (1.702 m), weight 164 lb 1.6 oz (74.4 kg), SpO2 100%.  LABORATORY DATA: Lab Results  Component Value Date   WBC 2.9 (L) 11/20/2023   HGB 7.7 (L) 11/20/2023   HCT 22.2 (L) 11/20/2023   MCV 81.3 11/20/2023   PLT 196 11/20/2023      Chemistry      Component Value Date/Time   NA 137 11/13/2023 0510   NA 142 10/29/2022 1158   NA 141 05/29/2017 0921   K 3.7 11/13/2023 0510   K 4.8 05/29/2017 0921   CL 103 11/13/2023 0510   CO2 28 11/13/2023 0510   CO2 28 05/29/2017 0921   BUN 15 11/13/2023 0510   BUN 21 10/29/2022 1158   BUN 13.5 05/29/2017 0921   CREATININE 1.06 (H) 11/13/2023 0510   CREATININE 1.09 (H) 11/06/2023 1429   CREATININE 0.8 05/29/2017 0921      Component Value Date/Time   CALCIUM  8.5 (L) 11/13/2023 0510   CALCIUM  9.5 05/29/2017 0921   ALKPHOS 55 11/13/2023 0510   ALKPHOS 104 05/29/2017 0921   AST 20 11/13/2023 0510   AST 26 11/06/2023 1429   AST 17 05/29/2017 0921   ALT 14 11/13/2023 0510   ALT 18 11/06/2023 1429   ALT 20 05/29/2017 0921   BILITOT 0.6 11/13/2023 0510   BILITOT 0.8 11/06/2023 1429   BILITOT 0.40 05/29/2017 0921       RADIOGRAPHIC STUDIES: ECHOCARDIOGRAM COMPLETE Result Date: 11/13/2023    ECHOCARDIOGRAM REPORT   Patient Name:   [name] Date of Exam: 11/13/2023  Medical Rec #:  [id number]        Height:       67.0 in Accession #:    [id number]       Weight:  167.1 lb Date of Birth:  August 05, 1955        BSA:          1.874 m Patient Age:    37 years         BP:           139/85 mmHg Patient Gender: F                HR:           79 bpm. Exam Location:  [facility] Procedure: 2D Echo, Cardiac Doppler and Color Doppler (Both Spectral and Color            Flow Doppler were utilized during procedure). Indications:    Syncope R55  History:        Patient has no prior history of Echocardiogram examinations.                 Signs/Symptoms:Syncope; Risk Factors:Hypertension, Diabetes and                 Dyslipidemia. Stage IV adenocarcinoma of lung, right [facility]),                 Metastatic cancer to brain [facility]).  Sonographer:    Denese Finn RCS Referring Phys: [id number] OLADAPO ADEFESO IMPRESSIONS  1. Left ventricular ejection fraction, by estimation, is 55 to 60%. The left ventricle has normal function. The left ventricle has no regional wall motion abnormalities. Left ventricular diastolic parameters were normal.  2. Right ventricular systolic function is normal. The right ventricular size is normal. There is normal pulmonary artery systolic pressure. The estimated right ventricular systolic pressure is 20.3 mmHg.  3. The mitral valve is degenerative. Trivial mitral valve regurgitation.  4. The aortic valve is tricuspid. There is mild calcification of the aortic valve. Aortic valve regurgitation is mild. Aortic valve sclerosis/calcification is present, without any evidence of aortic stenosis.  5. The inferior vena cava is normal in size with greater than 50% respiratory variability, suggesting right atrial pressure of 3 mmHg. Comparison(s): No prior Echocardiogram. FINDINGS  Left Ventricle: Left ventricular ejection fraction, by estimation, is 55 to 60%. The left ventricle has normal function. The left ventricle has no regional wall motion abnormalities. The left ventricular  internal cavity size was normal in size. There is  borderline left ventricular hypertrophy. Left ventricular diastolic parameters were normal. Right Ventricle: The right ventricular size is normal. No increase in right ventricular wall thickness. Right ventricular systolic function is normal. There is normal pulmonary artery systolic pressure. The tricuspid regurgitant velocity is 2.08 m/s, and  with an assumed right atrial pressure of 3 mmHg, the estimated right ventricular systolic pressure is 20.3 mmHg. Left Atrium: Left atrial size was normal in size. Right Atrium: Right atrial size was normal in size. Pericardium: There is no evidence of pericardial effusion. Mitral Valve: The mitral valve is degenerative in appearance. Mild mitral annular calcification. Trivial mitral valve regurgitation. Tricuspid Valve: The tricuspid valve is grossly normal. Tricuspid valve regurgitation is mild. Aortic Valve: The aortic valve is tricuspid. There is mild calcification of the aortic valve. There is mild to moderate aortic valve annular calcification. Aortic valve regurgitation is mild. Aortic regurgitation PHT measures 781 msec. Aortic valve sclerosis/calcification is present, without any evidence of aortic stenosis. Pulmonic Valve: The pulmonic valve was grossly normal. Pulmonic valve regurgitation is trivial. Aorta: The aortic root is normal in size and structure. Venous: The inferior vena cava is normal in size with greater than 50%  respiratory variability, suggesting right atrial pressure of 3 mmHg. IAS/Shunts: No atrial level shunt detected by color flow Doppler. Additional Comments: 3D was performed not requiring image post processing on an independent workstation and was indeterminate.  LEFT VENTRICLE PLAX 2D LVIDd:         5.10 cm   Diastology LVIDs:         3.40 cm   LV e' medial:    7.72 cm/s LV PW:         0.90 cm   LV E/e' medial:  12.5 LV IVS:        1.00 cm   LV e' lateral:   9.68 cm/s LVOT diam:     2.00 cm    LV E/e' lateral: 10.0 LV SV:         91 LV SV Index:   48 LVOT Area:     3.14 cm  RIGHT VENTRICLE RV S prime:     11.50 cm/s TAPSE (M-mode): 2.6 cm LEFT ATRIUM             Index        RIGHT ATRIUM           Index LA diam:        3.90 cm 2.08 cm/m   RA Area:     20.40 cm LA Vol (A2C):   52.9 ml 28.23 ml/m  RA Volume:   57.10 ml  30.48 ml/m LA Vol (A4C):   55.7 ml 29.73 ml/m LA Biplane Vol: 57.2 ml 30.53 ml/m  AORTIC VALVE LVOT Vmax:   121.00 cm/s LVOT Vmean:  77.000 cm/s LVOT VTI:    0.289 m AI PHT:      781 msec  AORTA Ao Root diam: 3.20 cm MITRAL VALVE               TRICUSPID VALVE MV Area (PHT): 4.15 cm    TR Peak grad:   17.3 mmHg MV Decel Time: 183 msec    TR Vmax:        208.00 cm/s MV E velocity: 96.40 cm/s MV A velocity: 94.30 cm/s  SHUNTS MV E/A ratio:  1.02        Systemic VTI:  0.29 m                            Systemic Diam: 2.00 cm Teddie Favre MD Electronically signed by Teddie Favre MD Signature Date/Time: 11/13/2023/3:41:13 PM    Final    US  Carotid Bilateral Result Date: 11/13/2023 CLINICAL DATA:  Syncope, thrombocytopenia, hypertension, hyperlipidemia, diabetes EXAM: BILATERAL CAROTID DUPLEX ULTRASOUND TECHNIQUE: [name] scale imaging, color Doppler and duplex ultrasound were performed of bilateral carotid and vertebral arteries in the neck. COMPARISON:  None available FINDINGS: Criteria: Quantification of carotid stenosis is based on velocity parameters that correlate the residual internal carotid diameter with NASCET-based stenosis levels, using the diameter of the distal internal carotid lumen as the denominator for stenosis measurement. The following velocity measurements were obtained: RIGHT ICA: 109/36 cm/sec CCA: 69/14 cm/sec SYSTOLIC ICA/CCA RATIO:  1.6 ECA: 77 cm/sec LEFT ICA: 143/42 cm/sec CCA: 74/14 cm/sec SYSTOLIC ICA/CCA RATIO:  1.9 ECA: 63 cm/sec RIGHT CAROTID ARTERY: Mild heterogeneous plaque of the right ICA origin. RIGHT VERTEBRAL ARTERY:  Antegrade flow. LEFT CAROTID  ARTERY: Mildly elevated peak systolic and end-diastolic velocities of the distal left internal carotid artery are likely artifact given lack of significant atheromatous plaque. LEFT VERTEBRAL ARTERY:  Antegrade flow. IMPRESSION:  1. Mild atheromatous plaque of the right ICA origin with Doppler measurements consistent with less than 50% stenosis. 2. Less than 50% stenosis of the left internal carotid artery. Electronically Signed   By: Elester Grim M.D.   On: 11/13/2023 11:34   CT Lumbar Spine Wo Contrast Result Date: 11/12/2023 CLINICAL DATA:  Back trauma, no prior imaging (Age >= 16y) h/o cancer with fall, metastatic lung cancer EXAM: CT LUMBAR SPINE WITHOUT CONTRAST TECHNIQUE: Multidetector CT imaging of the lumbar spine was performed without intravenous contrast administration. Multiplanar CT image reconstructions were also generated. RADIATION DOSE REDUCTION: This exam was performed according to the departmental dose-optimization program which includes automated exposure control, adjustment of the mA and/or kV according to patient size and/or use of iterative reconstruction technique. COMPARISON:  10/22/2023 FINDINGS: Segmentation: 5 lumbar type vertebrae. Alignment: Normal. Vertebrae: There is an acute minimally displaced fracture of the right L1 transverse process. No acute fracture identified. Vertebral body height is preserved. Modic changes noted within the adjoining L2-3 endplates. No suspicious lytic or blastic bone lesion. Paraspinal and other soft tissues: Negative. Disc levels: There is intervertebral disc space narrowing throughout the lumbar spine with associated vacuum disc phenomena at L2-3, L3-4, and L5-S1 in keeping with changes of moderate to severe degenerative disc disease, most severe at L2-3. Multifactorial central canal stenosis, severe at L2-3 and moderate at L1-2 and L3-4. Facet hypertrophy results in severe neuroforaminal narrowing on the left at L3-4 and L4-5 with probable abutment of  the exiting L3-L4 nerve roots. Impingement of the left lateral recess at L3-4 and L4-5 also noted IMPRESSION: 1. Acute minimally displaced fracture of the right L1 transverse process. 2. No acute fracture or malalignment of the lumbar spine. 3. Multilevel degenerative disc disease and facet arthropathy resulting in multilevel central canal stenosis, severe at L2-3 and moderate at L1-2 and L3-4. 4. Severe neuroforaminal narrowing on the left at L3-4 and L4-5 with probable abutment of the exiting L3 and L4 nerve roots. If indicated, this could be better assessed with dedicated MRI examination Electronically Signed   By: Worthy Heads M.D.   On: 11/12/2023 23:45   CT Head Wo Contrast Result Date: 11/12/2023 CLINICAL DATA:  Head trauma, minor (Age >= 65y) h/o cancer having syncope EXAM: CT HEAD WITHOUT CONTRAST TECHNIQUE: Contiguous axial images were obtained from the base of the skull through the vertex without intravenous contrast. RADIATION DOSE REDUCTION: This exam was performed according to the departmental dose-optimization program which includes automated exposure control, adjustment of the mA and/or kV according to patient size and/or use of iterative reconstruction technique. COMPARISON:  10/07/2023 FINDINGS: Brain: Subtle areas of vasogenic edema are identified within the frontal lobes bilaterally related to the numerous intra-axial metastatic foci noted on prior MRI examination. These are not visualized, however, on the current examination due to changes in modality. No acute intracranial hemorrhage. No abnormal mass effect or midline shift. No abnormal extra-axial fluid collection. Ventricular size is normal. Cerebellum is unremarkable. Vascular: No hyperdense vessel or unexpected calcification. Skull: Right temporal parosteal osteoma is unchanged. No focal lytic lesion. No acute fracture. Sinuses/Orbits: No acute finding. Other: Mastoid air cells and middle ear cavities are clear. IMPRESSION: 1. No  acute intracranial hemorrhage. 2. Subtle areas of vasogenic edema are identified within the frontal lobes bilaterally related to the numerous intra-axial metastatic foci noted on prior MRI examination. These foci are not visualized, however, on the current examination due to changes in modality. Electronically Signed   By: Worthy Heads  M.D.   On: 11/12/2023 23:31   DG HIP UNILAT WITH PELVIS 2-3 VIEWS RIGHT Result Date: 11/12/2023 CLINICAL DATA:  Fall EXAM: DG HIP (WITH OR WITHOUT PELVIS) 2-3V RIGHT COMPARISON:  None Available. FINDINGS: There is no evidence of hip fracture or dislocation. There is no evidence of arthropathy or other focal bone abnormality. IMPRESSION: Negative. Electronically Signed   By: Tyron Gallon M.D.   On: 11/12/2023 23:31   CT CHEST ABDOMEN PELVIS W CONTRAST Result Date: 10/22/2023 CLINICAL DATA:  Staging non-small-cell lung cancer. Prior right upper lobectomy. * Tracking Code: BO * EXAM: CT CHEST, ABDOMEN, AND PELVIS WITH CONTRAST TECHNIQUE: Multidetector CT imaging of the chest, abdomen and pelvis was performed following the standard protocol during bolus administration of intravenous contrast. RADIATION DOSE REDUCTION: This exam was performed according to the departmental dose-optimization program which includes automated exposure control, adjustment of the mA and/or kV according to patient size and/or use of iterative reconstruction technique. CONTRAST:  OMNIPAQUE  IOHEXOL  300 MG/ML  SOLN COMPARISON:  CT chest dated 06/13/2023, nuclear medicine PET dated 07/16/2023 FINDINGS: CT CHEST FINDINGS Cardiovascular: Right chest wall port tip terminates at the right atrium. Normal heart size. No significant pericardial fluid/thickening. Great vessels are normal in course and caliber. Bilateral filling defects within lower lobar proximal segmental pulmonary arteries (2:32, 31). Coronary artery calcifications. Mediastinum/Nodes: Imaged thyroid  gland without nodules meeting criteria for  imaging follow-up by size. Normal esophagus. Decreased size of multi station lymphadenopathy, for example 6 mm left supraclavicular (2:7), previously 17 mm, 13 mm right axillary (2:13), previously 22 mm, and 11 mm subcarinal (2:28), previously 20 mm. Lungs/Pleura: The central airways are patent. Postsurgical changes of right upper lobectomy. Interval improved right hilar soft tissue thickening with decreased effacement of right middle lobe bronchi. Unchanged 2 mm left upper lobe perifissural nodule (4:35). No pneumothorax. No pleural effusion. Musculoskeletal: Increased sclerosis of left T11 metastasis measuring 1.7 x 1.1 cm (2:48), previously 1.3 x 0.9 cm. Multilevel degenerative changes of the thoracic spine. CT ABDOMEN PELVIS FINDINGS Hepatobiliary: No focal hepatic lesions. No intra or extrahepatic biliary ductal dilation. Normal gallbladder. Pancreas: No focal lesions or main ductal dilation. Spleen: Diffusely heterogeneous appearance of the splenic parenchyma with focal hypoattenuating foci measuring 12 mm (2:55) and 10 mm (2:60) which appear to correspond to subtle areas of hypermetabolism on prior PET. Adrenals/Urinary Tract: Decreased size of bilateral adrenal nodules measuring 9 mm on the right and 12 mm on the left (2:57), previously 13 mm and 18 mm, respectively. No suspicious renal mass or hydronephrosis. Punctate nonobstructing left interpolar stone. Irregular bladder stone. Stomach/Bowel: Normal appearance of the stomach. No evidence of bowel wall thickening, distention, or inflammatory changes. Moderately distended rectum contains large volume stool. Colonic diverticulosis without acute diverticulitis. 7 mm hyperattenuating rounded focus along the anterolateral aspect of the ascending colon (2:81), most likely a diverticulum. Normal appendix. Vascular/Lymphatic: Aortic atherosclerosis. Slightly decreased size of left external iliac lymph node measuring 12 mm (2:99), previously 14 mm. Slightly  increased prominence of a few subcentimeter retroperitoneal lymph nodes, for example 7 mm aortocaval (2:69), previously 5 mm and 6 mm left para-aortic (2:70), previously 4 mm, and 8 mm left common iliac lymph node (2:80), previously 5 mm. Reproductive: No adnexal masses. Other: No free fluid, fluid collection, or free air. Musculoskeletal: No acute or abnormal lytic or blastic osseous findings. Multilevel degenerative changes of the lumbar spine. Postsurgical changes of the anterior abdominal wall. IMPRESSION: 1. Bilateral filling defects within lower lobar proximal segmental pulmonary arteries,  consistent with pulmonary emboli. No evidence of right heart strain. 2. Interval improved right hilar soft tissue thickening with decreased effacement of right middle lobe bronchi. 3. Decreased size of multi station lymphadenopathy and bilateral adrenal metastases. 4. Increased sclerosis of left T11 metastasis, likely treatment related changes. 5. Diffusely heterogeneous appearance of the splenic parenchyma with focal hypoattenuating foci measuring 12 mm and 10 mm which appear to correspond to subtle areas of hypermetabolism on prior PET, suspicious for metastases. 6. Slightly increased prominence of a few subcentimeter retroperitoneal lymph nodes, which are nonspecific and may be reactive. Attention on follow-up. 7. Moderately distended rectum contains large volume stool. Subcentimeter hyperattenuating focus along the ascending colon, most likely a colonic diverticulum. Attention on follow-up. 8. Aortic Atherosclerosis (ICD10-I70.0). Coronary artery calcifications. Assessment for potential risk factor modification, dietary therapy or pharmacologic therapy may be warranted, if clinically indicated. Critical Value/emergent results were called by telephone at the time of interpretation on 10/22/2023 at 3:57 pm to provider [facility] , who verbally acknowledged these results. Electronically Signed   By: Limin  Xu M.D.   On:  10/22/2023 16:09      ASSESSMENT AND PLAN: This is a very pleasant 68 years old white female with stage IV (T1a, N3, M1 C) non-small cell lung cancer initially diagnosed as stage IA non-small cell lung cancer status post right upper lobectomy with lymph node dissection.  This was diagnosed in March 2018 with evidence for disease recurrence and metastasis in December 2024. The patient has been on observation and she is feeling fine today with no concerning complaints. The patient had repeat CT scan of the chest performed recently.  I personally independently reviewed the scan images and discussed the results with the patient today.  Unfortunately her scan showed interval development of several abnormal lymph nodes throughout the chest including right hilar, mediastinal, supraclavicular and bilateral axillary as well as upper abdominal lymphadenopathy.  There was also increasing right adrenal nodule worrisome for adrenal metastasis. She underwent ultrasound-guided core biopsy of right axillary lymph node and the final pathology was consistent with metastatic adenocarcinoma of lung primary.  We will send the tissue block to [facility] for molecular studies and PD-L1 expression.  Molecular studies by [id number] showed positive KRAS G12C mutation. She underwent whole brain radiation for brain metastasis. The patient is here today to start the first dose of systemic chemotherapy with carboplatin  for AUC of 5, Alimta 500 Mg/M2 and Keytruda  200 Mg IV every 3 weeks.  First dose August 21, 2023.  Status post 4 cycles. She has been tolerating her treatment fairly well except for the recent syncopal episode secondary to anemia.    Metastatic non-small cell lung cancer Metastatic non-small cell lung cancer, adenocarcinoma, with positive KRAS G12C mutation, diagnosed in December 2024. Currently undergoing systemic chemotherapy. Completed four cycles of carboplatin , Alimta, and Keytruda . Transitioning to cycle  five with Alimta and Keytruda  only, as carboplatin  is being discontinued. Anticipated reduction in side effects with the new regimen. Continued monitoring of blood counts is necessary due to chemotherapy. - Administer Alimta and Keytruda  every three weeks - Monitor blood counts during treatment visits - Perform next scan to assess for blood clots  Anemia due to chemotherapy Chronic anemia exacerbated by chemotherapy. Hemoglobin levels are low at 7.7, with a significant drop to 5.8 during recent hospitalization, likely contributing to a syncopal episode. Anemia is being managed with iron  supplementation. One unit of blood will be administered to improve hemoglobin levels and alleviate symptoms. - Administer  one unit of blood to improve hemoglobin levels - Continue iron  tablets  Thrombocytopenia due to chemotherapy Thrombocytopenia observed during chemotherapy, with platelet counts dropping to 26 during recent hospitalization. Platelet counts have since recovered to 196. Thrombocytopenia is a known side effect of chemotherapy, with expected fluctuations in platelet levels. No significant bleeding risk identified. The low platelet count was not the cause of the syncopal episode. - Continue monitoring platelet counts during treatment visits   The patient was advised to call immediately if she has any other concerning symptoms in the interval.   The patient voices understanding of current disease status and treatment options and is in agreement with the current care plan. All questions were answered. The patient knows to call the clinic with any problems, questions or concerns. We can certainly see the patient much sooner if necessary.  Disclaimer: This note was dictated with voice recognition software. Similar sounding words can inadvertently be transcribed and may not be corrected upon review.

## 2023-11-20 NOTE — Progress Notes (Signed)
 Per Dr. Marguerita Shih ,it is ok to give pt Keytruda  and Alimta today and ANC =1.0 and HGB = 7.7. Pt to be scheduled at [facility] for blood transfusion tomorrow.

## 2023-11-20 NOTE — Progress Notes (Signed)
 Nutrition Follow-up:  Pt with recurrent lung cancer mets to bone and brain. S/p right VATS/right lobectomy (2018) and whole brain radiation (last RT 08/16/23). Pt currently receiving  maintenance alimta + keytruda  q21d. Patient is under the care of Dr. Marguerita Shih.   4/15-4/17 - admission s/p syncope  Met with pt in infusion. She is eating a bag of corn chips at visit. Patient reports feeling like a brand new person since discharge. Patient endorses increased energy and appetite. She was able to drive herself to church on Sunday which she has not done in months. Patient continues to struggle with taste changes, however utilizing strategies previously discussed are beneficial. She denies nausea, vomiting, diarrhea. Bowels are moving slow. She is taking a stool softener and drinking prune juice. Last BM was 2-3 days ago. Patient is drinking mostly water , sprite, and some juices. Patient hopeful for continued improvement with completion of induction therapy.   Medications: reviewed   Labs: glucose 148, Cr 1.27, Hgb 7.7  Anthropometrics: Wt 164 lb 1.6 oz today (hospitalized)  4/3 - 167 lb 1.6 oz 3/19 - 162 lb 3.2 oz   NUTRITION DIAGNOSIS: Unintended wt loss - ongoing   INTERVENTION:  Continue strategies for increasing calories and protein with small frequent meals and snacks     MONITORING, EVALUATION, GOAL: wt trends, intake   NEXT VISIT: Wednesday June 4 during infusion

## 2023-11-20 NOTE — Patient Instructions (Signed)
 [facility] - A DEPT OF [facility]  Discharge Instructions: Thank you for choosing [facility] to provide your oncology and hematology care.   If you have a lab appointment with the Cancer Center, please go directly to the Cancer Center and check in at the registration area.   Wear comfortable clothing and clothing appropriate for easy access to any Portacath or PICC line.   We strive to give you quality time with your provider. You may need to reschedule your appointment if you arrive late (15 or more minutes).  Arriving late affects you and other patients whose appointments are after yours.  Also, if you miss three or more appointments without notifying the office, you may be dismissed from the clinic at the provider's discretion.      For prescription refill requests, have your pharmacy contact our office and allow 72 hours for refills to be completed.    Today you received the following chemotherapy and/or immunotherapy agents: Keytruda  and alimta   To help prevent nausea and vomiting after your treatment, we encourage you to take your nausea medication as directed.  BELOW ARE SYMPTOMS THAT SHOULD BE REPORTED IMMEDIATELY: *FEVER GREATER THAN 100.4 F (38 C) OR HIGHER *CHILLS OR SWEATING *NAUSEA AND VOMITING THAT IS NOT CONTROLLED WITH YOUR NAUSEA MEDICATION *UNUSUAL SHORTNESS OF BREATH *UNUSUAL BRUISING OR BLEEDING *URINARY PROBLEMS (pain or burning when urinating, or frequent urination) *BOWEL PROBLEMS (unusual diarrhea, constipation, pain near the anus) TENDERNESS IN MOUTH AND THROAT WITH OR WITHOUT PRESENCE OF ULCERS (sore throat, sores in mouth, or a toothache) UNUSUAL RASH, SWELLING OR PAIN  UNUSUAL VAGINAL DISCHARGE OR ITCHING   Items with * indicate a potential emergency and should be followed up as soon as possible or go to the Emergency Department if any problems should occur.  Please show the CHEMOTHERAPY ALERT CARD or  IMMUNOTHERAPY ALERT CARD at check-in to the Emergency Department and triage nurse.  Should you have questions after your visit or need to cancel or reschedule your appointment, please contact [facility] - A DEPT OF [facility]  Dept: [phone]  and follow the prompts.  Office hours are 8:00 a.m. to 4:30 p.m. Monday - Friday. Please note that voicemails left after 4:00 p.m. may not be returned until the following business day.  We are closed weekends and major holidays. You have access to a nurse at all times for urgent questions. Please call the main number to the clinic Dept: [phone] and follow the prompts.   For any non-urgent questions, you may also contact your provider using MyChart. We now offer e-Visits for anyone 43 and older to request care online for non-urgent symptoms. For details visit [email or web address].   Also download the MyChart app! Go to the app store, search "MyChart", open the app, select [place], and log in with your MyChart username and password.

## 2023-11-21 ENCOUNTER — Inpatient Hospital Stay

## 2023-11-21 ENCOUNTER — Other Ambulatory Visit: Payer: Self-pay | Admitting: Internal Medicine

## 2023-11-21 DIAGNOSIS — Z5111 Encounter for antineoplastic chemotherapy: Secondary | ICD-10-CM | POA: Diagnosis not present

## 2023-11-21 DIAGNOSIS — D649 Anemia, unspecified: Secondary | ICD-10-CM

## 2023-11-21 LAB — PREPARE RBC (CROSSMATCH)

## 2023-11-21 MED ORDER — SODIUM CHLORIDE 0.9% FLUSH
10.0000 mL | INTRAVENOUS | Status: DC | PRN
Start: 2023-11-21 — End: 2023-11-21
  Administered 2023-11-21: 10 mL via INTRAVENOUS

## 2023-11-21 MED ORDER — HEPARIN SOD (PORK) LOCK FLUSH 100 UNIT/ML IV SOLN: 500.0000 [IU] | Freq: Once | INTRAVENOUS | Status: DC

## 2023-11-21 NOTE — Progress Notes (Signed)
 Patient presents today for labs per provider's order. Vital signs stable and patient voiced no complaints at this time. Patient will return tomorrow for 1 unit of blood. Patient remains accessed for blood.  Discharged from clinic ambulatory in stable condition. Alert and oriented x 3. F/U with [facility] as scheduled.

## 2023-11-22 ENCOUNTER — Inpatient Hospital Stay

## 2023-11-22 DIAGNOSIS — Z5111 Encounter for antineoplastic chemotherapy: Secondary | ICD-10-CM | POA: Diagnosis not present

## 2023-11-22 DIAGNOSIS — D649 Anemia, unspecified: Secondary | ICD-10-CM

## 2023-11-22 MED ORDER — DIPHENHYDRAMINE HCL 25 MG PO CAPS
25.0000 mg | ORAL_CAPSULE | Freq: Once | ORAL | Status: AC
  Administered 2023-11-22: 25 mg via ORAL
  Filled 2023-11-22: qty 1

## 2023-11-22 MED ORDER — ACETAMINOPHEN 325 MG PO TABS
650.0000 mg | ORAL_TABLET | Freq: Once | ORAL | Status: AC
Start: 2023-11-22 — End: 2023-11-22
  Administered 2023-11-22: 650 mg via ORAL
  Filled 2023-11-22: qty 2

## 2023-11-22 MED ORDER — HEPARIN SOD (PORK) LOCK FLUSH 100 UNIT/ML IV SOLN
500.0000 [IU] | Freq: Every day | INTRAVENOUS | Status: AC | PRN
  Administered 2023-11-22: 500 [IU]

## 2023-11-22 MED ORDER — SODIUM CHLORIDE 0.9% FLUSH
10.0000 mL | INTRAVENOUS | Status: AC | PRN
Start: 2023-11-22 — End: 2023-11-22
  Administered 2023-11-22: 10 mL

## 2023-11-22 MED ORDER — SODIUM CHLORIDE 0.9% IV SOLUTION
250.0000 mL | INTRAVENOUS | Status: DC
  Administered 2023-11-22: 250 mL via INTRAVENOUS

## 2023-11-22 NOTE — Patient Instructions (Signed)
 [facility] [place] - A DEPT OF [facility]  Discharge Instructions: Thank you for choosing [facility] to provide your oncology and hematology care.  If you have a lab appointment with the Cancer Center - please note that after April 8th, 2024, all labs will be drawn in the cancer center.  You do not have to check in or register with the main entrance as you have in the past but will complete your check-in in the cancer center.  Wear comfortable clothing and clothing appropriate for easy access to any Portacath or PICC line.   We strive to give you quality time with your provider. You may need to reschedule your appointment if you arrive late (15 or more minutes).  Arriving late affects you and other patients whose appointments are after yours.  Also, if you miss three or more appointments without notifying the office, you may be dismissed from the clinic at the provider's discretion.      For prescription refill requests, have your pharmacy contact our office and allow 72 hours for refills to be completed.    Today you received the following 1 unit of PRBCs, return as scheduled.   To help prevent nausea and vomiting after your treatment, we encourage you to take your nausea medication as directed.  BELOW ARE SYMPTOMS THAT SHOULD BE REPORTED IMMEDIATELY: *FEVER GREATER THAN 100.4 F (38 C) OR HIGHER *CHILLS OR SWEATING *NAUSEA AND VOMITING THAT IS NOT CONTROLLED WITH YOUR NAUSEA MEDICATION *UNUSUAL SHORTNESS OF BREATH *UNUSUAL BRUISING OR BLEEDING *URINARY PROBLEMS (pain or burning when urinating, or frequent urination) *BOWEL PROBLEMS (unusual diarrhea, constipation, pain near the anus) TENDERNESS IN MOUTH AND THROAT WITH OR WITHOUT PRESENCE OF ULCERS (sore throat, sores in mouth, or a toothache) UNUSUAL RASH, SWELLING OR PAIN  UNUSUAL VAGINAL DISCHARGE OR ITCHING   Items with * indicate a potential emergency and should be followed up as soon as  possible or go to the Emergency Department if any problems should occur.  Please show the CHEMOTHERAPY ALERT CARD or IMMUNOTHERAPY ALERT CARD at check-in to the Emergency Department and triage nurse.  Should you have questions after your visit or need to cancel or reschedule your appointment, please contact [facility] [place] - A DEPT OF [facility] [phone]  and follow the prompts.  Office hours are 8:00 a.m. to 4:30 p.m. Monday - Friday. Please note that voicemails left after 4:00 p.m. may not be returned until the following business day.  We are closed weekends and major holidays. You have access to a nurse at all times for urgent questions. Please call the main number to the clinic [phone] and follow the prompts.  For any non-urgent questions, you may also contact your provider using MyChart. We now offer e-Visits for anyone 17 and older to request care online for non-urgent symptoms. For details visit [email or web address].   Also download the MyChart app! Go to the app store, search "MyChart", open the app, select [place], and log in with your MyChart username and password.

## 2023-11-22 NOTE — Progress Notes (Signed)
 Patient presents today for 1 unit of PRBCs. Patient tolerated therapy with no complaints voiced. Side effects with management reviewed with understanding verbalized. Port site clean and dry with no bruising or swelling noted at site. Good blood return noted before and after administration of therapy. Band aid applied. Patient left in satisfactory condition with VSS and no s/s of distress noted.

## 2023-11-23 LAB — TYPE AND SCREEN
ABO/RH(D): A NEG
Antibody Screen: NEGATIVE
Unit division: 0

## 2023-11-23 LAB — BPAM RBC
Blood Product Expiration Date: 202505132359
ISSUE DATE / TIME: 202504250840
Unit Type and Rh: 600

## 2023-11-25 ENCOUNTER — Telehealth: Payer: Self-pay | Admitting: *Deleted

## 2023-11-25 ENCOUNTER — Telehealth: Payer: Self-pay | Admitting: Radiation Oncology

## 2023-11-25 ENCOUNTER — Telehealth: Payer: Self-pay

## 2023-11-25 ENCOUNTER — Telehealth: Payer: Self-pay | Admitting: Medical Oncology

## 2023-11-25 ENCOUNTER — Encounter: Payer: Self-pay | Admitting: Internal Medicine

## 2023-11-25 NOTE — Telephone Encounter (Signed)
 Called patient to inform of MRI for 12-06-23- arrival time- 11:45 am @ [facility], no restrictions to scan, patient to receive results from Dr. [name] on 12-11-23 @ 11:20 am, lvm for a return call

## 2023-11-25 NOTE — Telephone Encounter (Signed)
 Transferred call from radiation-  Spoke with patient in regards to appts canceled. Informed scheduler, Vanessa and she is working on rescheduling patients appts.  Informed [facility] radiation admin per secure chat about Dr. Arthea Bilis appt. Patient verbalized understanding and was told to call with any scheduling questions.

## 2023-11-25 NOTE — Telephone Encounter (Signed)
 err

## 2023-11-25 NOTE — Telephone Encounter (Addendum)
 4/28 @ 10:17 am Received call from patient stating she received several alerts that all of her appts has been cancel including FU20 with Dr. [name].  Called and spoke to [place] in research, apparently it was a misunderstanding that appts was canceled.  Follow up call to Dr. Bing Buff office spoke to [place], RN/Diane Parke Boll, RN forward call as requested.

## 2023-11-26 ENCOUNTER — Inpatient Hospital Stay: Payer: Medicare Other

## 2023-11-27 ENCOUNTER — Inpatient Hospital Stay

## 2023-11-27 ENCOUNTER — Telehealth: Payer: Self-pay | Admitting: Internal Medicine

## 2023-11-27 DIAGNOSIS — C3491 Malignant neoplasm of unspecified part of right bronchus or lung: Secondary | ICD-10-CM

## 2023-11-27 DIAGNOSIS — Z95828 Presence of other vascular implants and grafts: Secondary | ICD-10-CM

## 2023-11-27 DIAGNOSIS — Z5111 Encounter for antineoplastic chemotherapy: Secondary | ICD-10-CM | POA: Diagnosis not present

## 2023-11-27 LAB — CBC WITH DIFFERENTIAL (CANCER CENTER ONLY)
Abs Immature Granulocytes: 0 10*3/uL (ref 0.00–0.07)
Basophils Absolute: 0 10*3/uL (ref 0.0–0.1)
Basophils Relative: 2 %
Eosinophils Absolute: 0 10*3/uL (ref 0.0–0.5)
Eosinophils Relative: 1 %
HCT: 22.7 % — ABNORMAL LOW (ref 36.0–46.0)
Hemoglobin: 8 g/dL — ABNORMAL LOW (ref 12.0–15.0)
Lymphocytes Relative: 56 %
Lymphs Abs: 0.7 10*3/uL (ref 0.7–4.0)
MCH: 27.6 pg (ref 26.0–34.0)
MCHC: 35.2 g/dL (ref 30.0–36.0)
MCV: 78.3 fL — ABNORMAL LOW (ref 80.0–100.0)
Monocytes Absolute: 0.1 10*3/uL (ref 0.1–1.0)
Monocytes Relative: 5 %
Neutro Abs: 0.5 10*3/uL — ABNORMAL LOW (ref 1.7–7.7)
Neutrophils Relative %: 36 %
Platelet Count: 123 10*3/uL — ABNORMAL LOW (ref 150–400)
RBC: 2.9 MIL/uL — ABNORMAL LOW (ref 3.87–5.11)
RDW: 17.8 % — ABNORMAL HIGH (ref 11.5–15.5)
Smear Review: NORMAL
WBC Count: 1.3 10*3/uL — ABNORMAL LOW (ref 4.0–10.5)
nRBC: 0 % (ref 0.0–0.2)

## 2023-11-27 LAB — CMP (CANCER CENTER ONLY)
ALT: 12 U/L (ref 0–44)
AST: 22 U/L (ref 15–41)
Albumin: 3.2 g/dL — ABNORMAL LOW (ref 3.5–5.0)
Alkaline Phosphatase: 71 U/L (ref 38–126)
Anion gap: 6 (ref 5–15)
BUN: 12 mg/dL (ref 8–23)
CO2: 27 mmol/L (ref 22–32)
Calcium: 8.5 mg/dL — ABNORMAL LOW (ref 8.9–10.3)
Chloride: 106 mmol/L (ref 98–111)
Creatinine: 1.03 mg/dL — ABNORMAL HIGH (ref 0.44–1.00)
GFR, Estimated: 59 mL/min — ABNORMAL LOW (ref >60)
Glucose, Bld: 161 mg/dL — ABNORMAL HIGH (ref 70–99)
Potassium: 4.1 mmol/L (ref 3.5–5.1)
Sodium: 139 mmol/L (ref 135–145)
Total Bilirubin: 0.7 mg/dL (ref 0.0–1.2)
Total Protein: 5.9 g/dL — ABNORMAL LOW (ref 6.5–8.1)

## 2023-11-27 MED ORDER — HEPARIN SOD (PORK) LOCK FLUSH 100 UNIT/ML IV SOLN
500.0000 [IU] | Freq: Once | INTRAVENOUS | Status: AC
  Administered 2023-11-27: 500 [IU]

## 2023-11-27 MED ORDER — SODIUM CHLORIDE 0.9% FLUSH
10.0000 mL | Freq: Once | INTRAVENOUS | Status: AC
  Administered 2023-11-27: 10 mL

## 2023-11-29 ENCOUNTER — Telehealth: Payer: Self-pay | Admitting: Internal Medicine

## 2023-11-29 ENCOUNTER — Other Ambulatory Visit: Payer: Self-pay

## 2023-11-29 ENCOUNTER — Telehealth: Payer: Self-pay

## 2023-11-29 ENCOUNTER — Other Ambulatory Visit: Payer: Self-pay | Admitting: Physician Assistant

## 2023-11-29 ENCOUNTER — Encounter: Payer: Self-pay | Admitting: Internal Medicine

## 2023-11-29 ENCOUNTER — Inpatient Hospital Stay: Attending: Internal Medicine

## 2023-11-29 VITALS — BP 116/59 | HR 70 | Temp 98.3°F | Resp 16

## 2023-11-29 DIAGNOSIS — C3490 Malignant neoplasm of unspecified part of unspecified bronchus or lung: Secondary | ICD-10-CM | POA: Diagnosis not present

## 2023-11-29 DIAGNOSIS — I083 Combined rheumatic disorders of mitral, aortic and tricuspid valves: Secondary | ICD-10-CM | POA: Diagnosis not present

## 2023-11-29 DIAGNOSIS — Z7984 Long term (current) use of oral hypoglycemic drugs: Secondary | ICD-10-CM | POA: Diagnosis not present

## 2023-11-29 DIAGNOSIS — Z5111 Encounter for antineoplastic chemotherapy: Secondary | ICD-10-CM | POA: Diagnosis present

## 2023-11-29 DIAGNOSIS — C7931 Secondary malignant neoplasm of brain: Secondary | ICD-10-CM | POA: Diagnosis not present

## 2023-11-29 DIAGNOSIS — G4700 Insomnia, unspecified: Secondary | ICD-10-CM | POA: Diagnosis not present

## 2023-11-29 DIAGNOSIS — C7951 Secondary malignant neoplasm of bone: Secondary | ICD-10-CM | POA: Insufficient documentation

## 2023-11-29 DIAGNOSIS — Z860101 Personal history of adenomatous and serrated colon polyps: Secondary | ICD-10-CM | POA: Diagnosis not present

## 2023-11-29 DIAGNOSIS — E119 Type 2 diabetes mellitus without complications: Secondary | ICD-10-CM | POA: Diagnosis not present

## 2023-11-29 DIAGNOSIS — D61818 Other pancytopenia: Secondary | ICD-10-CM | POA: Insufficient documentation

## 2023-11-29 DIAGNOSIS — C3491 Malignant neoplasm of unspecified part of right bronchus or lung: Secondary | ICD-10-CM

## 2023-11-29 DIAGNOSIS — G4730 Sleep apnea, unspecified: Secondary | ICD-10-CM | POA: Diagnosis not present

## 2023-11-29 DIAGNOSIS — Z794 Long term (current) use of insulin: Secondary | ICD-10-CM | POA: Diagnosis not present

## 2023-11-29 DIAGNOSIS — Z95828 Presence of other vascular implants and grafts: Secondary | ICD-10-CM

## 2023-11-29 DIAGNOSIS — E785 Hyperlipidemia, unspecified: Secondary | ICD-10-CM | POA: Insufficient documentation

## 2023-11-29 DIAGNOSIS — K219 Gastro-esophageal reflux disease without esophagitis: Secondary | ICD-10-CM | POA: Diagnosis not present

## 2023-11-29 DIAGNOSIS — Z9641 Presence of insulin pump (external) (internal): Secondary | ICD-10-CM | POA: Insufficient documentation

## 2023-11-29 DIAGNOSIS — I10 Essential (primary) hypertension: Secondary | ICD-10-CM | POA: Insufficient documentation

## 2023-11-29 MED ORDER — FILGRASTIM-SNDZ 300 MCG/0.5ML IJ SOSY
300.0000 ug | PREFILLED_SYRINGE | Freq: Once | INTRAMUSCULAR | Status: AC
Start: 2023-11-29 — End: 2023-11-29
  Administered 2023-11-29: 300 ug via SUBCUTANEOUS
  Filled 2023-11-29: qty 0.5

## 2023-11-29 NOTE — Telephone Encounter (Signed)
 Spoke with patient in regards to lab results.  Per providers- patients WBC is low and Zarxio  is needed. Informed patient of neutropenic precautions, side effects of Zarxio , and to take Claritin for 5-7 days and tylenol  as needed. Scheduler will call patient with appts.  Patient is aware and voiced understanding.

## 2023-11-29 NOTE — Telephone Encounter (Signed)
 Scheduled appointment per provider requested. Left the patient a voicemail with appointment details. She called me back as well and confirmed appointments scheduled.

## 2023-11-30 ENCOUNTER — Inpatient Hospital Stay

## 2023-11-30 VITALS — BP 136/41 | HR 70 | Temp 97.6°F | Resp 17

## 2023-11-30 DIAGNOSIS — Z95828 Presence of other vascular implants and grafts: Secondary | ICD-10-CM

## 2023-11-30 DIAGNOSIS — Z5111 Encounter for antineoplastic chemotherapy: Secondary | ICD-10-CM | POA: Diagnosis not present

## 2023-11-30 MED ORDER — FILGRASTIM-SNDZ 300 MCG/0.5ML IJ SOSY
300.0000 ug | PREFILLED_SYRINGE | Freq: Once | INTRAMUSCULAR | Status: AC
  Administered 2023-11-30: 300 ug via SUBCUTANEOUS

## 2023-12-04 ENCOUNTER — Other Ambulatory Visit: Payer: Self-pay

## 2023-12-04 ENCOUNTER — Emergency Department (HOSPITAL_COMMUNITY)
Admission: EM | Admit: 2023-12-04 | Discharge: 2023-12-05 | Disposition: A | Discharge status: Home / Self Care | Attending: Emergency Medicine | Admitting: Emergency Medicine

## 2023-12-04 ENCOUNTER — Telehealth: Payer: Self-pay | Admitting: Medical Oncology

## 2023-12-04 ENCOUNTER — Ambulatory Visit: Payer: Medicare Other

## 2023-12-04 ENCOUNTER — Inpatient Hospital Stay

## 2023-12-04 ENCOUNTER — Telehealth: Payer: Self-pay

## 2023-12-04 ENCOUNTER — Encounter (HOSPITAL_COMMUNITY): Payer: Self-pay | Admitting: *Deleted

## 2023-12-04 ENCOUNTER — Other Ambulatory Visit: Payer: Medicare Other

## 2023-12-04 ENCOUNTER — Ambulatory Visit: Payer: Medicare Other | Admitting: Internal Medicine

## 2023-12-04 ENCOUNTER — Emergency Department (HOSPITAL_COMMUNITY)

## 2023-12-04 DIAGNOSIS — D61818 Other pancytopenia: Secondary | ICD-10-CM | POA: Diagnosis not present

## 2023-12-04 DIAGNOSIS — E119 Type 2 diabetes mellitus without complications: Secondary | ICD-10-CM | POA: Diagnosis not present

## 2023-12-04 DIAGNOSIS — D649 Anemia, unspecified: Secondary | ICD-10-CM

## 2023-12-04 DIAGNOSIS — Z85118 Personal history of other malignant neoplasm of bronchus and lung: Secondary | ICD-10-CM | POA: Diagnosis not present

## 2023-12-04 DIAGNOSIS — Z95828 Presence of other vascular implants and grafts: Secondary | ICD-10-CM

## 2023-12-04 DIAGNOSIS — I10 Essential (primary) hypertension: Secondary | ICD-10-CM | POA: Insufficient documentation

## 2023-12-04 DIAGNOSIS — C3491 Malignant neoplasm of unspecified part of right bronchus or lung: Secondary | ICD-10-CM

## 2023-12-04 DIAGNOSIS — R0789 Other chest pain: Secondary | ICD-10-CM | POA: Diagnosis present

## 2023-12-04 DIAGNOSIS — Z79899 Other long term (current) drug therapy: Secondary | ICD-10-CM | POA: Diagnosis not present

## 2023-12-04 DIAGNOSIS — Z794 Long term (current) use of insulin: Secondary | ICD-10-CM | POA: Insufficient documentation

## 2023-12-04 LAB — CBC WITH DIFFERENTIAL (CANCER CENTER ONLY)
Abs Immature Granulocytes: 0.14 10*3/uL — ABNORMAL HIGH (ref 0.00–0.07)
Basophils Absolute: 0 10*3/uL (ref 0.0–0.1)
Basophils Relative: 0 %
Eosinophils Absolute: 0 10*3/uL (ref 0.0–0.5)
Eosinophils Relative: 0 %
HCT: 16.5 % — ABNORMAL LOW (ref 36.0–46.0)
Hemoglobin: 5.9 g/dL — CL (ref 12.0–15.0)
Immature Granulocytes: 5 %
Lymphocytes Relative: 34 %
Lymphs Abs: 1 10*3/uL (ref 0.7–4.0)
MCH: 27.4 pg (ref 26.0–34.0)
MCHC: 35.8 g/dL (ref 30.0–36.0)
MCV: 76.7 fL — ABNORMAL LOW (ref 80.0–100.0)
Monocytes Absolute: 0.6 10*3/uL (ref 0.1–1.0)
Monocytes Relative: 19 %
Neutro Abs: 1.3 10*3/uL — ABNORMAL LOW (ref 1.7–7.7)
Neutrophils Relative %: 42 %
Platelet Count: 16 10*3/uL — ABNORMAL LOW (ref 150–400)
RBC: 2.15 MIL/uL — ABNORMAL LOW (ref 3.87–5.11)
RDW: 16.4 % — ABNORMAL HIGH (ref 11.5–15.5)
WBC Count: 3 10*3/uL — ABNORMAL LOW (ref 4.0–10.5)
nRBC: 0 % (ref 0.0–0.2)

## 2023-12-04 LAB — CMP (CANCER CENTER ONLY)
ALT: 14 U/L (ref 0–44)
AST: 23 U/L (ref 15–41)
Albumin: 3.2 g/dL — ABNORMAL LOW (ref 3.5–5.0)
Alkaline Phosphatase: 69 U/L (ref 38–126)
Anion gap: 9 (ref 5–15)
BUN: 15 mg/dL (ref 8–23)
CO2: 26 mmol/L (ref 22–32)
Calcium: 8.3 mg/dL — ABNORMAL LOW (ref 8.9–10.3)
Chloride: 104 mmol/L (ref 98–111)
Creatinine: 1.21 mg/dL — ABNORMAL HIGH (ref 0.44–1.00)
GFR, Estimated: 49 mL/min — ABNORMAL LOW (ref >60)
Glucose, Bld: 142 mg/dL — ABNORMAL HIGH (ref 70–99)
Potassium: 3.7 mmol/L (ref 3.5–5.1)
Sodium: 139 mmol/L (ref 135–145)
Total Bilirubin: 0.5 mg/dL (ref 0.0–1.2)
Total Protein: 5.8 g/dL — ABNORMAL LOW (ref 6.5–8.1)

## 2023-12-04 LAB — PREPARE RBC (CROSSMATCH)

## 2023-12-04 LAB — TROPONIN I (HIGH SENSITIVITY)
Troponin I (High Sensitivity): 10 ng/L (ref <18)
Troponin I (High Sensitivity): 11 ng/L (ref <18)

## 2023-12-04 LAB — RESEARCH LABS

## 2023-12-04 LAB — ABO/RH: ABO/RH(D): A NEG

## 2023-12-04 MED ORDER — HEPARIN SOD (PORK) LOCK FLUSH 100 UNIT/ML IV SOLN
500.0000 [IU] | Freq: Once | INTRAVENOUS | Status: AC
  Administered 2023-12-04: 500 [IU]

## 2023-12-04 MED ORDER — SODIUM CHLORIDE 0.9% IV SOLUTION: Freq: Once | INTRAVENOUS | Status: DC

## 2023-12-04 MED ORDER — SODIUM CHLORIDE 0.9% FLUSH
10.0000 mL | Freq: Once | INTRAVENOUS | Status: AC
Start: 2023-12-04 — End: 2023-12-04
  Administered 2023-12-04: 10 mL

## 2023-12-04 NOTE — Telephone Encounter (Signed)
 Spoke with patient in regards to lab results.  Per Dr. Marguerita Shih- Hgb 5.9 and needs to be evaluated in the ER.  Patient stated that she will go to [facility] ER.

## 2023-12-04 NOTE — Progress Notes (Signed)
 CRITICAL VALUE STICKER  CRITICAL VALUE: hgb 5.9   RECEIVER (on-site recipient of call): Odilia Bennett   DATE & TIME NOTIFIED: 12/04/2023 @ 1:50  MESSENGER (representative from lab): Amber  MD NOTIFIED: Dr. Marguerita Shih  TIME OF NOTIFICATION: 2:13 pm   RESPONSE: Recommendation is to be evaluated in the ER

## 2023-12-04 NOTE — Discharge Instructions (Addendum)
 Thank you for coming to [facility] Emergency Department. You were seen for low hemoglobin and were given two units of packed red blood cells and one unit of platelets. Please follow up with your oncologist within 1 week for a lab recheck.   Do not hesitate to return to the ED or call 911 if you experience: -Worsening symptoms -Lightheadedness, passing out -Worsening chest pain -Shortness of breath -Bleeding -Fevers/chills -Anything else that concerns you

## 2023-12-04 NOTE — Telephone Encounter (Signed)
 Last wed-started having a " Dry short cough since my low white blood cells". Periodically when it happens I feel winded/pressure  in the chest .  She is going on a 89 th birthday trip to [place]   from  07/10 to 07/14  and wants to make sure she does not have any appts.  I told her she does not.

## 2023-12-04 NOTE — ED Triage Notes (Signed)
 Pt sent here for low hemoglobin. C/o generalized weakness.

## 2023-12-04 NOTE — ED Provider Notes (Signed)
  EMERGENCY DEPARTMENT AT [facility] Provider Note   CSN: [id number] Arrival date & time: 12/04/23  1438     History  Chief Complaint  Patient presents with   abnormal lab    [name] is a 68 y.o. female with  stage IV adenocarcinoma of right lung, type 2 diabetes mellitus, hypertension, GERD, hyperlipidemia who presents with low hemoglobin. C/o generalized weakness. Recently had blood transfusions after episode of syncope, Hgb most recently 8.0 on 11/27/23, now today was 5.9. She denies any melena/hematochezia, never has had h/o GIB, doesn't take eliquis  anymore. Does report a couple of days of chest pressure, has known bilateral pulmonary emboli.  She denies any shortness of breath, fevers or chills.  Endorses a dry cough.  Denies any nausea or vomiting, leg swelling, orthopnea.    Past Medical History:  Diagnosis Date   Adenocarcinoma of right lung, stage 1 (HCC) 11/29/2016   Cancer (HCC)    Chronic pain    Depression    takes Lexapro  daily   Diabetes mellitus    takes Metformin  daily and has an insulin  pump.Fasting blood sugar runs250   GERD (gastroesophageal reflux disease)    takes Pantoprazole  daily   History of colon polyps    benign   Hyperlipidemia    takes Atorvastatin  daily   Hypertension    takes Amlodipine  and Lisinopril  daily   Insomnia    takes Trazodone  nightly   Nocturia    Thyroid  disease    Urinary frequency    Urinary urgency        Home Medications Prior to Admission medications   Medication Sig Start Date End Date Taking? Authorizing Provider  acetaminophen  (TYLENOL ) 500 MG tablet Take 1 tablet (500 mg total) by mouth every 6 (six) hours as needed. Patient taking differently: Take 500 mg by mouth every 6 (six) hours as needed (for pain). 08/07/17  Yes Horton, Vonzella Guernsey, MD  atorvastatin  (LIPITOR) 20 MG tablet Take 20 mg by mouth daily. 04/27/17  Yes [provider]  clotrimazole  (MYCELEX ) 10 MG troche Take 10 mg  by mouth 5 (five) times daily. 11/06/23  Yes [provider]  escitalopram  (LEXAPRO ) 10 MG tablet Take 1 tablet (10 mg total) by mouth every morning. 10/25/23  Yes Alysia Bachelor, MD  ferrous sulfate  325 (65 FE) MG tablet Take 1 tablet (325 mg total) by mouth 2 (two) times daily with a meal. 11/15/23 12/15/23 Yes Shahmehdi, Constantino Demark, MD  folic acid  (FOLVITE ) 1 MG tablet Take 1 tablet (1 mg total) by mouth daily. Start 7 days before pemetrexed  chemotherapy. Continue until 21 days after pemetrexed  completed. 08/06/23  Yes Marlene Simas, MD  insulin  glargine (LANTUS  SOLOSTAR) 100 UNIT/ML Solostar Pen Inject 15 Units into the skin at bedtime. Patient taking differently: Inject 15 Units into the skin at bedtime as needed (high bs). 06/25/23  Yes Reardon, Arminda Landmark, NP  lidocaine  (XYLOCAINE ) 2 % solution every 6 (six) hours as needed for mouth pain. Apply topically to mouth for sores 11/06/23  Yes [provider]  lidocaine -prilocaine  (EMLA ) cream Apply to affected area once Patient taking differently: Apply 1 Application topically once. Apply to affected area once 08/06/23  Yes Marlene Simas, MD  magic mouthwash SOLN Take 5 mLs by mouth 3 (three) times daily as needed for mouth pain. Suspension contains equal amounts of Maalox Extra Strength, nystatin, and diphenhydramine .   Yes [provider]  memantine  (NAMENDA ) 10 MG tablet Take 1 tablet (10 mg  total) by mouth 2 (two) times daily. Take through 01-20-24, then stop. 10/18/23  Yes Colie Dawes, MD  ondansetron  (ZOFRAN ) 8 MG tablet Take 1 tablet (8 mg total) by mouth every 8 (eight) hours as needed for nausea or vomiting. Start on the third day after carboplatin . 08/06/23  Yes Marlene Simas, MD  pantoprazole  (PROTONIX ) 40 MG tablet TAKE 1 TABLET BY MOUTH TWICE A DAY 30 MINS BEFORE YOUR FIRST AND LAST MEAL. 11/20/23  Yes Lanney Pitts, PA-C  prochlorperazine  (COMPAZINE ) 10 MG tablet Take 1 tablet (10 mg total) by mouth every 6 (six)  hours as needed for nausea or vomiting. 08/06/23  Yes Marlene Simas, MD  traZODone  (DESYREL ) 100 MG tablet Take 2 tablets (200 mg total) by mouth at bedtime. 10/25/23  Yes Alysia Bachelor, MD  ACCU-CHEK AVIVA PLUS test strip USE TO MONITOR GLUCOSE LEVELS TWICE A DAY E11.9 11/27/21   Gwyndolyn Lerner, MD  ACCU-CHEK SOFTCLIX LANCETS lancets Use to monitor glucose levels BID; E11.9 Patient taking differently: 1 each by Other route 2 (two) times daily. E11.9 08/21/18   Gwyndolyn Lerner, MD  Blood Glucose Calibration (GLUCOSE CONTROL) SOLN 1 Bottle by In Vitro route as needed. Use to calibrate Accu-Chek Aviva Plus device prn; please provide control solution compatible with Accu-Chek Aviva Plus device. Patient taking differently: 1 each by Other route as needed (Use to calibrate glucometer). E11.9 08/21/18   Gwyndolyn Lerner, MD  Blood Glucose Monitoring Suppl (ACCU-CHEK AVIVA PLUS) w/Device KIT 1 each by Does not apply route 2 (two) times daily. Use to monitor glucose levels BID; E11.9 Patient taking differently: 1 each by Does not apply route 2 (two) times daily. E11.9 08/19/18   Gwyndolyn Lerner, MD  Insulin  Pen Needle (B-D ULTRAFINE III SHORT PEN) 31G X 8 MM MISC USE AS DIRECTED 01/17/23   Wendel Hals, NP      Allergies    Synthroid [levothyroxine sodium], Voltaren  [diclofenac  sodium], Hydrochlorothiazide, and Iodine    Review of Systems   Review of Systems A 10 point review of systems was performed and is negative unless otherwise reported in HPI.  Physical Exam Updated Vital Signs BP (!) 146/68   Pulse 63   Temp 97.7 F (36.5 C) (Oral)   Resp 18   Ht 5\' 7"  (1.702 m)   Wt 72.6 kg   SpO2 99%   BMI 25.06 kg/m  Physical Exam General: Normal appearing female, lying in bed.  HEENT: PERRLA, Sclera anicteric, MMM, trachea midline.  Cardiology: RRR, no murmurs/rubs/gallops. BL radial and DP pulses equal bilaterally.  Resp: Normal respiratory rate and effort. CTAB, no wheezes, rhonchi, crackles.  Abd:  Soft, non-tender, non-distended. No rebound tenderness or guarding.  GU: Deferred. MSK: No peripheral edema or signs of trauma. Extremities without deformity or TTP. No cyanosis or clubbing. Skin: warm, dry. No rashes or lesions. Back: No CVA tenderness Neuro: A&Ox4, CNs II-XII grossly intact. MAEs. Sensation grossly intact.  Psych: Normal mood and affect.   ED Results / Procedures / Treatments   Labs (all labs ordered are listed, but only abnormal results are displayed) Labs Reviewed  TYPE AND SCREEN  PREPARE RBC (CROSSMATCH)  PREPARE PLATELET PHERESIS  TROPONIN I (HIGH SENSITIVITY)  TROPONIN I (HIGH SENSITIVITY)    EKG None  Radiology DG Chest Portable 1 View Result Date: 12/04/2023 CLINICAL DATA:  Chest pressure EXAM: PORTABLE CHEST 1 VIEW COMPARISON:  August 28, 2023 FINDINGS: The heart size and mediastinal contours are within normal limits. Both lungs are clear. The  visualized skeletal structures are unremarkable. No change right IJ Infuse-A-Port catheter IMPRESSION: No active disease. Electronically Signed   By: Fredrich Jefferson M.D.   On: 12/04/2023 16:10    Procedures .Critical Care  Performed by: Merdis Stalling, MD Authorized by: Merdis Stalling, MD   Critical care provider statement:    Critical care time (minutes):  30   Critical care was necessary to treat or prevent imminent or life-threatening deterioration of the following conditions:  Circulatory failure   Critical care was time spent personally by me on the following activities:  Development of treatment plan with patient or surrogate, discussions with consultants, evaluation of patient's response to treatment, examination of patient, ordering and review of laboratory studies, ordering and review of radiographic studies, ordering and performing treatments and interventions, pulse oximetry, re-evaluation of patient's condition and review of old charts     Medications Ordered in ED Medications  0.9 %  sodium  chloride infusion (Manually program via Guardrails IV Fluids) (has no administration in time range)  0.9 %  sodium chloride  infusion (Manually program via Guardrails IV Fluids) (has no administration in time range)    ED Course/ Medical Decision Making/ A&P                          Medical Decision Making Amount and/or Complexity of Data Reviewed Labs: ordered. Decision-making details documented in ED Course. Radiology: ordered. Decision-making details documented in ED Course.  Risk Prescription drug management.    This patient presents to the ED for concern of pancytopenia, mild chest pressure, this involves an extensive number of treatment options, and is a complaint that carries with it a high risk of complications and morbidity.  I considered the following differential and admission for this acute, potentially life threatening condition.   MDM:    Hgb 5.9 earlier today. Will transfuse 2U PRBCs.  Patient's platelets also noted to be 16K earlier today. She has no active bleeding, no planned procedures, will d/w oncology.  For patient's mild chest pressure, her EKG does not demonstrate any signs of ischemia, pericarditis, or arrhythmia, troponin negative x 2.  Chest x-ray without any pneumonia, pulmonary edema, pleural effusion.  She does report chest pressure and a mild dry cough both of which could be caused by her known pulmonary emboli.  Mild chest pressure also could be caused by anemia.  Lower suspicion for dissection.  Discussed with patient about repeating a CT PE today and after shared decision making, patient is not hypoxic, tachypneic, short of breath, will hold off on CT PE for now and patient states that she will discuss with Dr. Liam Redhead her oncologist about when to repeat the CT PE.    Clinical Course as of 12/04/23 2313  Wed Dec 04, 2023  1734 DG Chest Portable 1 View No active disease. [HN]  1749 Troponin I (High Sensitivity): 11 neg [HN]  1858 Troponin I (High  Sensitivity): 11 flat [HN]  1936 Patient has platelets 16,000.  Had previously received a platelet transfusion with 26,000 per oncology.  Traditionally do not give platelet transfusion unless less than 10,000 unless bleeding, will discuss with oncology. [HN]  1944 D/w Dr. Cheree Cords who recommends transfusing platelets as well [HN]  1952 Discussed with patient about her chest pressure.  She has reassuring troponins and chest x-ray.  It is possible that her chest pressure is coming from either her significant anemia or her known bilateral pulmonary emboli.  Patient does  not feel short of breath, has no hypoxia, tachycardia, or tachypnea.  Shared decision making with the patient and we will hold off on the CT PE today. [HN]    Clinical Course User Index [HN] Merdis Stalling, MD    Labs: I Ordered, and personally interpreted labs.  The pertinent results include: Those listed above  Imaging Studies ordered: I ordered imaging studies including chest x-ray I independently visualized and interpreted imaging. I agree with the radiologist interpretation  Additional history obtained from chart review.   Cardiac Monitoring: The patient was maintained on a cardiac monitor.  I personally viewed and interpreted the cardiac monitored which showed an underlying rhythm of: Normal sinus rhythm  Reevaluation: After the interventions noted above, I reevaluated the patient and found that they have :improved  Social Determinants of Health:  lives independently  Disposition:  Patient will be discharged when she has completed her infusions of 2U PRBCs and 1 pack of platelets. Signed out to oncoming EDP Dr. Candelaria Chaco.    Co morbidities that complicate the patient evaluation  Past Medical History:  Diagnosis Date   Adenocarcinoma of right lung, stage 1 (HCC) 11/29/2016   Cancer (HCC)    Chronic pain    Depression    takes Lexapro  daily   Diabetes mellitus    takes Metformin  daily and has an insulin   pump.Fasting blood sugar runs250   GERD (gastroesophageal reflux disease)    takes Pantoprazole  daily   History of colon polyps    benign   Hyperlipidemia    takes Atorvastatin  daily   Hypertension    takes Amlodipine  and Lisinopril  daily   Insomnia    takes Trazodone  nightly   Nocturia    Thyroid  disease    Urinary frequency    Urinary urgency      Medicines Meds ordered this encounter  Medications   0.9 %  sodium chloride  infusion (Manually program via Guardrails IV Fluids)   0.9 %  sodium chloride  infusion (Manually program via Guardrails IV Fluids)    I have reviewed the patients home medicines and have made adjustments as needed  Problem List / ED Course: Problem List Items Addressed This Visit       Hematopoietic and Hemostatic   Pancytopenia (HCC) - Primary   Other Visit Diagnoses       Chest pressure                       This note was created using dictation software, which may contain spelling or grammatical errors.    Merdis Stalling, MD 12/04/23 [phone]

## 2023-12-05 DIAGNOSIS — R0789 Other chest pain: Secondary | ICD-10-CM | POA: Diagnosis not present

## 2023-12-05 LAB — TYPE AND SCREEN
ABO/RH(D): A NEG
Antibody Screen: NEGATIVE
Unit division: 0
Unit division: 0

## 2023-12-05 LAB — BPAM RBC
Blood Product Expiration Date: 202505102359
Blood Product Expiration Date: 202505142359
ISSUE DATE / TIME: 202505071752
ISSUE DATE / TIME: 202505072023
Unit Type and Rh: 600
Unit Type and Rh: 9500

## 2023-12-05 MED ORDER — HEPARIN SOD (PORK) LOCK FLUSH 100 UNIT/ML IV SOLN
INTRAVENOUS | Status: AC
  Filled 2023-12-05: qty 5

## 2023-12-05 NOTE — ED Notes (Signed)
 Lab reports they will call when platelets are ready

## 2023-12-05 NOTE — ED Notes (Signed)
 Pt called friend/family for ride home- pt ambulatory to bathroom and back to room.

## 2023-12-06 ENCOUNTER — Ambulatory Visit (HOSPITAL_COMMUNITY)
Admission: RE | Admit: 2023-12-06 | Discharge: 2023-12-06 | Disposition: A | Discharge status: Home / Self Care | Source: Ambulatory Visit | Attending: Radiation Oncology | Admitting: Radiation Oncology

## 2023-12-06 ENCOUNTER — Ambulatory Visit (HOSPITAL_COMMUNITY): Payer: Medicare Other

## 2023-12-06 DIAGNOSIS — C7931 Secondary malignant neoplasm of brain: Secondary | ICD-10-CM | POA: Insufficient documentation

## 2023-12-06 DIAGNOSIS — C3411 Malignant neoplasm of upper lobe, right bronchus or lung: Secondary | ICD-10-CM | POA: Insufficient documentation

## 2023-12-06 LAB — PREPARE PLATELET PHERESIS: Unit division: 0

## 2023-12-06 LAB — BPAM PLATELET PHERESIS
Blood Product Expiration Date: 202505082359
ISSUE DATE / TIME: 202505080016
Unit Type and Rh: 6200

## 2023-12-06 MED ORDER — GADOBUTROL 1 MMOL/ML IV SOLN
7.2000 mL | Freq: Once | INTRAVENOUS | Status: AC | PRN
  Administered 2023-12-06: 7.2 mL via INTRAVENOUS

## 2023-12-06 NOTE — Progress Notes (Signed)
  [name] is here today for follow up post radiation to the brain with Dr. [name].     They completed their radiation on: 08/16/2023 12/06/2023 MRI Brain W Wo Contrast     Does the patient complain of any of the following: Headache: None Visual Changes: None Hearing Changes:  Nausea: None Vomiting: None Balance or coordination issues: Dizziness upon standing  Memory issues: Some forgetfulness   Is the patient currently on a Decadron  regimen? : Yes, gets a dose of decadron  during chemotherapy treatment. Additional comments if applicable:   Constipation, encouraged to eat high fiber foods and  drink plenty of water  BP (!) 149/94 (BP Location: Right Arm, Patient Position: Sitting, Cuff Size: Normal)   Pulse 91   Temp 97.8 F (36.6 C)   Resp 18   Ht 5\' 7"  (1.702 m)   Wt 164 lb 6.4 oz (74.6 kg)   SpO2 100%   BMI 25.75 kg/m   Wt Readings from Last 3 Encounters:  12/11/23 164 lb 6.4 oz (74.6 kg)  12/04/23 160 lb (72.6 kg)  11/20/23 164 lb 1.6 oz (74.4 kg)

## 2023-12-10 ENCOUNTER — Inpatient Hospital Stay: Payer: Medicare Other

## 2023-12-10 NOTE — Progress Notes (Unsigned)
 [facility] OFFICE PROGRESS NOTE  Koirala, Dibas, MD [address] [place] [address]  DIAGNOSIS: 1) recurrent/metastatic non-small cell lung cancer, adenocarcinoma presented with metastatic adenopathy in the thorax as well as mediastinum, axillary lymph nodes and potential local recurrence at the right hilum along the inferior margin of the resection staple line with bilateral hypermetabolic adrenal metastasis and hypermetabolic bone metastasis to the T11 vertebral body as well as brain metastasis diagnosed in December 2024. 2) incidental bilaterally pulmonary embolism diagnosed on CT scan of the chest on October 22, 2023 2) Stage IA (T1a, N0, M0) non-small cell lung cancer, adenocarcinoma presented with right upper lobe pulmonary nodule    Detected Alteration(s) / Biomarker(s)Associated FDA-approved therapiesClinical Trial Availability% cfDNA or Amplification KRAS G12C approved by FDA Adagrasib, Sotorasib approved in other indication Adagrasib+cetuximab Yes2.8%   TP53 E204* None Yes2.0%  PRIOR THERAPY: 1) S/P right VATS with right upper lobectomy and mediastinal lymph node dissection under the care of Dr. Luna Salinas on October 15, 2016. 2) whole brain irradiation under the care of of Dr. [name].  CURRENT THERAPY: 1) Systemic chemotherapy with carboplatin  for AUC of 5, Alimta 500 Mg/M2 and Keytruda  200 Mg IV every 3 weeks.  First dose August 20, 2022.  Status post 5  cycles. Starting from cycle #5, she started maintenance Alimta and Keytruda  IV every 3 weeks.  2) Eliquis  starter kit with 10 mg p.o. twice daily for 7 days followed by 5 mg p.o. daily.  First dose started October 18, 2023.  INTERVAL HISTORY: [name] 68 y.o. female returns to the clinic today for follow-up visit.  The patient was last seen in the clinic 3 weeks ago by Dr. Marguerita Shih.  She is currently undergoing palliative systemic chemotherapy and immunotherapy.  She started the maintenance  portion of her treatment 3 weeks ago.  Despite this she continues to have cytopenias with significant anemia requiring a blood transfusion in the emergency room with ***2 units.  She is feeling ***at this time. She also had neutropenia which required GCSF injections x2, the most recent on 11/30/23  She also called the clinic in the interval reporting cough.    ***reduce dose of alimta ****  ***ask if taking iron .   I previously gave tessalon  for cough.   She denies any abnormal bleeding or bruising.  Her energy is ***.  She denies any fever, chills, night sweats, or unexplained weight loss.  Shortness of breath?  She denies any chest pain, cough, or hemoptysis.  She denies any nausea, vomiting, diarrhea, or constipation.  Denies any headache or visual changes.  Er pancytopenia and severe anemia.   MEDICAL HISTORY: Past Medical History:  Diagnosis Date   Adenocarcinoma of right lung, stage 1 (HCC) 11/29/2016   Cancer (HCC)    Chronic pain    Depression    takes Lexapro  daily   Diabetes mellitus    takes Metformin  daily and has an insulin  pump.Fasting blood sugar runs250   GERD (gastroesophageal reflux disease)    takes Pantoprazole  daily   History of colon polyps    benign   Hyperlipidemia    takes Atorvastatin  daily   Hypertension    takes Amlodipine  and Lisinopril  daily   Insomnia    takes Trazodone  nightly   Nocturia    Thyroid  disease    Urinary frequency    Urinary urgency     ALLERGIES:  is allergic to synthroid [levothyroxine sodium], voltaren  [diclofenac  sodium], hydrochlorothiazide, and iodine.  MEDICATIONS:  Current Outpatient Medications  Medication Sig Dispense Refill   ACCU-CHEK AVIVA PLUS test strip USE TO MONITOR GLUCOSE LEVELS TWICE A DAY E11.9 100 strip 12   ACCU-CHEK SOFTCLIX LANCETS lancets Use to monitor glucose levels BID; E11.9 (Patient taking differently: 1 each by Other route 2 (two) times daily. E11.9) 100 each 12   acetaminophen  (TYLENOL ) 500 MG  tablet Take 1 tablet (500 mg total) by mouth every 6 (six) hours as needed. (Patient taking differently: Take 500 mg by mouth every 6 (six) hours as needed (for pain).) 30 tablet 0   atorvastatin  (LIPITOR) 20 MG tablet Take 20 mg by mouth daily.  11   Blood Glucose Calibration (GLUCOSE CONTROL) SOLN 1 Bottle by In Vitro route as needed. Use to calibrate Accu-Chek Aviva Plus device prn; please provide control solution compatible with Accu-Chek Aviva Plus device. (Patient taking differently: 1 each by Other route as needed (Use to calibrate glucometer). E11.9) 1 each 1   Blood Glucose Monitoring Suppl (ACCU-CHEK AVIVA PLUS) w/Device KIT 1 each by Does not apply route 2 (two) times daily. Use to monitor glucose levels BID; E11.9 (Patient taking differently: 1 each by Does not apply route 2 (two) times daily. E11.9) 1 kit 0   clotrimazole  (MYCELEX ) 10 MG troche Take 10 mg by mouth 5 (five) times daily.     escitalopram  (LEXAPRO ) 10 MG tablet Take 1 tablet (10 mg total) by mouth every morning. 90 tablet 3   ferrous sulfate  325 (65 FE) MG tablet Take 1 tablet (325 mg total) by mouth 2 (two) times daily with a meal. 60 tablet 0   folic acid  (FOLVITE ) 1 MG tablet Take 1 tablet (1 mg total) by mouth daily. Start 7 days before pemetrexed  chemotherapy. Continue until 21 days after pemetrexed  completed. 100 tablet 3   insulin  glargine (LANTUS  SOLOSTAR) 100 UNIT/ML Solostar Pen Inject 15 Units into the skin at bedtime. (Patient taking differently: Inject 15 Units into the skin at bedtime as needed (high bs).) 15 mL 3   Insulin  Pen Needle (B-D ULTRAFINE III SHORT PEN) 31G X 8 MM MISC USE AS DIRECTED 100 each 5   lidocaine  (XYLOCAINE ) 2 % solution every 6 (six) hours as needed for mouth pain. Apply topically to mouth for sores     lidocaine -prilocaine  (EMLA ) cream Apply to affected area once (Patient taking differently: Apply 1 Application topically once. Apply to affected area once) 30 g 3   magic mouthwash SOLN Take  5 mLs by mouth 3 (three) times daily as needed for mouth pain. Suspension contains equal amounts of Maalox Extra Strength, nystatin, and diphenhydramine .     memantine  (NAMENDA ) 10 MG tablet Take 1 tablet (10 mg total) by mouth 2 (two) times daily. Take through 01-20-24, then stop. 180 tablet 0   ondansetron  (ZOFRAN ) 8 MG tablet Take 1 tablet (8 mg total) by mouth every 8 (eight) hours as needed for nausea or vomiting. Start on the third day after carboplatin . 30 tablet 1   pantoprazole  (PROTONIX ) 40 MG tablet TAKE 1 TABLET BY MOUTH TWICE A DAY 30 MINS BEFORE YOUR FIRST AND LAST MEAL. 180 tablet 1   prochlorperazine  (COMPAZINE ) 10 MG tablet Take 1 tablet (10 mg total) by mouth every 6 (six) hours as needed for nausea or vomiting. 30 tablet 1   traZODone  (DESYREL ) 100 MG tablet Take 2 tablets (200 mg total) by mouth at bedtime. 180 tablet 3   No current facility-administered medications for this visit.    SURGICAL HISTORY:  Past Surgical History:  Procedure Laterality Date   ABDOMINAL HYSTERECTOMY     ABDOMINAL SURGERY     BIOPSY  05/28/2019   Procedure: BIOPSY;  Surgeon: Alyce Jubilee, MD;  [facility]: [facility];  Service: Endoscopy;;  random colon/gastric/duodenum   BIOPSY  01/11/2023   Procedure: BIOPSY;  Surgeon: Vinetta Greening, DO;  [facility]: [facility];  Service: Endoscopy;;   COLONOSCOPY  2005 MAC   TICs, IH   COLONOSCOPY N/A 03/08/2014   Procedure: COLONOSCOPY;  Surgeon: Alyce Jubilee, MD;  [facility]: [facility];  Service: Endoscopy;  Laterality: N/A;  1015   COLONOSCOPY WITH PROPOFOL  N/A 05/28/2019   Procedure: COLONOSCOPY WITH PROPOFOL ;  Surgeon: Alyce Jubilee, MD;  [facility];  Service: Endoscopy;  Laterality: N/A;  9:15am   CYSTOSTOMY  11/22/2011   Procedure: CYSTOSTOMY SUPRAPUBIC;  Surgeon: Reggie Caper, MD;  Location: [facility];  Service: Urology;  Laterality: N/A;   ESOPHAGOGASTRODUODENOSCOPY N/A 03/08/2014   Procedure: ESOPHAGOGASTRODUODENOSCOPY  (EGD);  Surgeon: Alyce Jubilee, MD;  [facility];  Service: Endoscopy;  Laterality: N/A;   ESOPHAGOGASTRODUODENOSCOPY     ESOPHAGOGASTRODUODENOSCOPY (EGD) WITH PROPOFOL  N/A 05/28/2019   Procedure: ESOPHAGOGASTRODUODENOSCOPY (EGD) WITH PROPOFOL ;  Surgeon: Alyce Jubilee, MD;  [facility];  Service: Endoscopy;  Laterality: N/A;   ESOPHAGOGASTRODUODENOSCOPY (EGD) WITH PROPOFOL  N/A 01/11/2023   Procedure: ESOPHAGOGASTRODUODENOSCOPY (EGD) WITH PROPOFOL ;  Surgeon: Vinetta Greening, DO;  [facility];  Service: Endoscopy;  Laterality: N/A;  9:45 am, asa 3   GIVENS CAPSULE STUDY N/A 06/15/2019   Procedure: GIVENS CAPSULE STUDY;  Surgeon: Alyce Jubilee, MD;  [facility];  Service: Endoscopy;  Laterality: N/A;  7:30am   HALLUX VALGUS CORRECTION     bilateral foot surgery for bone repairs-multiple   IR IMAGING GUIDED PORT INSERTION  08/19/2023   LOBECTOMY Right 10/15/2016   Procedure: RIGHT UPPER LOBECTOMY;  Surgeon: Zelphia Higashi, MD;  Location: [facility] OR;  Service: Thoracic;  Laterality: Right;   LYMPH NODE DISSECTION Right 10/15/2016   Procedure: LYMPH NODE DISSECTION;  Surgeon: Zelphia Higashi, MD;  Location: [facility] OR;  Service: Thoracic;  Laterality: Right;   POLYPECTOMY  05/28/2019   Procedure: POLYPECTOMY;  Surgeon: Alyce Jubilee, MD;  [facility];  Service: Endoscopy;;   SAVORY DILATION N/A 05/28/2019   Procedure: SAVORY DILATION;  Surgeon: Alyce Jubilee, MD;  [facility];  Service: Endoscopy;  Laterality: N/A;  15/16/17   VAGINA RECONSTRUCTION SURGERY     tvt 2006-     VIDEO ASSISTED THORACOSCOPY (VATS)/WEDGE RESECTION Right 10/15/2016   Procedure: RIGHT VIDEO ASSISTED THORACOSCOPY (VATS)/WEDGE RESECTION;  Surgeon: Zelphia Higashi, MD;  Location: [facility] OR;  Service: Thoracic;  Laterality: Right;    REVIEW OF SYSTEMS:   Review of Systems  Constitutional: Negative for appetite change, chills,  fatigue, fever and unexpected weight change.  HENT:   Negative for mouth sores, nosebleeds, sore throat and trouble swallowing.   Eyes: Negative for eye problems and icterus.  Respiratory: Negative for cough, hemoptysis, shortness of breath and wheezing.   Cardiovascular: Negative for chest pain and leg swelling.  Gastrointestinal: Negative for abdominal pain, constipation, diarrhea, nausea and vomiting.  Genitourinary: Negative for bladder incontinence, difficulty urinating, dysuria, frequency and hematuria.   Musculoskeletal: Negative for back pain, gait problem, neck pain and neck stiffness.  Skin: Negative for itching and rash.  Neurological: Negative for dizziness, extremity weakness, gait problem,  headaches, light-headedness and seizures.  Hematological: Negative for adenopathy. Does not bruise/bleed easily.  Psychiatric/Behavioral: Negative for confusion, depression and sleep disturbance. The patient is not nervous/anxious.     PHYSICAL EXAMINATION:  There were no vitals taken for this visit.  ECOG PERFORMANCE STATUS: {CHL ONC ECOG [id number]  Physical Exam  Constitutional: Oriented to person, place, and time and well-developed, well-nourished, and in no distress. No distress.  HENT:  Head: Normocephalic and atraumatic.  Mouth/Throat: Oropharynx is clear and moist. No oropharyngeal exudate.  Eyes: Conjunctivae are normal. Right eye exhibits no discharge. Left eye exhibits no discharge. No scleral icterus.  Neck: Normal range of motion. Neck supple.  Cardiovascular: Normal rate, regular rhythm, normal heart sounds and intact distal pulses.   Pulmonary/Chest: Effort normal and breath sounds normal. No respiratory distress. No wheezes. No rales.  Abdominal: Soft. Bowel sounds are normal. Exhibits no distension and no mass. There is no tenderness.  Musculoskeletal: Normal range of motion. Exhibits no edema.  Lymphadenopathy:    No cervical adenopathy.  Neurological: Alert and  oriented to person, place, and time. Exhibits normal muscle tone. Gait normal. Coordination normal.  Skin: Skin is warm and dry. No rash noted. Not diaphoretic. No erythema. No pallor.  Psychiatric: Mood, memory and judgment normal.  Vitals reviewed.  LABORATORY DATA: Lab Results  Component Value Date   WBC 3.0 (L) 12/04/2023   HGB 5.9 (LL) 12/04/2023   HCT 16.5 (L) 12/04/2023   MCV 76.7 (L) 12/04/2023   PLT 16 (L) 12/04/2023      Chemistry      Component Value Date/Time   NA 139 12/04/2023 1310   NA 142 10/29/2022 1158   NA 141 05/29/2017 0921   K 3.7 12/04/2023 1310   K 4.8 05/29/2017 0921   CL 104 12/04/2023 1310   CO2 26 12/04/2023 1310   CO2 28 05/29/2017 0921   BUN 15 12/04/2023 1310   BUN 21 10/29/2022 1158   BUN 13.5 05/29/2017 0921   CREATININE 1.21 (H) 12/04/2023 1310   CREATININE 0.8 05/29/2017 0921      Component Value Date/Time   CALCIUM  8.3 (L) 12/04/2023 1310   CALCIUM  9.5 05/29/2017 0921   ALKPHOS 69 12/04/2023 1310   ALKPHOS 104 05/29/2017 0921   AST 23 12/04/2023 1310   AST 17 05/29/2017 0921   ALT 14 12/04/2023 1310   ALT 20 05/29/2017 0921   BILITOT 0.5 12/04/2023 1310   BILITOT 0.40 05/29/2017 0921       RADIOGRAPHIC STUDIES:  DG Chest Portable 1 View Result Date: 12/04/2023 CLINICAL DATA:  Chest pressure EXAM: PORTABLE CHEST 1 VIEW COMPARISON:  August 28, 2023 FINDINGS: The heart size and mediastinal contours are within normal limits. Both lungs are clear. The visualized skeletal structures are unremarkable. No change right IJ Infuse-A-Port catheter IMPRESSION: No active disease. Electronically Signed   By: Fredrich Jefferson M.D.   On: 12/04/2023 16:10   ECHOCARDIOGRAM COMPLETE Result Date: 11/13/2023    ECHOCARDIOGRAM REPORT   Patient Name:   [name] Date of Exam: 11/13/2023 Medical Rec #:  [id number]        Height:       67.0 in Accession #:    [id number]       Weight:       167.1 lb Date of Birth:  [birth year]        BSA:          1.874  m Patient Age:    18 years  BP:           139/85 mmHg Patient Gender: F                HR:           79 bpm. Exam Location:  [facility] Procedure: 2D Echo, Cardiac Doppler and Color Doppler (Both Spectral and Color            Flow Doppler were utilized during procedure). Indications:    Syncope R55  History:        Patient has no prior history of Echocardiogram examinations.                 Signs/Symptoms:Syncope; Risk Factors:Hypertension, Diabetes and                 Dyslipidemia. Stage IV adenocarcinoma of lung, right [facility]),                 Metastatic cancer to brain [facility]).  Sonographer:    Denese Finn RCS Referring Phys: [id number] OLADAPO ADEFESO IMPRESSIONS  1. Left ventricular ejection fraction, by estimation, is 55 to 60%. The left ventricle has normal function. The left ventricle has no regional wall motion abnormalities. Left ventricular diastolic parameters were normal.  2. Right ventricular systolic function is normal. The right ventricular size is normal. There is normal pulmonary artery systolic pressure. The estimated right ventricular systolic pressure is 20.3 mmHg.  3. The mitral valve is degenerative. Trivial mitral valve regurgitation.  4. The aortic valve is tricuspid. There is mild calcification of the aortic valve. Aortic valve regurgitation is mild. Aortic valve sclerosis/calcification is present, without any evidence of aortic stenosis.  5. The inferior vena cava is normal in size with greater than 50% respiratory variability, suggesting right atrial pressure of 3 mmHg. Comparison(s): No prior Echocardiogram. FINDINGS  Left Ventricle: Left ventricular ejection fraction, by estimation, is 55 to 60%. The left ventricle has normal function. The left ventricle has no regional wall motion abnormalities. The left ventricular internal cavity size was normal in size. There is  borderline left ventricular hypertrophy. Left ventricular diastolic parameters were normal. Right Ventricle: The right  ventricular size is normal. No increase in right ventricular wall thickness. Right ventricular systolic function is normal. There is normal pulmonary artery systolic pressure. The tricuspid regurgitant velocity is 2.08 m/s, and  with an assumed right atrial pressure of 3 mmHg, the estimated right ventricular systolic pressure is 20.3 mmHg. Left Atrium: Left atrial size was normal in size. Right Atrium: Right atrial size was normal in size. Pericardium: There is no evidence of pericardial effusion. Mitral Valve: The mitral valve is degenerative in appearance. Mild mitral annular calcification. Trivial mitral valve regurgitation. Tricuspid Valve: The tricuspid valve is grossly normal. Tricuspid valve regurgitation is mild. Aortic Valve: The aortic valve is tricuspid. There is mild calcification of the aortic valve. There is mild to moderate aortic valve annular calcification. Aortic valve regurgitation is mild. Aortic regurgitation PHT measures 781 msec. Aortic valve sclerosis/calcification is present, without any evidence of aortic stenosis. Pulmonic Valve: The pulmonic valve was grossly normal. Pulmonic valve regurgitation is trivial. Aorta: The aortic root is normal in size and structure. Venous: The inferior vena cava is normal in size with greater than 50% respiratory variability, suggesting right atrial pressure of 3 mmHg. IAS/Shunts: No atrial level shunt detected by color flow Doppler. Additional Comments: 3D was performed not requiring image post processing on an independent workstation and was indeterminate.  LEFT VENTRICLE PLAX 2D  LVIDd:         5.10 cm   Diastology LVIDs:         3.40 cm   LV e' medial:    7.72 cm/s LV PW:         0.90 cm   LV E/e' medial:  12.5 LV IVS:        1.00 cm   LV e' lateral:   9.68 cm/s LVOT diam:     2.00 cm   LV E/e' lateral: 10.0 LV SV:         91 LV SV Index:   48 LVOT Area:     3.14 cm  RIGHT VENTRICLE RV S prime:     11.50 cm/s TAPSE (M-mode): 2.6 cm LEFT ATRIUM              Index        RIGHT ATRIUM           Index LA diam:        3.90 cm 2.08 cm/m   RA Area:     20.40 cm LA Vol (A2C):   52.9 ml 28.23 ml/m  RA Volume:   57.10 ml  30.48 ml/m LA Vol (A4C):   55.7 ml 29.73 ml/m LA Biplane Vol: 57.2 ml 30.53 ml/m  AORTIC VALVE LVOT Vmax:   121.00 cm/s LVOT Vmean:  77.000 cm/s LVOT VTI:    0.289 m AI PHT:      781 msec  AORTA Ao Root diam: 3.20 cm MITRAL VALVE               TRICUSPID VALVE MV Area (PHT): 4.15 cm    TR Peak grad:   17.3 mmHg MV Decel Time: 183 msec    TR Vmax:        208.00 cm/s MV E velocity: 96.40 cm/s MV A velocity: 94.30 cm/s  SHUNTS MV E/A ratio:  1.02        Systemic VTI:  0.29 m                            Systemic Diam: 2.00 cm Teddie Favre MD Electronically signed by Teddie Favre MD Signature Date/Time: 11/13/2023/3:41:13 PM    Final    US  Carotid Bilateral Result Date: 11/13/2023 CLINICAL DATA:  Syncope, thrombocytopenia, hypertension, hyperlipidemia, diabetes EXAM: BILATERAL CAROTID DUPLEX ULTRASOUND TECHNIQUE: Martina Sledge scale imaging, color Doppler and duplex ultrasound were performed of bilateral carotid and vertebral arteries in the neck. COMPARISON:  None available FINDINGS: Criteria: Quantification of carotid stenosis is based on velocity parameters that correlate the residual internal carotid diameter with NASCET-based stenosis levels, using the diameter of the distal internal carotid lumen as the denominator for stenosis measurement. The following velocity measurements were obtained: RIGHT ICA: 109/36 cm/sec CCA: 69/14 cm/sec SYSTOLIC ICA/CCA RATIO:  1.6 ECA: 77 cm/sec LEFT ICA: 143/42 cm/sec CCA: 74/14 cm/sec SYSTOLIC ICA/CCA RATIO:  1.9 ECA: 63 cm/sec RIGHT CAROTID ARTERY: Mild heterogeneous plaque of the right ICA origin. RIGHT VERTEBRAL ARTERY:  Antegrade flow. LEFT CAROTID ARTERY: Mildly elevated peak systolic and end-diastolic velocities of the distal left internal carotid artery are likely artifact given lack of significant atheromatous  plaque. LEFT VERTEBRAL ARTERY:  Antegrade flow. IMPRESSION: 1. Mild atheromatous plaque of the right ICA origin with Doppler measurements consistent with less than 50% stenosis. 2. Less than 50% stenosis of the left internal carotid artery. Electronically Signed   By: Elester Grim M.D.   On:  11/13/2023 11:34   CT Lumbar Spine Wo Contrast Result Date: 11/12/2023 CLINICAL DATA:  Back trauma, no prior imaging (Age >= 16y) h/o cancer with fall, metastatic lung cancer EXAM: CT LUMBAR SPINE WITHOUT CONTRAST TECHNIQUE: Multidetector CT imaging of the lumbar spine was performed without intravenous contrast administration. Multiplanar CT image reconstructions were also generated. RADIATION DOSE REDUCTION: This exam was performed according to the departmental dose-optimization program which includes automated exposure control, adjustment of the mA and/or kV according to patient size and/or use of iterative reconstruction technique. COMPARISON:  10/22/2023 FINDINGS: Segmentation: 5 lumbar type vertebrae. Alignment: Normal. Vertebrae: There is an acute minimally displaced fracture of the right L1 transverse process. No acute fracture identified. Vertebral body height is preserved. Modic changes noted within the adjoining L2-3 endplates. No suspicious lytic or blastic bone lesion. Paraspinal and other soft tissues: Negative. Disc levels: There is intervertebral disc space narrowing throughout the lumbar spine with associated vacuum disc phenomena at L2-3, L3-4, and L5-S1 in keeping with changes of moderate to severe degenerative disc disease, most severe at L2-3. Multifactorial central canal stenosis, severe at L2-3 and moderate at L1-2 and L3-4. Facet hypertrophy results in severe neuroforaminal narrowing on the left at L3-4 and L4-5 with probable abutment of the exiting L3-L4 nerve roots. Impingement of the left lateral recess at L3-4 and L4-5 also noted IMPRESSION: 1. Acute minimally displaced fracture of the right L1  transverse process. 2. No acute fracture or malalignment of the lumbar spine. 3. Multilevel degenerative disc disease and facet arthropathy resulting in multilevel central canal stenosis, severe at L2-3 and moderate at L1-2 and L3-4. 4. Severe neuroforaminal narrowing on the left at L3-4 and L4-5 with probable abutment of the exiting L3 and L4 nerve roots. If indicated, this could be better assessed with dedicated MRI examination Electronically Signed   By: Worthy Heads M.D.   On: 11/12/2023 23:45   CT Head Wo Contrast Result Date: 11/12/2023 CLINICAL DATA:  Head trauma, minor (Age >= 65y) h/o cancer having syncope EXAM: CT HEAD WITHOUT CONTRAST TECHNIQUE: Contiguous axial images were obtained from the base of the skull through the vertex without intravenous contrast. RADIATION DOSE REDUCTION: This exam was performed according to the departmental dose-optimization program which includes automated exposure control, adjustment of the mA and/or kV according to patient size and/or use of iterative reconstruction technique. COMPARISON:  10/07/2023 FINDINGS: Brain: Subtle areas of vasogenic edema are identified within the frontal lobes bilaterally related to the numerous intra-axial metastatic foci noted on prior MRI examination. These are not visualized, however, on the current examination due to changes in modality. No acute intracranial hemorrhage. No abnormal mass effect or midline shift. No abnormal extra-axial fluid collection. Ventricular size is normal. Cerebellum is unremarkable. Vascular: No hyperdense vessel or unexpected calcification. Skull: Right temporal parosteal osteoma is unchanged. No focal lytic lesion. No acute fracture. Sinuses/Orbits: No acute finding. Other: Mastoid air cells and middle ear cavities are clear. IMPRESSION: 1. No acute intracranial hemorrhage. 2. Subtle areas of vasogenic edema are identified within the frontal lobes bilaterally related to the numerous intra-axial metastatic  foci noted on prior MRI examination. These foci are not visualized, however, on the current examination due to changes in modality. Electronically Signed   By: Worthy Heads M.D.   On: 11/12/2023 23:31   DG HIP UNILAT WITH PELVIS 2-3 VIEWS RIGHT Result Date: 11/12/2023 CLINICAL DATA:  Fall EXAM: DG HIP (WITH OR WITHOUT PELVIS) 2-3V RIGHT COMPARISON:  None Available. FINDINGS: There is no evidence  of hip fracture or dislocation. There is no evidence of arthropathy or other focal bone abnormality. IMPRESSION: Negative. Electronically Signed   By: Tyron Gallon M.D.   On: 11/12/2023 23:31     ASSESSMENT/PLAN:  This is a very pleasant 67 year old Caucasian female with stage IV (T1a, N3, M1 C) non-small cell lung cancer.  She was initially diagnosed as a stage Ia non-small cell lung cancer, status post right upper lobectomy with lymph node dissection.  She was diagnosed in March 2018.  She was found to have evidence of disease recurrence and metastasis in December 2024.   When she was found to have metastatic disease, she had several abnormal lymph nodes throughout the chest including the hilar, mediastinal, supraclavicular, bilateral axillary region as well as upper abdominal lymphadenopathy.  There was also an increasing right adrenal nodule worrisome for adrenal metastasis.   She underwent ultrasound-guided biopsy of the axillary lymph node and the final pathology was consistent with metastatic adenocarcinoma of lung primary.  She is positive for K-ras G 12C mutation which can be used in the second line setting.   She also was found to have metastatic disease to the brain and underwent whole brain radiation in 08/16/23  The patient is here today to start the first dose of systemic chemotherapy with carboplatin  for AUC of 5, Alimta 500 Mg/M2 and Keytruda  200 Mg IV every 3 weeks.  First dose August 21, 2023.  Status post 5 cycles.  Patient has been struggling with pancytopenia requiring blood  transfusions and G-CSF injections.  The patient started maintenance treatment at her last appointment with Alimta 500 mg/m and Keytruda  200 mg IV. Despite this, she still continued to have significant cytopenias.   The patient was seen with Dr. Marguerita Shih today.  Labs were reviewed.  Recommend ***reduced dose of Alimta?  Weekly sample of blood bank?  Cough?  Scan   Iron  **  Will see the patient back for labs and a follow-up visit in 3 weeks.  We will continue to monitor her labs closely on a weekly basis.  ***stool cards.   I will arrange for a restaging CT scan of the CAP prior to her next cycle of treatment to assess treatment response.   The patient was advised to call immediately if she has any concerning symptoms in the interval. The patient voices understanding of current disease status and treatment options and is in agreement with the current care plan. All questions were answered. The patient knows to call the clinic with any problems, questions or concerns. We can certainly see the patient much sooner if necessary   No orders of the defined types were placed in this encounter.    I spent {CHL ONC TIME VISIT - QIONG:[id number]} counseling the patient face to face. The total time spent in the appointment was {CHL ONC TIME VISIT - MWNUU:[id number]}.  Celica Kotowski L Evalin Shawhan, PA-C 12/10/23

## 2023-12-11 ENCOUNTER — Other Ambulatory Visit

## 2023-12-11 ENCOUNTER — Telehealth: Payer: Self-pay | Admitting: *Deleted

## 2023-12-11 ENCOUNTER — Ambulatory Visit: Admitting: Internal Medicine

## 2023-12-11 ENCOUNTER — Encounter: Payer: Self-pay | Admitting: Internal Medicine

## 2023-12-11 ENCOUNTER — Encounter

## 2023-12-11 ENCOUNTER — Other Ambulatory Visit: Payer: Self-pay

## 2023-12-11 ENCOUNTER — Ambulatory Visit

## 2023-12-11 ENCOUNTER — Ambulatory Visit
Admission: RE | Admit: 2023-12-11 | Discharge: 2023-12-11 | Disposition: A | Discharge status: Home / Self Care | Source: Ambulatory Visit | Attending: Radiation Oncology | Admitting: Radiation Oncology

## 2023-12-11 ENCOUNTER — Encounter: Payer: Self-pay | Admitting: Radiation Oncology

## 2023-12-11 ENCOUNTER — Inpatient Hospital Stay

## 2023-12-11 ENCOUNTER — Ambulatory Visit: Payer: Medicare Other | Admitting: Radiation Oncology

## 2023-12-11 VITALS — BP 149/94 | HR 91 | Temp 97.8°F | Resp 18 | Ht 67.0 in | Wt 164.4 lb

## 2023-12-11 DIAGNOSIS — C7931 Secondary malignant neoplasm of brain: Secondary | ICD-10-CM

## 2023-12-11 DIAGNOSIS — C3491 Malignant neoplasm of unspecified part of right bronchus or lung: Secondary | ICD-10-CM

## 2023-12-11 NOTE — Progress Notes (Addendum)
 Radiation Oncology         [phone] ________________________________  Name: FANTASY DONALD MRN: [id number]  Date: 12/11/2023  DOB: Nov 27, 1955  Follow-Up Visit Note  Outpatient  CC: Koirala, Dibas, MD  Lanae Pinal, MD  Diagnosis and Prior Radiotherapy:    ICD-10-CM   1. Metastatic cancer to brain [facility])  C79.31        ==========DELIVERED PLANS==========  First Treatment Date: 2023-08-05 Last Treatment Date: 2023-08-16   Plan Name: Brain_HA Site: Brain Technique: IMRT Mode: Photon Dose Per Fraction: 3 Gy Prescribed Dose (Delivered / Prescribed): 30 Gy / 30 Gy Prescribed Fxs (Delivered / Prescribed): 10 / 10  Brain metastases from Stage IV NSCLC; s/p WBRT with hippocampal sparing completed on 08/16/2023  CHIEF COMPLAINT: Here for follow-up and surveillance of brain cancer and to review the results of her most recent brain MRI.   Narrative:  MRI of the brain on 12/06/2023 demonstrated numerous contrast-enhancing lesions throughout the cerebral hemispheres and the cerebellum, consistent with metastatic disease. There are no new lesions or lesions that have increased in size. Mild multifocal edema has improved, most notably in the frontal lobes.  Consistent with stable to improved disease.  Does the patient complain of any of the following: Headache: None Visual Changes: None Hearing Changes:  Nausea: None Vomiting: None Balance or coordination issues: Dizziness upon standing  Memory issues: Some forgetfulness   Is the patient currently on a Decadron  regimen? : Yes, gets a dose of decadron  during chemotherapy treatment. Additional comments if applicable:   Constipation, encouraged to eat high fiber foods and  drink plenty of water        Left handed  Got her vision checked recently, will adjust glasses.  Current regimen in med onc: Alimta and Keytruda  every three weeks               ALLERGIES:  is allergic to synthroid [levothyroxine sodium], voltaren   [diclofenac  sodium], hydrochlorothiazide, and iodine.  Meds: Current Outpatient Medications  Medication Sig Dispense Refill   ACCU-CHEK AVIVA PLUS test strip USE TO MONITOR GLUCOSE LEVELS TWICE A DAY E11.9 100 strip 12   ACCU-CHEK SOFTCLIX LANCETS lancets Use to monitor glucose levels BID; E11.9 (Patient taking differently: 1 each by Other route 2 (two) times daily. E11.9) 100 each 12   acetaminophen  (TYLENOL ) 500 MG tablet Take 1 tablet (500 mg total) by mouth every 6 (six) hours as needed. (Patient taking differently: Take 500 mg by mouth every 6 (six) hours as needed (for pain).) 30 tablet 0   atorvastatin  (LIPITOR) 20 MG tablet Take 20 mg by mouth daily.  11   Blood Glucose Calibration (GLUCOSE CONTROL) SOLN 1 Bottle by In Vitro route as needed. Use to calibrate Accu-Chek Aviva Plus device prn; please provide control solution compatible with Accu-Chek Aviva Plus device. (Patient taking differently: 1 each by Other route as needed (Use to calibrate glucometer). E11.9) 1 each 1   Blood Glucose Monitoring Suppl (ACCU-CHEK AVIVA PLUS) w/Device KIT 1 each by Does not apply route 2 (two) times daily. Use to monitor glucose levels BID; E11.9 (Patient taking differently: 1 each by Does not apply route 2 (two) times daily. E11.9) 1 kit 0   clotrimazole  (MYCELEX ) 10 MG troche Take 10 mg by mouth 5 (five) times daily.     escitalopram  (LEXAPRO ) 10 MG tablet Take 1 tablet (10 mg total) by mouth every morning. 90 tablet 3   ferrous sulfate  325 (65 FE) MG tablet Take 1 tablet (325 mg total)  by mouth 2 (two) times daily with a meal. 60 tablet 0   folic acid  (FOLVITE ) 1 MG tablet Take 1 tablet (1 mg total) by mouth daily. Start 7 days before pemetrexed  chemotherapy. Continue until 21 days after pemetrexed  completed. 100 tablet 3   insulin  glargine (LANTUS  SOLOSTAR) 100 UNIT/ML Solostar Pen Inject 15 Units into the skin at bedtime. (Patient taking differently: Inject 15 Units into the skin at bedtime as needed  (high bs).) 15 mL 3   Insulin  Pen Needle (B-D ULTRAFINE III SHORT PEN) 31G X 8 MM MISC USE AS DIRECTED 100 each 5   lidocaine  (XYLOCAINE ) 2 % solution every 6 (six) hours as needed for mouth pain. Apply topically to mouth for sores     lidocaine -prilocaine  (EMLA ) cream Apply to affected area once (Patient taking differently: Apply 1 Application topically once. Apply to affected area once) 30 g 3   magic mouthwash SOLN Take 5 mLs by mouth 3 (three) times daily as needed for mouth pain. Suspension contains equal amounts of Maalox Extra Strength, nystatin, and diphenhydramine .     memantine  (NAMENDA ) 10 MG tablet Take 1 tablet (10 mg total) by mouth 2 (two) times daily. Take through 01-20-24, then stop. 180 tablet 0   ondansetron  (ZOFRAN ) 8 MG tablet Take 1 tablet (8 mg total) by mouth every 8 (eight) hours as needed for nausea or vomiting. Start on the third day after carboplatin . 30 tablet 1   pantoprazole  (PROTONIX ) 40 MG tablet TAKE 1 TABLET BY MOUTH TWICE A DAY 30 MINS BEFORE YOUR FIRST AND LAST MEAL. 180 tablet 1   prochlorperazine  (COMPAZINE ) 10 MG tablet Take 1 tablet (10 mg total) by mouth every 6 (six) hours as needed for nausea or vomiting. 30 tablet 1   traZODone  (DESYREL ) 100 MG tablet Take 2 tablets (200 mg total) by mouth at bedtime. 180 tablet 3   No current facility-administered medications for this encounter.    Physical Findings:   The patient is in no acute distress. Patient is alert and oriented.  height is 5\' 7"  (1.702 m) and weight is 164 lb 6.4 oz (74.6 kg). Her temperature is 97.8 F (36.6 C). Her blood pressure is 149/94 (abnormal) and her pulse is 91. Her respiration is 18 and oxygen saturation is 100%. .    General: Alert and oriented, in no acute distress HEENT: Alopecia.  There is a protuberance over the right skull with the patient reports is chronic from childhood Extraocular movements are intact. Oropharynx is clear. Heart: Regular in rate and rhythm with no murmurs,  rubs, or gallops. Chest: Good airflow bilaterally Abdomen: Soft, nontender, nondistended, with no rigidity or guarding. Extremities: No cyanosis or edema.  Skin: No concerning lesions. Musculoskeletal: symmetric strength and muscle tone throughout. Neurologic: Cranial nerves II through XII are grossly intact. No obvious focalities. Speech is fluent. Coordination is intact. Psychiatric: Judgment and insight are intact. Affect is appropriate.  ECOG = 1  0 - Asymptomatic (Fully active, able to carry on all predisease activities without restriction)  1 - Symptomatic but completely ambulatory (Restricted in physically strenuous activity but ambulatory and able to carry out work of a light or sedentary nature. For example, light housework, office work)  2 - Symptomatic, <50% in bed during the day (Ambulatory and capable of all self care but unable to carry out any work activities. Up and about more than 50% of waking hours)  3 - Symptomatic, >50% in bed, but not bedbound (Capable of only limited self-care,  confined to bed or chair 50% or more of waking hours)  4 - Bedbound (Completely disabled. Cannot carry on any self-care. Totally confined to bed or chair)  5 - Death   Aurea Blossom MM, Creech RH, Tormey DC, et al. [phone]). "Toxicity and response criteria of the [facility]". Am. Hillard Lowes. Oncol. 5 (6): 649-55  KPS = 80  100 - Normal; no complaints; no evidence of disease. 90   - Able to carry on normal activity; minor signs or symptoms of disease. 80   - Normal activity with effort; some signs or symptoms of disease. 18   - Cares for self; unable to carry on normal activity or to do active work. 60   - Requires occasional assistance, but is able to care for most of his personal needs. 50   - Requires considerable assistance and frequent medical care. 40   - Disabled; requires special care and assistance. 30   - Severely disabled; hospital admission is indicated although  death not imminent. 20   - Very sick; hospital admission necessary; active supportive treatment necessary. 10   - Moribund; fatal processes progressing rapidly. 0     - Dead  Karnofsky DA, Abelmann WH, Craver LS and Burchenal [facility] [phone]) The use of the nitrogen mustards in the palliative treatment of carcinoma: with particular reference to bronchogenic carcinoma Cancer 1 [phone]     Lab Findings: Lab Results  Component Value Date   WBC 3.0 (L) 12/04/2023   HGB 5.9 (LL) 12/04/2023   HCT 16.5 (L) 12/04/2023   MCV 76.7 (L) 12/04/2023   PLT 16 (L) 12/04/2023    Radiographic Findings: MR Brain W Wo Contrast Result Date: 12/11/2023 CLINICAL DATA:  Brain neoplasm EXAM: MRI HEAD WITHOUT AND WITH CONTRAST TECHNIQUE: Multiplanar, multiecho pulse sequences of the brain and surrounding structures were obtained without and with intravenous contrast. CONTRAST:  7.1mL GADAVIST  GADOBUTROL  1 MMOL/ML IV SOLN COMPARISON:  10/07/2023 FINDINGS: Brain: Numerous scattered foci of diffusion restriction, as previously seen. No acute infarct. No acute or chronic hemorrhage. There again demonstrated numerous contrast-enhancing lesions throughout the cerebral hemispheres and the cerebellum. There are at least 20 lesions, which are annotated on the re-formatted series 601 (axial postcontrast T1-weighted imaging). The largest lesion remains in the anterior right frontal lobe and now measures 10 mm, previously 13 mm. There are no new lesions or lesions that have increased in size. Mild multifocal edema has improved, most notably in the frontal lobes. Vascular: Normal flow voids. Skull and upper cervical spine: Trace mastoid fluid bilaterally. Paranasal sinuses are clear. Normal orbits. Sinuses/Orbits:No paranasal sinus fluid levels or advanced mucosal thickening. No mastoid or middle ear effusion. Normal orbits. IMPRESSION: 1. Numerous contrast-enhancing lesions throughout the cerebral hemispheres and the cerebellum, consistent  with metastatic disease. The largest lesion remains in the anterior right frontal lobe and now measures 10 mm, previously 13 mm. 2. No new lesions or lesions that have increased in size. No progression of metastatic disease. 3. Mild multifocal edema has improved, most notably in the frontal lobes. Electronically Signed   By: Juanetta Nordmann M.D.   On: 12/11/2023 03:14   DG Chest Portable 1 View Result Date: 12/04/2023 CLINICAL DATA:  Chest pressure EXAM: PORTABLE CHEST 1 VIEW COMPARISON:  August 28, 2023 FINDINGS: The heart size and mediastinal contours are within normal limits. Both lungs are clear. The visualized skeletal structures are unremarkable. No change right IJ Infuse-A-Port catheter IMPRESSION: No active disease. Electronically Signed   By: Ernestina Headland  Anette Barb M.D.   On: 12/04/2023 16:10   ECHOCARDIOGRAM COMPLETE Result Date: 11/13/2023    ECHOCARDIOGRAM REPORT   Patient Name:   DESSIREE SZE Date of Exam: 11/13/2023 Medical Rec #:  [id number]        Height:       67.0 in Accession #:    [id number]       Weight:       167.1 lb Date of Birth:  17-Aug-1955        BSA:          1.874 m Patient Age:    67 years         BP:           139/85 mmHg Patient Gender: F                HR:           79 bpm. Exam Location:  [facility] Procedure: 2D Echo, Cardiac Doppler and Color Doppler (Both Spectral and Color            Flow Doppler were utilized during procedure). Indications:    Syncope R55  History:        Patient has no prior history of Echocardiogram examinations.                 Signs/Symptoms:Syncope; Risk Factors:Hypertension, Diabetes and                 Dyslipidemia. Stage IV adenocarcinoma of lung, right [facility]),                 Metastatic cancer to brain [facility]).  Sonographer:    Denese Finn RCS Referring Phys: [id number] OLADAPO ADEFESO IMPRESSIONS  1. Left ventricular ejection fraction, by estimation, is 55 to 60%. The left ventricle has normal function. The left ventricle has no regional wall motion  abnormalities. Left ventricular diastolic parameters were normal.  2. Right ventricular systolic function is normal. The right ventricular size is normal. There is normal pulmonary artery systolic pressure. The estimated right ventricular systolic pressure is 20.3 mmHg.  3. The mitral valve is degenerative. Trivial mitral valve regurgitation.  4. The aortic valve is tricuspid. There is mild calcification of the aortic valve. Aortic valve regurgitation is mild. Aortic valve sclerosis/calcification is present, without any evidence of aortic stenosis.  5. The inferior vena cava is normal in size with greater than 50% respiratory variability, suggesting right atrial pressure of 3 mmHg. Comparison(s): No prior Echocardiogram. FINDINGS  Left Ventricle: Left ventricular ejection fraction, by estimation, is 55 to 60%. The left ventricle has normal function. The left ventricle has no regional wall motion abnormalities. The left ventricular internal cavity size was normal in size. There is  borderline left ventricular hypertrophy. Left ventricular diastolic parameters were normal. Right Ventricle: The right ventricular size is normal. No increase in right ventricular wall thickness. Right ventricular systolic function is normal. There is normal pulmonary artery systolic pressure. The tricuspid regurgitant velocity is 2.08 m/s, and  with an assumed right atrial pressure of 3 mmHg, the estimated right ventricular systolic pressure is 20.3 mmHg. Left Atrium: Left atrial size was normal in size. Right Atrium: Right atrial size was normal in size. Pericardium: There is no evidence of pericardial effusion. Mitral Valve: The mitral valve is degenerative in appearance. Mild mitral annular calcification. Trivial mitral valve regurgitation. Tricuspid Valve: The tricuspid valve is grossly normal. Tricuspid valve regurgitation is mild. Aortic Valve: The aortic valve is tricuspid. There is mild  calcification of the aortic valve. There is  mild to moderate aortic valve annular calcification. Aortic valve regurgitation is mild. Aortic regurgitation PHT measures 781 msec. Aortic valve sclerosis/calcification is present, without any evidence of aortic stenosis. Pulmonic Valve: The pulmonic valve was grossly normal. Pulmonic valve regurgitation is trivial. Aorta: The aortic root is normal in size and structure. Venous: The inferior vena cava is normal in size with greater than 50% respiratory variability, suggesting right atrial pressure of 3 mmHg. IAS/Shunts: No atrial level shunt detected by color flow Doppler. Additional Comments: 3D was performed not requiring image post processing on an independent workstation and was indeterminate.  LEFT VENTRICLE PLAX 2D LVIDd:         5.10 cm   Diastology LVIDs:         3.40 cm   LV e' medial:    7.72 cm/s LV PW:         0.90 cm   LV E/e' medial:  12.5 LV IVS:        1.00 cm   LV e' lateral:   9.68 cm/s LVOT diam:     2.00 cm   LV E/e' lateral: 10.0 LV SV:         91 LV SV Index:   48 LVOT Area:     3.14 cm  RIGHT VENTRICLE RV S prime:     11.50 cm/s TAPSE (M-mode): 2.6 cm LEFT ATRIUM             Index        RIGHT ATRIUM           Index LA diam:        3.90 cm 2.08 cm/m   RA Area:     20.40 cm LA Vol (A2C):   52.9 ml 28.23 ml/m  RA Volume:   57.10 ml  30.48 ml/m LA Vol (A4C):   55.7 ml 29.73 ml/m LA Biplane Vol: 57.2 ml 30.53 ml/m  AORTIC VALVE LVOT Vmax:   121.00 cm/s LVOT Vmean:  77.000 cm/s LVOT VTI:    0.289 m AI PHT:      781 msec  AORTA Ao Root diam: 3.20 cm MITRAL VALVE               TRICUSPID VALVE MV Area (PHT): 4.15 cm    TR Peak grad:   17.3 mmHg MV Decel Time: 183 msec    TR Vmax:        208.00 cm/s MV E velocity: 96.40 cm/s MV A velocity: 94.30 cm/s  SHUNTS MV E/A ratio:  1.02        Systemic VTI:  0.29 m                            Systemic Diam: 2.00 cm Teddie Favre MD Electronically signed by Teddie Favre MD Signature Date/Time: 11/13/2023/3:41:13 PM    Final    US  Carotid  Bilateral Result Date: 11/13/2023 CLINICAL DATA:  Syncope, thrombocytopenia, hypertension, hyperlipidemia, diabetes EXAM: BILATERAL CAROTID DUPLEX ULTRASOUND TECHNIQUE: Martina Sledge scale imaging, color Doppler and duplex ultrasound were performed of bilateral carotid and vertebral arteries in the neck. COMPARISON:  None available FINDINGS: Criteria: Quantification of carotid stenosis is based on velocity parameters that correlate the residual internal carotid diameter with NASCET-based stenosis levels, using the diameter of the distal internal carotid lumen as the denominator for stenosis measurement. The following velocity measurements were obtained: RIGHT ICA: 109/36 cm/sec CCA: 69/14 cm/sec SYSTOLIC  ICA/CCA RATIO:  1.6 ECA: 77 cm/sec LEFT ICA: 143/42 cm/sec CCA: 74/14 cm/sec SYSTOLIC ICA/CCA RATIO:  1.9 ECA: 63 cm/sec RIGHT CAROTID ARTERY: Mild heterogeneous plaque of the right ICA origin. RIGHT VERTEBRAL ARTERY:  Antegrade flow. LEFT CAROTID ARTERY: Mildly elevated peak systolic and end-diastolic velocities of the distal left internal carotid artery are likely artifact given lack of significant atheromatous plaque. LEFT VERTEBRAL ARTERY:  Antegrade flow. IMPRESSION: 1. Mild atheromatous plaque of the right ICA origin with Doppler measurements consistent with less than 50% stenosis. 2. Less than 50% stenosis of the left internal carotid artery. Electronically Signed   By: Elester Grim M.D.   On: 11/13/2023 11:34   CT Lumbar Spine Wo Contrast Result Date: 11/12/2023 CLINICAL DATA:  Back trauma, no prior imaging (Age >= 16y) h/o cancer with fall, metastatic lung cancer EXAM: CT LUMBAR SPINE WITHOUT CONTRAST TECHNIQUE: Multidetector CT imaging of the lumbar spine was performed without intravenous contrast administration. Multiplanar CT image reconstructions were also generated. RADIATION DOSE REDUCTION: This exam was performed according to the departmental dose-optimization program which includes automated exposure  control, adjustment of the mA and/or kV according to patient size and/or use of iterative reconstruction technique. COMPARISON:  10/22/2023 FINDINGS: Segmentation: 5 lumbar type vertebrae. Alignment: Normal. Vertebrae: There is an acute minimally displaced fracture of the right L1 transverse process. No acute fracture identified. Vertebral body height is preserved. Modic changes noted within the adjoining L2-3 endplates. No suspicious lytic or blastic bone lesion. Paraspinal and other soft tissues: Negative. Disc levels: There is intervertebral disc space narrowing throughout the lumbar spine with associated vacuum disc phenomena at L2-3, L3-4, and L5-S1 in keeping with changes of moderate to severe degenerative disc disease, most severe at L2-3. Multifactorial central canal stenosis, severe at L2-3 and moderate at L1-2 and L3-4. Facet hypertrophy results in severe neuroforaminal narrowing on the left at L3-4 and L4-5 with probable abutment of the exiting L3-L4 nerve roots. Impingement of the left lateral recess at L3-4 and L4-5 also noted IMPRESSION: 1. Acute minimally displaced fracture of the right L1 transverse process. 2. No acute fracture or malalignment of the lumbar spine. 3. Multilevel degenerative disc disease and facet arthropathy resulting in multilevel central canal stenosis, severe at L2-3 and moderate at L1-2 and L3-4. 4. Severe neuroforaminal narrowing on the left at L3-4 and L4-5 with probable abutment of the exiting L3 and L4 nerve roots. If indicated, this could be better assessed with dedicated MRI examination Electronically Signed   By: Worthy Heads M.D.   On: 11/12/2023 23:45   CT Head Wo Contrast Result Date: 11/12/2023 CLINICAL DATA:  Head trauma, minor (Age >= 65y) h/o cancer having syncope EXAM: CT HEAD WITHOUT CONTRAST TECHNIQUE: Contiguous axial images were obtained from the base of the skull through the vertex without intravenous contrast. RADIATION DOSE REDUCTION: This exam was  performed according to the departmental dose-optimization program which includes automated exposure control, adjustment of the mA and/or kV according to patient size and/or use of iterative reconstruction technique. COMPARISON:  10/07/2023 FINDINGS: Brain: Subtle areas of vasogenic edema are identified within the frontal lobes bilaterally related to the numerous intra-axial metastatic foci noted on prior MRI examination. These are not visualized, however, on the current examination due to changes in modality. No acute intracranial hemorrhage. No abnormal mass effect or midline shift. No abnormal extra-axial fluid collection. Ventricular size is normal. Cerebellum is unremarkable. Vascular: No hyperdense vessel or unexpected calcification. Skull: Right temporal parosteal osteoma is unchanged. No focal lytic  lesion. No acute fracture. Sinuses/Orbits: No acute finding. Other: Mastoid air cells and middle ear cavities are clear. IMPRESSION: 1. No acute intracranial hemorrhage. 2. Subtle areas of vasogenic edema are identified within the frontal lobes bilaterally related to the numerous intra-axial metastatic foci noted on prior MRI examination. These foci are not visualized, however, on the current examination due to changes in modality. Electronically Signed   By: Worthy Heads M.D.   On: 11/12/2023 23:31   DG HIP UNILAT WITH PELVIS 2-3 VIEWS RIGHT Result Date: 11/12/2023 CLINICAL DATA:  Fall EXAM: DG HIP (WITH OR WITHOUT PELVIS) 2-3V RIGHT COMPARISON:  None Available. FINDINGS: There is no evidence of hip fracture or dislocation. There is no evidence of arthropathy or other focal bone abnormality. IMPRESSION: Negative. Electronically Signed   By: Tyron Gallon M.D.   On: 11/12/2023 23:31    Impression/Plan:  Left-handed woman with h/o  Brain metastases from Stage IV NSCLC; s/p WBRT completed on 08/16/2023 - good disease control, no progression  No need for ophthalmology exam at present.  Doing well overall.  Discussed precautions r/t driving.  Denies syncope for past 4 weeks.  Staying hydrated, follows closely w/medonc  MRI brain to be repeated in 54mo. F/u w rad onc at that time  Continue Memantine  through 01-20-24, then stop  On date of service, in total, I spent 30 minutes on this encounter. Patient was seen in person.     _____________________________________   Colie Dawes, MD

## 2023-12-11 NOTE — Telephone Encounter (Signed)
 CALLED PATIENT TO INFORM OF MRI FOR 02-12-24- ARRIVAL TIME- 10:30 AM @ [facility], NO RESTRICTIONS TO SCAN, LVM FOR A RETURN CALL

## 2023-12-11 NOTE — Research (Signed)
 A PHASE III TRIAL OF STEREOTACTIC RADIOSURGERY COMPARED WITH HIPPOCAMPAL-AVOIDANT WHOLE BRAIN RADIOTHERAPY (HA-WBRT) PLUS MEMANTINE  FOR 5 OR MORE BRAIN METASTASES    Patient arrives today Unaccompanied for the 4 month visit.    PROs: PROs required for this visit were completed prior to other study activities and completeness has been verified by this RN.  This RN reviewed for completeness.  One correction was made by patient; patient initialed and dated the change.   LABS: Collected prior to MRI.   MEDICATION REVIEW: Patient reports recently stopping the use of Voltaren  gel, last date of use 12/04/23, previously listed as a medication.  This was removed from her medication list prior to this visit.  No other medication changes reported.  VITAL SIGNS: Vital signs were collected as part of patient's visit with Dr. [name].  She encounter with Dr. [name] for documentation of vital signs.  MD/PROVIDER VISIT: Patient sees Dr. [name] for today's visit; see that encounter for her documentation.   ADVERSE EVENTS: [name] reports ongoing feeling of dizziness; nausea has resolved; dysgeusia continues, and ongoing back pain related to spinal fracture.  She reports uses tylenol  for back pain management, and relays the pain is manageable.  Patient reports new adverse event of constipation, which started the day after meeting with nutritionist.  Patient is not taking medication to manage her constipation; she is increasing her fiber intake, as well as drinking prune juice and hot tea.  Adverse Event Log  Event Grade Onset Date Resolved Date Attribution Treatment Comments  Fall Grade 1 10/15/23 11/12/23 Unrelated  Syncopal Event 11/12/23  Anorexia Grade 2 10/02/23 ongoing Unrelated  Patient met with Registered Dietician in April  Nausea Grade 2 10/02/23 12/11/23 Unrelated  Patient reports resolution of nausea  Alopecia Grade 2 10/02/23 ongoing Unrelated    Dysgeusia Grade 2 10/02/23 ongoing Unrelated     Anemia Grade 1 08/21/23 11/11/23 Unrelated  Worsened; see new log line (pancytopenia)  Pancytopenia Grade 3 11/12/23 ongoing Unrelated See documentation from November 14, 2023 and Dec 04, 2023 Inclusive of Anemia, white blood cells decreased, platelet count decreased, and neutrophil count decreased  Creatinine Increased Grade 1 10/08/23 ongoing Unrelated    Syncope Grade 3 11/12/23 11/12/23 Unlikely  Denies further syncopal events  Dizziness Grade 1 11/12/23 ongoing Unrelated  Dizziness upon standing; patient rises slowly from seated position.  States this has been ongoing since syncopal episode.  Spine fracture Grade 3 11/12/23 11/14/23 Unrelated  Discharged 11/14/23; no prescription pain medication prescribed at discharge.  Spine fracture Grade 1 11/15/23  Ongoing Unrelated OTC Tylenol  PRN   Back Pain Grade 3 11/12/23 11/14/23 Unrelated  Discharged 11/14/23; no prescription pain medication prescribed at discharge.  Back Pain Grade 1 11/15/23 ongoing Unrelated OTC Tylenol  PRN   Constipation Grade 1 11/20/23 ongoing Unrelated  Dietary modification only at this time; aware of OTC options, if needed.  Denies current use of medication to manage.    NEUROCOGNITIVE ASSESSMENT: Neurocognitive assessment is completed by Naoma Bacca.  DISPOSITION:  Patient is informed her next visit will take place in July.  She will be out of town for a portion of the month, so we will plan around her time away.  The patient was thanked for their time and continued voluntary participation in this study. [name] has been provided direct contact information and is encouraged to contact [facility] for any study needs or questions.  Dagmar Drones, RN, BSN, [facility] 12/11/2023 12:41 PM

## 2023-12-12 ENCOUNTER — Inpatient Hospital Stay

## 2023-12-12 ENCOUNTER — Ambulatory Visit

## 2023-12-12 ENCOUNTER — Inpatient Hospital Stay: Admitting: Internal Medicine

## 2023-12-12 ENCOUNTER — Other Ambulatory Visit

## 2023-12-12 ENCOUNTER — Inpatient Hospital Stay (HOSPITAL_BASED_OUTPATIENT_CLINIC_OR_DEPARTMENT_OTHER): Admitting: Physician Assistant

## 2023-12-12 ENCOUNTER — Ambulatory Visit: Admitting: Internal Medicine

## 2023-12-12 VITALS — BP 153/63 | HR 78 | Temp 97.3°F | Resp 13 | Wt 166.2 lb

## 2023-12-12 DIAGNOSIS — C3491 Malignant neoplasm of unspecified part of right bronchus or lung: Secondary | ICD-10-CM | POA: Diagnosis not present

## 2023-12-12 DIAGNOSIS — Z95828 Presence of other vascular implants and grafts: Secondary | ICD-10-CM

## 2023-12-12 DIAGNOSIS — Z5111 Encounter for antineoplastic chemotherapy: Secondary | ICD-10-CM | POA: Diagnosis not present

## 2023-12-12 DIAGNOSIS — Z5112 Encounter for antineoplastic immunotherapy: Secondary | ICD-10-CM

## 2023-12-12 DIAGNOSIS — D649 Anemia, unspecified: Secondary | ICD-10-CM | POA: Diagnosis not present

## 2023-12-12 LAB — CBC WITH DIFFERENTIAL (CANCER CENTER ONLY)
Abs Immature Granulocytes: 0.05 10*3/uL (ref 0.00–0.07)
Basophils Absolute: 0 10*3/uL (ref 0.0–0.1)
Basophils Relative: 0 %
Eosinophils Absolute: 0 10*3/uL (ref 0.0–0.5)
Eosinophils Relative: 1 %
HCT: 22 % — ABNORMAL LOW (ref 36.0–46.0)
Hemoglobin: 7.7 g/dL — ABNORMAL LOW (ref 12.0–15.0)
Immature Granulocytes: 1 %
Lymphocytes Relative: 20 %
Lymphs Abs: 1.1 10*3/uL (ref 0.7–4.0)
MCH: 28.9 pg (ref 26.0–34.0)
MCHC: 35 g/dL (ref 30.0–36.0)
MCV: 82.7 fL (ref 80.0–100.0)
Monocytes Absolute: 0.7 10*3/uL (ref 0.1–1.0)
Monocytes Relative: 12 %
Neutro Abs: 3.6 10*3/uL (ref 1.7–7.7)
Neutrophils Relative %: 66 %
Platelet Count: 159 10*3/uL (ref 150–400)
RBC: 2.66 MIL/uL — ABNORMAL LOW (ref 3.87–5.11)
RDW: 16.7 % — ABNORMAL HIGH (ref 11.5–15.5)
WBC Count: 5.5 10*3/uL (ref 4.0–10.5)
nRBC: 0 % (ref 0.0–0.2)

## 2023-12-12 LAB — CMP (CANCER CENTER ONLY)
ALT: 12 U/L (ref 0–44)
AST: 19 U/L (ref 15–41)
Albumin: 3.2 g/dL — ABNORMAL LOW (ref 3.5–5.0)
Alkaline Phosphatase: 70 U/L (ref 38–126)
Anion gap: 7 (ref 5–15)
BUN: 9 mg/dL (ref 8–23)
CO2: 28 mmol/L (ref 22–32)
Calcium: 8.2 mg/dL — ABNORMAL LOW (ref 8.9–10.3)
Chloride: 105 mmol/L (ref 98–111)
Creatinine: 1.2 mg/dL — ABNORMAL HIGH (ref 0.44–1.00)
GFR, Estimated: 49 mL/min — ABNORMAL LOW (ref >60)
Glucose, Bld: 139 mg/dL — ABNORMAL HIGH (ref 70–99)
Potassium: 3.6 mmol/L (ref 3.5–5.1)
Sodium: 140 mmol/L (ref 135–145)
Total Bilirubin: 0.5 mg/dL (ref 0.0–1.2)
Total Protein: 5.9 g/dL — ABNORMAL LOW (ref 6.5–8.1)

## 2023-12-12 LAB — PREPARE RBC (CROSSMATCH)

## 2023-12-12 LAB — SAMPLE TO BLOOD BANK

## 2023-12-12 LAB — TSH: TSH: 1.6 u[IU]/mL (ref 0.350–4.500)

## 2023-12-12 MED ORDER — SODIUM CHLORIDE 0.9% FLUSH
10.0000 mL | INTRAVENOUS | Status: DC | PRN
  Administered 2023-12-12: 10 mL

## 2023-12-12 MED ORDER — SODIUM CHLORIDE 0.9 % IV SOLN
INTRAVENOUS | Status: DC
Start: 2023-12-12 — End: 2023-12-12

## 2023-12-12 MED ORDER — HEPARIN SOD (PORK) LOCK FLUSH 100 UNIT/ML IV SOLN
500.0000 [IU] | Freq: Once | INTRAVENOUS | Status: AC | PRN
Start: 2023-12-12 — End: 2023-12-12
  Administered 2023-12-12: 500 [IU]

## 2023-12-12 MED ORDER — PROCHLORPERAZINE MALEATE 10 MG PO TABS
10.0000 mg | ORAL_TABLET | Freq: Once | ORAL | Status: AC
  Administered 2023-12-12: 10 mg via ORAL
  Filled 2023-12-12: qty 1

## 2023-12-12 MED ORDER — SODIUM CHLORIDE 0.9 % IV SOLN
200.0000 mg | Freq: Once | INTRAVENOUS | Status: AC
  Administered 2023-12-12: 200 mg via INTRAVENOUS
  Filled 2023-12-12: qty 200

## 2023-12-12 MED ORDER — CYANOCOBALAMIN 1000 MCG/ML IJ SOLN
1000.0000 ug | Freq: Once | INTRAMUSCULAR | Status: AC
  Administered 2023-12-12: 1000 ug via INTRAMUSCULAR
  Filled 2023-12-12: qty 1

## 2023-12-12 MED ORDER — SODIUM CHLORIDE 0.9% FLUSH
10.0000 mL | Freq: Once | INTRAVENOUS | Status: AC
  Administered 2023-12-12: 10 mL

## 2023-12-12 NOTE — Patient Instructions (Signed)

## 2023-12-13 ENCOUNTER — Inpatient Hospital Stay

## 2023-12-13 DIAGNOSIS — D649 Anemia, unspecified: Secondary | ICD-10-CM

## 2023-12-13 DIAGNOSIS — Z5111 Encounter for antineoplastic chemotherapy: Secondary | ICD-10-CM | POA: Diagnosis not present

## 2023-12-13 LAB — T4: T4, Total: 9.7 ug/dL (ref 4.5–12.0)

## 2023-12-13 MED ORDER — DIPHENHYDRAMINE HCL 25 MG PO CAPS
25.0000 mg | ORAL_CAPSULE | Freq: Once | ORAL | Status: AC
Start: 2023-12-13 — End: 2023-12-13
  Administered 2023-12-13: 25 mg via ORAL
  Filled 2023-12-13: qty 1

## 2023-12-13 MED ORDER — ACETAMINOPHEN 325 MG PO TABS
650.0000 mg | ORAL_TABLET | Freq: Once | ORAL | Status: AC
  Administered 2023-12-13: 650 mg via ORAL
  Filled 2023-12-13: qty 2

## 2023-12-13 MED ORDER — SODIUM CHLORIDE 0.9% FLUSH
10.0000 mL | INTRAVENOUS | Status: AC | PRN
Start: 2023-12-13 — End: 2023-12-13
  Administered 2023-12-13: 10 mL

## 2023-12-13 MED ORDER — SODIUM CHLORIDE 0.9% IV SOLUTION
250.0000 mL | INTRAVENOUS | Status: DC
  Administered 2023-12-13: 100 mL via INTRAVENOUS

## 2023-12-13 MED ORDER — HEPARIN SOD (PORK) LOCK FLUSH 100 UNIT/ML IV SOLN
250.0000 [IU] | INTRAVENOUS | Status: AC | PRN
  Administered 2023-12-13: 500 [IU]

## 2023-12-13 NOTE — Patient Instructions (Signed)
 Blood Transfusion, Adult A blood transfusion is a procedure in which you receive blood or a type of blood cell (blood component) through an IV. You may need a blood transfusion when you have a low blood count, which is a low number of any blood cell. This may result from a bleeding disorder, illness, injury, or surgery. The blood may come from a donor, or you may be able to have your own blood collected and stored (autologous blood donation) before a planned surgery. The blood given in a transfusion may be made up of different blood components. You may receive: Red blood cells. These carry oxygen to the cells in the body. Platelets. These help your blood to clot. Plasma. This is the liquid part of your blood. It carries proteins and other substances throughout the body. White blood cells. These help you fight infections. If you have hemophilia or another clotting disorder, you may also receive other types of blood products. Depending on the type of blood product, this procedure may take 1-4 hours to complete. Tell a health care provider about: Any bleeding problems you have. Any previous reactions you have had during a blood transfusion. Any allergies you have. All medicines you are taking, including vitamins, herbs, eye drops, creams, and over-the-counter medicines. Any surgeries you have had. Any medical conditions you have. Whether you are pregnant or may be pregnant. What are the risks? Talk with your health care provider about risks. The most common problems include: A mild allergic reaction, such as red, swollen areas of skin (hives) and itching. Fever or chills. This may be the body's response to new blood cells received. This may occur during or up to 4 hours after the transfusion. More serious problems may include: A serious allergic reaction that causes difficulty breathing or swelling around the face and lips. Transfusion-associated circulatory overload (TACO), or too much fluid in  the lungs. This may cause breathing problems. Transfusion-related acute lung injury (TRALI), which causes breathing difficulty and low oxygen in the blood. This can occur within hours of the transfusion or several days later. Iron overload. This can happen after receiving many blood transfusions over a period of time. Infection or virus being transmitted. This is rare because donated blood is carefully tested before it is given. Hemolytic transfusion reaction. This is rare. It happens when the body's defense system (immune system)tries to attack the new blood cells. Symptoms may include fever, chills, nausea, low blood pressure, and low back or chest pain. Transfusion-associated graft-versus-host disease (TAGVHD). This is rare. It happens when donated cells attack the body's healthy tissues. What happens before the procedure? You will have a blood test to check your blood type. This test is done to know what kind of blood your body will accept and to match it to the donor blood. If you are going to have a planned surgery, you may be able to do an autologous blood donation. This may be done in case you need to have a transfusion. You will have your temperature, blood pressure, and pulse checked before the transfusion. If you have had an allergic reaction to a transfusion in the past, you may be given medicine to help prevent a reaction. This medicine may be given to you by mouth (orally) or through an IV. What happens during the procedure?  An IV will be inserted into one of your veins. The bag of blood will be attached to your IV. The blood will then enter through your vein. Your temperature, blood pressure,  and pulse will be monitored during the transfusion. This monitoring is done to detect early signs of a transfusion reaction. Tell your nurse right away if you have any of these symptoms during the transfusion: Shortness of breath or trouble breathing. Chest or back pain. Fever or  chills. Itching or hives. If you have any signs or symptoms of a reaction, your transfusion will be stopped and you may be given medicine. When the transfusion is complete, your IV will be removed. Pressure may be applied to the IV site for a few minutes. A bandage (dressing)will be applied. The procedure may vary among health care providers and hospitals. What happens after the procedure? Your temperature, blood pressure, pulse, breathing rate, and blood oxygen level will be monitored until you leave the hospital or clinic. Your blood may be tested to see how you have responded to the transfusion. You may be warmed with fluids or blankets to maintain a normal body temperature. If you receive your blood transfusion in an outpatient setting, you will be told whom to contact to report any reactions. Where to find more information Visit the [employer]: redcross.org Summary A blood transfusion is a procedure in which you receive blood or a type of blood cell (blood component) through an IV. The blood given in a transfusion may be made up of different blood components. You may receive red blood cells, platelets, plasma, or white blood cells depending on the condition treated. Your temperature, blood pressure, and pulse will be monitored before, during, and after the transfusion. After the transfusion, your blood may be tested to see how your body has responded. This information is not intended to replace advice given to you by your health care provider. Make sure you discuss any questions you have with your health care provider. Document Revised: 10/13/2021 Document Reviewed: 10/13/2021 Elsevier Patient Education  2024 [employer].

## 2023-12-16 LAB — TYPE AND SCREEN
ABO/RH(D): A NEG
Antibody Screen: NEGATIVE
Unit division: 0

## 2023-12-16 LAB — BPAM RBC
Blood Product Expiration Date: 202506032359
ISSUE DATE / TIME: 202505161003
Unit Type and Rh: 600

## 2023-12-17 ENCOUNTER — Inpatient Hospital Stay: Payer: Medicare Other

## 2023-12-18 ENCOUNTER — Inpatient Hospital Stay

## 2023-12-20 NOTE — Research (Unsigned)
 A PHASE III TRIAL OF STEREOTACTIC RADIOSURGERY COMPARED WITH HIPPOCAMPAL-AVOIDANT WHOLE BRAIN RADIOTHERAPY (HA-WBRT) PLUS MEMANTINE  FOR 5 OR MORE BRAIN METASTASES  Met patient in waiting room. Baseline AEs remain unchanged.  Victory Gravel Jakai Onofre, RN, BSN, [facility]  [facility] Clinical Research Nurse [facility] Direct Dial [phone] 12/20/2023 3:53 PM

## 2023-12-23 ENCOUNTER — Emergency Department (HOSPITAL_COMMUNITY)
Admission: EM | Admit: 2023-12-23 | Discharge: 2023-12-23 | Disposition: A | Discharge status: Home / Self Care | Attending: Emergency Medicine | Admitting: Emergency Medicine

## 2023-12-23 ENCOUNTER — Encounter (HOSPITAL_COMMUNITY): Payer: Self-pay | Admitting: *Deleted

## 2023-12-23 ENCOUNTER — Other Ambulatory Visit: Payer: Self-pay

## 2023-12-23 DIAGNOSIS — Z794 Long term (current) use of insulin: Secondary | ICD-10-CM | POA: Diagnosis not present

## 2023-12-23 DIAGNOSIS — R5383 Other fatigue: Secondary | ICD-10-CM | POA: Diagnosis present

## 2023-12-23 DIAGNOSIS — R7309 Other abnormal glucose: Secondary | ICD-10-CM | POA: Insufficient documentation

## 2023-12-23 DIAGNOSIS — Z85118 Personal history of other malignant neoplasm of bronchus and lung: Secondary | ICD-10-CM | POA: Insufficient documentation

## 2023-12-23 LAB — BASIC METABOLIC PANEL WITH GFR
Anion gap: 5 (ref 5–15)
BUN: 7 mg/dL — ABNORMAL LOW (ref 8–23)
CO2: 25 mmol/L (ref 22–32)
Calcium: 8.6 mg/dL — ABNORMAL LOW (ref 8.9–10.3)
Chloride: 106 mmol/L (ref 98–111)
Creatinine, Ser: 1.2 mg/dL — ABNORMAL HIGH (ref 0.44–1.00)
GFR, Estimated: 49 mL/min — ABNORMAL LOW (ref >60)
Glucose, Bld: 135 mg/dL — ABNORMAL HIGH (ref 70–99)
Potassium: 3.6 mmol/L (ref 3.5–5.1)
Sodium: 136 mmol/L (ref 135–145)

## 2023-12-23 LAB — CBC WITH DIFFERENTIAL/PLATELET
Abs Immature Granulocytes: 0.03 10*3/uL (ref 0.00–0.07)
Basophils Absolute: 0 10*3/uL (ref 0.0–0.1)
Basophils Relative: 1 %
Eosinophils Absolute: 0 10*3/uL (ref 0.0–0.5)
Eosinophils Relative: 1 %
HCT: 24.8 % — ABNORMAL LOW (ref 36.0–46.0)
Hemoglobin: 8.7 g/dL — ABNORMAL LOW (ref 12.0–15.0)
Immature Granulocytes: 1 %
Lymphocytes Relative: 24 %
Lymphs Abs: 1.1 10*3/uL (ref 0.7–4.0)
MCH: 31.2 pg (ref 26.0–34.0)
MCHC: 35.1 g/dL (ref 30.0–36.0)
MCV: 88.9 fL (ref 80.0–100.0)
Monocytes Absolute: 0.7 10*3/uL (ref 0.1–1.0)
Monocytes Relative: 16 %
Neutro Abs: 2.6 10*3/uL (ref 1.7–7.7)
Neutrophils Relative %: 57 %
Platelets: 164 10*3/uL (ref 150–400)
RBC: 2.79 MIL/uL — ABNORMAL LOW (ref 3.87–5.11)
RDW: 18.7 % — ABNORMAL HIGH (ref 11.5–15.5)
WBC: 4.5 10*3/uL (ref 4.0–10.5)
nRBC: 0 % (ref 0.0–0.2)

## 2023-12-23 LAB — TYPE AND SCREEN
ABO/RH(D): A NEG
Antibody Screen: NEGATIVE

## 2023-12-23 MED ORDER — HEPARIN SOD (PORK) LOCK FLUSH 100 UNIT/ML IV SOLN
500.0000 [IU] | Freq: Once | INTRAVENOUS | Status: AC
  Administered 2023-12-23: 500 [IU]
  Filled 2023-12-23: qty 5

## 2023-12-23 NOTE — ED Notes (Addendum)
 Pt refusing to change into gown or be placed on the cardiac monitor. Writer explained to pt that because of her complaint, a gown and monitoring is necessary. Pt stated "I am not staying ya'll have tricked me with that before I want my blood drawn and I will go home, they (the cancer center) told me to get it drawn and I can get a transfusion tomorrow there" primary RN and MD notified.

## 2023-12-23 NOTE — ED Provider Notes (Signed)
 [place] EMERGENCY DEPARTMENT AT [facility] Provider Note   CSN: [id number] Arrival date & time: 12/23/23  1715     History {Add pertinent medical, surgical, social history, OB history to HPI:1} Chief Complaint  Patient presents with   Fatigue    [name] is a 68 y.o. female.  Patient complains of weakness.  She has a history of lung cancer and thinks her hemoglobin is low   Weakness      Home Medications Prior to Admission medications   Medication Sig Start Date End Date Taking? Authorizing Provider  ACCU-CHEK AVIVA PLUS test strip USE TO MONITOR GLUCOSE LEVELS TWICE A DAY E11.9 11/27/21   Gwyndolyn Lerner, MD  ACCU-CHEK SOFTCLIX LANCETS lancets Use to monitor glucose levels BID; E11.9 Patient taking differently: 1 each by Other route 2 (two) times daily. E11.9 08/21/18   Gwyndolyn Lerner, MD  acetaminophen  (TYLENOL ) 500 MG tablet Take 1 tablet (500 mg total) by mouth every 6 (six) hours as needed. Patient taking differently: Take 500 mg by mouth every 6 (six) hours as needed (for pain). 08/07/17   Horton, Vonzella Guernsey, MD  atorvastatin  (LIPITOR) 20 MG tablet Take 20 mg by mouth daily. 04/27/17   [provider]  Blood Glucose Calibration (GLUCOSE CONTROL) SOLN 1 Bottle by In Vitro route as needed. Use to calibrate Accu-Chek Aviva Plus device prn; please provide control solution compatible with Accu-Chek Aviva Plus device. Patient taking differently: 1 each by Other route as needed (Use to calibrate glucometer). E11.9 08/21/18   Gwyndolyn Lerner, MD  Blood Glucose Monitoring Suppl (ACCU-CHEK AVIVA PLUS) w/Device KIT 1 each by Does not apply route 2 (two) times daily. Use to monitor glucose levels BID; E11.9 Patient taking differently: 1 each by Does not apply route 2 (two) times daily. E11.9 08/19/18   Gwyndolyn Lerner, MD  clotrimazole  (MYCELEX ) 10 MG troche Take 10 mg by mouth 5 (five) times daily. 11/06/23   [provider]  escitalopram  (LEXAPRO ) 10 MG  tablet Take 1 tablet (10 mg total) by mouth every morning. 10/25/23   Alysia Bachelor, MD  ferrous sulfate  325 (65 FE) MG tablet Take 1 tablet (325 mg total) by mouth 2 (two) times daily with a meal. 11/15/23 12/15/23  Shahmehdi, Constantino Demark, MD  folic acid  (FOLVITE ) 1 MG tablet Take 1 tablet (1 mg total) by mouth daily. Start 7 days before pemetrexed  chemotherapy. Continue until 21 days after pemetrexed  completed. 08/06/23   Marlene Simas, MD  insulin  glargine (LANTUS  SOLOSTAR) 100 UNIT/ML Solostar Pen Inject 15 Units into the skin at bedtime. Patient taking differently: Inject 15 Units into the skin at bedtime as needed (high bs). 06/25/23   Wendel Hals, NP  Insulin  Pen Needle (B-D ULTRAFINE III SHORT PEN) 31G X 8 MM MISC USE AS DIRECTED 01/17/23   Wendel Hals, NP  lidocaine  (XYLOCAINE ) 2 % solution every 6 (six) hours as needed for mouth pain. Apply topically to mouth for sores 11/06/23   [provider]  lidocaine -prilocaine  (EMLA ) cream Apply to affected area once Patient taking differently: Apply 1 Application topically once. Apply to affected area once 08/06/23   Marlene Simas, MD  magic mouthwash SOLN Take 5 mLs by mouth 3 (three) times daily as needed for mouth pain. Suspension contains equal amounts of Maalox Extra Strength, nystatin, and diphenhydramine .    [provider]  memantine  (NAMENDA ) 10 MG tablet Take 1 tablet (10 mg total) by mouth 2 (two) times daily. Take through 01-20-24,  then stop. 10/18/23   Colie Dawes, MD  ondansetron  (ZOFRAN ) 8 MG tablet Take 1 tablet (8 mg total) by mouth every 8 (eight) hours as needed for nausea or vomiting. Start on the third day after carboplatin . 08/06/23   Marlene Simas, MD  pantoprazole  (PROTONIX ) 40 MG tablet TAKE 1 TABLET BY MOUTH TWICE A DAY 30 MINS BEFORE YOUR FIRST AND LAST MEAL. 11/20/23   Lanney Pitts, PA-C  prochlorperazine  (COMPAZINE ) 10 MG tablet Take 1 tablet (10 mg total) by mouth every 6 (six) hours as needed  for nausea or vomiting. 08/06/23   Marlene Simas, MD  traZODone  (DESYREL ) 100 MG tablet Take 2 tablets (200 mg total) by mouth at bedtime. 10/25/23   Alysia Bachelor, MD      Allergies    Synthroid [levothyroxine sodium], Voltaren  [diclofenac  sodium], Hydrochlorothiazide, and Iodine    Review of Systems   Review of Systems  Neurological:  Positive for weakness.    Physical Exam Updated Vital Signs BP (!) 149/76   Pulse (!) 51   Temp 97.8 F (36.6 C) (Oral)   Resp 16   Ht 5\' 7"  (1.702 m)   Wt 75.4 kg   SpO2 98%   BMI 26.03 kg/m  Physical Exam  ED Results / Procedures / Treatments   Labs (all labs ordered are listed, but only abnormal results are displayed) Labs Reviewed  CBC WITH DIFFERENTIAL/PLATELET - Abnormal; Notable for the following components:      Result Value   RBC 2.79 (*)    Hemoglobin 8.7 (*)    HCT 24.8 (*)    RDW 18.7 (*)    All other components within normal limits  BASIC METABOLIC PANEL WITH GFR - Abnormal; Notable for the following components:   Glucose, Bld 135 (*)    BUN 7 (*)    Creatinine, Ser 1.20 (*)    Calcium  8.6 (*)    GFR, Estimated 49 (*)    All other components within normal limits  TYPE AND SCREEN    EKG None  Radiology No results found.  Procedures Procedures  {Document cardiac monitor, telemetry assessment procedure when appropriate:1}  Medications Ordered in ED Medications - No data to display  ED Course/ Medical Decision Making/ A&P   {   Click here for ABCD2, HEART and other calculatorsREFRESH Note before signing :1}                              Medical Decision Making Amount and/or Complexity of Data Reviewed Labs: ordered.   Patient complains of weakness.  She has a history of lung cancer.  She states a lot of times she needs to get blood.  Her hemoglobin was 8.7 and she was pleased with that.  rest of her labs shows mild decreased calcium  and mild increase glucose.  Patient will be discharged home to follow-up  with her doctor  {Document critical care time when appropriate:1} {Document review of labs and clinical decision tools ie heart score, Chads2Vasc2 etc:1}  {Document your independent review of radiology images, and any outside records:1} {Document your discussion with family members, caretakers, and with consultants:1} {Document social determinants of health affecting pt's care:1} {Document your decision making why or why not admission, treatments were needed:1} Final Clinical Impression(s) / ED Diagnoses Final diagnoses:  Other fatigue    Rx / DC Orders ED Discharge Orders     None

## 2023-12-23 NOTE — Discharge Instructions (Signed)
 Follow up with your md this week.  Return sooner if worsening

## 2023-12-23 NOTE — ED Triage Notes (Addendum)
 Pt feeling fatigued and wants her hemoglobin checked. Denies pain. Last chemo was 2 weeks ago for lung CA.

## 2023-12-25 ENCOUNTER — Telehealth: Payer: Self-pay

## 2023-12-25 ENCOUNTER — Inpatient Hospital Stay

## 2023-12-25 ENCOUNTER — Other Ambulatory Visit: Payer: Medicare Other

## 2023-12-25 ENCOUNTER — Ambulatory Visit: Payer: Medicare Other

## 2023-12-25 ENCOUNTER — Ambulatory Visit: Payer: Medicare Other | Admitting: Internal Medicine

## 2023-12-25 VITALS — BP 131/96 | HR 51 | Temp 98.1°F | Resp 16

## 2023-12-25 DIAGNOSIS — Z95828 Presence of other vascular implants and grafts: Secondary | ICD-10-CM

## 2023-12-25 DIAGNOSIS — Z5111 Encounter for antineoplastic chemotherapy: Secondary | ICD-10-CM | POA: Diagnosis not present

## 2023-12-25 DIAGNOSIS — C3491 Malignant neoplasm of unspecified part of right bronchus or lung: Secondary | ICD-10-CM

## 2023-12-25 LAB — CMP (CANCER CENTER ONLY)
ALT: 7 U/L (ref 0–44)
AST: 19 U/L (ref 15–41)
Albumin: 3.3 g/dL — ABNORMAL LOW (ref 3.5–5.0)
Alkaline Phosphatase: 66 U/L (ref 38–126)
Anion gap: 7 (ref 5–15)
BUN: 8 mg/dL (ref 8–23)
CO2: 27 mmol/L (ref 22–32)
Calcium: 8.7 mg/dL — ABNORMAL LOW (ref 8.9–10.3)
Chloride: 103 mmol/L (ref 98–111)
Creatinine: 1.25 mg/dL — ABNORMAL HIGH (ref 0.44–1.00)
GFR, Estimated: 47 mL/min — ABNORMAL LOW (ref >60)
Glucose, Bld: 166 mg/dL — ABNORMAL HIGH (ref 70–99)
Potassium: 3.9 mmol/L (ref 3.5–5.1)
Sodium: 137 mmol/L (ref 135–145)
Total Bilirubin: 0.4 mg/dL (ref 0.0–1.2)
Total Protein: 6 g/dL — ABNORMAL LOW (ref 6.5–8.1)

## 2023-12-25 LAB — CBC WITH DIFFERENTIAL (CANCER CENTER ONLY)
Abs Immature Granulocytes: 0.03 10*3/uL (ref 0.00–0.07)
Basophils Absolute: 0 10*3/uL (ref 0.0–0.1)
Basophils Relative: 0 %
Eosinophils Absolute: 0 10*3/uL (ref 0.0–0.5)
Eosinophils Relative: 1 %
HCT: 23.8 % — ABNORMAL LOW (ref 36.0–46.0)
Hemoglobin: 8.4 g/dL — ABNORMAL LOW (ref 12.0–15.0)
Immature Granulocytes: 1 %
Lymphocytes Relative: 21 %
Lymphs Abs: 1.1 10*3/uL (ref 0.7–4.0)
MCH: 30.4 pg (ref 26.0–34.0)
MCHC: 35.3 g/dL (ref 30.0–36.0)
MCV: 86.2 fL (ref 80.0–100.0)
Monocytes Absolute: 0.7 10*3/uL (ref 0.1–1.0)
Monocytes Relative: 15 %
Neutro Abs: 3.1 10*3/uL (ref 1.7–7.7)
Neutrophils Relative %: 62 %
Platelet Count: 167 10*3/uL (ref 150–400)
RBC: 2.76 MIL/uL — ABNORMAL LOW (ref 3.87–5.11)
RDW: 18.6 % — ABNORMAL HIGH (ref 11.5–15.5)
WBC Count: 5 10*3/uL (ref 4.0–10.5)
nRBC: 0 % (ref 0.0–0.2)

## 2023-12-25 LAB — SAMPLE TO BLOOD BANK

## 2023-12-25 MED ORDER — SODIUM CHLORIDE 0.9% FLUSH
10.0000 mL | Freq: Once | INTRAVENOUS | Status: AC
  Administered 2023-12-25: 10 mL

## 2023-12-25 MED ORDER — HEPARIN SOD (PORK) LOCK FLUSH 100 UNIT/ML IV SOLN
500.0000 [IU] | Freq: Once | INTRAVENOUS | Status: AC
  Administered 2023-12-25: 500 [IU]

## 2023-12-25 NOTE — Telephone Encounter (Signed)
 [facility] DOB: March 01, 1956.  She's a lab only today.  Her HGB = 8.4.  Cassandra L Heilingoetter, PA-C No blood needed. Thank you!  Pt advised and voiced understanding

## 2023-12-26 ENCOUNTER — Encounter: Payer: Self-pay | Admitting: Internal Medicine

## 2023-12-26 ENCOUNTER — Ambulatory Visit (HOSPITAL_COMMUNITY)

## 2023-12-26 ENCOUNTER — Telehealth: Payer: Self-pay

## 2023-12-26 NOTE — Telephone Encounter (Signed)
 Spoke with research team this morning regarding the cancellation of the patient's scan scheduled for today. Informed research that the scan had not yet been authorized, which led to the cancellation.   Contacted the PA team, and the scan is now authorized. Patient has been informed of the updated scan date and time.

## 2023-12-26 NOTE — Telephone Encounter (Signed)
 A PHASE III TRIAL OF STEREOTACTIC RADIOSURGERY COMPARED WITH HIPPOCAMPAL-AVOIDANT WHOLE BRAIN RADIOTHERAPY (HA-WBRT) PLUS MEMANTINE  FOR 5 OR MORE BRAIN METASTASES  Called patient to confirm highest level of education (college grad). While talking she asked about the imaging that was cancelled today d/t to PA. This RN will send a message to Dr Bing Buff nurse and request they clarify plan with pt. Patient verbalized understanding.  Victory Gravel Dede Dobesh, RN, BSN, [facility] Clinical Research Nurse [facility] Direct Dial [phone] 12/26/2023 10:27 AM

## 2023-12-30 ENCOUNTER — Ambulatory Visit (HOSPITAL_COMMUNITY)

## 2023-12-30 NOTE — Addendum Note (Signed)
 Encounter addended by: Colie Dawes, MD on: 12/30/2023 7:26 AM  Actions taken: Clinical Note Signed

## 2024-01-01 ENCOUNTER — Inpatient Hospital Stay

## 2024-01-01 ENCOUNTER — Encounter: Admitting: Dietician

## 2024-01-01 ENCOUNTER — Other Ambulatory Visit

## 2024-01-01 ENCOUNTER — Encounter: Payer: Self-pay | Admitting: Medical Oncology

## 2024-01-01 ENCOUNTER — Inpatient Hospital Stay: Admitting: Dietician

## 2024-01-01 ENCOUNTER — Encounter: Payer: Self-pay | Admitting: Internal Medicine

## 2024-01-01 ENCOUNTER — Inpatient Hospital Stay (HOSPITAL_BASED_OUTPATIENT_CLINIC_OR_DEPARTMENT_OTHER): Admitting: Internal Medicine

## 2024-01-01 ENCOUNTER — Ambulatory Visit

## 2024-01-01 ENCOUNTER — Inpatient Hospital Stay: Attending: Internal Medicine

## 2024-01-01 ENCOUNTER — Ambulatory Visit: Admitting: Internal Medicine

## 2024-01-01 VITALS — BP 167/69 | HR 64 | Temp 97.4°F | Resp 18 | Ht 67.0 in | Wt 163.7 lb

## 2024-01-01 DIAGNOSIS — E119 Type 2 diabetes mellitus without complications: Secondary | ICD-10-CM | POA: Insufficient documentation

## 2024-01-01 DIAGNOSIS — D649 Anemia, unspecified: Secondary | ICD-10-CM | POA: Diagnosis not present

## 2024-01-01 DIAGNOSIS — E785 Hyperlipidemia, unspecified: Secondary | ICD-10-CM | POA: Diagnosis not present

## 2024-01-01 DIAGNOSIS — Z860100 Personal history of colon polyps, unspecified: Secondary | ICD-10-CM | POA: Insufficient documentation

## 2024-01-01 DIAGNOSIS — C7931 Secondary malignant neoplasm of brain: Secondary | ICD-10-CM

## 2024-01-01 DIAGNOSIS — R112 Nausea with vomiting, unspecified: Secondary | ICD-10-CM | POA: Insufficient documentation

## 2024-01-01 DIAGNOSIS — N210 Calculus in bladder: Secondary | ICD-10-CM | POA: Diagnosis not present

## 2024-01-01 DIAGNOSIS — Z923 Personal history of irradiation: Secondary | ICD-10-CM | POA: Insufficient documentation

## 2024-01-01 DIAGNOSIS — C7951 Secondary malignant neoplasm of bone: Secondary | ICD-10-CM | POA: Diagnosis not present

## 2024-01-01 DIAGNOSIS — Z7984 Long term (current) use of oral hypoglycemic drugs: Secondary | ICD-10-CM | POA: Diagnosis not present

## 2024-01-01 DIAGNOSIS — I700 Atherosclerosis of aorta: Secondary | ICD-10-CM | POA: Insufficient documentation

## 2024-01-01 DIAGNOSIS — I3139 Other pericardial effusion (noninflammatory): Secondary | ICD-10-CM | POA: Diagnosis not present

## 2024-01-01 DIAGNOSIS — K219 Gastro-esophageal reflux disease without esophagitis: Secondary | ICD-10-CM | POA: Diagnosis not present

## 2024-01-01 DIAGNOSIS — Z86711 Personal history of pulmonary embolism: Secondary | ICD-10-CM | POA: Diagnosis not present

## 2024-01-01 DIAGNOSIS — Z95828 Presence of other vascular implants and grafts: Secondary | ICD-10-CM

## 2024-01-01 DIAGNOSIS — Z79899 Other long term (current) drug therapy: Secondary | ICD-10-CM | POA: Diagnosis not present

## 2024-01-01 DIAGNOSIS — R5383 Other fatigue: Secondary | ICD-10-CM | POA: Diagnosis not present

## 2024-01-01 DIAGNOSIS — G4700 Insomnia, unspecified: Secondary | ICD-10-CM | POA: Insufficient documentation

## 2024-01-01 DIAGNOSIS — Z9641 Presence of insulin pump (external) (internal): Secondary | ICD-10-CM | POA: Insufficient documentation

## 2024-01-01 DIAGNOSIS — I10 Essential (primary) hypertension: Secondary | ICD-10-CM | POA: Insufficient documentation

## 2024-01-01 DIAGNOSIS — K5730 Diverticulosis of large intestine without perforation or abscess without bleeding: Secondary | ICD-10-CM | POA: Insufficient documentation

## 2024-01-01 DIAGNOSIS — Z794 Long term (current) use of insulin: Secondary | ICD-10-CM | POA: Insufficient documentation

## 2024-01-01 DIAGNOSIS — C3491 Malignant neoplasm of unspecified part of right bronchus or lung: Secondary | ICD-10-CM

## 2024-01-01 DIAGNOSIS — Z5112 Encounter for antineoplastic immunotherapy: Secondary | ICD-10-CM | POA: Insufficient documentation

## 2024-01-01 DIAGNOSIS — R609 Edema, unspecified: Secondary | ICD-10-CM | POA: Diagnosis not present

## 2024-01-01 LAB — CBC WITH DIFFERENTIAL (CANCER CENTER ONLY)
Abs Immature Granulocytes: 0.04 10*3/uL (ref 0.00–0.07)
Basophils Absolute: 0 10*3/uL (ref 0.0–0.1)
Basophils Relative: 1 %
Eosinophils Absolute: 0.1 10*3/uL (ref 0.0–0.5)
Eosinophils Relative: 2 %
HCT: 24.5 % — ABNORMAL LOW (ref 36.0–46.0)
Hemoglobin: 8.6 g/dL — ABNORMAL LOW (ref 12.0–15.0)
Immature Granulocytes: 1 %
Lymphocytes Relative: 17 %
Lymphs Abs: 0.9 10*3/uL (ref 0.7–4.0)
MCH: 30.8 pg (ref 26.0–34.0)
MCHC: 35.1 g/dL (ref 30.0–36.0)
MCV: 87.8 fL (ref 80.0–100.0)
Monocytes Absolute: 0.6 10*3/uL (ref 0.1–1.0)
Monocytes Relative: 10 %
Neutro Abs: 3.8 10*3/uL (ref 1.7–7.7)
Neutrophils Relative %: 69 %
Platelet Count: 145 10*3/uL — ABNORMAL LOW (ref 150–400)
RBC: 2.79 MIL/uL — ABNORMAL LOW (ref 3.87–5.11)
RDW: 18.2 % — ABNORMAL HIGH (ref 11.5–15.5)
WBC Count: 5.5 10*3/uL (ref 4.0–10.5)
nRBC: 0 % (ref 0.0–0.2)

## 2024-01-01 LAB — CMP (CANCER CENTER ONLY)
ALT: 7 U/L (ref 0–44)
AST: 19 U/L (ref 15–41)
Albumin: 3.4 g/dL — ABNORMAL LOW (ref 3.5–5.0)
Alkaline Phosphatase: 65 U/L (ref 38–126)
Anion gap: 7 (ref 5–15)
BUN: 10 mg/dL (ref 8–23)
CO2: 27 mmol/L (ref 22–32)
Calcium: 8.9 mg/dL (ref 8.9–10.3)
Chloride: 104 mmol/L (ref 98–111)
Creatinine: 1.41 mg/dL — ABNORMAL HIGH (ref 0.44–1.00)
GFR, Estimated: 40 mL/min — ABNORMAL LOW (ref >60)
Glucose, Bld: 142 mg/dL — ABNORMAL HIGH (ref 70–99)
Potassium: 4 mmol/L (ref 3.5–5.1)
Sodium: 138 mmol/L (ref 135–145)
Total Bilirubin: 0.5 mg/dL (ref 0.0–1.2)
Total Protein: 6.3 g/dL — ABNORMAL LOW (ref 6.5–8.1)

## 2024-01-01 LAB — SAMPLE TO BLOOD BANK

## 2024-01-01 MED ORDER — SODIUM CHLORIDE 0.9 % IV SOLN
200.0000 mg | Freq: Once | INTRAVENOUS | Status: AC
  Administered 2024-01-01: 200 mg via INTRAVENOUS
  Filled 2024-01-01: qty 200

## 2024-01-01 MED ORDER — SODIUM CHLORIDE 0.9 % IV SOLN: INTRAVENOUS | Status: DC

## 2024-01-01 MED ORDER — PROCHLORPERAZINE MALEATE 10 MG PO TABS
10.0000 mg | ORAL_TABLET | Freq: Once | ORAL | Status: AC
  Administered 2024-01-01: 10 mg via ORAL
  Filled 2024-01-01: qty 1

## 2024-01-01 MED ORDER — SODIUM CHLORIDE 0.9% FLUSH
10.0000 mL | Freq: Once | INTRAVENOUS | Status: AC
  Administered 2024-01-01: 10 mL

## 2024-01-01 NOTE — Patient Instructions (Signed)

## 2024-01-01 NOTE — Progress Notes (Signed)
 [facility] [phone]   [phone]  OFFICE PROGRESS NOTE  Molly Pinal, MD [address] [place] [address]  DIAGNOSIS:  1) recurrent/metastatic non-small cell lung cancer, adenocarcinoma presented with metastatic adenopathy in the thorax as well as mediastinum, axillary lymph nodes and potential local recurrence at the right hilum along the inferior margin of the resection staple line with bilateral hypermetabolic adrenal metastasis and hypermetabolic bone metastasis to the T11 vertebral body as well as brain metastasis diagnosed in December 2024. 2) incidental bilaterally pulmonary embolism diagnosed on CT scan of the chest on October 22, 2023 2) Stage IA (T1a, N0, M0) non-small cell lung cancer, adenocarcinoma presented with right upper lobe pulmonary nodule   Detected Alteration(s) / Biomarker(s) Associated FDA-approved therapies Clinical Trial Availability % cfDNA or Amplification KRAS G12C approved by FDA Adagrasib, Sotorasib approved in other indication Adagrasib+cetuximab Yes 2.8%  TP53 E204* None Yes 2.0%   PRIOR THERAPY:  1) S/P right VATS with right upper lobectomy and mediastinal lymph node dissection under the care of Dr. Luna Salinas on October 15, 2016. 2) whole brain irradiation under the care of of Dr. [name].  CURRENT THERAPY:  1) Systemic chemotherapy with carboplatin  for AUC of 5, Alimta 500 Mg/M2 and Keytruda  200 Mg IV every 3 weeks.  First dose August 20, 2022.  Status post 4  cycles. 2) Eliquis  starter kit with 10 mg p.o. twice daily for 7 days followed by 5 mg p.o. daily.  First dose started October 18, 2023.   INTERVAL HISTORY: [name] 68 y.o. female returns to the clinic today for annual follow-up visit.Discussed the use of AI scribe software for clinical note transcription with the patient, who gave verbal consent to proceed.  History of Present Illness   [name] is a 68 year old  female with brain metastasis and anemia who presents for evaluation before starting cycle number seven of Keytruda  for recurrent/metastatic non-small cell lung cancer, adenocarcinoma.  She recently visited the emergency room due to a sensation of being 'low on blood,' which she associated with her history of anemia following treatment. Her hemoglobin was found to be 8.7. Her current hemoglobin level is 8.6, which is an improvement from 5.0 four weeks ago. She notes that Keytruda  has not caused a significant drop in her blood count compared to previous treatments.  An MRI of the brain revealed several spots, and she has already received radiation treatment. She has experienced a new symptom of vertigo, which she attributes to the brain metastasis.  She mentions a recent issue with a CT scan appointment being cancelled due to a mix-up with another patient who had a similar name, but it has since been rescheduled.       MEDICAL HISTORY: Past Medical History:  Diagnosis Date   Adenocarcinoma of right lung, stage 1 (HCC) 11/29/2016   Cancer (HCC)    Chronic pain    Depression    takes Lexapro  daily   Diabetes mellitus    takes Metformin  daily and has an insulin  pump.Fasting blood sugar runs250   GERD (gastroesophageal reflux disease)    takes Pantoprazole  daily   History of colon polyps    benign   Hyperlipidemia    takes Atorvastatin  daily   Hypertension    takes Amlodipine  and Lisinopril  daily   Insomnia    takes Trazodone  nightly   Nocturia    Thyroid  disease    Urinary frequency  Urinary urgency     ALLERGIES:  is allergic to synthroid [levothyroxine sodium], voltaren  [diclofenac  sodium], hydrochlorothiazide, and iodine.  MEDICATIONS:  Current Outpatient Medications  Medication Sig Dispense Refill   ACCU-CHEK AVIVA PLUS test strip USE TO MONITOR GLUCOSE LEVELS TWICE A DAY E11.9 100 strip 12   ACCU-CHEK SOFTCLIX LANCETS lancets Use to monitor glucose levels BID; E11.9 (Patient  taking differently: 1 each by Other route 2 (two) times daily. E11.9) 100 each 12   acetaminophen  (TYLENOL ) 500 MG tablet Take 1 tablet (500 mg total) by mouth every 6 (six) hours as needed. (Patient taking differently: Take 500 mg by mouth every 6 (six) hours as needed (for pain).) 30 tablet 0   atorvastatin  (LIPITOR) 20 MG tablet Take 20 mg by mouth daily.  11   Blood Glucose Calibration (GLUCOSE CONTROL) SOLN 1 Bottle by In Vitro route as needed. Use to calibrate Accu-Chek Aviva Plus device prn; please provide control solution compatible with Accu-Chek Aviva Plus device. (Patient taking differently: 1 each by Other route as needed (Use to calibrate glucometer). E11.9) 1 each 1   Blood Glucose Monitoring Suppl (ACCU-CHEK AVIVA PLUS) w/Device KIT 1 each by Does not apply route 2 (two) times daily. Use to monitor glucose levels BID; E11.9 (Patient taking differently: 1 each by Does not apply route 2 (two) times daily. E11.9) 1 kit 0   clotrimazole  (MYCELEX ) 10 MG troche Take 10 mg by mouth 5 (five) times daily.     escitalopram  (LEXAPRO ) 10 MG tablet Take 1 tablet (10 mg total) by mouth every morning. 90 tablet 3   ferrous sulfate  325 (65 FE) MG tablet Take 1 tablet (325 mg total) by mouth 2 (two) times daily with a meal. 60 tablet 0   folic acid  (FOLVITE ) 1 MG tablet Take 1 tablet (1 mg total) by mouth daily. Start 7 days before pemetrexed  chemotherapy. Continue until 21 days after pemetrexed  completed. 100 tablet 3   insulin  glargine (LANTUS  SOLOSTAR) 100 UNIT/ML Solostar Pen Inject 15 Units into the skin at bedtime. (Patient taking differently: Inject 15 Units into the skin at bedtime as needed (high bs).) 15 mL 3   Insulin  Pen Needle (B-D ULTRAFINE III SHORT PEN) 31G X 8 MM MISC USE AS DIRECTED 100 each 5   lidocaine  (XYLOCAINE ) 2 % solution every 6 (six) hours as needed for mouth pain. Apply topically to mouth for sores     lidocaine -prilocaine  (EMLA ) cream Apply to affected area once (Patient  taking differently: Apply 1 Application topically once. Apply to affected area once) 30 g 3   magic mouthwash SOLN Take 5 mLs by mouth 3 (three) times daily as needed for mouth pain. Suspension contains equal amounts of Maalox Extra Strength, nystatin, and diphenhydramine .     memantine  (NAMENDA ) 10 MG tablet Take 1 tablet (10 mg total) by mouth 2 (two) times daily. Take through 01-20-24, then stop. 180 tablet 0   ondansetron  (ZOFRAN ) 8 MG tablet Take 1 tablet (8 mg total) by mouth every 8 (eight) hours as needed for nausea or vomiting. Start on the third day after carboplatin . 30 tablet 1   pantoprazole  (PROTONIX ) 40 MG tablet TAKE 1 TABLET BY MOUTH TWICE A DAY 30 MINS BEFORE YOUR FIRST AND LAST MEAL. 180 tablet 1   prochlorperazine  (COMPAZINE ) 10 MG tablet Take 1 tablet (10 mg total) by mouth every 6 (six) hours as needed for nausea or vomiting. 30 tablet 1   traZODone  (DESYREL ) 100 MG tablet Take 2 tablets (200 mg  total) by mouth at bedtime. 180 tablet 3   No current facility-administered medications for this visit.    SURGICAL HISTORY:  Past Surgical History:  Procedure Laterality Date   ABDOMINAL HYSTERECTOMY     ABDOMINAL SURGERY     BIOPSY  05/28/2019   Procedure: BIOPSY;  Surgeon: Alyce Jubilee, MD;  Location: [facility];  Service: Endoscopy;;  random colon/gastric/duodenum   BIOPSY  01/11/2023   Procedure: BIOPSY;  Surgeon: Vinetta Greening, DO;  [facility]: [facility];  Service: Endoscopy;;   COLONOSCOPY  2005 MAC   TICs, IH   COLONOSCOPY N/A 03/08/2014   Procedure: COLONOSCOPY;  Surgeon: Alyce Jubilee, MD;  [facility]: [facility];  Service: Endoscopy;  Laterality: N/A;  1015   COLONOSCOPY WITH PROPOFOL  N/A 05/28/2019   Procedure: COLONOSCOPY WITH PROPOFOL ;  Surgeon: Alyce Jubilee, MD;  [facility]: [facility];  Service: Endoscopy;  Laterality: N/A;  9:15am   CYSTOSTOMY  11/22/2011   Procedure: CYSTOSTOMY SUPRAPUBIC;  Surgeon: Reggie Caper, MD;  Location: [facility];   Service: Urology;  Laterality: N/A;   ESOPHAGOGASTRODUODENOSCOPY N/A 03/08/2014   Procedure: ESOPHAGOGASTRODUODENOSCOPY (EGD);  Surgeon: Alyce Jubilee, MD;  [facility];  Service: Endoscopy;  Laterality: N/A;   ESOPHAGOGASTRODUODENOSCOPY     ESOPHAGOGASTRODUODENOSCOPY (EGD) WITH PROPOFOL  N/A 05/28/2019   Procedure: ESOPHAGOGASTRODUODENOSCOPY (EGD) WITH PROPOFOL ;  Surgeon: Alyce Jubilee, MD;  [facility];  Service: Endoscopy;  Laterality: N/A;   ESOPHAGOGASTRODUODENOSCOPY (EGD) WITH PROPOFOL  N/A 01/11/2023   Procedure: ESOPHAGOGASTRODUODENOSCOPY (EGD) WITH PROPOFOL ;  Surgeon: Vinetta Greening, DO;  [facility];  Service: Endoscopy;  Laterality: N/A;  9:45 am, asa 3   GIVENS CAPSULE STUDY N/A 06/15/2019   Procedure: GIVENS CAPSULE STUDY;  Surgeon: Alyce Jubilee, MD;  [facility]: [facility];  Service: Endoscopy;  Laterality: N/A;  7:30am   HALLUX VALGUS CORRECTION     bilateral foot surgery for bone repairs-multiple   IR IMAGING GUIDED PORT INSERTION  08/19/2023   LOBECTOMY Right 10/15/2016   Procedure: RIGHT UPPER LOBECTOMY;  Surgeon: Zelphia Higashi, MD;  Location: [facility] OR;  Service: Thoracic;  Laterality: Right;   LYMPH NODE DISSECTION Right 10/15/2016   Procedure: LYMPH NODE DISSECTION;  Surgeon: Zelphia Higashi, MD;  Location: [facility];  Service: Thoracic;  Laterality: Right;   POLYPECTOMY  05/28/2019   Procedure: POLYPECTOMY;  Surgeon: Alyce Jubilee, MD;  [facility];  Service: Endoscopy;;   SAVORY DILATION N/A 05/28/2019   Procedure: SAVORY DILATION;  Surgeon: Alyce Jubilee, MD;  [facility];  Service: Endoscopy;  Laterality: N/A;  15/16/17   VAGINA RECONSTRUCTION SURGERY     tvt 2006-     VIDEO ASSISTED THORACOSCOPY (VATS)/WEDGE RESECTION Right 10/15/2016   Procedure: RIGHT VIDEO ASSISTED THORACOSCOPY (VATS)/WEDGE RESECTION;  Surgeon: Zelphia Higashi, MD;  Location: [facility];  Service: Thoracic;   Laterality: Right;    REVIEW OF SYSTEMS:  Constitutional: positive for fatigue Eyes: negative Ears, nose, mouth, throat, and face: negative Respiratory: positive for dyspnea on exertion Cardiovascular: negative Gastrointestinal: negative Genitourinary:negative Integument/breast: negative Hematologic/lymphatic: negative Musculoskeletal:negative Neurological: positive for dizziness Behavioral/Psych: negative Endocrine: negative Allergic/Immunologic: negative   PHYSICAL EXAMINATION: General appearance: alert, cooperative, fatigued, and no distress Head: Normocephalic, without obvious abnormality, atraumatic Neck: no adenopathy, no JVD, supple, symmetrical, trachea midline, and thyroid  not enlarged, symmetric, no tenderness/mass/nodules Lymph nodes: Cervical, supraclavicular, and axillary nodes normal. Resp: clear to auscultation bilaterally Back: symmetric, no curvature. ROM  normal. No CVA tenderness. Cardio: regular rate and rhythm, S1, S2 normal, no murmur, click, rub or gallop GI: soft, non-tender; bowel sounds normal; no masses,  no organomegaly Extremities: extremities normal, atraumatic, no cyanosis or edema Neurologic: Alert and oriented X 3, normal strength and tone. Normal symmetric reflexes. Normal coordination and gait  ECOG PERFORMANCE STATUS: 1 - Symptomatic but completely ambulatory  Blood pressure (!) 167/69, pulse 64, temperature (!) 97.4 F (36.3 C), temperature source Tympanic, resp. rate 18, height 5\' 7"  (1.702 m), weight 163 lb 11.2 oz (74.3 kg), SpO2 100%.  LABORATORY DATA: Lab Results  Component Value Date   WBC 5.0 12/25/2023   HGB 8.4 (L) 12/25/2023   HCT 23.8 (L) 12/25/2023   MCV 86.2 12/25/2023   PLT 167 12/25/2023      Chemistry      Component Value Date/Time   NA 137 12/25/2023 1409   NA 142 10/29/2022 1158   NA 141 05/29/2017 0921   K 3.9 12/25/2023 1409   K 4.8 05/29/2017 0921   CL 103 12/25/2023 1409   CO2 27 12/25/2023 1409   CO2 28  05/29/2017 0921   BUN 8 12/25/2023 1409   BUN 21 10/29/2022 1158   BUN 13.5 05/29/2017 0921   CREATININE 1.25 (H) 12/25/2023 1409   CREATININE 0.8 05/29/2017 0921      Component Value Date/Time   CALCIUM  8.7 (L) 12/25/2023 1409   CALCIUM  9.5 05/29/2017 0921   ALKPHOS 66 12/25/2023 1409   ALKPHOS 104 05/29/2017 0921   AST 19 12/25/2023 1409   AST 17 05/29/2017 0921   ALT 7 12/25/2023 1409   ALT 20 05/29/2017 0921   BILITOT 0.4 12/25/2023 1409   BILITOT 0.40 05/29/2017 0921       RADIOGRAPHIC STUDIES: MR Brain W Wo Contrast Result Date: 12/11/2023 CLINICAL DATA:  Brain neoplasm EXAM: MRI HEAD WITHOUT AND WITH CONTRAST TECHNIQUE: Multiplanar, multiecho pulse sequences of the brain and surrounding structures were obtained without and with intravenous contrast. CONTRAST:  7.58mL GADAVIST  GADOBUTROL  1 MMOL/ML IV SOLN COMPARISON:  10/07/2023 FINDINGS: Brain: Numerous scattered foci of diffusion restriction, as previously seen. No acute infarct. No acute or chronic hemorrhage. There again demonstrated numerous contrast-enhancing lesions throughout the cerebral hemispheres and the cerebellum. There are at least 20 lesions, which are annotated on the re-formatted series 601 (axial postcontrast T1-weighted imaging). The largest lesion remains in the anterior right frontal lobe and now measures 10 mm, previously 13 mm. There are no new lesions or lesions that have increased in size. Mild multifocal edema has improved, most notably in the frontal lobes. Vascular: Normal flow voids. Skull and upper cervical spine: Trace mastoid fluid bilaterally. Paranasal sinuses are clear. Normal orbits. Sinuses/Orbits:No paranasal sinus fluid levels or advanced mucosal thickening. No mastoid or middle ear effusion. Normal orbits. IMPRESSION: 1. Numerous contrast-enhancing lesions throughout the cerebral hemispheres and the cerebellum, consistent with metastatic disease. The largest lesion remains in the anterior right  frontal lobe and now measures 10 mm, previously 13 mm. 2. No new lesions or lesions that have increased in size. No progression of metastatic disease. 3. Mild multifocal edema has improved, most notably in the frontal lobes. Electronically Signed   By: Juanetta Nordmann M.D.   On: 12/11/2023 03:14   DG Chest Portable 1 View Result Date: 12/04/2023 CLINICAL DATA:  Chest pressure EXAM: PORTABLE CHEST 1 VIEW COMPARISON:  August 28, 2023 FINDINGS: The heart size and mediastinal contours are within normal limits. Both lungs are clear. The visualized  skeletal structures are unremarkable. No change right IJ Infuse-A-Port catheter IMPRESSION: No active disease. Electronically Signed   By: Fredrich Jefferson M.D.   On: 12/04/2023 16:10      ASSESSMENT AND PLAN: This is a very pleasant 68 years old white female with stage IV (T1a, N3, M1 C) non-small cell lung cancer initially diagnosed as stage IA non-small cell lung cancer status post right upper lobectomy with lymph node dissection.  This was diagnosed in March 2018 with evidence for disease recurrence and metastasis in December 2024. The patient has been on observation and she is feeling fine today with no concerning complaints. The patient had repeat CT scan of the chest performed recently.  I personally independently reviewed the scan images and discussed the results with the patient today.  Unfortunately her scan showed interval development of several abnormal lymph nodes throughout the chest including right hilar, mediastinal, supraclavicular and bilateral axillary as well as upper abdominal lymphadenopathy.  There was also increasing right adrenal nodule worrisome for adrenal metastasis. She underwent ultrasound-guided core biopsy of right axillary lymph node and the final pathology was consistent with metastatic adenocarcinoma of lung primary.  We will send the tissue block to [facility] for molecular studies and PD-L1 expression.  Molecular studies by  [id number] showed positive KRAS G12C mutation. She underwent whole brain radiation for brain metastasis. The patient is here today to start the first dose of systemic chemotherapy with carboplatin  for AUC of 5, Alimta 500 Mg/M2 and Keytruda  200 Mg IV every 3 weeks.  First dose August 21, 2023.  Status post 6 cycles. She has been tolerating her treatment fairly well except for the recent syncopal episode secondary to anemia. Assessment and Plan    Recurrent/metastatic non-small cell lung cancer, adenocarcinoma presented with metastatic adenopathy in the thorax as well as mediastinum, axillary lymph nodes and potential local recurrence at the right hilum along the inferior margin of the resection staple line with bilateral hypermetabolic adrenal metastasis and hypermetabolic bone metastasis to the T11 vertebral body as well as brain metastasis diagnosed in December 2024.  She is currently undergoing Systemic chemotherapy with carboplatin  for AUC of 5, Alimta 500 Mg/M2 and Keytruda  200 Mg IV every 3 weeks.  First dose August 20, 2022.  Status post 6  cycles.    Brain metastases Recent MRI indicates several enlarging lesions in the brain. She experienced vertigo, likely related to the metastases. Radiation therapy has been administered. Awaiting further evaluation with MRI scheduled for July 16th. - Review MRI results from July 16th - Adjust treatment plan if MRI shows concerning findings - Consider alternative treatment options if necessary  Maintenance therapy with Keytruda  She is on maintenance therapy with Keytruda  every three weeks. Keytruda  is not causing significant hematologic toxicity compared to the previous regimen. CT scan results are pending to evaluate the effectiveness of the current treatment plan. - Continue Keytruda  every three weeks - Evaluate CT scan results before next visit - Discuss treatment plan adjustments based on CT scan findings  Anemia Chronic anemia with recent  hemoglobin level at 8.6, improved from previous level of 5. Current blood counts are well-managed. - Continue current treatment regimen without Alimta   The patient was advised to call immediately if she has any concerning symptoms in the interval. The patient voices understanding of current disease status and treatment options and is in agreement with the current care plan. All questions were answered. The patient knows to call the clinic with any problems, questions or  concerns. We can certainly see the patient much sooner if necessary.  Disclaimer: This note was dictated with voice recognition software. Similar sounding words can inadvertently be transcribed and may not be corrected upon review.

## 2024-01-01 NOTE — Progress Notes (Signed)
 Hold Alimta today per Dr. Marguerita Shih.  Chelcey Caputo, PharmD, MBA

## 2024-01-05 ENCOUNTER — Other Ambulatory Visit: Payer: Self-pay

## 2024-01-06 ENCOUNTER — Ambulatory Visit (HOSPITAL_COMMUNITY)
Admission: RE | Admit: 2024-01-06 | Discharge: 2024-01-06 | Disposition: A | Discharge status: Home / Self Care | Source: Ambulatory Visit | Attending: Physician Assistant | Admitting: Physician Assistant

## 2024-01-06 ENCOUNTER — Encounter (HOSPITAL_COMMUNITY): Payer: Self-pay

## 2024-01-06 DIAGNOSIS — C3491 Malignant neoplasm of unspecified part of right bronchus or lung: Secondary | ICD-10-CM | POA: Insufficient documentation

## 2024-01-08 ENCOUNTER — Other Ambulatory Visit

## 2024-01-12 ENCOUNTER — Other Ambulatory Visit: Payer: Self-pay

## 2024-01-13 ENCOUNTER — Other Ambulatory Visit: Payer: Self-pay | Admitting: Physician Assistant

## 2024-01-13 ENCOUNTER — Other Ambulatory Visit: Payer: Self-pay

## 2024-01-13 ENCOUNTER — Inpatient Hospital Stay (HOSPITAL_BASED_OUTPATIENT_CLINIC_OR_DEPARTMENT_OTHER): Admitting: Internal Medicine

## 2024-01-13 ENCOUNTER — Inpatient Hospital Stay

## 2024-01-13 ENCOUNTER — Telehealth: Payer: Self-pay | Admitting: Pharmacist

## 2024-01-13 ENCOUNTER — Other Ambulatory Visit (HOSPITAL_COMMUNITY): Payer: Self-pay

## 2024-01-13 ENCOUNTER — Encounter: Payer: Self-pay | Admitting: Internal Medicine

## 2024-01-13 VITALS — BP 161/63 | HR 80 | Temp 98.0°F | Resp 18 | Ht 67.0 in | Wt 164.0 lb

## 2024-01-13 DIAGNOSIS — C3491 Malignant neoplasm of unspecified part of right bronchus or lung: Secondary | ICD-10-CM

## 2024-01-13 DIAGNOSIS — Z95828 Presence of other vascular implants and grafts: Secondary | ICD-10-CM

## 2024-01-13 DIAGNOSIS — Z5112 Encounter for antineoplastic immunotherapy: Secondary | ICD-10-CM | POA: Diagnosis not present

## 2024-01-13 LAB — CBC WITH DIFFERENTIAL (CANCER CENTER ONLY)
Abs Immature Granulocytes: 0.03 10*3/uL (ref 0.00–0.07)
Basophils Absolute: 0 10*3/uL (ref 0.0–0.1)
Basophils Relative: 0 %
Eosinophils Absolute: 0.1 10*3/uL (ref 0.0–0.5)
Eosinophils Relative: 3 %
HCT: 23.7 % — ABNORMAL LOW (ref 36.0–46.0)
Hemoglobin: 8.2 g/dL — ABNORMAL LOW (ref 12.0–15.0)
Immature Granulocytes: 1 %
Lymphocytes Relative: 23 %
Lymphs Abs: 1 10*3/uL (ref 0.7–4.0)
MCH: 31.2 pg (ref 26.0–34.0)
MCHC: 34.6 g/dL (ref 30.0–36.0)
MCV: 90.1 fL (ref 80.0–100.0)
Monocytes Absolute: 0.4 10*3/uL (ref 0.1–1.0)
Monocytes Relative: 9 %
Neutro Abs: 2.9 10*3/uL (ref 1.7–7.7)
Neutrophils Relative %: 64 %
Platelet Count: 129 10*3/uL — ABNORMAL LOW (ref 150–400)
RBC: 2.63 MIL/uL — ABNORMAL LOW (ref 3.87–5.11)
RDW: 16.6 % — ABNORMAL HIGH (ref 11.5–15.5)
Smear Review: NORMAL
WBC Count: 4.5 10*3/uL (ref 4.0–10.5)
nRBC: 0 % (ref 0.0–0.2)

## 2024-01-13 LAB — CMP (CANCER CENTER ONLY)
ALT: 9 U/L (ref 0–44)
AST: 20 U/L (ref 15–41)
Albumin: 3.3 g/dL — ABNORMAL LOW (ref 3.5–5.0)
Alkaline Phosphatase: 63 U/L (ref 38–126)
Anion gap: 7 (ref 5–15)
BUN: 12 mg/dL (ref 8–23)
CO2: 26 mmol/L (ref 22–32)
Calcium: 8.6 mg/dL — ABNORMAL LOW (ref 8.9–10.3)
Chloride: 105 mmol/L (ref 98–111)
Creatinine: 1.41 mg/dL — ABNORMAL HIGH (ref 0.44–1.00)
GFR, Estimated: 40 mL/min — ABNORMAL LOW (ref >60)
Glucose, Bld: 136 mg/dL — ABNORMAL HIGH (ref 70–99)
Potassium: 3.9 mmol/L (ref 3.5–5.1)
Sodium: 138 mmol/L (ref 135–145)
Total Bilirubin: 0.4 mg/dL (ref 0.0–1.2)
Total Protein: 6.1 g/dL — ABNORMAL LOW (ref 6.5–8.1)

## 2024-01-13 LAB — SAMPLE TO BLOOD BANK

## 2024-01-13 MED ORDER — ADAGRASIB 200 MG PO TABS
600.0000 mg | ORAL_TABLET | Freq: Two times a day (BID) | ORAL | 3 refills | Status: DC
Start: 2024-01-13 — End: 2024-05-20
  Filled 2024-01-15: qty 180, 30d supply, fill #0
  Filled 2024-02-05: qty 180, 30d supply, fill #1
  Filled 2024-03-06: qty 180, 30d supply, fill #2

## 2024-01-13 NOTE — Telephone Encounter (Signed)
 Oral Oncology Pharmacist Encounter   Received notification that prior authorization for Krazati is required.   PA submitted on CoverMyMeds Key: [id number] Status is pending   Oral Oncology Clinic will continue to follow.   Jude Norton, PharmD, BCPS, BCOP Hematology/Oncology Clinical Pharmacist Maryan Smalling and [facility] Oral Chemotherapy Navigation Clinics [phone] 01/13/2024 2:31 PM

## 2024-01-13 NOTE — Telephone Encounter (Signed)
 Oral Chemotherapy Pharmacist Encounter   I met with patient and patient's caregivers for overview of new oral chemotherapy medication: Krazati (adagrasib) for the treatment of metastatic non-small cell lung cancer, KRAS G12C-mutated, planned duration until disease progression or unacceptable drug toxicity.   Counseled patient on administration, dosing, side effects, monitoring, drug-food interactions, safe handling, storage, and disposal.  CBC w/ Diff and CMP from 01/13/24 assessed, noted patient with Hgb 8.2 g/dL, pltc 161 K/uL. Patient with Scr of 1.41 mg/dL (CrCl ~09 mL/min) - no baseline renal dose adjustments required. LFT panel WNL. Patient most recent EKG from 12/04/23 (QTcB ), patient will have repeat baseline EKG in clinic today. Prescription dose and frequency assessed for appropriateness.   Patient will take Krazati 200 mg tablets, 3 tablets (600 mg total) by mouth 2 (two) times daily. Recommended patient take Krazati with food to decrease GI upset.  Patient knows to avoid grapefruit and grapefruit juice while on Krazati.   Start date: pending medication approval and acquisition - will update encounter with specific start date.   Side effects include but are not limited to: diarrhea, nausea/vomiting, hepatotoxicity, edema, fatigue, decrease in blood counts. Also reviewed rare but serious side effect of Qtc prolongation as well as pneumonitis that have been reported with use of medication.  Nausea/Vomiting: Patient has anti-emetic on hand and knows to take it if nausea develops. We discussed OK to use prochlorperazine  patient has on hand at home PRN, but try to avoid Zofran  due to risk of Qtc prolongation. Diarrhea: Patient will obtain anti diarrheal and alert the office of 4 or more loose stools above baseline.    Reviewed with patient importance of keeping a medication schedule and plan for any missed doses.   After discussion with patient no patient barriers to medication adherence  identified.    Current medication list in Epic reviewed, DDIs with Krazati identified: Category D drug-drug interaction between Krazati and Lexapro  due to risk of Qtc prolongation. Patient educated about this risk and will have baseline EKG obtained in clinic on 01/13/24. No changes in therapy warranted at this time. Category D drug-drug interaction between Krazati and Trazodone  - Krazati, a strong CYP3A4 inhibitor may increase serum concentrations, thus risk of side effects from Krazati. Educated patient on this and discussed possibly decreasing trazodone  dose if needed. Patient stated she currently only takes trazodone  100 mg at bedtime (not 200 mg as written) and knows to decrease trazodone  dose if she feels excessive fatigue/sedation from trazodone .  Category C drug-drug interaction between Krazati and Atorvastatin  - Krazati, a strong CYP3A4 inhibitor may increase serum concentrations of atorvastatin , thus increase side effects from atorvastatin . Patient on 20 mg/d of atorvastatin  and LFTs WNL. No changes in therapy warranted at this time.   Patient agreement for treatment documented in MD note on 01/13/24.  All questions answered.  [name] voiced understanding and appreciation.   Medication education handout given to patient. Patient knows to call the office with questions or concerns. Oral Chemotherapy Clinic phone number provided to patient.  Jude Norton, PharmD, BCPS, BCOP Hematology/Oncology Clinical Pharmacist [name] and [facility] Oral Chemotherapy Navigation Clinics [phone] 01/13/2024 2:14 PM

## 2024-01-13 NOTE — Progress Notes (Signed)
 [facility] [phone]   [phone]  OFFICE PROGRESS NOTE  Lanae Pinal, MD [address] [place] [address]  DIAGNOSIS:  1) recurrent/metastatic non-small cell lung cancer, adenocarcinoma presented with metastatic adenopathy in the thorax as well as mediastinum, axillary lymph nodes and potential local recurrence at the right hilum along the inferior margin of the resection staple line with bilateral hypermetabolic adrenal metastasis and hypermetabolic bone metastasis to the T11 vertebral body as well as brain metastasis diagnosed in December 2024. 2) incidental bilaterally pulmonary embolism diagnosed on CT scan of the chest on October 22, 2023 2) Stage IA (T1a, N0, M0) non-small cell lung cancer, adenocarcinoma presented with right upper lobe pulmonary nodule   Detected Alteration(s) / Biomarker(s) Associated FDA-approved therapies Clinical Trial Availability % cfDNA or Amplification KRAS G12C approved by FDA Adagrasib, Sotorasib approved in other indication Adagrasib+cetuximab Yes 2.8%  TP53 E204* None Yes 2.0%   PRIOR THERAPY:  1) S/P right VATS with right upper lobectomy and mediastinal lymph node dissection under the care of Dr. Luna Salinas on October 15, 2016. 2) whole brain irradiation under the care of of Dr. [name]. 3) Systemic chemotherapy with carboplatin  for AUC of 5, Alimta 500 Mg/M2 and Keytruda  200 Mg IV every 3 weeks.  First dose August 20, 2022.  Status post 7   cycles.  Starting from cycle #5 she has been on maintenance treatment with Alimta and Keytruda  every 3 weeks for 3 more cycles.  Last dose was given on December 29, 2023 discontinued secondary to disease progression CURRENT THERAPY:  1) adagrasib 600 mg p.o. twice daily.  Expected to start in the next few days 2) Eliquis  starter kit with 10 mg p.o. twice daily for 7 days followed by 5 mg p.o. daily.  First dose started October 18, 2023.   INTERVAL  HISTORY: [name] 68 y.o. female returns to the clinic today for annual follow-up visit. Discussed the use of AI scribe software for clinical note transcription with the patient, who gave verbal consent to proceed.  History of Present Illness   [name] is a 68 year old female with recurrent and metastatic non-small cell lung cancer who presents for follow-up after recent imaging.  She has a history of recurrent and metastatic non-small cell lung cancer, adenocarcinoma subtype, diagnosed in December 2024 with a positive KRAS G12C mutation. She has undergone whole brain irradiation for metastatic disease to the brain.  She has been receiving palliative systemic chemotherapy with carboplatin , pemetrexed , and pembrolizumab  every three weeks for three months. She experiences some difficulties during this treatment regimen, including feeling 'a little off balance' as her only complaint since her last visit. She speculated that she might need a blood transfusion, but her blood work is reportedly still reasonably okay.  She is not currently on blood thinners, but there was a previous concern about a blood clot. Her previous scan did not show significant findings related to the blood clot, suggesting possible resolution.  She is supported by her neighbors, indicating a strong social support network.       MEDICAL HISTORY: Past Medical History:  Diagnosis Date   Adenocarcinoma of right lung, stage 1 (HCC) 11/29/2016   Chronic pain    Depression    takes Lexapro  daily   Diabetes mellitus    takes Metformin  daily and has an insulin  pump.Fasting blood sugar runs250   GERD (gastroesophageal reflux disease)    takes Pantoprazole  daily  History of colon polyps    benign   Hyperlipidemia    takes Atorvastatin  daily   Hypertension    takes Amlodipine  and Lisinopril  daily   Insomnia    takes Trazodone  nightly   Nocturia    recurrent lung ca with mets to brain, LN, bone and adrenal  gland 06/2023   Thyroid  disease    Urinary frequency    Urinary urgency     ALLERGIES:  is allergic to synthroid [levothyroxine sodium], voltaren  [diclofenac  sodium], hydrochlorothiazide, and iodine.  MEDICATIONS:  Current Outpatient Medications  Medication Sig Dispense Refill   ACCU-CHEK AVIVA PLUS test strip USE TO MONITOR GLUCOSE LEVELS TWICE A DAY E11.9 100 strip 12   ACCU-CHEK SOFTCLIX LANCETS lancets Use to monitor glucose levels BID; E11.9 (Patient taking differently: 1 each by Other route 2 (two) times daily. E11.9) 100 each 12   acetaminophen  (TYLENOL ) 500 MG tablet Take 1 tablet (500 mg total) by mouth every 6 (six) hours as needed. (Patient taking differently: Take 500 mg by mouth every 6 (six) hours as needed (for pain).) 30 tablet 0   atorvastatin  (LIPITOR) 20 MG tablet Take 20 mg by mouth daily.  11   Blood Glucose Calibration (GLUCOSE CONTROL) SOLN 1 Bottle by In Vitro route as needed. Use to calibrate Accu-Chek Aviva Plus device prn; please provide control solution compatible with Accu-Chek Aviva Plus device. (Patient taking differently: 1 each by Other route as needed (Use to calibrate glucometer). E11.9) 1 each 1   Blood Glucose Monitoring Suppl (ACCU-CHEK AVIVA PLUS) w/Device KIT 1 each by Does not apply route 2 (two) times daily. Use to monitor glucose levels BID; E11.9 (Patient taking differently: 1 each by Does not apply route 2 (two) times daily. E11.9) 1 kit 0   clotrimazole  (MYCELEX ) 10 MG troche Take 10 mg by mouth 5 (five) times daily.     escitalopram  (LEXAPRO ) 10 MG tablet Take 1 tablet (10 mg total) by mouth every morning. 90 tablet 3   ferrous sulfate  325 (65 FE) MG tablet Take 1 tablet (325 mg total) by mouth 2 (two) times daily with a meal. 60 tablet 0   folic acid  (FOLVITE ) 1 MG tablet Take 1 tablet (1 mg total) by mouth daily. Start 7 days before pemetrexed  chemotherapy. Continue until 21 days after pemetrexed  completed. 100 tablet 3   insulin  glargine (LANTUS   SOLOSTAR) 100 UNIT/ML Solostar Pen Inject 15 Units into the skin at bedtime. (Patient taking differently: Inject 15 Units into the skin at bedtime as needed (high bs).) 15 mL 3   Insulin  Pen Needle (B-D ULTRAFINE III SHORT PEN) 31G X 8 MM MISC USE AS DIRECTED 100 each 5   lidocaine  (XYLOCAINE ) 2 % solution every 6 (six) hours as needed for mouth pain. Apply topically to mouth for sores     lidocaine -prilocaine  (EMLA ) cream Apply to affected area once (Patient taking differently: Apply 1 Application topically once. Apply to affected area once) 30 g 3   magic mouthwash SOLN Take 5 mLs by mouth 3 (three) times daily as needed for mouth pain. Suspension contains equal amounts of Maalox Extra Strength, nystatin, and diphenhydramine .     memantine  (NAMENDA ) 10 MG tablet Take 1 tablet (10 mg total) by mouth 2 (two) times daily. Take through 01-20-24, then stop. 180 tablet 0   ondansetron  (ZOFRAN ) 8 MG tablet Take 1 tablet (8 mg total) by mouth every 8 (eight) hours as needed for nausea or vomiting. Start on the third day after carboplatin . 30  tablet 1   pantoprazole  (PROTONIX ) 40 MG tablet TAKE 1 TABLET BY MOUTH TWICE A DAY 30 MINS BEFORE YOUR FIRST AND LAST MEAL. 180 tablet 1   prochlorperazine  (COMPAZINE ) 10 MG tablet Take 1 tablet (10 mg total) by mouth every 6 (six) hours as needed for nausea or vomiting. 30 tablet 1   traZODone  (DESYREL ) 100 MG tablet Take 2 tablets (200 mg total) by mouth at bedtime. 180 tablet 3   No current facility-administered medications for this visit.    SURGICAL HISTORY:  Past Surgical History:  Procedure Laterality Date   ABDOMINAL HYSTERECTOMY     ABDOMINAL SURGERY     BIOPSY  05/28/2019   Procedure: BIOPSY;  Surgeon: Alyce Jubilee, MD;  [facility]: [facility];  Service: Endoscopy;;  random colon/gastric/duodenum   BIOPSY  01/11/2023   Procedure: BIOPSY;  Surgeon: Vinetta Greening, DO;  [facility]: [facility];  Service: Endoscopy;;   COLONOSCOPY  2005 [facility]   TICs,  IH   COLONOSCOPY N/A 03/08/2014   Procedure: COLONOSCOPY;  Surgeon: Alyce Jubilee, MD;  [facility]: [facility];  Service: Endoscopy;  Laterality: N/A;  1015   COLONOSCOPY WITH PROPOFOL  N/A 05/28/2019   Procedure: COLONOSCOPY WITH PROPOFOL ;  Surgeon: Alyce Jubilee, MD;  [facility];  Service: Endoscopy;  Laterality: N/A;  9:15am   CYSTOSTOMY  11/22/2011   Procedure: CYSTOSTOMY SUPRAPUBIC;  Surgeon: Reggie Caper, MD;  Location: [facility];  Service: Urology;  Laterality: N/A;   ESOPHAGOGASTRODUODENOSCOPY N/A 03/08/2014   Procedure: ESOPHAGOGASTRODUODENOSCOPY (EGD);  Surgeon: Alyce Jubilee, MD;  [facility];  Service: Endoscopy;  Laterality: N/A;   ESOPHAGOGASTRODUODENOSCOPY     ESOPHAGOGASTRODUODENOSCOPY (EGD) WITH PROPOFOL  N/A 05/28/2019   Procedure: ESOPHAGOGASTRODUODENOSCOPY (EGD) WITH PROPOFOL ;  Surgeon: Alyce Jubilee, MD;  [facility];  Service: Endoscopy;  Laterality: N/A;   ESOPHAGOGASTRODUODENOSCOPY (EGD) WITH PROPOFOL  N/A 01/11/2023   Procedure: ESOPHAGOGASTRODUODENOSCOPY (EGD) WITH PROPOFOL ;  Surgeon: Vinetta Greening, DO;  [facility];  Service: Endoscopy;  Laterality: N/A;  9:45 am, asa 3   GIVENS CAPSULE STUDY N/A 06/15/2019   Procedure: GIVENS CAPSULE STUDY;  Surgeon: Alyce Jubilee, MD;  [facility];  Service: Endoscopy;  Laterality: N/A;  7:30am   HALLUX VALGUS CORRECTION     bilateral foot surgery for bone repairs-multiple   IR IMAGING GUIDED PORT INSERTION  08/19/2023   LOBECTOMY Right 10/15/2016   Procedure: RIGHT UPPER LOBECTOMY;  Surgeon: Zelphia Higashi, MD;  Location: [facility] OR;  Service: Thoracic;  Laterality: Right;   LYMPH NODE DISSECTION Right 10/15/2016   Procedure: LYMPH NODE DISSECTION;  Surgeon: Zelphia Higashi, MD;  Location: [facility] OR;  Service: Thoracic;  Laterality: Right;   POLYPECTOMY  05/28/2019   Procedure: POLYPECTOMY;  Surgeon: Alyce Jubilee, MD;  [facility];  Service:  Endoscopy;;   SAVORY DILATION N/A 05/28/2019   Procedure: SAVORY DILATION;  Surgeon: Alyce Jubilee, MD;  [facility];  Service: Endoscopy;  Laterality: N/A;  15/16/17   VAGINA RECONSTRUCTION SURGERY     tvt 2006- [facility]    VIDEO ASSISTED THORACOSCOPY (VATS)/WEDGE RESECTION Right 10/15/2016   Procedure: RIGHT VIDEO ASSISTED THORACOSCOPY (VATS)/WEDGE RESECTION;  Surgeon: Zelphia Higashi, MD;  Location: [facility] OR;  Service: Thoracic;  Laterality: Right;    REVIEW OF SYSTEMS:  Constitutional: positive for fatigue Eyes: negative Ears, nose, mouth, throat, and face: negative Respiratory: positive for dyspnea on exertion Cardiovascular: negative Gastrointestinal: negative  Genitourinary:negative Integument/breast: negative Hematologic/lymphatic: negative Musculoskeletal:negative Neurological: positive for dizziness Behavioral/Psych: negative Endocrine: negative Allergic/Immunologic: negative   PHYSICAL EXAMINATION: General appearance: alert, cooperative, fatigued, and no distress Head: Normocephalic, without obvious abnormality, atraumatic Neck: no adenopathy, no JVD, supple, symmetrical, trachea midline, and thyroid  not enlarged, symmetric, no tenderness/mass/nodules Lymph nodes: Cervical, supraclavicular, and axillary nodes normal. Resp: clear to auscultation bilaterally Back: symmetric, no curvature. ROM normal. No CVA tenderness. Cardio: regular rate and rhythm, S1, S2 normal, no murmur, click, rub or gallop GI: soft, non-tender; bowel sounds normal; no masses,  no organomegaly Extremities: extremities normal, atraumatic, no cyanosis or edema Neurologic: Alert and oriented X 3, normal strength and tone. Normal symmetric reflexes. Normal coordination and gait  ECOG PERFORMANCE STATUS: 1 - Symptomatic but completely ambulatory  Blood pressure (!) 161/63, pulse 80, temperature 98 F (36.7 C), temperature source Temporal, resp. rate 18, height 5' 7 (1.702 m),  weight 164 lb (74.4 kg), SpO2 100%.  LABORATORY DATA: Lab Results  Component Value Date   WBC 4.5 01/13/2024   HGB 8.2 (L) 01/13/2024   HCT 23.7 (L) 01/13/2024   MCV 90.1 01/13/2024   PLT 129 (L) 01/13/2024      Chemistry      Component Value Date/Time   NA 138 01/13/2024 1232   NA 142 10/29/2022 1158   NA 141 05/29/2017 0921   K 3.9 01/13/2024 1232   K 4.8 05/29/2017 0921   CL 105 01/13/2024 1232   CO2 26 01/13/2024 1232   CO2 28 05/29/2017 0921   BUN 12 01/13/2024 1232   BUN 21 10/29/2022 1158   BUN 13.5 05/29/2017 0921   CREATININE 1.41 (H) 01/13/2024 1232   CREATININE 0.8 05/29/2017 0921      Component Value Date/Time   CALCIUM  8.6 (L) 01/13/2024 1232   CALCIUM  9.5 05/29/2017 0921   ALKPHOS 63 01/13/2024 1232   ALKPHOS 104 05/29/2017 0921   AST 20 01/13/2024 1232   AST 17 05/29/2017 0921   ALT 9 01/13/2024 1232   ALT 20 05/29/2017 0921   BILITOT 0.4 01/13/2024 1232   BILITOT 0.40 05/29/2017 0921       RADIOGRAPHIC STUDIES: CT CHEST ABDOMEN PELVIS WO CONTRAST Result Date: 01/07/2024 CLINICAL DATA:  Metastatic lung cancer restaging, ongoing chemotherapy * Tracking Code: BO * EXAM: CT CHEST, ABDOMEN AND PELVIS WITHOUT CONTRAST TECHNIQUE: Multidetector CT imaging of the chest, abdomen and pelvis was performed following the standard protocol without IV contrast. RADIATION DOSE REDUCTION: This exam was performed according to the departmental dose-optimization program which includes automated exposure control, adjustment of the mA and/or kV according to patient size and/or use of iterative reconstruction technique. COMPARISON:  10/22/2023 FINDINGS: CT CHEST FINDINGS Cardiovascular: Right chest port catheter. Aortic atherosclerosis. Normal heart size. New small pericardial effusion. Mediastinum/Nodes: Interval enlargement of bilateral axillary and subpectoral lymph nodes, largest index node in the right axilla measuring 3.0 x 2.0 cm, previously 2.3 x 1.5 cm (series 2, image  14). No enlarged mediastinal or hilar lymph nodes. Thyroid  gland, trachea, and esophagus demonstrate no significant findings. Lungs/Pleura: Status post right upper lobectomy. No pleural effusion or pneumothorax. Musculoskeletal: No chest wall abnormality. No acute osseous findings. CT ABDOMEN PELVIS FINDINGS Hepatobiliary: No solid liver abnormality is seen. No gallstones, gallbladder wall thickening, or biliary dilatation. Pancreas: Unremarkable. No pancreatic ductal dilatation or surrounding inflammatory changes. Spleen: Normal in size. Hypodense splenic lesions are very poorly appreciated on this noncontrast examination, difficult to compare for interval change (series 2, image 62). Adrenals/Urinary Tract: Slight  interval enlargement of a right adrenal nodule measuring 2.2 x 1.3 cm, previously 1.8 x 0.9 cm (series 2, image 61). Slight interval enlargement of a left adrenal nodule measuring 2.6 x 2.2 cm, previously 2.0 x 2.0 cm when measured similarly (series 2, image 63). Kidneys are normal, without renal calculi, solid lesion, or hydronephrosis. Unchanged calculus in the bladder measuring 1.2 cm (series 5, image 110). Stomach/Bowel: Stomach is within normal limits. Appendix appears normal. No evidence of bowel wall thickening, distention, or inflammatory changes. Sigmoid diverticulosis. Vascular/Lymphatic: Aortic atherosclerosis. Unchanged prominent subcentimeter retroperitoneal lymph nodes (series 2, image 85). Reproductive: No mass or other abnormality. Other: No abdominal wall hernia or abnormality. No ascites. Musculoskeletal: No acute osseous findings. Unchanged sclerotic metastasis of T11 (series 5, image 108). IMPRESSION: 1. Interval enlargement of bilateral axillary and subpectoral lymph nodes, consistent with worsened nodal metastases. 2. Slight interval enlargement of bilateral adrenal nodules, consistent with enlarging. 3. Hypodense splenic lesions are very poorly appreciated on this noncontrast  examination, difficult to compare for interval change. 4. Unchanged prominent subcentimeter retroperitoneal lymph nodes. 5. Unchanged sclerotic metastasis of T11. 6. New small pericardial effusion. 7. Status post right upper lobectomy. Aortic Atherosclerosis (ICD10-I70.0). Electronically Signed   By: Fredricka Jenny M.D.   On: 01/07/2024 21:32      ASSESSMENT AND PLAN: This is a very pleasant 68 years old white female with stage IV (T1a, N3, M1 C) non-small cell lung cancer initially diagnosed as stage IA non-small cell lung cancer status post right upper lobectomy with lymph node dissection.  This was diagnosed in March 2018 with evidence for disease recurrence and metastasis in December 2024. The patient has been on observation and she is feeling fine today with no concerning complaints. The patient had repeat CT scan of the chest performed recently.  I personally independently reviewed the scan images and discussed the results with the patient today.  Unfortunately her scan showed interval development of several abnormal lymph nodes throughout the chest including right hilar, mediastinal, supraclavicular and bilateral axillary as well as upper abdominal lymphadenopathy.  There was also increasing right adrenal nodule worrisome for adrenal metastasis. She underwent ultrasound-guided core biopsy of right axillary lymph node and the final pathology was consistent with metastatic adenocarcinoma of lung primary.  We will send the tissue block to [facility] for molecular studies and PD-L1 expression.  Molecular studies by [id number] showed positive KRAS G12C mutation. She underwent whole brain radiation for brain metastasis. The patient is here today to start the first dose of systemic chemotherapy with carboplatin  for AUC of 5, Alimta 500 Mg/M2 and Keytruda  200 Mg IV every 3 weeks.  First dose August 21, 2023.  Status post 7 cycles. She has been tolerating her treatment fairly well except for the recent  syncopal episode secondary to anemia. She had repeat CT scan of the chest, abdomen and pelvis performed recently.  I personally and independently reviewed the scan and discussed the results with the patient today.  Unfortunately her scan showed evidence for disease progression with increasing bilateral axillary lymph node as well as adrenal gland lesions.     Recurrent and metastatic non-small cell lung cancer Recurrent and metastatic non-small cell lung cancer with adenocarcinoma subtype. Positive for KRAS G12C mutation. Previous treatment included whole brain irradiation and systemic chemotherapy with carboplatin , pemetrexed , and pembrolizumab . Recent scans show progression with increased axillary, subscapular, and adrenal gland lesions. Current treatment plan involves switching to targeted therapy due to progression and previous treatment becoming ineffective. Adagrasib (  Krazati) is chosen for its lower capsule burden compared to sotorasib. Potential side effects include diarrhea, gastrointestinal upset, fever, and renal or hepatic effects. - Initiate Adagrasib Kyle Pho) therapy, pending approval and teaching by pharmacist. - Arrange for pharmacist consultation to educate on Adagrasib administration and side effects. - Monitor for potential side effects including diarrhea, gastrointestinal upset, fever, and renal or hepatic effects. - Perform port flushes as needed, less frequently than during chemotherapy.  Metastatic brain disease Metastatic brain disease previously treated with whole brain irradiation. No new specific concerns or changes discussed in this encounter.  Blood clot (resolved) Previous blood clot appears resolved based on recent scan. Continued anticoagulation therapy is necessary despite resolution on imaging to prevent recurrence. - Continue anticoagulation therapy.   The patient was advised to call immediately if she has any other concerning symptoms in the interval.   The  patient understands that this treatment is palliative in nature and there is no cure for her condition.  The goal of treatment is prolongation of life with palliation of her symptoms. The patient voices understanding of current disease status and treatment options and is in agreement with the current care plan. All questions were answered. The patient knows to call the clinic with any problems, questions or concerns. We can certainly see the patient much sooner if necessary.  Disclaimer: This note was dictated with voice recognition software. Similar sounding words can inadvertently be transcribed and may not be corrected upon review.

## 2024-01-13 NOTE — Progress Notes (Signed)
 DISCONTINUE ON PATHWAY REGIMEN - Non-Small Cell Lung     A cycle is every 21 days:     Pembrolizumab       Pemetrexed       Carboplatin    **Always confirm dose/schedule in your pharmacy ordering system**  PRIOR TREATMENT: [id number]: Pembrolizumab  200 mg + Pemetrexed  500 mg/m2 + Carboplatin  AUC=5 q21 Days x 4 Cycles  START OFF PATHWAY REGIMEN - Non-Small Cell Lung   [id number]:Adagrasib 600 mg PO BID D1-28 q28 Days:   A cycle is every 28 days:     Adagrasib   **Always confirm dose/schedule in your pharmacy ordering system**  Patient Characteristics: Stage IV Metastatic, Nonsquamous, Molecular Analysis Completed, Molecular Alteration Present and Eligible for Molecular Targeted Therapy, Second Line - Molecular Targeted Therapy, KRAS G12C Mutation Positive Therapeutic Status: Stage IV Metastatic Histology: Nonsquamous Cell Broad Molecular Profiling Status: Molecular Analysis Completed Molecular Analysis Results: Alteration Present and Eligible for Molecular Targeted Therapy Molecular Alteration Present: KRAS G12C Mutation Positive Molecular Targeted Line of Therapy: Second Automotive engineer Targeted Therapy Intent of Therapy: Non-Curative / Palliative Intent, Discussed with Patient

## 2024-01-14 ENCOUNTER — Other Ambulatory Visit: Payer: Self-pay | Admitting: Pharmacy Technician

## 2024-01-14 ENCOUNTER — Other Ambulatory Visit: Payer: Self-pay

## 2024-01-14 ENCOUNTER — Other Ambulatory Visit (HOSPITAL_COMMUNITY): Payer: Self-pay

## 2024-01-14 NOTE — Progress Notes (Signed)
 Specialty Pharmacy Initial Fill Coordination Note  [name] is a 68 y.o. female contacted today regarding refills of specialty medication(s) Adagrasib (KRAZATI) .  Patient requested Delivery  on 01/16/24  to verified address [address] [place] [address]   Medication will be filled on 01/15/24.   Patient is aware of $0 copayment. Ship 01/15/24

## 2024-01-14 NOTE — Telephone Encounter (Signed)
 Oral Oncology Patient Advocate Encounter  Called [phone] to check PA status. - CMM had pulled an Community education officer form, but PAs go through [employer] Initiated and submitted PA over the phone with [facility].   [phone] fax  PA # [id number] - meets initial criteria. Need chart notes. Will get determination letter within 24 hrs of receiving those chart notes.  Chart notes faxed to # above.   Roda Cirri, CPhT Specialty Pharmacy Patient Advocate Phone: [phone] Fax: [phone]

## 2024-01-14 NOTE — Progress Notes (Signed)
 Oral Chemotherapy Pharmacist Encounter  Patient was counseled under telephone encounter from 01/13/24.  Jude Norton, PharmD, BCPS, BCOP Hematology/Oncology Clinical Pharmacist [facility] and [facility] Oral Chemotherapy Navigation Clinics [phone] 01/14/2024 3:40 PM

## 2024-01-14 NOTE — Telephone Encounter (Addendum)
 Oral Oncology Patient Advocate Encounter  Called pt to onboard with [facility] specialty pharmacy. (Pharmacist already counseled)  Call attempt 1 - 01/14/24 11:45am- will go ahead and ask for pharmacy to order so that we can have it ready to fill when she calls back, if it's not today.  Roda Cirri, CPhT Specialty Pharmacy Patient Advocate Phone: [phone] Fax: [phone]

## 2024-01-14 NOTE — Telephone Encounter (Signed)
 Oral Oncology Patient Advocate Encounter  Prior Authorization for Krazati has been approved.    PA# [id number] [place] Effective dates: 01/14/24 through 01/13/25  Patients co-pay is $0.   Roda Cirri, CPhT Specialty Pharmacy Patient Advocate Phone: [phone] Fax: [phone]

## 2024-01-15 ENCOUNTER — Other Ambulatory Visit: Payer: Self-pay

## 2024-01-15 ENCOUNTER — Other Ambulatory Visit (HOSPITAL_COMMUNITY): Payer: Self-pay

## 2024-01-16 NOTE — Progress Notes (Unsigned)
 [facility] OFFICE PROGRESS NOTE  Koirala, Dibas, MD [address] [place] [address]  DIAGNOSIS: 1) recurrent/metastatic non-small cell lung cancer, adenocarcinoma presented with metastatic adenopathy in the thorax as well as mediastinum, axillary lymph nodes and potential local recurrence at the right hilum along the inferior margin of the resection staple line with bilateral hypermetabolic adrenal metastasis and hypermetabolic bone metastasis to the T11 vertebral body as well as brain metastasis diagnosed in December 2024. 2) incidental bilaterally pulmonary embolism diagnosed on CT scan of the chest on October 22, 2023 2) Stage IA (T1a, N0, M0) non-small cell lung cancer, adenocarcinoma presented with right upper lobe pulmonary nodule    Detected Alteration(s) / Biomarker(s)Associated FDA-approved therapiesClinical Trial Availability% cfDNA or Amplification KRAS G12C approved by FDA Adagrasib , Sotorasib approved in other indication Adagrasib +cetuximab Yes2.8%   TP53 E204* None Yes2.0%  PRIOR THERAPY: 1) S/P right VATS with right upper lobectomy and mediastinal lymph node dissection under the care of Dr. Kerrin on October 15, 2016. 2) whole brain irradiation under the care of of Dr. Izell. 3) Systemic chemotherapy with carboplatin  for AUC of 5, Alimta 500 Mg/M2 and Keytruda  200 Mg IV every 3 weeks.  First dose August 20, 2022.  Status post 7   cycles.  Starting from cycle #5 she has been on maintenance treatment with Alimta and Keytruda  every 3 weeks for 3 more cycles.  Last dose was given on December 29, 2023 discontinued secondary to disease progression  CURRENT THERAPY: 1) adagrasib  600 mg p.o. twice daily.  First dose on ~01/16/24. Her dose will be reduced to 400 mg p.o. BID starting from 01/23/24 2) Eliquis  starter kit with 10 mg p.o. twice daily for 7 days followed by 5 mg p.o. daily.  First dose started October 18, 2023.  INTERVAL HISTORY: KATHERINA WIMER 68 y.o. female returns to the clinic today for a follow-up visit.  The patient was last seen in the clinic by Dr. Sherrod on 01/13/2024.   At that point in time her repeat imaging study showed evidence of disease progression.  Therefore Dr. Sherrod started her on oral treatment with Krazati . She states this has not been going well.  He is experienced gastrointestinal symptoms since starting this medication leading to 10 pound weight loss.  She reports daily nausea and vomiting primarily with yellow bile despite taking what sounds like is Zofran  and Compazine .  She also has diarrhea 2-4 times per day which is described as watery.  She has some relief with Imodium which she only takes once daily.  She does have some Glucerna at home. She reports she has an appetite and eats.   She drinks a lot of fluid.  She drinks 3 large 32 ounce cups of water  daily.  She denies any abdominal pain or blood in the stool.  She does bruise easily and she is not on any blood thinners.  She is taking an iron  supplement for her anemia.   She denies any shortness of breath, chest discomfort, or unusual cough.  She denies any fever or night sweats.  She denies any rashes or skin changes.  She is here today for evaluation and repeat blood work.   She is here today for evaluation and repeat blood work.   MEDICAL HISTORY: Past Medical History:  Diagnosis Date   Adenocarcinoma of right lung, stage 1 (HCC) 11/29/2016   Chronic pain    Depression    takes Lexapro  daily   Diabetes mellitus  takes Metformin  daily and has an insulin  pump.Fasting blood sugar runs250   GERD (gastroesophageal reflux disease)    takes Pantoprazole  daily   History of colon polyps    benign   Hyperlipidemia    takes Atorvastatin  daily   Hypertension    takes Amlodipine  and Lisinopril  daily   Insomnia    takes Trazodone  nightly   Nocturia    recurrent lung ca with mets to brain, LN, bone and adrenal gland 06/2023   Thyroid  disease     Urinary frequency    Urinary urgency     ALLERGIES:  is allergic to synthroid [levothyroxine sodium], voltaren  [diclofenac  sodium], hydrochlorothiazide, and iodine.  MEDICATIONS:  Current Outpatient Medications  Medication Sig Dispense Refill   ACCU-CHEK AVIVA PLUS test strip USE TO MONITOR GLUCOSE LEVELS TWICE A DAY E11.9 100 strip 12   ACCU-CHEK SOFTCLIX LANCETS lancets Use to monitor glucose levels BID; E11.9 (Patient taking differently: 1 each by Other route 2 (two) times daily. E11.9) 100 each 12   acetaminophen  (TYLENOL ) 500 MG tablet Take 1 tablet (500 mg total) by mouth every 6 (six) hours as needed. (Patient taking differently: Take 500 mg by mouth every 6 (six) hours as needed (for pain).) 30 tablet 0   adagrasib  (KRAZATI ) 200 MG tablet Take 3 tablets (600 mg total) by mouth 2 (two) times daily. 180 tablet 3   atorvastatin  (LIPITOR) 20 MG tablet Take 20 mg by mouth daily.  11   Blood Glucose Calibration (GLUCOSE CONTROL) SOLN 1 Bottle by In Vitro route as needed. Use to calibrate Accu-Chek Aviva Plus device prn; please provide control solution compatible with Accu-Chek Aviva Plus device. (Patient taking differently: 1 each by Other route as needed (Use to calibrate glucometer). E11.9) 1 each 1   Blood Glucose Monitoring Suppl (ACCU-CHEK AVIVA PLUS) w/Device KIT 1 each by Does not apply route 2 (two) times daily. Use to monitor glucose levels BID; E11.9 (Patient taking differently: 1 each by Does not apply route 2 (two) times daily. E11.9) 1 kit 0   clotrimazole  (MYCELEX ) 10 MG troche Take 10 mg by mouth 5 (five) times daily.     escitalopram  (LEXAPRO ) 10 MG tablet Take 1 tablet (10 mg total) by mouth every morning. 90 tablet 3   ferrous sulfate  325 (65 FE) MG tablet Take 1 tablet (325 mg total) by mouth 2 (two) times daily with a meal. 60 tablet 0   insulin  glargine (LANTUS  SOLOSTAR) 100 UNIT/ML Solostar Pen Inject 15 Units into the skin at bedtime. (Patient taking differently: Inject 15  Units into the skin at bedtime as needed (high bs).) 15 mL 3   Insulin  Pen Needle (B-D ULTRAFINE III SHORT PEN) 31G X 8 MM MISC USE AS DIRECTED 100 each 5   lidocaine  (XYLOCAINE ) 2 % solution every 6 (six) hours as needed for mouth pain. Apply topically to mouth for sores     magic mouthwash SOLN Take 5 mLs by mouth 3 (three) times daily as needed for mouth pain. Suspension contains equal amounts of Maalox Extra Strength, nystatin, and diphenhydramine .     memantine  (NAMENDA ) 10 MG tablet Take 1 tablet (10 mg total) by mouth 2 (two) times daily. Take through 01-20-24, then stop. 180 tablet 0   pantoprazole  (PROTONIX ) 40 MG tablet TAKE 1 TABLET BY MOUTH TWICE A DAY 30 MINS BEFORE YOUR FIRST AND LAST MEAL. 180 tablet 1   traZODone  (DESYREL ) 100 MG tablet Take 2 tablets (200 mg total) by mouth at bedtime. 180 tablet  3   No current facility-administered medications for this visit.    SURGICAL HISTORY:  Past Surgical History:  Procedure Laterality Date   ABDOMINAL HYSTERECTOMY     ABDOMINAL SURGERY     BIOPSY  05/28/2019   Procedure: BIOPSY;  Surgeon: Harvey Margo CROME, MD;  Location: [facility];  Service: Endoscopy;;  random colon/gastric/duodenum   BIOPSY  01/11/2023   Procedure: BIOPSY;  Surgeon: Cindie Carlin POUR, DO;  Location: [facility];  Service: Endoscopy;;   COLONOSCOPY  2005 MAC   TICs, IH   COLONOSCOPY N/A 03/08/2014   Procedure: COLONOSCOPY;  Surgeon: Margo CROME Harvey, MD;  Location: [facility];  Service: Endoscopy;  Laterality: N/A;  1015   COLONOSCOPY WITH PROPOFOL  N/A 05/28/2019   Procedure: COLONOSCOPY WITH PROPOFOL ;  Surgeon: Harvey Margo CROME, MD;  Location: [facility];  Service: Endoscopy;  Laterality: N/A;  9:15am   CYSTOSTOMY  11/22/2011   Procedure: CYSTOSTOMY SUPRAPUBIC;  Surgeon: Emery LILLETTE Blaze, MD;  Location: [facility];  Service: Urology;  Laterality: N/A;   ESOPHAGOGASTRODUODENOSCOPY N/A 03/08/2014   Procedure: ESOPHAGOGASTRODUODENOSCOPY (EGD);  Surgeon: Margo CROME Harvey, MD;  [facility]: [facility];  Service: Endoscopy;  Laterality: N/A;   ESOPHAGOGASTRODUODENOSCOPY     ESOPHAGOGASTRODUODENOSCOPY (EGD) WITH PROPOFOL  N/A 05/28/2019   Procedure: ESOPHAGOGASTRODUODENOSCOPY (EGD) WITH PROPOFOL ;  Surgeon: Harvey Margo CROME, MD;  [facility]: [facility];  Service: Endoscopy;  Laterality: N/A;   ESOPHAGOGASTRODUODENOSCOPY (EGD) WITH PROPOFOL  N/A 01/11/2023   Procedure: ESOPHAGOGASTRODUODENOSCOPY (EGD) WITH PROPOFOL ;  Surgeon: Cindie Carlin POUR, DO;  [facility]: [facility];  Service: Endoscopy;  Laterality: N/A;  9:45 am, asa 3   GIVENS CAPSULE STUDY N/A 06/15/2019   Procedure: GIVENS CAPSULE STUDY;  Surgeon: Harvey Margo CROME, MD;  [facility]: [facility];  Service: Endoscopy;  Laterality: N/A;  7:30am   HALLUX VALGUS CORRECTION     bilateral foot surgery for bone repairs-multiple   IR IMAGING GUIDED PORT INSERTION  08/19/2023   LOBECTOMY Right 10/15/2016   Procedure: RIGHT UPPER LOBECTOMY;  Surgeon: Elspeth JAYSON Millers, MD;  Location: [facility] OR;  Service: Thoracic;  Laterality: Right;   LYMPH NODE DISSECTION Right 10/15/2016   Procedure: LYMPH NODE DISSECTION;  Surgeon: Elspeth JAYSON Millers, MD;  Location: [facility] OR;  Service: Thoracic;  Laterality: Right;   POLYPECTOMY  05/28/2019   Procedure: POLYPECTOMY;  Surgeon: Harvey Margo CROME, MD;  [facility];  Service: Endoscopy;;   SAVORY DILATION N/A 05/28/2019   Procedure: SAVORY DILATION;  Surgeon: Harvey Margo CROME, MD;  [facility]: [facility];  Service: Endoscopy;  Laterality: N/A;  15/16/17   VAGINA RECONSTRUCTION SURGERY     tvt 2006- [place]    VIDEO ASSISTED THORACOSCOPY (VATS)/WEDGE RESECTION Right 10/15/2016   Procedure: RIGHT VIDEO ASSISTED THORACOSCOPY (VATS)/WEDGE RESECTION;  Surgeon: Elspeth JAYSON Millers, MD;  Location: [facility] OR;  Service: Thoracic;  Laterality: Right;    REVIEW OF SYSTEMS:   Review of Systems  Constitutional: Positive for weight loss and fatigue. Negative for appetite change,  chills, fatigue, fever and unexpected weight change.  HENT: Negative for mouth sores, nosebleeds, sore throat and trouble swallowing.   Eyes: Negative for eye problems and icterus.  Respiratory: Negative for cough, hemoptysis, shortness of breath and wheezing.   Cardiovascular: Negative for chest pain and leg swelling.  Gastrointestinal: Positive for nausea, vomiting, and diarrhea.  Negative for abdominal pain or constipation. Genitourinary: Negative for bladder incontinence, difficulty urinating, dysuria, frequency and hematuria.   Musculoskeletal: Negative for back pain,  gait problem, neck pain and neck stiffness.  Skin: Negative for itching and rash.  Neurological: Negative for dizziness, extremity weakness, gait problem, headaches, light-headedness and seizures.  Hematological: Negative for adenopathy. Does not bleed easily. Positive for easy bruising.  Psychiatric/Behavioral: Negative for confusion, depression and sleep disturbance. The patient is not nervous/anxious.     PHYSICAL EXAMINATION:  There were no vitals taken for this visit.  ECOG PERFORMANCE STATUS: 1  Physical Exam  Constitutional: Oriented to person, place, and time and well-developed, well-nourished, and in no distress. HENT:  Head: Normocephalic and atraumatic.  Mouth/Throat: Oropharynx is clear and moist. No oropharyngeal exudate.  Eyes: Conjunctivae are normal. Right eye exhibits no discharge. Left eye exhibits no discharge. No scleral icterus.  Neck: Normal range of motion. Neck supple.  Cardiovascular: Normal rate, regular rhythm, normal heart sounds and intact distal pulses.   Pulmonary/Chest: Effort normal and breath sounds normal. No respiratory distress. No wheezes. No rales.  Abdominal: Soft. Bowel sounds are normal. Exhibits no distension and no mass. There is no tenderness.  Musculoskeletal: Normal range of motion. Exhibits no edema.  Lymphadenopathy:    No cervical adenopathy.  Neurological: Alert and  oriented to person, place, and time. Exhibits muscle wasting. Gait normal. Coordination normal.  Skin: Skin is warm and dry. Positive for extremity bruising. No rash noted. Not diaphoretic. No erythema. No pallor.  Psychiatric: Mood, memory and judgment normal.  Vitals reviewed.  LABORATORY DATA: Lab Results  Component Value Date   WBC 4.5 01/13/2024   HGB 8.2 (L) 01/13/2024   HCT 23.7 (L) 01/13/2024   MCV 90.1 01/13/2024   PLT 129 (L) 01/13/2024      Chemistry      Component Value Date/Time   NA 138 01/13/2024 1232   NA 142 10/29/2022 1158   NA 141 05/29/2017 0921   K 3.9 01/13/2024 1232   K 4.8 05/29/2017 0921   CL 105 01/13/2024 1232   CO2 26 01/13/2024 1232   CO2 28 05/29/2017 0921   BUN 12 01/13/2024 1232   BUN 21 10/29/2022 1158   BUN 13.5 05/29/2017 0921   CREATININE 1.41 (H) 01/13/2024 1232   CREATININE 0.8 05/29/2017 0921      Component Value Date/Time   CALCIUM  8.6 (L) 01/13/2024 1232   CALCIUM  9.5 05/29/2017 0921   ALKPHOS 63 01/13/2024 1232   ALKPHOS 104 05/29/2017 0921   AST 20 01/13/2024 1232   AST 17 05/29/2017 0921   ALT 9 01/13/2024 1232   ALT 20 05/29/2017 0921   BILITOT 0.4 01/13/2024 1232   BILITOT 0.40 05/29/2017 0921       RADIOGRAPHIC STUDIES:  CT CHEST ABDOMEN PELVIS WO CONTRAST Result Date: 01/07/2024 CLINICAL DATA:  Metastatic lung cancer restaging, ongoing chemotherapy * Tracking Code: BO * EXAM: CT CHEST, ABDOMEN AND PELVIS WITHOUT CONTRAST TECHNIQUE: Multidetector CT imaging of the chest, abdomen and pelvis was performed following the standard protocol without IV contrast. RADIATION DOSE REDUCTION: This exam was performed according to the departmental dose-optimization program which includes automated exposure control, adjustment of the mA and/or kV according to patient size and/or use of iterative reconstruction technique. COMPARISON:  10/22/2023 FINDINGS: CT CHEST FINDINGS Cardiovascular: Right chest port catheter. Aortic  atherosclerosis. Normal heart size. New small pericardial effusion. Mediastinum/Nodes: Interval enlargement of bilateral axillary and subpectoral lymph nodes, largest index node in the right axilla measuring 3.0 x 2.0 cm, previously 2.3 x 1.5 cm (series 2, image 14). No enlarged mediastinal or hilar lymph nodes. Thyroid  gland, trachea,  and esophagus demonstrate no significant findings. Lungs/Pleura: Status post right upper lobectomy. No pleural effusion or pneumothorax. Musculoskeletal: No chest wall abnormality. No acute osseous findings. CT ABDOMEN PELVIS FINDINGS Hepatobiliary: No solid liver abnormality is seen. No gallstones, gallbladder wall thickening, or biliary dilatation. Pancreas: Unremarkable. No pancreatic ductal dilatation or surrounding inflammatory changes. Spleen: Normal in size. Hypodense splenic lesions are very poorly appreciated on this noncontrast examination, difficult to compare for interval change (series 2, image 62). Adrenals/Urinary Tract: Slight interval enlargement of a right adrenal nodule measuring 2.2 x 1.3 cm, previously 1.8 x 0.9 cm (series 2, image 61). Slight interval enlargement of a left adrenal nodule measuring 2.6 x 2.2 cm, previously 2.0 x 2.0 cm when measured similarly (series 2, image 63). Kidneys are normal, without renal calculi, solid lesion, or hydronephrosis. Unchanged calculus in the bladder measuring 1.2 cm (series 5, image 110). Stomach/Bowel: Stomach is within normal limits. Appendix appears normal. No evidence of bowel wall thickening, distention, or inflammatory changes. Sigmoid diverticulosis. Vascular/Lymphatic: Aortic atherosclerosis. Unchanged prominent subcentimeter retroperitoneal lymph nodes (series 2, image 85). Reproductive: No mass or other abnormality. Other: No abdominal wall hernia or abnormality. No ascites. Musculoskeletal: No acute osseous findings. Unchanged sclerotic metastasis of T11 (series 5, image 108). IMPRESSION: 1. Interval enlargement  of bilateral axillary and subpectoral lymph nodes, consistent with worsened nodal metastases. 2. Slight interval enlargement of bilateral adrenal nodules, consistent with enlarging. 3. Hypodense splenic lesions are very poorly appreciated on this noncontrast examination, difficult to compare for interval change. 4. Unchanged prominent subcentimeter retroperitoneal lymph nodes. 5. Unchanged sclerotic metastasis of T11. 6. New small pericardial effusion. 7. Status post right upper lobectomy. Aortic Atherosclerosis (ICD10-I70.0). Electronically Signed   By: Marolyn JONETTA Jaksch M.D.   On: 01/07/2024 21:32     ASSESSMENT/PLAN:  This is a very pleasant 68 year old Caucasian female with stage IV (T1a, N3, M1 C) non-small cell lung cancer.  She was initially diagnosed as a stage Ia non-small cell lung cancer, status post right upper lobectomy with lymph node dissection.  She was diagnosed in March 2018.  She was found to have evidence of disease recurrence and metastasis in December 2024.   When she was found to have metastatic disease, she had several abnormal lymph nodes throughout the chest including the hilar, mediastinal, supraclavicular, bilateral axillary region as well as upper abdominal lymphadenopathy.  There was also an increasing right adrenal nodule worrisome for adrenal metastasis.   She underwent ultrasound-guided biopsy of the axillary lymph node and the final pathology was consistent with metastatic adenocarcinoma of lung primary.  She is positive for K-ras G 12C mutation which can be used in the second line setting.   She also was found to have metastatic disease to the brain and underwent whole brain radiation in 08/16/23   The patient is here today to start the first dose of systemic chemotherapy with carboplatin  for AUC of 5, Alimta 500 Mg/M2 and Keytruda  200 Mg IV every 3 weeks.  First dose August 21, 2023.  Status post 7 cycles.   She was recently found to have evidence of disease  progression.  Therefore, her treatment was discontinued.  She was recently started on Krazati .  She started this on ~6/19.    She reports she is having trouble with Krazati .  She lost approximately 10 pounds and has been having nausea and vomiting daily after taking Krazati  as well as diarrhea.  Discussed with Dr. Sherrod and we will reduce her dose of Krazati  to 400 mg  twice daily instead of 600 mg twice daily.  The patient was encouraged to hydrate well at home.  I also encouraged her to use Glucerna at least 1-2 times per day to try and gain weight back.  Hopefully she will have less side effects with the reduced dose.  I instructed her to take Imodium first thing in the morning to see if it keeps her diarrhea at bay.  I also reminded her of the dosing of Imodium that she can take an additional 2 mg of Imodium after each loose stool as long as she is not exceeding the max dose of 16 mg/day.  I also advised her to take either Compazine  or Zofran  approximately an hour or so prior to taking her Krazati  to see if it helps with the nausea and vomiting.  Will see her back for labs and a follow-up in 2 weeks.  Of course I did remind the patient that should she have any trouble with the reduced dose to call us  back sooner for recommendations.  The patient continues to have stable anemia.  Her hemoglobin is 8.0.  I will add a standing order for sample of blood bank with her next lab draw.  We will consider her for a blood transfusion if her hemoglobin were less than 8.    The patient was advised to call immediately if she has any concerning symptoms in the interval. The patient voices understanding of current disease status and treatment options and is in agreement with the current care plan. All questions were answered. The patient knows to call the clinic with any problems, questions or concerns. We can certainly see the patient much sooner if necessary        No orders of the defined types were  placed in this encounter.   The total time spent in the appointment was 20-29 minutes  Chapin Arduini L Davin Archuletta, PA-C 01/16/24

## 2024-01-18 ENCOUNTER — Other Ambulatory Visit: Payer: Self-pay | Admitting: Radiation Oncology

## 2024-01-18 DIAGNOSIS — C7931 Secondary malignant neoplasm of brain: Secondary | ICD-10-CM

## 2024-01-21 ENCOUNTER — Other Ambulatory Visit: Payer: Self-pay | Admitting: Physician Assistant

## 2024-01-21 DIAGNOSIS — C3491 Malignant neoplasm of unspecified part of right bronchus or lung: Secondary | ICD-10-CM

## 2024-01-22 ENCOUNTER — Ambulatory Visit: Admitting: Physician Assistant

## 2024-01-22 ENCOUNTER — Inpatient Hospital Stay (HOSPITAL_BASED_OUTPATIENT_CLINIC_OR_DEPARTMENT_OTHER): Admitting: Physician Assistant

## 2024-01-22 ENCOUNTER — Other Ambulatory Visit

## 2024-01-22 ENCOUNTER — Inpatient Hospital Stay

## 2024-01-22 ENCOUNTER — Ambulatory Visit

## 2024-01-22 ENCOUNTER — Inpatient Hospital Stay: Admitting: Dietician

## 2024-01-22 VITALS — BP 153/67 | HR 60 | Temp 97.7°F | Resp 13 | Wt 155.5 lb

## 2024-01-22 DIAGNOSIS — C3491 Malignant neoplasm of unspecified part of right bronchus or lung: Secondary | ICD-10-CM

## 2024-01-22 DIAGNOSIS — R110 Nausea: Secondary | ICD-10-CM

## 2024-01-22 DIAGNOSIS — Z5112 Encounter for antineoplastic immunotherapy: Secondary | ICD-10-CM | POA: Diagnosis not present

## 2024-01-22 DIAGNOSIS — Z95828 Presence of other vascular implants and grafts: Secondary | ICD-10-CM

## 2024-01-22 LAB — CMP (CANCER CENTER ONLY)
ALT: 8 U/L (ref 0–44)
AST: 22 U/L (ref 15–41)
Albumin: 3.3 g/dL — ABNORMAL LOW (ref 3.5–5.0)
Alkaline Phosphatase: 72 U/L (ref 38–126)
Anion gap: 8 (ref 5–15)
BUN: 11 mg/dL (ref 8–23)
CO2: 26 mmol/L (ref 22–32)
Calcium: 8.9 mg/dL (ref 8.9–10.3)
Chloride: 104 mmol/L (ref 98–111)
Creatinine: 1.74 mg/dL — ABNORMAL HIGH (ref 0.44–1.00)
GFR, Estimated: 31 mL/min — ABNORMAL LOW (ref >60)
Glucose, Bld: 123 mg/dL — ABNORMAL HIGH (ref 70–99)
Potassium: 4.3 mmol/L (ref 3.5–5.1)
Sodium: 138 mmol/L (ref 135–145)
Total Bilirubin: 0.6 mg/dL (ref 0.0–1.2)
Total Protein: 6.3 g/dL — ABNORMAL LOW (ref 6.5–8.1)

## 2024-01-22 LAB — SAMPLE TO BLOOD BANK

## 2024-01-22 LAB — CBC WITH DIFFERENTIAL (CANCER CENTER ONLY)
Abs Immature Granulocytes: 0.02 10*3/uL (ref 0.00–0.07)
Basophils Absolute: 0 10*3/uL (ref 0.0–0.1)
Basophils Relative: 1 %
Eosinophils Absolute: 0.2 10*3/uL (ref 0.0–0.5)
Eosinophils Relative: 4 %
HCT: 22.5 % — ABNORMAL LOW (ref 36.0–46.0)
Hemoglobin: 8 g/dL — ABNORMAL LOW (ref 12.0–15.0)
Immature Granulocytes: 1 %
Lymphocytes Relative: 29 %
Lymphs Abs: 1.2 10*3/uL (ref 0.7–4.0)
MCH: 31.4 pg (ref 26.0–34.0)
MCHC: 35.6 g/dL (ref 30.0–36.0)
MCV: 88.2 fL (ref 80.0–100.0)
Monocytes Absolute: 0.4 10*3/uL (ref 0.1–1.0)
Monocytes Relative: 11 %
Neutro Abs: 2.2 10*3/uL (ref 1.7–7.7)
Neutrophils Relative %: 54 %
Platelet Count: 128 10*3/uL — ABNORMAL LOW (ref 150–400)
RBC: 2.55 MIL/uL — ABNORMAL LOW (ref 3.87–5.11)
RDW: 14.8 % (ref 11.5–15.5)
WBC Count: 4 10*3/uL (ref 4.0–10.5)
nRBC: 0 % (ref 0.0–0.2)

## 2024-01-22 MED ORDER — SODIUM CHLORIDE 0.9% FLUSH
10.0000 mL | Freq: Once | INTRAVENOUS | Status: AC
  Administered 2024-01-22: 10 mL

## 2024-01-22 NOTE — Progress Notes (Signed)
 Scheduled to see pt during infusion for nutrition follow-up. Treatment cancelled today following provider visit. No further treatments scheduled at this time. Will reschedule appointment as able.

## 2024-01-27 ENCOUNTER — Inpatient Hospital Stay: Admitting: Internal Medicine

## 2024-01-27 ENCOUNTER — Inpatient Hospital Stay

## 2024-01-30 ENCOUNTER — Other Ambulatory Visit: Payer: Self-pay

## 2024-02-03 ENCOUNTER — Other Ambulatory Visit (HOSPITAL_COMMUNITY): Payer: Self-pay

## 2024-02-03 ENCOUNTER — Other Ambulatory Visit: Payer: Self-pay

## 2024-02-03 ENCOUNTER — Telehealth: Payer: Self-pay

## 2024-02-03 ENCOUNTER — Inpatient Hospital Stay: Attending: Internal Medicine

## 2024-02-03 ENCOUNTER — Inpatient Hospital Stay (HOSPITAL_BASED_OUTPATIENT_CLINIC_OR_DEPARTMENT_OTHER): Admitting: Physician Assistant

## 2024-02-03 DIAGNOSIS — Z79899 Other long term (current) drug therapy: Secondary | ICD-10-CM | POA: Insufficient documentation

## 2024-02-03 DIAGNOSIS — C7951 Secondary malignant neoplasm of bone: Secondary | ICD-10-CM | POA: Diagnosis not present

## 2024-02-03 DIAGNOSIS — E860 Dehydration: Secondary | ICD-10-CM | POA: Diagnosis not present

## 2024-02-03 DIAGNOSIS — E785 Hyperlipidemia, unspecified: Secondary | ICD-10-CM | POA: Insufficient documentation

## 2024-02-03 DIAGNOSIS — C3491 Malignant neoplasm of unspecified part of right bronchus or lung: Secondary | ICD-10-CM | POA: Diagnosis not present

## 2024-02-03 DIAGNOSIS — I700 Atherosclerosis of aorta: Secondary | ICD-10-CM | POA: Insufficient documentation

## 2024-02-03 DIAGNOSIS — G4700 Insomnia, unspecified: Secondary | ICD-10-CM | POA: Diagnosis not present

## 2024-02-03 DIAGNOSIS — N210 Calculus in bladder: Secondary | ICD-10-CM | POA: Diagnosis not present

## 2024-02-03 DIAGNOSIS — C773 Secondary and unspecified malignant neoplasm of axilla and upper limb lymph nodes: Secondary | ICD-10-CM | POA: Diagnosis not present

## 2024-02-03 DIAGNOSIS — E119 Type 2 diabetes mellitus without complications: Secondary | ICD-10-CM | POA: Diagnosis not present

## 2024-02-03 DIAGNOSIS — Z7984 Long term (current) use of oral hypoglycemic drugs: Secondary | ICD-10-CM | POA: Diagnosis not present

## 2024-02-03 DIAGNOSIS — R112 Nausea with vomiting, unspecified: Secondary | ICD-10-CM | POA: Diagnosis not present

## 2024-02-03 DIAGNOSIS — I10 Essential (primary) hypertension: Secondary | ICD-10-CM | POA: Diagnosis not present

## 2024-02-03 DIAGNOSIS — C7931 Secondary malignant neoplasm of brain: Secondary | ICD-10-CM | POA: Insufficient documentation

## 2024-02-03 DIAGNOSIS — R197 Diarrhea, unspecified: Secondary | ICD-10-CM

## 2024-02-03 DIAGNOSIS — G8929 Other chronic pain: Secondary | ICD-10-CM | POA: Diagnosis not present

## 2024-02-03 DIAGNOSIS — Z9641 Presence of insulin pump (external) (internal): Secondary | ICD-10-CM | POA: Diagnosis not present

## 2024-02-03 DIAGNOSIS — K5730 Diverticulosis of large intestine without perforation or abscess without bleeding: Secondary | ICD-10-CM | POA: Insufficient documentation

## 2024-02-03 DIAGNOSIS — K219 Gastro-esophageal reflux disease without esophagitis: Secondary | ICD-10-CM | POA: Diagnosis not present

## 2024-02-03 DIAGNOSIS — I3139 Other pericardial effusion (noninflammatory): Secondary | ICD-10-CM | POA: Diagnosis not present

## 2024-02-03 DIAGNOSIS — Z794 Long term (current) use of insulin: Secondary | ICD-10-CM | POA: Insufficient documentation

## 2024-02-03 DIAGNOSIS — C3411 Malignant neoplasm of upper lobe, right bronchus or lung: Secondary | ICD-10-CM | POA: Diagnosis not present

## 2024-02-03 LAB — CBC WITH DIFFERENTIAL (CANCER CENTER ONLY)
Abs Immature Granulocytes: 0.04 K/uL (ref 0.00–0.07)
Basophils Absolute: 0 K/uL (ref 0.0–0.1)
Basophils Relative: 1 %
Eosinophils Absolute: 0.1 K/uL (ref 0.0–0.5)
Eosinophils Relative: 2 %
HCT: 24.9 % — ABNORMAL LOW (ref 36.0–46.0)
Hemoglobin: 8.9 g/dL — ABNORMAL LOW (ref 12.0–15.0)
Immature Granulocytes: 1 %
Lymphocytes Relative: 30 %
Lymphs Abs: 1.6 K/uL (ref 0.7–4.0)
MCH: 31.2 pg (ref 26.0–34.0)
MCHC: 35.7 g/dL (ref 30.0–36.0)
MCV: 87.4 fL (ref 80.0–100.0)
Monocytes Absolute: 0.5 K/uL (ref 0.1–1.0)
Monocytes Relative: 9 %
Neutro Abs: 3.1 K/uL (ref 1.7–7.7)
Neutrophils Relative %: 57 %
Platelet Count: 158 K/uL (ref 150–400)
RBC: 2.85 MIL/uL — ABNORMAL LOW (ref 3.87–5.11)
RDW: 13.3 % (ref 11.5–15.5)
WBC Count: 5.4 K/uL (ref 4.0–10.5)
nRBC: 0 % (ref 0.0–0.2)

## 2024-02-03 LAB — SAMPLE TO BLOOD BANK

## 2024-02-03 LAB — CMP (CANCER CENTER ONLY)
ALT: 10 U/L (ref 0–44)
AST: 24 U/L (ref 15–41)
Albumin: 3.3 g/dL — ABNORMAL LOW (ref 3.5–5.0)
Alkaline Phosphatase: 79 U/L (ref 38–126)
Anion gap: 7 (ref 5–15)
BUN: 15 mg/dL (ref 8–23)
CO2: 27 mmol/L (ref 22–32)
Calcium: 9 mg/dL (ref 8.9–10.3)
Chloride: 103 mmol/L (ref 98–111)
Creatinine: 1.91 mg/dL — ABNORMAL HIGH (ref 0.44–1.00)
GFR, Estimated: 28 mL/min — ABNORMAL LOW (ref >60)
Glucose, Bld: 134 mg/dL — ABNORMAL HIGH (ref 70–99)
Potassium: 4 mmol/L (ref 3.5–5.1)
Sodium: 137 mmol/L (ref 135–145)
Total Bilirubin: 0.5 mg/dL (ref 0.0–1.2)
Total Protein: 6.5 g/dL (ref 6.5–8.1)

## 2024-02-03 MED ORDER — DIPHENOXYLATE-ATROPINE 2.5-0.025 MG PO TABS: 2.0000 | ORAL_TABLET | Freq: Four times a day (QID) | ORAL | 0 refills | Status: DC | PRN

## 2024-02-03 NOTE — Progress Notes (Signed)
 [facility] HEMATOLOGY-ONCOLOGY TeleHEALTH VISIT PROGRESS NOTE   I connected with [name] on 02/03/24 at  1:30 PM EDT by telephone and verified that I am speaking with the correct person using two identifiers.  I discussed the limitations, risks, security and privacy concerns of performing an evaluation and management service by telemedicine and the availability of in-person appointments. I also discussed with the patient that there may be a patient responsible charge related to this service. The patient expressed understanding and agreed to proceed.  Other persons participating in the visit and their role in the encounter: None   Patient's location: Home  Provider's location: Office  Koirala, Dibas, MD [address] [place] [address]  DIAGNOSIS: 1) recurrent/metastatic non-small cell lung cancer, adenocarcinoma presented with metastatic adenopathy in the thorax as well as mediastinum, axillary lymph nodes and potential local recurrence at the right hilum along the inferior margin of the resection staple line with bilateral hypermetabolic adrenal metastasis and hypermetabolic bone metastasis to the T11 vertebral body as well as brain metastasis diagnosed in December 2024. 2) incidental bilaterally pulmonary embolism diagnosed on CT scan of the chest on October 22, 2023 2) Stage IA (T1a, N0, M0) non-small cell lung cancer, adenocarcinoma presented with right upper lobe pulmonary nodule    Detected Alteration(s) / Biomarker(s)Associated FDA-approved therapiesClinical Trial Availability% cfDNA or Amplification KRAS G12C approved by FDA Adagrasib , Sotorasib approved in other indication Adagrasib +cetuximab Yes2.8%   TP53 E204* None Yes2.0%  PRIOR THERAPY: 1) S/P right VATS with right upper lobectomy and mediastinal lymph node dissection under the care of Dr. Kerrin on October 15, 2016. 2) whole brain irradiation under the care of of Dr. Izell. 3)  Systemic chemotherapy with carboplatin  for AUC of 5, Alimta  500 Mg/M2 and Keytruda  200 Mg IV every 3 weeks.  First dose August 20, 2022.  Status post 7   cycles.  Starting from cycle #5 she has been on maintenance treatment with Alimta  and Keytruda  every 3 weeks for 3 more cycles.  Last dose was given on December 29, 2023 discontinued secondary to disease progression  CURRENT THERAPY:  1) adagrasib  600 mg p.o. twice daily.  First dose on ~01/16/24. Her dose will be reduced to 400 mg p.o. BID starting from 01/23/24 2) Eliquis  starter kit with 10 mg p.o. twice daily for 7 days followed by 5 mg p.o. daily.  First dose started October 18, 2023.  INTERVAL HISTORY: [name] 68 y.o. female and I connected via telephone call today.  Patient was last seen in the clinic on 01/22/2024.  In summary the patient was recently found to have disease progression she was started on oral treatment with Krazati .  At her last appointment she had some intolerance with nausea, vomiting, and diarrhea.  Her dose was reduced to 400 mg twice daily.  Her nausea and vomiting seems to be better.  Her diarrhea is also improved but she did have about 2 bouts of diarrhea since last being seen.  She tries to hydrate well with water  but her kidney function is slightly elevated today.  She is going out of town for her 68th birthday party this weekend and she is wondering if it is okay to hold her Krazati  for few days.  She is also wanting clarification on how to take her Imodium.  She is only been taking 1 to 2/day.  She denies any fever, chills, or night sweats.  She had lost weight at her last appointment.  The patient has not weighed herself to know she has gained her weight back.  She sometimes gets shortness of breath/sluggish.  She does struggle with anemia.  She denies any chest pain or unusual cough.  She is taking an iron  supplement.  She reports her appetite is okay but sometimes her stomach gets a little fussy but she tries to eat  things that she knows she can tolerate.  She is here today for evaluation and repeat blood work.     MEDICAL HISTORY: Past Medical History:  Diagnosis Date   Adenocarcinoma of right lung, stage 1 (HCC) 11/29/2016   Chronic pain    Depression    takes Lexapro  daily   Diabetes mellitus    takes Metformin  daily and has an insulin  pump.Fasting blood sugar runs250   GERD (gastroesophageal reflux disease)    takes Pantoprazole  daily   History of colon polyps    benign   Hyperlipidemia    takes Atorvastatin  daily   Hypertension    takes Amlodipine  and Lisinopril  daily   Insomnia    takes Trazodone  nightly   Nocturia    recurrent lung ca with mets to brain, LN, bone and adrenal gland 06/2023   Thyroid  disease    Urinary frequency    Urinary urgency     ALLERGIES:  is allergic to synthroid [levothyroxine sodium], voltaren  [diclofenac  sodium], hydrochlorothiazide, and iodine.  MEDICATIONS:  Current Outpatient Medications  Medication Sig Dispense Refill   ACCU-CHEK AVIVA PLUS test strip USE TO MONITOR GLUCOSE LEVELS TWICE A DAY E11.9 100 strip 12   ACCU-CHEK SOFTCLIX LANCETS lancets Use to monitor glucose levels BID; E11.9 (Patient taking differently: 1 each by Other route 2 (two) times daily. E11.9) 100 each 12   acetaminophen  (TYLENOL ) 500 MG tablet Take 1 tablet (500 mg total) by mouth every 6 (six) hours as needed. (Patient taking differently: Take 500 mg by mouth every 6 (six) hours as needed (for pain).) 30 tablet 0   adagrasib  (KRAZATI ) 200 MG tablet Take 3 tablets (600 mg total) by mouth 2 (two) times daily. 180 tablet 3   atorvastatin  (LIPITOR) 20 MG tablet Take 20 mg by mouth daily.  11   Blood Glucose Calibration (GLUCOSE CONTROL) SOLN 1 Bottle by In Vitro route as needed. Use to calibrate Accu-Chek Aviva Plus device prn; please provide control solution compatible with Accu-Chek Aviva Plus device. (Patient taking differently: 1 each by Other route as needed (Use to calibrate  glucometer). E11.9) 1 each 1   Blood Glucose Monitoring Suppl (ACCU-CHEK AVIVA PLUS) w/Device KIT 1 each by Does not apply route 2 (two) times daily. Use to monitor glucose levels BID; E11.9 (Patient taking differently: 1 each by Does not apply route 2 (two) times daily. E11.9) 1 kit 0   clotrimazole  (MYCELEX ) 10 MG troche Take 10 mg by mouth 5 (five) times daily.     diphenoxylate -atropine  (LOMOTIL ) 2.5-0.025 MG tablet Take 2 tablets by mouth 4 (four) times daily as needed for diarrhea or loose stools. 30 tablet 0   escitalopram  (LEXAPRO ) 10 MG tablet Take 1 tablet (10 mg total) by mouth every morning. 90 tablet 3   ferrous sulfate  325 (65 FE) MG tablet Take 1 tablet (325 mg total) by mouth 2 (two) times daily with a meal. 60 tablet 0   insulin  glargine (LANTUS  SOLOSTAR) 100 UNIT/ML Solostar Pen Inject 15 Units into the skin at bedtime. (Patient taking differently: Inject 15 Units into the skin at bedtime as needed (high bs).) 15 mL  3   Insulin  Pen Needle (B-D ULTRAFINE III SHORT PEN) 31G X 8 MM MISC USE AS DIRECTED 100 each 5   lidocaine  (XYLOCAINE ) 2 % solution every 6 (six) hours as needed for mouth pain. Apply topically to mouth for sores     magic mouthwash SOLN Take 5 mLs by mouth 3 (three) times daily as needed for mouth pain. Suspension contains equal amounts of Maalox Extra Strength, nystatin, and diphenhydramine .     memantine  (NAMENDA ) 10 MG tablet Take 1 tablet (10 mg total) by mouth 2 (two) times daily. Take through 01-20-24, then stop. 180 tablet 0   pantoprazole  (PROTONIX ) 40 MG tablet TAKE 1 TABLET BY MOUTH TWICE A DAY 30 MINS BEFORE YOUR FIRST AND LAST MEAL. 180 tablet 1   traZODone  (DESYREL ) 100 MG tablet Take 2 tablets (200 mg total) by mouth at bedtime. 180 tablet 3   No current facility-administered medications for this visit.    SURGICAL HISTORY:  Past Surgical History:  Procedure Laterality Date   ABDOMINAL HYSTERECTOMY     ABDOMINAL SURGERY     BIOPSY  05/28/2019    Procedure: BIOPSY;  Surgeon: Harvey Margo CROME, MD;  Location: [facility];  Service: Endoscopy;;  random colon/gastric/duodenum   BIOPSY  01/11/2023   Procedure: BIOPSY;  Surgeon: Cindie Carlin POUR, DO;  Location: [facility];  Service: Endoscopy;;   COLONOSCOPY  2005 MAC   TICs, IH   COLONOSCOPY N/A 03/08/2014   Procedure: COLONOSCOPY;  Surgeon: Margo CROME Harvey, MD;  [facility]: [facility];  Service: Endoscopy;  Laterality: N/A;  1015   COLONOSCOPY WITH PROPOFOL  N/A 05/28/2019   Procedure: COLONOSCOPY WITH PROPOFOL ;  Surgeon: Harvey Margo CROME, MD;  [facility]: [facility];  Service: Endoscopy;  Laterality: N/A;  9:15am   CYSTOSTOMY  11/22/2011   Procedure: CYSTOSTOMY SUPRAPUBIC;  Surgeon: Emery LILLETTE Blaze, MD;  Location: [facility];  Service: Urology;  Laterality: N/A;   ESOPHAGOGASTRODUODENOSCOPY N/A 03/08/2014   Procedure: ESOPHAGOGASTRODUODENOSCOPY (EGD);  Surgeon: Margo CROME Harvey, MD;  [facility];  Service: Endoscopy;  Laterality: N/A;   ESOPHAGOGASTRODUODENOSCOPY     ESOPHAGOGASTRODUODENOSCOPY (EGD) WITH PROPOFOL  N/A 05/28/2019   Procedure: ESOPHAGOGASTRODUODENOSCOPY (EGD) WITH PROPOFOL ;  Surgeon: Harvey Margo CROME, MD;  [facility];  Service: Endoscopy;  Laterality: N/A;   ESOPHAGOGASTRODUODENOSCOPY (EGD) WITH PROPOFOL  N/A 01/11/2023   Procedure: ESOPHAGOGASTRODUODENOSCOPY (EGD) WITH PROPOFOL ;  Surgeon: Cindie Carlin POUR, DO;  [facility];  Service: Endoscopy;  Laterality: N/A;  9:45 am, asa 3   GIVENS CAPSULE STUDY N/A 06/15/2019   Procedure: GIVENS CAPSULE STUDY;  Surgeon: Harvey Margo CROME, MD;  [facility];  Service: Endoscopy;  Laterality: N/A;  7:30am   HALLUX VALGUS CORRECTION     bilateral foot surgery for bone repairs-multiple   IR IMAGING GUIDED PORT INSERTION  08/19/2023   LOBECTOMY Right 10/15/2016   Procedure: RIGHT UPPER LOBECTOMY;  Surgeon: Elspeth JAYSON Millers, MD;  Location: [facility] OR;  Service: Thoracic;  Laterality: Right;   LYMPH  NODE DISSECTION Right 10/15/2016   Procedure: LYMPH NODE DISSECTION;  Surgeon: Elspeth JAYSON Millers, MD;  [facility];  Service: Thoracic;  Laterality: Right;   POLYPECTOMY  05/28/2019   Procedure: POLYPECTOMY;  Surgeon: Harvey Margo CROME, MD;  [facility];  Service: Endoscopy;;   SAVORY DILATION N/A 05/28/2019   Procedure: SAVORY DILATION;  Surgeon: Harvey Margo CROME, MD;  [facility];  Service: Endoscopy;  Laterality: N/A;  15/16/17  VAGINA RECONSTRUCTION SURGERY     tvt 2006- [place]    VIDEO ASSISTED THORACOSCOPY (VATS)/WEDGE RESECTION Right 10/15/2016   Procedure: RIGHT VIDEO ASSISTED THORACOSCOPY (VATS)/WEDGE RESECTION;  Surgeon: Elspeth JAYSON Millers, MD;  Location: [facility] OR;  Service: Thoracic;  Laterality: Right;    REVIEW OF SYSTEMS:   Review of Systems  Constitutional: Positive for fatigue. Negative for appetite change, chills, fatigue, fever and unexpected weight change.  HENT: Negative for mouth sores, nosebleeds, sore throat and trouble swallowing.   Eyes: Negative for eye problems and icterus.  Respiratory: Negative for cough, hemoptysis, shortness of breath and wheezing.   Cardiovascular: Negative for chest pain and leg swelling.  Gastrointestinal: Positive for improved but intermittent diarrhea. Improved nausea/vomiting. Negative for abdominal pain or constipation. Genitourinary: Negative for bladder incontinence, difficulty urinating, dysuria, frequency and hematuria.   Musculoskeletal: Negative for back pain, gait problem, neck pain and neck stiffness.  Skin: Negative for itching and rash.  Neurological: Negative for dizziness, extremity weakness, gait problem, headaches, light-headedness and seizures.  Hematological: Negative for adenopathy. Does not bleed easily. Positive for easy bruising.  Psychiatric/Behavioral: Negative for confusion, depression and sleep disturbance. The patient is not nervous/anxious.     PHYSICAL EXAMINATION:  There  were no vitals taken for this visit.  ECOG PERFORMANCE STATUS: 2  Physical Exam  Constitutional: Oriented to person, place, and timePsychiatric: Mood, memory and judgment normal.  Vitals reviewed.  LABORATORY DATA: Lab Results  Component Value Date   WBC 5.4 02/03/2024   HGB 8.9 (L) 02/03/2024   HCT 24.9 (L) 02/03/2024   MCV 87.4 02/03/2024   PLT 158 02/03/2024      Chemistry      Component Value Date/Time   NA 137 02/03/2024 1035   NA 142 10/29/2022 1158   NA 141 05/29/2017 0921   K 4.0 02/03/2024 1035   K 4.8 05/29/2017 0921   CL 103 02/03/2024 1035   CO2 27 02/03/2024 1035   CO2 28 05/29/2017 0921   BUN 15 02/03/2024 1035   BUN 21 10/29/2022 1158   BUN 13.5 05/29/2017 0921   CREATININE 1.91 (H) 02/03/2024 1035   CREATININE 0.8 05/29/2017 0921      Component Value Date/Time   CALCIUM  9.0 02/03/2024 1035   CALCIUM  9.5 05/29/2017 0921   ALKPHOS 79 02/03/2024 1035   ALKPHOS 104 05/29/2017 0921   AST 24 02/03/2024 1035   AST 17 05/29/2017 0921   ALT 10 02/03/2024 1035   ALT 20 05/29/2017 0921   BILITOT 0.5 02/03/2024 1035   BILITOT 0.40 05/29/2017 0921       RADIOGRAPHIC STUDIES:  CT CHEST ABDOMEN PELVIS WO CONTRAST Result Date: 01/07/2024 CLINICAL DATA:  Metastatic lung cancer restaging, ongoing chemotherapy * Tracking Code: BO * EXAM: CT CHEST, ABDOMEN AND PELVIS WITHOUT CONTRAST TECHNIQUE: Multidetector CT imaging of the chest, abdomen and pelvis was performed following the standard protocol without IV contrast. RADIATION DOSE REDUCTION: This exam was performed according to the departmental dose-optimization program which includes automated exposure control, adjustment of the mA and/or kV according to patient size and/or use of iterative reconstruction technique. COMPARISON:  10/22/2023 FINDINGS: CT CHEST FINDINGS Cardiovascular: Right chest port catheter. Aortic atherosclerosis. Normal heart size. New small pericardial effusion. Mediastinum/Nodes: Interval  enlargement of bilateral axillary and subpectoral lymph nodes, largest index node in the right axilla measuring 3.0 x 2.0 cm, previously 2.3 x 1.5 cm (series 2, image 14). No enlarged mediastinal or hilar lymph nodes. Thyroid  gland, trachea, and esophagus demonstrate no significant  findings. Lungs/Pleura: Status post right upper lobectomy. No pleural effusion or pneumothorax. Musculoskeletal: No chest wall abnormality. No acute osseous findings. CT ABDOMEN PELVIS FINDINGS Hepatobiliary: No solid liver abnormality is seen. No gallstones, gallbladder wall thickening, or biliary dilatation. Pancreas: Unremarkable. No pancreatic ductal dilatation or surrounding inflammatory changes. Spleen: Normal in size. Hypodense splenic lesions are very poorly appreciated on this noncontrast examination, difficult to compare for interval change (series 2, image 62). Adrenals/Urinary Tract: Slight interval enlargement of a right adrenal nodule measuring 2.2 x 1.3 cm, previously 1.8 x 0.9 cm (series 2, image 61). Slight interval enlargement of a left adrenal nodule measuring 2.6 x 2.2 cm, previously 2.0 x 2.0 cm when measured similarly (series 2, image 63). Kidneys are normal, without renal calculi, solid lesion, or hydronephrosis. Unchanged calculus in the bladder measuring 1.2 cm (series 5, image 110). Stomach/Bowel: Stomach is within normal limits. Appendix appears normal. No evidence of bowel wall thickening, distention, or inflammatory changes. Sigmoid diverticulosis. Vascular/Lymphatic: Aortic atherosclerosis. Unchanged prominent subcentimeter retroperitoneal lymph nodes (series 2, image 85). Reproductive: No mass or other abnormality. Other: No abdominal wall hernia or abnormality. No ascites. Musculoskeletal: No acute osseous findings. Unchanged sclerotic metastasis of T11 (series 5, image 108). IMPRESSION: 1. Interval enlargement of bilateral axillary and subpectoral lymph nodes, consistent with worsened nodal metastases. 2.  Slight interval enlargement of bilateral adrenal nodules, consistent with enlarging. 3. Hypodense splenic lesions are very poorly appreciated on this noncontrast examination, difficult to compare for interval change. 4. Unchanged prominent subcentimeter retroperitoneal lymph nodes. 5. Unchanged sclerotic metastasis of T11. 6. New small pericardial effusion. 7. Status post right upper lobectomy. Aortic Atherosclerosis (ICD10-I70.0). Electronically Signed   By: Marolyn JONETTA Jaksch M.D.   On: 01/07/2024 21:32     ASSESSMENT/PLAN:  This is a very pleasant 68 year old Caucasian female with stage IV (T1a, N3, M1 C) non-small cell lung cancer.  She was initially diagnosed as a stage Ia non-small cell lung cancer, status post right upper lobectomy with lymph node dissection.  She was diagnosed in March 2018.  She was found to have evidence of disease recurrence and metastasis in December 2024.   When she was found to have metastatic disease, she had several abnormal lymph nodes throughout the chest including the hilar, mediastinal, supraclavicular, bilateral axillary region as well as upper abdominal lymphadenopathy.  There was also an increasing right adrenal nodule worrisome for adrenal metastasis.   She underwent ultrasound-guided biopsy of the axillary lymph node and the final pathology was consistent with metastatic adenocarcinoma of lung primary.  She is positive for K-ras G 12C mutation which can be used in the second line setting.   She also was found to have metastatic disease to the brain and underwent whole brain radiation in 08/16/23   The patient is here today to start the first dose of systemic chemotherapy with carboplatin  for AUC of 5, Alimta  500 Mg/M2 and Keytruda  200 Mg IV every 3 weeks.  First dose August 21, 2023.  Status post 7 cycles.    She was recently found to have evidence of disease progression.  Therefore, her treatment was discontinued.   She was recently started on Krazati .  She  started this on ~6/19.     She reports she is having trouble with Krazati .  She lost approximately 10 pounds and has been having nausea and vomiting daily after taking Krazati  as well as diarrhea.   At her last appointment, I discussed with Dr. Sherrod and we will reduce her dose of  Krazati  to 400 mg twice daily instead of 600 mg twice daily.   The patient was encouraged to hydrate well at home.  I also encouraged her to use Glucerna at least 1-2 times per day to try and gain weight back.    I instructed her to take Imodium first thing in the morning to see if it keeps her diarrhea at bay. Her diarrhea does seem improved on the lower dose. I also reminded her of the dosing of Imodium that she can take an additional 2 mg of Imodium after each loose stool as long as she is not exceeding the max dose of 16 mg/day.  I also sent her Lomotil  as a backup to alternate with Imodium if needed for better control of her diarrhea. She will continue to hydrate well at home.   The patient is going out of town for a few days.  I let her know it is okay to hold her medication for a few days for her vacation due to concerns with having diarrhea during air travel.   Will see her back for labs and a follow-up in 2 weeks.  Of course I did remind the patient that should she have any trouble with the reduced dose to call us  back sooner for recommendations.   The patient continues to have stable anemia.  Her hemoglobin is 8.0.  I will add a standing order for sample of blood bank with her next lab draw.  We will consider her for a blood transfusion if her hemoglobin were less than 8.   Creatinine is elevated today which is likely secondary to her diarrhea.  Will arrange for 1 L of IV fluids to be administered this week.  I discussed the assessment and treatment plan with the patient. The patient was provided an opportunity to ask questions and all were answered. The patient agreed with the plan and demonstrated an  understanding of the instructions.  The patient was advised to call back or seek an in-person evaluation if the symptoms worsen or if the condition fails to improve as anticipated.  I provided 20-29 minutes of non face-to-face telephone visit time during this encounter, and > 50% was spent counseling as documented under my assessment & plan.  Winfield Caba L Rosielee Corporan, PA-C 02/03/2024 1:32 PM  No orders of the defined types were placed in this encounter.    Alisha Burgo L Tenia Goh, PA-C 02/03/24

## 2024-02-03 NOTE — Progress Notes (Signed)
 Patient presented to the cancer center lobby today. Spoke with patient regarding upcoming appointments scheduled for Thursday, 02/06/24. Patient requested to move the port flush and lab visit to today; appointment was made for 10:45 AM. Also rescheduled the office visit to a telephone appointment for today at 1:30 PM.  Patient reports experiencing some diarrhea, which she believes is related to the cancer medication adagrasib . She has been using Imodium with slight relief. Cassie, PA, approved a prescription for Lomotil  to be sent to the pharmacy. Patient also inquired if it would be okay to stop adagrasib  over the weekend if the diarrhea does not improve by Friday, due to upcoming travel. Cassie, PA, advised that it would be acceptable to hold the medication over the weekend and resume on Monday. Patient voiced understanding of the plan.

## 2024-02-03 NOTE — Telephone Encounter (Signed)
 Spoke with patient regarding lab results. Per Cassie, PA, patient could benefit from IV fluids this week. Offered patient an appointment for 02/05/24 at 07:30 AM, which the patient accepted.

## 2024-02-05 ENCOUNTER — Inpatient Hospital Stay

## 2024-02-05 ENCOUNTER — Encounter (INDEPENDENT_AMBULATORY_CARE_PROVIDER_SITE_OTHER): Payer: Self-pay

## 2024-02-05 ENCOUNTER — Other Ambulatory Visit: Payer: Self-pay | Admitting: Pharmacy Technician

## 2024-02-05 ENCOUNTER — Other Ambulatory Visit: Payer: Self-pay

## 2024-02-05 DIAGNOSIS — C7931 Secondary malignant neoplasm of brain: Secondary | ICD-10-CM | POA: Diagnosis not present

## 2024-02-05 DIAGNOSIS — E860 Dehydration: Secondary | ICD-10-CM

## 2024-02-05 MED ORDER — SODIUM CHLORIDE 0.9 % IV SOLN: Freq: Once | INTRAVENOUS | Status: AC

## 2024-02-05 NOTE — Patient Instructions (Signed)

## 2024-02-05 NOTE — Progress Notes (Signed)
 Specialty Pharmacy Refill Coordination Note  [name] is a 68 y.o. female contacted today regarding refills of specialty medication(s) Adagrasib  (KRAZATI )   Patient requested (Patient-Rptd) Delivery   Delivery date: 02/11/2024 Verified address: (Patient-Rptd) [address] [place] [address]   Medication will be filled on 02/10/2024.

## 2024-02-06 ENCOUNTER — Inpatient Hospital Stay

## 2024-02-06 ENCOUNTER — Inpatient Hospital Stay: Admitting: Internal Medicine

## 2024-02-07 ENCOUNTER — Telehealth: Payer: Self-pay | Admitting: Internal Medicine

## 2024-02-07 NOTE — Telephone Encounter (Signed)
Scheduled appointments with the patient

## 2024-02-10 ENCOUNTER — Other Ambulatory Visit: Payer: Self-pay

## 2024-02-10 ENCOUNTER — Other Ambulatory Visit (HOSPITAL_COMMUNITY): Payer: Self-pay

## 2024-02-10 NOTE — Progress Notes (Signed)
 Specialty Pharmacy Ongoing Clinical Assessment Note  [name] is a 68 y.o. female who is being followed by the specialty pharmacy service for RxSp Oncology   Patient's specialty medication(s) reviewed today: Adagrasib  (KRAZATI )   Missed doses in the last 4 weeks: 5   Patient/Caregiver did not have any additional questions or concerns.   Therapeutic benefit summary: Unable to assess   Adverse events/side effects summary: Experienced adverse events/side effects (diarrhea, better since decreasing dose, but still an issue even with imodium.)   Patient's therapy is appropriate to: Continue    Goals Addressed             This Visit's Progress    Slow Disease Progression       Patient is unable to be assessed as therapy was recently initiated. Patient will maintain adherence         Follow up: 3 months  [facility] Specialty Pharmacist

## 2024-02-12 ENCOUNTER — Inpatient Hospital Stay

## 2024-02-12 ENCOUNTER — Inpatient Hospital Stay: Admitting: Internal Medicine

## 2024-02-12 ENCOUNTER — Ambulatory Visit (HOSPITAL_COMMUNITY)

## 2024-02-13 ENCOUNTER — Other Ambulatory Visit

## 2024-02-13 ENCOUNTER — Ambulatory Visit: Admitting: Internal Medicine

## 2024-02-13 ENCOUNTER — Ambulatory Visit

## 2024-02-18 ENCOUNTER — Other Ambulatory Visit: Payer: Self-pay

## 2024-02-18 DIAGNOSIS — C3491 Malignant neoplasm of unspecified part of right bronchus or lung: Secondary | ICD-10-CM

## 2024-02-18 NOTE — Progress Notes (Signed)
 Ms.  [name] presents today in the clinic for a follow up. She completed radiation therapy for Secondary Malignant Neoplasm of Brain on 08/16/2023.   Recent neurologic symptoms, if any:  Seizures: {:18581} Headaches: {:18581} Nausea: {:18581} Dizziness/ataxia: {:18581} Difficulty with hand coordination: {:18581} Focal numbness/weakness: {:18581} Visual deficits/changes: {:18581} Confusion/Memory deficits: {:18581}  Other issues of note:

## 2024-02-19 ENCOUNTER — Telehealth: Payer: Self-pay | Admitting: Radiation Oncology

## 2024-02-19 ENCOUNTER — Ambulatory Visit
Admission: RE | Admit: 2024-02-19 | Discharge: 2024-02-19 | Disposition: A | Discharge status: Home / Self Care | Payer: Self-pay | Source: Ambulatory Visit | Attending: Radiation Oncology | Admitting: Radiation Oncology

## 2024-02-19 ENCOUNTER — Inpatient Hospital Stay

## 2024-02-19 VITALS — BP 138/81 | HR 59 | Temp 97.5°F | Resp 20 | Ht 67.0 in | Wt 145.6 lb

## 2024-02-19 DIAGNOSIS — Z79899 Other long term (current) drug therapy: Secondary | ICD-10-CM | POA: Diagnosis not present

## 2024-02-19 DIAGNOSIS — Z794 Long term (current) use of insulin: Secondary | ICD-10-CM | POA: Insufficient documentation

## 2024-02-19 DIAGNOSIS — C3411 Malignant neoplasm of upper lobe, right bronchus or lung: Secondary | ICD-10-CM | POA: Diagnosis not present

## 2024-02-19 DIAGNOSIS — C3491 Malignant neoplasm of unspecified part of right bronchus or lung: Secondary | ICD-10-CM

## 2024-02-19 DIAGNOSIS — Z923 Personal history of irradiation: Secondary | ICD-10-CM | POA: Diagnosis not present

## 2024-02-19 DIAGNOSIS — C7931 Secondary malignant neoplasm of brain: Secondary | ICD-10-CM | POA: Insufficient documentation

## 2024-02-19 DIAGNOSIS — Z95828 Presence of other vascular implants and grafts: Secondary | ICD-10-CM

## 2024-02-19 DIAGNOSIS — E860 Dehydration: Secondary | ICD-10-CM

## 2024-02-19 LAB — CMP (CANCER CENTER ONLY)
ALT: 10 U/L (ref 0–44)
AST: 30 U/L (ref 15–41)
Albumin: 3.1 g/dL — ABNORMAL LOW (ref 3.5–5.0)
Alkaline Phosphatase: 94 U/L (ref 38–126)
Anion gap: 6 (ref 5–15)
BUN: 12 mg/dL (ref 8–23)
CO2: 29 mmol/L (ref 22–32)
Calcium: 8.6 mg/dL — ABNORMAL LOW (ref 8.9–10.3)
Chloride: 101 mmol/L (ref 98–111)
Creatinine: 1.66 mg/dL — ABNORMAL HIGH (ref 0.44–1.00)
GFR, Estimated: 33 mL/min — ABNORMAL LOW (ref >60)
Glucose, Bld: 109 mg/dL — ABNORMAL HIGH (ref 70–99)
Potassium: 4 mmol/L (ref 3.5–5.1)
Sodium: 136 mmol/L (ref 135–145)
Total Bilirubin: 0.5 mg/dL (ref 0.0–1.2)
Total Protein: 6.1 g/dL — ABNORMAL LOW (ref 6.5–8.1)

## 2024-02-19 LAB — CBC WITH DIFFERENTIAL (CANCER CENTER ONLY)
Abs Immature Granulocytes: 0.03 K/uL (ref 0.00–0.07)
Basophils Absolute: 0 K/uL (ref 0.0–0.1)
Basophils Relative: 1 %
Eosinophils Absolute: 0.1 K/uL (ref 0.0–0.5)
Eosinophils Relative: 1 %
HCT: 25.5 % — ABNORMAL LOW (ref 36.0–46.0)
Hemoglobin: 9 g/dL — ABNORMAL LOW (ref 12.0–15.0)
Immature Granulocytes: 1 %
Lymphocytes Relative: 28 %
Lymphs Abs: 1.1 K/uL (ref 0.7–4.0)
MCH: 30.6 pg (ref 26.0–34.0)
MCHC: 35.3 g/dL (ref 30.0–36.0)
MCV: 86.7 fL (ref 80.0–100.0)
Monocytes Absolute: 0.6 K/uL (ref 0.1–1.0)
Monocytes Relative: 15 %
Neutro Abs: 2.2 K/uL (ref 1.7–7.7)
Neutrophils Relative %: 54 %
Platelet Count: 105 K/uL — ABNORMAL LOW (ref 150–400)
RBC: 2.94 MIL/uL — ABNORMAL LOW (ref 3.87–5.11)
RDW: 13.1 % (ref 11.5–15.5)
Smear Review: NORMAL
WBC Count: 4 K/uL (ref 4.0–10.5)
nRBC: 0 % (ref 0.0–0.2)

## 2024-02-19 LAB — SAMPLE TO BLOOD BANK

## 2024-02-19 LAB — RESEARCH LABS

## 2024-02-19 MED ORDER — HEPARIN SOD (PORK) LOCK FLUSH 100 UNIT/ML IV SOLN
500.0000 [IU] | Freq: Once | INTRAVENOUS | Status: AC
  Administered 2024-02-19: 500 [IU]

## 2024-02-19 MED ORDER — SODIUM CHLORIDE 0.9% FLUSH
10.0000 mL | Freq: Once | INTRAVENOUS | Status: AC
  Administered 2024-02-19: 10 mL

## 2024-02-19 NOTE — Progress Notes (Signed)
 Radiation Oncology         [phone] ________________________________  Name: [name] MRN: [id number]  Date: 02/19/2024  DOB: 1956-03-16  Follow-Up Visit Note  Outpatient  CC: Regino Slater, MD  Regino Slater, MD  Diagnosis and Prior Radiotherapy:    ICD-10-CM   1. Metastasis to brain [facility])  C79.31         ==========DELIVERED PLANS==========  First Treatment Date: 2023-08-05 Last Treatment Date: 2023-08-16   Plan Name: Brain_HA Site: Brain Technique: IMRT Mode: Photon Dose Per Fraction: 3 Gy Prescribed Dose (Delivered / Prescribed): 30 Gy / 30 Gy Prescribed Fxs (Delivered / Prescribed): 10 / 10  Brain metastases from Stage IV NSCLC; s/p WBRT with hippocampal sparing completed on 08/16/2023  CHIEF COMPLAINT: Here for follow-up and surveillance of brain cancer and to review the results of her most recent brain MRI.   Narrative:    [name] is a 68 year old female who presents for follow-up in radiation oncology after hippocampal sparing whole brain radiation.  She experiences intermittent word finding difficulties. Memantine  was previously discontinued and has not been resumed per CE.7 clinical trial protocol. Occasional nausea occurs, attributed to systemic medication prescribed by medical oncology, but there are no headaches, new numbness, or unilateral weakness. She feels off balance at times but does not use a cane or walker. Her eyesight has improved with new glasses. She lives alone and has supportive neighbors who are currently on a cruise. No new dizziness beyond the off-balance sensation is reported.  Left handed.  She did not have a brain MRI since her previous due to insurance issues    ALLERGIES:  is allergic to synthroid [levothyroxine sodium], voltaren  [diclofenac  sodium], hydrochlorothiazide, and iodine.  Meds: Current Outpatient Medications  Medication Sig Dispense Refill   ACCU-CHEK AVIVA PLUS test strip USE TO MONITOR GLUCOSE  LEVELS TWICE A DAY E11.9 100 strip 12   ACCU-CHEK SOFTCLIX LANCETS lancets Use to monitor glucose levels BID; E11.9 (Patient taking differently: 1 each by Other route 2 (two) times daily. E11.9) 100 each 12   acetaminophen  (TYLENOL ) 500 MG tablet Take 1 tablet (500 mg total) by mouth every 6 (six) hours as needed. (Patient taking differently: Take 500 mg by mouth every 6 (six) hours as needed (for pain).) 30 tablet 0   adagrasib  (KRAZATI ) 200 MG tablet Take 3 tablets (600 mg total) by mouth 2 (two) times daily. 180 tablet 3   atorvastatin  (LIPITOR) 20 MG tablet Take 20 mg by mouth daily.  11   Blood Glucose Calibration (GLUCOSE CONTROL) SOLN 1 Bottle by In Vitro route as needed. Use to calibrate Accu-Chek Aviva Plus device prn; please provide control solution compatible with Accu-Chek Aviva Plus device. (Patient taking differently: 1 each by Other route as needed (Use to calibrate glucometer). E11.9) 1 each 1   Blood Glucose Monitoring Suppl (ACCU-CHEK AVIVA PLUS) w/Device KIT 1 each by Does not apply route 2 (two) times daily. Use to monitor glucose levels BID; E11.9 (Patient taking differently: 1 each by Does not apply route 2 (two) times daily. E11.9) 1 kit 0   clotrimazole  (MYCELEX ) 10 MG troche Take 10 mg by mouth 5 (five) times daily.     escitalopram  (LEXAPRO ) 10 MG tablet Take 1 tablet (10 mg total) by mouth every morning. 90 tablet 3   ferrous sulfate  325 (65 FE) MG tablet Take 1 tablet (325 mg total) by mouth 2 (two) times daily with a meal. 60 tablet 0   insulin   glargine (LANTUS  SOLOSTAR) 100 UNIT/ML Solostar Pen Inject 15 Units into the skin at bedtime. (Patient taking differently: Inject 15 Units into the skin at bedtime as needed (high bs).) 15 mL 3   Insulin  Pen Needle (B-D ULTRAFINE III SHORT PEN) 31G X 8 MM MISC USE AS DIRECTED 100 each 5   lidocaine  (XYLOCAINE ) 2 % solution every 6 (six) hours as needed for mouth pain. Apply topically to mouth for sores     magic mouthwash SOLN Take 5  mLs by mouth 3 (three) times daily as needed for mouth pain. Suspension contains equal amounts of Maalox Extra Strength, nystatin, and diphenhydramine .     memantine  (NAMENDA ) 10 MG tablet Take 1 tablet (10 mg total) by mouth 2 (two) times daily. Take through 01-20-24, then stop. 180 tablet 0   pantoprazole  (PROTONIX ) 40 MG tablet TAKE 1 TABLET BY MOUTH TWICE A DAY 30 MINS BEFORE YOUR FIRST AND LAST MEAL. 180 tablet 1   traZODone  (DESYREL ) 100 MG tablet Take 2 tablets (200 mg total) by mouth at bedtime. 180 tablet 3   No current facility-administered medications for this encounter.    Physical Findings:   The patient is in no acute distress. Patient is alert and oriented.  height is 5' 7 (1.702 m) and weight is 145 lb 9.6 oz (66 kg). Her temperature is 97.5 F (36.4 C) (abnormal). Her blood pressure is 138/81 and her pulse is 59 (abnormal). Her respiration is 20 and oxygen saturation is 100%. .    General: Alert and oriented, in no acute distress Neck without masses HEENT: Alopecia.  There is a protuberance over the right skull with the patient reports is chronic from childhood Extraocular movements are intact. Oropharynx is clear.  Mucous membranes are moist Heart: Regular in rate and rhythm with no murmurs, rubs, or gallops. Chest: Good airflow bilaterally with no rhonchi wheezes or rails Abdomen: Soft, nontender, nondistended, with no rigidity or guarding. Extremities: No cyanosis or edema.  Skin:   Warm and well-perfused Musculoskeletal: symmetric strength and muscle tone throughout. Neurologic: Cranial nerves II through XII are grossly intact. No obvious focalities. Speech is fluent. Coordination is intact.  Speech is fluent.  She is able to get onto the examination table without assistance Psychiatric: Judgment and insight are intact. Affect is appropriate.  ECOG = 1  0 - Asymptomatic (Fully active, able to carry on all predisease activities without restriction)  1 - Symptomatic but  completely ambulatory (Restricted in physically strenuous activity but ambulatory and able to carry out work of a light or sedentary nature. For example, light housework, office work)  2 - Symptomatic, <50% in bed during the day (Ambulatory and capable of all self care but unable to carry out any work activities. Up and about more than 50% of waking hours)  3 - Symptomatic, >50% in bed, but not bedbound (Capable of only limited self-care, confined to bed or chair 50% or more of waking hours)  4 - Bedbound (Completely disabled. Cannot carry on any self-care. Totally confined to bed or chair)  5 - Death   Raylene MM, Creech RH, Tormey DC, et al. [phone]). Toxicity and response criteria of the [facility]. Am. DOROTHA Bridges. Oncol. 5 (6): 649-55  KPS = 80  100 - Normal; no complaints; no evidence of disease. 90   - Able to carry on normal activity; minor signs or symptoms of disease. 80   - Normal activity with effort; some signs or symptoms of disease.  46   - Cares for self; unable to carry on normal activity or to do active work. 60   - Requires occasional assistance, but is able to care for most of his personal needs. 50   - Requires considerable assistance and frequent medical care. 40   - Disabled; requires special care and assistance. 30   - Severely disabled; hospital admission is indicated although death not imminent. 20   - Very sick; hospital admission necessary; active supportive treatment necessary. 10   - Moribund; fatal processes progressing rapidly. 0     - Dead  Karnofsky DA, Abelmann WH, Craver LS and Burchenal [facility] [phone]) The use of the nitrogen mustards in the palliative treatment of carcinoma: with particular reference to bronchogenic carcinoma Cancer 1 634-56     Lab Findings: Lab Results  Component Value Date   WBC 5.4 02/03/2024   HGB 8.9 (L) 02/03/2024   HCT 24.9 (L) 02/03/2024   MCV 87.4 02/03/2024   PLT 158 02/03/2024    Radiographic  Findings: No results found.   Impression/Plan:  Left-handed woman with h/o  Brain metastases from Stage IV NSCLC; s/p WBRT completed on 08/16/2023 - good disease control, no progression per physical exam today.  MRI brain to be repeated in mid August if insurance allows. F/u w rad onc at that time by telephone.  In person follow-up with radiation oncology in 3 months for physical exam.  Memantine  given through 01-20-24, then stopped per CE.7 protocol.  She will undergo neurocognitive testing today with research team.  On date of service, in total, I spent 30 minutes on this encounter. Patient was seen in person.     _____________________________________   Lauraine Golden, MD

## 2024-02-19 NOTE — Telephone Encounter (Signed)
 LVM to advise pt of 71m f/u at the request of Dr. Izell with c/b number in case of schedule conflict.

## 2024-02-19 NOTE — Addendum Note (Signed)
 Encounter addended by: Mohamed-Medani, Elverna LABOR, RN on: 02/19/2024 4:07 PM  Actions taken: Order list changed, Medication List reviewed

## 2024-02-19 NOTE — Research (Incomplete)
 TRIAL A PHASE III TRIAL OF STEREOTACTIC RADIOSURGERY COMPARED WITH HIPPOCAMPAL-AVOIDANT WHOLE BRAIN RADIOTHERAPY (HA-WBRT) PLUS MEMANTINE  FOR 5 OR MORE BRAIN METASTASES   Patient arrives today Unaccompanied for the 6 month visit.    PROs: Per study protocol, all PROs required for this visit were completed prior to other study activities and completeness has been verified.     LABS: Mandatory labs are collected per consent and study protocol: [name] tolerated well without complaint.   MEDICATION REVIEW: Patient reviews and verifies the current medication list is correct. Requested that Imodium be added to her list after review of AEs.  VITAL SIGNS: Vital signs are collected per study protocol.  MD/PROVIDER VISIT: Patient sees Dr Izell for today's visit.   ADVERSE EVENTS: [name] reports AE as below. Attribution per Dr Izell.  ADVERSE EVENT LOGEVALYSE STROOPE [id number]  02/19/2024 Event Grade Onset Date Resolved Date Attribution Treatment Comments  Fall Grade 1 10/15/23 11/12/23 Unrelated   Syncopal Event 11/12/23  Anorexia Grade 2 10/02/23 02/05/24 Unrelated   Patient met with Registered Dietician in April  Nausea Grade 2 10/02/23 12/11/23 Unrelated   Patient reports resolution of nausea  Nausea Grade 2 01/13/24  Unrelated to study  Occurs when she takes her adagrasib . Has lost 9 pounds between 01/13/24 visit and today  Vomiting  Grade 1 01/13/24  Unrelated to study  Occurs when she takes adagrasib   Alopecia Grade 2 10/02/23 02/19/24 Unrelated   Resolved; hair growing back  Dysgeusia Grade 2 10/02/23 02/05/24 Unrelated   Has been able to eat as long as she is presented with smaller portions  Anemia Grade 1 08/21/23 11/11/23 Unrelated   Worsened; see new log line (pancytopenia)  Pancytopenia Grade 3 11/12/23 ongoing Unrelated See documentation from November 14, 2023 and Dec 04, 2023 Inclusive of Anemia, white blood cells decreased, platelet count decreased, and neutrophil  count decreased  Creatinine Increased Grade 1 10/08/23 ongoing Unrelated      Syncope Grade 3 11/12/23 11/12/23 Unlikely   Denies further syncopal events  Dizziness Grade 1 11/12/23 ongoing Unrelated   Dizziness upon standing; patient rises slowly from seated position.  States this has been ongoing since syncopal episode.  Spine fracture Grade 3 11/12/23 11/14/23 Unrelated   Discharged 11/14/23; no prescription pain medication prescribed at discharge.  Spine fracture Grade 1 11/15/23  Ongoing Unrelated OTC Tylenol  PRN    Back Pain Grade 3 11/12/23 11/14/23 Unrelated   Discharged 11/14/23; no prescription pain medication prescribed at discharge.  Back Pain Grade 1 11/15/23 ongoing Unrelated OTC Tylenol  PRN    Constipation Grade 1 11/20/23 01/28/24 Unrelated   Dietary modification only at this time; aware of OTC options, if needed.  Denies current use of medication to manage.  Diarrhea  Grade 1 01/13/24  Unrelated to study Uses Imodium prn Increase of 3 stools/day over baseline; happens when she takes adagrasib      EKG ASSESSMENT: EKG assessment is not required for this visit.  NEUROCOGNITIVE ASSESSMENT: Neurocognitive assessment is completed by Hobert Cheney.  GIFT CARD: This study does not provide visit compensation.   DISPOSITION: Upon completion off all study requirements, patient was escorted to the lab.   The patient was thanked for their time and continued voluntary participation in this study. [name] has been provided direct contact information and is encouraged to contact this Nurse for any needs or questions.  Andrea MORTON Nanami Whitelaw, RN, BSN, [facility]  [facility] Clinical Research  Nurse [facility] Direct Dial [phone] 02/20/2024 9:01 AM

## 2024-02-19 NOTE — Addendum Note (Signed)
 Encounter addended by: Mohamed-Medani, Elverna LABOR, RN on: 02/19/2024 4:10 PM  Actions taken: Medication List reviewed

## 2024-02-20 ENCOUNTER — Inpatient Hospital Stay (HOSPITAL_BASED_OUTPATIENT_CLINIC_OR_DEPARTMENT_OTHER): Admitting: Internal Medicine

## 2024-02-20 VITALS — BP 120/74 | HR 82 | Temp 97.9°F | Resp 17 | Ht 67.0 in | Wt 146.1 lb

## 2024-02-20 DIAGNOSIS — C3490 Malignant neoplasm of unspecified part of unspecified bronchus or lung: Secondary | ICD-10-CM

## 2024-02-20 DIAGNOSIS — C7931 Secondary malignant neoplasm of brain: Secondary | ICD-10-CM | POA: Diagnosis not present

## 2024-02-20 NOTE — Progress Notes (Signed)
 [facility] [phone]   [phone]  OFFICE PROGRESS NOTE  Regino Slater, MD [address] [place] [address]  DIAGNOSIS:  1) recurrent/metastatic non-small cell lung cancer, adenocarcinoma presented with metastatic adenopathy in the thorax as well as mediastinum, axillary lymph nodes and potential local recurrence at the right hilum along the inferior margin of the resection staple line with bilateral hypermetabolic adrenal metastasis and hypermetabolic bone metastasis to the T11 vertebral body as well as brain metastasis diagnosed in December 2024. 2) incidental bilaterally pulmonary embolism diagnosed on CT scan of the chest on October 22, 2023 2) Stage IA (T1a, N0, M0) non-small cell lung cancer, adenocarcinoma presented with right upper lobe pulmonary nodule   Detected Alteration(s) / Biomarker(s) Associated FDA-approved therapies Clinical Trial Availability % cfDNA or Amplification KRAS G12C approved by FDA Adagrasib , Sotorasib approved in other indication Adagrasib +cetuximab Yes 2.8%  TP53 E204* None Yes 2.0%   PRIOR THERAPY:  1) S/P right VATS with right upper lobectomy and mediastinal lymph node dissection under the care of Dr. Kerrin on October 15, 2016. 2) whole brain irradiation under the care of of Dr. Izell. 3) Systemic chemotherapy with carboplatin  for AUC of 5, Alimta  500 Mg/M2 and Keytruda  200 Mg IV every 3 weeks.  First dose August 20, 2022.  Status post 7   cycles.  Starting from cycle #5 she has been on maintenance treatment with Alimta  and Keytruda  every 3 weeks for 3 more cycles.  Last dose was given on December 29, 2023 discontinued secondary to disease progression CURRENT THERAPY:  1) Krazati  (Adagrasib ) 600 mg p.o. twice daily started on 01/23/2024. 2) Eliquis  starter kit with 10 mg p.o. twice daily for 7 days followed by 5 mg p.o. daily.  First dose started October 18, 2023.   INTERVAL HISTORY: [name] 68 y.o. female returns to the clinic today for annual follow-up visit. Discussed the use of AI scribe software for clinical note transcription with the patient, who gave verbal consent to proceed.  History of Present Illness [name] is a 68 year old female with metastatic non-small cell lung cancer who presents for evaluation and repeat blood work.  She was diagnosed with metastatic non-small cell lung cancer in December 2024, with a positive KRAS G12C mutation. Initially, she received first-line systemic chemotherapy with carboplatin , pemetrexed , and pembrolizumab , which was discontinued due to disease progression.  Currently, she is undergoing treatment with oral targeted therapy using Krazati  (adagrasib ) at a dose of 600 mg daily, which was started on January 23, 2024. She experiences nausea and vomiting shortly after taking the medication. She manages these symptoms with nausea medication and Imodium for diarrhea, which occurs two to three times a day. She takes a larger dose of Imodium to help control the diarrhea.  She has completed radiation therapy to the brain. Recent lab work shows a hemoglobin level of 9.0 g/dL and a platelet count at 105,000/L. Her serum creatinine level is 1.66 mg/dL, down from 8.08 mg/dL two weeks prior.  No other significant issues are reported at this time.    MEDICAL HISTORY: Past Medical History:  Diagnosis Date   Adenocarcinoma of right lung, stage 1 (HCC) 11/29/2016   Chronic pain    Depression    takes Lexapro  daily   Diabetes mellitus    takes Metformin  daily and has an insulin  pump.Fasting blood sugar runs250   GERD (gastroesophageal reflux disease)    takes Pantoprazole  daily  History of colon polyps    benign   Hyperlipidemia    takes Atorvastatin  daily   Hypertension    takes Amlodipine  and Lisinopril  daily   Insomnia    takes Trazodone  nightly   Nocturia    recurrent lung ca with mets to brain, LN, bone and adrenal gland  06/2023   Thyroid  disease    Urinary frequency    Urinary urgency     ALLERGIES:  is allergic to synthroid [levothyroxine sodium], voltaren  [diclofenac  sodium], hydrochlorothiazide, and iodine.  MEDICATIONS:  Current Outpatient Medications  Medication Sig Dispense Refill   ACCU-CHEK AVIVA PLUS test strip USE TO MONITOR GLUCOSE LEVELS TWICE A DAY E11.9 100 strip 12   ACCU-CHEK SOFTCLIX LANCETS lancets Use to monitor glucose levels BID; E11.9 (Patient taking differently: 1 each by Other route 2 (two) times daily. E11.9) 100 each 12   acetaminophen  (TYLENOL ) 500 MG tablet Take 1 tablet (500 mg total) by mouth every 6 (six) hours as needed. (Patient taking differently: Take 500 mg by mouth every 6 (six) hours as needed (for pain).) 30 tablet 0   adagrasib  (KRAZATI ) 200 MG tablet Take 3 tablets (600 mg total) by mouth 2 (two) times daily. 180 tablet 3   atorvastatin  (LIPITOR) 20 MG tablet Take 20 mg by mouth daily.  11   Blood Glucose Calibration (GLUCOSE CONTROL) SOLN 1 Bottle by In Vitro route as needed. Use to calibrate Accu-Chek Aviva Plus device prn; please provide control solution compatible with Accu-Chek Aviva Plus device. (Patient taking differently: 1 each by Other route as needed (Use to calibrate glucometer). E11.9) 1 each 1   Blood Glucose Monitoring Suppl (ACCU-CHEK AVIVA PLUS) w/Device KIT 1 each by Does not apply route 2 (two) times daily. Use to monitor glucose levels BID; E11.9 (Patient taking differently: 1 each by Does not apply route 2 (two) times daily. E11.9) 1 kit 0   clotrimazole  (MYCELEX ) 10 MG troche Take 10 mg by mouth 5 (five) times daily.     escitalopram  (LEXAPRO ) 10 MG tablet Take 1 tablet (10 mg total) by mouth every morning. 90 tablet 3   ferrous sulfate  325 (65 FE) MG tablet Take 1 tablet (325 mg total) by mouth 2 (two) times daily with a meal. 60 tablet 0   insulin  glargine (LANTUS  SOLOSTAR) 100 UNIT/ML Solostar Pen Inject 15 Units into the skin at bedtime.  (Patient taking differently: Inject 15 Units into the skin at bedtime as needed (high bs).) 15 mL 3   Insulin  Pen Needle (B-D ULTRAFINE III SHORT PEN) 31G X 8 MM MISC USE AS DIRECTED 100 each 5   lidocaine  (XYLOCAINE ) 2 % solution every 6 (six) hours as needed for mouth pain. Apply topically to mouth for sores     loperamide (IMODIUM) 2 MG capsule Take 2 mg by mouth as needed for diarrhea or loose stools.     magic mouthwash SOLN Take 5 mLs by mouth 3 (three) times daily as needed for mouth pain. Suspension contains equal amounts of Maalox Extra Strength, nystatin, and diphenhydramine .     memantine  (NAMENDA ) 10 MG tablet Take 1 tablet (10 mg total) by mouth 2 (two) times daily. Take through 01-20-24, then stop. (Patient not taking: Reported on 02/19/2024) 180 tablet 0   pantoprazole  (PROTONIX ) 40 MG tablet TAKE 1 TABLET BY MOUTH TWICE A DAY 30 MINS BEFORE YOUR FIRST AND LAST MEAL. 180 tablet 1   traZODone  (DESYREL ) 100 MG tablet Take 2 tablets (200 mg total) by mouth at bedtime.  180 tablet 3   No current facility-administered medications for this visit.    SURGICAL HISTORY:  Past Surgical History:  Procedure Laterality Date   ABDOMINAL HYSTERECTOMY     ABDOMINAL SURGERY     BIOPSY  05/28/2019   Procedure: BIOPSY;  Surgeon: Harvey Margo CROME, MD;  Location: [facility];  Service: Endoscopy;;  random colon/gastric/duodenum   BIOPSY  01/11/2023   Procedure: BIOPSY;  Surgeon: Cindie Carlin POUR, DO;  Location: [facility];  Service: Endoscopy;;   COLONOSCOPY  2005 MAC   TICs, IH   COLONOSCOPY N/A 03/08/2014   Procedure: COLONOSCOPY;  Surgeon: Margo CROME Harvey, MD;  Location: [facility];  Service: Endoscopy;  Laterality: N/A;  1015   COLONOSCOPY WITH PROPOFOL  N/A 05/28/2019   Procedure: COLONOSCOPY WITH PROPOFOL ;  Surgeon: Harvey Margo CROME, MD;  [facility]: [facility];  Service: Endoscopy;  Laterality: N/A;  9:15am   CYSTOSTOMY  11/22/2011   Procedure: CYSTOSTOMY SUPRAPUBIC;  Surgeon: Emery LILLETTE Blaze, MD;  Location: [facility];  Service: Urology;  Laterality: N/A;   ESOPHAGOGASTRODUODENOSCOPY N/A 03/08/2014   Procedure: ESOPHAGOGASTRODUODENOSCOPY (EGD);  Surgeon: Margo CROME Harvey, MD;  [facility]: [facility];  Service: Endoscopy;  Laterality: N/A;   ESOPHAGOGASTRODUODENOSCOPY     ESOPHAGOGASTRODUODENOSCOPY (EGD) WITH PROPOFOL  N/A 05/28/2019   Procedure: ESOPHAGOGASTRODUODENOSCOPY (EGD) WITH PROPOFOL ;  Surgeon: Harvey Margo CROME, MD;  [facility];  Service: Endoscopy;  Laterality: N/A;   ESOPHAGOGASTRODUODENOSCOPY (EGD) WITH PROPOFOL  N/A 01/11/2023   Procedure: ESOPHAGOGASTRODUODENOSCOPY (EGD) WITH PROPOFOL ;  Surgeon: Cindie Carlin POUR, DO;  [facility]: [facility];  Service: Endoscopy;  Laterality: N/A;  9:45 am, asa 3   GIVENS CAPSULE STUDY N/A 06/15/2019   Procedure: GIVENS CAPSULE STUDY;  Surgeon: Harvey Margo CROME, MD;  [facility]: [facility];  Service: Endoscopy;  Laterality: N/A;  7:30am   HALLUX VALGUS CORRECTION     bilateral foot surgery for bone repairs-multiple   IR IMAGING GUIDED PORT INSERTION  08/19/2023   LOBECTOMY Right 10/15/2016   Procedure: RIGHT UPPER LOBECTOMY;  Surgeon: Elspeth JAYSON Millers, MD;  Location[facility] OR;  Service: Thoracic;  Laterality: Right;   LYMPH NODE DISSECTION Right 10/15/2016   Procedure: LYMPH NODE DISSECTION;  Surgeon: Elspeth JAYSON Millers, MD;  Location[facility] OR;  Service: Thoracic;  Laterality: Right;   POLYPECTOMY  05/28/2019   Procedure: POLYPECTOMY;  Surgeon: Harvey Margo CROME, MD;  [facility];  Service: Endoscopy;;   SAVORY DILATION N/A 05/28/2019   Procedure: SAVORY DILATION;  Surgeon: Harvey Margo CROME, MD;  [facility];  Service: Endoscopy;  Laterality: N/A;  15/16/17   VAGINA RECONSTRUCTION SURGERY     tvt 2006- [place]    VIDEO ASSISTED THORACOSCOPY (VATS)/WEDGE RESECTION Right 10/15/2016   Procedure: RIGHT VIDEO ASSISTED THORACOSCOPY (VATS)/WEDGE RESECTION;  Surgeon: Elspeth JAYSON Millers, MD;  Location: [facility]  OR;  Service: Thoracic;  Laterality: Right;    REVIEW OF SYSTEMS:  Constitutional: positive for fatigue Eyes: negative Ears, nose, mouth, throat, and face: negative Respiratory: negative Cardiovascular: negative Gastrointestinal: positive for diarrhea and nausea Genitourinary:negative Integument/breast: negative Hematologic/lymphatic: negative Musculoskeletal:negative Neurological: positive for weakness Behavioral/Psych: negative Endocrine: negative Allergic/Immunologic: negative   PHYSICAL EXAMINATION: General appearance: alert, cooperative, fatigued, and no distress Head: Normocephalic, without obvious abnormality, atraumatic Neck: no adenopathy, no JVD, supple, symmetrical, trachea midline, and thyroid  not enlarged, symmetric, no tenderness/mass/nodules Lymph nodes: Cervical, supraclavicular, and axillary nodes normal. Resp: clear to auscultation bilaterally Back: symmetric, no curvature. ROM normal. No CVA tenderness. Cardio:  regular rate and rhythm, S1, S2 normal, no murmur, click, rub or gallop GI: soft, non-tender; bowel sounds normal; no masses,  no organomegaly Extremities: extremities normal, atraumatic, no cyanosis or edema Neurologic: Alert and oriented X 3, normal strength and tone. Normal symmetric reflexes. Normal coordination and gait  ECOG PERFORMANCE STATUS: 1 - Symptomatic but completely ambulatory  Blood pressure 120/74, pulse 82, temperature 97.9 F (36.6 C), temperature source Temporal, resp. rate 17, height 5' 7 (1.702 m), weight 146 lb 1.6 oz (66.3 kg), SpO2 100%.  LABORATORY DATA: Lab Results  Component Value Date   WBC 4.0 02/19/2024   HGB 9.0 (L) 02/19/2024   HCT 25.5 (L) 02/19/2024   MCV 86.7 02/19/2024   PLT 105 (L) 02/19/2024      Chemistry      Component Value Date/Time   NA 136 02/19/2024 1315   NA 142 10/29/2022 1158   NA 141 05/29/2017 0921   K 4.0 02/19/2024 1315   K 4.8 05/29/2017 0921   CL 101 02/19/2024 1315   CO2 29  02/19/2024 1315   CO2 28 05/29/2017 0921   BUN 12 02/19/2024 1315   BUN 21 10/29/2022 1158   BUN 13.5 05/29/2017 0921   CREATININE 1.66 (H) 02/19/2024 1315   CREATININE 0.8 05/29/2017 0921      Component Value Date/Time   CALCIUM  8.6 (L) 02/19/2024 1315   CALCIUM  9.5 05/29/2017 0921   ALKPHOS 94 02/19/2024 1315   ALKPHOS 104 05/29/2017 0921   AST 30 02/19/2024 1315   AST 17 05/29/2017 0921   ALT 10 02/19/2024 1315   ALT 20 05/29/2017 0921   BILITOT 0.5 02/19/2024 1315   BILITOT 0.40 05/29/2017 0921       RADIOGRAPHIC STUDIES: No results found.     ASSESSMENT AND PLAN: This is a very pleasant 68 years old white female with stage IV (T1a, N3, M1 C) non-small cell lung cancer initially diagnosed as stage IA non-small cell lung cancer status post right upper lobectomy with lymph node dissection.  This was diagnosed in March 2018 with evidence for disease recurrence and metastasis in December 2024. The patient has been on observation and she is feeling fine today with no concerning complaints. The patient had repeat CT scan of the chest performed recently.  I personally independently reviewed the scan images and discussed the results with the patient today.  Unfortunately her scan showed interval development of several abnormal lymph nodes throughout the chest including right hilar, mediastinal, supraclavicular and bilateral axillary as well as upper abdominal lymphadenopathy.  There was also increasing right adrenal nodule worrisome for adrenal metastasis. She underwent ultrasound-guided core biopsy of right axillary lymph node and the final pathology was consistent with metastatic adenocarcinoma of lung primary.  We will send the tissue block to [facility] for molecular studies and PD-L1 expression.  Molecular studies by [email or web address] showed positive KRAS G12C mutation. She underwent whole brain radiation for brain metastasis. The patient is here today to start the first dose of  systemic chemotherapy with carboplatin  for AUC of 5, Alimta  500 Mg/M2 and Keytruda  200 Mg IV every 3 weeks.  First dose August 21, 2023.  Status post 7 cycles. She has been tolerating her treatment fairly well except for the recent syncopal episode secondary to anemia. She had repeat CT scan of the chest, abdomen and pelvis performed recently.  I personally and independently reviewed the scan and discussed the results with the patient today.  Unfortunately her scan showed evidence for disease  progression with increasing bilateral axillary lymph node as well as adrenal gland lesions. She is currently on Krazati  (Adagrasib ) 600 mg p.o. twice daily started on 01/23/2024. She has been tolerating this treatment well except for few episodes of nausea and diarrhea. Assessment and Plan Assessment & Plan Metastatic non-small cell lung cancer with positive KRSG12C mutation Currently undergoing treatment with Krazati  (adagrasib ) 600 mg. Experiencing nausea, vomiting, and diarrhea. Previous systemic chemotherapy was discontinued due to disease progression. Lab work shows well-managed anemia and thrombocytopenia, with improved kidney function. - Administer Zofran  30 minutes before Krazati  to manage nausea and vomiting. - Encourage hydration of at least 64 ounces of water  daily. - Continue Imodium for diarrhea management. - Arrange MRI. - Order CT scan of the chest, abdomen, and pelvis one week before the next visit.  Anemia due to chemotherapy Anemia secondary to previous chemotherapy with a current hemoglobin level of 9.0 g/dL. The anemia is well-managed and not significantly impacting current treatment. The patient was advised to call immediately if she has any other concerning symptoms in the interval. The total time spent in the appointment was 30 minutes including review of chart and various tests results, discussions about plan of care and coordination of care plan .   The patient understands that this  treatment is palliative in nature and there is no cure for her condition.  The goal of treatment is prolongation of life with palliation of her symptoms. The patient voices understanding of current disease status and treatment options and is in agreement with the current care plan. All questions were answered. The patient knows to call the clinic with any problems, questions or concerns. We can certainly see the patient much sooner if necessary.  Disclaimer: This note was dictated with voice recognition software. Similar sounding words can inadvertently be transcribed and may not be corrected upon review.

## 2024-02-21 ENCOUNTER — Other Ambulatory Visit: Payer: Self-pay

## 2024-02-21 DIAGNOSIS — C7931 Secondary malignant neoplasm of brain: Secondary | ICD-10-CM

## 2024-02-21 NOTE — Addendum Note (Signed)
 Addended by: Glyn Gerads G on: 02/21/2024 02:46 PM   Modules accepted: Orders

## 2024-02-24 ENCOUNTER — Encounter: Payer: Self-pay | Admitting: Nurse Practitioner

## 2024-02-24 ENCOUNTER — Ambulatory Visit (INDEPENDENT_AMBULATORY_CARE_PROVIDER_SITE_OTHER): Admitting: Nurse Practitioner

## 2024-02-24 VITALS — BP 98/70 | HR 64 | Ht 67.0 in | Wt 143.4 lb

## 2024-02-24 DIAGNOSIS — E119 Type 2 diabetes mellitus without complications: Secondary | ICD-10-CM

## 2024-02-24 DIAGNOSIS — E559 Vitamin D deficiency, unspecified: Secondary | ICD-10-CM | POA: Diagnosis not present

## 2024-02-24 DIAGNOSIS — Z794 Long term (current) use of insulin: Secondary | ICD-10-CM

## 2024-02-24 DIAGNOSIS — Z7984 Long term (current) use of oral hypoglycemic drugs: Secondary | ICD-10-CM

## 2024-02-24 DIAGNOSIS — Z7985 Long-term (current) use of injectable non-insulin antidiabetic drugs: Secondary | ICD-10-CM

## 2024-02-24 DIAGNOSIS — I10 Essential (primary) hypertension: Secondary | ICD-10-CM

## 2024-02-24 DIAGNOSIS — E782 Mixed hyperlipidemia: Secondary | ICD-10-CM

## 2024-02-24 LAB — POCT GLYCOSYLATED HEMOGLOBIN (HGB A1C): Hemoglobin A1C: 5.6 % (ref 4.0–5.6)

## 2024-02-24 NOTE — Progress Notes (Signed)
 Endocrinology Follow Up Note       02/24/2024, 10:59 AM   Subjective:    Patient ID: [name], female    DOB: August 16, 1955.  [name] is being seen in follow up after being seen in consultation for management of currently uncontrolled symptomatic diabetes requested by  Regino Slater, MD.   Past Medical History:  Diagnosis Date   Adenocarcinoma of right lung, stage 1 (HCC) 11/29/2016   Chronic pain    Depression    takes Lexapro  daily   Diabetes mellitus    takes Metformin  daily and has an insulin  pump.Fasting blood sugar runs250   GERD (gastroesophageal reflux disease)    takes Pantoprazole  daily   History of colon polyps    benign   Hyperlipidemia    takes Atorvastatin  daily   Hypertension    takes Amlodipine  and Lisinopril  daily   Insomnia    takes Trazodone  nightly   Nocturia    recurrent lung ca with mets to brain, LN, bone and adrenal gland 06/2023   Thyroid  disease    Urinary frequency    Urinary urgency     Past Surgical History:  Procedure Laterality Date   ABDOMINAL HYSTERECTOMY     ABDOMINAL SURGERY     BIOPSY  05/28/2019   Procedure: BIOPSY;  Surgeon: Harvey Margo CROME, MD;  [facility]: [facility];  Service: Endoscopy;;  random colon/gastric/duodenum   BIOPSY  01/11/2023   Procedure: BIOPSY;  Surgeon: Cindie Carlin POUR, DO;  Location: [facility];  Service: Endoscopy;;   COLONOSCOPY  2005 MAC   TICs, IH   COLONOSCOPY N/A 03/08/2014   Procedure: COLONOSCOPY;  Surgeon: Margo CROME Harvey, MD;  [facility]: [facility];  Service: Endoscopy;  Laterality: N/A;  1015   COLONOSCOPY WITH PROPOFOL  N/A 05/28/2019   Procedure: COLONOSCOPY WITH PROPOFOL ;  Surgeon: Harvey Margo CROME, MD;  [facility];  Service: Endoscopy;  Laterality: N/A;  9:15am   CYSTOSTOMY  11/22/2011   Procedure: CYSTOSTOMY SUPRAPUBIC;  Surgeon: Emery LILLETTE Blaze, MD;  Location: [facility];  Service: Urology;   Laterality: N/A;   ESOPHAGOGASTRODUODENOSCOPY N/A 03/08/2014   Procedure: ESOPHAGOGASTRODUODENOSCOPY (EGD);  Surgeon: Margo CROME Harvey, MD;  [facility];  Service: Endoscopy;  Laterality: N/A;   ESOPHAGOGASTRODUODENOSCOPY     ESOPHAGOGASTRODUODENOSCOPY (EGD) WITH PROPOFOL  N/A 05/28/2019   Procedure: ESOPHAGOGASTRODUODENOSCOPY (EGD) WITH PROPOFOL ;  Surgeon: Harvey Margo CROME, MD;  [facility];  Service: Endoscopy;  Laterality: N/A;   ESOPHAGOGASTRODUODENOSCOPY (EGD) WITH PROPOFOL  N/A 01/11/2023   Procedure: ESOPHAGOGASTRODUODENOSCOPY (EGD) WITH PROPOFOL ;  Surgeon: Cindie Carlin POUR, DO;  [facility];  Service: Endoscopy;  Laterality: N/A;  9:45 am, asa 3   GIVENS CAPSULE STUDY N/A 06/15/2019   Procedure: GIVENS CAPSULE STUDY;  Surgeon: Harvey Margo CROME, MD;  [facility];  Service: Endoscopy;  Laterality: N/A;  7:30am   HALLUX VALGUS CORRECTION     bilateral foot surgery for bone repairs-multiple   IR IMAGING GUIDED PORT INSERTION  08/19/2023   LOBECTOMY Right 10/15/2016   Procedure: RIGHT UPPER LOBECTOMY;  Surgeon: Elspeth JAYSON Millers, MD;  Location: [facility];  Service: Thoracic;  Laterality: Right;  LYMPH NODE DISSECTION Right 10/15/2016   Procedure: LYMPH NODE DISSECTION;  Surgeon: Elspeth JAYSON Millers, MD;  Location: [facility];  Service: Thoracic;  Laterality: Right;   POLYPECTOMY  05/28/2019   Procedure: POLYPECTOMY;  Surgeon: Harvey Margo CROME, MD;  [facility];  Service: Endoscopy;;   SAVORY DILATION N/A 05/28/2019   Procedure: SAVORY DILATION;  Surgeon: Harvey Margo CROME, MD;  [facility];  Service: Endoscopy;  Laterality: N/A;  15/16/17   VAGINA RECONSTRUCTION SURGERY     tvt 2006- [facility]    VIDEO ASSISTED THORACOSCOPY (VATS)/WEDGE RESECTION Right 10/15/2016   Procedure: RIGHT VIDEO ASSISTED THORACOSCOPY (VATS)/WEDGE RESECTION;  Surgeon: Elspeth JAYSON Millers, MD;  Location: [facility];  Service: Thoracic;  Laterality: Right;     Social History   Socioeconomic History   Marital status: Single    Spouse name: Not on file   Number of children: Not on file   Years of education: Not on file   Highest education level: Not on file  Occupational History   Not on file  Tobacco Use   Smoking status: Former    Current packs/day: 1.00    Average packs/day: 1 pack/day for 32.0 years (32.0 ttl pk-yrs)    Types: Cigarettes    Passive exposure: Never   Smokeless tobacco: Never   Tobacco comments:    quit smoking 20 yrs ago  Vaping Use   Vaping status: Never Used  Substance and Sexual Activity   Alcohol use: No    Comment: rarely   Drug use: No   Sexual activity: Not on file  Other Topics Concern   Not on file  Social History Narrative   Not on file   Social Drivers of Health   Financial Resource Strain: Not on file  Food Insecurity: No Food Insecurity (11/13/2023)   Hunger Vital Sign    Worried About Running Out of Food in the Last Year: Never true    Ran Out of Food in the Last Year: Never true  Transportation Needs: No Transportation Needs (11/13/2023)   PRAPARE - Administrator, Civil Service (Medical): No    Lack of Transportation (Non-Medical): No  Physical Activity: Not on file  Stress: Not on file  Social Connections: Moderately Integrated (11/13/2023)   Social Connection and Isolation Panel    Frequency of Communication with Friends and Family: More than three times a week    Frequency of Social Gatherings with Friends and Family: More than three times a week    Attends Religious Services: More than 4 times per year    Active Member of [employer] or Organizations: Yes    Attends Banker Meetings: 1 to 4 times per year    Marital Status: Never married    Family History  Problem Relation Age of Onset   Asthma Other    Diabetes Other    Arthritis Other    Cancer Mother    Cancer Brother    Anesthesia problems Neg Hx    Hypotension Neg Hx    Malignant hyperthermia Neg  Hx    Pseudochol deficiency Neg Hx    Colon polyps Neg Hx    Colon cancer Neg Hx    Celiac disease Neg Hx    Pancreatic cancer Neg Hx    Stomach cancer Neg Hx    Ulcerative colitis Neg Hx    Crohn's disease Neg Hx     Outpatient Encounter Medications as of 02/24/2024  Medication Sig  ACCU-CHEK AVIVA PLUS test strip USE TO MONITOR GLUCOSE LEVELS TWICE A DAY E11.9   ACCU-CHEK SOFTCLIX LANCETS lancets Use to monitor glucose levels BID; E11.9   acetaminophen  (TYLENOL ) 500 MG tablet Take 1 tablet (500 mg total) by mouth every 6 (six) hours as needed.   adagrasib  (KRAZATI ) 200 MG tablet Take 3 tablets (600 mg total) by mouth 2 (two) times daily.   atorvastatin  (LIPITOR) 20 MG tablet Take 20 mg by mouth daily.   Blood Glucose Calibration (GLUCOSE CONTROL) SOLN 1 Bottle by In Vitro route as needed. Use to calibrate Accu-Chek Aviva Plus device prn; please provide control solution compatible with Accu-Chek Aviva Plus device.   Blood Glucose Monitoring Suppl (ACCU-CHEK AVIVA PLUS) w/Device KIT 1 each by Does not apply route 2 (two) times daily. Use to monitor glucose levels BID; E11.9 (Patient taking differently: 1 each by Does not apply route 2 (two) times daily. E11.9)   clotrimazole  (MYCELEX ) 10 MG troche Take 10 mg by mouth 5 (five) times daily.   escitalopram  (LEXAPRO ) 10 MG tablet Take 1 tablet (10 mg total) by mouth every morning.   ferrous sulfate  325 (65 FE) MG tablet Take 1 tablet (325 mg total) by mouth 2 (two) times daily with a meal.   lidocaine  (XYLOCAINE ) 2 % solution every 6 (six) hours as needed for mouth pain. Apply topically to mouth for sores   loperamide (IMODIUM) 2 MG capsule Take 2 mg by mouth as needed for diarrhea or loose stools.   magic mouthwash SOLN Take 5 mLs by mouth 3 (three) times daily as needed for mouth pain. Suspension contains equal amounts of Maalox Extra Strength, nystatin, and diphenhydramine .   pantoprazole  (PROTONIX ) 40 MG tablet TAKE 1 TABLET BY MOUTH TWICE  A DAY 30 MINS BEFORE YOUR FIRST AND LAST MEAL.   traZODone  (DESYREL ) 100 MG tablet Take 2 tablets (200 mg total) by mouth at bedtime.   [DISCONTINUED] insulin  glargine (LANTUS  SOLOSTAR) 100 UNIT/ML Solostar Pen Inject 15 Units into the skin at bedtime. (Patient taking differently: Inject 15 Units into the skin at bedtime as needed (high bs).)   [DISCONTINUED] Insulin  Pen Needle (B-D ULTRAFINE III SHORT PEN) 31G X 8 MM MISC USE AS DIRECTED   [DISCONTINUED] memantine  (NAMENDA ) 10 MG tablet Take 1 tablet (10 mg total) by mouth 2 (two) times daily. Take through 01-20-24, then stop. (Patient not taking: Reported on 02/24/2024)   No facility-administered encounter medications on file as of 02/24/2024.    ALLERGIES: Allergies  Allergen Reactions   Synthroid [Levothyroxine Sodium] Anaphylaxis   Voltaren  [Diclofenac  Sodium] Other (See Comments)    World wouldn't stop spinning NSAIDs shouldn't be taken by pt   Hydrochlorothiazide Other (See Comments)    Cramps    Iodine Rash    I get a skin rash sometimes if iodine applied to my skin.    VACCINATION STATUS: Immunization History  Administered Date(s) Administered   Influenza, High Dose Seasonal PF 04/19/2019, 05/08/2023   Moderna Sars-Covid-2 Vaccination 06/16/2020    Diabetes She presents for her follow-up diabetic visit. She has type 2 diabetes mellitus. Onset time: diagnosed at approx age of 89. Her disease course has been improving. There are no hypoglycemic associated symptoms. There are no diabetic associated symptoms. There are no hypoglycemic complications. There are no diabetic complications. Risk factors for coronary artery disease include diabetes mellitus, obesity, hypertension and post-menopausal. When asked about current treatments, none were reported. Her weight is decreasing steadily. She is following a generally healthy diet. Meal  planning includes avoidance of concentrated sweets. She has not had a previous visit with a  dietitian. She participates in exercise daily. (She presents today with no meter or logs to review.  She could not open her CGM to be able to apply it.  Her POCT A1c today is 5.6%, improving from last visit of 7.8%.  She has not needed any insulin  in quite some time.  Her appetite is still poor.) An ACE inhibitor/angiotensin II receptor blocker is being taken. She sees a podiatrist.Eye exam is current.     Review of systems  Constitutional: + steadily decreasing body weight,  current Body mass index is 22.46 kg/m. , + fatigue, no subjective hyperthermia, no subjective hypothermia, + decreased appetite due to chemotherapy Eyes: no blurry vision, no xerophthalmia ENT: no sore throat, no nodules palpated in throat, no dysphagia/odynophagia, no hoarseness Cardiovascular: no chest pain, no shortness of breath, no palpitations, no leg swelling Respiratory: no cough, no shortness of breath Gastrointestinal: no nausea/vomiting/diarrhea Skin: no rashes, no hyperemia Neurological: no tremors, no numbness, no tingling, no dizziness Psychiatric: no depression, no anxiety  Objective:     BP 98/70 (BP Location: Right Arm, Patient Position: Sitting, Cuff Size: Large)   Pulse 64   Ht 5' 7 (1.702 m)   Wt 143 lb 6.4 oz (65 kg)   BMI 22.46 kg/m   Wt Readings from Last 3 Encounters:  02/24/24 143 lb 6.4 oz (65 kg)  02/20/24 146 lb 1.6 oz (66.3 kg)  02/19/24 145 lb 9.6 oz (66 kg)     BP Readings from Last 3 Encounters:  02/24/24 98/70  02/20/24 120/74  02/19/24 138/81      Physical Exam- Limited  Constitutional:  Body mass index is 22.46 kg/m. , not in acute distress, normal state of mind Eyes:  EOMI, no exophthalmos Musculoskeletal: no gross deformities, strength intact in all four extremities, no gross restriction of joint movements Skin:  no rashes, no hyperemia Neurological: no tremor with outstretched hands   Diabetic Foot Exam - Simple   No data filed     CMP ( most  recent) CMP     Component Value Date/Time   NA 136 02/19/2024 1315   NA 142 10/29/2022 1158   NA 141 05/29/2017 0921   K 4.0 02/19/2024 1315   K 4.8 05/29/2017 0921   CL 101 02/19/2024 1315   CO2 29 02/19/2024 1315   CO2 28 05/29/2017 0921   GLUCOSE 109 (H) 02/19/2024 1315   GLUCOSE 184 (H) 05/29/2017 0921   BUN 12 02/19/2024 1315   BUN 21 10/29/2022 1158   BUN 13.5 05/29/2017 0921   CREATININE 1.66 (H) 02/19/2024 1315   CREATININE 0.8 05/29/2017 0921   CALCIUM  8.6 (L) 02/19/2024 1315   CALCIUM  9.5 05/29/2017 0921   PROT 6.1 (L) 02/19/2024 1315   PROT 6.2 10/29/2022 1158   PROT 7.1 05/29/2017 0921   ALBUMIN  3.1 (L) 02/19/2024 1315   ALBUMIN  4.3 10/29/2022 1158   ALBUMIN  80 mg/L 05/17/2022 0922   ALBUMIN  3.6 05/29/2017 0921   AST 30 02/19/2024 1315   AST 17 05/29/2017 0921   ALT 10 02/19/2024 1315   ALT 20 05/29/2017 0921   ALKPHOS 94 02/19/2024 1315   ALKPHOS 104 05/29/2017 0921   BILITOT 0.5 02/19/2024 1315   BILITOT 0.40 05/29/2017 0921   GFRNONAA 33 (L) 02/19/2024 1315   GFRAA >60 12/08/2019 0849     Diabetic Labs (most recent): Lab Results  Component Value Date   HGBA1C  5.6 02/24/2024   HGBA1C 7.8 (A) 10/23/2023   HGBA1C 6.6 06/25/2023     Lipid Panel ( most recent) Lipid Panel     Component Value Date/Time   CHOL 146 10/29/2022 1158   TRIG 113 10/29/2022 1158   HDL 62 10/29/2022 1158   CHOLHDL 2.4 10/29/2022 1158   LDLCALC 64 10/29/2022 1158   LABVLDL 20 10/29/2022 1158      Lab Results  Component Value Date   TSH 1.600 12/12/2023   TSH 1.309 10/08/2023   TSH 2.489 10/02/2023   TSH 1.243 08/21/2023   TSH 0.871 10/29/2022   TSH 2.93 07/11/2016   FREET4 1.22 10/29/2022           Assessment & Plan:   1) Type 2 diabetes mellitus without complication, with long-term current use of insulin  (HCC)  She presents today with no meter or logs to review.  She could not open her CGM to be able to apply it.  Her POCT A1c today is 5.6%, improving  from last visit of 7.8%.  She has not needed any insulin  in quite some time.  Her appetite is still poor.  - CAMBREIGH DEARING has currently uncontrolled symptomatic type 2 DM since 68 years of age.   -Recent labs reviewed.  - I had a long discussion with her about the progressive nature of diabetes and the pathology behind its complications. -her diabetes is not currently complicated but she remains at a high risk for more acute and chronic complications which include CAD, CVA, CKD, retinopathy, and neuropathy. These are all discussed in detail with her.  The following Lifestyle Medicine recommendations according to American College of Lifestyle Medicine [facility]) were discussed and offered to patient and she agrees to start the journey:  A. Whole Foods, Plant-based plate comprising of fruits and vegetables, plant-based proteins, whole-grain carbohydrates was discussed in detail with the patient.   A list for source of those nutrients were also provided to the patient.  Patient will use only water  or unsweetened tea for hydration. B.  The need to stay away from risky substances including alcohol, smoking; obtaining 7 to 9 hours of restorative sleep, at least 150 minutes of moderate intensity exercise weekly, the importance of healthy social connections,  and stress reduction techniques were discussed. C.  A full color page of  Calorie density of various food groups per pound showing examples of each food groups was provided to the patient.  - Nutritional counseling repeated at each appointment due to patients tendency to fall back in to old habits.  - The patient admits there is a room for improvement in their diet and drink choices. -  Suggestion is made for the patient to avoid simple carbohydrates from their diet including Cakes, Sweet Desserts / Pastries, Ice Cream, Soda (diet and regular), Sweet Tea, Candies, Chips, Cookies, Sweet Pastries, Store Bought Juices, Alcohol in Excess of 1-2 drinks a  day, Artificial Sweeteners, Coffee Creamer, and Sugar-free Products. This will help patient to have stable blood glucose profile and potentially avoid unintended weight gain.   - I encouraged the patient to switch to unprocessed or minimally processed complex starch and increased protein intake (animal or plant source), fruits, and vegetables.   - Patient is advised to stick to a routine mealtimes to eat 3 meals a day and avoid unnecessary snacks (to snack only to correct hypoglycemia).  - I have approached her with the following individualized plan to manage her diabetes and patient agrees:   -  She can stay off her Lantus  for now.  -she can take a break from routine glucose monitoring at this time due to not being on medications.  - she is warned not to take insulin  without proper monitoring per orders. - Adjustment parameters are given to her for hypo and hyperglycemia in writing.  - her Parlodel  and Farxiga  was discontinued previously, due to side effect profile and to deescalate her treatment plan. She was also taken off Metformin  and Mounjaro  due to lack of appetite r/t chemotherapy.  - Specific targets for  A1c; LDL, HDL, and Triglycerides were discussed with the patient.  2) Blood Pressure /Hypertension:  her blood pressure is controlled to target.  She is advised to continue medications prescribed by her PCP.  3) Lipids/Hyperlipidemia:    Her recent lipid panel from 10/29/22 shows controlled LDL of 64.  she is advised to continue Lipitor 20 mg daily at bedtime.  Side effects and precautions discussed with her.   4)  Weight/Diet:  her Body mass index is 22.46 kg/m.  -  clearly complicating her diabetes care.   she is a candidate for weight loss. I discussed with her the fact that loss of 5 - 10% of her  current body weight will have the most impact on her diabetes management.  Exercise, and detailed carbohydrates information provided  -  detailed on discharge  instructions.  5)Vitamin D  deficiency Her recent vitamin D  level on 10/29/22 was 19.5.  I discussed and initiated Ergocalciferol  50000 units po weekly x 12 weeks, once finished she can switch over to OTC Vitamin D3 5000 units daily as maintenance dose.    6) Chronic Care/Health Maintenance: -she is on ACEI/ARB and Statin medications and is encouraged to initiate and continue to follow up with Ophthalmology, Dentist, Podiatrist at least yearly or according to recommendations, and advised to stay away from smoking. I have recommended yearly flu vaccine and pneumonia vaccine at least every 5 years; moderate intensity exercise for up to 150 minutes weekly; and sleep for at least 7 hours a day.  - she is advised to maintain close follow up with Regino Slater, MD for primary care needs, as well as her other providers for optimal and coordinated care.    I spent  36  minutes in the care of the patient today including review of labs from CMP, Lipids, Thyroid  Function, Hematology (current and previous including abstractions from other facilities); face-to-face time discussing  her blood glucose readings/logs, discussing hypoglycemia and hyperglycemia episodes and symptoms, medications doses, her options of short and long term treatment based on the latest standards of care / guidelines;  discussion about incorporating lifestyle medicine;  and documenting the encounter. Risk reduction counseling performed per USPSTF guidelines to reduce obesity and cardiovascular risk factors.     Please refer to Patient Instructions for Blood Glucose Monitoring and Insulin /Medications Dosing Guide  in media tab for additional information. Please  also refer to  Patient Self Inventory in the Media  tab for reviewed elements of pertinent patient history.  Lonza JAYSON Sensor participated in the discussions, expressed understanding, and voiced agreement with the above plans.  All questions were answered to her satisfaction. she  is encouraged to contact clinic should she have any questions or concerns prior to her return visit.     Follow up plan: - Return in about 6 months (around 08/26/2024) for Diabetes F/U with A1c in office, No previsit labs.  Benton Rio, FNP-BC [facility] [address]  [address] [place], [place] [address] Phone: [phone] Fax: [phone]  02/24/2024, 10:59 AM

## 2024-02-27 ENCOUNTER — Ambulatory Visit
Admission: EM | Admit: 2024-02-27 | Discharge: 2024-02-27 | Disposition: A | Discharge status: Home / Self Care | Attending: Family Medicine | Admitting: Family Medicine

## 2024-02-27 DIAGNOSIS — Z7985 Long-term (current) use of injectable non-insulin antidiabetic drugs: Secondary | ICD-10-CM

## 2024-02-27 DIAGNOSIS — E119 Type 2 diabetes mellitus without complications: Secondary | ICD-10-CM | POA: Diagnosis not present

## 2024-02-27 DIAGNOSIS — L239 Allergic contact dermatitis, unspecified cause: Secondary | ICD-10-CM | POA: Diagnosis not present

## 2024-02-27 MED ORDER — METHYLPREDNISOLONE ACETATE 40 MG/ML IJ SUSP
40.0000 mg | Freq: Once | INTRAMUSCULAR | Status: AC
  Administered 2024-02-27: 40 mg via INTRAMUSCULAR

## 2024-02-27 MED ORDER — TRIAMCINOLONE ACETONIDE 0.1 % EX CREA
1.0000 | TOPICAL_CREAM | Freq: Two times a day (BID) | CUTANEOUS | 0 refills | Status: DC
Start: 2024-02-27 — End: 2024-04-06

## 2024-02-27 NOTE — ED Triage Notes (Signed)
 Pt reports rash that started Sunday even and has gotten worse over the course of the week. Rash is on left leg, head, back, shoulder, and chest.

## 2024-02-27 NOTE — ED Provider Notes (Signed)
 [facility]    CSN: [id number] Arrival date & time: 02/27/24  9163      History   Chief Complaint No chief complaint on file.   HPI [name] is a 68 y.o. female.   Patient presenting today with itchy rash progressively worsening over the past 4 to 5 days.  Rashes on bilateral legs, back, shoulder, chest.  Denies new medications or supplements, new foods, new outdoor exposures that she is aware of.  Denies throat itching or swelling, chest tightness, shortness of breath, abdominal pain, nausea vomiting or diarrhea.  Trying over-the-counter remedies with minimal relief.    Past Medical History:  Diagnosis Date   Adenocarcinoma of right lung, stage 1 (HCC) 11/29/2016   Chronic pain    Depression    takes Lexapro  daily   Diabetes mellitus    takes Metformin  daily and has an insulin  pump.Fasting blood sugar runs250   GERD (gastroesophageal reflux disease)    takes Pantoprazole  daily   History of colon polyps    benign   Hyperlipidemia    takes Atorvastatin  daily   Hypertension    takes Amlodipine  and Lisinopril  daily   Insomnia    takes Trazodone  nightly   Nocturia    recurrent lung ca with mets to brain, LN, bone and adrenal gland 06/2023   Thyroid  disease    Urinary frequency    Urinary urgency     Patient Active Problem List   Diagnosis Date Noted   Anemia 12/12/2023   Syncope 11/13/2023   Pancytopenia (HCC) 11/13/2023   Thrombocytopenia (HCC) 11/13/2023   Type 2 diabetes mellitus with hyperglycemia (HCC) 11/13/2023   Encounter for antineoplastic immunotherapy 09/11/2023   Encounter for antineoplastic chemotherapy 09/11/2023   Port-A-Cath in place 08/21/2023   Metastatic cancer to brain (HCC) 07/23/2023   GERD (gastroesophageal reflux disease) 12/04/2022   Abnormal CT scan, esophagus 12/04/2022   Chronic pain 01/02/2021   Decreased estrogen level 01/02/2021   Exostosis of skull 01/02/2021   Insomnia 01/02/2021   Long term (current) use  of insulin  (HCC) 01/02/2021   Moderate recurrent major depression (HCC) 01/02/2021   Pure hypercholesterolemia 01/02/2021   Ventricular premature beats 01/02/2021   Nausea without vomiting 01/06/2020   Abdominal pain 01/06/2020   Obesity 07/15/2019   Diarrhea    History of colonic polyps 03/09/2019   Microcytic anemia 03/09/2019   Dysphagia 03/09/2019   Stage IV adenocarcinoma of lung, right (HCC) 11/29/2016   S/P lobectomy of lung 10/15/2016   Mixed hyperlipidemia 10/02/2016   Lung nodule 10/02/2016   Adrenal adenoma, right 10/02/2016   Diabetes (HCC) 06/16/2016   Essential hypertension 05/31/2016   Increased frequency of urination 07/16/2014   Change in bowel habits 03/03/2014   Depression 10/13/2013   Fractured lateral malleolus 08/12/2012   Left knee sprain 08/12/2012    Past Surgical History:  Procedure Laterality Date   ABDOMINAL HYSTERECTOMY     ABDOMINAL SURGERY     BIOPSY  05/28/2019   Procedure: BIOPSY;  Surgeon: Harvey Margo CROME, MD;  Location: [facility];  Service: Endoscopy;;  random colon/gastric/duodenum   BIOPSY  01/11/2023   Procedure: BIOPSY;  Surgeon: Cindie Carlin POUR, DO;  Location: [facility];  Service: Endoscopy;;   COLONOSCOPY  2005 MAC   TICs, IH   COLONOSCOPY N/A 03/08/2014   Procedure: COLONOSCOPY;  Surgeon: Margo CROME Harvey, MD;  Location: [facility];  Service: Endoscopy;  Laterality: N/A;  1015   COLONOSCOPY WITH PROPOFOL  N/A 05/28/2019  Procedure: COLONOSCOPY WITH PROPOFOL ;  Surgeon: Harvey Margo CROME, MD;  Location: [facility];  Service: Endoscopy;  Laterality: N/A;  9:15am   CYSTOSTOMY  11/22/2011   Procedure: CYSTOSTOMY SUPRAPUBIC;  Surgeon: Emery LILLETTE Blaze, MD;  Location: [facility];  Service: Urology;  Laterality: N/A;   ESOPHAGOGASTRODUODENOSCOPY N/A 03/08/2014   Procedure: ESOPHAGOGASTRODUODENOSCOPY (EGD);  Surgeon: Margo CROME Harvey, MD;  [facility]: [facility];  Service: Endoscopy;  Laterality: N/A;   ESOPHAGOGASTRODUODENOSCOPY      ESOPHAGOGASTRODUODENOSCOPY (EGD) WITH PROPOFOL  N/A 05/28/2019   Procedure: ESOPHAGOGASTRODUODENOSCOPY (EGD) WITH PROPOFOL ;  Surgeon: Harvey Margo CROME, MD;  [facility]: [facility];  Service: Endoscopy;  Laterality: N/A;   ESOPHAGOGASTRODUODENOSCOPY (EGD) WITH PROPOFOL  N/A 01/11/2023   Procedure: ESOPHAGOGASTRODUODENOSCOPY (EGD) WITH PROPOFOL ;  Surgeon: Cindie Carlin POUR, DO;  [facility]: [facility];  Service: Endoscopy;  Laterality: N/A;  9:45 am, asa 3   GIVENS CAPSULE STUDY N/A 06/15/2019   Procedure: GIVENS CAPSULE STUDY;  Surgeon: Harvey Margo CROME, MD;  [facility]: [facility];  Service: Endoscopy;  Laterality: N/A;  7:30am   HALLUX VALGUS CORRECTION     bilateral foot surgery for bone repairs-multiple   IR IMAGING GUIDED PORT INSERTION  08/19/2023   LOBECTOMY Right 10/15/2016   Procedure: RIGHT UPPER LOBECTOMY;  Surgeon: Elspeth JAYSON Millers, MD;  Location: [facility] OR;  Service: Thoracic;  Laterality: Right;   LYMPH NODE DISSECTION Right 10/15/2016   Procedure: LYMPH NODE DISSECTION;  Surgeon: Elspeth JAYSON Millers, MD;  [facility];  Service: Thoracic;  Laterality: Right;   POLYPECTOMY  05/28/2019   Procedure: POLYPECTOMY;  Surgeon: Harvey Margo CROME, MD;  [facility];  Service: Endoscopy;;   SAVORY DILATION N/A 05/28/2019   Procedure: SAVORY DILATION;  Surgeon: Harvey Margo CROME, MD;  Location: [facility];  Service: Endoscopy;  Laterality: N/A;  15/16/17   VAGINA RECONSTRUCTION SURGERY     tvt 2006- [place]    VIDEO ASSISTED THORACOSCOPY (VATS)/WEDGE RESECTION Right 10/15/2016   Procedure: RIGHT VIDEO ASSISTED THORACOSCOPY (VATS)/WEDGE RESECTION;  Surgeon: Elspeth JAYSON Millers, MD;  Location: [facility];  Service: Thoracic;  Laterality: Right;    OB History     Gravida      Para      Term      Preterm      AB      Living  0      SAB      IAB      Ectopic      Multiple      Live Births               Home Medications    Prior to Admission medications    Medication Sig Start Date End Date Taking? Authorizing Provider  triamcinolone  cream (KENALOG ) 0.1 % Apply 1 Application topically 2 (two) times daily. Avoid use on face or private areas 02/27/24  Yes Stuart Vernell Norris, PA-C  ACCU-CHEK AVIVA PLUS test strip USE TO MONITOR GLUCOSE LEVELS TWICE A DAY E11.9 11/27/21   Kassie Mallick, MD  ACCU-CHEK SOFTCLIX LANCETS lancets Use to monitor glucose levels BID; E11.9 08/21/18   Kassie Mallick, MD  acetaminophen  (TYLENOL ) 500 MG tablet Take 1 tablet (500 mg total) by mouth every 6 (six) hours as needed. 08/07/17   Horton, Charmaine FALCON, MD  adagrasib  (KRAZATI ) 200 MG tablet Take 3 tablets (600 mg total) by mouth 2 (two) times daily. 01/13/24   Sherrod Sherrod, MD  atorvastatin  (LIPITOR) 20 MG tablet Take 20 mg by mouth  daily. 04/27/17   [provider]  Blood Glucose Calibration (GLUCOSE CONTROL) SOLN 1 Bottle by In Vitro route as needed. Use to calibrate Accu-Chek Aviva Plus device prn; please provide control solution compatible with Accu-Chek Aviva Plus device. 08/21/18   Kassie Mallick, MD  Blood Glucose Monitoring Suppl (ACCU-CHEK AVIVA PLUS) w/Device KIT 1 each by Does not apply route 2 (two) times daily. Use to monitor glucose levels BID; E11.9 Patient taking differently: 1 each by Does not apply route 2 (two) times daily. E11.9 08/19/18   Kassie Mallick, MD  clotrimazole  (MYCELEX ) 10 MG troche Take 10 mg by mouth 5 (five) times daily. 11/06/23   [provider]  escitalopram  (LEXAPRO ) 10 MG tablet Take 1 tablet (10 mg total) by mouth every morning. 10/25/23   Okey Barnie SAUNDERS, MD  ferrous sulfate  325 (65 FE) MG tablet Take 1 tablet (325 mg total) by mouth 2 (two) times daily with a meal. 11/15/23 02/24/24  Shahmehdi, Adriana LABOR, MD  lidocaine  (XYLOCAINE ) 2 % solution every 6 (six) hours as needed for mouth pain. Apply topically to mouth for sores 11/06/23   [provider]  loperamide (IMODIUM) 2 MG capsule Take 2 mg by mouth as needed for  diarrhea or loose stools. 01/13/24   [provider]  magic mouthwash SOLN Take 5 mLs by mouth 3 (three) times daily as needed for mouth pain. Suspension contains equal amounts of Maalox Extra Strength, nystatin, and diphenhydramine .    [provider]  pantoprazole  (PROTONIX ) 40 MG tablet TAKE 1 TABLET BY MOUTH TWICE A DAY 30 MINS BEFORE YOUR FIRST AND LAST MEAL. 11/20/23   Ezzard Sonny RAMAN, PA-C  traZODone  (DESYREL ) 100 MG tablet Take 2 tablets (200 mg total) by mouth at bedtime. 10/25/23   Okey Barnie SAUNDERS, MD    Family History Family History  Problem Relation Age of Onset   Asthma Other    Diabetes Other    Arthritis Other    Cancer Mother    Cancer Brother    Anesthesia problems Neg Hx    Hypotension Neg Hx    Malignant hyperthermia Neg Hx    Pseudochol deficiency Neg Hx    Colon polyps Neg Hx    Colon cancer Neg Hx    Celiac disease Neg Hx    Pancreatic cancer Neg Hx    Stomach cancer Neg Hx    Ulcerative colitis Neg Hx    Crohn's disease Neg Hx     Social History Social History   Tobacco Use   Smoking status: Former    Current packs/day: 1.00    Average packs/day: 1 pack/day for 32.0 years (32.0 ttl pk-yrs)    Types: Cigarettes    Passive exposure: Never   Smokeless tobacco: Never   Tobacco comments:    quit smoking 20 yrs ago  Vaping Use   Vaping status: Never Used  Substance Use Topics   Alcohol use: No    Comment: rarely   Drug use: No     Allergies   Synthroid [levothyroxine sodium], Voltaren  [diclofenac  sodium], Hydrochlorothiazide, and Iodine   Review of Systems Review of Systems Per HPI  Physical Exam Triage Vital Signs ED Triage Vitals  Encounter Vitals Group     BP 02/27/24 0925 99/63     Girls Systolic BP Percentile --      Girls Diastolic BP Percentile --      Boys Systolic BP Percentile --      Boys Diastolic BP Percentile --  Pulse Rate 02/27/24 0925 79     Resp 02/27/24 0925 18     Temp 02/27/24 0925 98.5 F  (36.9 C)     Temp Source 02/27/24 0925 Oral     SpO2 02/27/24 0925 99 %     Weight --      Height --      Head Circumference --      Peak Flow --      Pain Score 02/27/24 0928 0     Pain Loc --      Pain Education --      Exclude from Growth Chart --    No data found.  Updated Vital Signs BP 99/63 (BP Location: Right Arm)   Pulse 79   Temp 98.5 F (36.9 C) (Oral)   Resp 18   SpO2 99%   Visual Acuity Right Eye Distance:   Left Eye Distance:   Bilateral Distance:    Right Eye Near:   Left Eye Near:    Bilateral Near:     Physical Exam Vitals and nursing note reviewed.  Constitutional:      Appearance: Normal appearance. She is not ill-appearing.  HENT:     Head: Atraumatic.     Mouth/Throat:     Mouth: Mucous membranes are moist.  Eyes:     Extraocular Movements: Extraocular movements intact.     Conjunctiva/sclera: Conjunctivae normal.  Cardiovascular:     Rate and Rhythm: Normal rate.     Heart sounds: Normal heart sounds.  Pulmonary:     Effort: Pulmonary effort is normal.     Breath sounds: Normal breath sounds.  Musculoskeletal:        General: Normal range of motion.     Cervical back: Normal range of motion and neck supple.  Skin:    General: Skin is warm and dry.     Findings: Rash present.     Comments: Erythematous maculopapular rash across extremities, chest, back  Neurological:     Mental Status: She is alert and oriented to person, place, and time.  Psychiatric:        Mood and Affect: Mood normal.        Thought Content: Thought content normal.        Judgment: Judgment normal.      UC Treatments / Results  Labs (all labs ordered are listed, but only abnormal results are displayed) Labs Reviewed - No data to display  EKG   Radiology No results found.  Procedures Procedures (including critical care time)  Medications Ordered in UC Medications  methylPREDNISolone  acetate (DEPO-MEDROL ) injection 40 mg (40 mg Intramuscular  Given 02/27/24 1025)    Initial Impression / Assessment and Plan / UC Course  I have reviewed the triage vital signs and the nursing notes.  Pertinent labs & imaging results that were available during my care of the patient were reviewed by me and considered in my medical decision making (see chart for details).     Allergic dermatitis, unclear trigger.  Treat with Depo-Medrol  IM, triamcinolone  cream, antihistamines, supportive over-the-counter medications and home care.  Discussed to monitor blood sugars while on the injection for the next week or so.  Blood sugars currently well-controlled off insulin  with A1c of 5.8 at recent endocrinology visit.  Final Clinical Impressions(s) / UC Diagnoses   Final diagnoses:  Allergic dermatitis  Type 2 diabetes mellitus without complication, without long-term current use of insulin  [facility])   Discharge Instructions   None  ED Prescriptions     Medication Sig Dispense Auth. Provider   triamcinolone  cream (KENALOG ) 0.1 % Apply 1 Application topically 2 (two) times daily. Avoid use on face or private areas 80 g Stuart Vernell Norris, [place]      PDMP not reviewed this encounter.   Stuart Vernell Norris, [place] 02/27/24 1104

## 2024-03-02 ENCOUNTER — Telehealth: Payer: Self-pay | Admitting: Medical Oncology

## 2024-03-02 DIAGNOSIS — R21 Rash and other nonspecific skin eruption: Secondary | ICD-10-CM

## 2024-03-02 MED ORDER — METHYLPREDNISOLONE 4 MG PO TBPK: ORAL_TABLET | ORAL | 0 refills | Status: DC

## 2024-03-02 NOTE — Telephone Encounter (Signed)
 Per Dr. Sherrod , I told pt that he prescribed a Medrol  dose pak and Benadryl  cream . Try these for 3 days and report back.  I also told her to call us  back in the interim if symptoms worsen. She voiced understanding.

## 2024-03-02 NOTE — Telephone Encounter (Signed)
 I started itching all over my whole body a week ago, and then a rash appeared . States she went to Urgent Care on Thursday and was prescribed Triamcinolone  cream for contact dermatitis.   Reports using an old starleen to scratch herself due to persistent itching.   Adagrasib  was started ~June 26.   Pt instructed to: Continue Triamcinolone  cream as prescribed.  Avoid use of shared or unclean items (e.g., old hairbrush) on skin.  Encourage gentle skin care and avoid further skin trauma.  Monitor for signs of infection or worsening symptoms.  Notify provider for further evaluation.

## 2024-03-02 NOTE — Telephone Encounter (Signed)
 err

## 2024-03-03 ENCOUNTER — Other Ambulatory Visit: Payer: Self-pay

## 2024-03-04 ENCOUNTER — Other Ambulatory Visit

## 2024-03-04 ENCOUNTER — Ambulatory Visit

## 2024-03-04 ENCOUNTER — Ambulatory Visit: Admitting: Internal Medicine

## 2024-03-06 ENCOUNTER — Other Ambulatory Visit: Payer: Self-pay

## 2024-03-09 ENCOUNTER — Other Ambulatory Visit: Payer: Self-pay

## 2024-03-10 ENCOUNTER — Ambulatory Visit (HOSPITAL_COMMUNITY)
Admission: RE | Admit: 2024-03-10 | Discharge: 2024-03-10 | Disposition: A | Discharge status: Home / Self Care | Source: Ambulatory Visit | Attending: Internal Medicine | Admitting: Internal Medicine

## 2024-03-10 ENCOUNTER — Other Ambulatory Visit: Payer: Self-pay | Admitting: Internal Medicine

## 2024-03-10 DIAGNOSIS — C3490 Malignant neoplasm of unspecified part of unspecified bronchus or lung: Secondary | ICD-10-CM | POA: Insufficient documentation

## 2024-03-12 ENCOUNTER — Ambulatory Visit (HOSPITAL_COMMUNITY)
Admission: RE | Admit: 2024-03-12 | Discharge: 2024-03-12 | Disposition: A | Discharge status: Home / Self Care | Source: Ambulatory Visit | Attending: Radiation Oncology | Admitting: Radiation Oncology

## 2024-03-12 ENCOUNTER — Inpatient Hospital Stay: Attending: Internal Medicine

## 2024-03-12 DIAGNOSIS — C3490 Malignant neoplasm of unspecified part of unspecified bronchus or lung: Secondary | ICD-10-CM

## 2024-03-12 DIAGNOSIS — Z902 Acquired absence of lung [part of]: Secondary | ICD-10-CM | POA: Insufficient documentation

## 2024-03-12 DIAGNOSIS — R634 Abnormal weight loss: Secondary | ICD-10-CM | POA: Diagnosis not present

## 2024-03-12 DIAGNOSIS — C3411 Malignant neoplasm of upper lobe, right bronchus or lung: Secondary | ICD-10-CM | POA: Insufficient documentation

## 2024-03-12 DIAGNOSIS — C3491 Malignant neoplasm of unspecified part of right bronchus or lung: Secondary | ICD-10-CM

## 2024-03-12 DIAGNOSIS — C778 Secondary and unspecified malignant neoplasm of lymph nodes of multiple regions: Secondary | ICD-10-CM | POA: Diagnosis not present

## 2024-03-12 DIAGNOSIS — C7971 Secondary malignant neoplasm of right adrenal gland: Secondary | ICD-10-CM | POA: Insufficient documentation

## 2024-03-12 DIAGNOSIS — R112 Nausea with vomiting, unspecified: Secondary | ICD-10-CM | POA: Diagnosis not present

## 2024-03-12 DIAGNOSIS — C7951 Secondary malignant neoplasm of bone: Secondary | ICD-10-CM | POA: Insufficient documentation

## 2024-03-12 DIAGNOSIS — C7972 Secondary malignant neoplasm of left adrenal gland: Secondary | ICD-10-CM | POA: Diagnosis not present

## 2024-03-12 DIAGNOSIS — C7931 Secondary malignant neoplasm of brain: Secondary | ICD-10-CM | POA: Insufficient documentation

## 2024-03-12 DIAGNOSIS — R530 Neoplastic (malignant) related fatigue: Secondary | ICD-10-CM | POA: Insufficient documentation

## 2024-03-12 DIAGNOSIS — Z95828 Presence of other vascular implants and grafts: Secondary | ICD-10-CM

## 2024-03-12 LAB — CMP (CANCER CENTER ONLY)
ALT: 26 U/L (ref 0–44)
AST: 42 U/L — ABNORMAL HIGH (ref 15–41)
Albumin: 2.6 g/dL — ABNORMAL LOW (ref 3.5–5.0)
Alkaline Phosphatase: 80 U/L (ref 38–126)
Anion gap: 11 (ref 5–15)
BUN: 28 mg/dL — ABNORMAL HIGH (ref 8–23)
CO2: 22 mmol/L (ref 22–32)
Calcium: 8.2 mg/dL — ABNORMAL LOW (ref 8.9–10.3)
Chloride: 98 mmol/L (ref 98–111)
Creatinine: 1.64 mg/dL — ABNORMAL HIGH (ref 0.44–1.00)
GFR, Estimated: 33 mL/min — ABNORMAL LOW (ref >60)
Glucose, Bld: 212 mg/dL — ABNORMAL HIGH (ref 70–99)
Potassium: 4.5 mmol/L (ref 3.5–5.1)
Sodium: 131 mmol/L — ABNORMAL LOW (ref 135–145)
Total Bilirubin: 0.6 mg/dL (ref 0.0–1.2)
Total Protein: 5.5 g/dL — ABNORMAL LOW (ref 6.5–8.1)

## 2024-03-12 LAB — CBC WITH DIFFERENTIAL (CANCER CENTER ONLY)
Abs Immature Granulocytes: 0.08 K/uL — ABNORMAL HIGH (ref 0.00–0.07)
Basophils Absolute: 0 K/uL (ref 0.0–0.1)
Basophils Relative: 0 %
Eosinophils Absolute: 0 K/uL (ref 0.0–0.5)
Eosinophils Relative: 1 %
HCT: 24.2 % — ABNORMAL LOW (ref 36.0–46.0)
Hemoglobin: 8.7 g/dL — ABNORMAL LOW (ref 12.0–15.0)
Immature Granulocytes: 2 %
Lymphocytes Relative: 16 %
Lymphs Abs: 0.7 K/uL (ref 0.7–4.0)
MCH: 30.5 pg (ref 26.0–34.0)
MCHC: 36 g/dL (ref 30.0–36.0)
MCV: 84.9 fL (ref 80.0–100.0)
Monocytes Absolute: 0.5 K/uL (ref 0.1–1.0)
Monocytes Relative: 10 %
Neutro Abs: 3.1 K/uL (ref 1.7–7.7)
Neutrophils Relative %: 71 %
Platelet Count: 99 K/uL — ABNORMAL LOW (ref 150–400)
RBC: 2.85 MIL/uL — ABNORMAL LOW (ref 3.87–5.11)
RDW: 13.2 % (ref 11.5–15.5)
WBC Count: 4.4 K/uL (ref 4.0–10.5)
nRBC: 0 % (ref 0.0–0.2)

## 2024-03-12 LAB — SAMPLE TO BLOOD BANK

## 2024-03-12 MED ORDER — SODIUM CHLORIDE 0.9% FLUSH
10.0000 mL | Freq: Once | INTRAVENOUS | Status: AC
Start: 2024-03-12 — End: 2024-03-12
  Administered 2024-03-12: 10 mL

## 2024-03-12 MED ORDER — GADOBUTROL 1 MMOL/ML IV SOLN
7.0000 mL | Freq: Once | INTRAVENOUS | Status: AC | PRN
  Administered 2024-03-12: 7 mL via INTRAVENOUS

## 2024-03-13 ENCOUNTER — Inpatient Hospital Stay

## 2024-03-13 DIAGNOSIS — Z95828 Presence of other vascular implants and grafts: Secondary | ICD-10-CM

## 2024-03-13 MED ORDER — SODIUM CHLORIDE 0.9% FLUSH
10.0000 mL | Freq: Once | INTRAVENOUS | Status: AC
Start: 2024-03-13 — End: 2024-03-13
  Administered 2024-03-13: 10 mL

## 2024-03-16 ENCOUNTER — Encounter: Payer: Self-pay | Admitting: Radiation Oncology

## 2024-03-16 ENCOUNTER — Encounter: Payer: Self-pay | Admitting: Physician Assistant

## 2024-03-16 ENCOUNTER — Telehealth: Payer: Self-pay | Admitting: Medical Oncology

## 2024-03-16 NOTE — Telephone Encounter (Signed)
 I need to get muscle training. My legs are weak.   I told her to discuss this with Dr. Sherrod at her appointment on Wednesday because she may need physical therapy . They take referrals for PT at  the [facility] in [place].  LVM at [facility] to return my call.

## 2024-03-17 ENCOUNTER — Other Ambulatory Visit (HOSPITAL_COMMUNITY)

## 2024-03-18 ENCOUNTER — Inpatient Hospital Stay (HOSPITAL_BASED_OUTPATIENT_CLINIC_OR_DEPARTMENT_OTHER): Admitting: Internal Medicine

## 2024-03-18 VITALS — BP 100/65 | HR 64 | Temp 97.6°F | Resp 16 | Ht 67.0 in | Wt 135.0 lb

## 2024-03-18 DIAGNOSIS — C3491 Malignant neoplasm of unspecified part of right bronchus or lung: Secondary | ICD-10-CM

## 2024-03-18 DIAGNOSIS — N179 Acute kidney failure, unspecified: Secondary | ICD-10-CM | POA: Diagnosis not present

## 2024-03-18 DIAGNOSIS — R531 Weakness: Secondary | ICD-10-CM | POA: Diagnosis not present

## 2024-03-18 NOTE — Progress Notes (Signed)
 [facility] [phone]   [phone]  OFFICE PROGRESS NOTE  Molly Slater, MD [address] [name] [address]  DIAGNOSIS:  1) recurrent/metastatic non-small cell lung cancer, adenocarcinoma presented with metastatic adenopathy in the thorax as well as mediastinum, axillary lymph nodes and potential local recurrence at the right hilum along the inferior margin of the resection staple line with bilateral hypermetabolic adrenal metastasis and hypermetabolic bone metastasis to the T11 vertebral body as well as brain metastasis diagnosed in December 2024. 2) incidental bilaterally pulmonary embolism diagnosed on CT scan of the chest on October 22, 2023 2) Stage IA (T1a, N0, M0) non-small cell lung cancer, adenocarcinoma presented with right upper lobe pulmonary nodule   Detected Alteration(s) / Biomarker(s) Associated FDA-approved therapies Clinical Trial Availability % cfDNA or Amplification KRAS G12C approved by FDA Adagrasib , Sotorasib approved in other indication Adagrasib +cetuximab Yes 2.8%  TP53 E204* None Yes 2.0%   PRIOR THERAPY:  1) S/P right VATS with right upper lobectomy and mediastinal lymph node dissection under the care of Dr. Kerrin on October 15, 2016. 2) whole brain irradiation under the care of of Dr. Izell. 3) Systemic chemotherapy with carboplatin  for AUC of 5, Alimta  500 Mg/M2 and Keytruda  200 Mg IV every 3 weeks.  First dose August 20, 2022.  Status post 7   cycles.  Starting from cycle #5 she has been on maintenance treatment with Alimta  and Keytruda  every 3 weeks for 3 more cycles.  Last dose was given on December 29, 2023 discontinued secondary to disease progression CURRENT THERAPY:  1) Krazati  (Adagrasib ) 600 mg p.o. twice daily started on 01/23/2024. 2) Eliquis  starter kit with 10 mg p.o. twice daily for 7 days followed by 5 mg p.o. daily.  First dose started October 18, 2023.   INTERVAL HISTORY: [name] 68 y.o. female returns to the clinic today for annual follow-up visit accompanied by a neighbor.Discussed the use of AI scribe software for clinical note transcription with the patient, who gave verbal consent to proceed.  History of Present Illness [name] is a 68 year old female with metastatic cancer who presents for evaluation with repeat MRI and CT scans for restaging of her disease.  She is currently undergoing treatment for metastatic cancer. Previously, she was on maintenance treatment with Alimta  and Keytruda , which was discontinued on December 29, 2023, due to disease progression. She started treatment with Krazati  (adagrasib ) 600 mg twice daily on January 23, 2024.  She experiences significant weakness, stating 'I've got no energy. I can't even pick up my legs.' This morning, she was unable to get up from the kitchen floor for over two hours until found by her friend.  She experiences nausea and vomiting, stating she 'threw up on the way here.' She has not been taking Zofran  to manage nausea. She has lost ten pounds, which she attributes to a poor appetite. She uses [employer] for food delivery but faces difficulty accessing her kitchen due to mobility issues, as it requires navigating three steps.  Her current medication regimen includes Krazati  600 mg twice daily.     MEDICAL HISTORY: Past Medical History:  Diagnosis Date   Adenocarcinoma of right lung, stage 1 (HCC) 11/29/2016   Chronic pain    Depression    takes Lexapro  daily   Diabetes mellitus    takes Metformin  daily and has an insulin  pump.Fasting blood sugar runs250   GERD (gastroesophageal reflux disease)  takes Pantoprazole  daily   History of colon polyps    benign   Hyperlipidemia    takes Atorvastatin  daily   Hypertension    takes Amlodipine  and Lisinopril  daily   Insomnia    takes Trazodone  nightly   Nocturia    recurrent lung ca with mets to brain, LN, bone and adrenal gland 06/2023   Thyroid   disease    Urinary frequency    Urinary urgency     ALLERGIES:  is allergic to synthroid [levothyroxine sodium], voltaren  [diclofenac  sodium], hydrochlorothiazide, and iodine.  MEDICATIONS:  Current Outpatient Medications  Medication Sig Dispense Refill   ACCU-CHEK AVIVA PLUS test strip USE TO MONITOR GLUCOSE LEVELS TWICE A DAY E11.9 100 strip 12   ACCU-CHEK SOFTCLIX LANCETS lancets Use to monitor glucose levels BID; E11.9 100 each 12   acetaminophen  (TYLENOL ) 500 MG tablet Take 1 tablet (500 mg total) by mouth every 6 (six) hours as needed. 30 tablet 0   adagrasib  (KRAZATI ) 200 MG tablet Take 3 tablets (600 mg total) by mouth 2 (two) times daily. 180 tablet 3   atorvastatin  (LIPITOR) 20 MG tablet Take 20 mg by mouth daily.  11   Blood Glucose Calibration (GLUCOSE CONTROL) SOLN 1 Bottle by In Vitro route as needed. Use to calibrate Accu-Chek Aviva Plus device prn; please provide control solution compatible with Accu-Chek Aviva Plus device. 1 each 1   Blood Glucose Monitoring Suppl (ACCU-CHEK AVIVA PLUS) w/Device KIT 1 each by Does not apply route 2 (two) times daily. Use to monitor glucose levels BID; E11.9 (Patient taking differently: 1 each by Does not apply route 2 (two) times daily. E11.9) 1 kit 0   clotrimazole  (MYCELEX ) 10 MG troche Take 10 mg by mouth 5 (five) times daily.     escitalopram  (LEXAPRO ) 10 MG tablet Take 1 tablet (10 mg total) by mouth every morning. 90 tablet 3   ferrous sulfate  325 (65 FE) MG tablet Take 1 tablet (325 mg total) by mouth 2 (two) times daily with a meal. 60 tablet 0   lidocaine  (XYLOCAINE ) 2 % solution every 6 (six) hours as needed for mouth pain. Apply topically to mouth for sores     loperamide  (IMODIUM ) 2 MG capsule Take 2 mg by mouth as needed for diarrhea or loose stools.     magic mouthwash SOLN Take 5 mLs by mouth 3 (three) times daily as needed for mouth pain. Suspension contains equal amounts of Maalox Extra Strength, nystatin, and diphenhydramine .      methylPREDNISolone  (MEDROL  DOSEPAK) 4 MG TBPK tablet Take as prescribed on the package instructions. 21 tablet 0   pantoprazole  (PROTONIX ) 40 MG tablet TAKE 1 TABLET BY MOUTH TWICE A DAY 30 MINS BEFORE YOUR FIRST AND LAST MEAL. 180 tablet 1   traZODone  (DESYREL ) 100 MG tablet Take 2 tablets (200 mg total) by mouth at bedtime. 180 tablet 3   triamcinolone  cream (KENALOG ) 0.1 % Apply 1 Application topically 2 (two) times daily. Avoid use on face or private areas 80 g 0   No current facility-administered medications for this visit.    SURGICAL HISTORY:  Past Surgical History:  Procedure Laterality Date   ABDOMINAL HYSTERECTOMY     ABDOMINAL SURGERY     BIOPSY  05/28/2019   Procedure: BIOPSY;  Surgeon: Harvey Margo CROME, MD;  [facility];  Service: Endoscopy;;  random colon/gastric/duodenum   BIOPSY  01/11/2023   Procedure: BIOPSY;  Surgeon: Cindie Carlin POUR, DO;  [facility]: [facility];  Service:  Endoscopy;;   COLONOSCOPY  2005 MAC   TICs, IH   COLONOSCOPY N/A 03/08/2014   Procedure: COLONOSCOPY;  Surgeon: Margo LITTIE Haddock, MD;  [facility]: [facility];  Service: Endoscopy;  Laterality: N/A;  1015   COLONOSCOPY WITH PROPOFOL  N/A 05/28/2019   Procedure: COLONOSCOPY WITH PROPOFOL ;  Surgeon: Haddock Margo LITTIE, MD;  [facility];  Service: Endoscopy;  Laterality: N/A;  9:15am   CYSTOSTOMY  11/22/2011   Procedure: CYSTOSTOMY SUPRAPUBIC;  Surgeon: Emery LILLETTE Blaze, MD;  Location: [facility];  Service: Urology;  Laterality: N/A;   ESOPHAGOGASTRODUODENOSCOPY N/A 03/08/2014   Procedure: ESOPHAGOGASTRODUODENOSCOPY (EGD);  Surgeon: Margo LITTIE Haddock, MD;  [facility];  Service: Endoscopy;  Laterality: N/A;   ESOPHAGOGASTRODUODENOSCOPY     ESOPHAGOGASTRODUODENOSCOPY (EGD) WITH PROPOFOL  N/A 05/28/2019   Procedure: ESOPHAGOGASTRODUODENOSCOPY (EGD) WITH PROPOFOL ;  Surgeon: Haddock Margo LITTIE, MD;  [facility];  Service: Endoscopy;  Laterality: N/A;    ESOPHAGOGASTRODUODENOSCOPY (EGD) WITH PROPOFOL  N/A 01/11/2023   Procedure: ESOPHAGOGASTRODUODENOSCOPY (EGD) WITH PROPOFOL ;  Surgeon: Cindie Carlin POUR, DO;  [facility];  Service: Endoscopy;  Laterality: N/A;  9:45 am, asa 3   GIVENS CAPSULE STUDY N/A 06/15/2019   Procedure: GIVENS CAPSULE STUDY;  Surgeon: Haddock Margo LITTIE, MD;  [facility];  Service: Endoscopy;  Laterality: N/A;  7:30am   HALLUX VALGUS CORRECTION     bilateral foot surgery for bone repairs-multiple   IR IMAGING GUIDED PORT INSERTION  08/19/2023   LOBECTOMY Right 10/15/2016   Procedure: RIGHT UPPER LOBECTOMY;  Surgeon: Elspeth JAYSON Millers, MD;  Location: [facility] OR;  Service: Thoracic;  Laterality: Right;   LYMPH NODE DISSECTION Right 10/15/2016   Procedure: LYMPH NODE DISSECTION;  Surgeon: Elspeth JAYSON Millers, MD;  Location: [facility];  Service: Thoracic;  Laterality: Right;   POLYPECTOMY  05/28/2019   Procedure: POLYPECTOMY;  Surgeon: Haddock Margo LITTIE, MD;  [facility];  Service: Endoscopy;;   SAVORY DILATION N/A 05/28/2019   Procedure: SAVORY DILATION;  Surgeon: Haddock Margo LITTIE, MD;  [facility];  Service: Endoscopy;  Laterality: N/A;  15/16/17   VAGINA RECONSTRUCTION SURGERY     tvt 2006- [place]    VIDEO ASSISTED THORACOSCOPY (VATS)/WEDGE RESECTION Right 10/15/2016   Procedure: RIGHT VIDEO ASSISTED THORACOSCOPY (VATS)/WEDGE RESECTION;  Surgeon: Elspeth JAYSON Millers, MD;  Location: [facility];  Service: Thoracic;  Laterality: Right;    REVIEW OF SYSTEMS:  Constitutional: positive for fatigue and weight loss Eyes: negative Ears, nose, mouth, throat, and face: negative Respiratory: negative Cardiovascular: negative Gastrointestinal: positive for diarrhea and nausea Genitourinary:negative Integument/breast: negative Hematologic/lymphatic: negative Musculoskeletal:positive for muscle weakness Neurological: positive for weakness Behavioral/Psych: negative Endocrine:  negative Allergic/Immunologic: negative   PHYSICAL EXAMINATION: General appearance: alert, cooperative, fatigued, and no distress Head: Normocephalic, without obvious abnormality, atraumatic Neck: no adenopathy, no JVD, supple, symmetrical, trachea midline, and thyroid  not enlarged, symmetric, no tenderness/mass/nodules Lymph nodes: Cervical, supraclavicular, and axillary nodes normal. Resp: clear to auscultation bilaterally Back: symmetric, no curvature. ROM normal. No CVA tenderness. Cardio: regular rate and rhythm, S1, S2 normal, no murmur, click, rub or gallop GI: soft, non-tender; bowel sounds normal; no masses,  no organomegaly Extremities: extremities normal, atraumatic, no cyanosis or edema Neurologic: Alert and oriented X 3, normal strength and tone. Normal symmetric reflexes. Normal coordination and gait  ECOG PERFORMANCE STATUS: 1 - Symptomatic but completely ambulatory  Blood pressure 100/65, pulse 64, temperature 97.6 F (36.4 C), temperature source Temporal, resp. rate 16, height 5' 7 (  1.702 m), weight 135 lb (61.2 kg), SpO2 100%.  LABORATORY DATA: Lab Results  Component Value Date   WBC 4.4 03/12/2024   HGB 8.7 (L) 03/12/2024   HCT 24.2 (L) 03/12/2024   MCV 84.9 03/12/2024   PLT 99 (L) 03/12/2024      Chemistry      Component Value Date/Time   NA 131 (L) 03/12/2024 1534   NA 142 10/29/2022 1158   NA 141 05/29/2017 0921   K 4.5 03/12/2024 1534   K 4.8 05/29/2017 0921   CL 98 03/12/2024 1534   CO2 22 03/12/2024 1534   CO2 28 05/29/2017 0921   BUN 28 (H) 03/12/2024 1534   BUN 21 10/29/2022 1158   BUN 13.5 05/29/2017 0921   CREATININE 1.64 (H) 03/12/2024 1534   CREATININE 0.8 05/29/2017 0921      Component Value Date/Time   CALCIUM  8.2 (L) 03/12/2024 1534   CALCIUM  9.5 05/29/2017 0921   ALKPHOS 80 03/12/2024 1534   ALKPHOS 104 05/29/2017 0921   AST 42 (H) 03/12/2024 1534   AST 17 05/29/2017 0921   ALT 26 03/12/2024 1534   ALT 20 05/29/2017 0921    BILITOT 0.6 03/12/2024 1534   BILITOT 0.40 05/29/2017 0921       RADIOGRAPHIC STUDIES: MR Brain W Wo Contrast Result Date: 03/17/2024 EXAM: MRI BRAIN WITH AND WITHOUT CONTRAST 03/12/2024 05:40:28 PM TECHNIQUE: Multiplanar multisequence MRI of the head/brain was performed with and without the administration of intravenous contrast. COMPARISON: MRI head 12/06/2023. CLINICAL HISTORY: Metastatic disease evaluation; 3 T SRS Protocol. Metastatic cancer to brain [facility]). FINDINGS: BRAIN AND VENTRICLES: Numerous enhancing intracranial lesions again noted throughout the cerebral hemispheres and cerebellum. The largest lesion in the anterior right frontal lobe now measures up to 8 mm, previously 10 mm (series 1100, image 230). There are at least 20 lesions noted which are annotated on the oblique axial T1 post series 1100. These lesions are overall slightly decreased in size. There are a few previously noted punctate lesions which are no longer visualized. For example, the previously noted left occipital lesion seen on series 601, image 214 and the right frontal lesion seen on series 601, image 115 of the 12/06/2023 study are not appreciated on the current study. No new intracranial lesions. No lesions demonstrating definite enlargement. Scattered foci of restricted diffusion are decreased in conspicuity compared to prior. No evidence of acute infarct. No acute intracranial hemorrhage. There are several lesions which demonstrate decreased surrounding T2/FLAIR hyperintensity. Similar periventricular white matter signal abnormality. ORBITS: No acute abnormality. SINUSES: No acute abnormality. BONES AND SOFT TISSUES: Normal bone marrow signal and enhancement. Bilateral mastoid effusions. No acute soft tissue abnormality. IMPRESSION: 1. Numerous enhancing intracranial lesions throughout the cerebral hemispheres and cerebellum, overall slightly decreased in size compared to prior study. No new lesions or definite enlargement. 2.  Largest lesion in the anterior right frontal lobe measures up to 8 mm, previously 10 mm. Electronically signed by: Donnice Mania MD 03/17/2024 09:44 PM EDT RP Workstation: [id number]   CT CHEST ABDOMEN PELVIS WO CONTRAST Result Date: 03/17/2024 CLINICAL DATA:  Non-small cell lung cancer, recurrence/metastatic restaging. * Tracking Code: BO * EXAM: CT CHEST, ABDOMEN AND PELVIS WITHOUT CONTRAST TECHNIQUE: Multidetector CT imaging of the chest, abdomen and pelvis was performed following the standard protocol without IV contrast. RADIATION DOSE REDUCTION: This exam was performed according to the departmental dose-optimization program which includes automated exposure control, adjustment of the mA and/or kV according to patient size and/or use of iterative  reconstruction technique. COMPARISON:  Multiple priors including CT January 06, 2024. FINDINGS: CT CHEST FINDINGS Cardiovascular: Right chest Port-A-Cath with tip near the superior cavoatrial junction. Aortic atherosclerosis. Normal size heart. Stable small pericardial effusion. Mediastinum/Nodes: No suspicious thyroid  nodule. Decreased size of the subpectoral and axillary lymph nodes. Previously indexed lymph node in the right axilla now measures 17 x 9 mm on image 11/2 previously 30 x 20 mm. No suspicious thyroid  nodule.  Esophagus is grossly unremarkable. Lungs/Pleura: Similar surgical changes of right upper lobectomy without new suspicious nodularity along the suture line. Stable 2 mm left upper lobe pulmonary nodule on image 41/6. Stable 2 mm left lower lobe pulmonary nodule on image 65/6. No new suspicious pulmonary nodules or masses. Musculoskeletal: Stable sclerotic lesion in the T11 vertebral body. No new aggressive lytic or blastic lesion of bone. CT ABDOMEN PELVIS FINDINGS Hepatobiliary: No suspicious hepatic lesion on noncontrast enhanced examination. Gallbladder is unremarkable. No biliary ductal dilation. Pancreas: No pancreatic ductal dilation or evidence  of acute inflammation. Pancreatic atrophy. Spleen: No splenomegaly. Hypodense splenic lesions not well seen on noncontrast enhanced examination. Adrenals/Urinary Tract: Decreased size of the bilateral adrenal nodules. For reference: -left adrenal nodule measures 12 x 11 mm on image 59/2 previously 2.6 x 2.2 cm -right adrenal nodule measures 10 x 7 mm on image 57/2 previously 2.2 x 1.3 cm. No hydronephrosis. Punctate nonobstructive left renal stone. Urinary bladder is minimally distended. Unchanged 12 mm bladder calculus. Stomach/Bowel: No radiopaque enteric contrast material was administered. Stomach is unremarkable for degree of distension. No pathologic dilation of small or large bowel. Pancolonic diverticulosis. Vascular/Lymphatic: Aortic atherosclerosis. Smooth IVC contours. No pathologically enlarged abdominal or pelvic lymph nodes. Reproductive: Status post hysterectomy. No adnexal masses. Other: No significant abdominopelvic free fluid. Musculoskeletal: Multilevel degenerative change of the spine. IMPRESSION: 1. Decreased size of the bilateral adrenal nodules and axillary/subpectoral lymph nodes. Compatible with treatment response. 2. Stable sclerotic lesion in the T11 vertebral body. 3. No evidence of new or progressive disease in the chest, abdomen or pelvis. 4. Stable tiny pulmonary nodules. 5.  Aortic Atherosclerosis (ICD10-I70.0). Electronically Signed   By: Reyes Holder M.D.   On: 03/17/2024 16:06       ASSESSMENT AND PLAN: This is a very pleasant 68 years old white female with stage IV (T1a, N3, M1 C) non-small cell lung cancer initially diagnosed as stage IA non-small cell lung cancer status post right upper lobectomy with lymph node dissection.  This was diagnosed in March 2018 with evidence for disease recurrence and metastasis in December 2024. The patient has been on observation and she is feeling fine today with no concerning complaints. The patient had repeat CT scan of the chest  performed recently.  I personally independently reviewed the scan images and discussed the results with the patient today.  Unfortunately her scan showed interval development of several abnormal lymph nodes throughout the chest including right hilar, mediastinal, supraclavicular and bilateral axillary as well as upper abdominal lymphadenopathy.  There was also increasing right adrenal nodule worrisome for adrenal metastasis. She underwent ultrasound-guided core biopsy of right axillary lymph node and the final pathology was consistent with metastatic adenocarcinoma of lung primary.  We will send the tissue block to [facility] for molecular studies and PD-L1 expression.  Molecular studies by [email or web address] showed positive KRAS G12C mutation. She underwent whole brain radiation for brain metastasis. The patient is here today to start the first dose of systemic chemotherapy with carboplatin  for AUC of 5, Alimta  500 Mg/M2  and Keytruda  200 Mg IV every 3 weeks.  First dose August 21, 2023.  Status post 7 cycles. She has been tolerating her treatment fairly well except for the recent syncopal episode secondary to anemia. She had repeat CT scan of the chest, abdomen and pelvis performed recently.  I personally and independently reviewed the scan and discussed the results with the patient today.  Unfortunately her scan showed evidence for disease progression with increasing bilateral axillary lymph node as well as adrenal gland lesions. She is currently on Krazati  (Adagrasib ) 600 mg p.o. twice daily started on 01/23/2024. She has been tolerating this treatment well except for few episodes of nausea and diarrhea. She had repeat CT scan of the chest, abdomen and pelvis as well as MRI of the brain that showed improvement of her disease. Assessment and Plan Assessment & Plan Non-small cell lung cancer with brain, lymph node, and adrenal metastases Non-small cell lung cancer with metastases to the brain, lymph nodes,  and adrenal glands. Currently on treatment with Krazati  (adagrasib ) 600 mg PO BID since January 23, 2024. Recent imaging shows improvement in brain MRI and CT scans of the chest, abdomen, and pelvis, indicating a good response to treatment. - Continue Krazati  600 mg PO BID - Evaluate for home health services and potential rehabilitation for strength improvement  Cancer-related fatigue and generalized weakness Experiencing significant fatigue and generalized weakness, impacting daily activities. Recent episode of prolonged immobility in the kitchen. Despite improvement in cancer status, weakness persists, likely related to previous brain metastases. Discussed potential for physical therapy and home health services to improve strength and safety. - Evaluate for home health services to assist with physical therapy - Consider referral to local rehabilitation facility for strength training and safety, pending insurance coverage and facility acceptance  Chemotherapy-induced nausea and vomiting Experiencing nausea and vomiting, likely related to Krazati . Vomited on the way to the appointment. Not consistently taking Zofran  as previously advised. Emphasized the importance of taking Zofran  30 minutes before Krazati  to prevent nausea and vomiting. - Instruct to take Zofran  30 minutes before Krazati  to prevent nausea and vomiting - Send refill for Zofran  if needed  Unintentional weight loss Reported a 10-pound weight loss. Appetite is poor, and there are challenges accessing food due to home layout. Utilizes [employer] for food delivery. Discussed the possibility of obtaining a small refrigerator for easier access to food and encouraging friends to assist with stocking it. - Suggest obtaining a small refrigerator for easier access to food - Encourage friends to assist with stocking the refrigerator - Ensure adequate nutrition through available resources She was advised to call immediately if she has any other  concerning symptoms in the interval.  The total time spent in the appointment was 30 minutes including review of chart and various tests results, discussions about plan of care and coordination of care plan .   The patient understands that this treatment is palliative in nature and there is no cure for her condition.  The goal of treatment is prolongation of life with palliation of her symptoms. The patient voices understanding of current disease status and treatment options and is in agreement with the current care plan. All questions were answered. The patient knows to call the clinic with any problems, questions or concerns. We can certainly see the patient much sooner if necessary.  Disclaimer: This note was dictated with voice recognition software. Similar sounding words can inadvertently be transcribed and may not be corrected upon review.

## 2024-03-19 ENCOUNTER — Encounter: Payer: Self-pay | Admitting: Internal Medicine

## 2024-03-19 ENCOUNTER — Other Ambulatory Visit: Payer: Self-pay

## 2024-03-19 ENCOUNTER — Telehealth: Payer: Self-pay | Admitting: Medical Oncology

## 2024-03-19 DIAGNOSIS — C3491 Malignant neoplasm of unspecified part of right bronchus or lung: Secondary | ICD-10-CM

## 2024-03-19 DIAGNOSIS — R110 Nausea: Secondary | ICD-10-CM

## 2024-03-19 MED ORDER — ONDANSETRON HCL 8 MG PO TABS: 8.0000 mg | ORAL_TABLET | Freq: Three times a day (TID) | ORAL | 0 refills | Status: DC | PRN

## 2024-03-19 NOTE — Telephone Encounter (Signed)
 [name] sent an email and said that [name] needs refills for her ondansetron . It is not listed on her MAR. I spoke to Robards @ [facility]  and he said her last ondansetron  was prescribed in January 2025. Pt confirmed.   [name] said she thought she was supposed to take it only if she vomits, which she said happened earlier in the week. I reeducated her on when to take it .   Pt was notified about home health referral and for PT evaluation. I told her that she needs to keep her phone with her at all times and to call 911 if she is experiencing dizziness, light headed , cannot walk with her walker or falls. She voiced understanding.

## 2024-03-19 NOTE — Telephone Encounter (Signed)
 Home health referral faxed  to [facility] with demographics and progress.

## 2024-03-20 ENCOUNTER — Other Ambulatory Visit: Payer: Self-pay

## 2024-03-20 ENCOUNTER — Encounter (HOSPITAL_COMMUNITY): Payer: Self-pay

## 2024-03-20 ENCOUNTER — Inpatient Hospital Stay (HOSPITAL_COMMUNITY)
Admission: EM | Admit: 2024-03-20 | Discharge: 2024-03-23 | DRG: 682 | Disposition: A | Discharge status: Skilled Nursing Facility | Attending: Family Medicine | Admitting: Family Medicine

## 2024-03-20 DIAGNOSIS — F32A Depression, unspecified: Secondary | ICD-10-CM | POA: Diagnosis present

## 2024-03-20 DIAGNOSIS — K219 Gastro-esophageal reflux disease without esophagitis: Secondary | ICD-10-CM | POA: Diagnosis present

## 2024-03-20 DIAGNOSIS — R531 Weakness: Secondary | ICD-10-CM | POA: Diagnosis present

## 2024-03-20 DIAGNOSIS — E782 Mixed hyperlipidemia: Secondary | ICD-10-CM | POA: Diagnosis present

## 2024-03-20 DIAGNOSIS — Z6821 Body mass index (BMI) 21.0-21.9, adult: Secondary | ICD-10-CM

## 2024-03-20 DIAGNOSIS — I10 Essential (primary) hypertension: Secondary | ICD-10-CM | POA: Diagnosis present

## 2024-03-20 DIAGNOSIS — C7931 Secondary malignant neoplasm of brain: Secondary | ICD-10-CM | POA: Diagnosis present

## 2024-03-20 DIAGNOSIS — D509 Iron deficiency anemia, unspecified: Secondary | ICD-10-CM | POA: Diagnosis present

## 2024-03-20 DIAGNOSIS — Z9221 Personal history of antineoplastic chemotherapy: Secondary | ICD-10-CM

## 2024-03-20 DIAGNOSIS — E1165 Type 2 diabetes mellitus with hyperglycemia: Secondary | ICD-10-CM | POA: Diagnosis present

## 2024-03-20 DIAGNOSIS — R296 Repeated falls: Secondary | ICD-10-CM | POA: Diagnosis present

## 2024-03-20 DIAGNOSIS — Z515 Encounter for palliative care: Secondary | ICD-10-CM | POA: Diagnosis not present

## 2024-03-20 DIAGNOSIS — Z7984 Long term (current) use of oral hypoglycemic drugs: Secondary | ICD-10-CM | POA: Diagnosis not present

## 2024-03-20 DIAGNOSIS — Z923 Personal history of irradiation: Secondary | ICD-10-CM

## 2024-03-20 DIAGNOSIS — G4700 Insomnia, unspecified: Secondary | ICD-10-CM | POA: Diagnosis present

## 2024-03-20 DIAGNOSIS — E871 Hypo-osmolality and hyponatremia: Secondary | ICD-10-CM | POA: Diagnosis present

## 2024-03-20 DIAGNOSIS — Z87891 Personal history of nicotine dependence: Secondary | ICD-10-CM | POA: Diagnosis not present

## 2024-03-20 DIAGNOSIS — E119 Type 2 diabetes mellitus without complications: Secondary | ICD-10-CM

## 2024-03-20 DIAGNOSIS — Z833 Family history of diabetes mellitus: Secondary | ICD-10-CM

## 2024-03-20 DIAGNOSIS — Z95828 Presence of other vascular implants and grafts: Secondary | ICD-10-CM

## 2024-03-20 DIAGNOSIS — C7972 Secondary malignant neoplasm of left adrenal gland: Secondary | ICD-10-CM | POA: Diagnosis present

## 2024-03-20 DIAGNOSIS — D61818 Other pancytopenia: Secondary | ICD-10-CM | POA: Diagnosis present

## 2024-03-20 DIAGNOSIS — D61810 Antineoplastic chemotherapy induced pancytopenia: Secondary | ICD-10-CM | POA: Diagnosis present

## 2024-03-20 DIAGNOSIS — D696 Thrombocytopenia, unspecified: Secondary | ICD-10-CM | POA: Diagnosis present

## 2024-03-20 DIAGNOSIS — Z79899 Other long term (current) drug therapy: Secondary | ICD-10-CM | POA: Diagnosis not present

## 2024-03-20 DIAGNOSIS — C3491 Malignant neoplasm of unspecified part of right bronchus or lung: Secondary | ICD-10-CM | POA: Diagnosis present

## 2024-03-20 DIAGNOSIS — N179 Acute kidney failure, unspecified: Principal | ICD-10-CM | POA: Diagnosis present

## 2024-03-20 DIAGNOSIS — G8929 Other chronic pain: Secondary | ICD-10-CM | POA: Diagnosis present

## 2024-03-20 DIAGNOSIS — C799 Secondary malignant neoplasm of unspecified site: Secondary | ICD-10-CM

## 2024-03-20 DIAGNOSIS — R627 Adult failure to thrive: Secondary | ICD-10-CM | POA: Diagnosis present

## 2024-03-20 DIAGNOSIS — Z860100 Personal history of colon polyps, unspecified: Secondary | ICD-10-CM

## 2024-03-20 DIAGNOSIS — C7971 Secondary malignant neoplasm of right adrenal gland: Secondary | ICD-10-CM | POA: Diagnosis present

## 2024-03-20 DIAGNOSIS — Z85841 Personal history of malignant neoplasm of brain: Secondary | ICD-10-CM

## 2024-03-20 DIAGNOSIS — Z888 Allergy status to other drugs, medicaments and biological substances status: Secondary | ICD-10-CM

## 2024-03-20 DIAGNOSIS — Z86711 Personal history of pulmonary embolism: Secondary | ICD-10-CM | POA: Diagnosis not present

## 2024-03-20 DIAGNOSIS — Z85118 Personal history of other malignant neoplasm of bronchus and lung: Secondary | ICD-10-CM

## 2024-03-20 DIAGNOSIS — E860 Dehydration: Secondary | ICD-10-CM | POA: Diagnosis present

## 2024-03-20 DIAGNOSIS — Z902 Acquired absence of lung [part of]: Secondary | ICD-10-CM

## 2024-03-20 DIAGNOSIS — Z7189 Other specified counseling: Secondary | ICD-10-CM | POA: Diagnosis not present

## 2024-03-20 DIAGNOSIS — E7800 Pure hypercholesterolemia, unspecified: Secondary | ICD-10-CM | POA: Diagnosis present

## 2024-03-20 LAB — CBC WITH DIFFERENTIAL/PLATELET
Abs Immature Granulocytes: 0 K/uL (ref 0.00–0.07)
Basophils Absolute: 0 K/uL (ref 0.0–0.1)
Basophils Relative: 0 %
Eosinophils Absolute: 0.2 K/uL (ref 0.0–0.5)
Eosinophils Relative: 4 %
HCT: 24.7 % — ABNORMAL LOW (ref 36.0–46.0)
Hemoglobin: 8.6 g/dL — ABNORMAL LOW (ref 12.0–15.0)
Lymphocytes Relative: 10 %
Lymphs Abs: 0.4 K/uL — ABNORMAL LOW (ref 0.7–4.0)
MCH: 30 pg (ref 26.0–34.0)
MCHC: 34.8 g/dL (ref 30.0–36.0)
MCV: 86.1 fL (ref 80.0–100.0)
Metamyelocytes Relative: 1 %
Monocytes Absolute: 0.2 K/uL (ref 0.1–1.0)
Monocytes Relative: 5 %
Neutro Abs: 3 K/uL (ref 1.7–7.7)
Neutrophils Relative %: 80 %
Platelets: 74 K/uL — ABNORMAL LOW (ref 150–400)
RBC: 2.87 MIL/uL — ABNORMAL LOW (ref 3.87–5.11)
RDW: 13.6 % (ref 11.5–15.5)
Smear Review: NORMAL
WBC: 3.8 K/uL — ABNORMAL LOW (ref 4.0–10.5)
nRBC: 0 % (ref 0.0–0.2)

## 2024-03-20 LAB — BASIC METABOLIC PANEL WITH GFR
Anion gap: 8 (ref 5–15)
BUN: 32 mg/dL — ABNORMAL HIGH (ref 8–23)
CO2: 21 mmol/L — ABNORMAL LOW (ref 22–32)
Calcium: 7.9 mg/dL — ABNORMAL LOW (ref 8.9–10.3)
Chloride: 102 mmol/L (ref 98–111)
Creatinine, Ser: 2.23 mg/dL — ABNORMAL HIGH (ref 0.44–1.00)
GFR, Estimated: 23 mL/min — ABNORMAL LOW (ref >60)
Glucose, Bld: 127 mg/dL — ABNORMAL HIGH (ref 70–99)
Potassium: 4 mmol/L (ref 3.5–5.1)
Sodium: 131 mmol/L — ABNORMAL LOW (ref 135–145)

## 2024-03-20 LAB — HEPATIC FUNCTION PANEL
ALT: 19 U/L (ref 0–44)
AST: 50 U/L — ABNORMAL HIGH (ref 15–41)
Albumin: 2.4 g/dL — ABNORMAL LOW (ref 3.5–5.0)
Alkaline Phosphatase: 115 U/L (ref 38–126)
Bilirubin, Direct: 0.2 mg/dL (ref 0.0–0.2)
Indirect Bilirubin: 0.6 mg/dL (ref 0.3–0.9)
Total Bilirubin: 0.8 mg/dL (ref 0.0–1.2)
Total Protein: 5 g/dL — ABNORMAL LOW (ref 6.5–8.1)

## 2024-03-20 LAB — GLUCOSE, CAPILLARY
Glucose-Capillary: 116 mg/dL — ABNORMAL HIGH (ref 70–99)
Glucose-Capillary: 169 mg/dL — ABNORMAL HIGH (ref 70–99)

## 2024-03-20 LAB — URINALYSIS, W/ REFLEX TO CULTURE (INFECTION SUSPECTED)
Bilirubin Urine: NEGATIVE
Glucose, UA: NEGATIVE mg/dL
Ketones, ur: NEGATIVE mg/dL
Nitrite: NEGATIVE
Protein, ur: NEGATIVE mg/dL
Specific Gravity, Urine: 1.01 (ref 1.005–1.030)
pH: 5 (ref 5.0–8.0)

## 2024-03-20 LAB — HIV ANTIBODY (ROUTINE TESTING W REFLEX): HIV Screen 4th Generation wRfx: NONREACTIVE

## 2024-03-20 LAB — MAGNESIUM: Magnesium: 1.7 mg/dL (ref 1.7–2.4)

## 2024-03-20 LAB — CK: Total CK: 50 U/L (ref 38–234)

## 2024-03-20 LAB — PHOSPHORUS: Phosphorus: 3.4 mg/dL (ref 2.5–4.6)

## 2024-03-20 LAB — URIC ACID: Uric Acid, Serum: 10.3 mg/dL — ABNORMAL HIGH (ref 2.5–7.1)

## 2024-03-20 MED ORDER — FLEET ENEMA RE ENEM: 1.0000 | ENEMA | Freq: Once | RECTAL | Status: DC | PRN

## 2024-03-20 MED ORDER — SODIUM CHLORIDE 0.9% FLUSH
10.0000 mL | Freq: Two times a day (BID) | INTRAVENOUS | Status: DC
  Administered 2024-03-21 – 2024-03-23 (×4): 10 mL

## 2024-03-20 MED ORDER — SODIUM CHLORIDE 0.9% FLUSH: 10.0000 mL | INTRAVENOUS | Status: DC | PRN

## 2024-03-20 MED ORDER — LEVALBUTEROL HCL 0.63 MG/3ML IN NEBU
0.6300 mg | INHALATION_SOLUTION | Freq: Four times a day (QID) | RESPIRATORY_TRACT | Status: DC | PRN
Start: 2024-03-20 — End: 2024-03-23

## 2024-03-20 MED ORDER — LIDOCAINE VISCOUS HCL 2 % MT SOLN: 15.0000 mL | Freq: Four times a day (QID) | OROMUCOSAL | Status: DC | PRN

## 2024-03-20 MED ORDER — FOLIC ACID 1 MG PO TABS
1.0000 mg | ORAL_TABLET | Freq: Every day | ORAL | Status: DC
  Administered 2024-03-21 – 2024-03-23 (×3): 1 mg via ORAL
  Filled 2024-03-20 (×3): qty 1

## 2024-03-20 MED ORDER — TRAZODONE HCL 50 MG PO TABS: 25.0000 mg | ORAL_TABLET | Freq: Every evening | ORAL | Status: DC | PRN

## 2024-03-20 MED ORDER — BISACODYL 5 MG PO TBEC: 5.0000 mg | DELAYED_RELEASE_TABLET | Freq: Every day | ORAL | Status: DC | PRN

## 2024-03-20 MED ORDER — ADAGRASIB 200 MG PO TABS: 600.0000 mg | ORAL_TABLET | Freq: Two times a day (BID) | ORAL | Status: DC

## 2024-03-20 MED ORDER — SODIUM CHLORIDE 0.9% FLUSH
3.0000 mL | Freq: Two times a day (BID) | INTRAVENOUS | Status: DC
  Administered 2024-03-20 – 2024-03-23 (×5): 3 mL via INTRAVENOUS

## 2024-03-20 MED ORDER — ACETAMINOPHEN 650 MG RE SUPP: 650.0000 mg | Freq: Four times a day (QID) | RECTAL | Status: DC | PRN

## 2024-03-20 MED ORDER — HYDROMORPHONE HCL 1 MG/ML IJ SOLN: 0.5000 mg | INTRAMUSCULAR | Status: DC | PRN

## 2024-03-20 MED ORDER — HYDRALAZINE HCL 20 MG/ML IJ SOLN: 10.0000 mg | INTRAMUSCULAR | Status: DC | PRN

## 2024-03-20 MED ORDER — OXYCODONE HCL 5 MG PO TABS
5.0000 mg | ORAL_TABLET | ORAL | Status: DC | PRN
  Filled 2024-03-20: qty 1

## 2024-03-20 MED ORDER — INSULIN ASPART 100 UNIT/ML IJ SOLN
0.0000 [IU] | Freq: Three times a day (TID) | INTRAMUSCULAR | Status: DC
  Administered 2024-03-21: 1 [IU] via SUBCUTANEOUS
  Administered 2024-03-22: 2 [IU] via SUBCUTANEOUS
  Administered 2024-03-22 – 2024-03-23 (×2): 1 [IU] via SUBCUTANEOUS

## 2024-03-20 MED ORDER — SENNOSIDES-DOCUSATE SODIUM 8.6-50 MG PO TABS: 1.0000 | ORAL_TABLET | Freq: Every evening | ORAL | Status: DC | PRN

## 2024-03-20 MED ORDER — MAGIC MOUTHWASH
5.0000 mL | Freq: Three times a day (TID) | ORAL | Status: DC | PRN
  Filled 2024-03-20: qty 5

## 2024-03-20 MED ORDER — HEPARIN SODIUM (PORCINE) 5000 UNIT/ML IJ SOLN
5000.0000 [IU] | Freq: Three times a day (TID) | INTRAMUSCULAR | Status: DC
  Administered 2024-03-20 – 2024-03-21 (×3): 5000 [IU] via SUBCUTANEOUS
  Filled 2024-03-20 (×3): qty 1

## 2024-03-20 MED ORDER — ACETAMINOPHEN 325 MG PO TABS
650.0000 mg | ORAL_TABLET | Freq: Four times a day (QID) | ORAL | Status: DC | PRN
  Filled 2024-03-20: qty 2

## 2024-03-20 MED ORDER — SODIUM CHLORIDE 0.9 % IV SOLN: INTRAVENOUS | Status: DC

## 2024-03-20 MED ORDER — LOPERAMIDE HCL 2 MG PO CAPS
2.0000 mg | ORAL_CAPSULE | Freq: Every day | ORAL | Status: DC
  Administered 2024-03-21 – 2024-03-23 (×3): 2 mg via ORAL
  Filled 2024-03-20 (×3): qty 1

## 2024-03-20 MED ORDER — PANTOPRAZOLE SODIUM 40 MG PO TBEC
40.0000 mg | DELAYED_RELEASE_TABLET | Freq: Every day | ORAL | Status: DC
  Administered 2024-03-21 – 2024-03-23 (×3): 40 mg via ORAL
  Filled 2024-03-20 (×3): qty 1

## 2024-03-20 MED ORDER — TRAZODONE HCL 50 MG PO TABS: 200.0000 mg | ORAL_TABLET | Freq: Every evening | ORAL | Status: DC | PRN

## 2024-03-20 MED ORDER — SODIUM CHLORIDE 0.9 % IV BOLUS
1000.0000 mL | Freq: Once | INTRAVENOUS | Status: AC
  Administered 2024-03-20: 1000 mL via INTRAVENOUS

## 2024-03-20 MED ORDER — ONDANSETRON HCL 4 MG/2ML IJ SOLN
4.0000 mg | Freq: Four times a day (QID) | INTRAMUSCULAR | Status: DC | PRN
  Administered 2024-03-21 – 2024-03-22 (×4): 4 mg via INTRAVENOUS
  Filled 2024-03-20 (×4): qty 2

## 2024-03-20 MED ORDER — SODIUM CHLORIDE 0.9% FLUSH
3.0000 mL | Freq: Two times a day (BID) | INTRAVENOUS | Status: DC
Start: 2024-03-20 — End: 2024-03-22
  Administered 2024-03-20 – 2024-03-21 (×2): 3 mL via INTRAVENOUS

## 2024-03-20 MED ORDER — ONDANSETRON HCL 4 MG PO TABS
4.0000 mg | ORAL_TABLET | Freq: Four times a day (QID) | ORAL | Status: DC | PRN
  Administered 2024-03-23: 4 mg via ORAL
  Filled 2024-03-20: qty 1

## 2024-03-20 MED ORDER — ESCITALOPRAM OXALATE 10 MG PO TABS
10.0000 mg | ORAL_TABLET | Freq: Every day | ORAL | Status: DC
  Administered 2024-03-21 – 2024-03-23 (×3): 10 mg via ORAL
  Filled 2024-03-20 (×3): qty 1

## 2024-03-20 MED ORDER — LACTATED RINGERS IV SOLN: INTRAVENOUS | Status: AC

## 2024-03-20 MED ORDER — MEGESTROL ACETATE 400 MG/10ML PO SUSP
400.0000 mg | Freq: Every day | ORAL | Status: DC
  Administered 2024-03-20 – 2024-03-23 (×4): 400 mg via ORAL
  Filled 2024-03-20 (×4): qty 10

## 2024-03-20 MED ORDER — CHLORHEXIDINE GLUCONATE CLOTH 2 % EX PADS
6.0000 | MEDICATED_PAD | Freq: Every day | CUTANEOUS | Status: DC
  Administered 2024-03-20 – 2024-03-23 (×3): 6 via TOPICAL

## 2024-03-20 MED ORDER — IPRATROPIUM BROMIDE 0.02 % IN SOLN: 0.5000 mg | Freq: Four times a day (QID) | RESPIRATORY_TRACT | Status: DC | PRN

## 2024-03-20 NOTE — Assessment & Plan Note (Signed)
 Continue as needed narcotics.

## 2024-03-20 NOTE — TOC Initial Note (Signed)
 Transition of Care [facility]) - Initial/Assessment Note    Patient Details  Name: [name] MRN: [id number] Date of Birth: April 25, 1956  Transition of Care [facility]) CM/SW Contact:    Nena LITTIE Coffee, RN Phone Number: 03/20/2024, 5:32 PM  Clinical Narrative:                 Pt admitted c/AKI, weakness r/t chemo and radiation. Assessed for SNF placement. She is from home alone, has a walker, no services, no oxygen. A&O x4. PT eval pending.   Expected Discharge Plan: Skilled Nursing Facility Barriers to Discharge: Continued Medical Work up   Patient Goals and CMS Choice Patient states their goals for this hospitalization and ongoing recovery are:: Strengthening CMS Medicare.gov Compare Post Acute Care list provided to:: Patient Choice offered to / list presented to : Patient [place] ownership interest in [facility].provided to:: Patient    Expected Discharge Plan and Services In-house Referral: Clinical Social Work Discharge Planning Services: CM Consult Post Acute Care Choice: Skilled Nursing Facility Living arrangements for the past 2 months: Single Family Home                                      Prior Living Arrangements/Services Living arrangements for the past 2 months: Single Family Home Lives with:: Self Patient language and need for interpreter reviewed:: Yes Do you feel safe going back to the place where you live?: No   due to profound weakness  Need for Family Participation in Patient Care: No (Comment) Care giver support system in place?: No (comment) Current home services: DME (walker) Criminal Activity/Legal Involvement Pertinent to Current Situation/Hospitalization: No - Comment as needed  Activities of Daily Living      Permission Sought/Granted Permission sought to share information with : Case Manager, Magazine features editor Permission granted to share information with : Yes, Verbal Permission Granted               Emotional Assessment Appearance:: Appears stated age Attitude/Demeanor/Rapport: Engaged Affect (typically observed): Appropriate, Calm Orientation: : Oriented to Self, Oriented to Place, Oriented to  Time, Oriented to Situation Alcohol / Substance Use: Not Applicable Psych Involvement: No (comment)  Admission diagnosis:  Weakness [R53.1] AKI (acute kidney injury) (HCC) [N17.9] Pancytopenia (HCC) [D61.818] Metastatic malignant neoplasm, unspecified site [facility]) [C79.9] Patient Active Problem List   Diagnosis Date Noted   AKI (acute kidney injury) (HCC) 03/20/2024   History of pulmonary embolism 03/20/2024   Hyponatremia 03/20/2024   Anemia 12/12/2023   Syncope 11/13/2023   Pancytopenia (HCC) 11/13/2023   Thrombocytopenia (HCC) 11/13/2023   Type 2 diabetes mellitus with hyperglycemia (HCC) 11/13/2023   Encounter for antineoplastic immunotherapy 09/11/2023   Encounter for antineoplastic chemotherapy 09/11/2023   Port-A-Cath in place 08/21/2023   Metastatic cancer to brain (HCC) 07/23/2023   GERD (gastroesophageal reflux disease) 12/04/2022   Chronic pain 01/02/2021   Decreased estrogen level 01/02/2021   Exostosis of skull 01/02/2021   Insomnia 01/02/2021   Long term (current) use of insulin  (HCC) 01/02/2021   Moderate recurrent major depression (HCC) 01/02/2021   Pure hypercholesterolemia 01/02/2021   Ventricular premature beats 01/02/2021   Obesity 07/15/2019   History of colonic polyps 03/09/2019   Microcytic anemia 03/09/2019   Dysphagia 03/09/2019   Stage IV adenocarcinoma of lung, right (HCC) 11/29/2016   S/P lobectomy of lung 10/15/2016   Lung nodule 10/02/2016   Adrenal adenoma, right  10/02/2016   Essential hypertension 05/31/2016   Increased frequency of urination 07/16/2014   Depression 10/13/2013   Fractured lateral malleolus 08/12/2012   Left knee sprain 08/12/2012   PCP:  Regino Slater, MD Pharmacy:   [phone] [place], [place] - [address] AT  [facility] [address] [place] [address] Phone: [phone] Fax: [phone]  [facility] - [place], [place] - One [facility] AT Portal to Registered [address] [place] [address] Phone: [phone] Fax: [phone]  [place] [facility] [address] [place] [address] Phone: [phone] Fax: [phone]     Social Drivers of Health (SDOH) Social History: SDOH Screenings   Food Insecurity: No Food Insecurity (03/20/2024)  Housing: Low Risk  (03/20/2024)  Transportation Needs: Unmet Transportation Needs (03/20/2024)  Utilities: Not At Risk (03/20/2024)  Alcohol Screen: Low Risk  (07/23/2023)  Depression (PHQ2-9): Low Risk  (02/05/2024)  Social Connections: Moderately Integrated (03/20/2024)  Tobacco Use: Medium Risk (03/20/2024)   SDOH Interventions:     Readmission Risk Interventions    03/20/2024    5:24 PM  Readmission Risk Prevention Plan  Transportation Screening Complete  Medication Review (RN Care Manager) Complete  PCP or Specialist appointment within 3-5 days of discharge Complete  HRI or Home Care Consult Complete  SW Recovery Care/Counseling Consult Complete  Palliative Care Screening Not Applicable  Skilled Nursing Facility Complete

## 2024-03-20 NOTE — Assessment & Plan Note (Signed)
 Will review home medication -not on any BP meds -Pressure stable

## 2024-03-20 NOTE — ED Notes (Signed)
 Pt is in active AFIB in triage from 27-70 HR. She has never been diagnosed with it.

## 2024-03-20 NOTE — Assessment & Plan Note (Signed)
-   Last A1c 5.6 -Currently not on any diabetic medications -Will check CBG q. ACHS, SSI coverage

## 2024-03-20 NOTE — Assessment & Plan Note (Signed)
-   Acute on chronic pancytopenia under chemotherapy      Latest Ref Rng & Units 03/20/2024   12:50 PM 03/12/2024    3:34 PM 02/19/2024    1:15 PM  CBC  WBC 4.0 - 10.5 K/uL 3.8  4.4  4.0   Hemoglobin 12.0 - 15.0 g/dL 8.6  8.7  9.0   Hematocrit 36.0 - 46.0 % 24.7  24.2  25.5   Platelets 150 - 400 K/uL 74  99  105    Will monitor closely  Thrombocytopenia -avoiding anticoagulants No signs of bleeding

## 2024-03-20 NOTE — Assessment & Plan Note (Signed)
 With pancytopenia, monitoring H&H closely currently stable -Checking iron  levels, folate and B12

## 2024-03-20 NOTE — Assessment & Plan Note (Addendum)
-   Follow-up with oncologist -Consulting palliative care  recurrent/metastatic non-small cell lung cancer, adenocarcinoma with metastatic adenopathy in the thorax as well as mediastinum, axillary lymph nodes bilateral hypermetabolic adrenal metastasis and hypermetabolic bone metastasis to the T11 vertebral body as well as brain metastasis diagnosed in December 2024. S/p radiation and chemotherapy  Currently on Krazati  (Adagrasib ) 600 mg p.o. twice daily started on 01/23/2024.

## 2024-03-20 NOTE — Assessment & Plan Note (Signed)
-   Continuing as needed trazodone

## 2024-03-20 NOTE — Assessment & Plan Note (Signed)
 Reviewing home meds continue PPI

## 2024-03-20 NOTE — Assessment & Plan Note (Signed)
 Mood stable, no other new medications

## 2024-03-20 NOTE — Assessment & Plan Note (Signed)
-   Will access port as needed

## 2024-03-20 NOTE — Assessment & Plan Note (Signed)
-   Stage IV metastatic adeno carcinoma of the lung -Under chemotherapy with Dr. Sherrod -He is to follow-up as an outpatient for continued treatment

## 2024-03-20 NOTE — Plan of Care (Signed)

## 2024-03-20 NOTE — Assessment & Plan Note (Signed)
 Current serum sodium level at 131, will continue with IV fluid hydration, will monitor closely Likely due to dehydration, poor p.o. intake

## 2024-03-20 NOTE — Assessment & Plan Note (Signed)
 Likely due to dehydration, poor p.o. intake -Gentle IV fluid hydration -Encouraging oral intake -Adding appetite stimulant -Megace 

## 2024-03-20 NOTE — Assessment & Plan Note (Signed)
-   Stable under chemo, radiation therapy with her oncologist

## 2024-03-20 NOTE — Assessment & Plan Note (Signed)
 History of pulmonary embolism in March 2025-was on Eliquis , subsequently led to bleeding, with her thrombocytopenia Therefore anticoagulation was discontinued.

## 2024-03-20 NOTE — ED Triage Notes (Signed)
 Pt is a cancer pt who is going through radiation and chemo. Pt has been feeling severely weak and is unable to care for herself at home currently. Pt also stated that she is falling a lot and most recent fall was this morning. Pt has friend with her that stated she was on the floor for 2.5 hours

## 2024-03-20 NOTE — ED Provider Notes (Signed)
 [place] EMERGENCY DEPARTMENT AT [facility] Provider Note   CSN: [id number] Arrival date & time: 03/20/24  1150     Patient presents with: Weakness   [name] is a 68 y.o. female.   HPI    68 year old patient with history of non-small cell lung cancer with metastases, currently on oral chemo comes in with chief complaint of increasing weakness.  Patient states that in the last 7 days, her weakness has profoundly worsened.  She is unable to get around her house.  She had a fall 2 days ago, had to be on the floor for several hours before help arrived.  She continues to have concerns about fall and has reduced getting around her house.  Review of system is negative for any nausea, vomiting, fevers, chills, chest pain, shortness of breath.  P.o. intake has come down.  Patient was just seen by oncology recently.  Friend at the bedside indicates that they impressed upon Dr. Sherrod, need for additional help, rehab, safety concerns.  Patient lives by herself.  She has no family.  Prior to Admission medications   Medication Sig Start Date End Date Taking? Authorizing Provider  folic acid  (FOLVITE ) 1 MG tablet Take 1 mg by mouth daily. 03/20/24  Yes [provider]  acetaminophen  (TYLENOL ) 500 MG tablet Take 1 tablet (500 mg total) by mouth every 6 (six) hours as needed. 08/07/17   Horton, Charmaine FALCON, MD  adagrasib  (KRAZATI ) 200 MG tablet Take 3 tablets (600 mg total) by mouth 2 (two) times daily. 01/13/24   Sherrod Sherrod, MD  escitalopram  (LEXAPRO ) 10 MG tablet Take 1 tablet (10 mg total) by mouth every morning. 10/25/23   Okey Barnie SAUNDERS, MD  ferrous sulfate  325 (65 FE) MG tablet Take 1 tablet (325 mg total) by mouth 2 (two) times daily with a meal. 11/15/23 02/24/24  Shahmehdi, Adriana LABOR, MD  lidocaine  (XYLOCAINE ) 2 % solution every 6 (six) hours as needed for mouth pain. Apply topically to mouth for sores 11/06/23   [provider]  loperamide  (IMODIUM ) 2 MG  capsule Take 2 mg by mouth as needed for diarrhea or loose stools. 01/13/24   [provider]  magic mouthwash SOLN Take 5 mLs by mouth 3 (three) times daily as needed for mouth pain. Suspension contains equal amounts of Maalox Extra Strength, nystatin, and diphenhydramine .    [provider]  methylPREDNISolone  (MEDROL  DOSEPAK) 4 MG TBPK tablet Take as prescribed on the package instructions. 03/02/24   Sherrod Sherrod, MD  ondansetron  (ZOFRAN ) 8 MG tablet Take 1 tablet (8 mg total) by mouth every 8 (eight) hours as needed for nausea (nausea and or vomiting). 03/19/24   Sherrod Sherrod, MD  pantoprazole  (PROTONIX ) 40 MG tablet TAKE 1 TABLET BY MOUTH TWICE A DAY 30 MINS BEFORE YOUR FIRST AND LAST MEAL. 11/20/23   Ezzard Sonny RAMAN, PA-C  traZODone  (DESYREL ) 100 MG tablet Take 2 tablets (200 mg total) by mouth at bedtime. 10/25/23   Okey Barnie SAUNDERS, MD  triamcinolone  cream (KENALOG ) 0.1 % Apply 1 Application topically 2 (two) times daily. Avoid use on face or private areas 02/27/24   Stuart Vernell Norris, PA-C    Allergies: Synthroid [levothyroxine sodium], Voltaren  [diclofenac  sodium], Hydrochlorothiazide, and Iodine    Review of Systems  All other systems reviewed and are negative.   Updated Vital Signs BP (!) 102/46 (BP Location: Right Arm)   Pulse 69   Temp 97.6 F (36.4 C) (Oral)   Resp 16  Ht 5' 7 (1.702 m)   Wt 61.2 kg   SpO2 100%   BMI 21.14 kg/m   Physical Exam Vitals and nursing note reviewed.  Constitutional:      General: She is not in acute distress.    Appearance: She is well-developed. She is ill-appearing. She is not toxic-appearing.  HENT:     Head: Atraumatic.  Cardiovascular:     Rate and Rhythm: Normal rate.  Pulmonary:     Effort: Pulmonary effort is normal.  Musculoskeletal:     Cervical back: Normal range of motion and neck supple.  Skin:    General: Skin is warm and dry.  Neurological:     Mental Status: She is alert and oriented to  person, place, and time.     (all labs ordered are listed, but only abnormal results are displayed) Labs Reviewed  CBC WITH DIFFERENTIAL/PLATELET - Abnormal; Notable for the following components:      Result Value   WBC 3.8 (*)    RBC 2.87 (*)    Hemoglobin 8.6 (*)    HCT 24.7 (*)    Platelets 74 (*)    Lymphs Abs 0.4 (*)    All other components within normal limits  BASIC METABOLIC PANEL WITH GFR - Abnormal; Notable for the following components:   Sodium 131 (*)    CO2 21 (*)    Glucose, Bld 127 (*)    BUN 32 (*)    Creatinine, Ser 2.23 (*)    Calcium  7.9 (*)    GFR, Estimated 23 (*)    All other components within normal limits  HEPATIC FUNCTION PANEL  URINALYSIS, W/ REFLEX TO CULTURE (INFECTION SUSPECTED)  URIC ACID  CK  MAGNESIUM  PHOSPHORUS    EKG: EKG Interpretation Date/Time:  Friday March 20 2024 12:23:49 EDT Ventricular Rate:  74 PR Interval:  170 QRS Duration:  127 QT Interval:  467 QTC Calculation: 701 R Axis:   0  Text Interpretation: Sinus tachycardia Ventricular premature complex Sinus pause RAE, consider biatrial enlargement Nonspecific intraventricular conduction delay Lateral ST changes Confirmed by Charlyn Sora [id number]) on 03/20/2024 12:36:08 PM  Radiology: No results found.   Procedures   Medications Ordered in the ED  lactated ringers  infusion (has no administration in time range)  sodium chloride  0.9 % bolus 1,000 mL (1,000 mLs Intravenous New Bag/Given 03/20/24 1331)                                    Medical Decision Making Amount and/or Complexity of Data Reviewed Labs: ordered.  Risk Prescription drug management. Decision regarding hospitalization.   68 year old female with history of non-small cell carcinoma of the lung with metastases comes in with chief complaint of worsening weakness.  Patient currently on oral chemotherapy.  No new medications.  She denies any recent illnesses including no fevers, chills.    Differential diagnosis for this patient includes severe anemia, medication side effects, severe electrolyte abnormality, AKI, cancer related deconditioning, pulmonary embolism  Plan is to get basic labs. Patient lives by herself, has no family in town which is another variable that is not in patient's favor.  I reviewed patient's records including labs  And oncology note from recent visit.  It appears that patient had a PE in March, however her Eliquis  was discontinued because of bleeding problems.  She denies any chest pain, shortness of breath, fainting.   Reassessment: On reassessment,  patient noted to have AKI, anemia that is at baseline and worsening thrombocytopenia.  She also has mild hypocalcemia.  We will proceed with admission request. At this time, I do not think we need to pursue PE workup.   Final diagnoses:  AKI (acute kidney injury) (HCC)  Pancytopenia (HCC)  Weakness  Metastatic malignant neoplasm, unspecified site [facility])    ED Discharge Orders     None          Charlyn Sora, MD 03/20/24 1415

## 2024-03-20 NOTE — NC FL2 (Signed)
 [place]  MEDICAID FL2 LEVEL OF CARE FORM     IDENTIFICATION  Patient Name: [name] Birthdate: 05-19-1956 Sex: female Admission Date (Current Location): 03/20/2024  [facility] and [place] Number:  [employer] and Address:  [facility],  [address], [place] [address]      Provider Number: [id number]  Attending Physician Name and Address:  Willette Adriana LABOR, MD  Relative Name and Phone Number:       Current Level of Care: Hospital Recommended Level of Care: Skilled Nursing Facility Prior Approval Number:    Date Approved/Denied: 03/20/24 PASRR Number: [id number]  Discharge Plan: SNF    Current Diagnoses: Patient Active Problem List   Diagnosis Date Noted   AKI (acute kidney injury) (HCC) 03/20/2024   History of pulmonary embolism 03/20/2024   Hyponatremia 03/20/2024   Anemia 12/12/2023   Syncope 11/13/2023   Pancytopenia (HCC) 11/13/2023   Thrombocytopenia (HCC) 11/13/2023   Type 2 diabetes mellitus with hyperglycemia (HCC) 11/13/2023   Encounter for antineoplastic immunotherapy 09/11/2023   Encounter for antineoplastic chemotherapy 09/11/2023   Port-A-Cath in place 08/21/2023   Metastatic cancer to brain (HCC) 07/23/2023   GERD (gastroesophageal reflux disease) 12/04/2022   Chronic pain 01/02/2021   Decreased estrogen level 01/02/2021   Exostosis of skull 01/02/2021   Insomnia 01/02/2021   Long term (current) use of insulin  (HCC) 01/02/2021   Moderate recurrent major depression (HCC) 01/02/2021   Pure hypercholesterolemia 01/02/2021   Ventricular premature beats 01/02/2021   Obesity 07/15/2019   History of colonic polyps 03/09/2019   Microcytic anemia 03/09/2019   Dysphagia 03/09/2019   Stage IV adenocarcinoma of lung, right (HCC) 11/29/2016   S/P lobectomy of lung 10/15/2016   Lung nodule 10/02/2016   Adrenal adenoma, right 10/02/2016   Essential hypertension 05/31/2016   Increased frequency of urination 07/16/2014    Depression 10/13/2013   Fractured lateral malleolus 08/12/2012   Left knee sprain 08/12/2012    Orientation RESPIRATION BLADDER Height & Weight     Self, Time, Situation, Place  Normal Continent Weight: 62.1 kg Height:  5' 7 (170.2 cm)  BEHAVIORAL SYMPTOMS/MOOD NEUROLOGICAL BOWEL NUTRITION STATUS      Continent Diet (see dc summary)  AMBULATORY STATUS COMMUNICATION OF NEEDS Skin   Extensive Assist Verbally Skin abrasions, Bruising (scattered; Skin tear to right elbow)                       Personal Care Assistance Level of Assistance  Bathing, Feeding, Dressing Bathing Assistance: Maximum assistance Feeding assistance: Independent Dressing Assistance: Maximum assistance     Functional Limitations Info  Sight, Hearing, Speech Sight Info: Adequate Hearing Info: Adequate Speech Info: Adequate    SPECIAL CARE FACTORS FREQUENCY  PT (By licensed PT), OT (By licensed OT)     PT Frequency: 5x weekly OT Frequency: 5x weekly            Contractures Contractures Info: Not present    Additional Factors Info  Code Status, Allergies Code Status Info: Full Allergies Info: Synthroid (Levothyroxine Sodium), Voltaren  (Diclofenac  Sodium), Hydrochlorothiazide, Iodine           Current Medications (03/20/2024):  This is the current hospital active medication list Current Facility-Administered Medications  Medication Dose Route Frequency Provider Last Rate Last Admin   0.9 %  sodium chloride  infusion   Intravenous Continuous Shahmehdi, Seyed A, MD 100 mL/hr at 03/20/24 1609 New Bag at 03/20/24 1609   acetaminophen  (TYLENOL ) tablet 650 mg  650 mg Oral Q6H PRN Shahmehdi, Adriana LABOR, MD       Or   acetaminophen  (TYLENOL ) suppository 650 mg  650 mg Rectal Q6H PRN Shahmehdi, Seyed A, MD       adagrasib  (KRAZATI ) tablet 600 mg  600 mg Oral BID Shahmehdi, Seyed A, MD       bisacodyl  (DULCOLAX) EC tablet 5 mg  5 mg Oral Daily PRN Shahmehdi, Adriana LABOR, MD       Chlorhexidine  Gluconate  Cloth 2 % PADS 6 each  6 each Topical Daily Shahmehdi, Seyed A, MD   6 each at 03/20/24 1705   escitalopram  (LEXAPRO ) tablet 10 mg  10 mg Oral Daily Shahmehdi, Seyed A, MD       folic acid  (FOLVITE ) tablet 1 mg  1 mg Oral Daily Shahmehdi, Seyed A, MD       heparin  injection 5,000 Units  5,000 Units Subcutaneous Q8H Shahmehdi, Seyed A, MD   5,000 Units at 03/20/24 1631   hydrALAZINE  (APRESOLINE ) injection 10 mg  10 mg Intravenous Q4H PRN Shahmehdi, Seyed A, MD       HYDROmorphone  (DILAUDID ) injection 0.5-1 mg  0.5-1 mg Intravenous Q2H PRN Shahmehdi, Seyed A, MD       insulin  aspart (novoLOG ) injection 0-6 Units  0-6 Units Subcutaneous TID WC Shahmehdi, Seyed A, MD       ipratropium (ATROVENT ) nebulizer solution 0.5 mg  0.5 mg Nebulization Q6H PRN Willette Adriana LABOR, MD       lactated ringers  infusion   Intravenous Continuous Nanavati, Ankit, MD 125 mL/hr at 03/20/24 1603 Other (enter comment in med admin window) at 03/20/24 1603   levalbuterol  (XOPENEX ) nebulizer solution 0.63 mg  0.63 mg Nebulization Q6H PRN Shahmehdi, Seyed A, MD       lidocaine  (XYLOCAINE ) 2 % viscous mouth solution 15 mL  15 mL Mouth/Throat Q6H PRN Shahmehdi, Adriana LABOR, MD       [START ON 03/21/2024] loperamide  (IMODIUM ) capsule 2 mg  2 mg Oral Daily Shahmehdi, Seyed A, MD       magic mouthwash  5 mL Oral TID PRN Willette Adriana LABOR, MD       megestrol  (MEGACE ) 400 MG/10ML suspension 400 mg  400 mg Oral Daily Shahmehdi, Seyed A, MD   400 mg at 03/20/24 1631   ondansetron  (ZOFRAN ) tablet 4 mg  4 mg Oral Q6H PRN Shahmehdi, Adriana LABOR, MD       Or   ondansetron  (ZOFRAN ) injection 4 mg  4 mg Intravenous Q6H PRN Shahmehdi, Seyed A, MD       oxyCODONE  (Oxy IR/ROXICODONE ) immediate release tablet 5 mg  5 mg Oral Q4H PRN Shahmehdi, Seyed A, MD       pantoprazole  (PROTONIX ) EC tablet 40 mg  40 mg Oral Daily Shahmehdi, Seyed A, MD       senna-docusate (Senokot-S) tablet 1 tablet  1 tablet Oral QHS PRN Shahmehdi, Seyed A, MD       sodium  chloride flush (NS) 0.9 % injection 10-40 mL  10-40 mL Intracatheter Q12H Shahmehdi, Seyed A, MD       sodium chloride  flush (NS) 0.9 % injection 10-40 mL  10-40 mL Intracatheter PRN Shahmehdi, Seyed A, MD       sodium chloride  flush (NS) 0.9 % injection 3 mL  3 mL Intravenous Q12H Shahmehdi, Seyed A, MD   3 mL at 03/20/24 1611   sodium chloride  flush (NS) 0.9 % injection 3 mL  3 mL Intravenous Q12H Shahmehdi, Seyed  A, MD   3 mL at 03/20/24 1611   sodium phosphate  (FLEET) enema 1 enema  1 enema Rectal Once PRN Shahmehdi, Seyed A, MD       traZODone  (DESYREL ) tablet 200 mg  200 mg Oral QHS PRN Willette Adriana LABOR, MD         Discharge Medications: Please see discharge summary for a list of discharge medications.  Relevant Imaging Results:  Relevant Lab Results:   Additional Information SSN: [id number]  Nena LITTIE Coffee, RN

## 2024-03-20 NOTE — Hospital Course (Signed)
 [name] is a 68 year old Female with history of metastatic non-small lung cancer on oral chemotherapy, also history of HTN, HLD, DM 2, GERD, pancytopenia, bilateral PE (was on Eliquis ) with subsequent complication of bleeding..  Presenting with progressive generalized weaknesses, poor p.o. intake for past 7 days.  Patient and her weaknesses per family has gotten worse unable to carry her ADLs.  Associated with recurrent falls with no traumatic injuries.   ED evaluation: Blood pressure (!) 102/46, pulse 69, temperature 97.6 F (36.4 C), temperature source Oral, resp. rate 16, height 5' 7 (1.702 m), weight 61.2 kg, SpO2 100%.  LABs: Sodium 131 CO2 21 glucose 127 BUN 32, creatinine 2.23, calcium  7.9, albumin  2.4, uric acid 10.3 AST 50, ALT 19, WBC 3.8, hemoglobin 8.6, hematocrit 27.7, platelets 74, A1c 5.6,  Requested for the patient to be admitted from progressive weakness, exacerbated by dehydration, AKI, failure to thrive.

## 2024-03-20 NOTE — Assessment & Plan Note (Signed)
 Not on any statins -Checking fasting lipid panel, Will monitor LFTs

## 2024-03-20 NOTE — H&P (Addendum)
 History and Physical   Patient: [name]                            PCP: Regino Slater, MD                    DOB: 01/13/1956            DOA: 03/20/2024 FMW:[id number]             DOS: 03/20/2024, 2:41 PM  Koirala, Dibas, MD  Patient coming from:   HOME  I have personally reviewed patient's medical records, in electronic medical records, including:  [facility], and care everywhere.    Chief Complaint:   Chief Complaint  Patient presents with   Weakness / AKI     History of present illness:    [name] is a 68 year old Female with history of metastatic non-small lung cancer on oral chemotherapy, also history of HTN, HLD, DM 2, GERD, pancytopenia, bilateral PE (was on Eliquis ) with subsequent complication of bleeding..  Presenting with progressive generalized weaknesses, poor p.o. intake for past 7 days.  Patient and her weaknesses per family has gotten worse unable to carry her ADLs.  Associated with recurrent falls with no traumatic injuries.   ED evaluation: Blood pressure (!) 102/46, pulse 69, temperature 97.6 F (36.4 C), temperature source Oral, resp. rate 16, height 5' 7 (1.702 m), weight 61.2 kg, SpO2 100%.  LABs: Sodium 131 CO2 21 glucose 127 BUN 32, creatinine 2.23, calcium  7.9, albumin  2.4, uric acid 10.3 AST 50, ALT 19, WBC 3.8, hemoglobin 8.6, hematocrit 27.7, platelets 74, A1c 5.6,  Requested for the patient to be admitted from progressive weakness, exacerbated by dehydration, AKI, failure to thrive.   Patient Denies having: Fever, Chills, Cough, SOB, Chest Pain, Abd pain, N/V/D, headache, dizziness, lightheadedness,  Dysuria, Joint pain, rash, open wounds    Review of Systems: As per HPI, otherwise 10 point review of systems were negative.   ----------------------------------------------------------------------------------------------------------------------  Allergies  Allergen Reactions   Synthroid [Levothyroxine Sodium] Anaphylaxis    Voltaren  [Diclofenac  Sodium] Other (See Comments)    World wouldn't stop spinning NSAIDs shouldn't be taken by pt   Hydrochlorothiazide Other (See Comments)    Cramps    Iodine Rash    I get a skin rash sometimes if iodine applied to my skin.    Home MEDs:  Prior to Admission medications   Medication Sig Start Date End Date Taking? Authorizing Provider  adagrasib  (KRAZATI ) 200 MG tablet Take 3 tablets (600 mg total) by mouth 2 (two) times daily. Patient taking differently: Take 400 mg by mouth 2 (two) times daily. 01/13/24  Yes Sherrod Sherrod, MD  folic acid  (FOLVITE ) 1 MG tablet Take 1 mg by mouth daily. 03/20/24  Yes [provider]  lidocaine  (XYLOCAINE ) 2 % solution every 6 (six) hours as needed for mouth pain. Apply topically to mouth for sores 11/06/23  Yes [provider]  loperamide  (IMODIUM ) 2 MG capsule Take 2 mg by mouth daily. 01/13/24  Yes [provider]  magic mouthwash SOLN Take 5 mLs by mouth 3 (three) times daily as needed for mouth pain. Suspension contains equal amounts of Maalox Extra Strength, nystatin, and diphenhydramine .   Yes [provider]  ondansetron  (ZOFRAN ) 8 MG tablet Take 1 tablet (8 mg total) by mouth every 8 (eight) hours as needed for nausea (nausea and or vomiting). Patient taking differently:  Take 8 mg by mouth 2 (two) times daily. 03/19/24  Yes Sherrod Sherrod, MD  traZODone  (DESYREL ) 100 MG tablet Take 2 tablets (200 mg total) by mouth at bedtime. Patient taking differently: Take 200 mg by mouth at bedtime as needed for sleep. 10/25/23  Yes Okey Barnie SAUNDERS, MD  triamcinolone  cream (KENALOG ) 0.1 % Apply 1 Application topically 2 (two) times daily. Avoid use on face or private areas Patient taking differently: Apply 1 Application topically 2 (two) times daily as needed (Irritation). Avoid use on face or private areas 02/27/24  Yes Stuart Vernell Norris, PA-C  methylPREDNISolone  (MEDROL  DOSEPAK) 4 MG TBPK tablet Take as  prescribed on the package instructions. Patient not taking: Reported on 03/20/2024 03/02/24   Sherrod Sherrod, MD    PRN MEDs: acetaminophen  **OR** acetaminophen , bisacodyl , hydrALAZINE , HYDROmorphone  (DILAUDID ) injection, ipratropium, levalbuterol , lidocaine , magic mouthwash, ondansetron  **OR** ondansetron  (ZOFRAN ) IV, oxyCODONE , senna-docusate, sodium phosphate , traZODone   Past Medical History:  Diagnosis Date   Adenocarcinoma of right lung, stage 1 (HCC) 11/29/2016   Chronic pain    Depression    takes Lexapro  daily   Diabetes mellitus    takes Metformin  daily and has an insulin  pump.Fasting blood sugar runs250   GERD (gastroesophageal reflux disease)    takes Pantoprazole  daily   History of colon polyps    benign   Hyperlipidemia    takes Atorvastatin  daily   Hypertension    takes Amlodipine  and Lisinopril  daily   Insomnia    takes Trazodone  nightly   Nocturia    recurrent lung ca with mets to brain, LN, bone and adrenal gland 06/2023   Thyroid  disease    Urinary frequency    Urinary urgency     Past Surgical History:  Procedure Laterality Date   ABDOMINAL HYSTERECTOMY     ABDOMINAL SURGERY     BIOPSY  05/28/2019   Procedure: BIOPSY;  Surgeon: Harvey Margo CROME, MD;  [facility];  Service: Endoscopy;;  random colon/gastric/duodenum   BIOPSY  01/11/2023   Procedure: BIOPSY;  Surgeon: Cindie Carlin POUR, DO;  Location: [facility];  Service: Endoscopy;;   COLONOSCOPY  2005 MAC   TICs, IH   COLONOSCOPY N/A 03/08/2014   Procedure: COLONOSCOPY;  Surgeon: Margo CROME Harvey, MD;  Location: [facility];  Service: Endoscopy;  Laterality: N/A;  1015   COLONOSCOPY WITH PROPOFOL  N/A 05/28/2019   Procedure: COLONOSCOPY WITH PROPOFOL ;  Surgeon: Harvey Margo CROME, MD;  [facility]: [facility];  Service: Endoscopy;  Laterality: N/A;  9:15am   CYSTOSTOMY  11/22/2011   Procedure: CYSTOSTOMY SUPRAPUBIC;  Surgeon: Emery LILLETTE Blaze, MD;  Location: [facility];  Service: Urology;   Laterality: N/A;   ESOPHAGOGASTRODUODENOSCOPY N/A 03/08/2014   Procedure: ESOPHAGOGASTRODUODENOSCOPY (EGD);  Surgeon: Margo CROME Harvey, MD;  [facility]: [facility];  Service: Endoscopy;  Laterality: N/A;   ESOPHAGOGASTRODUODENOSCOPY     ESOPHAGOGASTRODUODENOSCOPY (EGD) WITH PROPOFOL  N/A 05/28/2019   Procedure: ESOPHAGOGASTRODUODENOSCOPY (EGD) WITH PROPOFOL ;  Surgeon: Harvey Margo CROME, MD;  [facility];  Service: Endoscopy;  Laterality: N/A;   ESOPHAGOGASTRODUODENOSCOPY (EGD) WITH PROPOFOL  N/A 01/11/2023   Procedure: ESOPHAGOGASTRODUODENOSCOPY (EGD) WITH PROPOFOL ;  Surgeon: Cindie Carlin POUR, DO;  [facility];  Service: Endoscopy;  Laterality: N/A;  9:45 am, asa 3   GIVENS CAPSULE STUDY N/A 06/15/2019   Procedure: GIVENS CAPSULE STUDY;  Surgeon: Harvey Margo CROME, MD;  [facility];  Service: Endoscopy;  Laterality: N/A;  7:30am   HALLUX VALGUS CORRECTION  bilateral foot surgery for bone repairs-multiple   IR IMAGING GUIDED PORT INSERTION  08/19/2023   LOBECTOMY Right 10/15/2016   Procedure: RIGHT UPPER LOBECTOMY;  Surgeon: Elspeth JAYSON Millers, MD;  Location: [facility];  Service: Thoracic;  Laterality: Right;   LYMPH NODE DISSECTION Right 10/15/2016   Procedure: LYMPH NODE DISSECTION;  Surgeon: Elspeth JAYSON Millers, MD;  [facility];  Service: Thoracic;  Laterality: Right;   POLYPECTOMY  05/28/2019   Procedure: POLYPECTOMY;  Surgeon: Harvey Margo CROME, MD;  [facility];  Service: Endoscopy;;   SAVORY DILATION N/A 05/28/2019   Procedure: SAVORY DILATION;  Surgeon: Harvey Margo CROME, MD;  [facility];  Service: Endoscopy;  Laterality: N/A;  15/16/17   VAGINA RECONSTRUCTION SURGERY     tvt 2006- [place]    VIDEO ASSISTED THORACOSCOPY (VATS)/WEDGE RESECTION Right 10/15/2016   Procedure: RIGHT VIDEO ASSISTED THORACOSCOPY (VATS)/WEDGE RESECTION;  Surgeon: Elspeth JAYSON Millers, MD;  Location: [facility];  Service: Thoracic;  Laterality: Right;      reports that she has quit smoking. Her smoking use included cigarettes. She has a 32 pack-year smoking history. She has never been exposed to tobacco smoke. She has never used smokeless tobacco. She reports that she does not drink alcohol and does not use drugs.   Family History  Problem Relation Age of Onset   Asthma Other    Diabetes Other    Arthritis Other    Cancer Mother    Cancer Brother    Anesthesia problems Neg Hx    Hypotension Neg Hx    Malignant hyperthermia Neg Hx    Pseudochol deficiency Neg Hx    Colon polyps Neg Hx    Colon cancer Neg Hx    Celiac disease Neg Hx    Pancreatic cancer Neg Hx    Stomach cancer Neg Hx    Ulcerative colitis Neg Hx    Crohn's disease Neg Hx     Physical Exam:   Vitals:   03/20/24 1202 03/20/24 1203  BP: (!) 102/46   Pulse: 69   Resp: 16   Temp: 97.6 F (36.4 C)   TempSrc: Oral   SpO2: 100%   Weight:  61.2 kg  Height:  5' 7 (1.702 m)   Constitutional: NAD, calm, comfortable Eyes: PERRL, lids and conjunctivae normal ENMT: Mucous membranes are moist. Posterior pharynx clear of any exudate or lesions.Normal dentition.  Neck: normal, supple, no masses, no thyromegaly Respiratory: clear to auscultation bilaterally, no wheezing, no crackles. Normal respiratory effort. No accessory muscle use.  Cardiovascular: Regular rate and rhythm, no murmurs / rubs / gallops. No extremity edema. 2+ pedal pulses. No carotid bruits.  Abdomen: no tenderness, no masses palpated. No hepatosplenomegaly. Bowel sounds positive.  Musculoskeletal: Severe global generalized weakness no clubbing / cyanosis. No joint deformity upper and lower extremities. Good ROM, no contractures. Normal muscle tone.  Neurologic: CN II-XII grossly intact. Sensation intact, DTR normal. Strength 5/5 in all 4.  Psychiatric: Normal judgment and insight. Alert and oriented x 3. Normal mood.  Skin: no rashes, lesions, ulcers. No induration      Labs on admission:    I  have personally reviewed following labs and imaging studies  CBC: Recent Labs  Lab 03/20/24 1250  WBC 3.8*  NEUTROABS 3.0  HGB 8.6*  HCT 24.7*  MCV 86.1  PLT 74*   Basic Metabolic Panel: Recent Labs  Lab 03/20/24 1250  NA 131*  K 4.0  CL 102  CO2 21*  GLUCOSE 127*  BUN 32*  CREATININE 2.23*  CALCIUM  7.9*  MG 1.7  PHOS 3.4   GFR: Estimated Creatinine Clearance: 22.7 mL/min (A) (by C-G formula based on SCr of 2.23 mg/dL (H)). Liver Function Tests: Recent Labs  Lab 03/20/24 1250  AST 50*  ALT 19  ALKPHOS 115  BILITOT 0.8  PROT 5.0*  ALBUMIN  2.4*    Cardiac Enzymes: Recent Labs  Lab 03/20/24 1250  CKTOTAL 50   BNP (last 3 results)  Urine analysis:    Component Value Date/Time   COLORURINE YELLOW 11/12/2023 2248   APPEARANCEUR HAZY (A) 11/12/2023 2248   LABSPEC 1.016 11/12/2023 2248   PHURINE 5.0 11/12/2023 2248   GLUCOSEU NEGATIVE 11/12/2023 2248   HGBUR NEGATIVE 11/12/2023 2248   BILIRUBINUR NEGATIVE 11/12/2023 2248   KETONESUR NEGATIVE 11/12/2023 2248   PROTEINUR 30 (A) 11/12/2023 2248   NITRITE NEGATIVE 11/12/2023 2248   LEUKOCYTESUR MODERATE (A) 11/12/2023 2248    Last A1C:  Lab Results  Component Value Date   HGBA1C 5.6 02/24/2024     Radiologic Exams on Admission:   No results found.  EKG:   Independently reviewed.  Orders placed or performed during the hospital encounter of 03/20/24   ED EKG   ED EKG   EKG 12-Lead   ---------------------------------------------------------------------------------------------------------------------------------------    Assessment / Plan:   Principal Problem:   AKI (acute kidney injury) (HCC) Active Problems:   Stage IV adenocarcinoma of lung, right (HCC)   Depression   Microcytic anemia   Pure hypercholesterolemia   History of pulmonary embolism   Hyponatremia   S/P lobectomy of lung   Chronic pain   Type 2 diabetes mellitus with hyperglycemia (HCC)   Essential hypertension    Insomnia   GERD (gastroesophageal reflux disease)   Metastatic cancer to brain (HCC)   Port-A-Cath in place   Pancytopenia (HCC)   Assessment and Plan: * AKI (acute kidney injury) (HCC) Likely due to dehydration, poor p.o. intake -Gentle IV fluid hydration -Encouraging oral intake -Adding appetite stimulant -Megace   Stage IV adenocarcinoma of lung, right (HCC) - Stage IV metastatic adeno carcinoma of the lung -Under chemotherapy with Dr. Sherrod -He is to follow-up as an outpatient for continued treatment  Hyponatremia Current serum sodium level at 131, will continue with IV fluid hydration, will monitor closely Likely due to dehydration, poor p.o. intake  History of pulmonary embolism History of pulmonary embolism in March 2025-was on Eliquis , subsequently led to bleeding, with her thrombocytopenia Therefore anticoagulation was discontinued.   Pure hypercholesterolemia Not on any statins -Checking fasting lipid panel, Will monitor LFTs  Microcytic anemia With pancytopenia, monitoring H&H closely currently stable -Checking iron  levels, folate and B12  Depression Mood stable, no other new medications  Type 2 diabetes mellitus with hyperglycemia (HCC) - Last A1c 5.6 -Currently not on any diabetic medications -Will check CBG q. ACHS, SSI coverage  Chronic pain - Continue as needed narcotics  S/P lobectomy of lung - Stable under chemo, radiation therapy with her oncologist  Pancytopenia (HCC) - Acute on chronic pancytopenia under chemotherapy      Latest Ref Rng & Units 03/20/2024   12:50 PM 03/12/2024    3:34 PM 02/19/2024    1:15 PM  CBC  WBC 4.0 - 10.5 K/uL 3.8  4.4  4.0   Hemoglobin 12.0 - 15.0 g/dL 8.6  8.7  9.0   Hematocrit 36.0 - 46.0 % 24.7  24.2  25.5   Platelets 150 - 400 K/uL 74  99  105    Will monitor closely  Thrombocytopenia -avoiding anticoagulants No signs of bleeding  Port-A-Cath in place - Will access port as needed  Metastatic  cancer to brain [facility]) - Follow-up with oncologist -Consulting palliative care  recurrent/metastatic non-small cell lung cancer, adenocarcinoma with metastatic adenopathy in the thorax as well as mediastinum, axillary lymph nodes bilateral hypermetabolic adrenal metastasis and hypermetabolic bone metastasis to the T11 vertebral body as well as brain metastasis diagnosed in December 2024. S/p radiation and chemotherapy  Currently on Krazati  (Adagrasib ) 600 mg p.o. twice daily started on 01/23/2024.    GERD (gastroesophageal reflux disease) Reviewing home meds continue PPI  Insomnia - Continuing as needed trazodone   Essential hypertension Will review home medication -not on any BP meds -Pressure stable     Consults called:  PT OT/TOC  -------------------------------------------------------------------------------------------------------------------------------------------- DVT prophylaxis:  heparin  injection 5,000 Units Start: 03/20/24 1415 SCDs Start: 03/20/24 1404   Code Status:   Code Status: Full Code Goals of care, CODE STATUS discussed with patient in detail-confirmed full CODE STATUS   Admission status: Patient will be admitted as Inpatient, with a greater than 2 midnight length of stay. Level of care: Med-Surg   Family Communication: Caregiver/neighbor present at bedside (The above findings and plan of care has been discussed with patient in detail, the patient expressed understanding and agreement of above plan)  --------------------------------------------------------------------------------------------------------------------------------------------------  Disposition Plan:  Anticipated 1-2 days Status is: Inpatient Remains inpatient appropriate because: IV fluids, PT OT evaluation, medication management From home Willing to go to rehab SNF if  needed   --------------------------------------------------------------------------------------------------------------------  Time spent:  17  Min.  Was spent seeing and evaluating the patient, reviewing all medical records, drawn plan of care.  SIGNED: Adriana DELENA Grams, MD, FHM. FAAFP. [place] [facility], Pager  (Please use [email or web address] to page/ or secure chat through epic) If 7PM-7AM, please contact night-coverage [email or web address],  03/20/2024, 2:41 PM

## 2024-03-21 DIAGNOSIS — N179 Acute kidney failure, unspecified: Secondary | ICD-10-CM | POA: Diagnosis not present

## 2024-03-21 LAB — IRON AND TIBC
Iron: 33 ug/dL (ref 28–170)
Saturation Ratios: 22 % (ref 10.4–31.8)
TIBC: 149 ug/dL — ABNORMAL LOW (ref 250–450)
UIBC: 116 ug/dL

## 2024-03-21 LAB — BASIC METABOLIC PANEL WITH GFR
Anion gap: 4 — ABNORMAL LOW (ref 5–15)
BUN: 29 mg/dL — ABNORMAL HIGH (ref 8–23)
CO2: 21 mmol/L — ABNORMAL LOW (ref 22–32)
Calcium: 7.6 mg/dL — ABNORMAL LOW (ref 8.9–10.3)
Chloride: 110 mmol/L (ref 98–111)
Creatinine, Ser: 1.68 mg/dL — ABNORMAL HIGH (ref 0.44–1.00)
GFR, Estimated: 33 mL/min — ABNORMAL LOW (ref >60)
Glucose, Bld: 120 mg/dL — ABNORMAL HIGH (ref 70–99)
Potassium: 4.2 mmol/L (ref 3.5–5.1)
Sodium: 135 mmol/L (ref 135–145)

## 2024-03-21 LAB — CBC
HCT: 22.2 % — ABNORMAL LOW (ref 36.0–46.0)
Hemoglobin: 7.5 g/dL — ABNORMAL LOW (ref 12.0–15.0)
MCH: 29.5 pg (ref 26.0–34.0)
MCHC: 33.8 g/dL (ref 30.0–36.0)
MCV: 87.4 fL (ref 80.0–100.0)
Platelets: 62 K/uL — ABNORMAL LOW (ref 150–400)
RBC: 2.54 MIL/uL — ABNORMAL LOW (ref 3.87–5.11)
RDW: 13.4 % (ref 11.5–15.5)
WBC: 3.3 K/uL — ABNORMAL LOW (ref 4.0–10.5)
nRBC: 0 % (ref 0.0–0.2)

## 2024-03-21 LAB — GLUCOSE, CAPILLARY
Glucose-Capillary: 126 mg/dL — ABNORMAL HIGH (ref 70–99)
Glucose-Capillary: 156 mg/dL — ABNORMAL HIGH (ref 70–99)
Glucose-Capillary: 120 mg/dL — ABNORMAL HIGH (ref 70–99)
Glucose-Capillary: 117 mg/dL — ABNORMAL HIGH (ref 70–99)

## 2024-03-21 LAB — FOLATE: Folate: 7.8 ng/mL (ref >5.9)

## 2024-03-21 LAB — APTT: aPTT: 74 s — ABNORMAL HIGH (ref 24–36)

## 2024-03-21 LAB — HEMOGLOBIN AND HEMATOCRIT, BLOOD
HCT: 23.3 % — ABNORMAL LOW (ref 36.0–46.0)
Hemoglobin: 7.9 g/dL — ABNORMAL LOW (ref 12.0–15.0)

## 2024-03-21 LAB — VITAMIN B12: Vitamin B-12: 787 pg/mL (ref 180–914)

## 2024-03-21 MED ORDER — BISACODYL 5 MG PO TBEC
5.0000 mg | DELAYED_RELEASE_TABLET | Freq: Every day | ORAL | Status: DC
  Administered 2024-03-21 – 2024-03-23 (×3): 5 mg via ORAL
  Filled 2024-03-21 (×3): qty 1

## 2024-03-21 MED ORDER — ADULT MULTIVITAMIN W/MINERALS CH
1.0000 | ORAL_TABLET | Freq: Every day | ORAL | Status: DC
  Administered 2024-03-22 – 2024-03-23 (×2): 1 via ORAL
  Filled 2024-03-21 (×2): qty 1

## 2024-03-21 MED ORDER — THIAMINE MONONITRATE 100 MG PO TABS
100.0000 mg | ORAL_TABLET | Freq: Every day | ORAL | Status: DC
  Administered 2024-03-22 – 2024-03-23 (×2): 100 mg via ORAL
  Filled 2024-03-21 (×2): qty 1

## 2024-03-21 MED ORDER — ADAGRASIB 200 MG PO TABS
400.0000 mg | ORAL_TABLET | Freq: Two times a day (BID) | ORAL | Status: DC
  Administered 2024-03-21 – 2024-03-23 (×4): 400 mg via ORAL
  Filled 2024-03-21 (×3): qty 2

## 2024-03-21 MED ORDER — SODIUM CHLORIDE 0.9 % IV SOLN: INTRAVENOUS | Status: AC

## 2024-03-21 MED ORDER — BOOST / RESOURCE BREEZE PO LIQD CUSTOM
1.0000 | Freq: Three times a day (TID) | ORAL | Status: DC
  Administered 2024-03-21: 237 mL via ORAL
  Administered 2024-03-22 – 2024-03-23 (×2): 1 via ORAL

## 2024-03-21 NOTE — Plan of Care (Signed)

## 2024-03-21 NOTE — Progress Notes (Signed)
 PROGRESS NOTE    Patient: [name]                            PCP: Regino Slater, MD                    DOB: 06-16-1956            DOA: 03/20/2024 FMW:[id number]             DOS: 03/21/2024, 11:29 AM   LOS: 1 day   Date of Service: The patient was seen and examined on 03/21/2024  Subjective:   The patient was seen and examined this morning. Hemodynamically stable. Noted for worsening pancytopenia no signs of bleeding No issues overnight .  Brief Narrative:   [name] is a 68 year old Female with history of metastatic non-small lung cancer on oral chemotherapy, also history of HTN, HLD, DM 2, GERD, pancytopenia, bilateral PE (was on Eliquis ) with subsequent complication of bleeding..  Presenting with progressive generalized weaknesses, poor p.o. intake for past 7 days.  Patient and her weaknesses per family has gotten worse unable to carry her ADLs.  Associated with recurrent falls with no traumatic injuries.   ED evaluation: Blood pressure (!) 102/46, pulse 69, temperature 97.6 F (36.4 C), temperature source Oral, resp. rate 16, height 5' 7 (1.702 m), weight 61.2 kg, SpO2 100%.  LABs: Sodium 131 CO2 21 glucose 127 BUN 32, creatinine 2.23, calcium  7.9, albumin  2.4, uric acid 10.3 AST 50, ALT 19, WBC 3.8, hemoglobin 8.6, hematocrit 27.7, platelets 74, A1c 5.6,  Requested for the patient to be admitted from progressive weakness, exacerbated by dehydration, AKI, failure to thrive.    Assessment & Plan:   Principal Problem:   AKI (acute kidney injury) (HCC) Active Problems:   Stage IV adenocarcinoma of lung, right (HCC)   Depression   Microcytic anemia   Pure hypercholesterolemia   History of pulmonary embolism   Hyponatremia   S/P lobectomy of lung   Chronic pain   Type 2 diabetes mellitus with hyperglycemia (HCC)   Essential hypertension   Insomnia   GERD (gastroesophageal reflux disease)   Metastatic cancer to brain (HCC)   Port-A-Cath in place    Pancytopenia (HCC)     Assessment and Plan: * AKI (acute kidney injury) (HCC) -Improving Likely due to dehydration, poor p.o. intake -Gentle IV fluid hydration -Encouraging oral intake -Adding appetite stimulant -Megace   Lab Results  Component Value Date   CREATININE 1.68 (H) 03/21/2024   CREATININE 2.23 (H) 03/20/2024   CREATININE 1.64 (H) 03/12/2024      Stage IV adenocarcinoma of lung, right (HCC) - Stage IV metastatic adeno carcinoma of the lung -Under chemotherapy with Dr. Sherrod -He is to follow-up as an outpatient for continued treatment  Hyponatremia -Improving Current serum sodium level at 131 >>> 135  will continue with IV fluid hydration, will monitor closely Likely due to dehydration, poor p.o. intake  History of pulmonary embolism History of pulmonary embolism in March 2025-was on Eliquis , subsequently led to bleeding, with her thrombocytopenia-platelets 62 Therefore anticoagulation was discontinued.      Type 2 diabetes mellitus with hyperglycemia (HCC) - Last A1c 5.6 -Currently not on any diabetic medications -Will check CBG q. ACHS, SSI coverage  Chronic pain -as needed analgesics  S/P lobectomy of lung - Stable under chemo, radiation therapy with her oncologist  Pancytopenia (HCC)-anemia of chronic disease, microcytic anemia/  -  Acute on chronic pancytopenia under chemotherapy    Latest Ref Rng & Units 03/21/2024    8:52 AM 03/21/2024    4:21 AM 03/20/2024   12:50 PM  CBC  WBC 4.0 - 10.5 K/uL  3.3  3.8   Hemoglobin 12.0 - 15.0 g/dL 7.9  7.5  8.6   Hematocrit 36.0 - 46.0 % 23.3  22.2  24.7   Platelets 150 - 400 K/uL  62  74    Thrombocytopenia -avoiding anticoagulants -Obtaining Hemoccult  -Checking iron  levels, folate and B12 No signs of bleeding    Component Value Date/Time   IRON  33 03/21/2024 0852   TIBC 149 (L) 03/21/2024 0852   FERRITIN 8 (L) 10/08/2019 0952   IRONPCTSAT 22 03/21/2024 0852     Port-A-Cath in place -  Will access port as needed  Metastatic cancer to brain [facility]) - Follow-up with oncologist -Consulting palliative care  recurrent/metastatic non-small cell lung cancer, adenocarcinoma with metastatic adenopathy in the thorax as well as mediastinum, axillary lymph nodes bilateral hypermetabolic adrenal metastasis and hypermetabolic bone metastasis to the T11 vertebral body as well as brain metastasis diagnosed in December 2024. S/p radiation and chemotherapy  Currently on Krazati  (Adagrasib ) 600 mg p.o. twice daily started on 01/23/2024.    GERD (gastroesophageal reflux disease) -continue PPI   Insomnia -continue trazodone   H/o Essential hypertension -BP stable-no meds  Pure hypercholesterolemia -per patient intolerance to statins (elevated AST)  H/o Depression -stable mood-not on any medication    Generalized weakness/severe debility/falls - Consult PT OT - Patient is amendable for placement, SNF     ----------------------------------------------------------------------------------------------------------------------------------------------- Nutritional status:  The patient's BMI is: Body mass index is 21.79 kg/m. I agree with the assessment and plan as outlined ------------------------------------------------------------------------------------------------------------------------------------------------  DVT prophylaxis:  Place and maintain sequential compression device Start: 03/21/24 0820 SCDs Start: 03/20/24 1404   Code Status:   Code Status: Full Code  Family Communication: No family member present at bedside-  -Advance care planning has been discussed.   Admission status:   Status is: Inpatient Remains inpatient appropriate because: Needing IV fluids, evaluation for pancytopenia, generalized weaknesses, PT OT, TOC   Disposition: From  - home             Planning for discharge in 2 days SNF   Procedures:   No admission procedures for hospital  encounter.   Antimicrobials:  Anti-infectives (From admission, onward)    None        Medication:   adagrasib   400 mg Oral BID   bisacodyl   5 mg Oral Daily   Chlorhexidine  Gluconate Cloth  6 each Topical Daily   escitalopram   10 mg Oral Daily   folic acid   1 mg Oral Daily   insulin  aspart  0-6 Units Subcutaneous TID WC   loperamide   2 mg Oral Daily   megestrol   400 mg Oral Daily   pantoprazole   40 mg Oral Daily   sodium chloride  flush  10-40 mL Intracatheter Q12H   sodium chloride  flush  3 mL Intravenous Q12H   sodium chloride  flush  3 mL Intravenous Q12H    acetaminophen  **OR** acetaminophen , hydrALAZINE , HYDROmorphone  (DILAUDID ) injection, ipratropium, levalbuterol , lidocaine , magic mouthwash, ondansetron  **OR** ondansetron  (ZOFRAN ) IV, oxyCODONE , senna-docusate, traZODone    Objective:   Vitals:   03/20/24 1547 03/20/24 2113 03/21/24 0133 03/21/24 0529  BP: 131/84 (!) 104/43 101/68 (!) 120/45  Pulse: 75 64 (!) 51 61  Resp: 20 20 18 18   Temp: 98 F (36.7 C) 98.6 F (37  C) 98.8 F (37.1 C) 98 F (36.7 C)  TempSrc: Oral Oral Oral Oral  SpO2: 100% 98% 95% 98%  Weight: 62.1 kg   63.1 kg  Height: 5' 7 (1.702 m)       Intake/Output Summary (Last 24 hours) at 03/21/2024 1129 Last data filed at 03/21/2024 0800 Gross per 24 hour  Intake 1580.34 ml  Output 100 ml  Net 1480.34 ml   Filed Weights   03/20/24 1203 03/20/24 1547 03/21/24 0529  Weight: 61.2 kg 62.1 kg 63.1 kg     Physical examination:   General:  AAO x 3,  cooperative, no distress;   HEENT:  Normocephalic, PERRL, otherwise with in Normal limits   Neuro:  CNII-XII intact. , normal motor and sensation, reflexes intact   Lungs:   Clear to auscultation BL, Respirations unlabored,  No wheezes / crackles  Cardio:    S1/S2, RRR, No murmure, No Rubs or Gallops   Abdomen:  Soft, non-tender, bowel sounds active all four quadrants, no guarding or peritoneal signs.  Muscular  skeletal:  Limited exam  -global generalized weaknesses - in bed, able to move all 4 extremities,   2+ pulses,  symmetric, No pitting edema  Skin:  Dry, warm to touch, negative for any Rashes,  Wounds: Please see nursing documentation     ------------------------------------------------------------------------------------------------------------------------------------------    LABs:     Latest Ref Rng & Units 03/21/2024    8:52 AM 03/21/2024    4:21 AM 03/20/2024   12:50 PM  CBC  WBC 4.0 - 10.5 K/uL  3.3  3.8   Hemoglobin 12.0 - 15.0 g/dL 7.9  7.5  8.6   Hematocrit 36.0 - 46.0 % 23.3  22.2  24.7   Platelets 150 - 400 K/uL  62  74       Latest Ref Rng & Units 03/21/2024    4:21 AM 03/20/2024   12:50 PM 03/12/2024    3:34 PM  CMP  Glucose 70 - 99 mg/dL 879  872  787   BUN 8 - 23 mg/dL 29  32  28   Creatinine 0.44 - 1.00 mg/dL 8.31  7.76  8.35   Sodium 135 - 145 mmol/L 135  131  131   Potassium 3.5 - 5.1 mmol/L 4.2  4.0  4.5   Chloride 98 - 111 mmol/L 110  102  98   CO2 22 - 32 mmol/L 21  21  22    Calcium  8.9 - 10.3 mg/dL 7.6  7.9  8.2   Total Protein 6.5 - 8.1 g/dL  5.0  5.5   Total Bilirubin 0.0 - 1.2 mg/dL  0.8  0.6   Alkaline Phos 38 - 126 U/L  115  80   AST 15 - 41 U/L  50  42   ALT 0 - 44 U/L  19  26        Micro Results No results found for this or any previous visit (from the past 240 hours).  Radiology Reports No results found.  SIGNED: Adriana DELENA Grams, MD, FHM. FAAFP. Jolynn Pack - [facility] Time spent - 55 min.  In seeing, evaluating and examining the patient. Reviewing medical records, labs, drawn plan of care. [facility],  Pager (please use [email or web address] to page/ text) Please use Epic Secure Chat for non-urgent communication (7AM-7PM)  If 7PM-7AM, please contact night-coverage [email or web address], 03/21/2024, 11:29 AM

## 2024-03-21 NOTE — Progress Notes (Signed)
 Initial Nutrition Assessment  DOCUMENTATION CODES:   Not applicable  INTERVENTION:   Boost Breeze po TID, each supplement provides 250 kcal and 9 grams of protein  MVI po daily  Thiamine  100mg  po daily x 7 days   Pt at high refeed risk; recommend monitor potassium, magnesium  and phosphorus labs daily until stable  Daily weights   NUTRITION DIAGNOSIS:   Unintentional weight loss related to cancer and cancer related treatments as evidenced by 16 percent weight loss in 3 months.  GOAL:   Patient will meet greater than or equal to 90% of their needs  MONITOR:   PO intake, Supplement acceptance, Labs, Weight trends, I & O's, Skin  REASON FOR ASSESSMENT:   Consult Assessment of nutrition requirement/status  ASSESSMENT:   68 y/o female with history of metastatic non-small lung cancer s/p chemoradition and lobectomy now on oral chemotherapy, HTN, HLD, DM 2, GERD, pancytopenia, bilateral PE (was on Eliquis ) with subsequent complication of bleeding, depression, chronic pain and thyroid  disease who is admitted with AKI, weakness, hyponatremia and poor oral intake.  RD working remotely.  Pt reports poor appetite and oral intake for 7 days pta. Pt with poor appetite and oral intake in hospital; pt documented to be eating 25% of meals. Pt previously followed by the RD at the [facility]. Pt does not like milky supplements. Per chart, pt is down 27lbs(16%) over the past 3 months; this is significant weight loss. RD will add supplements and vitamins to help pt meet her estimated needs. Agree with regular diet. Pt is at high refeed risk. RD will obtain history and exam at follow up. Pt is at high risk for malnutrition.   Medications reviewed and include: dulcolax, folic acid , insulin , imodium , megace , protonix , NaCl @100ml /hr  Labs reviewed: K 4.2 wnl, BUN 29(H), creat 1.68(H) P 3.4 wnl, Mg 1.7 wnl- 8/22 Iron  33, TIB 149(L), folate 7.8 wnl, B12 787 Wbc- 3.3(L), Hgb 7.9(L), Hct  23.3(L) Cbgs- 156, 126 x 24 hrs  AIC 5.6- 7/28  NUTRITION - FOCUSED PHYSICAL EXAM: RD unable to perform at this time   Diet Order:   Diet Order             Diet regular Room service appropriate? Yes; Fluid consistency: Thin  Diet effective now                  EDUCATION NEEDS:   Not appropriate for education at this time  Skin:  Skin Assessment: Reviewed RN Assessment (ecchymosis)  Last BM:  pta  Height:   Ht Readings from Last 1 Encounters:  03/20/24 5' 7 (1.702 m)    Weight:   Wt Readings from Last 1 Encounters:  03/21/24 63.1 kg    Ideal Body Weight:  61.36 kg  BMI:  Body mass index is 21.79 kg/m.  Estimated Nutritional Needs:   Kcal:  1800-2100kcal/day  Protein:  90-105g/day  Fluid:  1.7-1.9L/day  Augustin Shams MS, RD, LDN If unable to be reached, please send secure chat to RD inpatient available from 8:00a-4:00p daily

## 2024-03-22 DIAGNOSIS — N179 Acute kidney failure, unspecified: Secondary | ICD-10-CM | POA: Diagnosis not present

## 2024-03-22 LAB — CBC
HCT: 23.3 % — ABNORMAL LOW (ref 36.0–46.0)
Hemoglobin: 7.9 g/dL — ABNORMAL LOW (ref 12.0–15.0)
MCH: 29.5 pg (ref 26.0–34.0)
MCHC: 33.9 g/dL (ref 30.0–36.0)
MCV: 86.9 fL (ref 80.0–100.0)
Platelets: 64 K/uL — ABNORMAL LOW (ref 150–400)
RBC: 2.68 MIL/uL — ABNORMAL LOW (ref 3.87–5.11)
RDW: 13.5 % (ref 11.5–15.5)
WBC: 3.1 K/uL — ABNORMAL LOW (ref 4.0–10.5)
nRBC: 0 % (ref 0.0–0.2)

## 2024-03-22 LAB — BASIC METABOLIC PANEL WITH GFR
Anion gap: 8 (ref 5–15)
BUN: 24 mg/dL — ABNORMAL HIGH (ref 8–23)
CO2: 19 mmol/L — ABNORMAL LOW (ref 22–32)
Calcium: 7.7 mg/dL — ABNORMAL LOW (ref 8.9–10.3)
Chloride: 109 mmol/L (ref 98–111)
Creatinine, Ser: 1.57 mg/dL — ABNORMAL HIGH (ref 0.44–1.00)
GFR, Estimated: 35 mL/min — ABNORMAL LOW (ref >60)
Glucose, Bld: 100 mg/dL — ABNORMAL HIGH (ref 70–99)
Potassium: 4 mmol/L (ref 3.5–5.1)
Sodium: 136 mmol/L (ref 135–145)

## 2024-03-22 LAB — GLUCOSE, CAPILLARY
Glucose-Capillary: 108 mg/dL — ABNORMAL HIGH (ref 70–99)
Glucose-Capillary: 202 mg/dL — ABNORMAL HIGH (ref 70–99)
Glucose-Capillary: 187 mg/dL — ABNORMAL HIGH (ref 70–99)
Glucose-Capillary: 145 mg/dL — ABNORMAL HIGH (ref 70–99)

## 2024-03-22 LAB — URINE CULTURE

## 2024-03-22 LAB — MAGNESIUM: Magnesium: 1.6 mg/dL — ABNORMAL LOW (ref 1.7–2.4)

## 2024-03-22 LAB — PHOSPHORUS: Phosphorus: 2.6 mg/dL (ref 2.5–4.6)

## 2024-03-22 NOTE — TOC CM/SW Note (Signed)
 PT eval complete. Referrals sent to [facility] and [facility]. Pt has traditional Medicare, admitted 03/20/2024.

## 2024-03-22 NOTE — Plan of Care (Signed)
  Problem: Skin Integrity: Goal: Risk for impaired skin integrity will decrease Outcome: Progressing   Problem: Tissue Perfusion: Goal: Adequacy of tissue perfusion will improve Outcome: Progressing   Problem: Health Behavior/Discharge Planning: Goal: Ability to manage health-related needs will improve Outcome: Progressing   Problem: Clinical Measurements: Goal: Ability to maintain clinical measurements within normal limits will improve Outcome: Progressing Goal: Will remain free from infection Outcome: Progressing Goal: Diagnostic test results will improve Outcome: Progressing   Problem: Activity: Goal: Risk for activity intolerance will decrease Outcome: Progressing

## 2024-03-22 NOTE — Plan of Care (Signed)
  Problem: Acute Rehab PT Goals(only PT should resolve) Goal: Pt Will Go Supine/Side To Sit Outcome: Progressing Flowsheets (Taken 03/22/2024 0925) Pt will go Supine/Side to Sit: with supervision Goal: Patient Will Transfer Sit To/From Stand Outcome: Progressing Flowsheets (Taken 03/22/2024 0925) Patient will transfer sit to/from stand: with supervision Goal: Pt Will Transfer Bed To Chair/Chair To Bed Outcome: Progressing Flowsheets (Taken 03/22/2024 0925) Pt will Transfer Bed to Chair/Chair to Bed: with supervision Goal: Pt Will Ambulate Outcome: Progressing Flowsheets (Taken 03/22/2024 0925) Pt will Ambulate:  50 feet  with contact guard assist  with rolling walker   9:26 AM, 03/22/24 Rosaria Settler, PT, DPT [place] with [facility]

## 2024-03-22 NOTE — Evaluation (Signed)
 Physical Therapy Evaluation Patient Details Name: GLENYS SNADER MRN: [id number] DOB: 1956/07/09 Today's Date: 03/22/2024  History of Present Illness  DALEISA HALPERIN is a 68 year old Female with history of metastatic non-small lung cancer on oral chemotherapy, also history of HTN, HLD, DM 2, GERD, pancytopenia, bilateral PE (was on Eliquis ) with subsequent complication of bleeding..  Presenting with progressive generalized weaknesses, poor p.o. intake for past 7 days.  Patient and her weaknesses per family has gotten worse unable to carry her ADLs.  Associated with recurrent falls with no traumatic injuries.   Clinical Impression  Patient agreeable to PT evaluation. Patient was received supine in bed. On this date, patient required min assistance for bed mobility, and min/CGA with functional transfers and ambulation with RW. Patient is most limited due to general weakness, decreased activity tolerance, and impaired balance. Patient reports she lives alone, only has available assistance intermittently, uses RW for mobility in home, and has had at least 10 falls in the last 6 months. For patient safety, patient will benefit from continued skilled physical therapy acutely and in recommended venue in order to address current deficits prior to returning home. Patient tolerated sitting in recliner at end of session, chair alarm activated, and call button within reach.          If plan is discharge home, recommend the following: A little help with walking and/or transfers;A little help with bathing/dressing/bathroom;Assist for transportation;Help with stairs or ramp for entrance   Can travel by private vehicle        Equipment Recommendations None recommended by PT  Recommendations for Other Services       Functional Status Assessment Patient has had a recent decline in their functional status and demonstrates the ability to make significant improvements in function in a reasonable and  predictable amount of time.     Precautions / Restrictions Precautions Precautions: Fall Recall of Precautions/Restrictions: Intact Restrictions Weight Bearing Restrictions Per Provider Order: No      Mobility  Bed Mobility Overal bed mobility: Needs Assistance Bed Mobility: Supine to Sit     Supine to sit: Min assist, Used rails     General bed mobility comments: HOB flat, slow labored movement, min A via pull to sit and use of railings due to weakness    Transfers Overall transfer level: Needs assistance Equipment used: Rolling walker (2 wheels) Transfers: Sit to/from Stand, Bed to chair/wheelchair/BSC Sit to Stand: Min assist   Step pivot transfers: Contact guard assist, Min assist       General transfer comment: min assist through gait belt due to LE weakness for STS from recliner, pt demo improvements with verbal cueing to push through BUE to stand, pt demo mod forward lean when transferring bed>recliner without AD using recliner's arm rest and bed rails for support    Ambulation/Gait Ambulation/Gait assistance: Contact guard assist, Min assist Gait Distance (Feet): 35 Feet Assistive device: Rolling walker (2 wheels) Gait Pattern/deviations: Step-through pattern, Decreased step length - right, Decreased step length - left, Knee flexed in stance - left, Knee flexed in stance - right, Narrow base of support, Trunk flexed Gait velocity: Dec     General Gait Details: Pt demo slow labored movement with the above deviations, mostly CGA for unsteadiness, multiple steps taken to perform turns, demo mild SOB at end of trial, reports 8/10 fatigue  Stairs            Wheelchair Mobility     Tilt Bed  Modified Rankin (Stroke Patients Only)       Balance Overall balance assessment: Needs assistance Sitting-balance support: No upper extremity supported, Feet supported Sitting balance-Leahy Scale: Good Sitting balance - Comments: Seated EOB   Standing  balance support: Bilateral upper extremity supported, During functional activity Standing balance-Leahy Scale: Fair Standing balance comment: w/ RW       Pertinent Vitals/Pain Pain Assessment Pain Assessment: No/denies pain    Home Living Family/patient expects to be discharged to:: Private residence Living Arrangements: Alone Available Help at Discharge: Friend(s);Available PRN/intermittently Type of Home: House Home Access: Ramped entrance       Home Layout: Two level;Able to live on main level with bedroom/bathroom Home Equipment: Rolling Walker (2 wheels);Grab bars - toilet;Grab bars - tub/shower      Prior Function Prior Level of Function : Independent/Modified Independent;History of Falls (last six months)       Mobility Comments: Pt reports as household ambulator with RW. Reports she does not do much ambulation outside home, reports at least 10 falls in last 6 months, does not drive, retired 6 years ago ADLs Comments: Reports independence with ADLs, receives assist from friends for iADLs.     Extremity/Trunk Assessment   Upper Extremity Assessment Upper Extremity Assessment: Generalized weakness (Shoulder flexion AROM ~120, PROM to normal limits without pain. MMT 3+/5, all bilaterally)    Lower Extremity Assessment Lower Extremity Assessment: Generalized weakness (MMT: Ankle DF 4+/5 bilat, hip flexion 3+/5 bilat)    Cervical / Trunk Assessment Cervical / Trunk Assessment: Kyphotic  Communication   Communication Communication: No apparent difficulties    Cognition Arousal: Alert Behavior During Therapy: WFL for tasks assessed/performed   PT - Cognitive impairments: No apparent impairments     Following commands: Intact       Cueing Cueing Techniques: Verbal cues, Tactile cues, Visual cues     General Comments      Exercises     Assessment/Plan    PT Assessment Patient needs continued PT services;All further PT needs can be met in the next  venue of care  PT Problem List Decreased strength;Decreased range of motion;Decreased activity tolerance;Decreased balance;Decreased mobility       PT Treatment Interventions DME instruction;Balance training;Gait training;Functional mobility training;Therapeutic activities;Therapeutic exercise;Patient/family education    PT Goals (Current goals can be found in the Care Plan section)  Acute Rehab PT Goals Patient Stated Goal: Return home following rehab stay PT Goal Formulation: With patient Time For Goal Achievement: 04/06/24 Potential to Achieve Goals: Good    Frequency Min 3X/week     Co-evaluation               AM-PAC PT 6 Clicks Mobility  Outcome Measure Help needed turning from your back to your side while in a flat bed without using bedrails?: A Little Help needed moving from lying on your back to sitting on the side of a flat bed without using bedrails?: A Little Help needed moving to and from a bed to a chair (including a wheelchair)?: A Little Help needed standing up from a chair using your arms (e.g., wheelchair or bedside chair)?: A Little Help needed to walk in hospital room?: A Little Help needed climbing 3-5 steps with a railing? : A Lot 6 Click Score: 17    End of Session Equipment Utilized During Treatment: Gait belt Activity Tolerance: Patient tolerated treatment well;Patient limited by fatigue Patient left: in chair;with call bell/phone within reach;with chair alarm set   PT Visit Diagnosis: Unsteadiness on  feet (R26.81);Other abnormalities of gait and mobility (R26.89);Repeated falls (R29.6);Muscle weakness (generalized) (M62.81);Difficulty in walking, not elsewhere classified (R26.2)    Time: 9155-9099 PT Time Calculation (min) (ACUTE ONLY): 16 min   Charges:   PT Evaluation $PT Eval Low Complexity: 1 Low   PT General Charges $$ ACUTE PT VISIT: 1 Visit        9:24 AM, 03/22/24 Rosaria Settler, PT, DPT [place] with [facility]

## 2024-03-22 NOTE — Progress Notes (Signed)
 PROGRESS NOTE    Patient: [name]                            PCP: Regino Slater, MD                    DOB: 04/29/1956            DOA: 03/20/2024 FMW:[id number]             DOS: 03/22/2024, 11:42 AM   LOS: 2 days   Date of Service: The patient was seen and examined on 03/22/2024  Subjective:   The patient was seen and examined this morning, stable no acute distress. Still complaining of generalized weaknesses Stating improved p.o. intake.   Brief Narrative:   [name] is a 68 year old Female with history of metastatic non-small lung cancer on oral chemotherapy, also history of HTN, HLD, DM 2, GERD, pancytopenia, bilateral PE (was on Eliquis ) with subsequent complication of bleeding..  Presenting with progressive generalized weaknesses, poor p.o. intake for past 7 days.  Patient and her weaknesses per family has gotten worse unable to carry her ADLs.  Associated with recurrent falls with no traumatic injuries.   ED evaluation: Blood pressure (!) 102/46, pulse 69, temperature 97.6 F (36.4 C), temperature source Oral, resp. rate 16, height 5' 7 (1.702 m), weight 61.2 kg, SpO2 100%.  LABs: Sodium 131 CO2 21 glucose 127 BUN 32, creatinine 2.23, calcium  7.9, albumin  2.4, uric acid 10.3 AST 50, ALT 19, WBC 3.8, hemoglobin 8.6, hematocrit 27.7, platelets 74, A1c 5.6,  Requested for the patient to be admitted from progressive weakness, exacerbated by dehydration, AKI, failure to thrive.    Assessment & Plan:   Principal Problem:   AKI (acute kidney injury) (HCC) Active Problems:   Stage IV adenocarcinoma of lung, right (HCC)   Depression   Microcytic anemia   Pure hypercholesterolemia   History of pulmonary embolism   Hyponatremia   S/P lobectomy of lung   Chronic pain   Type 2 diabetes mellitus with hyperglycemia (HCC)   Essential hypertension   Insomnia   GERD (gastroesophageal reflux disease)   Metastatic cancer to brain (HCC)   Port-A-Cath in place    Pancytopenia (HCC)     Assessment and Plan: * AKI (acute kidney injury) (HCC) - monitoring, continue to improve  Likely due to dehydration, poor p.o. intake - IV fluid hydration-discontinue - Encouraging oral intake -added -Megace   Lab Results  Component Value Date   CREATININE 1.57 (H) 03/22/2024   CREATININE 1.68 (H) 03/21/2024   CREATININE 2.23 (H) 03/20/2024      Stage IV adenocarcinoma of lung, right (HCC) - Stage IV metastatic adeno carcinoma of the lung -Under chemotherapy with Dr. Sherrod -He is to follow-up as an outpatient for continued treatment  Hyponatremia - Continue to improve, discontinue IV fluid Current serum sodium level at 131 >>> 135, 136   will continue with IV fluid hydration, will monitor closely Likely due to dehydration, poor p.o. intake  History of pulmonary embolism History of pulmonary embolism in March 2025-was on Eliquis , subsequently led to bleeding, with her thrombocytopenia-platelets 62 Therefore anticoagulation was discontinued.    Type 2 diabetes mellitus with hyperglycemia (HCC) - Last A1c 5.6 -Currently not on any diabetic medications -Will check CBG q. ACHS, SSI coverage - Monitoring CBG   Chronic pain -as needed analgesics  S/P lobectomy of lung - Stable under chemo, radiation  therapy with her oncologist  Pancytopenia (HCC)-anemia of chronic disease, microcytic anemia/  - Acute on chronic pancytopenia under chemotherapy    Latest Ref Rng & Units 03/22/2024    3:38 AM 03/21/2024    8:52 AM 03/21/2024    4:21 AM  CBC  WBC 4.0 - 10.5 K/uL 3.1   3.3   Hemoglobin 12.0 - 15.0 g/dL 7.9  7.9  7.5   Hematocrit 36.0 - 46.0 % 23.3  23.3  22.2   Platelets 150 - 400 K/uL 64   62    Thrombocytopenia -avoiding anticoagulants -Obtaining Hemoccult  - follow iron  level within normal limits, continue folate No signs of bleeding    Component Value Date/Time   IRON  33 03/21/2024 0852   TIBC 149 (L) 03/21/2024 0852   FERRITIN 8 (L)  10/08/2019 0952   IRONPCTSAT 22 03/21/2024 0852     Port-A-Cath in place - Will access port as needed  Metastatic cancer to brain [facility]) - Follow-up with oncologist -Consulting palliative care  recurrent/metastatic non-small cell lung cancer, adenocarcinoma with metastatic adenopathy in the thorax as well as mediastinum, axillary lymph nodes bilateral hypermetabolic adrenal metastasis and hypermetabolic bone metastasis to the T11 vertebral body as well as brain metastasis diagnosed in December 2024. S/p radiation and chemotherapy  Currently on Krazati  (Adagrasib ) 600 mg p.o. twice daily started on 01/23/2024.    GERD (gastroesophageal reflux disease) -continue PPI   Insomnia -continue trazodone   H/o Essential hypertension -BP stable-no meds  Pure hypercholesterolemia -per patient intolerance to statins (elevated AST)  H/o Depression -stable mood-not on any medication    Generalized weakness/severe debility/falls - Consult PT OT - Patient is amendable for placement, SNF     ----------------------------------------------------------------------------------------------------------------------------------------------- Nutritional status:  The patient's BMI is: Body mass index is 21.79 kg/m. I agree with the assessment and plan as outlined ------------------------------------------------------------------------------------------------------------------------------------------------  DVT prophylaxis:  Place and maintain sequential compression device Start: 03/21/24 0820 SCDs Start: 03/20/24 1404   Code Status:   Code Status: Full Code  Family Communication: No family member present at bedside-  -Advance care planning has been discussed.   Admission status:   Status is: Inpatient Remains inpatient appropriate because: Needing IV fluids, evaluation for pancytopenia, generalized weaknesses, PT OT, TOC   Disposition: From  - home             Planning for discharge in 1  days SNF  (likely Monday, 03/23/2024)  Procedures:   No admission procedures for hospital encounter.   Antimicrobials:  Anti-infectives (From admission, onward)    None        Medication:   adagrasib   400 mg Oral BID   bisacodyl   5 mg Oral Daily   Chlorhexidine  Gluconate Cloth  6 each Topical Daily   escitalopram   10 mg Oral Daily   feeding supplement  1 Container Oral TID BM   folic acid   1 mg Oral Daily   insulin  aspart  0-6 Units Subcutaneous TID WC   loperamide   2 mg Oral Daily   megestrol   400 mg Oral Daily   multivitamin with minerals  1 tablet Oral Daily   pantoprazole   40 mg Oral Daily   sodium chloride  flush  10-40 mL Intracatheter Q12H   sodium chloride  flush  3 mL Intravenous Q12H   thiamine   100 mg Oral Daily    acetaminophen  **OR** acetaminophen , HYDROmorphone  (DILAUDID ) injection, ipratropium, levalbuterol , lidocaine , magic mouthwash, ondansetron  **OR** ondansetron  (ZOFRAN ) IV, oxyCODONE , senna-docusate, traZODone    Objective:   Vitals:  03/21/24 1319 03/21/24 1910 03/22/24 0339 03/22/24 0500  BP: (!) 102/59 124/73 117/70   Pulse: (!) 58 67    Resp: 17 18 18    Temp: 98.5 F (36.9 C) 97.6 F (36.4 C) 97.9 F (36.6 C)   TempSrc:  Oral Oral   SpO2: 97% 97% 96%   Weight:    63.1 kg  Height:        Intake/Output Summary (Last 24 hours) at 03/22/2024 1142 Last data filed at 03/22/2024 0800 Gross per 24 hour  Intake 546 ml  Output --  Net 546 ml   Filed Weights   03/20/24 1547 03/21/24 0529 03/22/24 0500  Weight: 62.1 kg 63.1 kg 63.1 kg     Physical examination:     General:  AAO x 3,  cooperative, no distress;   HEENT:  Normocephalic, PERRL, otherwise with in Normal limits   Neuro:  CNII-XII intact. , normal motor and sensation, reflexes intact   Lungs:   Clear to auscultation BL, Respirations unlabored,  No wheezes / crackles  Cardio:    S1/S2, RRR, No murmure, No Rubs or Gallops   Abdomen:  Soft, non-tender, bowel sounds active all  four quadrants, no guarding or peritoneal signs.  Muscular  skeletal:  Limited exam -global generalized weaknesses - in bed, able to move all 4 extremities,   2+ pulses,  symmetric, No pitting edema  Skin:  Dry, warm to touch, negative for any Rashes,  Wounds: Please see nursing documentation          ------------------------------------------------------------------------------------------------------------------------------------------    LABs:     Latest Ref Rng & Units 03/22/2024    3:38 AM 03/21/2024    8:52 AM 03/21/2024    4:21 AM  CBC  WBC 4.0 - 10.5 K/uL 3.1   3.3   Hemoglobin 12.0 - 15.0 g/dL 7.9  7.9  7.5   Hematocrit 36.0 - 46.0 % 23.3  23.3  22.2   Platelets 150 - 400 K/uL 64   62       Latest Ref Rng & Units 03/22/2024    3:38 AM 03/21/2024    4:21 AM 03/20/2024   12:50 PM  CMP  Glucose 70 - 99 mg/dL 899  879  872   BUN 8 - 23 mg/dL 24  29  32   Creatinine 0.44 - 1.00 mg/dL 8.42  8.31  7.76   Sodium 135 - 145 mmol/L 136  135  131   Potassium 3.5 - 5.1 mmol/L 4.0  4.2  4.0   Chloride 98 - 111 mmol/L 109  110  102   CO2 22 - 32 mmol/L 19  21  21    Calcium  8.9 - 10.3 mg/dL 7.7  7.6  7.9   Total Protein 6.5 - 8.1 g/dL   5.0   Total Bilirubin 0.0 - 1.2 mg/dL   0.8   Alkaline Phos 38 - 126 U/L   115   AST 15 - 41 U/L   50   ALT 0 - 44 U/L   19        Micro Results Recent Results (from the past 240 hours)  Urine Culture     Status: Abnormal   Collection Time: 03/20/24  8:00 PM   Specimen: Urine, Random  Result Value Ref Range Status   Specimen Description   Final    URINE, RANDOM Performed at [facility], [address], [place], [place] [address]    Special Requests   Final    NONE  Reflexed from [id number] Performed at [facility], [address] [place], [place] [address]    Culture MULTIPLE SPECIES PRESENT, SUGGEST RECOLLECTION (A)  Final   Report Status 03/22/2024 FINAL  Final    Radiology Reports No results found.  SIGNED: Adriana DELENA Grams, MD, FHM. FAAFP. Jolynn Pack - [facility] Time spent - 55 min.  In seeing, evaluating and examining the patient. Reviewing medical records, labs, drawn plan of care. [facility],  Pager (please use [email or web address] to page/ text) Please use Epic Secure Chat for non-urgent communication (7AM-7PM)  If 7PM-7AM, please contact night-coverage [email or web address], 03/22/2024, 11:42 AM

## 2024-03-23 ENCOUNTER — Other Ambulatory Visit: Payer: Self-pay | Admitting: Vascular Surgery

## 2024-03-23 DIAGNOSIS — C3491 Malignant neoplasm of unspecified part of right bronchus or lung: Secondary | ICD-10-CM

## 2024-03-23 DIAGNOSIS — Z7189 Other specified counseling: Secondary | ICD-10-CM

## 2024-03-23 DIAGNOSIS — Z515 Encounter for palliative care: Secondary | ICD-10-CM | POA: Diagnosis not present

## 2024-03-23 DIAGNOSIS — R634 Abnormal weight loss: Secondary | ICD-10-CM

## 2024-03-23 DIAGNOSIS — R112 Nausea with vomiting, unspecified: Secondary | ICD-10-CM

## 2024-03-23 DIAGNOSIS — N179 Acute kidney failure, unspecified: Secondary | ICD-10-CM | POA: Diagnosis not present

## 2024-03-23 LAB — GLUCOSE, CAPILLARY
Glucose-Capillary: 131 mg/dL — ABNORMAL HIGH (ref 70–99)
Glucose-Capillary: 153 mg/dL — ABNORMAL HIGH (ref 70–99)

## 2024-03-23 LAB — BASIC METABOLIC PANEL WITH GFR
Anion gap: 4 — ABNORMAL LOW (ref 5–15)
BUN: 25 mg/dL — ABNORMAL HIGH (ref 8–23)
CO2: 21 mmol/L — ABNORMAL LOW (ref 22–32)
Calcium: 7.7 mg/dL — ABNORMAL LOW (ref 8.9–10.3)
Chloride: 108 mmol/L (ref 98–111)
Creatinine, Ser: 1.54 mg/dL — ABNORMAL HIGH (ref 0.44–1.00)
GFR, Estimated: 36 mL/min — ABNORMAL LOW (ref >60)
Glucose, Bld: 133 mg/dL — ABNORMAL HIGH (ref 70–99)
Potassium: 4.4 mmol/L (ref 3.5–5.1)
Sodium: 133 mmol/L — ABNORMAL LOW (ref 135–145)

## 2024-03-23 LAB — CBC
HCT: 22.9 % — ABNORMAL LOW (ref 36.0–46.0)
Hemoglobin: 7.9 g/dL — ABNORMAL LOW (ref 12.0–15.0)
MCH: 29.7 pg (ref 26.0–34.0)
MCHC: 34.5 g/dL (ref 30.0–36.0)
MCV: 86.1 fL (ref 80.0–100.0)
Platelets: 62 K/uL — ABNORMAL LOW (ref 150–400)
RBC: 2.66 MIL/uL — ABNORMAL LOW (ref 3.87–5.11)
RDW: 13.9 % (ref 11.5–15.5)
WBC: 2.7 K/uL — ABNORMAL LOW (ref 4.0–10.5)
nRBC: 0 % (ref 0.0–0.2)

## 2024-03-23 LAB — PHOSPHORUS: Phosphorus: 2.5 mg/dL (ref 2.5–4.6)

## 2024-03-23 LAB — MAGNESIUM: Magnesium: 1.5 mg/dL — ABNORMAL LOW (ref 1.7–2.4)

## 2024-03-23 MED ORDER — MAGNESIUM SULFATE 2 GM/50ML IV SOLN
2.0000 g | Freq: Once | INTRAVENOUS | Status: AC
  Administered 2024-03-23: 2 g via INTRAVENOUS
  Filled 2024-03-23: qty 50

## 2024-03-23 MED ORDER — MEGESTROL ACETATE 400 MG/10ML PO SUSP: 400.0000 mg | Freq: Every day | ORAL | 0 refills | Status: DC

## 2024-03-23 MED ORDER — ESCITALOPRAM OXALATE 10 MG PO TABS
10.0000 mg | ORAL_TABLET | Freq: Every day | ORAL | 1 refills | Status: DC
Start: 2024-03-24 — End: 2024-05-20

## 2024-03-23 MED ORDER — MEGESTROL ACETATE 40 MG PO TABS: 320.0000 mg | ORAL_TABLET | Freq: Every day | ORAL | 0 refills | Status: DC

## 2024-03-23 MED ORDER — HEPARIN SOD (PORK) LOCK FLUSH 100 UNIT/ML IV SOLN
500.0000 [IU] | INTRAVENOUS | Status: DC | PRN
  Filled 2024-03-23: qty 5

## 2024-03-23 MED ORDER — MEGESTROL ACETATE 40 MG PO TABS
320.0000 mg | ORAL_TABLET | Freq: Every day | ORAL | Status: DC
  Filled 2024-03-23 (×2): qty 8

## 2024-03-23 MED ORDER — VITAMIN B-1 100 MG PO TABS
100.0000 mg | ORAL_TABLET | Freq: Every day | ORAL | 1 refills | Status: DC
Start: 2024-03-24 — End: 2024-04-06

## 2024-03-23 NOTE — TOC Transition Note (Signed)
 Transition of Care [facility]) - Discharge Note   Patient Details  Name: [name] MRN: [id number] Date of Birth: [birth year]  Transition [facility] CM/SW Contact:  Hoy DELENA Bigness, LCSW Phone Number: 03/23/2024, 11:41 AM   Clinical Narrative:    Pt accepted bed offer for STR at [facility]. Pt to bring supply of Krazati  with her as SNF unable to obtain. Pt will be going to semi-private room at [phone]. RN to call report to [phone]. Pt's friend to provide transportation for pt at discharge. RN made aware.    Final next level of care: Skilled Nursing Facility Barriers to Discharge: Barriers Resolved   Patient Goals and CMS Choice Patient states their goals for this hospitalization and ongoing recovery are:: Strengthening CMS Medicare.gov Compare Post Acute Care list provided to:: Patient Choice offered to / list presented to : Patient [place] ownership interest in [facility].provided to:: Patient    Discharge Placement PASRR number recieved: 03/20/24            Patient chooses bed at: [facility] Patient to be transferred to facility by: Friend Name of family member notified: Patient and friend Patient and family notified of of transfer: 03/23/24  Discharge Plan and Services Additional resources added to the After Visit Summary for   In-house Referral: Clinical Social Work Discharge Planning Services: CM Consult Post Acute Care Choice: Skilled Nursing Facility          DME Arranged: N/A DME Agency: NA                  Social Drivers of Health (SDOH) Interventions SDOH Screenings   Food Insecurity: No Food Insecurity (03/20/2024)  Housing: Low Risk  (03/20/2024)  Transportation Needs: Unmet Transportation Needs (03/20/2024)  Utilities: Not At Risk (03/20/2024)  Alcohol Screen: Low Risk  (07/23/2023)  Depression (PHQ2-9): Low Risk  (02/05/2024)  Social Connections: Moderately Integrated (03/20/2024)  Tobacco Use: Medium Risk (03/20/2024)      Readmission Risk Interventions    03/23/2024   11:39 AM 03/20/2024    5:24 PM  Readmission Risk Prevention Plan  Transportation Screening Complete Complete  Medication Review (RN Care Manager) Complete Complete  PCP or Specialist appointment within 3-5 days of discharge Complete Complete  HRI or Home Care Consult Complete Complete  SW Recovery Care/Counseling Consult Complete Complete  Palliative Care Screening Complete Not Applicable  Skilled Nursing Facility Complete Complete

## 2024-03-23 NOTE — Consult Note (Signed)
 Consultation Note Date: 03/23/2024   Patient Name: [name]  DOB: 1955-12-02  MRN: [id number]  Age / Sex: 68 y.o., female  PCP: Regino Slater, MD Referring Physician: Willette Adriana LABOR, MD  Reason for Consultation: Establishing goals of care  HPI/Patient Profile: 68 y.o. female  with past medical history of recurrent lung cancer with mets to the brain, lymph nodes, bone, and adrenal gland, thyroid  disease, hypertension, hyperlipidemia, insomnia, GERD, type 2 diabetes, depression, pulmonary embolism unable to tolerate anticoagulation, and chronic pain admitted on 03/20/2024 with generalized weakness, recurrent falls, poor p.o. intake, and inability to complete ADLs.   Patient was admitted and ultimately diagnosed with an acute kidney injury and hyponatremia secondary to dehydration in the setting of poor oral intake.  She is under active treatment for stage IV adenocarcinoma of the lung.  She was seen by physical therapy as an inpatient and recommendations were to discharge to SNF.  Patient has been accepted at [facility] for rehab and the plan is to discharge today pending bed availability.  She continues under active treatment for her stage IV adenocarcinoma of the lung with metastasis (followed by [facility], Dr. Sherrod).  Has undergone right upper lobe lobectomy and mediastinal lymph node dissection, whole brain irradiation, systemic chemotherapy, immunotherapy, had disease progression and now on oral chemotherapy.  PMT has been consulted to assist with goals of care conversation.  Today, labs reviewed.  Mild hyponatremia with a sodium of 133 noted.  This is improved from admission.  Renal function stable/improved with creatinine 1.54, BUN 25, EGFR 36.  Admission renal function revealed creatinine 2.23, BUN 32, EGFR 23.  She has persistent hypocalcemia in the 7-8 range.  CO2 levels slightly.  Has pancytopenia WBCs, RBC/H&H, and platelet count  are all low with hemoglobin 7.9 g/dL today.  This is decreased from admission hemoglobin of 8.6 but likely dilutional given her level of dehydration.  This is in the setting of chemotherapy treatments.  MRI of the brain from 03/12/2024 reviewed.  While she has numerous enhancing intracranial lesions in the cerebral hemispheres and cerebellum overall they do appear slightly decreased in size.  Vital signs appear stable.  Medication review completed.  She remains on Megace  for appetite support.  She has not required any oxycodone  or Tylenol  on 24-hour look back.  She does continue to require Zofran  on a regular basis with her chemo meds for nausea.  Patient states she is doing better.  She denies any pain.  She does report severe nausea and vomiting when she takes her chemo pill.  Zofran  is somewhat helpful.  Clinical Assessment and Goals of Care:  I have reviewed medical records including EPIC notes, labs and imaging (independently reviewed), vital signs, medication administration record, outpatient specialty visits (specifically oncology), assessed the patient and then met with patient and her close friend to discuss diagnosis prognosis, GOC, EOL wishes, disposition and options. Collaborated directly with attending physician, bedside nursing staff, and TOC.   I introduced Palliative Medicine as specialized medical care for people living with serious illness. It focuses on providing relief from the symptoms and stress of a serious illness. The goal is to improve quality of life for both the patient and the family.  We discussed a brief life review of the patient and then focused on their current illness.   I attempted to elicit values and goals of care important to the patient.    Medical History Review and Family/Patient Understanding:   Patient and friend  have a good understanding of her current health condition.  She is aware of the seriousness of her lung cancer diagnosis.  She also understands  that she has significant weakness and weight loss and is having great difficulty managing at home.  Social History:  Lives at home alone.  She was born in [place]  and moved to [place]  in 2012.  Her friend/neighbor is very helpful and supportive.  She has 1 cat.  She is also an active member of her church and has lots of friends through her church.  She was a very active person and attended a local gym prior to becoming ill.  Functional and Nutritional State:  Normally ambulates independently but has had significantly more trouble lately.  She states that she has been having more falls, weakness, and decreased appetite.  This has been ongoing for months.  She states that the chemo pill she is taking often causes severe nausea and vomiting.  She has tried taking the medication with food and taking Imodium  and Zofran  at the same time.  Sometimes premedicating is helpful and other times she still has nausea and vomiting.  She now has depend on others to drive her and has been having trouble managing medications/meals/ADLs due to weakness.  She has also had significant weight loss over the past few months due to decreased appetite.  Palliative Symptoms:  Decreased appetite, nausea, vomiting  Goals of care/advance Directives/anticipatory care planning discussion:  A detailed discussion regarding advanced directives was had.  Spoke with patient regarding her overall health status and our concerns.  Shared that while we are hopeful she will continue to improve, we are worried that her health may deteriorate further and could do so rapidly.  Patient states that for her having a good quality of life means being able to return home and care for herself/attend church.  She states that she wants to live and get better.  Her goal is to get her strength up by going to the rehab center and return home and live independently and return to being active in her church/gym.  She states that she has completed a  living will.  She has designated her friend Control and instrumentation engineer) to be her healthcare and financial power of attorney.  Engaged in discussion of advance directives including the limitations and potential burdens of CPR and intubation, particularly in the context of those of similar age and with serious underlying health conditions.  Discussed that if her goals are to return home and live in independent life and have good quality of life, then it would be reasonable to consider DNR/DNI.  Provided education on what this means including that this does not mean do not treat.  Patient states that she will consider this.  For right now, she wishes to remain a full code/full scope.  She states that she would desire a trial of CPR/mechanical ventilation however, if prognosis is poor for a return to a functional/independent life then she would want to shift focus to comfort.  Palliative Care services outpatient were explained and offered.  Agrees to follow-up with outpatient palliative care through the cancer center.  Discussed the importance of continued conversation with family and the medical providers regarding overall plan of care and treatment options, ensuring decisions are within the context of the patient's values and GOCs.   Questions and concerns were addressed.  Hard Choices booklet left for review. The patient and friend were encouraged to call with questions or concerns.    Code Status:  Full code/full scope   PATIENT, if unable speak for herself then her healthcare power of attorney (Escojido,George)    SUMMARY OF RECOMMENDATIONS    Full code/full scope Discharge to [facility] for skilled therapy services with ultimate hope of discharging home Continue to follow-up with medical oncology for ongoing management of her lung cancer  Referral to outpatient palliative care through the cancer center for symptom management/ongoing goals of care discussion  Code Status/Advance Care Planning: Full  code   Symptom Management:  Continue Zofran  as needed nausea Continue Megace  for appetite support Continue Lexapro  for depression Continue trazodone  nightly for sleep support  Prognosis:  Unable to determine  Discharge Planning: Skilled Nursing Facility for rehab with Palliative care service follow-up      Primary Diagnoses: Present on Admission:  AKI (acute kidney injury) (HCC)  Chronic pain  Depression  Essential hypertension  GERD (gastroesophageal reflux disease)  Insomnia  Metastatic cancer to brain (HCC)  Microcytic anemia  (Resolved) Mixed hyperlipidemia  Pancytopenia (HCC)  Pure hypercholesterolemia  Stage IV adenocarcinoma of lung, right (HCC)  Type 2 diabetes mellitus with hyperglycemia (HCC)  Hyponatremia    Physical Exam Constitutional:      General: She is not in acute distress.    Appearance: She is not toxic-appearing.  Pulmonary:     Effort: Pulmonary effort is normal. No respiratory distress.  Skin:    General: Skin is warm and dry.  Neurological:     Mental Status: She is alert.     Comments: Pleasant and cooperative     Vital Signs: BP 112/66 (BP Location: Right Arm)   Pulse 64   Temp 98.5 F (36.9 C) (Oral)   Resp 18   Ht 5' 7 (1.702 m)   Wt 63.9 kg   SpO2 96%   BMI 22.06 kg/m  Pain Scale: 0-10   Pain Score: 0-No pain   SpO2: SpO2: 96 % O2 Device:SpO2: 96 % O2 Flow Rate: .    Palliative Assessment/Data: 60%     Billing based on MDM: High  Problems Addressed: One or more chronic illnesses with severe exacerbation, progression, or side effects of treatment.  Amount and/or Complexity of Data: Category 1:Review of prior external note(s) from each unique source and Category 3:Discussion of management or test interpretation with external physician/other qualified health care professional/appropriate source (not separately reported)  Risks: Decision not to resuscitate or to de-escalate care because of poor prognosis  (discussed patient elected to remain full code/full scope)   Laymon CHRISTELLA Pinal, NP  Palliative Medicine Team Team phone # [phone]  Thank you for allowing the Palliative Medicine Team to assist in the care of this patient. Please utilize secure chat with additional questions, if there is no response within 30 minutes please call the above phone number.  Palliative Medicine Team providers are available by phone from 7am to 7pm daily and can be reached through the team cell phone.  Should this patient require assistance outside of these hours, please call the patient's attending physician.

## 2024-03-23 NOTE — Discharge Summary (Addendum)
 Physician Discharge Summary   Patient: [name] MRN: [id number] DOB: Apr 10, 1956  Admit date:     03/20/2024  Discharge date: 03/23/24  Discharge Physician: Adriana DELENA Grams   PCP: Regino Slater, MD   Recommendations at discharge:  - Follow with PCP in 1-2 weeks - Follow-up with a primary oncologist Dr. Sherrod in 1-2 weeks - Follow-up with the palliative care team - Continue PT OT, fall precautions, - Continue oral hydration -CBC in 1 week results to PCP    Discharge Diagnoses: Principal Problem:   AKI (acute kidney injury) (HCC) Active Problems:   Stage IV adenocarcinoma of lung, right (HCC)   Depression   Microcytic anemia   Pure hypercholesterolemia   History of pulmonary embolism   Hyponatremia   S/P lobectomy of lung   Chronic pain   Type 2 diabetes mellitus with hyperglycemia (HCC)   Essential hypertension   Insomnia   GERD (gastroesophageal reflux disease)   Metastatic cancer to brain (HCC)   Port-A-Cath in place   Pancytopenia (HCC)  Resolved Problems:   Diabetes (HCC)   Mixed hyperlipidemia  Hospital Course: [name] is a 68 year old Female with history of metastatic non-small lung cancer on oral chemotherapy, also history of HTN, HLD, DM 2, GERD, pancytopenia, bilateral PE (was on Eliquis ) with subsequent complication of bleeding..  Presenting with progressive generalized weaknesses, poor p.o. intake for past 7 days.  Patient and her weaknesses per family has gotten worse unable to carry her ADLs.  Associated with recurrent falls with no traumatic injuries.   ED evaluation: Blood pressure (!) 102/46, pulse 69, temperature 97.6 F (36.4 C), temperature source Oral, resp. rate 16, height 5' 7 (1.702 m), weight 61.2 kg, SpO2 100%.  LABs: Sodium 131 CO2 21 glucose 127 BUN 32, creatinine 2.23, calcium  7.9, albumin  2.4, uric acid 10.3 AST 50, ALT 19, WBC 3.8, hemoglobin 8.6, hematocrit 27.7, platelets 74, A1c 5.6,  Requested for the patient to  be admitted from progressive weakness, exacerbated by dehydration, AKI, failure to thrive.   * AKI (acute kidney injury) (HCC) Likely due to dehydration, poor p.o. intake -S/p IV fluid hydration - Continue to encourage oral intake, with hydration -Adding appetite stimulant -Megace  Lab Results  Component Value Date   CREATININE 1.54 (H) 03/23/2024   CREATININE 1.57 (H) 03/22/2024   CREATININE 1.68 (H) 03/21/2024   Stage IV adenocarcinoma of lung, right (HCC) - Stage IV metastatic adeno carcinoma of the lung -Under chemotherapy with Dr. Sherrod - follow-up as an outpatient for continued treatment  Hyponatremia -Improved with oral hydration Likely due to dehydration, poor p.o. intake  History of pulmonary embolism History of pulmonary embolism in March 2025-was on Eliquis , subsequently led to bleeding, with her thrombocytopenia Therefore anticoagulation was discontinued.   Pure hypercholesterolemia Not on any statins, due to intolerance, elevated LFTs   Microcytic anemia With pancytopenia, monitoring H&H closely currently stable - B12 folate, iron  studies within normal limits, with exception of elevated ferritin- low, and TIBC Iron /TIBC/Ferritin/ %Sat    Component Value Date/Time   IRON  33 03/21/2024 0852   TIBC 149 (L) 03/21/2024 0852   FERRITIN 8 (L) 10/08/2019 0952   IRONPCTSAT 22 03/21/2024 0852  Continue folate supplements  Depression Mood stable, continue Celexa  Type 2 diabetes mellitus with hyperglycemia (HCC) - Last A1c 5.6 --Carb modified diet recommended  Chronic pain - As needed analgesics  S/P lobectomy of lung - Stable under chemo, radiation therapy with her oncologist  Pancytopenia (HCC) - Acute  on chronic pancytopenia under chemotherapy -H&H stable as needed blood transfusion with hemoglobin less than 7      Latest Ref Rng & Units 03/23/2024    4:14 AM 03/22/2024    3:38 AM 03/21/2024    8:52 AM  CBC  WBC 4.0 - 10.5 K/uL 2.7  3.1     Hemoglobin 12.0 - 15.0 g/dL 7.9  7.9  7.9   Hematocrit 36.0 - 46.0 % 22.9  23.3  23.3   Platelets 150 - 400 K/uL 62  64     CBC in 1 week  Will monitor closely  Thrombocytopenia -avoiding anticoagulants No signs of bleeding  Port-A-Cath in place - Will access port as needed  Metastatic cancer to brain [facility]) - Follow-up with oncologist - Follow-up with the palliative care team -referral made  recurrent/metastatic non-small cell lung cancer, adenocarcinoma with metastatic adenopathy in the thorax as well as mediastinum, axillary lymph nodes bilateral hypermetabolic adrenal metastasis and hypermetabolic bone metastasis to the T11 vertebral body as well as brain metastasis diagnosed in December 2024. S/p radiation and chemotherapy  Currently on Krazati  (Adagrasib ) 600 mg p.o. twice daily started on 01/23/2024.    GERD (gastroesophageal reflux disease) Reviewing home meds continue PPI  Insomnia - Continuing as needed trazodone   Essential hypertension Will review home medication -not on any BP meds -Pressure stable           Disposition: Skilled nursing facility Diet recommendation:  Discharge Diet Orders (From admission, onward)     Start     Ordered   03/23/24 0000  Diet - low sodium heart healthy        03/23/24 0956           Regular diet DISCHARGE MEDICATION: Allergies as of 03/23/2024       Reactions   Synthroid [levothyroxine Sodium] Anaphylaxis   Voltaren  [diclofenac  Sodium] Other (See Comments)   World wouldn't stop spinning NSAIDs shouldn't be taken by pt   Hydrochlorothiazide Other (See Comments)   Cramps    Iodine Rash   I get a skin rash sometimes if iodine applied to my skin.        Medication List     STOP taking these medications    methylPREDNISolone  4 MG Tbpk tablet Commonly known as: MEDROL  DOSEPAK       TAKE these medications    escitalopram  10 MG tablet Commonly known as: LEXAPRO  Take 1 tablet (10 mg total) by  mouth daily. Start taking on: March 24, 2024   folic acid  1 MG tablet Commonly known as: FOLVITE  Take 1 mg by mouth daily.   Krazati  200 MG tablet Generic drug: adagrasib  Take 3 tablets (600 mg total) by mouth 2 (two) times daily. What changed: how much to take   lidocaine  2 % solution Commonly known as: XYLOCAINE  every 6 (six) hours as needed for mouth pain. Apply topically to mouth for sores   loperamide  2 MG capsule Commonly known as: IMODIUM  Take 2 mg by mouth daily.   magic mouthwash Soln Take 5 mLs by mouth 3 (three) times daily as needed for mouth pain. Suspension contains equal amounts of Maalox Extra Strength, nystatin, and diphenhydramine .   megestrol  40 MG tablet Commonly known as: MEGACE  Take 8 tablets (320 mg total) by mouth daily.   ondansetron  8 MG tablet Commonly known as: ZOFRAN  Take 1 tablet (8 mg total) by mouth every 8 (eight) hours as needed for nausea (nausea and or vomiting). What changed: when to take this  thiamine  100 MG tablet Commonly known as: Vitamin B-1 Take 1 tablet (100 mg total) by mouth daily. Start taking on: March 24, 2024   traZODone  100 MG tablet Commonly known as: DESYREL  Take 2 tablets (200 mg total) by mouth at bedtime. What changed:  when to take this reasons to take this   triamcinolone  cream 0.1 % Commonly known as: KENALOG  Apply 1 Application topically 2 (two) times daily. Avoid use on face or private areas What changed:  when to take this reasons to take this        Discharge Exam: Filed Weights   03/21/24 0529 03/22/24 0500 03/23/24 0500  Weight: 63.1 kg 63.1 kg 63.9 kg        General:  AAO x 3,  cooperative, no distress;   HEENT:  Normocephalic, PERRL, otherwise with in Normal limits   Neuro:  CNII-XII intact. , normal motor and sensation, reflexes intact   Lungs:   Clear to auscultation BL, Respirations unlabored,  No wheezes / crackles  Cardio:    S1/S2, RRR, No murmure, No Rubs or Gallops    Abdomen:  Soft, non-tender, bowel sounds active all four quadrants, no guarding or peritoneal signs.  Muscular  skeletal:  Limited exam -global generalized weaknesses - in bed, able to move all 4 extremities,   2+ pulses,  symmetric, No pitting edema  Skin:  Dry, warm to touch, negative for any Rashes,  Wounds: Please see nursing documentation          Condition at discharge: fair  The results of significant diagnostics from this hospitalization (including imaging, microbiology, ancillary and laboratory) are listed below for reference.   Imaging Studies: MR Brain W Wo Contrast Result Date: 03/17/2024 EXAM: MRI BRAIN WITH AND WITHOUT CONTRAST 03/12/2024 05:40:28 PM TECHNIQUE: Multiplanar multisequence MRI of the head/brain was performed with and without the administration of intravenous contrast. COMPARISON: MRI head 12/06/2023. CLINICAL HISTORY: Metastatic disease evaluation; 3 T SRS Protocol. Metastatic cancer to brain [facility]). FINDINGS: BRAIN AND VENTRICLES: Numerous enhancing intracranial lesions again noted throughout the cerebral hemispheres and cerebellum. The largest lesion in the anterior right frontal lobe now measures up to 8 mm, previously 10 mm (series 1100, image 230). There are at least 20 lesions noted which are annotated on the oblique axial T1 post series 1100. These lesions are overall slightly decreased in size. There are a few previously noted punctate lesions which are no longer visualized. For example, the previously noted left occipital lesion seen on series 601, image 214 and the right frontal lesion seen on series 601, image 115 of the 12/06/2023 study are not appreciated on the current study. No new intracranial lesions. No lesions demonstrating definite enlargement. Scattered foci of restricted diffusion are decreased in conspicuity compared to prior. No evidence of acute infarct. No acute intracranial hemorrhage. There are several lesions which demonstrate decreased  surrounding T2/FLAIR hyperintensity. Similar periventricular white matter signal abnormality. ORBITS: No acute abnormality. SINUSES: No acute abnormality. BONES AND SOFT TISSUES: Normal bone marrow signal and enhancement. Bilateral mastoid effusions. No acute soft tissue abnormality. IMPRESSION: 1. Numerous enhancing intracranial lesions throughout the cerebral hemispheres and cerebellum, overall slightly decreased in size compared to prior study. No new lesions or definite enlargement. 2. Largest lesion in the anterior right frontal lobe measures up to 8 mm, previously 10 mm. Electronically signed by: Donnice Mania MD 03/17/2024 09:44 PM EDT RP Workstation: [id number]   CT CHEST ABDOMEN PELVIS WO CONTRAST Result Date: 03/17/2024 CLINICAL DATA:  Non-small cell lung  cancer, recurrence/metastatic restaging. * Tracking Code: BO * EXAM: CT CHEST, ABDOMEN AND PELVIS WITHOUT CONTRAST TECHNIQUE: Multidetector CT imaging of the chest, abdomen and pelvis was performed following the standard protocol without IV contrast. RADIATION DOSE REDUCTION: This exam was performed according to the departmental dose-optimization program which includes automated exposure control, adjustment of the mA and/or kV according to patient size and/or use of iterative reconstruction technique. COMPARISON:  Multiple priors including CT January 06, 2024. FINDINGS: CT CHEST FINDINGS Cardiovascular: Right chest Port-A-Cath with tip near the superior cavoatrial junction. Aortic atherosclerosis. Normal size heart. Stable small pericardial effusion. Mediastinum/Nodes: No suspicious thyroid  nodule. Decreased size of the subpectoral and axillary lymph nodes. Previously indexed lymph node in the right axilla now measures 17 x 9 mm on image 11/2 previously 30 x 20 mm. No suspicious thyroid  nodule.  Esophagus is grossly unremarkable. Lungs/Pleura: Similar surgical changes of right upper lobectomy without new suspicious nodularity along the suture line. Stable 2  mm left upper lobe pulmonary nodule on image 41/6. Stable 2 mm left lower lobe pulmonary nodule on image 65/6. No new suspicious pulmonary nodules or masses. Musculoskeletal: Stable sclerotic lesion in the T11 vertebral body. No new aggressive lytic or blastic lesion of bone. CT ABDOMEN PELVIS FINDINGS Hepatobiliary: No suspicious hepatic lesion on noncontrast enhanced examination. Gallbladder is unremarkable. No biliary ductal dilation. Pancreas: No pancreatic ductal dilation or evidence of acute inflammation. Pancreatic atrophy. Spleen: No splenomegaly. Hypodense splenic lesions not well seen on noncontrast enhanced examination. Adrenals/Urinary Tract: Decreased size of the bilateral adrenal nodules. For reference: -left adrenal nodule measures 12 x 11 mm on image 59/2 previously 2.6 x 2.2 cm -right adrenal nodule measures 10 x 7 mm on image 57/2 previously 2.2 x 1.3 cm. No hydronephrosis. Punctate nonobstructive left renal stone. Urinary bladder is minimally distended. Unchanged 12 mm bladder calculus. Stomach/Bowel: No radiopaque enteric contrast material was administered. Stomach is unremarkable for degree of distension. No pathologic dilation of small or large bowel. Pancolonic diverticulosis. Vascular/Lymphatic: Aortic atherosclerosis. Smooth IVC contours. No pathologically enlarged abdominal or pelvic lymph nodes. Reproductive: Status post hysterectomy. No adnexal masses. Other: No significant abdominopelvic free fluid. Musculoskeletal: Multilevel degenerative change of the spine. IMPRESSION: 1. Decreased size of the bilateral adrenal nodules and axillary/subpectoral lymph nodes. Compatible with treatment response. 2. Stable sclerotic lesion in the T11 vertebral body. 3. No evidence of new or progressive disease in the chest, abdomen or pelvis. 4. Stable tiny pulmonary nodules. 5.  Aortic Atherosclerosis (ICD10-I70.0). Electronically Signed   By: Reyes Holder M.D.   On: 03/17/2024 16:06     Microbiology: Results for orders placed or performed during the hospital encounter of 03/20/24  Urine Culture     Status: Abnormal   Collection Time: 03/20/24  8:00 PM   Specimen: Urine, Random  Result Value Ref Range Status   Specimen Description   Final    URINE, RANDOM Performed at [facility], [address] [place], [place] [address]    Special Requests   Final    NONE Reflexed from [id number] Performed at [facility], [address] [place], [place] [address]    Culture MULTIPLE SPECIES PRESENT, SUGGEST RECOLLECTION (A)  Final   Report Status 03/22/2024 FINAL  Final    Labs: CBC: Recent Labs  Lab 03/20/24 1250 03/21/24 0421 03/21/24 0852 03/22/24 0338 03/23/24 0414  WBC 3.8* 3.3*  --  3.1* 2.7*  NEUTROABS 3.0  --   --   --   --   HGB 8.6* 7.5*  7.9* 7.9* 7.9*  HCT 24.7* 22.2* 23.3* 23.3* 22.9*  MCV 86.1 87.4  --  86.9 86.1  PLT 74* 62*  --  64* 62*   Basic Metabolic Panel: Recent Labs  Lab 03/20/24 1250 03/21/24 0421 03/22/24 0338 03/23/24 0414  NA 131* 135 136 133*  K 4.0 4.2 4.0 4.4  CL 102 110 109 108  CO2 21* 21* 19* 21*  GLUCOSE 127* 120* 100* 133*  BUN 32* 29* 24* 25*  CREATININE 2.23* 1.68* 1.57* 1.54*  CALCIUM  7.9* 7.6* 7.7* 7.7*  MG 1.7  --  1.6* 1.5*  PHOS 3.4  --  2.6 2.5   Liver Function Tests: Recent Labs  Lab 03/20/24 1250  AST 50*  ALT 19  ALKPHOS 115  BILITOT 0.8  PROT 5.0*  ALBUMIN  2.4*   CBG: Recent Labs  Lab 03/22/24 1118 03/22/24 1617 03/22/24 2016 03/23/24 0711 03/23/24 1124  GLUCAP 202* 187* 145* 131* 153*    Discharge time spent: greater than 30 minutes.  Signed: Adriana DELENA Grams, MD [facility] 03/23/2024

## 2024-03-23 NOTE — Evaluation (Signed)
 Occupational Therapy Evaluation Patient Details Name: [name] MRN: [id number] DOB: 01/31/1956 Today's Date: 03/23/2024   History of Present Illness   [name] is a 68 year old Female with history of metastatic non-small lung cancer on oral chemotherapy, also history of HTN, HLD, DM 2, GERD, pancytopenia, bilateral PE (was on Eliquis ) with subsequent complication of bleeding..  Presenting with progressive generalized weaknesses, poor p.o. intake for past 7 days.  Patient and her weaknesses per family has gotten worse unable to carry her ADLs.  Associated with recurrent falls with no traumatic injuries. (per MD)     Clinical Impressions Pt agreeable to OT evaluation followed by some B UE strengthening. Pt has a history of several falls at home. Pt demonstrates CGA for ambulation int he room with RW. Set up assist needed for toileting to complete peri-care. Pt is able to reach B LE well for ADL's and is generally weak. Pt completed theraband exercises for B UE strengthening today with tactile, verbal, and visual cuing. Pt left in the chair with call bell within reach and chair alarm set. Pt will benefit from continued OT in the hospital and recommended venue below to increase strength, balance, and endurance for safe ADL's.        If plan is discharge home, recommend the following:   A little help with walking and/or transfers;A little help with bathing/dressing/bathroom;Assistance with cooking/housework;Assist for transportation;Help with stairs or ramp for entrance     Functional Status Assessment   Patient has had a recent decline in their functional status and demonstrates the ability to make significant improvements in function in a reasonable and predictable amount of time.     Equipment Recommendations   None recommended by OT             Precautions/Restrictions   Precautions Precautions: Fall     Mobility Bed Mobility                General bed mobility comments: Pt seated in the chair at the start of the session.    Transfers Overall transfer level: Needs assistance Equipment used: Rolling walker (2 wheels) Transfers: Sit to/from Stand Sit to Stand: Contact guard assist           General transfer comment: Pt demosntrated some labored effort at times for sit to stand, but no physical assist needed.      Balance Overall balance assessment: Needs assistance Sitting-balance support: No upper extremity supported, Feet supported Sitting balance-Leahy Scale: Good Sitting balance - Comments: seated in chair   Standing balance support: Bilateral upper extremity supported, During functional activity Standing balance-Leahy Scale: Fair Standing balance comment: w/ RW                           ADL either performed or assessed with clinical judgement   ADL Overall ADL's : Needs assistance/impaired     Grooming: Contact guard assist;Supervision/safety;Standing;Wash/dry hands Grooming Details (indicate cue type and reason): standing at the sink with RW available if needed             Lower Body Dressing: Set up;Sitting/lateral leans Lower Body Dressing Details (indicate cue type and reason): Able to doff and don sock seated in chair without physical assist. Toilet Transfer: Contact guard assist;Rolling walker (2 wheels);Ambulation Toilet Transfer Details (indicate cue type and reason): Chair to toilet and back. Toileting- Clothing Manipulation and Hygiene: Set up;Sitting/lateral lean;Supervision/safety Toileting - Clothing Manipulation Details (indicate cue  type and reason): Pt able to complete peri-care with set up assist to get toilet paper situated for the pt while seated on the toilet.     Functional mobility during ADLs: Contact guard assist;Rolling walker (2 wheels) General ADL Comments: Pt able to ambulate within the room with CGA     Vision Baseline Vision/History: 1 Wears  glasses Ability to See in Adequate Light: 1 Impaired Patient Visual Report: No change from baseline Vision Assessment?: No apparent visual deficits     Perception Perception: Not tested       Praxis Praxis: Not tested       Pertinent Vitals/Pain Pain Assessment Pain Assessment: No/denies pain     Extremity/Trunk Assessment Upper Extremity Assessment Upper Extremity Assessment: Generalized weakness   Lower Extremity Assessment Lower Extremity Assessment: Defer to PT evaluation   Cervical / Trunk Assessment Cervical / Trunk Assessment: Kyphotic   Communication Communication Communication: No apparent difficulties   Cognition Arousal: Alert Behavior During Therapy: WFL for tasks assessed/performed Cognition: No apparent impairments                               Following commands: Intact       Cueing  General Comments   Cueing Techniques: Verbal cues;Tactile cues;Visual cues      Exercises Exercises: Other exercises, General Upper Extremity General Exercises - Upper Extremity Chair Push Up: 10 reps (2 sets of 5 reps with parital standing as well without RW) Other Exercises Other Exercises: Pt completed x5 reps of PNF digonal superior to inferior and inferior to superior bilaterally. Pt also compelted banded retraction/external rotation and banded horizontal abduction. All completed in the chair with the yello band at 2 sets of 5 reps and frequent tactile cuing for form.   Shoulder Instructions      Home Living Family/patient expects to be discharged to:: Private residence Living Arrangements: Alone Available Help at Discharge: Friend(s);Available PRN/intermittently Type of Home: House Home Access: Ramped entrance     Home Layout: Two level;Able to live on main level with bedroom/bathroom     Bathroom Shower/Tub: Walk-in shower;Tub/shower unit   Bathroom Toilet: Handicapped height Bathroom Accessibility: Yes How Accessible: Accessible via  walker Home Equipment: Rolling Walker (2 wheels);Grab bars - toilet;Grab bars - tub/shower          Prior Functioning/Environment Prior Level of Function : Independent/Modified Independent;History of Falls (last six months)             Mobility Comments: Pt reports as household ambulator with RW. Reports she does not do much ambulation outside home, reports at least 10 falls in last 6 months, does not drive, retired 6 years ago (per PT note) ADLs Comments: Reports independence with ADLs, receives assist from friends for iADLs. (per PT note and pt confirmation.)    OT Problem List: Decreased strength;Decreased activity tolerance;Impaired balance (sitting and/or standing)   OT Treatment/Interventions: Self-care/ADL training;Therapeutic exercise;Patient/family education;Balance training;Energy conservation;Therapeutic activities;DME and/or AE instruction      OT Goals(Current goals can be found in the care plan section)   Acute Rehab OT Goals Patient Stated Goal: improve function prior to return home OT Goal Formulation: With patient Time For Goal Achievement: 04/06/24 Potential to Achieve Goals: Good   OT Frequency:  Min 2X/week  End of Session Equipment Utilized During Treatment: Rolling walker (2 wheels)  Activity Tolerance: Patient tolerated treatment well Patient left: in chair;with call bell/phone within reach  OT Visit Diagnosis: Unsteadiness on feet (R26.81);Other abnormalities of gait and mobility (R26.89);Muscle weakness (generalized) (M62.81);History of falling (Z91.81);Repeated falls (R29.6)                Time: 9065-8998 OT Time Calculation (min): 27 min Charges:  OT General Charges $OT Visit: 1 Visit OT Evaluation $OT Eval Low Complexity: 1 Low OT Treatments $Therapeutic Exercise: 8-22 mins  Elliotte Marsalis OT, MOT  Jayson Person 03/23/2024, 12:04 PM

## 2024-03-23 NOTE — Patient Instructions (Signed)

## 2024-03-23 NOTE — Progress Notes (Signed)
 Radiation Oncology         [phone] ________________________________  Name: [name] MRN: [id number]  Date: 03/24/2024  DOB: 01-Jul-1956  Outpatient follow-Up Visit  - Conducted via telephone at patient request.   I spoke with the patient to conduct this consult visit via telephone. The patient was notified in advance and was offered an in person or telemedicine meeting to allow for face to face communication but instead preferred to proceed with a telephone consult.   CC: Koirala, Dibas, MD  Koirala, Dibas, MD  Diagnosis and Prior Radiotherapy:    ICD-10-CM   1. Stage IV adenocarcinoma of lung, right [facility])  C34.91        ==========DELIVERED PLANS==========  First Treatment Date: 2023-08-05 Last Treatment Date: 2023-08-16   Plan Name: Brain_HA Site: Brain Technique: IMRT Mode: Photon Dose Per Fraction: 3 Gy Prescribed Dose (Delivered / Prescribed): 30 Gy / 30 Gy Prescribed Fxs (Delivered / Prescribed): 10 / 10  Brain metastases from Stage IV NSCLC; s/p WBRT with hippocampal sparing completed on 08/16/2023  CHIEF COMPLAINT: Here for follow-up and surveillance of brain cancer and to review the results of her most recent brain MRI.   Narrative:    [name] is a 68 year old female who presents for follow-up in radiation oncology after hippocampal sparing whole brain radiation.  Since we last saw her, patient presented to the ED on 03/20/2024 for AKI secondary to dehydration and poor PO intake. At that time, she was seen by palliative care to assist with goals of care conversation. She was subsequently discharged on 03/23/2024 to [facility] for skilled therapy services with ultimate hope of discharging home.  She underwent MRI of the brain on 03/17/2024 that demonstrated an overall slight decrease in size of the treated lesions in comparison to the prior study in May. She saw Dr. Kendell on 03/18/2024, who recommended continuing with Krazati  given the good  response to treatment recent scans have demonstrated.   Headache: Denies Visual Changes: Denies Hearing Changes: Denies Nausea: Denies Vomiting: Denies Balance or coordination issues: Yes, patient notes some weakness and is very fatigued.  Memory issues: Denies     ALLERGIES:  is allergic to synthroid [levothyroxine sodium], voltaren  [diclofenac  sodium], hydrochlorothiazide, and iodine.  Meds: Current Outpatient Medications  Medication Sig Dispense Refill   adagrasib  (KRAZATI ) 200 MG tablet Take 3 tablets (600 mg total) by mouth 2 (two) times daily. 180 tablet 3   escitalopram  (LEXAPRO ) 10 MG tablet Take 1 tablet (10 mg total) by mouth daily. 30 tablet 1   folic acid  (FOLVITE ) 1 MG tablet Take 1 mg by mouth daily.     lidocaine  (XYLOCAINE ) 2 % solution every 6 (six) hours as needed for mouth pain. Apply topically to mouth for sores     megestrol  (MEGACE ) 40 MG tablet Take 8 tablets (320 mg total) by mouth daily. 240 tablet 0   ondansetron  (ZOFRAN ) 8 MG tablet Take 1 tablet (8 mg total) by mouth every 8 (eight) hours as needed for nausea (nausea and or vomiting). 20 tablet 0   thiamine  (VITAMIN B-1) 100 MG tablet Take 1 tablet (100 mg total) by mouth daily. 30 tablet 1   traZODone  (DESYREL ) 100 MG tablet Take 2 tablets (200 mg total) by mouth at bedtime. 180 tablet 3   triamcinolone  cream (KENALOG ) 0.1 % Apply 1 Application topically 2 (two) times daily. Avoid use on face or private areas 80 g 0   loperamide  (IMODIUM ) 2 MG capsule  Take 2 mg by mouth daily.     magic mouthwash SOLN Take 5 mLs by mouth 3 (three) times daily as needed for mouth pain. Suspension contains equal amounts of Maalox Extra Strength, nystatin, and diphenhydramine . (Patient not taking: Reported on 03/24/2024)     No current facility-administered medications for this encounter.    Physical Findings:   The patient is in no acute distress. Patient is alert and oriented.  vitals were not taken for this visit. .   Limited, due to nature of telephone visit.   ECOG = 2-3 (limited by telephone assessment)  0 - Asymptomatic (Fully active, able to carry on all predisease activities without restriction)  1 - Symptomatic but completely ambulatory (Restricted in physically strenuous activity but ambulatory and able to carry out work of a light or sedentary nature. For example, light housework, office work)  2 - Symptomatic, <50% in bed during the day (Ambulatory and capable of all self care but unable to carry out any work activities. Up and about more than 50% of waking hours)  3 - Symptomatic, >50% in bed, but not bedbound (Capable of only limited self-care, confined to bed or chair 50% or more of waking hours)  4 - Bedbound (Completely disabled. Cannot carry on any self-care. Totally confined to bed or chair)  5 - Death   Raylene MM, Creech RH, Tormey DC, et al. [phone]). Toxicity and response criteria of the [facility]. Am. DOROTHA Bridges. Oncol. 5 (6): 649-55  KPS = 60 (limited by telephone assessment)  100 - Normal; no complaints; no evidence of disease. 90   - Able to carry on normal activity; minor signs or symptoms of disease. 80   - Normal activity with effort; some signs or symptoms of disease. 80   - Cares for self; unable to carry on normal activity or to do active work. 60   - Requires occasional assistance, but is able to care for most of his personal needs. 50   - Requires considerable assistance and frequent medical care. 40   - Disabled; requires special care and assistance. 30   - Severely disabled; hospital admission is indicated although death not imminent. 20   - Very sick; hospital admission necessary; active supportive treatment necessary. 10   - Moribund; fatal processes progressing rapidly. 0     - Dead  Karnofsky DA, Abelmann WH, Craver LS and Burchenal [facility] [phone]) The use of the nitrogen mustards in the palliative treatment of carcinoma: with particular reference  to bronchogenic carcinoma Cancer 1 634-56     Lab Findings: Lab Results  Component Value Date   WBC 2.7 (L) 03/23/2024   HGB 7.9 (L) 03/23/2024   HCT 22.9 (L) 03/23/2024   MCV 86.1 03/23/2024   PLT 62 (L) 03/23/2024    Radiographic Findings: MR Brain W Wo Contrast Result Date: 03/17/2024 EXAM: MRI BRAIN WITH AND WITHOUT CONTRAST 03/12/2024 05:40:28 PM TECHNIQUE: Multiplanar multisequence MRI of the head/brain was performed with and without the administration of intravenous contrast. COMPARISON: MRI head 12/06/2023. CLINICAL HISTORY: Metastatic disease evaluation; 3 T SRS Protocol. Metastatic cancer to brain [facility]). FINDINGS: BRAIN AND VENTRICLES: Numerous enhancing intracranial lesions again noted throughout the cerebral hemispheres and cerebellum. The largest lesion in the anterior right frontal lobe now measures up to 8 mm, previously 10 mm (series 1100, image 230). There are at least 20 lesions noted which are annotated on the oblique axial T1 post series 1100. These lesions are overall slightly decreased in  size. There are a few previously noted punctate lesions which are no longer visualized. For example, the previously noted left occipital lesion seen on series 601, image 214 and the right frontal lesion seen on series 601, image 115 of the 12/06/2023 study are not appreciated on the current study. No new intracranial lesions. No lesions demonstrating definite enlargement. Scattered foci of restricted diffusion are decreased in conspicuity compared to prior. No evidence of acute infarct. No acute intracranial hemorrhage. There are several lesions which demonstrate decreased surrounding T2/FLAIR hyperintensity. Similar periventricular white matter signal abnormality. ORBITS: No acute abnormality. SINUSES: No acute abnormality. BONES AND SOFT TISSUES: Normal bone marrow signal and enhancement. Bilateral mastoid effusions. No acute soft tissue abnormality. IMPRESSION: 1. Numerous enhancing intracranial  lesions throughout the cerebral hemispheres and cerebellum, overall slightly decreased in size compared to prior study. No new lesions or definite enlargement. 2. Largest lesion in the anterior right frontal lobe measures up to 8 mm, previously 10 mm. Electronically signed by: Donnice Mania MD 03/17/2024 09:44 PM EDT RP Workstation: [id number]   CT CHEST ABDOMEN PELVIS WO CONTRAST Result Date: 03/17/2024 CLINICAL DATA:  Non-small cell lung cancer, recurrence/metastatic restaging. * Tracking Code: BO * EXAM: CT CHEST, ABDOMEN AND PELVIS WITHOUT CONTRAST TECHNIQUE: Multidetector CT imaging of the chest, abdomen and pelvis was performed following the standard protocol without IV contrast. RADIATION DOSE REDUCTION: This exam was performed according to the departmental dose-optimization program which includes automated exposure control, adjustment of the mA and/or kV according to patient size and/or use of iterative reconstruction technique. COMPARISON:  Multiple priors including CT January 06, 2024. FINDINGS: CT CHEST FINDINGS Cardiovascular: Right chest Port-A-Cath with tip near the superior cavoatrial junction. Aortic atherosclerosis. Normal size heart. Stable small pericardial effusion. Mediastinum/Nodes: No suspicious thyroid  nodule. Decreased size of the subpectoral and axillary lymph nodes. Previously indexed lymph node in the right axilla now measures 17 x 9 mm on image 11/2 previously 30 x 20 mm. No suspicious thyroid  nodule.  Esophagus is grossly unremarkable. Lungs/Pleura: Similar surgical changes of right upper lobectomy without new suspicious nodularity along the suture line. Stable 2 mm left upper lobe pulmonary nodule on image 41/6. Stable 2 mm left lower lobe pulmonary nodule on image 65/6. No new suspicious pulmonary nodules or masses. Musculoskeletal: Stable sclerotic lesion in the T11 vertebral body. No new aggressive lytic or blastic lesion of bone. CT ABDOMEN PELVIS FINDINGS Hepatobiliary: No  suspicious hepatic lesion on noncontrast enhanced examination. Gallbladder is unremarkable. No biliary ductal dilation. Pancreas: No pancreatic ductal dilation or evidence of acute inflammation. Pancreatic atrophy. Spleen: No splenomegaly. Hypodense splenic lesions not well seen on noncontrast enhanced examination. Adrenals/Urinary Tract: Decreased size of the bilateral adrenal nodules. For reference: -left adrenal nodule measures 12 x 11 mm on image 59/2 previously 2.6 x 2.2 cm -right adrenal nodule measures 10 x 7 mm on image 57/2 previously 2.2 x 1.3 cm. No hydronephrosis. Punctate nonobstructive left renal stone. Urinary bladder is minimally distended. Unchanged 12 mm bladder calculus. Stomach/Bowel: No radiopaque enteric contrast material was administered. Stomach is unremarkable for degree of distension. No pathologic dilation of small or large bowel. Pancolonic diverticulosis. Vascular/Lymphatic: Aortic atherosclerosis. Smooth IVC contours. No pathologically enlarged abdominal or pelvic lymph nodes. Reproductive: Status post hysterectomy. No adnexal masses. Other: No significant abdominopelvic free fluid. Musculoskeletal: Multilevel degenerative change of the spine. IMPRESSION: 1. Decreased size of the bilateral adrenal nodules and axillary/subpectoral lymph nodes. Compatible with treatment response. 2. Stable sclerotic lesion in the T11 vertebral body. 3. No  evidence of new or progressive disease in the chest, abdomen or pelvis. 4. Stable tiny pulmonary nodules. 5.  Aortic Atherosclerosis (ICD10-I70.0). Electronically Signed   By: Reyes Holder M.D.   On: 03/17/2024 16:06     Impression/Plan:  Left-handed woman with h/o  Brain metastases from Stage IV NSCLC; s/p WBRT completed on 08/16/2023 - good disease control, per recent MRI imaging  Patient is left-handed.   We personally reviewed the results of her most recent MRI which demonstrate a positive response to her treatment. Most recent systemic  scan also demonstrates an improvement in her disease. Patient is continuing Krazati , per Dr. Jeannett recommendations.   Of note, she was recently admitted for dehydration and poor PO intake and subsequently discharged to a SNF for rehabilitation with the ultimate goal to get her discharged back home. She is feeling quite fatigued and continues to experience generalized weakness. We discussed goals of care today and the role of Hospice and palliative care. Patient expressed understanding, but she would like to continue to remain aggressive with her treatment at this time. Order has been placed for outpatient palliative care referral. This has yet to be scheduled.   Memantine  given through 01-20-24, then stopped per CE.7 protocol. We will reach out to the research team for recommendations on MRI follow-up.    This encounter was conducted via telephone.  The patient has provided two factor identification and has given verbal consent for this type of encounter and has been advised to only accept a meeting of this type in a secure network environment.  The time spent during this encounter was 30 minutes including preparation, discussion, and coordination of the patient's care  The attendants for this meeting include Leeroy Due PA-C and patient. During the encounter, Leeroy Due PA-C was located at [facility] Radiation Oncology Department.  Patient was located at home    _____________________________________    Leeroy Due, PA-C

## 2024-03-23 NOTE — Plan of Care (Signed)
  Problem: Health Behavior/Discharge Planning: Goal: Ability to manage health-related needs will improve Outcome: Progressing   Problem: Metabolic: Goal: Ability to maintain appropriate glucose levels will improve Outcome: Progressing   Problem: Clinical Measurements: Goal: Will remain free from infection Outcome: Progressing   Problem: Coping: Goal: Level of anxiety will decrease Outcome: Progressing   Problem: Pain Managment: Goal: General experience of comfort will improve and/or be controlled Outcome: Progressing

## 2024-03-23 NOTE — Plan of Care (Signed)
  Problem: Acute Rehab OT Goals (only OT should resolve) Goal: Pt. Will Perform Grooming Flowsheets (Taken 03/23/2024 1208) Pt Will Perform Grooming:  with modified independence  standing Goal: Pt. Will Transfer To Toilet Flowsheets (Taken 03/23/2024 1208) Pt Will Transfer to Toilet:  with modified independence  ambulating Goal: Pt. Will Perform Toileting-Clothing Manipulation Flowsheets (Taken 03/23/2024 1208) Pt Will Perform Toileting - Clothing Manipulation and hygiene: with modified independence Goal: Pt/Caregiver Will Perform Home Exercise Program Flowsheets (Taken 03/23/2024 1208) Pt/caregiver will Perform Home Exercise Program:  Increased strength  Both right and left upper extremity  Independently  Kobie Matkins OT, MOT

## 2024-03-23 NOTE — Care Management Important Message (Signed)
 Important Message  Patient Details  Name: [name] MRN: [id number] Date of Birth: January 26, 1956   Important Message Given:  Yes - Medicare IM     Kaye Luoma L Jannice Beitzel 03/23/2024, 11:05 AM

## 2024-03-23 NOTE — Progress Notes (Signed)
 [name] presents for follow-up in radiation oncology and to review the results of her most recent brain MRI with Leeroy Due PA-C.     They completed their radiation on: 08/16/2023  Does the patient complain of any of the following: Headache: Denies Visual Changes: Denies Hearing Changes: Denies Nausea: Denies Vomiting: Denies Balance or coordination issues: Yes Memory issues: Denies  Is the patient currently on a Decadron  regimen? : No  Additional comments if applicable:    No vitals were taken for this visit.

## 2024-03-23 NOTE — Progress Notes (Signed)
 Need to place order for amb outpatient pall care follow up through [facility] for GOC/symptom management: Nausea/vomiting/decreased appetite. See Inpatient pall note from 03/23/24 for more details.

## 2024-03-24 ENCOUNTER — Encounter: Payer: Self-pay | Admitting: Radiology

## 2024-03-24 ENCOUNTER — Ambulatory Visit
Admission: RE | Admit: 2024-03-24 | Discharge: 2024-03-24 | Disposition: A | Discharge status: Home / Self Care | Payer: Self-pay | Source: Ambulatory Visit | Attending: Radiology | Admitting: Radiology

## 2024-03-24 ENCOUNTER — Telehealth: Payer: Self-pay | Admitting: Medical Oncology

## 2024-03-24 DIAGNOSIS — C3491 Malignant neoplasm of unspecified part of right bronchus or lung: Secondary | ICD-10-CM

## 2024-03-24 NOTE — Telephone Encounter (Signed)
 Pt resides at [facility] re[facility].  I clarified Krazati  dose with Delon ( [facility])  and faxed last progress note  to [place] at [facility] rehab.

## 2024-03-26 ENCOUNTER — Telehealth: Payer: Self-pay | Admitting: Medical Oncology

## 2024-03-26 NOTE — Telephone Encounter (Signed)
 Faxed pt calendar of appts  to [facility] rehab for port flush appts through October 20.

## 2024-03-31 ENCOUNTER — Encounter: Payer: Self-pay | Admitting: Internal Medicine

## 2024-04-01 ENCOUNTER — Telehealth: Payer: Self-pay

## 2024-04-01 NOTE — Telephone Encounter (Signed)
 Research RN rec'd care from patient's HCPOA George. He reports patient is increasingly confused and is no longer remembering Facetime calls or weekend-long visits from friends. Zachary wants to maximize patient's quality of life for whatever time is left and would like to speak to Dr Jeannett office asap re stopping po chemo and moving to residential hospice.   I sent a secure chat to Diane Bell with Dr Sherrod requesting she call Zachary asap at [phone].  Andrea MORTON Elaina Cara, RN, BSN, [facility]  Her  Hers Clinical Research Nurse [facility] Direct Dial [phone] 04/01/2024 9:45 AM

## 2024-04-02 ENCOUNTER — Telehealth: Payer: Self-pay | Admitting: Medical Oncology

## 2024-04-02 NOTE — Telephone Encounter (Addendum)
 Pt update and Krazati  compliance- Upon speaking with Tamisha, her voice was noted to be very weak. She denied experiencing pain during the conversation. She reported feeling nauseated and constipated. Elma told me  she has been taking her Krazati  as prescribed. However, her friend Verneita mentioned that Saralynn refused to take her Krazati  this morning due to feeling sick.   I contacted Robin, DON of the facility and did med reconciliation. It was noted that Larsen refused her Krazati  morning dose today and has missed two evening doses in the past two days.Hayleen has been consistently taking Zofran  BID for nausea.She confirmed that pt is currently prescribed and taking the following medications:Krazati ,Megace ,Thiamine ,Folic Acid ,[facility]  I provided Kelee with her next appointment information. She inquired if it was local, and I informed her that it is in [place] with Dr. Sherrod. She responded, Well, as long as it is local.  Mychart video visit scheduled for 2 weeks.

## 2024-04-05 ENCOUNTER — Emergency Department (HOSPITAL_COMMUNITY)
Admission: EM | Admit: 2024-04-05 | Discharge: 2024-04-06 | Disposition: A | Discharge status: Home / Self Care | Attending: Emergency Medicine | Admitting: Emergency Medicine

## 2024-04-05 ENCOUNTER — Other Ambulatory Visit: Payer: Self-pay

## 2024-04-05 ENCOUNTER — Emergency Department (HOSPITAL_COMMUNITY)

## 2024-04-05 DIAGNOSIS — R52 Pain, unspecified: Secondary | ICD-10-CM

## 2024-04-05 DIAGNOSIS — C801 Malignant (primary) neoplasm, unspecified: Secondary | ICD-10-CM

## 2024-04-05 DIAGNOSIS — L89152 Pressure ulcer of sacral region, stage 2: Secondary | ICD-10-CM | POA: Insufficient documentation

## 2024-04-05 DIAGNOSIS — R109 Unspecified abdominal pain: Secondary | ICD-10-CM | POA: Diagnosis present

## 2024-04-05 DIAGNOSIS — C3490 Malignant neoplasm of unspecified part of unspecified bronchus or lung: Secondary | ICD-10-CM | POA: Insufficient documentation

## 2024-04-05 LAB — CBC WITH DIFFERENTIAL/PLATELET
Abs Immature Granulocytes: 0.14 K/uL — ABNORMAL HIGH (ref 0.00–0.07)
Basophils Absolute: 0 K/uL (ref 0.0–0.1)
Basophils Relative: 0 %
Eosinophils Absolute: 0 K/uL (ref 0.0–0.5)
Eosinophils Relative: 0 %
HCT: 25.8 % — ABNORMAL LOW (ref 36.0–46.0)
Hemoglobin: 8.8 g/dL — ABNORMAL LOW (ref 12.0–15.0)
Immature Granulocytes: 2 %
Lymphocytes Relative: 10 %
Lymphs Abs: 0.6 K/uL — ABNORMAL LOW (ref 0.7–4.0)
MCH: 28.9 pg (ref 26.0–34.0)
MCHC: 34.1 g/dL (ref 30.0–36.0)
MCV: 84.9 fL (ref 80.0–100.0)
Monocytes Absolute: 0.5 K/uL (ref 0.1–1.0)
Monocytes Relative: 8 %
Neutro Abs: 4.6 K/uL (ref 1.7–7.7)
Neutrophils Relative %: 80 %
Platelets: 100 K/uL — ABNORMAL LOW (ref 150–400)
RBC: 3.04 MIL/uL — ABNORMAL LOW (ref 3.87–5.11)
RDW: 14.3 % (ref 11.5–15.5)
WBC: 5.8 K/uL (ref 4.0–10.5)
nRBC: 0 % (ref 0.0–0.2)

## 2024-04-05 LAB — BASIC METABOLIC PANEL WITH GFR
Anion gap: 10 (ref 5–15)
BUN: 42 mg/dL — ABNORMAL HIGH (ref 8–23)
CO2: 21 mmol/L — ABNORMAL LOW (ref 22–32)
Calcium: 8.2 mg/dL — ABNORMAL LOW (ref 8.9–10.3)
Chloride: 107 mmol/L (ref 98–111)
Creatinine, Ser: 1.64 mg/dL — ABNORMAL HIGH (ref 0.44–1.00)
GFR, Estimated: 33 mL/min — ABNORMAL LOW (ref >60)
Glucose, Bld: 135 mg/dL — ABNORMAL HIGH (ref 70–99)
Potassium: 3.8 mmol/L (ref 3.5–5.1)
Sodium: 138 mmol/L (ref 135–145)

## 2024-04-05 MED ORDER — MORPHINE SULFATE (PF) 4 MG/ML IV SOLN
4.0000 mg | Freq: Once | INTRAVENOUS | Status: AC
  Administered 2024-04-05: 4 mg via INTRAVENOUS
  Filled 2024-04-05: qty 1

## 2024-04-05 MED ORDER — SODIUM CHLORIDE 0.9 % IV BOLUS
1000.0000 mL | Freq: Once | INTRAVENOUS | Status: AC
  Administered 2024-04-05: 1000 mL via INTRAVENOUS

## 2024-04-05 MED ORDER — MORPHINE SULFATE (PF) 4 MG/ML IV SOLN
2.0000 mg | INTRAVENOUS | Status: DC | PRN
  Administered 2024-04-06: 2 mg via INTRAVENOUS
  Filled 2024-04-05: qty 1

## 2024-04-05 NOTE — ED Triage Notes (Signed)
 Pt is cancer pt who states she has pain, but cannot tell me where it hurts.

## 2024-04-05 NOTE — ED Provider Notes (Signed)
 [place] EMERGENCY DEPARTMENT AT [facility] Provider Note   CSN: [id number] Arrival date & time: 04/05/24  1850     Patient presents with: Pain Management   [name] is a 68 y.o. female.   Patient sent to the nursing home from the facility where she resides.  Patient has been complaining of pain.  Facility reports that patient does not have any pain medication ordered.  Patient has advanced adenocarcinoma of her lung.  Patient reports that the Krazati  that she takes makes her have abdominal pain.  Patient is followed by Dr. Gatha.  Patient's has friends who are here with her they report that the patient has had a substantial weight loss.  They report that patient seems to be getting weaker and weaker at the facility she is not eating or drinking.  Patient's power of attorney Zachary advised he does not want patient to return to the facility that she is in.  He is requesting patient go to hospice at [facility] in [place] .  The history is provided by the patient, the nursing home and a friend. No language interpreter was used.       Prior to Admission medications   Medication Sig Start Date End Date Taking? Authorizing Provider  adagrasib  (KRAZATI ) 200 MG tablet Take 3 tablets (600 mg total) by mouth 2 (two) times daily. 01/13/24   Sherrod Sherrod, MD  escitalopram  (LEXAPRO ) 10 MG tablet Take 1 tablet (10 mg total) by mouth daily. 03/24/24   Shahmehdi, Adriana LABOR, MD  folic acid  (FOLVITE ) 1 MG tablet Take 1 mg by mouth daily. 03/20/24   [provider]  lidocaine  (XYLOCAINE ) 2 % solution every 6 (six) hours as needed for mouth pain. Apply topically to mouth for sores 11/06/23   [provider]  loperamide  (IMODIUM ) 2 MG capsule Take 2 mg by mouth daily. 01/13/24   [provider]  magic mouthwash SOLN Take 5 mLs by mouth 3 (three) times daily as needed for mouth pain. Suspension contains equal amounts of Maalox Extra Strength, nystatin, and  diphenhydramine . Patient not taking: Reported on 03/24/2024    [provider]  megestrol  (MEGACE ) 40 MG tablet Take 8 tablets (320 mg total) by mouth daily. 03/23/24   ShahmehdiAdriana LABOR, MD  ondansetron  (ZOFRAN ) 8 MG tablet Take 1 tablet (8 mg total) by mouth every 8 (eight) hours as needed for nausea (nausea and or vomiting). 03/19/24   Sherrod Sherrod, MD  thiamine  (VITAMIN B-1) 100 MG tablet Take 1 tablet (100 mg total) by mouth daily. 03/24/24   Shahmehdi, Adriana LABOR, MD  traZODone  (DESYREL ) 100 MG tablet Take 2 tablets (200 mg total) by mouth at bedtime. 10/25/23   Okey Barnie SAUNDERS, MD  triamcinolone  cream (KENALOG ) 0.1 % Apply 1 Application topically 2 (two) times daily. Avoid use on face or private areas 02/27/24   Stuart Vernell Norris, PA-C    Allergies: Synthroid [levothyroxine sodium], Voltaren  [diclofenac  sodium], Hydrochlorothiazide, and Iodine    Review of Systems  Constitutional:  Positive for activity change, appetite change and fatigue.  All other systems reviewed and are negative.   Updated Vital Signs BP 129/71   Pulse 84   Temp 98 F (36.7 C) (Oral)   Resp 16   Ht 5' 7 (1.702 m)   Wt 63.9 kg   SpO2 97%   BMI 22.06 kg/m   Physical Exam Vitals and nursing note reviewed.  Constitutional:      Appearance: She is well-developed. She  is ill-appearing.     Comments: Thin, frail appearing  HENT:     Head: Normocephalic.  Cardiovascular:     Rate and Rhythm: Normal rate.  Pulmonary:     Effort: Pulmonary effort is normal.  Abdominal:     General: There is no distension.  Musculoskeletal:        General: Normal range of motion.     Cervical back: Normal range of motion.  Skin:    General: Skin is warm.  Neurological:     General: No focal deficit present.     Mental Status: She is alert.     (all labs ordered are listed, but only abnormal results are displayed) Labs Reviewed  CBC WITH DIFFERENTIAL/PLATELET - Abnormal; Notable for the following  components:      Result Value   RBC 3.04 (*)    Hemoglobin 8.8 (*)    HCT 25.8 (*)    Platelets 100 (*)    Lymphs Abs 0.6 (*)    Abs Immature Granulocytes 0.14 (*)    All other components within normal limits  BASIC METABOLIC PANEL WITH GFR    EKG: None  Radiology: No results found.   Procedures   Medications Ordered in the ED  sodium chloride  0.9 % bolus 1,000 mL (1,000 mLs Intravenous New Bag/Given 04/05/24 2000)  morphine  (PF) 4 MG/ML injection 4 mg (4 mg Intravenous Given 04/05/24 1959)                                    Medical Decision Making Patient currently resides at the [facility] and evening.  Patient was sent to the emergency department due to abdominal pain.  Patient has stage IV adenocarcinoma she is no longer eating or drinking.  Patient has been having abdominal pain and is not on any medications for pain.  Amount and/or Complexity of Data Reviewed Labs: ordered. Decision-making details documented in ED Course. Radiology: ordered.  Risk Risk Details: Transitions of care consulted to help with placement at hospice.        Final diagnoses:  Acute pain  Adenocarcinoma (HCC)  Pressure injury of sacral region, stage 2 [facility])    ED Discharge Orders     None          Flint Sonny MARLA DEVONNA 04/05/24 2236    Cleotilde Rogue, MD 04/05/24 2237

## 2024-04-05 NOTE — ED Notes (Signed)
 Phone was taken home by Verneita.

## 2024-04-06 ENCOUNTER — Telehealth: Payer: Self-pay | Admitting: Medical Oncology

## 2024-04-06 ENCOUNTER — Encounter: Payer: Self-pay | Admitting: *Deleted

## 2024-04-06 DIAGNOSIS — R109 Unspecified abdominal pain: Secondary | ICD-10-CM | POA: Diagnosis not present

## 2024-04-06 DIAGNOSIS — C3490 Malignant neoplasm of unspecified part of unspecified bronchus or lung: Secondary | ICD-10-CM

## 2024-04-06 NOTE — Telephone Encounter (Signed)
 Returned a call to [facility]), who reported that the patient is currently in the [facility] ED for severe abdominal pain and is declining. Zachary stated that both the ED provider and social worker discussed the possibility of referring the patient to [facility] in [place] .  Patient was evaluated by [facility] last week; however, based on that evaluation, my understanding is that they were unable to accept her as a resident.  Per Dr. Sherrod, if the patient and her POA are in agreement with pursuing hospice care, he supports them proceeding with a referral.

## 2024-04-06 NOTE — ED Notes (Signed)
 Pt cleaned up after BM, hair and face washed, teeth brushed, A/o, VSS. No complaints

## 2024-04-06 NOTE — ED Notes (Signed)
 POA updated regarding patient's transfer. Report given to myanmar at [facility]

## 2024-04-06 NOTE — ED Notes (Signed)
 Care team member is in with patient at this time

## 2024-04-06 NOTE — ED Notes (Signed)
 Attempted to call for transportation twice, no answer. VM left.

## 2024-04-06 NOTE — ED Provider Notes (Addendum)
 Emergency Medicine Observation Re-evaluation Note  [name] is a 68 y.o. female, seen on rounds today.  Pt initially presented to the ED for complaints of Pain Management Currently, the patient is resting comfortably. Patient has history of advanced cancer.  She is in the emergency room because of pain, and family has requested that patient be attempted to be placed at a hospice home.  TOC consult has been put in, awaiting their recommendation.  Per nursing staff, no acute issues overnight.  Patient has as needed morphine  ordered.  Physical Exam  BP 118/82   Pulse 64   Temp 98 F (36.7 C) (Oral)   Resp 16   Ht 5' 7 (1.702 m)   Wt 63.9 kg   SpO2 90%   BMI 22.06 kg/m  Physical Exam General: No acute distress  ED Course / MDM  EKG:   I have reviewed the labs performed to date as well as medications administered while in observation.  Recent changes in the last 24 hours include -awaiting TOC evaluation and recommendation for placement to hospice.  Plan  Current plan is for pain control.    Charlyn Sora, MD 04/06/24 [phone]   1:51 PM accepted to [facility], MD 04/06/24 1351

## 2024-04-06 NOTE — Research (Unsigned)
 TRIAL A PHASE III TRIAL OF STEREOTACTIC RADIOSURGERY COMPARED WITH HIPPOCAMPAL-AVOIDANT WHOLE BRAIN RADIOTHERAPY (HA-WBRT) PLUS MEMANTINE  FOR 5 OR MORE BRAIN METASTASES   ADVERSE EVENT LOG:  [name] [id number]   04/06/2024   Adverse Event Log   Study/Protocol: CE.7  Hospital emergency room visit AE's reviewed for reporting purposes. Pt was in the emergency room from 04/05/24-04/06/24 for observation for evaluation of abdominal pain control, skin ulceration, and evaluation for hospice. The pt's emergency room visit was considered serious, unexpected and unrelated to the pt's study treatment by the study PI, Dr. Izell. No SAE reporting is required.    Event Grade Onset Date Attribution to Study Intervention Attribution to Study Intervention Treatment  Neoplasms benign, malignant and unspecified (incl cysts and polyps)- others  3 04/05/24 unrelated   Unexpected    Abdominal Pain   3 04/05/24 unrelated   Unexpected Morphine  pain medication as needed  Skin ulceration    2 04/05/24 unrelated   Unexpected    Pt was discharged from the ER and transferred to [facility] house in [place] . MD supported pt's family decision to have hospice care. Will continue to monitor pt's condition and will report per study protocol.   Mazie Larsen, RN, BSN Clinical Research Nurse [phone] 04/06/2024

## 2024-04-06 NOTE — ED Notes (Signed)
 Kwante with [facility] called this Clinical research associate and shared that patient was accepted to [facility]. Number for report was given to patient nurse to call. CSW asked nurse to arrange transportation. Zachary was updated. ICM signing off.

## 2024-04-06 NOTE — ED Notes (Signed)
 Transition of Care [facility]) - Emergency Department Mini Assessment   Patient Details  Name: [name] MRN: [id number] Date of Birth: [birth year]  [facility] CM/SW Contact:    Noreen KATHEE Pinal, LCSWA Phone Number: 04/06/2024, 9:05 AM   Clinical Narrative:  CSW reviewed patient chart and seen that patient was here last month and was DC to [facility] for rehab. CSW called teresa first and then was directed to called Zachary - who is POA about consult for [facility]. Zachary stated that the plans are for her to go there as long as they have a bed. Zachary did share that [facility] was already familiar with patient. CSW sent referral over and Dorina called stating that patient was already active with them and if plans has changed with patient stopping her treatment. CSW shared that based on conversation with Zachary that was case and advised Kwante to give him a call to confirm. Kwante did share that they had a bed. ICM will continue to follow.   ED Mini Assessment: What brought you to the Emergency Department? : Hospice referral - unable to manage at home  Barriers to Discharge: Other (must enter comment) (ED- Hospice referral for [facility])  Barrier interventions: Hospice referral for [facility] of departure: Ambulance  Interventions which prevented an admission or readmission: Hospice    Patient Contact and Communications Key Contact 1: Verneita Key Contact 2: Jenney Sermon with: Alyse Pass Date: 04/06/24,   Contact time: [id number] Contact Phone Number: [phone] and [phone] Call outcome: Hospice referral for [facility] or go to [facility] with Hospice  Patient states their goals for this hospitalization and ongoing recovery are:: Hospice CMS Medicare.gov Compare Post Acute Care list provided to:: Other (Comment Required) (Friend) Choice offered to / list presented to :  (friend)  Admission diagnosis:  - Patient Active Problem List    Diagnosis Date Noted   AKI (acute kidney injury) (HCC) 03/20/2024   History of pulmonary embolism 03/20/2024   Hyponatremia 03/20/2024   Anemia 12/12/2023   Syncope 11/13/2023   Pancytopenia (HCC) 11/13/2023   Thrombocytopenia (HCC) 11/13/2023   Type 2 diabetes mellitus with hyperglycemia (HCC) 11/13/2023   Encounter for antineoplastic immunotherapy 09/11/2023   Encounter for antineoplastic chemotherapy 09/11/2023   Port-A-Cath in place 08/21/2023   Metastatic cancer to brain (HCC) 07/23/2023   GERD (gastroesophageal reflux disease) 12/04/2022   Chronic pain 01/02/2021   Decreased estrogen level 01/02/2021   Exostosis of skull 01/02/2021   Insomnia 01/02/2021   Long term (current) use of insulin  (HCC) 01/02/2021   Moderate recurrent major depression (HCC) 01/02/2021   Pure hypercholesterolemia 01/02/2021   Ventricular premature beats 01/02/2021   Obesity 07/15/2019   History of colonic polyps 03/09/2019   Microcytic anemia 03/09/2019   Dysphagia 03/09/2019   Stage IV adenocarcinoma of lung, right (HCC) 11/29/2016   S/P lobectomy of lung 10/15/2016   Lung nodule 10/02/2016   Adrenal adenoma, right 10/02/2016   Essential hypertension 05/31/2016   Increased frequency of urination 07/16/2014   Depression 10/13/2013   Fractured lateral malleolus 08/12/2012   Left knee sprain 08/12/2012   PCP:  Regino Slater, MD Pharmacy:   [facility] [phone] - [place], [place] - [address] AT [facility] [address] [place] [address] Phone: [phone] Fax: [phone]  [facility] - [place], [place] - [facility] AT [facility] to [facility] [address] [place] [address] Phone: [phone]  Fax: [phone]

## 2024-04-08 ENCOUNTER — Other Ambulatory Visit: Payer: Self-pay

## 2024-04-08 NOTE — Progress Notes (Signed)
 Disenrolling - per chart notes from 9.2.25-9.8.25 patient is transitioning to hospice.

## 2024-04-14 ENCOUNTER — Inpatient Hospital Stay

## 2024-04-14 ENCOUNTER — Other Ambulatory Visit

## 2024-04-14 ENCOUNTER — Inpatient Hospital Stay: Admitting: Internal Medicine

## 2024-04-15 ENCOUNTER — Telehealth: Payer: Self-pay | Admitting: Radiation Oncology

## 2024-04-15 NOTE — Telephone Encounter (Signed)
 9/17 Left voicemail for patient, so she is aware we cancel her f/u appt on 10/20.

## 2024-04-29 DEATH — deceased

## 2024-05-01 ENCOUNTER — Other Ambulatory Visit: Payer: Self-pay | Admitting: Physician Assistant

## 2024-05-11 ENCOUNTER — Encounter: Payer: Self-pay | Admitting: Internal Medicine

## 2024-05-18 ENCOUNTER — Inpatient Hospital Stay

## 2024-05-18 ENCOUNTER — Inpatient Hospital Stay: Attending: Internal Medicine

## 2024-05-18 ENCOUNTER — Ambulatory Visit: Admitting: Radiation Oncology

## 2024-08-26 ENCOUNTER — Ambulatory Visit: Admitting: Nurse Practitioner
